# Patient Record
Sex: Female | Born: 1970 | Race: White | Hispanic: No | Marital: Married | Smoking: Former smoker
Health system: Southern US, Community
[De-identification: ages and names within clinical notes are randomized; demographics above are authoritative.]

## PROBLEM LIST (undated history)

## (undated) DIAGNOSIS — Z87442 Personal history of urinary calculi: Secondary | ICD-10-CM

## (undated) DIAGNOSIS — D649 Anemia, unspecified: Secondary | ICD-10-CM

## (undated) DIAGNOSIS — M1990 Unspecified osteoarthritis, unspecified site: Secondary | ICD-10-CM

## (undated) DIAGNOSIS — L719 Rosacea, unspecified: Secondary | ICD-10-CM

## (undated) DIAGNOSIS — U071 COVID-19: Secondary | ICD-10-CM

## (undated) DIAGNOSIS — R42 Dizziness and giddiness: Secondary | ICD-10-CM

## (undated) DIAGNOSIS — N183 Chronic kidney disease, stage 3 unspecified: Secondary | ICD-10-CM

## (undated) DIAGNOSIS — G43019 Migraine without aura, intractable, without status migrainosus: Secondary | ICD-10-CM

## (undated) DIAGNOSIS — I499 Cardiac arrhythmia, unspecified: Secondary | ICD-10-CM

## (undated) DIAGNOSIS — E039 Hypothyroidism, unspecified: Secondary | ICD-10-CM

## (undated) DIAGNOSIS — H269 Unspecified cataract: Secondary | ICD-10-CM

## (undated) DIAGNOSIS — Z8489 Family history of other specified conditions: Secondary | ICD-10-CM

## (undated) DIAGNOSIS — Z9884 Bariatric surgery status: Secondary | ICD-10-CM

## (undated) DIAGNOSIS — I10 Essential (primary) hypertension: Secondary | ICD-10-CM

## (undated) DIAGNOSIS — M797 Fibromyalgia: Secondary | ICD-10-CM

## (undated) DIAGNOSIS — E785 Hyperlipidemia, unspecified: Secondary | ICD-10-CM

## (undated) DIAGNOSIS — Z8742 Personal history of other diseases of the female genital tract: Secondary | ICD-10-CM

## (undated) DIAGNOSIS — T753XXA Motion sickness, initial encounter: Secondary | ICD-10-CM

## (undated) DIAGNOSIS — F419 Anxiety disorder, unspecified: Secondary | ICD-10-CM

## (undated) DIAGNOSIS — N809 Endometriosis, unspecified: Secondary | ICD-10-CM

## (undated) DIAGNOSIS — N200 Calculus of kidney: Secondary | ICD-10-CM

## (undated) DIAGNOSIS — F329 Major depressive disorder, single episode, unspecified: Secondary | ICD-10-CM

## (undated) HISTORY — DX: Endometriosis, unspecified: N80.9

## (undated) HISTORY — DX: Morbid (severe) obesity due to excess calories: E66.01

## (undated) HISTORY — DX: Calculus of kidney: N20.0

## (undated) HISTORY — PX: ROTATOR CUFF REPAIR: SHX139

## (undated) HISTORY — DX: COVID-19: U07.1

## (undated) HISTORY — DX: Rosacea, unspecified: L71.9

## (undated) HISTORY — DX: Dizziness and giddiness: R42

## (undated) HISTORY — DX: Major depressive disorder, single episode, unspecified: F32.9

## (undated) HISTORY — PX: EYE SURGERY: SHX253

## (undated) HISTORY — DX: Migraine without aura, intractable, without status migrainosus: G43.019

## (undated) HISTORY — DX: Bariatric surgery status: Z98.84

## (undated) HISTORY — PX: OTHER SURGICAL HISTORY: SHX169

## (undated) HISTORY — DX: Anemia, unspecified: D64.9

## (undated) HISTORY — DX: Anxiety disorder, unspecified: F41.9

## (undated) HISTORY — PX: OVARIAN CYST REMOVAL: SHX89

## (undated) HISTORY — DX: Hyperlipidemia, unspecified: E78.5

## (undated) HISTORY — DX: Unspecified cataract: H26.9

## (undated) HISTORY — DX: Personal history of other diseases of the female genital tract: Z87.42

---

## 2006-12-05 ENCOUNTER — Ambulatory Visit: Payer: Self-pay | Admitting: Internal Medicine

## 2006-12-11 ENCOUNTER — Ambulatory Visit: Payer: Self-pay | Admitting: Internal Medicine

## 2007-01-11 ENCOUNTER — Ambulatory Visit: Payer: Self-pay | Admitting: Internal Medicine

## 2007-02-09 ENCOUNTER — Ambulatory Visit: Payer: Self-pay | Admitting: Internal Medicine

## 2007-03-12 ENCOUNTER — Ambulatory Visit: Payer: Self-pay | Admitting: Internal Medicine

## 2007-03-26 ENCOUNTER — Ambulatory Visit: Payer: Self-pay | Admitting: Internal Medicine

## 2007-03-29 ENCOUNTER — Ambulatory Visit: Payer: Self-pay | Admitting: Urology

## 2007-03-29 ENCOUNTER — Observation Stay: Payer: Self-pay | Admitting: Urology

## 2007-04-01 ENCOUNTER — Ambulatory Visit: Payer: Self-pay | Admitting: Urology

## 2007-04-04 ENCOUNTER — Ambulatory Visit: Payer: Self-pay | Admitting: Urology

## 2007-04-11 ENCOUNTER — Ambulatory Visit: Payer: Self-pay | Admitting: Internal Medicine

## 2007-04-25 ENCOUNTER — Ambulatory Visit: Payer: Self-pay | Admitting: Internal Medicine

## 2007-05-12 ENCOUNTER — Ambulatory Visit: Payer: Self-pay | Admitting: Internal Medicine

## 2007-06-11 ENCOUNTER — Ambulatory Visit: Payer: Self-pay | Admitting: Internal Medicine

## 2007-06-27 ENCOUNTER — Ambulatory Visit: Payer: Self-pay | Admitting: Internal Medicine

## 2007-07-12 ENCOUNTER — Ambulatory Visit: Payer: Self-pay | Admitting: Internal Medicine

## 2007-08-12 ENCOUNTER — Ambulatory Visit: Payer: Self-pay | Admitting: Internal Medicine

## 2007-08-26 ENCOUNTER — Ambulatory Visit: Payer: Self-pay | Admitting: Internal Medicine

## 2007-08-28 ENCOUNTER — Ambulatory Visit: Payer: Self-pay | Admitting: Unknown Physician Specialty

## 2007-09-11 ENCOUNTER — Ambulatory Visit: Payer: Self-pay | Admitting: Internal Medicine

## 2007-10-12 ENCOUNTER — Ambulatory Visit: Payer: Self-pay | Admitting: Internal Medicine

## 2007-12-19 ENCOUNTER — Ambulatory Visit: Payer: Self-pay | Admitting: Rheumatology

## 2007-12-20 ENCOUNTER — Ambulatory Visit: Payer: Self-pay | Admitting: Internal Medicine

## 2008-01-12 ENCOUNTER — Ambulatory Visit: Payer: Self-pay | Admitting: Internal Medicine

## 2008-01-20 ENCOUNTER — Ambulatory Visit: Payer: Self-pay | Admitting: Internal Medicine

## 2008-02-09 ENCOUNTER — Ambulatory Visit: Payer: Self-pay | Admitting: Internal Medicine

## 2008-03-11 ENCOUNTER — Ambulatory Visit: Payer: Self-pay | Admitting: Internal Medicine

## 2008-03-12 ENCOUNTER — Ambulatory Visit: Payer: Self-pay | Admitting: Orthopaedic Surgery

## 2008-03-12 ENCOUNTER — Other Ambulatory Visit: Payer: Self-pay

## 2008-03-19 ENCOUNTER — Ambulatory Visit: Payer: Self-pay | Admitting: Orthopaedic Surgery

## 2008-06-10 ENCOUNTER — Ambulatory Visit: Payer: Self-pay | Admitting: Internal Medicine

## 2008-06-16 ENCOUNTER — Ambulatory Visit: Payer: Self-pay | Admitting: Internal Medicine

## 2008-07-02 ENCOUNTER — Encounter: Payer: Self-pay | Admitting: Maternal & Fetal Medicine

## 2008-07-11 ENCOUNTER — Ambulatory Visit: Payer: Self-pay | Admitting: Internal Medicine

## 2008-08-11 ENCOUNTER — Ambulatory Visit: Payer: Self-pay | Admitting: Internal Medicine

## 2008-12-11 DIAGNOSIS — F32A Depression, unspecified: Secondary | ICD-10-CM

## 2008-12-11 DIAGNOSIS — Z9884 Bariatric surgery status: Secondary | ICD-10-CM

## 2008-12-11 HISTORY — DX: Depression, unspecified: F32.A

## 2008-12-11 HISTORY — DX: Bariatric surgery status: Z98.84

## 2009-05-08 ENCOUNTER — Emergency Department: Payer: Self-pay

## 2009-05-11 ENCOUNTER — Ambulatory Visit: Payer: Self-pay | Admitting: Internal Medicine

## 2009-06-09 ENCOUNTER — Ambulatory Visit: Payer: Self-pay | Admitting: Internal Medicine

## 2009-06-10 ENCOUNTER — Ambulatory Visit: Payer: Self-pay | Admitting: Internal Medicine

## 2009-11-22 ENCOUNTER — Ambulatory Visit: Payer: Self-pay | Admitting: Internal Medicine

## 2009-12-11 HISTORY — PX: DILATION AND CURETTAGE OF UTERUS: SHX78

## 2010-01-20 ENCOUNTER — Ambulatory Visit: Payer: Self-pay | Admitting: Unknown Physician Specialty

## 2010-01-28 ENCOUNTER — Ambulatory Visit: Payer: Self-pay | Admitting: Unknown Physician Specialty

## 2010-05-25 ENCOUNTER — Emergency Department: Payer: Self-pay | Admitting: Emergency Medicine

## 2011-05-04 ENCOUNTER — Ambulatory Visit: Payer: Self-pay

## 2011-05-07 ENCOUNTER — Emergency Department: Payer: Self-pay | Admitting: Emergency Medicine

## 2011-08-28 ENCOUNTER — Other Ambulatory Visit: Payer: Self-pay | Admitting: *Deleted

## 2011-09-04 ENCOUNTER — Other Ambulatory Visit: Payer: Self-pay | Admitting: Internal Medicine

## 2011-09-06 MED ORDER — TRAMADOL HCL 50 MG PO TABS: 50.0000 mg | ORAL_TABLET | Freq: Four times a day (QID) | ORAL | Status: DC | PRN

## 2011-09-14 ENCOUNTER — Ambulatory Visit: Payer: Self-pay | Admitting: Unknown Physician Specialty

## 2011-11-06 ENCOUNTER — Other Ambulatory Visit: Payer: Self-pay | Admitting: Internal Medicine

## 2011-11-06 MED ORDER — PROMETHAZINE HCL 25 MG RE SUPP: 25.0000 mg | Freq: Three times a day (TID) | RECTAL | Status: DC | PRN

## 2011-11-06 NOTE — Telephone Encounter (Signed)
Refill authorizied on promethazine

## 2011-11-27 ENCOUNTER — Other Ambulatory Visit: Payer: Self-pay | Admitting: *Deleted

## 2011-11-28 MED ORDER — TRAMADOL HCL 50 MG PO TABS: 50.0000 mg | ORAL_TABLET | Freq: Four times a day (QID) | ORAL | Status: DC | PRN

## 2011-12-09 ENCOUNTER — Ambulatory Visit: Payer: Self-pay | Admitting: Internal Medicine

## 2011-12-25 ENCOUNTER — Other Ambulatory Visit: Payer: Self-pay | Admitting: *Deleted

## 2011-12-25 MED ORDER — METHOCARBAMOL 500 MG PO TABS: 500.0000 mg | ORAL_TABLET | Freq: Three times a day (TID) | ORAL | Status: AC

## 2011-12-25 NOTE — Telephone Encounter (Signed)
Faxed request from [facility], last filled 06/26/11.

## 2012-01-02 ENCOUNTER — Other Ambulatory Visit: Payer: Self-pay | Admitting: *Deleted

## 2012-01-02 NOTE — Telephone Encounter (Signed)
She is overdue for cmet, fasting lipids and hgba1c.  30 day supply only. If we have contact information

## 2012-01-02 NOTE — Telephone Encounter (Signed)
Faxed request from [facility].  Pt has not been seen in this office and has no upcoming appts.

## 2012-01-03 MED ORDER — METFORMIN HCL 500 MG PO TABS: 500.0000 mg | ORAL_TABLET | Freq: Three times a day (TID) | ORAL | Status: DC

## 2012-01-03 NOTE — Telephone Encounter (Signed)
Medicine called to [facility].  Advised pharmacist that pt will need office visit before further refills.  Left message on the only number we have in her chart for her to call back.

## 2012-01-22 ENCOUNTER — Telehealth: Payer: Self-pay | Admitting: Internal Medicine

## 2012-01-22 DIAGNOSIS — E119 Type 2 diabetes mellitus without complications: Secondary | ICD-10-CM | POA: Insufficient documentation

## 2012-01-22 MED ORDER — METFORMIN HCL 500 MG PO TABS: 500.0000 mg | ORAL_TABLET | Freq: Three times a day (TID) | ORAL | Status: DC

## 2012-01-22 NOTE — Telephone Encounter (Signed)
Needing another refill on her metformin 500 mg until her office visit on March 6.

## 2012-01-22 NOTE — Telephone Encounter (Signed)
Addended by: Duncan Dull on: 01/22/2012 01:15 PM   Modules accepted: Orders

## 2012-02-05 ENCOUNTER — Encounter: Payer: Self-pay | Admitting: Internal Medicine

## 2012-02-05 ENCOUNTER — Ambulatory Visit (INDEPENDENT_AMBULATORY_CARE_PROVIDER_SITE_OTHER): Payer: BC Managed Care – PPO | Admitting: Internal Medicine

## 2012-02-05 DIAGNOSIS — G4733 Obstructive sleep apnea (adult) (pediatric): Secondary | ICD-10-CM | POA: Insufficient documentation

## 2012-02-05 DIAGNOSIS — G43909 Migraine, unspecified, not intractable, without status migrainosus: Secondary | ICD-10-CM

## 2012-02-05 DIAGNOSIS — G4730 Sleep apnea, unspecified: Secondary | ICD-10-CM

## 2012-02-05 DIAGNOSIS — E119 Type 2 diabetes mellitus without complications: Secondary | ICD-10-CM

## 2012-02-05 MED ORDER — HYDROCODONE-ACETAMINOPHEN 7.5-750 MG PO TABS: 1.0000 | ORAL_TABLET | Freq: Four times a day (QID) | ORAL | Status: AC | PRN

## 2012-02-05 MED ORDER — VERAPAMIL HCL ER 120 MG PO TBCR: 120.0000 mg | EXTENDED_RELEASE_TABLET | Freq: Every day | ORAL | Status: DC

## 2012-02-05 MED ORDER — METFORMIN HCL 500 MG PO TABS: 1000.0000 mg | ORAL_TABLET | Freq: Two times a day (BID) | ORAL | Status: DC

## 2012-02-05 MED ORDER — METOPROLOL TARTRATE 50 MG PO TABS: 50.0000 mg | ORAL_TABLET | Freq: Two times a day (BID) | ORAL | Status: DC

## 2012-02-05 NOTE — Assessment & Plan Note (Signed)
Persistent  With no prior trial of verapamil.  Will first increase the metoprolol tartrate to twice daily and if no improvement,  Decrease to once daily and add bedtime verapamil.

## 2012-02-05 NOTE — Progress Notes (Signed)
Subjective:    Patient ID: Maria Bentley, female    DOB: [birth year], 41 y.o.   MRN: [id number]  HPI 41 yr old white female with tory of  DM, chronic migraines, morbid obesity s/p gastric bypass  Presents with persistent migraines. She has tried some  preventive therapy via neurologist Dohmeier,  Current migraine has been continuous for one week.  The imitrex works temporarily but wears off in 6 hours .  Previous trial of propranolol failed.  Discussed topomax.   Previous tral of topomax required dose titration so high that  it caused word finding difficulties. To make matters more complicated, she is undergoing fertility treatments with her husband to conceive at [facility].   History reviewed. No pertinent past medical history. Current Outpatient Prescriptions on File Prior to Visit  Medication Sig Dispense Refill  . traMADol (ULTRAM) 50 MG tablet Take 1 tablet (50 mg total) by mouth every 6 (six) hours as needed for pain.  90 tablet  3    Review of Systems  Constitutional: Negative for fever, chills and unexpected weight change.  HENT: Negative for hearing loss, ear pain, nosebleeds, congestion, sore throat, facial swelling, rhinorrhea, sneezing, mouth sores, trouble swallowing, neck pain, neck stiffness, voice change, postnasal drip, sinus pressure, tinnitus and ear discharge.   Eyes: Negative for pain, discharge, redness and visual disturbance.  Respiratory: Negative for cough, chest tightness, shortness of breath, wheezing and stridor.   Cardiovascular: Negative for chest pain, palpitations and leg swelling.  Musculoskeletal: Negative for myalgias and arthralgias.  Skin: Negative for color change and rash.  Neurological: Positive for headaches. Negative for dizziness, weakness and light-headedness.  Hematological: Negative for adenopathy.      Objective:   Physical Exam  Constitutional: She is oriented to person, place, and time. She appears well-developed and  well-nourished.  HENT:  Mouth/Throat: Oropharynx is clear and moist.  Eyes: EOM are normal. Pupils are equal, round, and reactive to light. No scleral icterus.  Neck: Normal range of motion. Neck supple. No JVD present. No thyromegaly present.  Cardiovascular: Normal rate, regular rhythm, normal heart sounds and intact distal pulses.   Pulmonary/Chest: Effort normal and breath sounds normal.  Abdominal: Soft. Bowel sounds are normal. She exhibits no mass. There is no tenderness.  Musculoskeletal: Normal range of motion. She exhibits no edema.  Lymphadenopathy:    She has no cervical adenopathy.  Neurological: She is alert and oriented to person, place, and time.  Skin: Skin is warm and dry.  Psychiatric: She has a normal mood and affect.      Assessment & Plan:   Diabetes mellitus Last a1c n spet was < 7.0.   checking today,  taking metformin 1500 mg daily   Migraine Persistent  With no prior trial of verapamil.  Will first increase the metoprolol tartrate to twice daily and if no improvement,  Decrease to once daily and add bedtime verapamil.    Updated Medication List Outpatient Encounter Prescriptions as of 02/05/2012  Medication Sig Dispense Refill  . acetaminophen (TYLENOL) 325 MG tablet Take 650 mg by mouth every 6 (six) hours as needed.      Marland Kitchen buPROPion (WELLBUTRIN XL) 300 MG 24 hr tablet Take 300 mg by mouth daily.      . Cholecalciferol (VITAMIN D3) 2000 UNITS TABS Take by mouth.      . folic acid (FOLVITE) 1 MG tablet Take 1 mg by mouth daily.      . furosemide (LASIX) 20 MG tablet Take  20 mg by mouth 2 (two) times daily.      Marland Kitchen gabapentin (NEURONTIN) 300 MG capsule Take 300 mg by mouth 3 (three) times daily.      Marland Kitchen lamoTRIgine (LAMICTAL) 150 MG tablet Take 150 mg by mouth daily.      . meclizine (ANTIVERT) 25 MG tablet Take 25 mg by mouth 3 (three) times daily as needed.      . metFORMIN (GLUCOPHAGE) 500 MG tablet Take 2 tablets (1,000 mg total) by mouth 2 (two) times  daily with a meal.  120 tablet  2  . methocarbamol (ROBAXIN) 500 MG tablet Take 500 mg by mouth 4 (four) times daily.      . metoprolol (LOPRESSOR) 50 MG tablet Take 1 tablet (50 mg total) by mouth 2 (two) times daily.  60 tablet  2  . Prenatal Vit-Fe Fumarate-FA (PRENATAL MULTIVITAMIN) TABS Take 1 tablet by mouth daily.      . SUMAtriptan (IMITREX) 100 MG tablet Take 100 mg by mouth every 2 (two) hours as needed.      . traMADol (ULTRAM) 50 MG tablet Take 1 tablet (50 mg total) by mouth every 6 (six) hours as needed for pain.  90 tablet  3  . DISCONTD: metFORMIN (GLUCOPHAGE) 500 MG tablet Take 1 tablet (500 mg total) by mouth 3 (three) times daily.  90 tablet  2  . DISCONTD: metoprolol (LOPRESSOR) 50 MG tablet Take 50 mg by mouth 2 (two) times daily.      Marland Kitchen HYDROcodone-acetaminophen (VICODIN ES) 7.5-750 MG per tablet Take 1 tablet by mouth every 6 (six) hours as needed for pain.  60 tablet  0  . verapamil (CALAN-SR) 120 MG CR tablet Take 1 tablet (120 mg total) by mouth at bedtime.  30 tablet  2

## 2012-02-05 NOTE — Assessment & Plan Note (Addendum)
Last a1c n spet was < 7.0.   checking today,  taking metformin 1500 mg daily

## 2012-02-05 NOTE — Patient Instructions (Addendum)
We are trying ES vicodin and increasing your memtoprolol to two times daily for your headaches and blood pressure..  If there is no  Difference in one week to your headaches you may return to once daily on the metoprolol and start the bedtime  verapamil for prevention  Sign up for E chart tonight so i can lease hgba1c to you

## 2012-02-06 LAB — COMPLETE METABOLIC PANEL WITHOUT GFR
ALT: 15 U/L (ref 0–35)
AST: 14 U/L (ref 0–37)
Albumin: 4.3 g/dL (ref 3.5–5.2)
Alkaline Phosphatase: 70 U/L (ref 39–117)
BUN: 11 mg/dL (ref 6–23)
CO2: 28 meq/L (ref 19–32)
Calcium: 10 mg/dL (ref 8.4–10.5)
Chloride: 100 meq/L (ref 96–112)
Creat: 0.76 mg/dL (ref 0.50–1.10)
GFR, Est African American: >89 mL/min
GFR, Est Non African American: >89 mL/min
Glucose, Bld: 111 mg/dL — ABNORMAL HIGH (ref 70–99)
Potassium: 4.4 meq/L (ref 3.5–5.3)
Sodium: 139 meq/L (ref 135–145)
Total Bilirubin: 0.3 mg/dL (ref 0.3–1.2)
Total Protein: 6.8 g/dL (ref 6.0–8.3)

## 2012-02-06 LAB — HEMOGLOBIN A1C: Hgb A1c MFr Bld: 8.7 % — ABNORMAL HIGH (ref 4.6–6.5)

## 2012-02-07 MED ORDER — GLIPIZIDE 5 MG PO TABS: 5.0000 mg | ORAL_TABLET | Freq: Two times a day (BID) | ORAL | Status: DC

## 2012-02-07 NOTE — Progress Notes (Signed)
Addended by: Duncan Dull on: 02/07/2012 10:56 PM   Modules accepted: Orders

## 2012-02-14 ENCOUNTER — Ambulatory Visit (INDEPENDENT_AMBULATORY_CARE_PROVIDER_SITE_OTHER): Payer: BC Managed Care – PPO | Admitting: Internal Medicine

## 2012-02-14 ENCOUNTER — Encounter: Payer: Self-pay | Admitting: Internal Medicine

## 2012-02-14 DIAGNOSIS — G43909 Migraine, unspecified, not intractable, without status migrainosus: Secondary | ICD-10-CM

## 2012-02-14 DIAGNOSIS — E119 Type 2 diabetes mellitus without complications: Secondary | ICD-10-CM

## 2012-02-14 MED ORDER — GABAPENTIN 300 MG PO CAPS: 300.0000 mg | ORAL_CAPSULE | Freq: Three times a day (TID) | ORAL | Status: DC

## 2012-02-14 MED ORDER — POTASSIUM CHLORIDE ER 10 MEQ PO TBCR: 20.0000 meq | EXTENDED_RELEASE_TABLET | Freq: Every day | ORAL | Status: DC

## 2012-02-14 MED ORDER — FLUCONAZOLE 150 MG PO TABS: 150.0000 mg | ORAL_TABLET | Freq: Every day | ORAL | Status: AC

## 2012-02-14 MED ORDER — METHOCARBAMOL 500 MG PO TABS: 500.0000 mg | ORAL_TABLET | Freq: Four times a day (QID) | ORAL | Status: DC

## 2012-02-14 MED ORDER — SUMATRIPTAN SUCCINATE 100 MG PO TABS: 100.0000 mg | ORAL_TABLET | ORAL | Status: DC | PRN

## 2012-02-14 MED ORDER — TRAMADOL HCL 50 MG PO TABS: 50.0000 mg | ORAL_TABLET | Freq: Four times a day (QID) | ORAL | Status: DC | PRN

## 2012-02-14 MED ORDER — METRONIDAZOLE 0.75 % EX CREA: TOPICAL_CREAM | Freq: Two times a day (BID) | CUTANEOUS | Status: AC

## 2012-02-14 MED ORDER — MECLIZINE HCL 25 MG PO TABS: 25.0000 mg | ORAL_TABLET | Freq: Three times a day (TID) | ORAL | Status: DC | PRN

## 2012-02-14 MED ORDER — PROMETHAZINE HCL 25 MG RE SUPP: 25.0000 mg | Freq: Four times a day (QID) | RECTAL | Status: DC | PRN

## 2012-02-14 MED ORDER — METOPROLOL SUCCINATE ER 50 MG PO TB24: 50.0000 mg | ORAL_TABLET | Freq: Every day | ORAL | Status: DC

## 2012-02-14 MED ORDER — FUROSEMIDE 20 MG PO TABS: 20.0000 mg | ORAL_TABLET | Freq: Every day | ORAL | Status: DC | PRN

## 2012-02-14 NOTE — Progress Notes (Signed)
Subjective:     Maria Bentley is a 41 y.o. female and is here for a comprehensive physical exam. The patient reports improvement in headaches since last week with addition of Cardizem.  She had a normal PAP smear and breast exam by her OBGYN in November 2012.   History   Social History  . Marital Status: Married    Spouse Name: N/A    Number of Children: N/A  . Years of Education: N/A   Occupational History  . Not on file.   Social History Main Topics  . Smoking status: Former Smoker    Quit date: 02/05/1996  . Smokeless tobacco: Never Used  . Alcohol Use: No  . Drug Use: No  . Sexually Active: Not on file   Other Topics Concern  . Not on file   Social History Narrative  . No narrative on file   Health Maintenance  Topic Date Due  . Pap Smear  07/15/1988  . Tetanus/tdap  07/15/1989  . Influenza Vaccine  09/11/2011    The following portions of the patient's history were reviewed and updated as appropriate: allergies, current medications, past family history, past medical history, past social history, past surgical history and problem list.  Review of Systems A comprehensive review of systems was negative.   Objective:    BP 120/80  Pulse 98  Temp(Src) 98 F (36.7 C) (Oral)  Resp 18  Ht 5\' 4"  (1.626 m)  Wt 198 lb 12 oz (90.152 kg)  BMI 34.12 kg/m2  SpO2 95%  LMP 02/05/2012 General appearance: alert, cooperative and appears stated age Head: Normocephalic, without obvious abnormality, atraumatic Eyes: conjunctivae/corneas clear. PERRL, EOM's intact. Fundi benign. Neck: no adenopathy, no carotid bruit, no JVD, supple, symmetrical, trachea midline and thyroid not enlarged, symmetric, no tenderness/mass/nodules Back: symmetric, no curvature. ROM normal. No CVA tenderness. Lungs: clear to auscultation bilaterally Heart: regular rate and rhythm, S1, S2 normal, no murmur, click, rub or gallop Abdomen: soft, non-tender; bowel sounds normal; no masses,  no  organomegaly Pulses: 2+ and symmetric Skin: Skin color, texture, turgor normal. No rashes or lesions Neurologic: Alert and oriented X 3, normal strength and tone. Normal symmetric reflexes. Normal coordination and gait    Assessment:    Healthy female exam.    Plan:     Migraine So far improved with addition of calcium channel blocker.  History of prior high dose Topamax caused memory loss and word finding difficulties.   Diabetes mellitus Recent loss of control as evidenced by HgbAic discussed. Discussed low glycemic index diet and use of 6 smaller meals daily , which is already in place due to gastric bypass  History.    Updated Medication List Outpatient Encounter Prescriptions as of 02/14/2012  Medication Sig Dispense Refill  . acetaminophen (TYLENOL) 325 MG tablet Take 650 mg by mouth every 6 (six) hours as needed.      Marland Kitchen buPROPion (WELLBUTRIN XL) 300 MG 24 hr tablet Take 300 mg by mouth daily.      . Cholecalciferol (VITAMIN D3) 2000 UNITS TABS Take by mouth.      . folic acid (FOLVITE) 1 MG tablet Take 1 mg by mouth daily.      . furosemide (LASIX) 20 MG tablet Take 1 tablet (20 mg total) by mouth daily as needed.  90 tablet  3  . gabapentin (NEURONTIN) 300 MG capsule Take 1 capsule (300 mg total) by mouth 3 (three) times daily.  90 capsule  3  . glipiZIDE (  GLUCOTROL) 5 MG tablet Take 1 tablet (5 mg total) by mouth 2 (two) times daily before a meal.  60 tablet  3  . HYDROcodone-acetaminophen (VICODIN ES) 7.5-750 MG per tablet Take 1 tablet by mouth every 6 (six) hours as needed for pain.  60 tablet  0  . lamoTRIgine (LAMICTAL) 150 MG tablet Take 150 mg by mouth daily.      . meclizine (ANTIVERT) 25 MG tablet Take 1 tablet (25 mg total) by mouth 3 (three) times daily as needed.  90 tablet  6  . metFORMIN (GLUCOPHAGE) 500 MG tablet Take 2 tablets (1,000 mg total) by mouth 2 (two) times daily with a meal.  120 tablet  2  . methocarbamol (ROBAXIN) 500 MG tablet Take 1 tablet (500  mg total) by mouth 4 (four) times daily.  90 tablet  5  . Prenatal Vit-Fe Fumarate-FA (PRENATAL MULTIVITAMIN) TABS Take 1 tablet by mouth daily.      . SUMAtriptan (IMITREX) 100 MG tablet Take 1 tablet (100 mg total) by mouth every 2 (two) hours as needed.  9 tablet  11  . traMADol (ULTRAM) 50 MG tablet Take 1 tablet (50 mg total) by mouth every 6 (six) hours as needed for pain.  90 tablet  3  . verapamil (CALAN-SR) 120 MG CR tablet Take 1 tablet (120 mg total) by mouth at bedtime.  30 tablet  2  . DISCONTD: furosemide (LASIX) 20 MG tablet Take 20 mg by mouth 2 (two) times daily.      Marland Kitchen DISCONTD: gabapentin (NEURONTIN) 300 MG capsule Take 300 mg by mouth 3 (three) times daily.      Marland Kitchen DISCONTD: meclizine (ANTIVERT) 25 MG tablet Take 25 mg by mouth 3 (three) times daily as needed.      Marland Kitchen DISCONTD: methocarbamol (ROBAXIN) 500 MG tablet Take 500 mg by mouth 4 (four) times daily.      Marland Kitchen DISCONTD: metoprolol (LOPRESSOR) 50 MG tablet Take 1 tablet (50 mg total) by mouth 2 (two) times daily.  60 tablet  2  . DISCONTD: SUMAtriptan (IMITREX) 100 MG tablet Take 100 mg by mouth every 2 (two) hours as needed.      Marland Kitchen DISCONTD: traMADol (ULTRAM) 50 MG tablet Take 1 tablet (50 mg total) by mouth every 6 (six) hours as needed for pain.  90 tablet  3  . fluconazole (DIFLUCAN) 150 MG tablet Take 1 tablet (150 mg total) by mouth daily. For maximum 3 days as needed  9 tablet  3  . metoprolol succinate (TOPROL-XL) 50 MG 24 hr tablet Take 1 tablet (50 mg total) by mouth daily. Take with or immediately following a meal.  90 tablet  3  . metroNIDAZOLE (METROCREAM) 0.75 % cream Apply topically 2 (two) times daily.  45 g  3  . potassium chloride (K-DUR) 10 MEQ tablet Take 2 tablets (20 mEq total) by mouth daily.  90 tablet  3  . promethazine (PHENERGAN) 25 MG suppository Place 1 suppository (25 mg total) rectally every 6 (six) hours as needed for nausea.  12 each  3

## 2012-02-14 NOTE — Patient Instructions (Signed)
Low glycemic index diet, eating 6 smaller meals daily  Protein  Shakes (EAS Carb Control  Or Atkins ,  Available everywhere,   In  cases at [employer] )  2.5 carbs  Protein bar by Atkins (snack size,  [employer])  At 10 am  A couple of solutions to the bread :    Joseph's makes a pita bread and a flat bread ,  available at [facility] and [employer];  50 cal ,   4 net carbs )   Toufayan makes a high fiber low carb wrap

## 2012-02-15 NOTE — Assessment & Plan Note (Signed)
Recent loss of control as evidenced by HgbAic discussed. Discussed low glycemic index diet and use of 6 smaller meals daily , which is already in place due to gastric bypass  History.

## 2012-02-15 NOTE — Assessment & Plan Note (Signed)
So far improved with addition of calcium channel blocker.  History of prior high dose Topamax caused memory loss and word finding difficulties.

## 2012-02-21 ENCOUNTER — Encounter: Payer: Self-pay | Admitting: Internal Medicine

## 2012-02-26 ENCOUNTER — Telehealth: Payer: Self-pay | Admitting: *Deleted

## 2012-02-26 NOTE — Telephone Encounter (Signed)
Triage Record Num: [id number] Operator: Chevis Pretty Patient Name: Maria Bentley Call Date & Time: 02/26/2012 9:23:15AM Patient Phone: [phone] PCP: Duncan Dull Patient Gender: Female PCP Fax : [phone] Patient DOB: [birth year] Practice Name: [facility] Reason for Call: Caller: Shayda/Patient; PCP: Duncan Dull; CB#: [phone]; ; ; Call regarding Earache, Tonsils Swollen; Cold Symtpoms X. 4. Days; afebrile. States gland is swollen on the L side of the neck as well. L outer ear is mildly swollen and red. Per protocol, advised appt within 4 hours; no appts available per Epic. TC to office; referred to urgent care. Patient declines UC, as her copay is too expensive; appt sched 0915 02/27/12 with Dr. Darrick Huntsman. Protocol(s) Used: Ear: Symptoms Recommended Outcome per Protocol: See Provider within 4 hours Reason for Outcome: Swelling, tenderness, OR redness of visible portion of outer ear Care Advice: A warm washcloth or heating pad set on low to the affected ear may help relieve the discomfort. May apply for 15 to 20 minutes, 3 to 4 times a day. ~ ~ Call provider if symptoms worsen or new symptoms develop. During pregnancy, call provider if temperature is 100 F (37.7 C) or greater OR any temperature elevation for 3 days even while taking acetaminophen. ~ ~ Do not use eardrops unless directed by a healthcare provider, especially if having ear drainage. Keep ear canal as dry as possible. Take a bath instead of showering. Try to keep water out of ear when washing hair by placing cotton balls in ear opening. Avoid swimming or water sports until [place] by provider. ~ ~ When ear is draining, wipe away the material as it drains. Do not try to clean ear canal. ~ SYMPTOM / CONDITION MANAGEMENT ~ CAUTIONS Analgesic/Antipyretic Advice - Acetaminophen: Consider acetaminophen as directed on label or by pharmacist/provider for pain or fever PRECAUTIONS: - Use if there  is no history of liver disease, alcoholism, or intake of three or more alcohol drinks per day - Only if approved by provider during pregnancy or when breastfeeding - During pregnancy, acetaminophen should not be taken more than 3 consecutive days without telling provider - Do not exceed recommended dose or frequency ~ Analgesic/Antipyretic Advice - NSAIDs: Consider aspirin, ibuprofen, naproxen or ketoprofen for pain or fever as directed on label or by pharmacist/provider. PRECAUTIONS: - If over 57 years of age, should not take longer than 1 week without consulting provider. EXCEPTIONS: - Should not be used if taking blood thinners or have bleeding problems. - Do not use if have history of sensitivity/allergy to any of these medications; or history of cardiovascular, ulcer, kidney, liver disease or diabetes unless approved by provider. - Do not exceed recommended dose or frequency. ~ 02/26/2012 9:40:21AM Page 1 of 1 [id number]

## 2012-02-27 ENCOUNTER — Ambulatory Visit (INDEPENDENT_AMBULATORY_CARE_PROVIDER_SITE_OTHER): Payer: BC Managed Care – PPO | Admitting: Internal Medicine

## 2012-02-27 ENCOUNTER — Encounter: Payer: Self-pay | Admitting: Internal Medicine

## 2012-02-27 VITALS — BP 130/90 | HR 94 | Temp 98.1°F | Wt 198.4 lb

## 2012-02-27 DIAGNOSIS — E669 Obesity, unspecified: Secondary | ICD-10-CM | POA: Insufficient documentation

## 2012-02-27 DIAGNOSIS — E1169 Type 2 diabetes mellitus with other specified complication: Secondary | ICD-10-CM | POA: Insufficient documentation

## 2012-02-27 DIAGNOSIS — H6692 Otitis media, unspecified, left ear: Secondary | ICD-10-CM | POA: Insufficient documentation

## 2012-02-27 DIAGNOSIS — J329 Chronic sinusitis, unspecified: Secondary | ICD-10-CM

## 2012-02-27 DIAGNOSIS — F419 Anxiety disorder, unspecified: Secondary | ICD-10-CM | POA: Insufficient documentation

## 2012-02-27 DIAGNOSIS — E785 Hyperlipidemia, unspecified: Secondary | ICD-10-CM | POA: Insufficient documentation

## 2012-02-27 DIAGNOSIS — G43019 Migraine without aura, intractable, without status migrainosus: Secondary | ICD-10-CM | POA: Insufficient documentation

## 2012-02-27 DIAGNOSIS — H6690 Otitis media, unspecified, unspecified ear: Secondary | ICD-10-CM

## 2012-02-27 DIAGNOSIS — E039 Hypothyroidism, unspecified: Secondary | ICD-10-CM | POA: Insufficient documentation

## 2012-02-27 MED ORDER — HYDROCODONE-ACETAMINOPHEN 10-325 MG PO TABS: 1.0000 | ORAL_TABLET | Freq: Three times a day (TID) | ORAL | Status: AC | PRN

## 2012-02-27 MED ORDER — LEVOFLOXACIN 500 MG PO TABS: 500.0000 mg | ORAL_TABLET | Freq: Every day | ORAL | Status: AC

## 2012-02-27 MED ORDER — CLINDAMYCIN HCL 300 MG PO CAPS: 300.0000 mg | ORAL_CAPSULE | Freq: Three times a day (TID) | ORAL | Status: AC

## 2012-02-27 MED ORDER — METHYLPREDNISOLONE ACETATE 40 MG/ML IJ SUSP
40.0000 mg | Freq: Once | INTRAMUSCULAR | Status: AC
  Administered 2012-02-27: 40 mg via INTRAMUSCULAR

## 2012-02-27 MED ORDER — PREDNISONE (PAK) 10 MG PO TABS: ORAL_TABLET | ORAL | Status: AC

## 2012-02-27 NOTE — Assessment & Plan Note (Signed)
Secondary to prolonged sinus congestion. Will treat with Levaquin and clindamycin given her history of allergy to penicillin. And also started on prednisone given her a dose of IV IM Depo-Medrol here in the office. Also instructed her on how to perform sinus lavage with simply saline sterile solution and use of Sudafed to maintain open airways.

## 2012-02-27 NOTE — Progress Notes (Signed)
Patient ID: Maria Bentley, female   DOB: [birth year], 41 y.o.   MRN: [id number]   Patient Active Problem List  Diagnoses  . Migraine  . Sleep apnea  . Otitis media of left ear  . Diabetes mellitus  . Anxiety  . Headache, common migraine, intractable  . Hyperlipidemia    Subjective:  CC:   Chief Complaint  Patient presents with  . Sinusitis  . Otalgia    left - on set Sunday    HPI:   Maria Barse Timmonsis a 41 y.o. female who presents  With Sinus congestion which started one week ago,  for days ago her symptoms progressed to include maxillary sinus pain and purulent drainage from both nostrils accompanied by left ear pain and hearing loss. She has been taking taking sudafed and mucinex.  Subjective fevers but temp has been normal. Recurring persistent cough worse with  supine position.    Past Medical History  Diagnosis Date  . Diabetes mellitus   . Morbid obesity     s/p gastric bypass  . Anxiety   . Headache, common migraine, intractable   . Hyperlipidemia   . Thyroid disease     Past Surgical History  Procedure Date  . Gastric bypass        . methylPREDNISolone acetate  40 mg Intramuscular Once     The following portions of the patient's history were reviewed and updated as appropriate: Allergies, current medications, and problem list.    Review of Systems:   12 Pt  review of systems was negative except those addressed in the HPI,     History   Social History  . Marital Status: Married    Spouse Name: N/A    Number of Children: N/A  . Years of Education: N/A   Occupational History  . Not on file.   Social History Main Topics  . Smoking status: Former Smoker    Quit date: 02/05/1996  . Smokeless tobacco: Never Used  . Alcohol Use: No  . Drug Use: No  . Sexually Active: Not on file   Other Topics Concern  . Not on file   Social History Narrative  . No narrative on file    Objective:  BP 130/90  Pulse 94  Temp(Src)  98.1 F (36.7 C) (Oral)  Wt 198 lb 6.4 oz (89.994 kg)  LMP 02/05/2012  General appearance: alert, cooperative and appears stated age Ears: normal TM's and external ear canals both ears Throat: lips, mucosa, and tongue normal; teeth and gums normal Neck: no adenopathy, no carotid bruit, supple, symmetrical, trachea midline and thyroid not enlarged, symmetric, no tenderness/mass/nodules Back: symmetric, no curvature. ROM normal. No CVA tenderness. Lungs: clear to auscultation bilaterally Heart: regular rate and rhythm, S1, S2 normal, no murmur, click, rub or gallop Abdomen: soft, non-tender; bowel sounds normal; no masses,  no organomegaly Pulses: 2+ and symmetric Skin: Skin color, texture, turgor normal. No rashes or lesions Lymph nodes: Cervical, supraclavicular, and axillary nodes normal.  Assessment and Plan:  Otitis media of left ear Secondary to prolonged sinus congestion. Will treat with Levaquin and clindamycin given her history of allergy to penicillin. And also started on prednisone given her a dose of IV IM Depo-Medrol here in the office. Also instructed her on how to perform sinus lavage with simply saline sterile solution and use of Sudafed to maintain open airways.    Updated Medication List Outpatient Encounter Prescriptions as of 02/27/2012  Medication Sig Dispense Refill  . acetaminophen (  TYLENOL) 325 MG tablet Take 650 mg by mouth every 6 (six) hours as needed.      Marland Kitchen buPROPion (WELLBUTRIN XL) 300 MG 24 hr tablet Take 300 mg by mouth daily.      . Cholecalciferol (VITAMIN D3) 2000 UNITS TABS Take by mouth.      . folic acid (FOLVITE) 1 MG tablet Take 1 mg by mouth daily.      . furosemide (LASIX) 20 MG tablet Take 1 tablet (20 mg total) by mouth daily as needed.  90 tablet  3  . gabapentin (NEURONTIN) 300 MG capsule Take 1 capsule (300 mg total) by mouth 3 (three) times daily.  90 capsule  3  . glipiZIDE (GLUCOTROL) 5 MG tablet Take 1 tablet (5 mg total) by mouth 2  (two) times daily before a meal.  60 tablet  3  . lamoTRIgine (LAMICTAL) 150 MG tablet Take 150 mg by mouth daily.      . meclizine (ANTIVERT) 25 MG tablet Take 1 tablet (25 mg total) by mouth 3 (three) times daily as needed.  90 tablet  6  . metFORMIN (GLUCOPHAGE) 500 MG tablet Take 2 tablets (1,000 mg total) by mouth 2 (two) times daily with a meal.  120 tablet  2  . methocarbamol (ROBAXIN) 500 MG tablet Take 1 tablet (500 mg total) by mouth 4 (four) times daily.  90 tablet  5  . metoprolol succinate (TOPROL-XL) 50 MG 24 hr tablet Take 1 tablet (50 mg total) by mouth daily. Take with or immediately following a meal.  90 tablet  3  . metroNIDAZOLE (METROCREAM) 0.75 % cream Apply topically 2 (two) times daily.  45 g  3  . potassium chloride (K-DUR) 10 MEQ tablet Take 2 tablets (20 mEq total) by mouth daily.  90 tablet  3  . Prenatal Vit-Fe Fumarate-FA (PRENATAL MULTIVITAMIN) TABS Take 1 tablet by mouth daily.      . SUMAtriptan (IMITREX) 100 MG tablet Take 1 tablet (100 mg total) by mouth every 2 (two) hours as needed.  9 tablet  11  . traMADol (ULTRAM) 50 MG tablet Take 1 tablet (50 mg total) by mouth every 6 (six) hours as needed for pain.  90 tablet  3  . verapamil (CALAN-SR) 120 MG CR tablet Take 1 tablet (120 mg total) by mouth at bedtime.  30 tablet  2  . clindamycin (CLEOCIN) 300 MG capsule Take 1 capsule (300 mg total) by mouth 3 (three) times daily.  21 capsule  0  . HYDROcodone-acetaminophen (NORCO) 10-325 MG per tablet Take 1 tablet by mouth every 8 (eight) hours as needed for pain.  30 tablet  0  . levofloxacin (LEVAQUIN) 500 MG tablet Take 1 tablet (500 mg total) by mouth daily.  7 tablet  0  . predniSONE (STERAPRED UNI-PAK) 10 MG tablet 6 tablets on Day 1 , then reduce by 1 tablet daily until gone  21 tablet  0   Facility-Administered Encounter Medications as of 02/27/2012  Medication Dose Route Frequency Provider Last Rate Last Dose  . methylPREDNISolone acetate (DEPO-MEDROL)  injection 40 mg  40 mg Intramuscular Once Sherlene Shams, MD   40 mg at 02/27/12 1151     No orders of the defined types were placed in this encounter.    No Follow-up on file.

## 2012-02-27 NOTE — Patient Instructions (Signed)
You have a sinus/ear infection   .  I am prescribing an antibiotic (levaquin and clindamcyin) and prednisone taper  To manage the infectin and the inflammation in your ear/sinuses.   I also advise use of the following OTC meds to help with your other symptoms.   Take generic OTC benadryl 25 mg every 8 hours for the drainage,  Sudafed PE  10 to 30 mg every 8 hours for the congestion, you may substitute Afrin nasal spray for the nighttime dose of sudafed PE  If needed to prevent insomnia.  flushes your sinuses twice daily with Simply Saline (do over the sink because if you do it right you will spit out globs of mucus)  Use benzonatate capsules or OTC  Delsym   FOR THE COUGH.  Gargle with salt water as needed for sore throat.   Use vicodin for ear pain

## 2012-04-02 ENCOUNTER — Encounter: Payer: Self-pay | Admitting: Internal Medicine

## 2012-04-11 ENCOUNTER — Other Ambulatory Visit: Payer: Self-pay | Admitting: Internal Medicine

## 2012-04-12 MED ORDER — FLUTICASONE PROPIONATE 50 MCG/ACT NA SUSP: 2.0000 | Freq: Every day | NASAL | Status: DC

## 2012-04-20 ENCOUNTER — Other Ambulatory Visit: Payer: Self-pay | Admitting: Internal Medicine

## 2012-05-21 ENCOUNTER — Telehealth: Payer: Self-pay | Admitting: Internal Medicine

## 2012-05-21 ENCOUNTER — Encounter: Payer: Self-pay | Admitting: Internal Medicine

## 2012-05-21 NOTE — Telephone Encounter (Signed)
Emergent Call: Caller: Maria Bentley/Patient; PCP: Duncan Dull; CB#: [phone]; Call regarding Injury/Trauma; Injured L elbow 05/11/12 by striking elbow against door frame.  Afebrile.  FBS not tested.  Random blood sugar 67; so just took orange juice.  Notes > pain in elbow with movement and difficulty holding onto items d/t pain.  Trying to move at home and at work.  Office is nearly closed.  In [employer].  Advised to see [facility] within 4 hr for severe pain with movement that limits normal activities per Elbow Injury Guideline.

## 2012-05-24 ENCOUNTER — Ambulatory Visit: Payer: BC Managed Care – PPO | Admitting: Internal Medicine

## 2012-05-25 ENCOUNTER — Other Ambulatory Visit: Payer: Self-pay | Admitting: Internal Medicine

## 2012-06-03 ENCOUNTER — Other Ambulatory Visit: Payer: Self-pay | Admitting: Internal Medicine

## 2012-06-03 MED ORDER — GLIPIZIDE 5 MG PO TABS: 5.0000 mg | ORAL_TABLET | Freq: Two times a day (BID) | ORAL | Status: DC

## 2012-06-03 NOTE — Addendum Note (Signed)
Addended by: Duncan Dull on: 06/03/2012 12:42 PM   Modules accepted: Orders

## 2012-06-07 ENCOUNTER — Ambulatory Visit (INDEPENDENT_AMBULATORY_CARE_PROVIDER_SITE_OTHER): Payer: BC Managed Care – PPO | Admitting: Internal Medicine

## 2012-06-07 ENCOUNTER — Encounter: Payer: Self-pay | Admitting: Internal Medicine

## 2012-06-07 VITALS — BP 118/74 | HR 88 | Temp 98.0°F | Resp 16 | Wt 209.0 lb

## 2012-06-07 DIAGNOSIS — E119 Type 2 diabetes mellitus without complications: Secondary | ICD-10-CM

## 2012-06-07 DIAGNOSIS — G43019 Migraine without aura, intractable, without status migrainosus: Secondary | ICD-10-CM

## 2012-06-07 DIAGNOSIS — E1165 Type 2 diabetes mellitus with hyperglycemia: Secondary | ICD-10-CM

## 2012-06-07 DIAGNOSIS — IMO0002 Reserved for concepts with insufficient information to code with codable children: Secondary | ICD-10-CM

## 2012-06-07 DIAGNOSIS — IMO0001 Reserved for inherently not codable concepts without codable children: Secondary | ICD-10-CM

## 2012-06-07 DIAGNOSIS — E785 Hyperlipidemia, unspecified: Secondary | ICD-10-CM

## 2012-06-07 DIAGNOSIS — Z9884 Bariatric surgery status: Secondary | ICD-10-CM

## 2012-06-07 DIAGNOSIS — E669 Obesity, unspecified: Secondary | ICD-10-CM

## 2012-06-07 DIAGNOSIS — E1169 Type 2 diabetes mellitus with other specified complication: Secondary | ICD-10-CM

## 2012-06-07 DIAGNOSIS — E039 Hypothyroidism, unspecified: Secondary | ICD-10-CM

## 2012-06-07 LAB — MAGNESIUM: Magnesium: 1.8 mg/dL (ref 1.5–2.5)

## 2012-06-07 LAB — LIPID PANEL
Cholesterol: 209 mg/dL — ABNORMAL HIGH (ref 0–200)
Total CHOL/HDL Ratio: 4
Triglycerides: 102 mg/dL (ref 0.0–149.0)
VLDL: 20.4 mg/dL (ref 0.0–40.0)

## 2012-06-07 LAB — LDL CHOLESTEROL, DIRECT: Direct LDL: 138.8 mg/dL

## 2012-06-07 LAB — HEMOGLOBIN A1C: Hgb A1c MFr Bld: 7.5 % — ABNORMAL HIGH (ref 4.6–6.5)

## 2012-06-07 MED ORDER — HYDROCODONE-ACETAMINOPHEN 5-500 MG PO TABS: 1.0000 | ORAL_TABLET | Freq: Four times a day (QID) | ORAL | Status: DC | PRN

## 2012-06-07 MED ORDER — METOPROLOL SUCCINATE ER 50 MG PO TB24: 50.0000 mg | ORAL_TABLET | Freq: Every day | ORAL | Status: DC

## 2012-06-07 MED ORDER — EPINEPHRINE 0.3 MG/0.3ML IJ DEVI: 0.3000 mg | Freq: Once | INTRAMUSCULAR | Status: DC

## 2012-06-07 MED ORDER — SYRINGE (DISPOSABLE) 3 ML MISC: Status: AC

## 2012-06-07 MED ORDER — SAXAGLIPTIN-METFORMIN ER 2.5-1000 MG PO TB24: ORAL_TABLET | ORAL | Status: DC

## 2012-06-07 MED ORDER — CYANOCOBALAMIN 1000 MCG/ML IJ SOLN: 1000.0000 ug | Freq: Once | INTRAMUSCULAR | Status: DC

## 2012-06-07 MED ORDER — GLUCOSE BLOOD VI STRP: ORAL_STRIP | Status: DC

## 2012-06-07 NOTE — Progress Notes (Signed)
Patient ID: Maria Bentley, female   DOB: 28-Oct-1971, 41 y.o.   MRN: [id number]  Patient Active Problem List  Diagnosis  . Migraine  . Sleep apnea  . Otitis media of left ear  . Diabetes mellitus type 2 in obese  . Anxiety  . Headache, common migraine, intractable  . Hyperlipidemia  . Obesity (BMI 30-39.9)    Subjective:  CC:   Chief Complaint  Patient presents with  . Follow-up    3 month    HPI:   Maria Boatman Timmonsis a 41 y.o. female who presents 3 month follow up on diabetes mellitus, hyperlipidemia and obesity.  At last visit her hgba1c was elevated beyond gaol of 7.0 and we added glipizide. She had several symptomatic hypoglycemic events and is adjusting her dose based on carb assessment of meal.   She continues to have periodic left lateral elbow pain and has been told she chipper her epicondyle on left arm per x ray from chiropractor Cherre Huger.  May .  Using a neoprene brace and avoiding physical exertion including lifting with that arm  which is helping reduce aggravatio nof apin .  Some swelling relieved with ice packs. No ROM restriction .    Past Medical History  Diagnosis Date  . Diabetes mellitus   . Morbid obesity     s/p gastric bypass  . Anxiety   . Headache, common migraine, intractable   . Hyperlipidemia   . Thyroid disease   . Depression 2010  . Anemia   . Rosacea   . Kidney stones     left  . Personal hx of gastric bypass 2010    Past Surgical History  Procedure Date  . Gastric bypass   . Dilation and curettage of uterus 2011   The following portions of the patient's history were reviewed and updated as appropriate: Allergies, current medications, and problem list.   Review of Systems:  Comprehensive review of systems was negative except those addressed in the HPI,   History   Social History  . Marital Status: Married    Spouse Name: N/A    Number of Children: N/A  . Years of Education: N/A   Occupational History  . Not  on file.   Social History Main Topics  . Smoking status: Former Smoker    Quit date: 02/05/1996  . Smokeless tobacco: Never Used  . Alcohol Use: No  . Drug Use: No  . Sexually Active: Not on file   Other Topics Concern  . Not on file   Social History Narrative  . No narrative on file    Objective:  BP 118/74  Pulse 88  Temp 98 F (36.7 C) (Oral)  Resp 16  Wt 209 lb (94.802 kg)  SpO2 98%  General appearance: alert, cooperative and appears stated age Neck: no adenopathy, no carotid bruit, supple, symmetrical, trachea midline and thyroid not enlarged, symmetric, no tenderness/mass/nodules Back: symmetric, no curvature. ROM normal. No CVA tenderness. Lungs: clear to auscultation bilaterally Heart: regular rate and rhythm, S1, S2 normal, no murmur, click, rub or gallop Abdomen: soft, non-tender; bowel sounds normal; no masses,  no organomegaly Pulses: 2+ and symmetric Skin: Skin color, texture, turgor normal. No rashes or lesions Lymph nodes: Cervical, supraclavicular, and axillary nodes normal. MSK left elbow with full ROM.  Pt tenderness on  lateral epicondyle Assessment and Plan:  Diabetes mellitus type 2 in obese With reduction in a1c from 8.7 to 7.5 ,. She has noted high postprandials.Marland Kitchen  Will stop glipizide and try saxagliptan.  Samples of kombiglyze given to patient   Headache, common migraine, intractable Managed wit metoprolol ER and verapamil  Hyperlipidemia LDL 138 ,  not at goal.  Trial of red yeast rice 600 mg twice daily recommended.   Obesity (BMI 30-39.9) Despite prior gastric bypass.  She gained 15 lbs ths spring and has lost 4 since last visit two months ago.  I have addressed  BMI and that patient start exercising with a goal of 30 minutes of aerobic exercise a minimum of 5 days per week.    Updated Medication List Outpatient Encounter Prescriptions as of 06/07/2012  Medication Sig Dispense Refill  . acetaminophen (TYLENOL) 325 MG tablet Take 650 mg  by mouth every 6 (six) hours as needed.      Marland Kitchen buPROPion (WELLBUTRIN XL) 300 MG 24 hr tablet Take 300 mg by mouth daily.      . Cholecalciferol (VITAMIN D3) 2000 UNITS TABS Take by mouth.      . fluticasone (FLONASE) 50 MCG/ACT nasal spray Place 2 sprays into the nose daily.  16 g  6  . folic acid (FOLVITE) 1 MG tablet Take 1 mg by mouth daily.      . furosemide (LASIX) 20 MG tablet Take 1 tablet (20 mg total) by mouth daily as needed.  90 tablet  3  . gabapentin (NEURONTIN) 300 MG capsule Take 1 capsule (300 mg total) by mouth 3 (three) times daily.  90 capsule  3  . glucose blood (ONE TOUCH ULTRA TEST) test strip Use as instructed  100 each  6  . lamoTRIgine (LAMICTAL) 150 MG tablet Take 150 mg by mouth daily.      . meclizine (ANTIVERT) 25 MG tablet Take 1 tablet (25 mg total) by mouth 3 (three) times daily as needed.  90 tablet  6  . methocarbamol (ROBAXIN) 500 MG tablet Take 1 tablet (500 mg total) by mouth 4 (four) times daily.  90 tablet  5  . metoprolol succinate (TOPROL-XL) 50 MG 24 hr tablet Take 1 tablet (50 mg total) by mouth daily. Take with or immediately following a meal.  30 tablet  11  . metroNIDAZOLE (METROCREAM) 0.75 % cream Apply topically 2 (two) times daily.  45 g  3  . PHENADOZ 25 MG suppository INSERT ONE SUPPOSITORY RECTALLY EVERY SIX HOURS AS NEEDED FOR NAUSEA  12 suppository  2  . potassium chloride (K-DUR) 10 MEQ tablet Take 2 tablets (20 mEq total) by mouth daily.  90 tablet  3  . Prenatal Vit-Fe Fumarate-FA (PRENATAL MULTIVITAMIN) TABS Take 1 tablet by mouth daily.      . SUMAtriptan (IMITREX) 100 MG tablet Take 1 tablet (100 mg total) by mouth every 2 (two) hours as needed.  9 tablet  11  . traMADol (ULTRAM) 50 MG tablet Take 1 tablet (50 mg total) by mouth every 6 (six) hours as needed for pain.  90 tablet  3  . verapamil (CALAN-SR) 120 MG CR tablet TAKE ONE TABLET BY MOUTH NIGHTLY AT BEDTIME  30 tablet  1  . DISCONTD: glipiZIDE (GLUCOTROL) 5 MG tablet Take 1 tablet  (5 mg total) by mouth 2 (two) times daily before a meal.  180 tablet  2  . DISCONTD: metFORMIN (GLUCOPHAGE) 500 MG tablet Take 2 tablets (1,000 mg total) by mouth 2 (two) times daily with a meal.  120 tablet  2  . DISCONTD: metoprolol succinate (TOPROL-XL) 50 MG 24 hr tablet Take 1 tablet (  50 mg total) by mouth daily. Take with or immediately following a meal.  90 tablet  3  . DISCONTD: ONE TOUCH ULTRA TEST test strip TAKE/USE PER MANUFACTURER INSTRUCTIONS OR DIRECTIONS FROM PHYSICIAN  100 each  6  . cyanocobalamin (,VITAMIN B-12,) 1000 MCG/ML injection Inject 1 mL (1,000 mcg total) into the muscle once.  25 mL  0  . EPINEPHrine (EPIPEN) 0.3 mg/0.3 mL DEVI Inject 0.3 mLs (0.3 mg total) into the muscle once.  1 Device  5  . HYDROcodone-acetaminophen (VICODIN) 5-500 MG per tablet Take 1 tablet by mouth every 6 (six) hours as needed for pain.  30 tablet  1  . Saxagliptin-Metformin (KOMBIGLYZE XR) 2.04-999 MG TB24 1 tablet daily,  May increase to 2 after one to two weeks if needed  60 tablet  1  . Syringe, Disposable, 3 ML MISC 1 syringe per use for b12 administratio  25 each  0  . DISCONTD: promethazine (PHENERGAN) 25 MG suppository Place 1 suppository (25 mg total) rectally 3 (three) times daily as needed for nausea.  12 each  2  . DISCONTD: promethazine (PHENERGAN) 25 MG suppository Place 1 suppository (25 mg total) rectally every 6 (six) hours as needed for nausea.  12 each  3     Orders Placed This Encounter  Procedures  . TSH  . Lipid panel  . COMPLETE METABOLIC PANEL WITH GFR  . Magnesium  . Hemoglobin A1c  . Vitamin D 25 hydroxy  . Vitamin B12  . LDL cholesterol, direct  . POCT Glucose (Device for Home Use)    Return in about 3 months (around 09/07/2012).

## 2012-06-07 NOTE — Patient Instructions (Addendum)
Stop the glipizide and metformin.  Samples :  start with 1 kombiglyze daily  .  You may increase to 2 after 2 weeks if sugars remain elevated.  \\

## 2012-06-08 LAB — VITAMIN D 25 HYDROXY (VIT D DEFICIENCY, FRACTURES): Vit D, 25-Hydroxy: 37 ng/mL (ref 30–89)

## 2012-06-08 LAB — COMPLETE METABOLIC PANEL WITH GFR
AST: 13 U/L (ref 0–37)
Alkaline Phosphatase: 57 U/L (ref 39–117)
BUN: 13 mg/dL (ref 6–23)
Calcium: 9.2 mg/dL (ref 8.4–10.5)
Chloride: 103 mEq/L (ref 96–112)
Creat: 0.85 mg/dL (ref 0.50–1.10)
Total Bilirubin: 0.3 mg/dL (ref 0.3–1.2)

## 2012-06-09 ENCOUNTER — Encounter: Payer: Self-pay | Admitting: Internal Medicine

## 2012-06-09 NOTE — Assessment & Plan Note (Addendum)
Managed wit metoprolol ER and verapamil

## 2012-06-09 NOTE — Assessment & Plan Note (Signed)
LDL 138 ,  not at goal.  Trial of red yeast rice 600 mg twice daily recommended.

## 2012-06-09 NOTE — Assessment & Plan Note (Signed)
Despite prior gastric bypass.  She gained 15 lbs ths spring and has lost 4 since last visit two months ago.  I have addressed  BMI and that patient start exercising with a goal of 30 minutes of aerobic exercise a minimum of 5 days per week.

## 2012-06-09 NOTE — Assessment & Plan Note (Addendum)
With reduction in a1c from 8.7 to 7.5 ,. She has noted high postprandials..  Will stop glipizide and try saxagliptan.  Samples of kombiglyze given to patient

## 2012-06-10 ENCOUNTER — Other Ambulatory Visit: Payer: Self-pay | Admitting: Internal Medicine

## 2012-06-10 DIAGNOSIS — Z9884 Bariatric surgery status: Secondary | ICD-10-CM

## 2012-06-10 MED ORDER — CYANOCOBALAMIN 1000 MCG/ML IJ SOLN: 1000.0000 ug | INTRAMUSCULAR | Status: DC

## 2012-06-11 ENCOUNTER — Other Ambulatory Visit: Payer: Self-pay | Admitting: Internal Medicine

## 2012-06-11 DIAGNOSIS — Z9884 Bariatric surgery status: Secondary | ICD-10-CM

## 2012-06-11 MED ORDER — CYANOCOBALAMIN 1000 MCG/ML IJ SOLN: 1000.0000 ug | INTRAMUSCULAR | Status: AC

## 2012-07-24 ENCOUNTER — Other Ambulatory Visit: Payer: Self-pay | Admitting: Internal Medicine

## 2012-07-27 ENCOUNTER — Other Ambulatory Visit: Payer: Self-pay | Admitting: Internal Medicine

## 2012-07-29 ENCOUNTER — Other Ambulatory Visit: Payer: Self-pay | Admitting: Internal Medicine

## 2012-07-29 MED ORDER — LANCET DEVICE MISC: Status: DC

## 2012-07-29 MED ORDER — BLOOD GLUCOSE TEST VI STRP: ORAL_STRIP | Status: DC

## 2012-08-15 ENCOUNTER — Other Ambulatory Visit: Payer: Self-pay | Admitting: *Deleted

## 2012-08-16 ENCOUNTER — Other Ambulatory Visit: Payer: Self-pay | Admitting: Internal Medicine

## 2012-08-16 ENCOUNTER — Other Ambulatory Visit: Payer: Self-pay | Admitting: *Deleted

## 2012-08-16 DIAGNOSIS — E1165 Type 2 diabetes mellitus with hyperglycemia: Secondary | ICD-10-CM

## 2012-08-16 DIAGNOSIS — IMO0002 Reserved for concepts with insufficient information to code with codable children: Secondary | ICD-10-CM

## 2012-08-16 MED ORDER — SAXAGLIPTIN-METFORMIN ER 2.5-1000 MG PO TB24: ORAL_TABLET | ORAL | Status: DC

## 2012-08-21 ENCOUNTER — Other Ambulatory Visit: Payer: Self-pay | Admitting: Internal Medicine

## 2012-09-06 ENCOUNTER — Other Ambulatory Visit: Payer: Self-pay | Admitting: Internal Medicine

## 2012-09-06 ENCOUNTER — Ambulatory Visit: Payer: BC Managed Care – PPO | Admitting: Internal Medicine

## 2012-09-06 DIAGNOSIS — Z9884 Bariatric surgery status: Secondary | ICD-10-CM

## 2012-09-09 MED ORDER — HYDROCODONE-ACETAMINOPHEN 5-500 MG PO TABS: 1.0000 | ORAL_TABLET | Freq: Four times a day (QID) | ORAL | Status: AC | PRN

## 2012-09-10 NOTE — Telephone Encounter (Signed)
RX faxed

## 2012-09-16 ENCOUNTER — Encounter: Payer: Self-pay | Admitting: Internal Medicine

## 2012-09-16 ENCOUNTER — Ambulatory Visit (INDEPENDENT_AMBULATORY_CARE_PROVIDER_SITE_OTHER): Payer: BC Managed Care – PPO | Admitting: Internal Medicine

## 2012-09-16 VITALS — BP 130/82 | HR 96 | Temp 98.0°F | Ht 65.0 in | Wt 219.0 lb

## 2012-09-16 DIAGNOSIS — E119 Type 2 diabetes mellitus without complications: Secondary | ICD-10-CM

## 2012-09-16 DIAGNOSIS — E1169 Type 2 diabetes mellitus with other specified complication: Secondary | ICD-10-CM

## 2012-09-16 DIAGNOSIS — IMO0002 Reserved for concepts with insufficient information to code with codable children: Secondary | ICD-10-CM

## 2012-09-16 DIAGNOSIS — E669 Obesity, unspecified: Secondary | ICD-10-CM

## 2012-09-16 DIAGNOSIS — E1165 Type 2 diabetes mellitus with hyperglycemia: Secondary | ICD-10-CM

## 2012-09-16 DIAGNOSIS — IMO0001 Reserved for inherently not codable concepts without codable children: Secondary | ICD-10-CM

## 2012-09-16 MED ORDER — SAXAGLIPTIN-METFORMIN ER 2.5-1000 MG PO TB24: 2.0000 | ORAL_TABLET | Freq: Every day | ORAL | Status: DC

## 2012-09-16 MED ORDER — AZITHROMYCIN 500 MG PO TABS: 500.0000 mg | ORAL_TABLET | Freq: Every day | ORAL | Status: DC

## 2012-09-16 NOTE — Patient Instructions (Addendum)
You have a viral sinus infection .  Lavage your sinuses twice daly with Simply saline nasal spray.  Use benadryl 25 mg every 8 hours and Sudafed PE 10 to 30 every 8 hours or Afrin nasal spray 1 12 hrs  to manage the drainage and congestion.  Flonase twice daily   Gargle with salt water often for the sore throat.  If the throat is no better  In 3 to 4 days OR  if you develop T > 100.4,  Green nasal discharge,  Or facial pain,  Start the  antibiotic.

## 2012-09-16 NOTE — Progress Notes (Signed)
Patient ID: Maria Bentley, female   DOB: 1971-07-30, 41 y.o.   MRN: [id number] Patient Active Problem List  Diagnosis  . Migraine  . Sleep apnea  . Diabetes mellitus type 2 in obese  . Anxiety  . Headache, common migraine, intractable  . Hyperlipidemia  . Obesity (BMI 30-39.9)    Subjective:  CC:   Chief Complaint  Patient presents with  . Follow-up    flu shot    HPI:   Maria Moland Timmonsis a 41 y.o. female who presents Diabetes followup.  Her blood sugars have been running high on once daily kombiglyze.    She has been more depressed lately due to husband's worsening depression and her inability to conceive and not following a low GI diet,  She is disappointed in her inability to conceive but is planning to initiate adoption attempts once her mother's health has stabilized.  2) Having recurrent joint pain in the joints of the right hand fore the last several week.  Symptoms are aggravated by leftr shoulder issues and favoring of left side.  Told to avoid nsaids bc of gastric bypasss.  Wants to defer labs at this time and return in 3 months    Past Medical History  Diagnosis Date  . Diabetes mellitus   . Morbid obesity     s/p gastric bypass  . Anxiety   . Headache, common migraine, intractable   . Hyperlipidemia   . Thyroid disease   . Depression 2010  . Anemia   . Rosacea   . Kidney stones     left  . Personal hx of gastric bypass 2010    Past Surgical History  Procedure Date  . Gastric bypass   . Dilation and curettage of uterus 2011         The following portions of the patient's history were reviewed and updated as appropriate: Allergies, current medications, and problem list.    Review of Systems:  A comprehensive ROS was done and positive for weight gain , joint pain, and decreased energy.   The rest was negative.     History   Social History  . Marital Status: Married    Spouse Name: N/A    Number of Children: N/A  . Years of  Education: N/A   Occupational History  . Not on file.   Social History Main Topics  . Smoking status: Former Smoker    Quit date: 02/05/1996  . Smokeless tobacco: Never Used  . Alcohol Use: No  . Drug Use: No  . Sexually Active: Not on file   Other Topics Concern  . Not on file   Social History Narrative  . No narrative on file    Objective:  BP 130/82  Pulse 96  Temp 98 F (36.7 C) (Oral)  Ht 5\' 5"  (1.651 m)  Wt 219 lb (99.338 kg)  BMI 36.44 kg/m2  SpO2 98%  General appearance: alert, cooperative and appears stated age Ears: normal TM's and external ear canals both ears Throat: lips, mucosa, and tongue normal; teeth and gums normal Neck: no adenopathy, no carotid bruit, supple, symmetrical, trachea midline and thyroid not enlarged, symmetric, no tenderness/mass/nodules Back: symmetric, no curvature. ROM normal. No CVA tenderness. Lungs: clear to auscultation bilaterally Heart: regular rate and rhythm, S1, S2 normal, no murmur, click, rub or gallop Abdomen: soft, non-tender; bowel sounds normal; no masses,  no organomegaly Pulses: 2+ and symmetric Skin: Skin color, texture, turgor normal. No rashes or lesions Lymph nodes: Cervical,  supraclavicular, and axillary nodes normal.  Assessment and Plan:  Diabetes mellitus type 2 in obese We're increasing her Kombihglyze to 2 tablets daily for blood sugars that have been uncontrolled lately. She has deferred routine blood testing today since she knows it will be high and only serve to aggravate her mood.  We will have her a come back in 3 months.  Obesity (BMI 30-39.9) Status post gastric bypass surgery some years ago with recent relapse is due to mood disorder. Encouragement given. We spent 15-20 minutes today discussing her her diet and exercise plans.   Updated Medication List Outpatient Encounter Prescriptions as of 09/16/2012  Medication Sig Dispense Refill  . acetaminophen (TYLENOL) 325 MG tablet Take 650 mg by  mouth every 6 (six) hours as needed.      Marland Kitchen buPROPion (WELLBUTRIN XL) 300 MG 24 hr tablet Take 300 mg by mouth daily.      . Cholecalciferol (VITAMIN D3) 2000 UNITS TABS Take by mouth.      . cyanocobalamin (,VITAMIN B-12,) 1000 MCG/ML injection Inject 1 mL (1,000 mcg total) into the muscle once a week.  25 mL  0  . EPINEPHrine (EPIPEN) 0.3 mg/0.3 mL DEVI Inject 0.3 mLs (0.3 mg total) into the muscle once.  1 Device  5  . fluticasone (FLONASE) 50 MCG/ACT nasal spray Place 2 sprays into the nose daily.  16 g  6  . folic acid (FOLVITE) 1 MG tablet Take 1 mg by mouth daily.      . furosemide (LASIX) 20 MG tablet Take 1 tablet (20 mg total) by mouth daily as needed.  90 tablet  3  . gabapentin (NEURONTIN) 300 MG capsule Take 1 capsule (300 mg total) by mouth 3 (three) times daily.  90 capsule  3  . Glucose Blood (BLOOD GLUCOSE TEST STRIPS) STRP Test blood sugar two times daily  100 each  6  . glucose blood (ONE TOUCH ULTRA TEST) test strip Use as instructed  100 each  6  . HYDROcodone-acetaminophen (VICODIN) 5-500 MG per tablet Take 1 tablet by mouth every 6 (six) hours as needed for pain.  30 tablet  1  . lamoTRIgine (LAMICTAL) 150 MG tablet Take 150 mg by mouth daily.      Demetra Shiner Device MISC Test blood sugar two times daily  100 each  6  . meclizine (ANTIVERT) 25 MG tablet Take 1 tablet (25 mg total) by mouth 3 (three) times daily as needed.  90 tablet  6  . methocarbamol (ROBAXIN) 500 MG tablet Take 1 tablet (500 mg total) by mouth 4 (four) times daily.  90 tablet  5  . metoprolol succinate (TOPROL-XL) 50 MG 24 hr tablet Take 1 tablet (50 mg total) by mouth daily. Take with or immediately following a meal.  30 tablet  11  . metroNIDAZOLE (METROCREAM) 0.75 % cream Apply topically 2 (two) times daily.  45 g  3  . PHENADOZ 25 MG suppository INSERT ONE SUPPOSITORY RECTALLY EVERY SIX HOURS AS NEEDED FOR NAUSEA  12 suppository  1  . potassium chloride (K-DUR) 10 MEQ tablet Take 2 tablets (20 mEq total)  by mouth daily.  90 tablet  3  . Prenatal Vit-Fe Fumarate-FA (PRENATAL MULTIVITAMIN) TABS Take 1 tablet by mouth daily.      . Saxagliptin-Metformin (KOMBIGLYZE XR) 2.04-999 MG TB24 Take 2 tablets by mouth daily. 1 tablet daily,  May increase to 2 after one to two weeks if needed  60 tablet  5  .  SUMAtriptan (IMITREX) 100 MG tablet Take 1 tablet (100 mg total) by mouth every 2 (two) hours as needed.  9 tablet  11  . Syringe, Disposable, 3 ML MISC 1 syringe per use for b12 administratio  25 each  0  . traMADol (ULTRAM) 50 MG tablet TAKE ONE TABLET BY MOUTH EVERY 6 HOURS AS NEEDED FOR RELIEF FROM PAIN  90 tablet  2  . verapamil (CALAN-SR) 120 MG CR tablet TAKE ONE TABLET BY MOUTH NIGHTLY AT BEDTIME  30 tablet  6  . DISCONTD: Saxagliptin-Metformin (KOMBIGLYZE XR) 2.04-999 MG TB24 1 tablet daily,  May increase to 2 after one to two weeks if needed  60 tablet  1  . azithromycin (ZITHROMAX) 500 MG tablet Take 1 tablet (500 mg total) by mouth daily.  7 tablet  0     No orders of the defined types were placed in this encounter.    Return in about 3 months (around 12/17/2012).

## 2012-09-17 NOTE — Assessment & Plan Note (Signed)
Status post gastric bypass surgery some years ago with recent relapse is due to mood disorder. Encouragement given. We spent 15-20 minutes today discussing her her diet and exercise plans.

## 2012-09-17 NOTE — Assessment & Plan Note (Signed)
We're increasing her Kombihglyze to 2 tablets daily for blood sugars that have been uncontrolled lately. She has deferred routine blood testing today since she knows it will be high and only serve to aggravate her mood.  We will have her a come back in 3 months.

## 2012-09-28 ENCOUNTER — Other Ambulatory Visit: Payer: Self-pay | Admitting: Internal Medicine

## 2012-09-28 NOTE — Telephone Encounter (Signed)
[facility] to [facility]

## 2012-10-19 ENCOUNTER — Other Ambulatory Visit: Payer: Self-pay | Admitting: Internal Medicine

## 2012-10-25 ENCOUNTER — Other Ambulatory Visit: Payer: Self-pay | Admitting: Internal Medicine

## 2012-10-28 LAB — HM MAMMOGRAPHY: HM Mammogram: NORMAL

## 2012-10-31 ENCOUNTER — Ambulatory Visit: Payer: Self-pay | Admitting: Internal Medicine

## 2012-11-08 ENCOUNTER — Telehealth: Payer: Self-pay

## 2012-11-08 MED ORDER — HYDROCODONE-ACETAMINOPHEN 5-500 MG PO TABS: 1.0000 | ORAL_TABLET | Freq: Four times a day (QID) | ORAL | Status: DC | PRN

## 2012-11-08 NOTE — Telephone Encounter (Signed)
Refill request from [facility] pharmacy for Hydroco/APAP 5/500 mg. Ok to fill?

## 2012-11-08 NOTE — Telephone Encounter (Signed)
Ok to refill,  Authorized in epic 

## 2012-11-11 ENCOUNTER — Other Ambulatory Visit: Payer: Self-pay | Admitting: Internal Medicine

## 2012-11-11 ENCOUNTER — Other Ambulatory Visit: Payer: Self-pay

## 2012-11-11 ENCOUNTER — Encounter: Payer: Self-pay | Admitting: Internal Medicine

## 2012-11-11 MED ORDER — HYDROCODONE-ACETAMINOPHEN 5-325 MG PO TABS: 1.0000 | ORAL_TABLET | Freq: Four times a day (QID) | ORAL | Status: DC | PRN

## 2012-11-11 NOTE — Telephone Encounter (Signed)
Hydrocodone acetaminophen 5-325 faxed to [facility] [phone]

## 2012-12-23 ENCOUNTER — Ambulatory Visit: Payer: BC Managed Care – PPO | Admitting: Internal Medicine

## 2013-01-12 ENCOUNTER — Other Ambulatory Visit: Payer: Self-pay | Admitting: Internal Medicine

## 2013-01-13 NOTE — Telephone Encounter (Signed)
Ok to fill suppository?

## 2013-02-09 ENCOUNTER — Other Ambulatory Visit: Payer: Self-pay | Admitting: Internal Medicine

## 2013-02-24 ENCOUNTER — Other Ambulatory Visit: Payer: Self-pay | Admitting: Internal Medicine

## 2013-02-28 ENCOUNTER — Other Ambulatory Visit: Payer: Self-pay | Admitting: Internal Medicine

## 2013-03-02 ENCOUNTER — Other Ambulatory Visit: Payer: Self-pay | Admitting: Internal Medicine

## 2013-03-10 ENCOUNTER — Other Ambulatory Visit: Payer: Self-pay | Admitting: Internal Medicine

## 2013-03-10 ENCOUNTER — Other Ambulatory Visit: Payer: Self-pay | Admitting: *Deleted

## 2013-03-10 MED ORDER — HYDROCODONE-ACETAMINOPHEN 5-325 MG PO TABS: 1.0000 | ORAL_TABLET | Freq: Four times a day (QID) | ORAL | Status: DC | PRN

## 2013-03-10 NOTE — Telephone Encounter (Signed)
Ok to refill,  Authorized in epic. Please call in  

## 2013-03-11 NOTE — Telephone Encounter (Signed)
Tried to call in [facility] closed until 9am. Will try again this pm.

## 2013-03-17 ENCOUNTER — Other Ambulatory Visit: Payer: Self-pay | Admitting: Internal Medicine

## 2013-03-20 ENCOUNTER — Telehealth: Payer: Self-pay | Admitting: Internal Medicine

## 2013-03-20 ENCOUNTER — Ambulatory Visit (INDEPENDENT_AMBULATORY_CARE_PROVIDER_SITE_OTHER): Payer: BC Managed Care – PPO | Admitting: Adult Health

## 2013-03-20 ENCOUNTER — Encounter: Payer: Self-pay | Admitting: Adult Health

## 2013-03-20 VITALS — BP 120/74 | HR 85 | Temp 98.1°F | Resp 16 | Wt 207.5 lb

## 2013-03-20 DIAGNOSIS — J019 Acute sinusitis, unspecified: Secondary | ICD-10-CM | POA: Insufficient documentation

## 2013-03-20 DIAGNOSIS — J329 Chronic sinusitis, unspecified: Secondary | ICD-10-CM

## 2013-03-20 MED ORDER — AZITHROMYCIN 250 MG PO TABS: ORAL_TABLET | ORAL | Status: DC

## 2013-03-20 NOTE — Telephone Encounter (Signed)
Patient Information:  Caller Name: Fairy  Phone: [phone]  Patient: Maria Bentley, Maria Bentley  Gender: Female  DOB: [birth year]  Age: 42 Years  PCP: Duncan Dull (Adults only)  Pregnant: No  Office Follow Up:  Does the office need to follow up with this patient?: No  Instructions For The Office: N/A  RN Note:  Resolved cold about 10 days ago with a flare 03/18/13 ---sinus pain, ear congestion, swollen tonsils, blowing out dark yellow mucus; cough that is slightly congestive cough and slight wheezing; Intake/output normal for patient; afebrile.  Sore throat pain is a 4/10 with sinus pain 6/10 on pain scale.  It was worse yesterday with onset of symptoms.  Triaged with care advice given.  Appointment scheduled. with R.Rey at 11:30 today 03/20/13.  Symptoms  Reason For Call & Symptoms: Feels she has a sinus infection.  Cold about 10 days ago.  It had cleared but a flare started with swollen tonsils, and sinus hurt.  Reviewed Health History In EMR: Yes  Reviewed Medications In EMR: Yes  Reviewed Allergies In EMR: Yes  Reviewed Surgeries / Procedures: Yes  Date of Onset of Symptoms: 03/18/2013  Treatments Tried: OTC  Treatments Tried Worked: No OB / GYN:  LMP: 02/03/2013  Guideline(s) Used:  Colds  Disposition Per Guideline:   See Today or Tomorrow in Office  Reason For Disposition Reached:   Nasal discharge present > 10 days  Advice Given:  N/A  Patient Will Follow Care Advice:  YES  Appointment Scheduled:  03/20/2013 11:30:00 Appointment Scheduled Provider:  Orville Govern

## 2013-03-20 NOTE — Telephone Encounter (Signed)
FYI. Pt has an appt with Raquel today.

## 2013-03-20 NOTE — Patient Instructions (Addendum)
Start your Azithromycin today.  Continue Flonase.  What is sinusitis? - Sinusitis is a condition that can cause a stuffy nose, pain in the face, and yellow or green discharge (mucus) from the nose. The sinuses are hollow areas in the bones of the face. They have a thin lining that normally makes a small amount of mucus. When this lining gets infected, it swells and makes extra mucus. This causes symptoms.   Sinusitis can occur when a person gets sick with a cold. The germs causing the cold can also infect the sinuses. Many times, a person feels like his or her cold is getting better. But then he or she gets sinusitis and begins to feel sick again.  What are the symptoms of sinusitis? - Common symptoms of sinusitis include: Stuffy or blocked nose  Thick yellow or green discharge from the nose  Pain in the teeth  Pain or pressure in the face - This often feels worse when a person bends forward.   People with sinusitis can also have other symptoms that include: Fever  Cough  Trouble smelling  Ear pressure or fullness  Headache  Bad breath  Feeling tired   Most of the time, symptoms start to improve in 7 to 10 days.  Should I see a doctor or nurse? - See your doctor or nurse if your symptoms last more than 7 days, or if your symptoms get better at first but then get worse. Sometimes, sinusitis can lead to serious problems. See your doctor or nurse right away (do not wait 7 days) if you have: Fever higher than 102.84F (39.2C)  Sudden and severe pain in the face and head  Trouble seeing or seeing double  Trouble thinking clearly  Swelling or redness around 1 or both eyes  Trouble breathing or a stiff neck   Is there anything I can do on my own to feel better? - Yes. To reduce your symptoms, you can: Take an over-the-counter pain reliever to reduce the pain  Rinse your nose and sinuses with salt water a few times a day - Ask your doctor or nurse about the best way to do this.  Use  a decongestant nose spray - These sprays are sold in a pharmacy. But do not use decongestant nose sprays for more than 2 to 3 days in a row. Using them more than 3 days in a row can make symptoms worse.   You should NOT take an antihistamine for sinusitis. Common antihistamines include diphenhydramine (sample brand name: Benadryl), chlorpheniramine (sample brand name: Chlor-Trimeton), loratadine (sample brand name: Claritin), and cetirizine (sample brand name: Zyrtec). They can treat allergies, but not sinus infections, and could increase your discomfort by drying the lining of your nose and sinuses, or making you tired.   Your doctor might also prescribe a steroid nose spray to reduce the swelling in your nose. (Steroid nose sprays do not contain the same steroids that athletes take to build muscle.)  How is sinusitis treated? - Most of the time, sinusitis does not need to be treated with antibiotic medicines. This is because most sinusitis is caused by viruses - not bacteria - and antibiotics do not kill viruses. Many people get over sinus infections without antibiotics.  Some people with sinusitis do need treatment with antibiotics. If your symptoms have not improved after 7 to 10 days, ask your doctor if you should take antibiotics. Your doctor might recommend that you wait 1 more week to see if your symptoms  improve. But if you have symptoms such as a fever or a lot of pain, he or she might prescribe antibiotics. It is important to follow your doctor's instructions about taking your antibiotics.  What if my symptoms do not get better? - If your symptoms do not get better, talk with your doctor or nurse. He or she might order tests to figure out why you still have symptoms. These can include:  CT scan or other imaging tests - Imaging tests create pictures of the inside of the body.  A test to look inside the sinuses - For this test, a doctor puts a thin tube with a camera on the end into the nose  and up into the sinuses.   Some people get a lot of sinus infections or have symptoms that last at least 3 months. These people can have a different type of sinusitis called "chronic sinusitis." Chronic sinusitis can be caused by different things. For example, some people have growths in their nose or sinuses that are called "polyps." Other people have allergies that cause their symptoms.  Chronic sinusitis can be treated in different ways. If you have chronic sinusitis, talk with your doctor about which treatments are right for you.

## 2013-03-20 NOTE — Assessment & Plan Note (Signed)
Start azithromycin. Continue Flonase. Return to clinic if symptoms do not improve within 3-4 days.

## 2013-03-20 NOTE — Progress Notes (Signed)
Subjective:    Patient ID: Maria Bentley, female    DOB: 06-Mar-1971, 42 y.o.   MRN: [id number]  HPI  Patient presents with cold symptoms x 10 days. Recently, on Tuesday, she began to feels sinus pressure, yellow drainage, sinus HA, rhinorrhea, mild wheezing, pressure in ears. Denies fever of chills. 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 She has been trying Sudafed and Mucinex which helps temporarily. She denies shortness of breath, fever, chills or chest pain.   Current Outpatient Prescriptions on File Prior to Visit  Medication Sig Dispense Refill  . acetaminophen (TYLENOL) 325 MG tablet Take 650 mg by mouth every 6 (six) hours as needed.      Marland Kitchen buPROPion (WELLBUTRIN XL) 300 MG 24 hr tablet Take 300 mg by mouth daily.      . Cholecalciferol (VITAMIN D3) 2000 UNITS TABS Take by mouth.      . cyanocobalamin (,VITAMIN B-12,) 1000 MCG/ML injection Inject 1 mL (1,000 mcg total) into the muscle once a week.  25 mL  0  . EPINEPHrine (EPIPEN) 0.3 mg/0.3 mL DEVI Inject 0.3 mLs (0.3 mg total) into the muscle once.  1 Device  5  . fluticasone (FLONASE) 50 MCG/ACT nasal spray Place 2 sprays into the nose daily.  16 g  6  . furosemide (LASIX) 20 MG tablet Take 1 tablet (20 mg total) by mouth daily as needed.  90 tablet  3  . Glucose Blood (BLOOD GLUCOSE TEST STRIPS) STRP Test blood sugar two times daily  100 each  6  . HYDROcodone-acetaminophen (NORCO/VICODIN) 5-325 MG per tablet Take 1 tablet by mouth every 6 (six) hours as needed for pain.  30 tablet  3  . lamoTRIgine (LAMICTAL) 150 MG tablet Take 150 mg by mouth daily.      Demetra Shiner Device MISC Test blood sugar two times daily  100 each  6  . meclizine (ANTIVERT) 25 MG tablet Take 1 tablet (25 mg total) by mouth 3 (three) times daily as needed.  90 tablet  6  . methocarbamol (ROBAXIN) 500 MG tablet TAKE ONE TABLET BY MOUTH FOUR TIMES DAILY  90 tablet  4  . metoprolol succinate (TOPROL-XL) 50 MG 24  hr tablet Take 1 tablet (50 mg total) by mouth daily. Take with or immediately following a meal.  30 tablet  11  . PHENADOZ 25 MG suppository INSERT ONE SUPPOSITORY RECTALLY THREE TIMES DAILY AS NEEDED  12 suppository  2  . Prenatal Vit-Fe Fumarate-FA (PRENATAL MULTIVITAMIN) TABS Take 1 tablet by mouth daily.      . Saxagliptin-Metformin (KOMBIGLYZE XR) 2.04-999 MG TB24 Take 2 tablets by mouth daily. 1 tablet daily,  May increase to 2 after one to two weeks if needed  60 tablet  5  . SUMAtriptan (IMITREX) 100 MG tablet TAKE ONE TABLET BY MOUTH EVERY TWO HOURS AS NEEDED  9 tablet  10  . Syringe, Disposable, 3 ML MISC 1 syringe per use for b12 administratio  25 each  0  . traMADol (ULTRAM) 50 MG tablet TAKE ONE TABLET BY MOUTH EVERY 6 HOURS AS NEEDED FOR RELIEF FROM PAIN  90 tablet  1  . VENTOLIN HFA 108 (90 BASE) MCG/ACT inhaler INHALE ONE PUFF BY MOUTH EVERY SIX HOURS AS NEEDED FOR WHEEZING  18 each  4  . folic acid (FOLVITE) 1 MG tablet Take 1 mg by mouth daily.      . potassium chloride (MICRO-K)  10 MEQ CR capsule TAKE TWO CAPSULES BY MOUTH DAILY  90 capsule  2  . verapamil (CALAN-SR) 120 MG CR tablet TAKE ONE TABLET BY MOUTH   NIGHTLY AT BEDTIME  30 tablet  0   No current facility-administered medications on file prior to visit.     Review of Systems  Constitutional: Negative for fever and chills.  HENT: Positive for congestion, rhinorrhea, postnasal drip and sinus pressure.   Respiratory: Positive for cough, shortness of breath and wheezing.   Neurological: Positive for headaches.    BP 120/74  Pulse 85  Temp(Src) 98.1 F (36.7 C) (Oral)  Resp 16  Wt 207 lb 8 oz (94.121 kg)  BMI 34.53 kg/m2  SpO2 96%    Objective:   Physical Exam  Constitutional: She is oriented to person, place, and time. No distress.  Over weight  HENT:  Head: Normocephalic and atraumatic.  Right Ear: External ear normal.  Left Ear: External ear normal.  Pharyngeal erythema without exudate   Cardiovascular: Normal rate, regular rhythm and normal heart sounds.   No murmur heard. Pulmonary/Chest: Effort normal and breath sounds normal. No respiratory distress. She has no wheezes. She has no rales.  Neurological: She is alert and oriented to person, place, and time.  Skin: Skin is warm and dry.  Psychiatric: She has a normal mood and affect. Her behavior is normal. Judgment and thought content normal.       Assessment & Plan:

## 2013-04-15 ENCOUNTER — Other Ambulatory Visit: Payer: Self-pay | Admitting: Internal Medicine

## 2013-04-28 ENCOUNTER — Other Ambulatory Visit: Payer: Self-pay | Admitting: Internal Medicine

## 2013-04-29 ENCOUNTER — Telehealth: Payer: Self-pay | Admitting: *Deleted

## 2013-04-29 NOTE — Telephone Encounter (Signed)
[facility] has excellent cardiologists here in [place]. Dr. Kirke Corin,  Shirlee Latch, or gollan all are excellent.

## 2013-04-29 NOTE — Telephone Encounter (Signed)
Patient states she is seeing a headache specialist Dr. Ashley Royalty whom would like to refer patient to cardiology to rule out rapid pulse. Patient would like your recommendation.

## 2013-04-30 NOTE — Telephone Encounter (Signed)
Left message for patient to return call.

## 2013-05-08 ENCOUNTER — Telehealth: Payer: Self-pay | Admitting: Internal Medicine

## 2013-05-08 NOTE — Telephone Encounter (Signed)
Left message for patient to return my call.

## 2013-05-08 NOTE — Telephone Encounter (Signed)
Received OV notes from Headache clinic who was rec'd cardiology evaluation for persistent tachycardia.  I reviewed her pulses on visits here and they have all been below 100 with one exception.  And her blood pressure has been normal  Most of the time as well.  Please let Dr. Ashley Royalty know that at her next meeting with him

## 2013-05-08 NOTE — Telephone Encounter (Signed)
Patient notified

## 2013-05-12 NOTE — Telephone Encounter (Signed)
Patient notified

## 2013-05-15 ENCOUNTER — Telehealth: Payer: Self-pay

## 2013-05-15 NOTE — Telephone Encounter (Signed)
Received correspondence from Headache clinic stating pt need consult for tachycardia, left MOM for pt to return my call to schedule

## 2013-06-12 ENCOUNTER — Other Ambulatory Visit: Payer: Self-pay | Admitting: Internal Medicine

## 2013-06-12 NOTE — Telephone Encounter (Signed)
Refill denied, Glipizide discontinued at 6/13 office visit, changed to kombiglyze.

## 2013-06-25 ENCOUNTER — Ambulatory Visit (INDEPENDENT_AMBULATORY_CARE_PROVIDER_SITE_OTHER): Payer: BC Managed Care – PPO | Admitting: Internal Medicine

## 2013-06-25 ENCOUNTER — Encounter: Payer: Self-pay | Admitting: Internal Medicine

## 2013-06-25 VITALS — BP 122/86 | HR 70 | Temp 97.7°F | Resp 12 | Wt 210.2 lb

## 2013-06-25 DIAGNOSIS — E1169 Type 2 diabetes mellitus with other specified complication: Secondary | ICD-10-CM

## 2013-06-25 DIAGNOSIS — E119 Type 2 diabetes mellitus without complications: Secondary | ICD-10-CM

## 2013-06-25 DIAGNOSIS — E785 Hyperlipidemia, unspecified: Secondary | ICD-10-CM

## 2013-06-25 DIAGNOSIS — E669 Obesity, unspecified: Secondary | ICD-10-CM

## 2013-06-25 DIAGNOSIS — G43019 Migraine without aura, intractable, without status migrainosus: Secondary | ICD-10-CM

## 2013-06-25 DIAGNOSIS — IMO0001 Reserved for inherently not codable concepts without codable children: Secondary | ICD-10-CM

## 2013-06-25 DIAGNOSIS — G4730 Sleep apnea, unspecified: Secondary | ICD-10-CM

## 2013-06-25 DIAGNOSIS — B372 Candidiasis of skin and nail: Secondary | ICD-10-CM

## 2013-06-25 LAB — COMPREHENSIVE METABOLIC PANEL
AST: 8 U/L (ref 0–37)
Albumin: 3.3 g/dL — ABNORMAL LOW (ref 3.5–5.2)
Alkaline Phosphatase: 62 U/L (ref 39–117)
BUN: 20 mg/dL (ref 6–23)
Creatinine, Ser: 1 mg/dL (ref 0.4–1.2)
Glucose, Bld: 217 mg/dL — ABNORMAL HIGH (ref 70–99)
Potassium: 4 mEq/L (ref 3.5–5.1)
Total Bilirubin: 0.4 mg/dL (ref 0.3–1.2)

## 2013-06-25 LAB — MICROALBUMIN / CREATININE URINE RATIO
Creatinine,U: 126.2 mg/dL
Microalb, Ur: 1.6 mg/dL (ref 0.0–1.9)

## 2013-06-25 LAB — LIPID PANEL
HDL: 38.1 mg/dL — ABNORMAL LOW (ref >39.00)
Total CHOL/HDL Ratio: 5
VLDL: 24.2 mg/dL (ref 0.0–40.0)

## 2013-06-25 LAB — LDL CHOLESTEROL, DIRECT: Direct LDL: 156.5 mg/dL

## 2013-06-25 LAB — HEMOGLOBIN A1C: Hgb A1c MFr Bld: 10.3 % — ABNORMAL HIGH (ref 4.6–6.5)

## 2013-06-25 LAB — HM DIABETES FOOT EXAM: HM Diabetic Foot Exam: NORMAL

## 2013-06-25 MED ORDER — INSULIN NPH (HUMAN) (ISOPHANE) 100 UNIT/ML ~~LOC~~ SUSP: 20.0000 [IU] | Freq: Every day | SUBCUTANEOUS | Status: DC

## 2013-06-25 MED ORDER — "INSULIN SYRINGE-NEEDLE U-100 31G X 5/16"" 1 ML MISC": 1.0000 | Freq: Every day | Status: DC

## 2013-06-25 MED ORDER — GLUCOSE BLOOD VI STRP: ORAL_STRIP | Status: DC

## 2013-06-25 MED ORDER — NYSTATIN 100000 UNIT/GM EX POWD: Freq: Two times a day (BID) | CUTANEOUS | Status: DC

## 2013-06-25 MED ORDER — DIFLUCAN 100 MG PO TABS: 100.0000 mg | ORAL_TABLET | Freq: Every day | ORAL | Status: DC

## 2013-06-25 NOTE — Patient Instructions (Addendum)
We will start NPH insulin at bedtime or by 9:00 Pm  At 20 units  To begin with  Increase the dose every 3 days until your fasting sugars is consistently between 80 and 125.   continue 5 mg twice daily of glipizide and one tablet of kombiglyze daily for now, but decrease the glipizide once your fastings become controlled  Walking 15 minutes after your biggest meal will help lower your post prandials  Check your sugars twice daily :  1) fasting 2) 2 hr post prandial  (pick a different meal daily)

## 2013-06-25 NOTE — Progress Notes (Signed)
Patient ID: Maria Bentley, female   DOB: July 30, 1971, 42 y.o.   MRN: [id number]   Patient Active Problem List   Diagnosis Date Noted  . Candidiasis, intertriginous 06/27/2013  . Obesity (BMI 30-39.9) 06/09/2012  . Diabetes mellitus type 2 in obese   . Anxiety   . Headache, common migraine, intractable   . Hyperlipidemia   . Migraine 02/05/2012  . Sleep apnea 02/05/2012    Subjective:  CC:   Chief Complaint  Patient presents with  . Follow-up    DM follow up    HPI:   Maria Dwyer Timmonsis a 42 y.o. female who presents Followup on multiple chronic conditions including diabetes mellitus type 2, morbid obesity, status post gastric bypass, chronic daily headaches, and new onset numbness and tingling right lateral knee and right foot. Patient was last seen October 2014. She reports that she has been having Elevated blood sugars for several months,  Attributes it to dietary noncompliance aggravated by work stressors and home stressors.,  Husband  was recently fired,   her parents have recently separated and mother is clinically depressed overweight and has been living with her due to health problems. She states that her fasting blood sugars have been ranging from 250-350 for several weeks and are currently ranging from 180-250 for the last week after she made significant changes in her diet by restricting sweets. She has lost 10 pounds in the last 4-5 weeks. She is taking  glipizide 5 bid and one one kombiglyce daily . She is taking her medications as directed but feels she may need to start insulin to get her sugars under control. She has done this before.  2) abnormal sensation. She reports  tingling between great toe and 2nd toe on the right foot. 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   3) Chronicdaily headaches .   she has been seeing Dr. Leandro Reasoner   in [place] at the  headache clinic. Headache specialist added toradol for a week  and  referred her to Dr. Forrestine Him (neurologist/pain specialist )  who treated her with a lidocaine catheterized peocedure to numb ganglion in her sinuses.   this procedure has stopped the trigeminal pain and chronic daily headaches,    4) candidiasis. She has developed a pruritic rash under both breasts and under her pannus is requesting medication.       Past Medical History  Diagnosis Date  . Diabetes mellitus   . Morbid obesity     s/p gastric bypass  . Anxiety   . Headache, common migraine, intractable   . Hyperlipidemia   . Thyroid disease   . Depression 2010  . Anemia   . Rosacea   . Kidney stones     left  . Personal hx of gastric bypass 2010    Past Surgical History  Procedure Laterality Date  . Gastric bypass    . Dilation and curettage of uterus  2011       The following portions of the patient's history were reviewed and updated as appropriate: Allergies, current medications, and problem list.    Review of Systems:   Patient denies headache, fevers, malaise, unintentional weight loss, skin rash, eye pain, sinus congestion and sinus pain, sore throat, dysphagia,  hemoptysis , cough, dyspnea, wheezing, chest pain, palpitations, orthopnea, edema, abdominal pain, nausea, melena, diarrhea, constipation, flank pain, dysuria, hematuria, urinary  Frequency, nocturia, numbness, tingling, seizures,  Focal weakness, Loss of consciousness,  Tremor, insomnia, depression, anxiety, and suicidal ideation.        History   Social History  . Marital Status: Married    Spouse Name: N/A    Number of Children: N/A  . Years of Education: N/A   Occupational History  . Not on file.   Social History Main Topics  . Smoking status: Former Smoker    Quit date: 02/05/1996  . Smokeless tobacco: Never Used  . Alcohol Use: No  . Drug Use: No  . Sexually Active: Not on file   Other Topics Concern  . Not on file    Social History Narrative  . No narrative on file    Objective:  BP 122/86  Pulse 70  Temp(Src) 97.7 F (36.5 C) (Oral)  Resp 12  Wt 210 lb 4 oz (95.369 kg)  BMI 34.99 kg/m2  SpO2 97%  LMP 06/21/2013  General appearance: alert, cooperative and appears stated age Ears: normal TM's and external ear canals both ears Throat: lips, mucosa, and tongue normal; teeth and gums normal Neck: no adenopathy, no carotid bruit, supple, symmetrical, trachea midline and thyroid not enlarged, symmetric, no tenderness/mass/nodules Back: symmetric, no curvature. ROM normal. No CVA tenderness. Lungs: clear to auscultation bilaterally Heart: regular rate and rhythm, S1, S2 normal, no murmur, click, rub or gallop Abdomen: soft, non-tender; bowel sounds normal; no masses,  no organomegaly Pulses: 2+ and symmetric Skin: Skin color, texture, turgor normal. No rashes or lesions Lymph nodes: Cervical, supraclavicular, and axillary nodes normal. Foot exam:  Nails are well trimmed,  No callouses,  Sensation intact to microfilament   Assessment and Plan:  Diabetes mellitus type 2 in obese Uncontrolled by current A1c of 10.3. We are starting NPH insulin 20 units at bedtime. She will increase every 3 days until her fasting blood sugars are below 120. Continue oral medications. Exam was normal today except for evidence of lateral peroneal nerve compression.  Hyperlipidemia Elevated as well due to uncontrolled DM and diet. . We will repeat her lipids in 3 months and start statin therapy at that time if her LDL remains above 100.  Candidiasis, intertriginous Secondary to uncontrolled diabetes. Secondary to uncontrolled diabetes. Fluconazole x2 days. Nystatin powder to use 2-3 times daily as needed during summer.  Obesity (BMI 30-39.9) I have addressed  BMI and recommended a low glycemic index diet utilizing smaller more frequent meals to increase metabolism.  I have also recommended that patient start  exercising with a goal of 30 minutes of aerobic exercise a minimum of 5 days per week.   Headache, common migraine, intractable Improved with recent lidocaine catheterization of sinuses. Her headaches are being managed by a specialist in [place] [facility].  Sleep apnea Managed with nightly use of CPAP.  A total of 40 minutes was spent with patient more than half of which was spent in counseling, reviewing records from other providers and coordination of care.  Updated Medication List Outpatient Encounter Prescriptions as of 06/25/2013  Medication Sig Dispense Refill  . acetaminophen (TYLENOL) 325 MG tablet Take 650 mg by mouth every 6 (six) hours as needed.      Marland Kitchen buPROPion (WELLBUTRIN XL) 300 MG 24 hr tablet Take 300 mg by mouth daily.      . Cholecalciferol (VITAMIN D3) 2000 UNITS TABS Take by mouth.      . EPINEPHrine (EPIPEN) 0.3 mg/0.3 mL DEVI Inject 0.3 mLs (0.3 mg total) into the muscle  once.  1 Device  5  . fluticasone (FLONASE) 50 MCG/ACT nasal spray USE TWO SPRAYS IN EACH NOSTRIL DAILY  16 g  5  . furosemide (LASIX) 20 MG tablet Take 1 tablet (20 mg total) by mouth daily as needed.  90 tablet  3  . gabapentin (NEURONTIN) 300 MG capsule Take 1,800 mg by mouth Nightly.      Marland Kitchen glipiZIDE (GLUCOTROL) 5 MG tablet Take 5 mg by mouth 2 (two) times daily before a meal.      . HYDROcodone-acetaminophen (NORCO/VICODIN) 5-325 MG per tablet Take 1 tablet by mouth every 6 (six) hours as needed for pain.  30 tablet  3  . ketorolac (TORADOL) 10 MG tablet Take 10 mg by mouth every 6 (six) hours as needed for pain.      Marland Kitchen lamoTRIgine (LAMICTAL) 150 MG tablet Take 150 mg by mouth daily.      Demetra Shiner Device MISC Test blood sugar two times daily  100 each  6  . meclizine (ANTIVERT) 25 MG tablet Take 1 tablet (25 mg total) by mouth 3 (three) times daily as needed.  90 tablet  6  . methocarbamol (ROBAXIN) 500 MG tablet TAKE ONE TABLET BY MOUTH FOUR TIMES DAILY  90 tablet  4  . PHENADOZ 25 MG suppository  INSERT ONE SUPPOSITORY RECTALLY THREE TIMES DAILY AS NEEDED  12 suppository  2  . potassium chloride (MICRO-K) 10 MEQ CR capsule TAKE TWO CAPSULES BY MOUTH DAILY  90 capsule  2  . Prenatal Vit-Fe Fumarate-FA (PRENATAL MULTIVITAMIN) TABS Take 1 tablet by mouth daily.      . SUMAtriptan (IMITREX) 100 MG tablet TAKE ONE TABLET BY MOUTH EVERY TWO HOURS AS NEEDED  9 tablet  10  . Topiramate (TROKENDI XR PO) Pt will be on 25mg , then 50mg , then 75mg , finally 100mg  in the duration of a  Month.      . traMADol (ULTRAM) 50 MG tablet TAKE ONE TABLET BY MOUTH EVERY 6 HOURS AS NEEDED FOR RELIEF FROM PAIN  90 tablet  1  . VENTOLIN HFA 108 (90 BASE) MCG/ACT inhaler INHALE ONE PUFF BY MOUTH EVERY SIX HOURS AS NEEDED FOR WHEEZING  18 each  4  . [DISCONTINUED] Glucose Blood (BLOOD GLUCOSE TEST STRIPS) STRP Test blood sugar two times daily  100 each  6  . [DISCONTINUED] KOMBIGLYZE XR 2.04-999 MG TB24 TAKE ONE TABLET BY MOUTH ONCE DAILY MAY INCREASE TO TWO TABLETS DAILY AFTER 1 TO 2 WEEKS IF NEEDED  60 tablet  0  . azithromycin (ZITHROMAX) 250 MG tablet Take 2 tablets today then take 1 tablet daily for the next 4 days.  6 tablet  0  . DIFLUCAN 100 MG tablet Take 1 tablet (100 mg total) by mouth daily.  2 tablet  1  . folic acid (FOLVITE) 1 MG tablet Take 1 mg by mouth daily.      Marland Kitchen glucose blood test strip Use to check blood sugars 2 to 3 times daily  Reason uncontrolled DM,  Now on insulin  100 each  12  . insulin NPH (NOVOLIN N) 100 UNIT/ML injection Inject 20 Units into the skin at bedtime. Increase every 3 days as needed  1 vial  12  . Insulin Syringe-Needle U-100 (B-D INS SYR ULTRAFINE 1CC/31G) 31G X 5/16" 1 ML MISC 1 Syringe by Does not apply route daily.  100 each  0  . metoprolol succinate (TOPROL-XL) 50 MG 24 hr tablet Take 1 tablet (50 mg total) by  mouth daily. Take with or immediately following a meal.  30 tablet  11  . nystatin (MYCOSTATIN) powder Apply topically 2 (two) times daily.  15 g  3  . verapamil  (CALAN-SR) 120 MG CR tablet TAKE ONE TABLET BY MOUTH   NIGHTLY AT BEDTIME  30 tablet  0   No facility-administered encounter medications on file as of 06/25/2013.     Orders Placed This Encounter  Procedures  . [facility] MAMMOGRAPHY  . Lipid panel  . Hemoglobin A1c  . Microalbumin / creatinine urine ratio  . Comprehensive metabolic panel  . LDL cholesterol, direct  . HM DIABETES EYE EXAM  . HM DIABETES FOOT EXAM    Return in about 3 months (around 09/25/2013).

## 2013-06-26 ENCOUNTER — Other Ambulatory Visit: Payer: Self-pay | Admitting: Internal Medicine

## 2013-06-26 MED ORDER — SAXAGLIPTIN-METFORMIN ER 2.5-1000 MG PO TB24: ORAL_TABLET | ORAL | Status: DC

## 2013-06-26 NOTE — Telephone Encounter (Signed)
Medication filled.  

## 2013-06-27 ENCOUNTER — Encounter: Payer: Self-pay | Admitting: Internal Medicine

## 2013-06-27 DIAGNOSIS — B372 Candidiasis of skin and nail: Secondary | ICD-10-CM | POA: Insufficient documentation

## 2013-06-27 NOTE — Assessment & Plan Note (Signed)
Secondary to uncontrolled diabetes. Secondary to uncontrolled diabetes. Fluconazole x2 days. Nystatin powder to use 2-3 times daily as needed during summer.

## 2013-06-27 NOTE — Assessment & Plan Note (Signed)
I have addressed  BMI and recommended a low glycemic index diet utilizing smaller more frequent meals to increase metabolism.  I have also recommended that patient start exercising with a goal of 30 minutes of aerobic exercise a minimum of 5 days per week.  

## 2013-06-27 NOTE — Assessment & Plan Note (Signed)
Uncontrolled by current A1c of 10.3. We are starting NPH insulin 20 units at bedtime. She will increase every 3 days until her fasting blood sugars are below 120. Continue oral medications. Exam was normal today except for evidence of lateral peroneal nerve compression.

## 2013-06-27 NOTE — Assessment & Plan Note (Signed)
Managed with nightly use of CPAP.

## 2013-06-27 NOTE — Assessment & Plan Note (Signed)
Improved with recent lidocaine catheterization of sinuses. Her headaches are being managed by a specialist in [place] [facility].

## 2013-06-27 NOTE — Assessment & Plan Note (Signed)
Elevated as well due to uncontrolled DM and diet. . We will repeat her lipids in 3 months and start statin therapy at that time if her LDL remains above 100.

## 2013-07-02 ENCOUNTER — Ambulatory Visit: Payer: Self-pay

## 2013-07-09 ENCOUNTER — Other Ambulatory Visit: Payer: Self-pay | Admitting: Internal Medicine

## 2013-07-15 ENCOUNTER — Encounter: Payer: Self-pay | Admitting: Cardiovascular Disease

## 2013-07-15 ENCOUNTER — Ambulatory Visit (INDEPENDENT_AMBULATORY_CARE_PROVIDER_SITE_OTHER): Payer: BC Managed Care – PPO | Admitting: Cardiovascular Disease

## 2013-07-15 VITALS — BP 148/80 | HR 85 | Ht 64.0 in | Wt 213.5 lb

## 2013-07-15 DIAGNOSIS — R0989 Other specified symptoms and signs involving the circulatory and respiratory systems: Secondary | ICD-10-CM

## 2013-07-15 DIAGNOSIS — R000 Tachycardia, unspecified: Secondary | ICD-10-CM

## 2013-07-15 DIAGNOSIS — R42 Dizziness and giddiness: Secondary | ICD-10-CM

## 2013-07-15 DIAGNOSIS — R0600 Dyspnea, unspecified: Secondary | ICD-10-CM | POA: Insufficient documentation

## 2013-07-15 DIAGNOSIS — R0609 Other forms of dyspnea: Secondary | ICD-10-CM

## 2013-07-15 DIAGNOSIS — R002 Palpitations: Secondary | ICD-10-CM

## 2013-07-15 DIAGNOSIS — I4711 Inappropriate sinus tachycardia, so stated: Secondary | ICD-10-CM | POA: Insufficient documentation

## 2013-07-15 NOTE — Assessment & Plan Note (Signed)
This seems to be due to sinus tachycardia which is happening in spite of taking metoprolol extended release 100 mg once daily. I suspect that recent anxiety and excessive caffeine use is contributing to this. Due to her symptoms of palpitations, I will obtain a 48-hour Holter monitor to ensure no other associated arrhythmia. I strongly advised her to cut down on caffeine intake.

## 2013-07-15 NOTE — Patient Instructions (Addendum)
Your physician has recommended that you wear a holter monitor. Holter monitors are medical devices that record the heart's electrical activity. Doctors most often use these monitors to diagnose arrhythmias. Arrhythmias are problems with the speed or rhythm of the heartbeat. The monitor is a small, portable device. You can wear one while you do your normal daily activities. This is usually used to diagnose what is causing palpitations/syncope (passing out).  Your physician has requested that you have an echocardiogram. Echocardiography is a painless test that uses sound waves to create images of your heart. It provides your doctor with information about the size and shape of your heart and how well your heart's chambers and valves are working. This procedure takes approximately one hour. There are no restrictions for this procedure.  Follow up as needed.  

## 2013-07-15 NOTE — Assessment & Plan Note (Addendum)
I suspect that her dyspnea is likely multifactorial due to physical deconditioning and reported history of asthma. I will request an echocardiogram to ensure no structural heart abnormalities.

## 2013-07-15 NOTE — Progress Notes (Signed)
HPI  This is a 42 year old female who was referred by neurologist Dr. Leandro Reasoner for evaluation of palpitations and tachycardia. She has chronic medical conditions that include diabetes mellitus type 2, morbid obesity, status post gastric bypass, and chronic daily headaches . She has been under increased stress lately with poor control of diabetes. Her headaches are daily. She has been noted to have relatively high resting heart rate and was started on metoprolol with the idea of increasing frequency of her migraines. In spite of taking 100 mg once daily, and heart rate continues to be a relatively high. She has mild palpitations. She denies chest pain. She does complain of dyspnea. There has been no syncope or presyncope. TSH was checked recently and was normal. She does consume 3-4 cups of coffee a day as well as multiple soda drinks. She is under significant stress at home.  Allergies  Allergen Reactions  . Sulfa Drugs Cross Reactors Anaphylaxis  . Adhesive (Tape)   . Amoxicillin Hives  . Clarithromycin   . Honey   . Watermelon Concentrate      Current Outpatient Prescriptions on File Prior to Visit  Medication Sig Dispense Refill  . acetaminophen (TYLENOL) 325 MG tablet Take 650 mg by mouth every 6 (six) hours as needed.      Marland Kitchen buPROPion (WELLBUTRIN XL) 300 MG 24 hr tablet Take 300 mg by mouth daily.      . Cholecalciferol (VITAMIN D3) 2000 UNITS TABS Take by mouth.      Marland Kitchen DIFLUCAN 100 MG tablet Take 1 tablet (100 mg total) by mouth daily.  2 tablet  1  . EPINEPHrine (EPIPEN) 0.3 mg/0.3 mL DEVI Inject 0.3 mLs (0.3 mg total) into the muscle once.  1 Device  5  . fluticasone (FLONASE) 50 MCG/ACT nasal spray USE TWO SPRAYS IN EACH NOSTRIL DAILY  16 g  5  . furosemide (LASIX) 20 MG tablet Take 1 tablet (20 mg total) by mouth daily as needed.  90 tablet  3  . gabapentin (NEURONTIN) 300 MG capsule Take 1,800 mg by mouth Nightly.      Marland Kitchen glipiZIDE (GLUCOTROL) 5 MG tablet Take 5 mg by  mouth 2 (two) times daily before a meal.      . glucose blood test strip Use to check blood sugars 2 to 3 times daily  Reason uncontrolled DM,  Now on insulin  100 each  12  . HYDROcodone-acetaminophen (NORCO/VICODIN) 5-325 MG per tablet Take 1 tablet by mouth every 6 (six) hours as needed for pain.  30 tablet  3  . insulin NPH (NOVOLIN N) 100 UNIT/ML injection Inject 20 Units into the skin at bedtime. Increase every 3 days as needed  1 vial  12  . Insulin Syringe-Needle U-100 (B-D INS SYR ULTRAFINE 1CC/31G) 31G X 5/16" 1 ML MISC 1 Syringe by Does not apply route daily.  100 each  0  . ketorolac (TORADOL) 10 MG tablet Take 10 mg by mouth every 6 (six) hours as needed for pain.      Marland Kitchen KOMBIGLYZE XR 2.04-999 MG TB24 TAKE ONE TABLET BY MOUTH ONCE DAILY MAY INCREAST TO TWO TABLETS BY MOUTH ONCE DAILY AFTER 1 TO 2 WEEKS IF NEEDED  60 tablet  5  . lamoTRIgine (LAMICTAL) 150 MG tablet Take 150 mg by mouth daily.      Demetra Shiner Device MISC Test blood sugar two times daily  100 each  6  . meclizine (ANTIVERT) 25 MG tablet Take 1  tablet (25 mg total) by mouth 3 (three) times daily as needed.  90 tablet  6  . azithromycin (ZITHROMAX) 250 MG tablet Take 2 tablets today then take 1 tablet daily for the next 4 days.  6 tablet  0  . folic acid (FOLVITE) 1 MG tablet Take 1 mg by mouth daily.      . methocarbamol (ROBAXIN) 500 MG tablet TAKE ONE TABLET BY MOUTH FOUR TIMES DAILY  90 tablet  4  . metoprolol succinate (TOPROL-XL) 50 MG 24 hr tablet Take 1 tablet (50 mg total) by mouth daily. Take with or immediately following a meal.  30 tablet  11  . nystatin (MYCOSTATIN) powder Apply topically 2 (two) times daily.  15 g  3  . PHENADOZ 25 MG suppository Insert one suppository rectally three times daily as needed  12 suppository  1  . potassium chloride (MICRO-K) 10 MEQ CR capsule TAKE TWO CAPSULES BY MOUTH DAILY  90 capsule  2  . Prenatal Vit-Fe Fumarate-FA (PRENATAL MULTIVITAMIN) TABS Take 1 tablet by mouth daily.       . Saxagliptin-Metformin (KOMBIGLYZE XR) 2.04-999 MG TB24 TAKE ONE TABLET BY MOUTH ONCE DAILY MAY INCREASE TO TWO TABLETS DAILY AFTER 1 TO 2 WEEKS IF NEEDED  60 tablet  0  . SUMAtriptan (IMITREX) 100 MG tablet TAKE ONE TABLET BY MOUTH EVERY TWO HOURS AS NEEDED  9 tablet  10  . Topiramate (TROKENDI XR PO) Pt will be on 25mg , then 50mg , then 75mg , finally 100mg  in the duration of a  Month.      . traMADol (ULTRAM) 50 MG tablet TAKE ONE TABLET BY MOUTH EVERY 6 HOURS AS NEEDED FOR RELIEF FROM PAIN  90 tablet  1  . VENTOLIN HFA 108 (90 BASE) MCG/ACT inhaler INHALE ONE PUFF BY MOUTH EVERY SIX HOURS AS NEEDED FOR WHEEZING  18 each  4  . verapamil (CALAN-SR) 120 MG CR tablet TAKE ONE TABLET BY MOUTH   NIGHTLY AT BEDTIME  30 tablet  0   No current facility-administered medications on file prior to visit.     Past Medical History  Diagnosis Date  . Diabetes mellitus   . Morbid obesity     s/p gastric bypass  . Anxiety   . Headache, common migraine, intractable   . Hyperlipidemia   . Thyroid disease   . Depression 2010  . Anemia   . Rosacea   . Kidney stones     left  . Personal hx of gastric bypass 2010  . Vertigo      Past Surgical History  Procedure Laterality Date  . Gastric bypass    . Dilation and curettage of uterus  2011  . Ovarian cyst removal    . Rotator cuff repair      right     Family History  Problem Relation Age of Onset  . Diabetes Mother   . Hypertension Mother   . Cancer Neg Hx   . Depression Other     strong hx of  . Hypertension Father   . Diabetes Father      History   Social History  . Marital Status: Married    Spouse Name: N/A    Number of Children: N/A  . Years of Education: N/A   Occupational History  . Not on file.   Social History Main Topics  . Smoking status: Former Smoker -- 1.00 packs/day for 4 years    Types: Cigarettes    Quit date: 02/05/1996  .  Smokeless tobacco: Never Used  . Alcohol Use: Yes     Comment: Once a year.    . Drug Use: No  . Sexually Active: Not on file   Other Topics Concern  . Not on file   Social History Narrative  . No narrative on file     ROS Constitutional: Negative for fever, chills, diaphoresis, activity change, appetite change and fatigue.  HENT: Negative for hearing loss, nosebleeds, congestion, sore throat, facial swelling, drooling, trouble swallowing, neck pain, voice change, sinus pressure and tinnitus.  Eyes: Negative for photophobia, pain, discharge and visual disturbance.  Respiratory: Negative for apnea, cough, chest tightness  and wheezing.  Cardiovascular: Negative for leg swelling.  Gastrointestinal: Negative for nausea, vomiting, abdominal pain, diarrhea, constipation, blood in stool and abdominal distention.  Genitourinary: Negative for dysuria, urgency, frequency, hematuria and decreased urine volume.  Musculoskeletal: Negative for myalgias, back pain, joint swelling, arthralgias and gait problem.  Skin: Negative for color change, pallor, rash and wound.  Neurological: Negative for dizziness, tremors, seizures, syncope, speech difficulty, weakness, light-headedness, numbness and headaches.  Psychiatric/Behavioral: Negative for suicidal ideas, hallucinations, behavioral problems and agitation. The patient is  nervous/anxious.     PHYSICAL EXAM   BP 148/80  Ht 5\' 4"  (1.626 m)  Wt 213 lb 8 oz (96.843 kg)  BMI 36.63 kg/m2  SpO2 96%  LMP 06/21/2013 Constitutional: She is oriented to person, place, and time. She appears well-developed and well-nourished. No distress.  HENT: No nasal discharge.  Head: Normocephalic and atraumatic.  Eyes: Pupils are equal and round. Right eye exhibits no discharge. Left eye exhibits no discharge.  Neck: Normal range of motion. Neck supple. No JVD present. No thyromegaly present.  Cardiovascular: Normal rate, regular rhythm, normal heart sounds. Exam reveals no gallop and no friction rub. No murmur heard.  Pulmonary/Chest:  Effort normal and breath sounds normal. No stridor. No respiratory distress. She has no wheezes. She has no rales. She exhibits no tenderness.  Abdominal: Soft. Bowel sounds are normal. She exhibits no distension. There is no tenderness. There is no rebound and no guarding.  Musculoskeletal: Normal range of motion. She exhibits no edema and no tenderness.  Neurological: She is alert and oriented to person, place, and time. Coordination normal.  Skin: Skin is warm and dry. No rash noted. She is not diaphoretic. No erythema. No pallor.  Psychiatric: She has a normal mood and affect. Her behavior is normal. Judgment and thought content normal.     EKG: Sinus  Rhythm  -Left axis.  Borderline left ventricular hypertrophy ABNORMAL    ASSESSMENT AND PLAN

## 2013-07-18 ENCOUNTER — Other Ambulatory Visit: Payer: Self-pay

## 2013-07-18 ENCOUNTER — Other Ambulatory Visit (INDEPENDENT_AMBULATORY_CARE_PROVIDER_SITE_OTHER): Payer: BC Managed Care – PPO

## 2013-07-18 DIAGNOSIS — R0609 Other forms of dyspnea: Secondary | ICD-10-CM

## 2013-07-18 DIAGNOSIS — R0600 Dyspnea, unspecified: Secondary | ICD-10-CM

## 2013-07-21 ENCOUNTER — Telehealth: Payer: Self-pay | Admitting: Cardiovascular Disease

## 2013-07-21 ENCOUNTER — Encounter: Payer: Self-pay | Admitting: Internal Medicine

## 2013-07-21 MED ORDER — ATORVASTATIN CALCIUM 20 MG PO TABS: 20.0000 mg | ORAL_TABLET | Freq: Every day | ORAL | Status: DC

## 2013-07-21 NOTE — Telephone Encounter (Signed)
Follow Up     Pt calling following up on ECHO results. Please call.

## 2013-07-21 NOTE — Telephone Encounter (Signed)
Returned call to patient 07/18/13 echo normal.

## 2013-07-27 LAB — HM DIABETES EYE EXAM

## 2013-07-28 ENCOUNTER — Telehealth: Payer: Self-pay

## 2013-07-28 NOTE — Telephone Encounter (Signed)
Spoke with Consuella Lose at [facility]; Consuella Lose states she will call the patient again to schedule the monitor placement. The patient was told to call back per Consuella Lose with [facility] to decide when is the best time for her to have monitor placed.

## 2013-07-28 NOTE — Telephone Encounter (Signed)
LMOM that Maria Bentley from [facility] will be calling her to schedule this monitor.

## 2013-07-28 NOTE — Telephone Encounter (Signed)
Pt states she has not heard anything from [facility] regarding her holter. Please call. Pt states it is ok to leave msg on her vm, she will be in meetings all day.

## 2013-08-08 ENCOUNTER — Telehealth: Payer: Self-pay | Admitting: *Deleted

## 2013-08-08 NOTE — Telephone Encounter (Signed)
pt notified about monitor results with verbal understanding to results

## 2013-08-12 DIAGNOSIS — R002 Palpitations: Secondary | ICD-10-CM

## 2013-08-14 ENCOUNTER — Telehealth: Payer: Self-pay | Admitting: Internal Medicine

## 2013-08-14 NOTE — Telephone Encounter (Signed)
Pt wanting to speak to a nurse about a problem she is having.  Did not specify problem to front staff.  Please call pt on home phone.

## 2013-08-14 NOTE — Telephone Encounter (Signed)
Patient called back and schedule an appointment with Orville Govern NP. For 08/15/13 patient stated she does not know what is wrong but that she does not feell right .

## 2013-08-15 ENCOUNTER — Telehealth: Payer: Self-pay | Admitting: Adult Health

## 2013-08-15 ENCOUNTER — Ambulatory Visit (INDEPENDENT_AMBULATORY_CARE_PROVIDER_SITE_OTHER): Payer: BC Managed Care – PPO | Admitting: Adult Health

## 2013-08-15 ENCOUNTER — Encounter: Payer: Self-pay | Admitting: Adult Health

## 2013-08-15 ENCOUNTER — Other Ambulatory Visit: Payer: Self-pay | Admitting: Internal Medicine

## 2013-08-15 VITALS — BP 108/80 | HR 108 | Temp 98.1°F | Resp 14 | Ht 65.0 in | Wt 204.5 lb

## 2013-08-15 DIAGNOSIS — IMO0002 Reserved for concepts with insufficient information to code with codable children: Secondary | ICD-10-CM | POA: Insufficient documentation

## 2013-08-15 DIAGNOSIS — B370 Candidal stomatitis: Secondary | ICD-10-CM | POA: Insufficient documentation

## 2013-08-15 DIAGNOSIS — G479 Sleep disorder, unspecified: Secondary | ICD-10-CM

## 2013-08-15 DIAGNOSIS — R000 Tachycardia, unspecified: Secondary | ICD-10-CM | POA: Insufficient documentation

## 2013-08-15 DIAGNOSIS — IMO0001 Reserved for inherently not codable concepts without codable children: Secondary | ICD-10-CM

## 2013-08-15 DIAGNOSIS — E1165 Type 2 diabetes mellitus with hyperglycemia: Secondary | ICD-10-CM | POA: Insufficient documentation

## 2013-08-15 DIAGNOSIS — Z79899 Other long term (current) drug therapy: Secondary | ICD-10-CM

## 2013-08-15 DIAGNOSIS — R3915 Urgency of urination: Secondary | ICD-10-CM

## 2013-08-15 LAB — URINALYSIS, ROUTINE W REFLEX MICROSCOPIC
Total Protein, Urine: 100
pH: 6 (ref 5.0–8.0)

## 2013-08-15 LAB — CBC WITH DIFFERENTIAL/PLATELET
Basophils Absolute: 0 10*3/uL (ref 0.0–0.1)
Eosinophils Absolute: 0.1 10*3/uL (ref 0.0–0.7)
HCT: 45.1 % (ref 36.0–46.0)
Lymphs Abs: 3.1 10*3/uL (ref 0.7–4.0)
MCHC: 32.8 g/dL (ref 30.0–36.0)
MCV: 94.2 fl (ref 78.0–100.0)
Monocytes Absolute: 0.8 10*3/uL (ref 0.1–1.0)
Monocytes Relative: 5.3 % (ref 3.0–12.0)
Platelets: 413 10*3/uL — ABNORMAL HIGH (ref 150.0–400.0)
RDW: 15.4 % — ABNORMAL HIGH (ref 11.5–14.6)

## 2013-08-15 LAB — COMPREHENSIVE METABOLIC PANEL
ALT: 12 U/L (ref 0–35)
AST: 13 U/L (ref 0–37)
Alkaline Phosphatase: 72 U/L (ref 39–117)
Sodium: 137 mEq/L (ref 135–145)
Total Bilirubin: 0.6 mg/dL (ref 0.3–1.2)
Total Protein: 7.4 g/dL (ref 6.0–8.3)

## 2013-08-15 MED ORDER — NYSTATIN 100000 UNIT/ML MT SUSP: OROMUCOSAL | Status: DC

## 2013-08-15 MED ORDER — METOPROLOL SUCCINATE ER 50 MG PO TB24: 100.0000 mg | ORAL_TABLET | Freq: Every day | ORAL | Status: DC

## 2013-08-15 MED ORDER — CIPROFLOXACIN HCL 250 MG PO TABS: 250.0000 mg | ORAL_TABLET | Freq: Two times a day (BID) | ORAL | Status: DC

## 2013-08-15 NOTE — Assessment & Plan Note (Addendum)
UA dipstick showing possible urinary tract infection. Send urine for culture. Check CBC and metabolic panel. Start Cipro 250 mg twice a day x5 days.

## 2013-08-15 NOTE — Progress Notes (Signed)
Subjective:    Patient ID: Maria Bentley, female    DOB: 1971/09/19, 42 y.o.   MRN: [id number]  HPI  Patient is a 42 year old female with multiple medical problems including diabetes mellitus type 2 uncontrolled, hyperlipidemia, sleep apnea, obesity, status post gastric bypass who presents to clinic with complaints of trouble sleeping since last Thursday or Friday. Patient also complains of having elevated heart rate and that her blood sugars have been elevated despite using NPH as directed at bedtime. She is reporting her blood sugars are ranging from the mid 200s to mid 300s. During visit she checked her blood glucose level with her own glucometer which showed a blood glucose of 325. Patient stated that she was fasting; however, shortly after she admitted to eating a smores earlier that morning. She had stated that she was taking her medication exactly as ordered; however, patient then reported she had not taken her NPH last evening and she had not taken any of her morning medications prior to coming to clinic secondary to "not having time". Patient is feeling shaky and she reports just not feeling good. She is also having some symptoms of her urgency without any dysuria.  Patient was seen by Dr. Kirke Corin on 07/15/2013 for rapid heart rate, palpitations and dyspnea. She is status post a 48 hour monitor showing no significant arrhythmia and an echo which was normal. Her dyspnea was thought to be multifactorial including increased anxiety and deconditioning. Patient is supposed to be taking metoprolol extended release 100 mg daily; however, this medication was showing refills had expired on 07/15/2013. She was also advised to decrease her caffeine intake as this was thought to be a contributor to her palpitations and tachycardia. Patient came to clinic with a diet [employer] which is one of the highest caffeinated sodas available in the market. She stressed that this was her only soda for the  day.   Current Outpatient Prescriptions on File Prior to Visit  Medication Sig Dispense Refill  . acetaminophen (TYLENOL) 325 MG tablet Take 650 mg by mouth every 6 (six) hours as needed.      Marland Kitchen atorvastatin (LIPITOR) 20 MG tablet Take 1 tablet (20 mg total) by mouth daily.  90 tablet  3  . buPROPion (WELLBUTRIN XL) 300 MG 24 hr tablet Take 300 mg by mouth daily.      . Cholecalciferol (VITAMIN D3) 2000 UNITS TABS Take by mouth.      . EPINEPHrine (EPIPEN) 0.3 mg/0.3 mL DEVI Inject 0.3 mLs (0.3 mg total) into the muscle once.  1 Device  5  . fluticasone (FLONASE) 50 MCG/ACT nasal spray USE TWO SPRAYS IN EACH NOSTRIL DAILY  16 g  5  . furosemide (LASIX) 20 MG tablet Take 1 tablet (20 mg total) by mouth daily as needed.  90 tablet  3  . gabapentin (NEURONTIN) 300 MG capsule Take 1,800 mg by mouth Nightly.      Marland Kitchen glipiZIDE (GLUCOTROL) 5 MG tablet Take 5 mg by mouth 2 (two) times daily before a meal.      . HYDROcodone-acetaminophen (NORCO/VICODIN) 5-325 MG per tablet Take 1 tablet by mouth every 6 (six) hours as needed for pain.  30 tablet  3  . insulin NPH (NOVOLIN N) 100 UNIT/ML injection Inject 20 Units into the skin at bedtime. Increase every 3 days as needed  1 vial  12  . Insulin Syringe-Needle U-100 (B-D INS SYR ULTRAFINE 1CC/31G) 31G X 5/16" 1 ML MISC 1 Syringe by Does not  apply route daily.  100 each  0  . ketorolac (TORADOL) 10 MG tablet Take 10 mg by mouth every 6 (six) hours as needed for pain.      Marland Kitchen KOMBIGLYZE XR 2.04-999 MG TB24 TAKE ONE TABLET BY MOUTH ONCE DAILY MAY INCREAST TO TWO TABLETS BY MOUTH ONCE DAILY AFTER 1 TO 2 WEEKS IF NEEDED  60 tablet  5  . lamoTRIgine (LAMICTAL) 150 MG tablet Take 150 mg by mouth daily.      Demetra Shiner Device MISC Test blood sugar two times daily  100 each  6  . meclizine (ANTIVERT) 25 MG tablet Take 1 tablet (25 mg total) by mouth 3 (three) times daily as needed.  90 tablet  6  . methocarbamol (ROBAXIN) 500 MG tablet TAKE ONE TABLET BY MOUTH FOUR  TIMES DAILY  90 tablet  4  . nystatin (MYCOSTATIN) powder Apply topically 2 (two) times daily.  15 g  3  . PHENADOZ 25 MG suppository Insert one suppository rectally three times daily as needed  12 suppository  1  . potassium chloride (MICRO-K) 10 MEQ CR capsule TAKE TWO CAPSULES BY MOUTH DAILY  90 capsule  2  . Prenatal Vit-Fe Fumarate-FA (PRENATAL MULTIVITAMIN) TABS Take 1 tablet by mouth daily.      . Saxagliptin-Metformin (KOMBIGLYZE XR) 2.04-999 MG TB24 TAKE ONE TABLET BY MOUTH ONCE DAILY MAY INCREASE TO TWO TABLETS DAILY AFTER 1 TO 2 WEEKS IF NEEDED  60 tablet  0  . SUMAtriptan (IMITREX) 100 MG tablet TAKE ONE TABLET BY MOUTH EVERY TWO HOURS AS NEEDED  9 tablet  10  . Topiramate (TROKENDI XR PO) Pt will be on 25mg , then 50mg , then 75mg , finally 100mg  in the duration of a  Month.      . traMADol (ULTRAM) 50 MG tablet TAKE ONE TABLET BY MOUTH EVERY 6 HOURS AS NEEDED FOR RELIEF FROM PAIN  90 tablet  1  . VENTOLIN HFA 108 (90 BASE) MCG/ACT inhaler INHALE ONE PUFF BY MOUTH EVERY SIX HOURS AS NEEDED FOR WHEEZING  18 each  4   No current facility-administered medications on file prior to visit.     Review of Systems  Constitutional:       She reports "I just don't feel well".  HENT: Negative.   Respiratory: Negative.   Cardiovascular:       Fast HR  Gastrointestinal: Negative.   Endocrine: Negative.   Genitourinary: Positive for urgency. Negative for dysuria, flank pain and difficulty urinating.  Musculoskeletal: Negative.   Skin: Negative.   Neurological: Negative for dizziness, syncope, light-headedness and headaches.       Shaky   Psychiatric/Behavioral: Positive for sleep disturbance. Negative for behavioral problems and confusion. The patient is nervous/anxious.   All other systems reviewed and are negative.    BP 108/80  Pulse 108  Temp(Src) 98.1 F (36.7 C) (Oral)  Resp 14  Ht 5\' 5"  (1.651 m)  Wt 204 lb 8 oz (92.761 kg)  BMI 34.03 kg/m2  SpO2 96%    Objective:    Physical Exam  Constitutional: She is oriented to person, place, and time.  42 year old female in no apparent distress.   HENT:  Head: Normocephalic and atraumatic.  Right Ear: External ear normal.  Left Ear: External ear normal.  Tongue with Candida  Eyes: Conjunctivae and EOM are normal. Pupils are equal, round, and reactive to light.  Neck: Normal range of motion. Neck supple.  Cardiovascular: Regular rhythm, normal heart sounds and intact distal  pulses.  Exam reveals no gallop and no friction rub.   No murmur heard. Tachycardia, heart rate 110  Pulmonary/Chest: Effort normal and breath sounds normal. No respiratory distress. She has no wheezes. She has no rales. She exhibits no tenderness.  Abdominal: Soft. Bowel sounds are normal.  Lymphadenopathy:    She has no cervical adenopathy.  Neurological: She is alert and oriented to person, place, and time. No cranial nerve deficit. Coordination normal.  Skin: Skin is warm and dry.  Psychiatric:  She is exhibiting very rapid speech with inability to focus. She is jumping from one symptom to another. Contradictory speech as well. She has made multiple statements and then back tracked stating the opposite.      Assessment & Plan:

## 2013-08-15 NOTE — Assessment & Plan Note (Signed)
Patient has history of sleep apnea. Would prefer that she try something natural for sleep disturbance such as melatonin or valerian root. I suspect that her symptoms are worsened by her caffeine intake which is also triggering tachycardia. Patient is agreeing to try either melatonin or valerian root. She also states that she is working on decreasing her caffeine intake.

## 2013-08-15 NOTE — Assessment & Plan Note (Signed)
Start nystatin swish and spit 5 mLs 4 times a day.

## 2013-08-15 NOTE — Telephone Encounter (Signed)
Eprescribed.

## 2013-08-15 NOTE — Assessment & Plan Note (Addendum)
Suspect that patient has not been taking her medication as prescribed. Sent prescription to pharmacy for metoprolol extended release 100 mg. Instructed patient to take this medication on a daily basis to help her tachycardia. Needs to eliminate all caffeine as this is contributing to her rapid heart rate and inability to sleep.

## 2013-08-15 NOTE — Assessment & Plan Note (Signed)
Blood glucose readings remain elevated. Per patient record, they are running anywhere between mid 250s to mid 350s. She has been noncompliant with both medication and diet. Offered diabetic nutrition education through [employer]. Patient stated that it was not lack of knowledge. She is aware what she is supposed to eat. She is declining this service at this time. Needs to followup with PCP in approximately 2-4 weeks.

## 2013-08-15 NOTE — Telephone Encounter (Signed)
Message left for pt regarding lab results showing infection and medication sent to pharmacy

## 2013-08-22 ENCOUNTER — Other Ambulatory Visit: Payer: Self-pay | Admitting: Internal Medicine

## 2013-08-25 ENCOUNTER — Telehealth: Payer: Self-pay | Admitting: *Deleted

## 2013-08-25 ENCOUNTER — Other Ambulatory Visit: Payer: Self-pay | Admitting: *Deleted

## 2013-08-25 DIAGNOSIS — Z9884 Bariatric surgery status: Secondary | ICD-10-CM

## 2013-08-25 DIAGNOSIS — T782XXS Anaphylactic shock, unspecified, sequela: Secondary | ICD-10-CM

## 2013-08-25 MED ORDER — EPINEPHRINE 0.3 MG/0.3ML IJ SOAJ: 0.3000 mg | Freq: Once | INTRAMUSCULAR | Status: DC

## 2013-08-25 NOTE — Telephone Encounter (Signed)
Refill Request  Epipen 2-pak 0.3 mg inj  #2  Use for allergic reaction   

## 2013-08-25 NOTE — Telephone Encounter (Signed)
Refill Request  Epipen 2-pak 0.3 mg inj  #2  Use for allergic reaction

## 2013-08-25 NOTE — Telephone Encounter (Signed)
rx sent for epipen with refills

## 2013-09-05 ENCOUNTER — Ambulatory Visit: Payer: Self-pay

## 2013-09-05 LAB — CREATININE, SERUM
EGFR (African American): >60
EGFR (Non-African Amer.): >60

## 2013-09-09 ENCOUNTER — Encounter: Payer: Self-pay | Admitting: Internal Medicine

## 2013-10-13 ENCOUNTER — Other Ambulatory Visit: Payer: Self-pay | Admitting: *Deleted

## 2013-10-13 DIAGNOSIS — IMO0001 Reserved for inherently not codable concepts without codable children: Secondary | ICD-10-CM

## 2013-10-13 MED ORDER — "INSULIN SYRINGE-NEEDLE U-100 31G X 5/16"" 1 ML MISC": 1.0000 | Freq: Every day | Status: DC

## 2013-10-13 NOTE — Telephone Encounter (Signed)
Eprescribed.

## 2013-10-16 ENCOUNTER — Other Ambulatory Visit: Payer: Self-pay

## 2013-10-27 ENCOUNTER — Other Ambulatory Visit: Payer: Self-pay | Admitting: Internal Medicine

## 2013-11-01 ENCOUNTER — Other Ambulatory Visit: Payer: Self-pay | Admitting: Internal Medicine

## 2013-11-03 ENCOUNTER — Other Ambulatory Visit: Payer: Self-pay | Admitting: *Deleted

## 2013-11-03 NOTE — Telephone Encounter (Signed)
No refills on phenergan until she is seen for diabetes followup

## 2013-11-03 NOTE — Telephone Encounter (Signed)
Ok refill? 

## 2013-11-13 ENCOUNTER — Other Ambulatory Visit: Payer: Self-pay | Admitting: Internal Medicine

## 2013-11-16 ENCOUNTER — Other Ambulatory Visit: Payer: Self-pay | Admitting: Internal Medicine

## 2013-12-01 ENCOUNTER — Other Ambulatory Visit: Payer: Self-pay | Admitting: Internal Medicine

## 2013-12-16 ENCOUNTER — Ambulatory Visit (INDEPENDENT_AMBULATORY_CARE_PROVIDER_SITE_OTHER): Payer: BC Managed Care – PPO | Admitting: Adult Health

## 2013-12-16 ENCOUNTER — Encounter: Payer: Self-pay | Admitting: Adult Health

## 2013-12-16 VITALS — BP 120/72 | HR 93 | Temp 98.0°F | Resp 12 | Wt 223.0 lb

## 2013-12-16 DIAGNOSIS — R109 Unspecified abdominal pain: Secondary | ICD-10-CM

## 2013-12-16 DIAGNOSIS — R300 Dysuria: Secondary | ICD-10-CM | POA: Insufficient documentation

## 2013-12-16 LAB — POCT URINALYSIS DIPSTICK
Bilirubin, UA: NEGATIVE
Glucose, UA: NEGATIVE
Ketones, UA: NEGATIVE
Nitrite, UA: NEGATIVE
Protein, UA: NEGATIVE
SPEC GRAV UA: 1.015
UROBILINOGEN UA: 0.2
pH, UA: 5.5

## 2013-12-16 MED ORDER — CIPROFLOXACIN HCL 250 MG PO TABS: 250.0000 mg | ORAL_TABLET | Freq: Two times a day (BID) | ORAL | Status: DC

## 2013-12-16 MED ORDER — PROMETHAZINE HCL 25 MG RE SUPP: 25.0000 mg | Freq: Three times a day (TID) | RECTAL | Status: DC | PRN

## 2013-12-16 MED ORDER — PHENAZOPYRIDINE HCL 200 MG PO TABS: 200.0000 mg | ORAL_TABLET | Freq: Three times a day (TID) | ORAL | Status: DC | PRN

## 2013-12-16 NOTE — Progress Notes (Signed)
Subjective:    Patient ID: Maria Bentley, female    DOB: February 16, 1971, 43 y.o.   MRN: [id number]  HPI Patient is a pleasant 43 year old female who presents to clinic with dysuria. Symptoms began on Thursday. Patient was out of town when symptoms began. She has not taken any over-the-counter medications. Patient reports history of kidney stones in the past. She reports this does not feel the same; however, she is having some back discomfort. No fever or chills or hematuria.  Current Outpatient Prescriptions on File Prior to Visit  Medication Sig Dispense Refill  . acetaminophen (TYLENOL) 325 MG tablet Take 650 mg by mouth every 6 (six) hours as needed.      Marland Kitchen atorvastatin (LIPITOR) 20 MG tablet Take 1 tablet (20 mg total) by mouth daily.  90 tablet  3  . buPROPion (WELLBUTRIN XL) 300 MG 24 hr tablet Take 300 mg by mouth daily.      . Cholecalciferol (VITAMIN D3) 2000 UNITS TABS Take by mouth.      . EPINEPHrine (EPIPEN 2-PAK) 0.3 mg/0.3 mL SOAJ injection Inject 0.3 mLs (0.3 mg total) into the muscle once. For severe allergic reaction  1 Device  5  . fluticasone (FLONASE) 50 MCG/ACT nasal spray USE TWO SPRAYS IN EACH NOSTRIL DAILY   16 g  4  . furosemide (LASIX) 20 MG tablet Take 1 tablet (20 mg total) by mouth daily as needed.  90 tablet  3  . glipiZIDE (GLUCOTROL) 5 MG tablet Take one tablet by mouth twice daily before meals  180 tablet  1  . HYDROcodone-acetaminophen (NORCO/VICODIN) 5-325 MG per tablet Take 1 tablet by mouth every 6 (six) hours as needed for pain.  30 tablet  3  . insulin NPH (NOVOLIN N) 100 UNIT/ML injection Inject 20 Units into the skin at bedtime. Increase every 3 days as needed  1 vial  12  . Insulin Syringe-Needle U-100 (B-D INS SYR ULTRAFINE 1CC/31G) 31G X 5/16" 1 ML MISC 1 Syringe by Does not apply route daily.  100 each  0  . KOMBIGLYZE XR 2.04-999 MG TB24 TAKE ONE TABLET BY MOUTH ONCE DAILY MAY INCREAST TO TWO TABLETS BY MOUTH ONCE DAILY AFTER 1 TO 2 WEEKS IF  NEEDED  60 tablet  5  . lamoTRIgine (LAMICTAL) 150 MG tablet Take 150 mg by mouth daily.      [name] Device MISC Test blood sugar two times daily  100 each  6  . meclizine (ANTIVERT) 25 MG tablet Take 1 tablet (25 mg total) by mouth 3 (three) times daily as needed.  90 tablet  6  . methocarbamol (ROBAXIN) 500 MG tablet TAKE ONE TABLET BY MOUTH FOUR TIMES DAILY   90 tablet  3  . metoprolol succinate (TOPROL-XL) 50 MG 24 hr tablet Take 2 tablets (100 mg total) by mouth daily. Take with or immediately following a meal.  60 tablet  3  . nystatin (MYCOSTATIN) 100000 UNIT/ML suspension Swish and spit 5 mls 4 times a day  60 mL  0  . nystatin (MYCOSTATIN) powder Apply topically 2 (two) times daily.  15 g  3  . ONETOUCH VERIO test strip Test twice a day  50 each  4  . potassium chloride (MICRO-K) 10 MEQ CR capsule TAKE TWO CAPSULES BY MOUTH DAILY  90 capsule  2  . Prenatal Vit-Fe Fumarate-FA (PRENATAL MULTIVITAMIN) TABS Take 1 tablet by mouth daily.      . SUMAtriptan (IMITREX) 100 MG tablet TAKE ONE  TABLET BY MOUTH EVERY TWO HOURS AS NEEDED  9 tablet  10  . traMADol (ULTRAM) 50 MG tablet TAKE ONE TABLET BY MOUTH EVERY 6 HOURS AS NEEDED FOR RELIEF FROM PAIN  90 tablet  1  . VENTOLIN HFA 108 (90 BASE) MCG/ACT inhaler INHALE ONE PUFF BY MOUTH EVERY SIX HOURS AS NEEDED FOR WHEEZING  18 each  4   No current facility-administered medications on file prior to visit.     Review of Systems  Constitutional: Negative for fever and chills.  Genitourinary: Positive for dysuria, urgency, frequency and flank pain. Negative for hematuria.       Objective:   Physical Exam  Constitutional: She is oriented to person, place, and time.  Pleasant 43 year old female in no distress  Cardiovascular: Normal rate and regular rhythm.   Pulmonary/Chest: Effort normal. No respiratory distress.  Genitourinary:  No suprapubic discomfort.  Musculoskeletal:  Lower back discomfort  Neurological: She is alert and oriented  to person, place, and time.  Skin: Skin is warm and dry.  Psychiatric: She has a normal mood and affect. Her behavior is normal. Judgment and thought content normal.   BP 120/72  Pulse 93  Temp(Src) 98 F (36.7 C) (Oral)  Resp 12  Wt 223 lb (101.152 kg)  SpO2 97%        Assessment & Plan:

## 2013-12-16 NOTE — Patient Instructions (Addendum)
  Start Cipro 250 mg twice a day for 5 days.  Pyridium 1 tablet every 8 hours as needed for urinary discomfort. Use no more than 3 days.  Please have your labs drawn prior to leaving the office.  I am sending the urine for culture. Once the results are available we will notify you.  I sent in a prescription for your phenergan suppositories.  Please let us know if your symptoms do not improve.

## 2013-12-16 NOTE — Progress Notes (Signed)
Pre visit review using our clinic review tool, if applicable. No additional management support is needed unless otherwise documented below in the visit note. 

## 2013-12-16 NOTE — Assessment & Plan Note (Signed)
UA dipstick shows possible urinary tract infection. Send urine for culture. Start Cipro 250 mg twice a day x5 days. Pyridium 200 mg one tablet every 8 hours as needed for urinary discomfort.

## 2013-12-17 ENCOUNTER — Encounter: Payer: Self-pay | Admitting: Adult Health

## 2013-12-17 LAB — BASIC METABOLIC PANEL
BUN: 15 mg/dL (ref 6–23)
CO2: 30 mEq/L (ref 19–32)
Calcium: 9.3 mg/dL (ref 8.4–10.5)
Chloride: 103 mEq/L (ref 96–112)
Creatinine, Ser: 1 mg/dL (ref 0.4–1.2)
GFR: 65.7 mL/min (ref >60.00)
Glucose, Bld: 151 mg/dL — ABNORMAL HIGH (ref 70–99)
POTASSIUM: 4.4 meq/L (ref 3.5–5.1)
Sodium: 139 mEq/L (ref 135–145)

## 2013-12-19 ENCOUNTER — Encounter: Payer: Self-pay | Admitting: Adult Health

## 2013-12-19 LAB — URINE CULTURE

## 2013-12-19 NOTE — Telephone Encounter (Signed)
Mailed unread message to pt  

## 2013-12-22 NOTE — Telephone Encounter (Signed)
Mailed unread message to pt  

## 2014-01-06 ENCOUNTER — Other Ambulatory Visit: Payer: Self-pay | Admitting: Adult Health

## 2014-01-06 ENCOUNTER — Other Ambulatory Visit: Payer: Self-pay | Admitting: Internal Medicine

## 2014-01-06 DIAGNOSIS — R000 Tachycardia, unspecified: Secondary | ICD-10-CM

## 2014-01-16 ENCOUNTER — Ambulatory Visit (INDEPENDENT_AMBULATORY_CARE_PROVIDER_SITE_OTHER): Payer: BC Managed Care – PPO | Admitting: Adult Health

## 2014-01-16 ENCOUNTER — Encounter: Payer: Self-pay | Admitting: Adult Health

## 2014-01-16 VITALS — BP 120/70 | HR 83 | Temp 98.2°F | Resp 12 | Wt 215.0 lb

## 2014-01-16 DIAGNOSIS — N390 Urinary tract infection, site not specified: Secondary | ICD-10-CM | POA: Insufficient documentation

## 2014-01-16 LAB — POCT URINALYSIS DIPSTICK
Bilirubin, UA: NEGATIVE
Blood, UA: NEGATIVE
Glucose, UA: 100
KETONES UA: NEGATIVE
NITRITE UA: POSITIVE
PROTEIN UA: 30
Spec Grav, UA: 1.01
UROBILINOGEN UA: 1
pH, UA: 5

## 2014-01-16 MED ORDER — PHENAZOPYRIDINE HCL 200 MG PO TABS: 200.0000 mg | ORAL_TABLET | Freq: Three times a day (TID) | ORAL | Status: DC | PRN

## 2014-01-16 MED ORDER — CIPROFLOXACIN HCL 250 MG PO TABS: 250.0000 mg | ORAL_TABLET | Freq: Two times a day (BID) | ORAL | Status: DC

## 2014-01-16 NOTE — Addendum Note (Signed)
Addended by: Sherryl Barters on: 01/16/2014 02:01 PM   Modules accepted: Orders

## 2014-01-16 NOTE — Progress Notes (Signed)
Pre visit review using our clinic review tool, if applicable. No additional management support is needed unless otherwise documented below in the visit note. 

## 2014-01-16 NOTE — Progress Notes (Signed)
Patient ID: Maria Bentley, female   DOB: 1971-11-16, 43 y.o.   MRN: [id number]    Subjective:    Patient ID: Maria Bentley, female    DOB: 1971-10-23, 43 y.o.   MRN: [id number]  HPI  Pt called today to be seen for symptoms of dysuria x 2 days. Very uncomfortable. Not taken any OTC products. No fever, chills or hematuria.  Past Medical History  Diagnosis Date  . Diabetes mellitus   . Morbid obesity     s/p gastric bypass  . Anxiety   . Headache, common migraine, intractable   . Hyperlipidemia   . Thyroid disease   . Depression 2010  . Anemia   . Rosacea   . Kidney stones     left  . Personal hx of gastric bypass 2010  . Vertigo     Current Outpatient Prescriptions on File Prior to Visit  Medication Sig Dispense Refill  . acetaminophen (TYLENOL) 325 MG tablet Take 650 mg by mouth every 6 (six) hours as needed.      Marland Kitchen atorvastatin (LIPITOR) 20 MG tablet Take 1 tablet (20 mg total) by mouth daily.  90 tablet  3  . buPROPion (WELLBUTRIN XL) 300 MG 24 hr tablet Take 300 mg by mouth daily.      . Cholecalciferol (VITAMIN D3) 2000 UNITS TABS Take by mouth.      . EPINEPHrine (EPIPEN 2-PAK) 0.3 mg/0.3 mL SOAJ injection Inject 0.3 mLs (0.3 mg total) into the muscle once. For severe allergic reaction  1 Device  5  . fluticasone (FLONASE) 50 MCG/ACT nasal spray USE TWO SPRAYS IN EACH NOSTRIL DAILY   16 g  4  . furosemide (LASIX) 20 MG tablet Take 1 tablet (20 mg total) by mouth daily as needed.  90 tablet  3  . glipiZIDE (GLUCOTROL) 5 MG tablet Take one tablet by mouth twice daily before meals  180 tablet  1  . HYDROcodone-acetaminophen (NORCO/VICODIN) 5-325 MG per tablet Take 1 tablet by mouth every 6 (six) hours as needed for pain.  30 tablet  3  . insulin NPH (NOVOLIN N) 100 UNIT/ML injection Inject 20 Units into the skin at bedtime. Increase every 3 days as needed  1 vial  12  . Insulin Syringe-Needle U-100 (B-D INS SYR ULTRAFINE 1CC/31G) 31G X 5/16" 1 ML MISC 1 Syringe by  Does not apply route daily.  100 each  0  . KOMBIGLYZE XR 2.04-999 MG TB24 TAKE ONE TABLET BY MOUTH ONCE DAILY MAY INCREAST TO TWO TABLETS BY MOUTH ONCE DAILY AFTER 1 TO 2 WEEKS IF NEEDED  60 tablet  5  . lamoTRIgine (LAMICTAL) 150 MG tablet Take 150 mg by mouth daily.      [name] Device MISC Test blood sugar two times daily  100 each  6  . meclizine (ANTIVERT) 25 MG tablet Take 1 tablet (25 mg total) by mouth 3 (three) times daily as needed.  90 tablet  6  . methocarbamol (ROBAXIN) 500 MG tablet TAKE ONE TABLET BY MOUTH FOUR TIMES DAILY   90 tablet  3  . metoprolol succinate (TOPROL-XL) 50 MG 24 hr tablet TAKE TWO TABLETS BY MOUTH DAILY   60 tablet  0  . nystatin (MYCOSTATIN) powder Apply topically 2 (two) times daily.  15 g  3  . OnabotulinumtoxinA (BOTOX IJ) Inject as directed every 3 (three) months. For migraine      . ONETOUCH VERIO test strip Test twice a day  50 each  4  . phenazopyridine (PYRIDIUM) 200 MG tablet Take 1 tablet (200 mg total) by mouth 3 (three) times daily as needed for pain.  10 tablet  0  . potassium chloride (MICRO-K) 10 MEQ CR capsule TAKE TWO CAPSULES BY MOUTH DAILY  90 capsule  2  . pregabalin (LYRICA) 50 MG capsule Take 50 mg by mouth as needed.      . Prenatal Vit-Fe Fumarate-FA (PRENATAL MULTIVITAMIN) TABS Take 1 tablet by mouth daily.      . promethazine (PHENADOZ) 25 MG suppository Place 1 suppository (25 mg total) rectally every 8 (eight) hours as needed for nausea or vomiting.  12 suppository  1  . SUMAtriptan (IMITREX) 100 MG tablet TAKE ONE TABLET BY MOUTH EVERY TWO HOURS AS NEEDED  9 tablet  10  . traMADol (ULTRAM) 50 MG tablet TAKE ONE TABLET BY MOUTH EVERY 6 HOURS AS NEEDED FOR RELIEF FROM PAIN  90 tablet  1  . traZODone (DESYREL) 50 MG tablet Take 50 mg by mouth at bedtime as needed.       . VENTOLIN HFA 108 (90 BASE) MCG/ACT inhaler INHALE ONE PUFF BY MOUTH EVERY SIX HOURS AS NEEDED FOR WHEEZING  18 each  4   No current facility-administered  medications on file prior to visit.     Review of Systems  Constitutional: Negative for fever and chills.  Genitourinary: Positive for dysuria, urgency, frequency and flank pain. Negative for hematuria.       Objective:  BP 120/70  Pulse 83  Temp(Src) 98.2 F (36.8 C) (Oral)  Resp 12  Wt 215 lb (97.523 kg)  SpO2 96%   Physical Exam  Constitutional: She is oriented to person, place, and time. No distress.  Cardiovascular: Normal rate and regular rhythm.   Pulmonary/Chest: Effort normal. No respiratory distress.  Genitourinary:  Suprapubic tenderness  Musculoskeletal: Normal range of motion.  Neurological: She is alert and oriented to person, place, and time.  Skin: Skin is warm and dry.  Psychiatric: She has a normal mood and affect. Her behavior is normal. Judgment and thought content normal.          Assessment & Plan:   1. UTI (urinary tract infection) UA showing nitrites. Send for culture. Start Cipro. RTC if no improvement within 4-5 days or sooner if necessary.  - Urine culture - POCT urinalysis dipstick

## 2014-01-16 NOTE — Patient Instructions (Signed)
  Start Cipro twice a day for 5 days. We will contact you with the results of your urine culture once it is available.  Urinary Tract Infection A urinary tract infection (UTI) can occur any place along the urinary tract. The tract includes the kidneys, ureters, bladder, and urethra. A type of germ called bacteria often causes a UTI. UTIs are often helped with antibiotic medicine.  HOME CARE   If given, take antibiotics as told by your doctor. Finish them even if you start to feel better.  Drink enough fluids to keep your pee (urine) clear or pale yellow.  Avoid tea, drinks with caffeine, and bubbly (carbonated) drinks.  Pee often. Avoid holding your pee in for a long time.  Pee before and after having sex (intercourse).  Wipe from front to back after you poop (bowel movement) if you are a woman. Use each tissue only once. GET HELP RIGHT AWAY IF:   You have back pain.  You have lower belly (abdominal) pain.  You have chills.  You feel sick to your stomach (nauseous).  You throw up (vomit).  Your burning or discomfort with peeing does not go away.  You have a fever.  Your symptoms are not better in 3 days. MAKE SURE YOU:   Understand these instructions.  Will watch your condition.  Will get help right away if you are not doing well or get worse. Document Released: 05/15/2008 Document Revised: 08/21/2012 Document Reviewed: 06/27/2012 [facility] Patient Information 2014 [place], [place].

## 2014-01-19 ENCOUNTER — Other Ambulatory Visit: Payer: Self-pay | Admitting: Adult Health

## 2014-01-19 ENCOUNTER — Encounter: Payer: Self-pay | Admitting: Adult Health

## 2014-01-19 LAB — URINE CULTURE: Colony Count: 30000

## 2014-01-19 MED ORDER — NITROFURANTOIN MONOHYD MACRO 100 MG PO CAPS: 100.0000 mg | ORAL_CAPSULE | Freq: Two times a day (BID) | ORAL | Status: DC

## 2014-01-23 ENCOUNTER — Telehealth: Payer: Self-pay | Admitting: Internal Medicine

## 2014-01-23 MED ORDER — FLUCONAZOLE 150 MG PO TABS: 150.0000 mg | ORAL_TABLET | Freq: Every day | ORAL | Status: DC

## 2014-01-23 NOTE — Telephone Encounter (Signed)
Call [facility] pharmacy possible drug interaction with the patient's Fluconazole .

## 2014-01-23 NOTE — Telephone Encounter (Signed)
Between fluconazole and atorvastatin there is an inter reaction pharmacy stated please advise should they fill.

## 2014-01-23 NOTE — Telephone Encounter (Signed)
Replied to pt myChart message

## 2014-01-23 NOTE — Telephone Encounter (Signed)
Pt requesting refill of Fluconazole, ok refill?

## 2014-01-23 NOTE — Telephone Encounter (Signed)
Ok to refill,  Refill sent  

## 2014-01-23 NOTE — Telephone Encounter (Signed)
Refill on fluconazole sent,  But Only once bc of ithe interaction with lipitor .  she should use the nystatin ointment or powder for rash  twice daily until rash is resolved.  Does she have any ?

## 2014-01-23 NOTE — Telephone Encounter (Signed)
notified [facility] to fill

## 2014-01-23 NOTE — Telephone Encounter (Signed)
Yes,. I am aware . please fill

## 2014-01-23 NOTE — Addendum Note (Signed)
Addended by: [name] on: 01/23/2014 12:17 PM   Modules accepted: Orders

## 2014-01-26 NOTE — Telephone Encounter (Signed)
Mailed unread message to pt  

## 2014-03-01 ENCOUNTER — Other Ambulatory Visit: Payer: Self-pay | Admitting: Internal Medicine

## 2014-03-02 NOTE — Telephone Encounter (Signed)
Please advise ok to fill? 2/15 last OV

## 2014-03-08 ENCOUNTER — Other Ambulatory Visit: Payer: Self-pay | Admitting: Adult Health

## 2014-03-08 ENCOUNTER — Other Ambulatory Visit: Payer: Self-pay | Admitting: Internal Medicine

## 2014-03-09 NOTE — Telephone Encounter (Signed)
Refill

## 2014-03-10 NOTE — Telephone Encounter (Signed)
Ok to refill,  Authorized in epic 

## 2014-04-02 ENCOUNTER — Ambulatory Visit (INDEPENDENT_AMBULATORY_CARE_PROVIDER_SITE_OTHER): Payer: BC Managed Care – PPO | Admitting: Adult Health

## 2014-04-02 ENCOUNTER — Encounter: Payer: Self-pay | Admitting: Adult Health

## 2014-04-02 VITALS — BP 142/86 | HR 105 | Temp 97.8°F | Resp 12 | Wt 222.0 lb

## 2014-04-02 DIAGNOSIS — R300 Dysuria: Secondary | ICD-10-CM

## 2014-04-02 LAB — POCT URINALYSIS DIPSTICK
BILIRUBIN UA: NEGATIVE
Glucose, UA: NEGATIVE
KETONES UA: NEGATIVE
Nitrite, UA: NEGATIVE
Protein, UA: NEGATIVE
Spec Grav, UA: 1.025
Urobilinogen, UA: 0.2
pH, UA: 5.5

## 2014-04-02 MED ORDER — CIPROFLOXACIN HCL 500 MG PO TABS: 500.0000 mg | ORAL_TABLET | Freq: Two times a day (BID) | ORAL | Status: DC

## 2014-04-02 MED ORDER — PHENAZOPYRIDINE HCL 200 MG PO TABS: 200.0000 mg | ORAL_TABLET | Freq: Three times a day (TID) | ORAL | Status: DC | PRN

## 2014-04-02 MED ORDER — FLUCONAZOLE 150 MG PO TABS: 150.0000 mg | ORAL_TABLET | Freq: Every day | ORAL | Status: DC

## 2014-04-02 NOTE — Patient Instructions (Signed)
  Start Cipro 500 mg twice a day for 7 days. Cipro may make the effects of glipizide stronger and therefore potentially lower your blood sugar. Make sure to monitor your blood sugar and eat accordingly.  Pyridium 3 times a day as needed for urinary discomfort.  If symptoms persist we can refer you to urology.  I am sending the urine for culture and will contact you once the results are available.

## 2014-04-02 NOTE — Progress Notes (Signed)
Subjective:    Patient ID: Maria Bentley, female    DOB: 1971-06-30, 43 y.o.   MRN: [id number]  HPI Pt is a pleasant 43 year old female who presents to clinic with symptoms of urinary tract infection including dysuria, frequency. She denies fever or chills. This is her third urinary tract infection this year. She reports being under considerable amount of stress with work and also helping care for her mother. She is trying to work on her stress which she believes is affecting her immune system. She's not sleeping well. She does have trazodone available for sleep and reports that this helps except it causes sedation the next day so she does not take it everyday.  Current Outpatient Prescriptions on File Prior to Visit  Medication Sig Dispense Refill  . acetaminophen (TYLENOL) 325 MG tablet Take 650 mg by mouth every 6 (six) hours as needed.      Marland Kitchen atorvastatin (LIPITOR) 20 MG tablet Take 1 tablet (20 mg total) by mouth daily.  90 tablet  3  . buPROPion (WELLBUTRIN XL) 300 MG 24 hr tablet Take 300 mg by mouth daily.      . Cholecalciferol (VITAMIN D3) 2000 UNITS TABS Take by mouth.      . EPINEPHrine (EPIPEN 2-PAK) 0.3 mg/0.3 mL SOAJ injection Inject 0.3 mLs (0.3 mg total) into the muscle once. For severe allergic reaction  1 Device  5  . fluticasone (FLONASE) 50 MCG/ACT nasal spray USE TWO SPRAYS IN EACH NOSTRIL DAILY   16 g  4  . furosemide (LASIX) 20 MG tablet Take 1 tablet (20 mg total) by mouth daily as needed.  90 tablet  3  . glipiZIDE (GLUCOTROL) 5 MG tablet Take one tablet by mouth twice daily before meals  180 tablet  1  . HYDROcodone-acetaminophen (NORCO/VICODIN) 5-325 MG per tablet Take 1 tablet by mouth every 6 (six) hours as needed for pain.  30 tablet  3  . insulin NPH (NOVOLIN N) 100 UNIT/ML injection Inject 20 Units into the skin at bedtime. Increase every 3 days as needed  1 vial  12  . Insulin Syringe-Needle U-100 (B-D INS SYR ULTRAFINE 1CC/31G) 31G X 5/16" 1 ML MISC 1  Syringe by Does not apply route daily.  100 each  0  . KOMBIGLYZE XR 2.04-999 MG TB24 TAKE ONE TABLET BY MOUTH ONCE DAILY MAY INCREAST TO TWO TABLETS BY MOUTH ONCE DAILY AFTER 1 TO 2 WEEKS IF NEEDED  60 tablet  5  . lamoTRIgine (LAMICTAL) 150 MG tablet Take 150 mg by mouth daily.      [name] Device MISC Test blood sugar two times daily  100 each  6  . meclizine (ANTIVERT) 25 MG tablet Take 1 tablet (25 mg total) by mouth 3 (three) times daily as needed.  90 tablet  6  . methocarbamol (ROBAXIN) 500 MG tablet TAKE ONE TABLET BY MOUTH FOUR TIMES DAILY   90 tablet  3  . metoprolol succinate (TOPROL-XL) 50 MG 24 hr tablet TAKE TWO TABLETS BY MOUTH DAILY   60 tablet  0  . nitrofurantoin, macrocrystal-monohydrate, (MACROBID) 100 MG capsule Take 1 capsule (100 mg total) by mouth 2 (two) times daily.  10 capsule  0  . nystatin (MYCOSTATIN) powder Apply topically 2 (two) times daily.  15 g  3  . OnabotulinumtoxinA (BOTOX IJ) Inject as directed every 3 (three) months. For migraine      . ONETOUCH VERIO test strip Test twice a day  50 each  4  . PHENADOZ 25 MG suppository Place 1 suppository rectally every 8 hours as needed for nausea or vomiting  12 suppository  0  . potassium chloride (MICRO-K) 10 MEQ CR capsule TAKE TWO CAPSULES BY MOUTH DAILY  90 capsule  2  . pregabalin (LYRICA) 50 MG capsule Take 50 mg by mouth as needed.      . Prenatal Vit-Fe Fumarate-FA (PRENATAL MULTIVITAMIN) TABS Take 1 tablet by mouth daily.      . SUMAtriptan (IMITREX) 100 MG tablet Take one tablet by mouth daily as needed  9 tablet  9  . traMADol (ULTRAM) 50 MG tablet TAKE ONE TABLET BY MOUTH EVERY 6 HOURS AS NEEDED FOR RELIEF FROM PAIN  90 tablet  1  . traZODone (DESYREL) 50 MG tablet Take 50 mg by mouth at bedtime as needed.       . VENTOLIN HFA 108 (90 BASE) MCG/ACT inhaler Inhale one puff by mouth every six hours as needed  for wheezing  18 g  0   No current facility-administered medications on file prior to visit.       Review of Systems  Constitutional: Negative for fever and chills.  Genitourinary: Positive for dysuria, urgency and frequency. Negative for hematuria and flank pain.  All other systems reviewed and are negative.      Objective:   Physical Exam  Constitutional: She is oriented to person, place, and time. No distress.  Cardiovascular: Normal rate and regular rhythm.   Pulmonary/Chest: Effort normal. No respiratory distress.  Musculoskeletal: Normal range of motion.  Neurological: She is alert and oriented to person, place, and time.  Skin: Skin is warm and dry.  Psychiatric: She has a normal mood and affect. Her behavior is normal. Judgment and thought content normal.      Assessment & Plan:   1. Dysuria UA consistent with UTI. Send urine for culture. Start Cipro 500 mg bid x 7 days. Diflucan for yeast caused by antibiotic (prn). If persistent symptoms consider referral to [facility]. She is agreeable to this. - POCT Urinalysis Dipstick - Urine culture

## 2014-04-02 NOTE — Progress Notes (Signed)
Pre visit review using our clinic review tool, if applicable. No additional management support is needed unless otherwise documented below in the visit note. 

## 2014-04-05 LAB — URINE CULTURE: Colony Count: 80000

## 2014-04-06 ENCOUNTER — Telehealth: Payer: Self-pay | Admitting: Adult Health

## 2014-04-06 MED ORDER — NITROFURANTOIN MONOHYD MACRO 100 MG PO CAPS: 100.0000 mg | ORAL_CAPSULE | Freq: Two times a day (BID) | ORAL | Status: DC

## 2014-04-06 NOTE — Telephone Encounter (Signed)
Urine culture - resistance to cipro Send in prescription for macrobid

## 2014-04-21 ENCOUNTER — Other Ambulatory Visit: Payer: Self-pay | Admitting: Adult Health

## 2014-04-21 ENCOUNTER — Other Ambulatory Visit: Payer: Self-pay | Admitting: Internal Medicine

## 2014-04-21 ENCOUNTER — Telehealth: Payer: Self-pay | Admitting: Internal Medicine

## 2014-04-21 MED ORDER — NITROFURANTOIN MONOHYD MACRO 100 MG PO CAPS: 100.0000 mg | ORAL_CAPSULE | Freq: Two times a day (BID) | ORAL | Status: DC

## 2014-04-21 NOTE — Telephone Encounter (Signed)
Spoke to patient and notified her of Raquel's comments. Patient verbalized understanding. Stated she will call to make an appointment if things don't get better.

## 2014-04-21 NOTE — Telephone Encounter (Signed)
Pt states she was seen recently by R. Rey for UTI.  States her symptoms never fully cleared and she is still having problems.  Asking if antibiotic can be continued.

## 2014-04-21 NOTE — Telephone Encounter (Signed)
I refilled the antibiotic and sent to her pharmacy. Macrobid bid x 5 days. If no improvement will need to be re-evaluated.

## 2014-04-21 NOTE — Telephone Encounter (Signed)
Patient seen by you on 04/02/14 for UT. Symptoms never fully cleared up. Can antibiotic be refilled or is office visit needed?

## 2014-04-21 NOTE — Telephone Encounter (Signed)
Ok refill? 

## 2014-04-23 ENCOUNTER — Telehealth: Payer: Self-pay | Admitting: *Deleted

## 2014-04-23 NOTE — Telephone Encounter (Signed)
Refill request  Phenadoz Rectal Suppository 25mg   Take one tablet by mouth every 8 hours as needed for nausea and vomiting

## 2014-04-23 NOTE — Telephone Encounter (Signed)
This was refilled yesterday, duplicate refill

## 2014-04-24 ENCOUNTER — Other Ambulatory Visit: Payer: Self-pay | Admitting: Internal Medicine

## 2014-05-06 ENCOUNTER — Other Ambulatory Visit: Payer: Self-pay | Admitting: Internal Medicine

## 2014-05-06 NOTE — Telephone Encounter (Signed)
Sent mychart message on need for follow up appointment with Dr. [name]

## 2014-05-08 ENCOUNTER — Telehealth: Payer: Self-pay | Admitting: Internal Medicine

## 2014-05-08 ENCOUNTER — Telehealth: Payer: Self-pay | Admitting: *Deleted

## 2014-05-08 MED ORDER — INSULIN NPH (HUMAN) (ISOPHANE) 100 UNIT/ML ~~LOC~~ SUSP: 50.0000 [IU] | Freq: Every day | SUBCUTANEOUS | Status: DC

## 2014-05-08 NOTE — Telephone Encounter (Signed)
Patient has an appointment 6/17 but pharmacy will not fill until we give a max dose stated insurance will not pay unless max dose on sig.

## 2014-05-08 NOTE — Telephone Encounter (Signed)
Needs to know what that maximum dispensing dose need to be on her Novolog inj? Direction are 20units QHS, may increased every 3 days as needed. Please advise.

## 2014-05-08 NOTE — Telephone Encounter (Signed)
Patient stated she has been taking 50 units for the last 2 months just need max for pharmacy.

## 2014-05-08 NOTE — Telephone Encounter (Signed)
Novolin refill sent

## 2014-05-08 NOTE — Telephone Encounter (Signed)
Find out how much sde is currently using so I can put the maximum  On

## 2014-05-08 NOTE — Telephone Encounter (Signed)
See below my chart message for med refills     ===View-only below this line===   ----- Message -----    From: Lavallee,Keshonna RUFTY    Sent: 05/08/2014  7:27 AM EDT      To: Patient Appointment Schedule Request Mailing List Subject: Appointment Request  Appointment Request From: Dorinda Hill  With Provider: Deborra Medina, MD [-Primary Care Physician-]  Preferred Date Range: From 05/13/2014 To 05/25/2014  Preferred Times: Monday Afternoon, Tuesday Afternoon, Wednesday Afternoon, Thursday Afternoon, Friday Afternoon  Reason for visit: Office Visit  Comments: Can you please send in a refill on the insulin for enough to last until the appointment?  It really does help my blood glucose levels.

## 2014-05-17 ENCOUNTER — Other Ambulatory Visit: Payer: Self-pay | Admitting: Internal Medicine

## 2014-05-18 NOTE — Telephone Encounter (Signed)
Refill

## 2014-05-27 ENCOUNTER — Encounter: Payer: Self-pay | Admitting: Internal Medicine

## 2014-05-27 ENCOUNTER — Ambulatory Visit (INDEPENDENT_AMBULATORY_CARE_PROVIDER_SITE_OTHER): Payer: BC Managed Care – PPO | Admitting: Internal Medicine

## 2014-05-27 VITALS — BP 134/92 | HR 92 | Temp 98.2°F | Resp 18 | Ht 64.0 in | Wt 222.0 lb

## 2014-05-27 DIAGNOSIS — R5381 Other malaise: Secondary | ICD-10-CM

## 2014-05-27 DIAGNOSIS — R000 Tachycardia, unspecified: Secondary | ICD-10-CM

## 2014-05-27 DIAGNOSIS — H6690 Otitis media, unspecified, unspecified ear: Secondary | ICD-10-CM

## 2014-05-27 DIAGNOSIS — H60399 Other infective otitis externa, unspecified ear: Secondary | ICD-10-CM

## 2014-05-27 DIAGNOSIS — H65199 Other acute nonsuppurative otitis media, unspecified ear: Secondary | ICD-10-CM

## 2014-05-27 DIAGNOSIS — E119 Type 2 diabetes mellitus without complications: Secondary | ICD-10-CM

## 2014-05-27 DIAGNOSIS — G4730 Sleep apnea, unspecified: Secondary | ICD-10-CM

## 2014-05-27 DIAGNOSIS — E669 Obesity, unspecified: Secondary | ICD-10-CM

## 2014-05-27 DIAGNOSIS — E039 Hypothyroidism, unspecified: Secondary | ICD-10-CM

## 2014-05-27 DIAGNOSIS — M25559 Pain in unspecified hip: Secondary | ICD-10-CM

## 2014-05-27 DIAGNOSIS — R5383 Other fatigue: Secondary | ICD-10-CM

## 2014-05-27 DIAGNOSIS — H6091 Unspecified otitis externa, right ear: Secondary | ICD-10-CM

## 2014-05-27 DIAGNOSIS — IMO0002 Reserved for concepts with insufficient information to code with codable children: Secondary | ICD-10-CM

## 2014-05-27 DIAGNOSIS — M25551 Pain in right hip: Secondary | ICD-10-CM

## 2014-05-27 DIAGNOSIS — IMO0001 Reserved for inherently not codable concepts without codable children: Secondary | ICD-10-CM

## 2014-05-27 DIAGNOSIS — E559 Vitamin D deficiency, unspecified: Secondary | ICD-10-CM

## 2014-05-27 DIAGNOSIS — E785 Hyperlipidemia, unspecified: Secondary | ICD-10-CM

## 2014-05-27 DIAGNOSIS — Z9884 Bariatric surgery status: Secondary | ICD-10-CM

## 2014-05-27 DIAGNOSIS — G43019 Migraine without aura, intractable, without status migrainosus: Secondary | ICD-10-CM

## 2014-05-27 DIAGNOSIS — E1165 Type 2 diabetes mellitus with hyperglycemia: Secondary | ICD-10-CM

## 2014-05-27 DIAGNOSIS — E1169 Type 2 diabetes mellitus with other specified complication: Secondary | ICD-10-CM

## 2014-05-27 DIAGNOSIS — H65192 Other acute nonsuppurative otitis media, left ear: Secondary | ICD-10-CM

## 2014-05-27 LAB — LIPID PANEL
Cholesterol: 165 mg/dL (ref 0–200)
HDL: 46.2 mg/dL (ref >39.00)
LDL CALC: 87 mg/dL (ref 0–99)
NONHDL: 118.8
Total CHOL/HDL Ratio: 4
Triglycerides: 161 mg/dL — ABNORMAL HIGH (ref 0.0–149.0)
VLDL: 32.2 mg/dL (ref 0.0–40.0)

## 2014-05-27 LAB — COMPREHENSIVE METABOLIC PANEL
ALBUMIN: 4 g/dL (ref 3.5–5.2)
ALT: 33 U/L (ref 0–35)
AST: 28 U/L (ref 0–37)
Alkaline Phosphatase: 89 U/L (ref 39–117)
BILIRUBIN TOTAL: 0.2 mg/dL (ref 0.2–1.2)
BUN: 17 mg/dL (ref 6–23)
CO2: 30 mEq/L (ref 19–32)
Calcium: 10.1 mg/dL (ref 8.4–10.5)
Chloride: 100 mEq/L (ref 96–112)
Creatinine, Ser: 1.1 mg/dL (ref 0.4–1.2)
GFR: 55.63 mL/min — ABNORMAL LOW (ref >60.00)
Glucose, Bld: 93 mg/dL (ref 70–99)
POTASSIUM: 4.5 meq/L (ref 3.5–5.1)
Sodium: 139 mEq/L (ref 135–145)
TOTAL PROTEIN: 7.3 g/dL (ref 6.0–8.3)

## 2014-05-27 LAB — CBC WITH DIFFERENTIAL/PLATELET
BASOS ABS: 0.1 10*3/uL (ref 0.0–0.1)
Basophils Relative: 0.4 % (ref 0.0–3.0)
EOS ABS: 0.1 10*3/uL (ref 0.0–0.7)
Eosinophils Relative: 0.6 % (ref 0.0–5.0)
HCT: 46.3 % — ABNORMAL HIGH (ref 36.0–46.0)
HEMOGLOBIN: 15.3 g/dL — AB (ref 12.0–15.0)
LYMPHS ABS: 3.3 10*3/uL (ref 0.7–4.0)
Lymphocytes Relative: 24.3 % (ref 12.0–46.0)
MCHC: 33.1 g/dL (ref 30.0–36.0)
MCV: 93.3 fl (ref 78.0–100.0)
MONO ABS: 0.7 10*3/uL (ref 0.1–1.0)
Monocytes Relative: 5.2 % (ref 3.0–12.0)
NEUTROS ABS: 9.4 10*3/uL — AB (ref 1.4–7.7)
Neutrophils Relative %: 69.5 % (ref 43.0–77.0)
Platelets: 440 10*3/uL — ABNORMAL HIGH (ref 150.0–400.0)
RBC: 4.97 Mil/uL (ref 3.87–5.11)
RDW: 13.5 % (ref 11.5–15.5)
WBC: 13.5 10*3/uL — ABNORMAL HIGH (ref 4.0–10.5)

## 2014-05-27 LAB — HM DIABETES FOOT EXAM: HM Diabetic Foot Exam: NORMAL

## 2014-05-27 LAB — MICROALBUMIN / CREATININE URINE RATIO
Creatinine,U: 181.2 mg/dL
Microalb Creat Ratio: 2 mg/g (ref 0.0–30.0)
Microalb, Ur: 3.6 mg/dL — ABNORMAL HIGH (ref 0.0–1.9)

## 2014-05-27 LAB — TSH: TSH: 1.91 u[IU]/mL (ref 0.35–4.50)

## 2014-05-27 LAB — HEMOGLOBIN A1C: HEMOGLOBIN A1C: 8.5 % — AB (ref 4.6–6.5)

## 2014-05-27 LAB — VITAMIN D 25 HYDROXY (VIT D DEFICIENCY, FRACTURES): VITD: 39.4 ng/mL

## 2014-05-27 MED ORDER — PROMETHAZINE HCL 25 MG PO TABS: 25.0000 mg | ORAL_TABLET | Freq: Three times a day (TID) | ORAL | Status: DC | PRN

## 2014-05-27 MED ORDER — NABUMETONE 750 MG PO TABS: 750.0000 mg | ORAL_TABLET | Freq: Every day | ORAL | Status: DC

## 2014-05-27 MED ORDER — CIPROFLOXACIN-DEXAMETHASONE 0.3-0.1 % OT SUSP: 4.0000 [drp] | Freq: Two times a day (BID) | OTIC | Status: DC

## 2014-05-27 NOTE — Assessment & Plan Note (Signed)
Excessive caffeine, deconditioning and emotional stressors cited as causes after cardiology eval rule out structural disease

## 2014-05-27 NOTE — Progress Notes (Signed)
Patient ID: [name], female   DOB: 1971-09-06, 43 y.o.   MRN: [id number]   Patient Active Problem List   Diagnosis Date Noted  . Otitis media 05/30/2014  . Otitis externa of right ear 05/30/2014  . Right hip pain 05/30/2014  . UTI (urinary tract infection) 01/16/2014  . Dysuria 12/16/2013  . Tachycardia 08/15/2013  . Urinary urgency 08/15/2013  . Sleep disturbance 08/15/2013  . Diabetes mellitus type 2, uncontrolled 08/15/2013  . Palpitations 07/15/2013  . Dyspnea 07/15/2013  . Candidiasis, intertriginous 06/27/2013  . Obesity (BMI 30-39.9) 06/09/2012  . Anxiety   . Headache, common migraine, intractable   . Hyperlipidemia   . Obstructive sleep apnea on CPAP 02/05/2012    Subjective:  CC:   Chief Complaint  Patient presents with  . Follow-up  . Diabetes  . Otalgia    right has pain , left feels full.    HPI:   Maria Bentley is a 43 y.o. female who presents for Follow up on multiple acute and chronic issues including poorly controlled DM, chronic headache disorder, recurrent dysuria and intertrigonal and oral  candiasis, generalized anxiety, OSA,  And recurrent obesity s/p gastric bypass surgery   DM:  Last seen nearly a year ago for DM follow up. Blood sugars  Have been labile.  As high as 230's  Postprandially,  Using NPH insulin 50 units at bedtime   Headaches reduce to weekly with botox  X 3,   Some are incapacitating and last longer but respond to imitrex and vicodin better. Has not had a sleep study in many years.  Was borderline prior to gastric bypass .  Neurology is considering adding topomax  Obesity: has gained 27 lbs since 2013.  She is not exercising or following a bariatric/low glycemic index diet.  She is planning on joining the [facility] to start an exercise program.  Acknowledges that her entire  family is overweight needs to start a weight loss program.  Her mother is living with her and the added responsibiity has been distracting and  stressful,.    Has been having continual hip pain  On and off , aggravated by sofa sleeping .  Needs  Hip x ray  bilateral ear issues.,  Right one started itching,  Now the ear canal is hurting.   Left eardrum has been hurting for months.,  Had an episode of severe otitis 2 yrs ago .  Exam suggests ruptured ear drum on the left .  ENt referral . s   Past Medical History  Diagnosis Date  . Diabetes mellitus   . Morbid obesity     s/p gastric bypass  . Anxiety   . Headache, common migraine, intractable   . Hyperlipidemia   . Thyroid disease   . Depression 2010  . Anemia   . Rosacea   . Kidney stones     left  . Personal hx of gastric bypass 2010  . Vertigo     Past Surgical History  Procedure Laterality Date  . Gastric bypass    . Dilation and curettage of uterus  2011  . Ovarian cyst removal    . Rotator cuff repair      right       The following portions of the patient's history were reviewed and updated as appropriate: Allergies, current medications, and problem list.    Review of Systems:   Patient denies headache, fevers, malaise, unintentional weight loss, skin rash, eye pain, sinus congestion and sinus  pain, sore throat, dysphagia,  hemoptysis , cough, dyspnea, wheezing, chest pain, palpitations, orthopnea, edema, abdominal pain, nausea, melena, diarrhea, constipation, flank pain, dysuria, hematuria, urinary  Frequency, nocturia, numbness, tingling, seizures,  Focal weakness, Loss of consciousness,  Tremor, insomnia, depression, anxiety, and suicidal ideation.     History   Social History  . Marital Status: Married    Spouse Name: N/A    Number of Children: N/A  . Years of Education: N/A   Occupational History  . Not on file.   Social History Main Topics  . Smoking status: Former Smoker -- 1.00 packs/day for 4 years    Types: Cigarettes    Quit date: 02/05/1996  . Smokeless tobacco: Never Used  . Alcohol Use: Yes     Comment: Once a year.  . Drug  Use: No  . Sexual Activity: Not on file   Other Topics Concern  . Not on file   Social History Narrative  . No narrative on file    Objective:  Filed Vitals:   05/27/14 1347  BP: 134/92  Pulse: 92  Temp: 98.2 F (36.8 C)  Resp: 18     General appearance: alert, cooperative and appears stated age Ears: retracted left TM, questional perforation at inferior border,  Right TM normal  Right ear canal erythematous without discharge  Throat: lips, mucosa, and tongue normal; teeth and gums normal Neck: no adenopathy, no carotid bruit, supple, symmetrical, trachea midline and thyroid not enlarged, symmetric, no tenderness/mass/nodules Back: symmetric, no curvature. ROM normal. No CVA tenderness. Lungs: clear to auscultation bilaterally Heart: regular rate and rhythm, S1, S2 normal, no murmur, click, rub or gallop Abdomen: soft, non-tender; bowel sounds normal; no masses,  no organomegaly Pulses: 2+ and symmetric Skin: Skin color, texture, turgor normal. No rashes or lesions Lymph nodes: Cervical, supraclavicular, and axillary nodes normal. Foot exam:  Nails are well trimmed,  No callouses,  Sensation intact to microfilament  Assessment and Plan:  Diabetes mellitus type 2, uncontrolled Secondary to history of  noncompliance with both medication and diet. She has had improvement since October 2014, at which time her a1c was over 10.  Offered diabetic nutrition education through [place] but she declined referral at this time.  Current meds are NPH 50 untis QHs,  Glipizide 5 mg bid and kombiglyze 2.04/999 .  Asked her to submit log of BS in two weeks so medications can be adjusted    Tachycardia Excessive caffeine, deconditioning and emotional stressors cited as causes after cardiology eval rule out structural disease  Headache, common migraine, intractable She has been receiving Botox injections from [facility] with improvement in frequency of migraines.    Hyperlipidemia LDL and triglycerides are at goal on current medications. she has no side effects and liver enzymes are normal. No changes today  Lab Results  Component Value Date   CHOL 165 05/27/2014   HDL 46.20 05/27/2014   LDLCALC 87 05/27/2014   LDLDIRECT 156.5 06/25/2013   TRIG 161.0* 05/27/2014   CHOLHDL 4 05/27/2014   Lab Results  Component Value Date   ALT 33 05/27/2014   AST 28 05/27/2014   ALKPHOS 89 05/27/2014   BILITOT 0.2 05/27/2014      Obesity (BMI 30-39.9) She has gained a significant amount of weight post gastric bypass due to noncompliance with lifestyle changes and diet. .  I have addressed  BMI and recommended wt loss of 10% of body weigh over the next 6 monthsresuming a low glycemic index diet and  regular exercise a minimum of 5 days per week.    Unspecified hypothyroidism Thyroid function is WNL on current dose.  No current changes needed.   Obstructive sleep apnea on CPAP Diagnosed by sleep study. She is wearing her CPAP every night a minimum of 6 hours per night.   Otitis media She has an abnormal TM on the left, appears to be retracted with perforation on the inferior margin.  Given her persistent symptoms, will refer to ENT for evaluation   Otitis externa of right ear Topical antibiotic/steroid cream prescribed   Right hip pain She is at increased risk for hip fracture due to history of gastric bypass and vitamin d deficiency.  Plain films ordered.   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  Current Vit D level is adequate  A total of 30 minutes was spent with patient more than half of which was spent in counseling, reviewing records from other prviders and coordination of care.  Updated Medication List Outpatient Encounter Prescriptions as of 05/27/2014  Medication Sig  . acetaminophen (TYLENOL) 325 MG tablet Take 650 mg by mouth every 6 (six) hours as needed.  Marland Kitchen  atorvastatin (LIPITOR) 20 MG tablet Take 1 tablet (20 mg total) by mouth daily.  Marland Kitchen buPROPion (WELLBUTRIN XL) 300 MG 24 hr tablet Take 300 mg by mouth daily.  . Cholecalciferol (VITAMIN D3) 2000 UNITS TABS Take by mouth.  . EPINEPHrine (EPIPEN 2-PAK) 0.3 mg/0.3 mL SOAJ injection Inject 0.3 mLs (0.3 mg total) into the muscle once. For severe allergic reaction  . fluconazole (DIFLUCAN) 150 MG tablet Take 1 tablet (150 mg total) by mouth daily.  . fluticasone (FLONASE) 50 MCG/ACT nasal spray USE TWO SPRAYS IN EACH NOSTRIL DAILY   . furosemide (LASIX) 20 MG tablet Take 1 tablet (20 mg total) by mouth daily as needed.  Marland Kitchen glipiZIDE (GLUCOTROL) 5 MG tablet Take one tablet by mouth twice daily before meals  . HYDROcodone-acetaminophen (NORCO) 10-325 MG per tablet Take 1 tablet by mouth every 6 (six) hours as needed.  . insulin NPH Human (NOVOLIN N) 100 UNIT/ML injection Inject 0.5 mLs (50 Units total) into the skin at bedtime.  . Insulin Syringe-Needle U-100 (B-D INS SYR ULTRAFINE 1CC/31G) 31G X 5/16" 1 ML MISC 1 Syringe by Does not apply route daily.  Marland Kitchen KOMBIGLYZE XR 2.04-999 MG TB24 take one tablet by mouth once daily may increase to two tablets by mouth once daily after 1 to 2 weeks if needed   . lamoTRIgine (LAMICTAL) 150 MG tablet Take 150 mg by mouth daily.  [name] Device MISC Test blood sugar two times daily  . meclizine (ANTIVERT) 25 MG tablet Take 1 tablet (25 mg total) by mouth 3 (three) times daily as needed.  . methocarbamol (ROBAXIN) 500 MG tablet TAKE ONE TABLET BY MOUTH FOUR TIMES DAILY   . metoprolol succinate (TOPROL-XL) 50 MG 24 hr tablet TAKE TWO TABLETS BY MOUTH DAILY   . nystatin (MYCOSTATIN) powder Apply topically 2 (two) times daily.  . OnabotulinumtoxinA (BOTOX IJ) Inject as directed every 3 (three) months. For migraine  . ONETOUCH VERIO test strip Test twice a day  . potassium chloride (MICRO-K) 10 MEQ CR capsule TAKE TWO CAPSULES BY MOUTH DAILY  . pregabalin (LYRICA) 50 MG  capsule Take 50 mg by mouth as needed.  . Prenatal Vit-Fe Fumarate-FA (PRENATAL MULTIVITAMIN) TABS Take 1 tablet by mouth daily.  . SUMAtriptan (IMITREX) 100 MG tablet Take  one tablet by mouth daily as needed  . traMADol (ULTRAM) 50 MG tablet TAKE ONE TABLET BY MOUTH EVERY 6 HOURS AS NEEDED FOR RELIEF FROM PAIN  . traZODone (DESYREL) 50 MG tablet Take 50 mg by mouth at bedtime as needed.   . VENTOLIN HFA 108 (90 BASE) MCG/ACT inhaler Inhale one puff by mouth every six hours as needed  for wheezing  . [DISCONTINUED] PHENADOZ 25 MG suppository  UNWRAP AND INSERT ONE SUPPOSITORY RECTALLY EVERY EIGHT HOURS AS NEEDED FOR NAUSEA AND VOMITING   . ciprofloxacin-dexamethasone (CIPRODEX) otic suspension Place 4 drops into the right ear 2 (two) times daily.  . nabumetone (RELAFEN) 750 MG tablet Take 1 tablet (750 mg total) by mouth daily.  . promethazine (PHENERGAN) 25 MG tablet Take 1 tablet (25 mg total) by mouth every 8 (eight) hours as needed for nausea or vomiting.  . [DISCONTINUED] HYDROcodone-acetaminophen (NORCO/VICODIN) 5-325 MG per tablet Take 1 tablet by mouth every 6 (six) hours as needed for pain.  . [DISCONTINUED] nitrofurantoin, macrocrystal-monohydrate, (MACROBID) 100 MG capsule Take 1 capsule (100 mg total) by mouth 2 (two) times daily.  . [DISCONTINUED] phenazopyridine (PYRIDIUM) 200 MG tablet Take 1 tablet (200 mg total) by mouth 3 (three) times daily as needed for pain.

## 2014-05-27 NOTE — Assessment & Plan Note (Addendum)
Secondary to history of  noncompliance with both medication and diet. She has had improvement since October 2014, at which time her a1c was over 10.  Offered diabetic nutrition education through [place] but she declined referral at this time.  Current meds are NPH 50 untis QHs,  Glipizide 5 mg bid and kombiglyze 2.04/999 .  Asked her to submit log of BS in two weeks so medications can be adjusted

## 2014-05-27 NOTE — Patient Instructions (Signed)
Topical ear drops sent to pharmacy for right ear  ENT referral for left ear  Please submit log of BS in two weeks   Follow up in three month

## 2014-05-27 NOTE — Progress Notes (Signed)
Pre-visit discussion using our clinic review tool. No additional management support is needed unless otherwise documented below in the visit note.  

## 2014-05-30 DIAGNOSIS — H6690 Otitis media, unspecified, unspecified ear: Secondary | ICD-10-CM | POA: Insufficient documentation

## 2014-05-30 DIAGNOSIS — M25551 Pain in right hip: Secondary | ICD-10-CM | POA: Insufficient documentation

## 2014-05-30 DIAGNOSIS — E559 Vitamin D deficiency, unspecified: Secondary | ICD-10-CM | POA: Insufficient documentation

## 2014-05-30 DIAGNOSIS — H6091 Unspecified otitis externa, right ear: Secondary | ICD-10-CM | POA: Insufficient documentation

## 2014-05-30 NOTE — Assessment & Plan Note (Signed)
She has been receiving Botox injections from [facility] with improvement in frequency of migraines.

## 2014-05-30 NOTE — Assessment & Plan Note (Addendum)
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  Current Vit D level is adequate

## 2014-05-30 NOTE — Assessment & Plan Note (Signed)
Diagnosed by sleep study. She is wearing her CPAP every night a minimum of 6 hours per night.  

## 2014-05-30 NOTE — Assessment & Plan Note (Signed)
Thyroid function is WNL on current dose.  No current changes needed.  

## 2014-05-30 NOTE — Assessment & Plan Note (Signed)
LDL and triglycerides are at goal on current medications. she has no side effects and liver enzymes are normal. No changes today  Lab Results  Component Value Date   CHOL 165 05/27/2014   HDL 46.20 05/27/2014   LDLCALC 87 05/27/2014   LDLDIRECT 156.5 06/25/2013   TRIG 161.0* 05/27/2014   CHOLHDL 4 05/27/2014   Lab Results  Component Value Date   ALT 33 05/27/2014   AST 28 05/27/2014   ALKPHOS 89 05/27/2014   BILITOT 0.2 05/27/2014

## 2014-05-30 NOTE — Assessment & Plan Note (Signed)
She has gained a significant amount of weight post gastric bypass due to noncompliance with lifestyle changes and diet. .  I have addressed  BMI and recommended wt loss of 10% of body weigh over the next 6 monthsresuming a low glycemic index diet and regular exercise a minimum of 5 days per week.

## 2014-05-30 NOTE — Assessment & Plan Note (Signed)
Topical antibiotic/steroid cream prescribed

## 2014-05-30 NOTE — Assessment & Plan Note (Signed)
She has an abnormal TM on the left, appears to be retracted with perforation on the inferior margin.  Given her persistent symptoms, will refer to ENT for evaluation

## 2014-05-30 NOTE — Assessment & Plan Note (Signed)
She is at increased risk for hip fracture due to history of gastric bypass and vitamin d deficiency.  Plain films ordered.

## 2014-05-31 ENCOUNTER — Other Ambulatory Visit: Payer: Self-pay | Admitting: Internal Medicine

## 2014-06-03 ENCOUNTER — Other Ambulatory Visit: Payer: Self-pay | Admitting: Internal Medicine

## 2014-06-30 ENCOUNTER — Telehealth: Payer: Self-pay | Admitting: *Deleted

## 2014-06-30 NOTE — Telephone Encounter (Signed)
PA started on Kobiglyze XR, given to Dr. [name] for signature.

## 2014-07-01 ENCOUNTER — Encounter: Payer: Self-pay | Admitting: Internal Medicine

## 2014-07-02 ENCOUNTER — Other Ambulatory Visit: Payer: Self-pay | Admitting: Internal Medicine

## 2014-07-05 MED ORDER — LISINOPRIL 10 MG PO TABS: 10.0000 mg | ORAL_TABLET | Freq: Every day | ORAL | Status: DC

## 2014-07-06 NOTE — Telephone Encounter (Signed)
Received fax from [place], PA not required.

## 2014-07-24 ENCOUNTER — Telehealth: Payer: Self-pay | Admitting: Internal Medicine

## 2014-07-24 NOTE — Telephone Encounter (Signed)
Patient started Lisinopril 10 mg on 07/06/14 and has had episodes of light headed checked BP 105/60 patient stated she has not taken the lisinopril today but has continued to have light headedness. Patient could not give me a heart rate but stated she felt like her heart was beating fast.

## 2014-07-24 NOTE — Telephone Encounter (Signed)
Patient notified and voiced understanding.

## 2014-07-24 NOTE — Telephone Encounter (Signed)
Tell her to stop the medication for a few days and recheck her bp.. If it is > 140/80 , resume it at 5 mg daily.  If she can't cut it in half, let me know

## 2014-07-29 ENCOUNTER — Other Ambulatory Visit: Payer: Self-pay | Admitting: Internal Medicine

## 2014-07-29 NOTE — Telephone Encounter (Signed)
Script faxed.

## 2014-07-29 NOTE — Telephone Encounter (Signed)
Last visit 05/27/14, refill?

## 2014-07-29 NOTE — Telephone Encounter (Signed)
Ok to refill,  printed rx  

## 2014-08-07 ENCOUNTER — Telehealth: Payer: Self-pay | Admitting: Internal Medicine

## 2014-08-07 MED ORDER — LISINOPRIL 5 MG PO TABS: 5.0000 mg | ORAL_TABLET | Freq: Every day | ORAL | Status: DC

## 2014-08-07 NOTE — Telephone Encounter (Signed)
Patient called and stated that Lisinopril was reduced 07/24/14 see phone note patient needs script for 5 mg, I sent a script for the 5 mg to [facility] as patient requested. FYI

## 2014-08-19 ENCOUNTER — Other Ambulatory Visit: Payer: Self-pay | Admitting: Internal Medicine

## 2014-08-28 ENCOUNTER — Ambulatory Visit (INDEPENDENT_AMBULATORY_CARE_PROVIDER_SITE_OTHER): Payer: BC Managed Care – PPO | Admitting: Internal Medicine

## 2014-08-28 VITALS — BP 106/86 | HR 120 | Temp 98.3°F | Resp 16 | Ht 64.0 in | Wt 229.5 lb

## 2014-08-28 DIAGNOSIS — Z9989 Dependence on other enabling machines and devices: Principal | ICD-10-CM

## 2014-08-28 DIAGNOSIS — L03319 Cellulitis of trunk, unspecified: Secondary | ICD-10-CM

## 2014-08-28 DIAGNOSIS — IMO0001 Reserved for inherently not codable concepts without codable children: Secondary | ICD-10-CM

## 2014-08-28 DIAGNOSIS — E669 Obesity, unspecified: Secondary | ICD-10-CM

## 2014-08-28 DIAGNOSIS — L02219 Cutaneous abscess of trunk, unspecified: Secondary | ICD-10-CM

## 2014-08-28 DIAGNOSIS — L03311 Cellulitis of abdominal wall: Secondary | ICD-10-CM

## 2014-08-28 DIAGNOSIS — Z23 Encounter for immunization: Secondary | ICD-10-CM

## 2014-08-28 DIAGNOSIS — IMO0002 Reserved for concepts with insufficient information to code with codable children: Secondary | ICD-10-CM

## 2014-08-28 DIAGNOSIS — G4733 Obstructive sleep apnea (adult) (pediatric): Secondary | ICD-10-CM

## 2014-08-28 DIAGNOSIS — R5381 Other malaise: Secondary | ICD-10-CM

## 2014-08-28 DIAGNOSIS — R5383 Other fatigue: Secondary | ICD-10-CM

## 2014-08-28 DIAGNOSIS — E1165 Type 2 diabetes mellitus with hyperglycemia: Secondary | ICD-10-CM

## 2014-08-28 DIAGNOSIS — E119 Type 2 diabetes mellitus without complications: Secondary | ICD-10-CM

## 2014-08-28 LAB — CBC WITH DIFFERENTIAL/PLATELET
Basophils Absolute: 0 10*3/uL (ref 0.0–0.1)
Basophils Relative: 0 % (ref 0–1)
Eosinophils Absolute: 0.2 10*3/uL (ref 0.0–0.7)
Eosinophils Relative: 1 % (ref 0–5)
HCT: 43.8 % (ref 36.0–46.0)
HEMOGLOBIN: 14.3 g/dL (ref 12.0–15.0)
LYMPHS ABS: 4 10*3/uL (ref 0.7–4.0)
Lymphocytes Relative: 26 % (ref 12–46)
MCH: 30.7 pg (ref 26.0–34.0)
MCHC: 32.6 g/dL (ref 30.0–36.0)
MCV: 94 fL (ref 78.0–100.0)
Monocytes Absolute: 0.8 10*3/uL (ref 0.1–1.0)
Monocytes Relative: 5 % (ref 3–12)
NEUTROS PCT: 68 % (ref 43–77)
Neutro Abs: 10.3 10*3/uL — ABNORMAL HIGH (ref 1.7–7.7)
Platelets: 539 10*3/uL — ABNORMAL HIGH (ref 150–400)
RBC: 4.66 MIL/uL (ref 3.87–5.11)
RDW: 14 % (ref 11.5–15.5)
WBC: 15.2 10*3/uL — AB (ref 4.0–10.5)

## 2014-08-28 LAB — COMPREHENSIVE METABOLIC PANEL
ALT: 17 U/L (ref 0–35)
AST: 17 U/L (ref 0–37)
Albumin: 4 g/dL (ref 3.5–5.2)
Alkaline Phosphatase: 87 U/L (ref 39–117)
BILIRUBIN TOTAL: 0.3 mg/dL (ref 0.2–1.2)
BUN: 16 mg/dL (ref 6–23)
CHLORIDE: 100 meq/L (ref 96–112)
CO2: 26 meq/L (ref 19–32)
CREATININE: 1 mg/dL (ref 0.50–1.10)
Calcium: 9.8 mg/dL (ref 8.4–10.5)
Glucose, Bld: 143 mg/dL — ABNORMAL HIGH (ref 70–99)
Potassium: 4.4 mEq/L (ref 3.5–5.3)
Sodium: 139 mEq/L (ref 135–145)
Total Protein: 7 g/dL (ref 6.0–8.3)

## 2014-08-28 MED ORDER — CEPHALEXIN 500 MG PO CAPS: 500.0000 mg | ORAL_CAPSULE | Freq: Four times a day (QID) | ORAL | Status: DC

## 2014-08-28 MED ORDER — INSULIN NPH (HUMAN) (ISOPHANE) 100 UNIT/ML ~~LOC~~ SUSP: 55.0000 [IU] | Freq: Every day | SUBCUTANEOUS | Status: DC

## 2014-08-28 NOTE — Progress Notes (Signed)
Pre-visit discussion using our clinic review tool. No additional management support is needed unless otherwise documented below in the visit note.  

## 2014-08-28 NOTE — Progress Notes (Signed)
Patient ID: Maria Bentley, female   DOB: [birth year], 43 y.o.   MRN: [id number]   Patient Active Problem List   Diagnosis Date Noted  . Cellulitis 08/30/2014  . Otitis media 05/30/2014  . Otitis externa of right ear 05/30/2014  . Right hip pain 05/30/2014  . Unspecified vitamin D deficiency 05/30/2014  . Tachycardia 08/15/2013  . Diabetes mellitus type 2, uncontrolled 08/15/2013  . Palpitations 07/15/2013  . Dyspnea 07/15/2013  . Obesity (BMI 30-39.9) 06/09/2012  . Anxiety   . Headache, common migraine, intractable   . Hyperlipidemia   . Obstructive sleep apnea on CPAP 02/05/2012    Subjective:  CC:   Chief Complaint  Patient presents with  . Follow-up    HPI:   Maria Bentley is a 42 y.o. female who presents for Follow up on  Multiple Chronic issues,  Including DM Type 2, morbid obesity s/p gastric bypass with recent weight gain,  OSA, and anxiety.  She does not feel well today due to current migraine .  Has had a difficult couple of months.  Her dog died,  And her mother has had serious health problems and had to move in with her to avoid being placed in nursing home.    DM:  Her BS have been low into the 50's in the afternoon frequently due to altered eating schedule and recent increase in medications for uncontrolled DM  .  She is taking 50 units of NPH in the evening , and 5 mg glipizide twice daily.   Has been eating erratically, bc kitchen  has not been functional   Comes home from work exhausted,  not exercising.  Lies on the couch every night and watches tv at night. Think her depression is recurring. Not sleeping well,  Stressed out,  Fibromyalgia acting up.    Has developed a mild cellulitis from a Boil on right pannus  firsgt noted 2 days ago.    Past Medical History  Diagnosis Date  . Diabetes mellitus   . Morbid obesity     s/p gastric bypass  . Anxiety   . Headache, common migraine, intractable   . Hyperlipidemia   . Thyroid disease   .  Depression 2010  . Anemia   . Rosacea   . Kidney stones     left  . Personal hx of gastric bypass 2010  . Vertigo     Past Surgical History  Procedure Laterality Date  . Gastric bypass    . Dilation and curettage of uterus  2011  . Ovarian cyst removal    . Rotator cuff repair      right       The following portions of the patient's history were reviewed and updated as appropriate: Allergies, current medications, and problem list.    Review of Systems:   Patient denies headache, fevers, malaise, unintentional weight loss, skin rash, eye pain, sinus congestion and sinus pain, sore throat, dysphagia,  hemoptysis , cough, dyspnea, wheezing, chest pain, palpitations, orthopnea, edema, abdominal pain, nausea, melena, diarrhea, constipation, flank pain, dysuria, hematuria, urinary  Frequency, nocturia, numbness, tingling, seizures,  Focal weakness, Loss of consciousness,  Tremor, insomnia, depression, anxiety, and suicidal ideation.     History   Social History  . Marital Status: Married    Spouse Name: N/A    Number of Children: N/A  . Years of Education: N/A   Occupational History  . Not on file.   Social History Main Topics  . Smoking  status: Former Smoker -- 1.00 packs/day for 4 years    Types: Cigarettes    Quit date: 02/05/1996  . Smokeless tobacco: Never Used  . Alcohol Use: Yes     Comment: Once a year.  . Drug Use: No  . Sexual Activity: Not on file   Other Topics Concern  . Not on file   Social History Narrative  . No narrative on file    Objective:  Filed Vitals:   08/28/14 1521  BP: 106/86  Pulse: 120  Temp: 98.3 F (36.8 C)  Resp: 16     General appearance: alert, cooperative and appears stated age Ears: normal TM's and external ear canals both ears Throat: lips, mucosa, and tongue normal; teeth and gums normal Neck: no adenopathy, no carotid bruit, supple, symmetrical, trachea midline and thyroid not enlarged, symmetric, no  tenderness/mass/nodules Back: symmetric, no curvature. ROM normal. No CVA tenderness. Lungs: clear to auscultation bilaterally Heart: regular rate and rhythm, S1, S2 normal, no murmur, click, rub or gallop Abdomen: soft, non-tender; bowel sounds normal; no masses,  no organomegaly Pulses: 2+ and symmetric Skin: Skin color, texture, turgor normal. No rashes or lesions Lymph nodes: Cervical, supraclavicular, and axillary nodes normal.  Assessment and Plan:  Obstructive sleep apnea on CPAP Has not worn it in years,  Says her prior study was borderline.  husband does not report apneic spells.   Cellulitis Of abdominal wall,  Right sided pannus,  Started iwht a boil now resolved , with erythema surrounding original boil site about 1.5 cm radius.  Keflex prescribed.    Obesity (BMI 30-39.9) I have addressed  BMI and encouraged her to lose 10% of body weight over the next 6 months using a low glycemic index diet and regular exercise a minimum of 5 days per week.    Diabetes mellitus type 2, uncontrolled Improved but not at goal..  Increases NPH to 55 units and omit the morning dose of glipizide until eating habits are more regulated.  She has nephropathy , and is taking lisinopril.  Reminder for eye exam given.   Lab Results  Component Value Date   HGBA1C 8.3* 08/28/2014   Lab Results  Component Value Date   MICROALBUR 3.6* 05/27/2014   Lab Results  Component Value Date   CHOL 165 05/27/2014   HDL 46.20 05/27/2014   LDLCALC 87 05/27/2014   LDLDIRECT 156.5 06/25/2013   TRIG 161.0* 05/27/2014   CHOLHDL 4 05/27/2014       Updated Medication List Outpatient Encounter Prescriptions as of 08/28/2014  Medication Sig  . acetaminophen (TYLENOL) 325 MG tablet Take 650 mg by mouth every 6 (six) hours as needed.  Marland Kitchen atorvastatin (LIPITOR) 20 MG tablet Take 1 tablet (20 mg total) by mouth daily.  Marland Kitchen buPROPion (WELLBUTRIN XL) 300 MG 24 hr tablet Take 300 mg by mouth daily.  . Cholecalciferol  (VITAMIN D3) 2000 UNITS TABS Take by mouth.  . EPINEPHrine (EPIPEN 2-PAK) 0.3 mg/0.3 mL SOAJ injection Inject 0.3 mLs (0.3 mg total) into the muscle once. For severe allergic reaction  . fluticasone (FLONASE) 50 MCG/ACT nasal spray USE TWO SPRAYS IN EACH NOSTRIL DAILY   . furosemide (LASIX) 20 MG tablet Take 1 tablet (20 mg total) by mouth daily as needed.  Marland Kitchen glipiZIDE (GLUCOTROL) 5 MG tablet Take one tablet by mouth twice daily before meals  . HYDROcodone-acetaminophen (NORCO) 10-325 MG per tablet Take 1 tablet by mouth every 6 (six) hours as needed.  . insulin NPH Human (NOVOLIN  N) 100 UNIT/ML injection Inject 0.55 mLs (55 Units total) into the skin at bedtime.  . Insulin Syringe-Needle U-100 (B-D INS SYR ULTRAFINE 1CC/31G) 31G X 5/16" 1 ML MISC 1 Syringe by Does not apply route daily.  Marland Kitchen KOMBIGLYZE XR 2.04-999 MG TB24 TAKE ONE TABLET BY MOUTH ONE TIME DAILY MAY INCREASE TO 2 TABLET BY MOUTH ONCE A DAY AFTER 1 TO 2 WEEKS IF NEEDED   . lamoTRIgine (LAMICTAL) 150 MG tablet Take 150 mg by mouth daily.  [name] Device MISC Test blood sugar two times daily  . lisinopril (PRINIVIL,ZESTRIL) 5 MG tablet Take 1 tablet (5 mg total) by mouth daily.  . meclizine (ANTIVERT) 25 MG tablet Take 1 tablet (25 mg total) by mouth 3 (three) times daily as needed.  . methocarbamol (ROBAXIN) 500 MG tablet TAKE ONE TABLET BY MOUTH FOUR TIMES DAILY   . nabumetone (RELAFEN) 750 MG tablet Take 1 tablet (750 mg total) by mouth daily.  Marland Kitchen nystatin (MYCOSTATIN/NYSTOP) 100000 UNIT/GM POWD apply to affected area(s) twice daily   . OnabotulinumtoxinA (BOTOX IJ) Inject as directed every 3 (three) months. For migraine  . ONETOUCH VERIO test strip TEST BLOOD SUGAR TWICE DAILY   . potassium chloride (MICRO-K) 10 MEQ CR capsule TAKE TWO CAPSULES BY MOUTH DAILY  . pregabalin (LYRICA) 50 MG capsule Take 50 mg by mouth as needed.  . Prenatal Vit-Fe Fumarate-FA (PRENATAL MULTIVITAMIN) TABS Take 1 tablet by mouth daily.  . promethazine  (PHENERGAN) 25 MG tablet Take 1 tablet (25 mg total) by mouth every 8 (eight) hours as needed for nausea or vomiting.  . SUMAtriptan (IMITREX) 100 MG tablet Take one tablet by mouth daily as needed  . traMADol (ULTRAM) 50 MG tablet Take one tablet by mouth every 6 hours  as needed for relief from pain  . traZODone (DESYREL) 50 MG tablet Take 50 mg by mouth at bedtime as needed.   . VENTOLIN HFA 108 (90 BASE) MCG/ACT inhaler INHALE ONE PUFF BY MOUTH EVERY SIX HOURS AS NEEDED FOR WHEEZING   . [DISCONTINUED] NOVOLIN N 100 UNIT/ML injection inject 0.5 mLs (50 units) into the skin at bedtime   . cephALEXin (KEFLEX) 500 MG capsule Take 1 capsule (500 mg total) by mouth 4 (four) times daily.  . ciprofloxacin-dexamethasone (CIPRODEX) otic suspension Place 4 drops into the right ear 2 (two) times daily.  . fluconazole (DIFLUCAN) 150 MG tablet Take 1 tablet (150 mg total) by mouth daily.     Orders Placed This Encounter  Procedures  . Tdap vaccine greater than or equal to 7yo IM  . Comprehensive metabolic panel  . Hemoglobin A1c  . CBC with Differential    No Follow-up on file.

## 2014-08-28 NOTE — Assessment & Plan Note (Signed)
Has not worn it in years,  Says her prior study was borderline.  husband does not report apneic spells.

## 2014-08-28 NOTE — Patient Instructions (Signed)
We made several changes to your diabetes regiemn today:  Increase your bedtime insulin to 55 units  Omit the morning dose of glipizide .  Continue the evening dose   Continue the Kombiglyze   Please get your annual diabetic eye exam ASAP  Return in 3 months

## 2014-08-29 LAB — HEMOGLOBIN A1C
HEMOGLOBIN A1C: 8.3 % — AB (ref <5.7)
Mean Plasma Glucose: 192 mg/dL — ABNORMAL HIGH (ref <117)

## 2014-08-30 ENCOUNTER — Other Ambulatory Visit: Payer: Self-pay | Admitting: Internal Medicine

## 2014-08-30 ENCOUNTER — Encounter: Payer: Self-pay | Admitting: Internal Medicine

## 2014-08-30 DIAGNOSIS — L0390 Cellulitis, unspecified: Secondary | ICD-10-CM | POA: Insufficient documentation

## 2014-08-30 NOTE — Assessment & Plan Note (Addendum)
Improved but not at goal..  Increases NPH to 55 units and omit the morning dose of glipizide until eating habits are more regulated.  She has nephropathy , and is taking lisinopril.  Reminder for eye exam given.   Lab Results  Component Value Date   HGBA1C 8.3* 08/28/2014   Lab Results  Component Value Date   MICROALBUR 3.6* 05/27/2014   Lab Results  Component Value Date   CHOL 165 05/27/2014   HDL 46.20 05/27/2014   LDLCALC 87 05/27/2014   LDLDIRECT 156.5 06/25/2013   TRIG 161.0* 05/27/2014   CHOLHDL 4 05/27/2014

## 2014-08-30 NOTE — Assessment & Plan Note (Signed)
I have addressed  BMI and encouraged her to lose 10% of body weight over the next 6 months using a low glycemic index diet and regular exercise a minimum of 5 days per week.

## 2014-08-30 NOTE — Assessment & Plan Note (Signed)
Of abdominal wall,  Right sided pannus,  Started iwht a boil now resolved , with erythema surrounding original boil site about 1.5 cm radius.  Keflex prescribed.

## 2014-08-31 MED ORDER — SAXAGLIPTIN-METFORMIN ER 2.5-1000 MG PO TB24: ORAL_TABLET | ORAL | Status: DC

## 2014-08-31 MED ORDER — GLIPIZIDE 5 MG PO TABS: ORAL_TABLET | ORAL | Status: DC

## 2014-09-02 ENCOUNTER — Other Ambulatory Visit: Payer: Self-pay | Admitting: Internal Medicine

## 2014-10-01 ENCOUNTER — Encounter: Payer: Self-pay | Admitting: Internal Medicine

## 2014-10-16 NOTE — Telephone Encounter (Signed)
FYI

## 2014-10-19 ENCOUNTER — Other Ambulatory Visit: Payer: Self-pay | Admitting: Internal Medicine

## 2014-10-19 MED ORDER — HYDROCHLOROTHIAZIDE 12.5 MG PO TABS: 12.5000 mg | ORAL_TABLET | Freq: Every day | ORAL | Status: DC

## 2014-11-14 ENCOUNTER — Other Ambulatory Visit: Payer: Self-pay | Admitting: Internal Medicine

## 2014-11-23 ENCOUNTER — Other Ambulatory Visit: Payer: Self-pay | Admitting: Internal Medicine

## 2014-12-01 ENCOUNTER — Ambulatory Visit: Payer: BC Managed Care – PPO | Admitting: Internal Medicine

## 2014-12-12 ENCOUNTER — Other Ambulatory Visit: Payer: Self-pay | Admitting: Internal Medicine

## 2014-12-27 ENCOUNTER — Other Ambulatory Visit: Payer: Self-pay | Admitting: Internal Medicine

## 2014-12-31 ENCOUNTER — Other Ambulatory Visit: Payer: Self-pay | Admitting: Internal Medicine

## 2014-12-31 NOTE — Telephone Encounter (Signed)
Last refill 12.5.15.  Please advise refill

## 2015-01-09 ENCOUNTER — Other Ambulatory Visit: Payer: Self-pay | Admitting: Internal Medicine

## 2015-01-24 ENCOUNTER — Other Ambulatory Visit: Payer: Self-pay | Admitting: Internal Medicine

## 2015-02-07 ENCOUNTER — Other Ambulatory Visit: Payer: Self-pay | Admitting: Internal Medicine

## 2015-02-07 DIAGNOSIS — E1165 Type 2 diabetes mellitus with hyperglycemia: Secondary | ICD-10-CM

## 2015-02-07 DIAGNOSIS — IMO0002 Reserved for concepts with insufficient information to code with codable children: Secondary | ICD-10-CM

## 2015-02-08 NOTE — Telephone Encounter (Signed)
Refill sent  PLEASE tell patient that she must make an appt ASAP to follow up on uncontrolled DM,. Last a1c was 8.3 in September and she is 3 months overdue as of tomorrow for follow up. Needs fasting labs priro to appt

## 2015-02-08 NOTE — Telephone Encounter (Signed)
Ok refill? Last visit 08/28/14

## 2015-02-08 NOTE — Telephone Encounter (Signed)
Sent mychart on need for appt 

## 2015-02-15 NOTE — Telephone Encounter (Signed)
Mailed unread message to pt  

## 2015-03-22 ENCOUNTER — Other Ambulatory Visit: Payer: Self-pay | Admitting: Internal Medicine

## 2015-04-17 ENCOUNTER — Other Ambulatory Visit: Payer: Self-pay | Admitting: Internal Medicine

## 2015-04-19 NOTE — Telephone Encounter (Signed)
Left message to return call for appoint

## 2015-04-20 NOTE — Telephone Encounter (Signed)
Last OV 9.18.15, last refill 10.24.15.  I left message yesterday on pts VM to return call for appoint.  Please advise refill.

## 2015-04-20 NOTE — Telephone Encounter (Signed)
No refills until seen,  She is 6 months overdue for follow up on uncontrolled DM

## 2015-04-21 NOTE — Telephone Encounter (Signed)
Left message on VM to return call for appoint 

## 2015-04-25 ENCOUNTER — Other Ambulatory Visit: Payer: Self-pay | Admitting: Internal Medicine

## 2015-04-25 DIAGNOSIS — E785 Hyperlipidemia, unspecified: Secondary | ICD-10-CM

## 2015-04-25 DIAGNOSIS — E559 Vitamin D deficiency, unspecified: Secondary | ICD-10-CM

## 2015-04-25 DIAGNOSIS — E1165 Type 2 diabetes mellitus with hyperglycemia: Secondary | ICD-10-CM

## 2015-04-25 DIAGNOSIS — R0600 Dyspnea, unspecified: Secondary | ICD-10-CM

## 2015-04-25 DIAGNOSIS — IMO0002 Reserved for concepts with insufficient information to code with codable children: Secondary | ICD-10-CM

## 2015-04-26 NOTE — Telephone Encounter (Signed)
60 day supply authorized and sent  please be sure patient has fasting labs prior to her office visit

## 2015-04-26 NOTE — Telephone Encounter (Signed)
Pt scheduled for labs

## 2015-04-26 NOTE — Telephone Encounter (Signed)
Last OV 9.18.15, pt scheduled appoint 6.20.16.  Please advise refill

## 2015-05-10 ENCOUNTER — Other Ambulatory Visit: Payer: Self-pay | Admitting: Internal Medicine

## 2015-05-11 NOTE — Telephone Encounter (Signed)
Pt last seen 9.18.15, next appt on 6.17.16. Ok to fill?

## 2015-05-12 NOTE — Telephone Encounter (Signed)
Refill for 30 days only.  Will need six month follow up prior to any more refills, fasting labs prior to appt

## 2015-05-26 ENCOUNTER — Encounter: Payer: Self-pay | Admitting: Internal Medicine

## 2015-05-26 ENCOUNTER — Ambulatory Visit (INDEPENDENT_AMBULATORY_CARE_PROVIDER_SITE_OTHER): Payer: BC Managed Care – PPO | Admitting: Internal Medicine

## 2015-05-26 VITALS — BP 96/68 | HR 99 | Temp 98.0°F | Ht 64.0 in | Wt 247.0 lb

## 2015-05-26 DIAGNOSIS — L03115 Cellulitis of right lower limb: Secondary | ICD-10-CM | POA: Diagnosis not present

## 2015-05-26 DIAGNOSIS — L0103 Bullous impetigo: Secondary | ICD-10-CM | POA: Insufficient documentation

## 2015-05-26 MED ORDER — GENTAMICIN SULFATE 0.1 % EX OINT: 1.0000 "application " | TOPICAL_OINTMENT | Freq: Three times a day (TID) | CUTANEOUS | Status: DC

## 2015-05-26 MED ORDER — DOXYCYCLINE HYCLATE 100 MG PO TABS: 100.0000 mg | ORAL_TABLET | Freq: Two times a day (BID) | ORAL | Status: DC

## 2015-05-26 NOTE — Patient Instructions (Signed)
Start Doxycycline 100mg  twice daily and topical Gentamicin twice daily to area.  Call immediately if fever, chills or increased redness.  Follow up on Monday.

## 2015-05-26 NOTE — Progress Notes (Signed)
   Subjective:    Patient ID: Maria Bentley, female    DOB: 1970-12-16, 44 y.o.   MRN: [id number]  HPI  44YO female presents for acute visit.  Area behind right leg started last week. Itchy. At first thought is was a mosquito bite. Some chills. No measured fever. Had a boil last summer. Her mother has MRSA and lives with her.  Past medical, surgical, family and social history per today's encounter.  Review of Systems  Constitutional: Positive for chills and fatigue. Negative for fever, appetite change and unexpected weight change.  Eyes: Negative for visual disturbance.  Respiratory: Negative for shortness of breath.   Cardiovascular: Negative for chest pain and leg swelling.  Gastrointestinal: Negative for abdominal pain.  Skin: Positive for color change and wound. Negative for rash.  Hematological: Negative for adenopathy. Does not bruise/bleed easily.  Psychiatric/Behavioral: Negative for dysphoric mood. The patient is not nervous/anxious.        Objective:    BP 96/68 mmHg  Pulse 99  Temp(Src) 98 F (36.7 C) (Oral)  Ht 5\' 4"  (1.626 m)  Wt 247 lb (112.038 kg)  BMI 42.38 kg/m2  SpO2 98% Physical Exam  Constitutional: She is oriented to person, place, and time. She appears well-developed and well-nourished. No distress.  HENT:  Head: Normocephalic and atraumatic.  Right Ear: External ear normal.  Left Ear: External ear normal.  Nose: Nose normal.  Mouth/Throat: Oropharynx is clear and moist. No oropharyngeal exudate.  Eyes: Conjunctivae are normal. Pupils are equal, round, and reactive to light. Right eye exhibits no discharge. Left eye exhibits no discharge. No scleral icterus.  Neck: Normal range of motion. Neck supple. No tracheal deviation present. No thyromegaly present.  Pulmonary/Chest: Effort normal.  Musculoskeletal: Normal range of motion. She exhibits no edema or tenderness.  Lymphadenopathy:    She has no cervical adenopathy.  Neurological: She is  alert and oriented to person, place, and time. No cranial nerve deficit. She exhibits normal muscle tone. Coordination normal.  Skin: Skin is warm and dry. No rash noted. She is not diaphoretic. There is erythema. No pallor.     Psychiatric: She has a normal mood and affect. Her behavior is normal. Judgment and thought content normal.          Assessment & Plan:   Problem List Items Addressed This Visit      Unprioritized   Cellulitis of leg, right - Primary    Exam is consistent with cellulitis. Will start topical gentamicin and oral Doxycycline. Discussed potential risks and side effects of this medication. Plan for follow up in 1 week for recheck or sooner if symptoms are worsening.      Relevant Medications   doxycycline (VIBRA-TABS) 100 MG tablet   gentamicin ointment (GARAMYCIN) 0.1 %       Return if symptoms worsen or fail to improve.

## 2015-05-26 NOTE — Assessment & Plan Note (Signed)
Exam is consistent with cellulitis. Will start topical gentamicin and oral Doxycycline. Discussed potential risks and side effects of this medication. Plan for follow up in 1 week for recheck or sooner if symptoms are worsening.

## 2015-05-26 NOTE — Progress Notes (Signed)
Pre visit review using our clinic review tool, if applicable. No additional management support is needed unless otherwise documented below in the visit note. 

## 2015-05-28 ENCOUNTER — Other Ambulatory Visit (INDEPENDENT_AMBULATORY_CARE_PROVIDER_SITE_OTHER): Payer: BC Managed Care – PPO

## 2015-05-28 ENCOUNTER — Encounter: Payer: Self-pay | Admitting: Internal Medicine

## 2015-05-28 ENCOUNTER — Telehealth: Payer: Self-pay

## 2015-05-28 ENCOUNTER — Ambulatory Visit (INDEPENDENT_AMBULATORY_CARE_PROVIDER_SITE_OTHER): Payer: BC Managed Care – PPO | Admitting: Internal Medicine

## 2015-05-28 VITALS — BP 126/78 | HR 104 | Temp 97.9°F | Resp 14 | Ht 64.0 in | Wt 242.8 lb

## 2015-05-28 DIAGNOSIS — E785 Hyperlipidemia, unspecified: Secondary | ICD-10-CM

## 2015-05-28 DIAGNOSIS — IMO0002 Reserved for concepts with insufficient information to code with codable children: Secondary | ICD-10-CM

## 2015-05-28 DIAGNOSIS — E1165 Type 2 diabetes mellitus with hyperglycemia: Secondary | ICD-10-CM

## 2015-05-28 DIAGNOSIS — L0103 Bullous impetigo: Secondary | ICD-10-CM

## 2015-05-28 DIAGNOSIS — E559 Vitamin D deficiency, unspecified: Secondary | ICD-10-CM

## 2015-05-28 DIAGNOSIS — L237 Allergic contact dermatitis due to plants, except food: Secondary | ICD-10-CM | POA: Diagnosis not present

## 2015-05-28 LAB — COMPREHENSIVE METABOLIC PANEL
ALT: 13 U/L (ref 0–35)
AST: 12 U/L (ref 0–37)
Albumin: 3.7 g/dL (ref 3.5–5.2)
Alkaline Phosphatase: 92 U/L (ref 39–117)
BUN: 17 mg/dL (ref 6–23)
CALCIUM: 9.3 mg/dL (ref 8.4–10.5)
CHLORIDE: 101 meq/L (ref 96–112)
CO2: 27 meq/L (ref 19–32)
Creatinine, Ser: 1.07 mg/dL (ref 0.40–1.20)
GFR: 58.97 mL/min — ABNORMAL LOW (ref >60.00)
Glucose, Bld: 139 mg/dL — ABNORMAL HIGH (ref 70–99)
Potassium: 4.1 mEq/L (ref 3.5–5.1)
Sodium: 134 mEq/L — ABNORMAL LOW (ref 135–145)
Total Bilirubin: 0.3 mg/dL (ref 0.2–1.2)
Total Protein: 6.7 g/dL (ref 6.0–8.3)

## 2015-05-28 LAB — CBC WITH DIFFERENTIAL/PLATELET
BASOS ABS: 0.1 10*3/uL (ref 0.0–0.1)
Basophils Relative: 0.7 % (ref 0.0–3.0)
EOS PCT: 0.8 % (ref 0.0–5.0)
Eosinophils Absolute: 0.1 10*3/uL (ref 0.0–0.7)
HCT: 38.7 % (ref 36.0–46.0)
Hemoglobin: 12.1 g/dL (ref 12.0–15.0)
Lymphocytes Relative: 34.5 % (ref 12.0–46.0)
Lymphs Abs: 3 10*3/uL (ref 0.7–4.0)
MCHC: 31.3 g/dL (ref 30.0–36.0)
MCV: 83.3 fl (ref 78.0–100.0)
MONOS PCT: 5.2 % (ref 3.0–12.0)
Monocytes Absolute: 0.4 10*3/uL (ref 0.1–1.0)
NEUTROS PCT: 58.8 % (ref 43.0–77.0)
Neutro Abs: 5.1 10*3/uL (ref 1.4–7.7)
PLATELETS: 484 10*3/uL — AB (ref 150.0–400.0)
RBC: 4.64 Mil/uL (ref 3.87–5.11)
RDW: 16.5 % — AB (ref 11.5–15.5)
WBC: 8.6 10*3/uL (ref 4.0–10.5)

## 2015-05-28 LAB — LIPID PANEL
CHOL/HDL RATIO: 6
Cholesterol: 196 mg/dL (ref 0–200)
HDL: 34.1 mg/dL — ABNORMAL LOW (ref >39.00)
LDL Cholesterol: 125 mg/dL — ABNORMAL HIGH (ref 0–99)
NonHDL: 161.9
Triglycerides: 183 mg/dL — ABNORMAL HIGH (ref 0.0–149.0)
VLDL: 36.6 mg/dL (ref 0.0–40.0)

## 2015-05-28 LAB — VITAMIN D 25 HYDROXY (VIT D DEFICIENCY, FRACTURES): VITD: 18.41 ng/mL — AB (ref 30.00–100.00)

## 2015-05-28 LAB — HEMOGLOBIN A1C: Hgb A1c MFr Bld: 12 % — ABNORMAL HIGH (ref 4.6–6.5)

## 2015-05-28 MED ORDER — SULFAMETHOXAZOLE-TRIMETHOPRIM 800-160 MG PO TABS: 1.0000 | ORAL_TABLET | Freq: Two times a day (BID) | ORAL | Status: DC

## 2015-05-28 MED ORDER — PREDNISONE 20 MG PO TABS: ORAL_TABLET | ORAL | Status: DC

## 2015-05-28 MED ORDER — PROMETHAZINE HCL 25 MG PO TABS: 25.0000 mg | ORAL_TABLET | Freq: Three times a day (TID) | ORAL | Status: DC | PRN

## 2015-05-28 NOTE — Telephone Encounter (Signed)
Pharmacy notified. Bactrim cancelled. Phenergan and prednisone prescriptions received. Pt notified as well. Verbalized understanding.  Encouraged to call back with questions or concerns.

## 2015-05-28 NOTE — Telephone Encounter (Signed)
Pharmacy called and is uncomfortable filling new prescription. Pt has an allergy to Sulfa. Prescribed Bactrim.  Please advise?

## 2015-05-28 NOTE — Patient Instructions (Signed)
I am treating you for bullous impetigo,  Which may be due to staph or streptococcal infection of the skin that still may have been due to poison sumac   Start the new antibiotic ,  Septra,  immediately.  Do not start the prednisone unless the itching is really aggravating  AND  Redness starts to improve.  You can then start the prednisone a day later to manage the itching.    If the redness spreads beyond the pen marks,  You will need IV antibiotics so you will need to go to the nearest ER

## 2015-05-28 NOTE — Progress Notes (Signed)
Pre-visit discussion using our clinic review tool. No additional management support is needed unless otherwise documented below in the visit note.  

## 2015-05-28 NOTE — Telephone Encounter (Signed)
Agree with NOT filling.  She will need to continue the doxycycline since we have no other options.  Take with food and pre treat with phenergan.  Go ahead and start the prednisone since we are not changing antibiotics

## 2015-05-28 NOTE — Progress Notes (Signed)
Subjective:  Patient ID: Maria Bentley, female    DOB: December 06, 1971  Age: 44 y.o. MRN: [id number]  CC: The primary encounter diagnosis was Allergic contact dermatitis due to plants, except food. A diagnosis of Bullous impetigo was also pertinent to this visit.  HPI Maria Bentley presents for PERSISTENT REDNESS, crusting  AND SWELLING Of LEG. Patient was treated with oral doxycycline and topical gentamycin  by Dr Gilford Rile 2 days ago presumed Staph infection . patietn states that the redness has   No facility-administered medications prior to visit.   Outpatient Prescriptions Prior to Visit  Medication Sig Dispense Refill  . acetaminophen (TYLENOL) 325 MG tablet Take 650 mg by mouth every 6 (six) hours as needed.    Marland Kitchen buPROPion (WELLBUTRIN XL) 300 MG 24 hr tablet Take 300 mg by mouth daily.    Marland Kitchen doxycycline (VIBRA-TABS) 100 MG tablet Take 1 tablet (100 mg total) by mouth 2 (two) times daily. 20 tablet 0  . EPINEPHrine (EPIPEN 2-PAK) 0.3 mg/0.3 mL SOAJ injection Inject 0.3 mLs (0.3 mg total) into the muscle once. For severe allergic reaction 1 Device 5  . fluticasone (FLONASE) 50 MCG/ACT nasal spray USE TWO SPRAYS IN EACH NOSTRIL DAILY  16 g 3  . gentamicin ointment (GARAMYCIN) 0.1 % Apply 1 application topically 3 (three) times daily. 15 g 0  . glipiZIDE (GLUCOTROL) 5 MG tablet TAKE ONE TABLET BY MOUTH TWICE DAILY BEFORE MEALS  180 tablet 0  . hydrochlorothiazide (MICROZIDE) 12.5 MG capsule TAKE ONE CAPSULE BY MOUTH ONE TIME DAILY 30 capsule 1  . HYDROcodone-acetaminophen (NORCO) 10-325 MG per tablet Take 1 tablet by mouth every 6 (six) hours as needed.    . Insulin Syringe-Needle U-100 (B-D INS SYR ULTRAFINE 1CC/31G) 31G X 5/16" 1 ML MISC 1 Syringe by Does not apply route daily. 100 each 0  . Lancet Device MISC Test blood sugar two times daily 100 each 6  . lisinopril (PRINIVIL,ZESTRIL) 5 MG tablet Take 1 tablet (5 mg total) by mouth daily. 90 tablet 3  . meclizine (ANTIVERT) 25  MG tablet Take 1 tablet (25 mg total) by mouth 3 (three) times daily as needed. (Patient taking differently: Take 25 mg by mouth 3 (three) times daily as needed for dizziness. ) 90 tablet 6  . methocarbamol (ROBAXIN) 500 MG tablet TAKE ONE TABLET BY MOUTH FOUR TIMES DAILY  (Patient taking differently: Take one or two tabs four times daily, as needed) 90 tablet 3  . NOVOLIN N 100 UNIT/ML injection inject 55 units into the skin at bedtime 20 mL 5  . nystatin (MYCOSTATIN/NYSTOP) 100000 UNIT/GM POWD Apply to affected area  twice a day 60 g 0  . OnabotulinumtoxinA (BOTOX IJ) Inject as directed every 3 (three) months. For migraine    . ONETOUCH VERIO test strip TEST BLOOD SUGAR TWICE DAILY  50 each 5  . potassium chloride (MICRO-K) 10 MEQ CR capsule TAKE TWO CAPSULES BY MOUTH DAILY (Patient not taking: Reported on 05/29/2015) 90 capsule 2  . pregabalin (LYRICA) 50 MG capsule Take 300 mg by mouth at bedtime.     . Prenatal Vit-Fe Fumarate-FA (PRENATAL MULTIVITAMIN) TABS Take 1 tablet by mouth daily.    . Saxagliptin-Metformin (KOMBIGLYZE XR) 2.04-999 MG TB24 take 1 tablet by mouth one time a day may increase to 2 tablets by mouth once a day after 1 to 2 weeks if needed 60 tablet 5  . SUMAtriptan (IMITREX) 100 MG tablet TAKE ONE TABLET BY MOUTH DAILY AS NEEDED  9 tablet 0  . traMADol (ULTRAM) 50 MG tablet Take one tablet by mouth every 6 hours  as needed for relief from pain (Patient not taking: Reported on 05/29/2015) 90 tablet 1  . VENTOLIN HFA 108 (90 BASE) MCG/ACT inhaler INHALE ONE PUFF BY MOUTH EVERY SIX HOURS AS NEEDED FOR WHEEZING  18 g 3  . atorvastatin (LIPITOR) 20 MG tablet TAKE ONE TABLET BY MOUTH ONE TIME DAILY  90 tablet 2  . furosemide (LASIX) 20 MG tablet Take 1 tablet (20 mg total) by mouth daily as needed. 90 tablet 3  . lamoTRIgine (LAMICTAL) 150 MG tablet Take 150 mg by mouth daily.    . promethazine (PHENERGAN) 25 MG tablet TAKE ONE TABLET BY MOUTH EVERY EIGHT HOURS AS NEEDED  20 tablet 3      Review of Systems;  Patient denies headache, fevers, malaise, unintentional weight loss, skin rash, eye pain, sinus congestion and sinus pain, sore throat, dysphagia,  hemoptysis , cough, dyspnea, wheezing, chest pain, palpitations, orthopnea, edema, abdominal pain, nausea, melena, diarrhea, constipation, flank pain, dysuria, hematuria, urinary  Frequency, nocturia, numbness, tingling, seizures,  Focal weakness, Loss of consciousness,  Tremor, insomnia, depression, anxiety, and suicidal ideation.      Objective:  BP 126/78 mmHg  Pulse 104  Temp(Src) 97.9 F (36.6 C) (Oral)  Resp 14  Ht 5\' 4"  (1.626 m)  Wt 242 lb 12 oz (110.111 kg)  BMI 41.65 kg/m2  SpO2 98%  BP Readings from Last 3 Encounters:  05/30/15 132/75  05/28/15 126/78  05/26/15 96/68    Wt Readings from Last 3 Encounters:  05/29/15 245 lb 11.2 oz (111.449 kg)  05/28/15 242 lb 12 oz (110.111 kg)  05/26/15 247 lb (112.038 kg)    General appearance: alert, cooperative and appears stated age Ears: normal TM's and external ear canals both ears Throat: lips, mucosa, and tongue normal; teeth and gums normal Neck: no adenopathy, no carotid bruit, supple, symmetrical, trachea midline and thyroid not enlarged, symmetric, no tenderness/mass/nodules Back: symmetric, no curvature. ROM normal. No CVA tenderness. Lungs: clear to auscultation bilaterally Heart: regular rate and rhythm, S1, S2 normal, no murmur, click, rub or gallop Abdomen: soft, non-tender; bowel sounds normal; no masses,  no organomegaly Pulses: 2+ and symmetric Skin/nails, posteriori leg /popliteal area with intense erythema, 2 small bullae, satellite lessions in a linera patten   Lymph nodes: Cervical, supraclavicular, and axillary nodes normal.  Lab Results  Component Value Date   HGBA1C 12.0* 05/28/2015   HGBA1C 8.3* 08/28/2014   HGBA1C 8.5* 05/27/2014    Lab Results  Component Value Date   CREATININE 1.29* 05/29/2015   CREATININE 1.07  05/28/2015   CREATININE 1.00 08/28/2014    Lab Results  Component Value Date   WBC 2.9* 05/29/2015   HGB 12.9 05/29/2015   HCT 41.6 05/29/2015   PLT 427 05/29/2015   GLUCOSE 704* 05/29/2015   CHOL 196 05/28/2015   TRIG 183.0* 05/28/2015   HDL 34.10* 05/28/2015   LDLDIRECT 156.5 06/25/2013   LDLCALC 125* 05/28/2015   ALT 16 05/29/2015   AST 14* 05/29/2015   NA 128* 05/29/2015   K 5.1 05/29/2015   CL 97* 05/29/2015   CREATININE 1.29* 05/29/2015   BUN 31* 05/29/2015   CO2 22 05/29/2015   TSH 1.91 05/27/2014   HGBA1C 12.0* 05/28/2015   MICROALBUR 3.6* 05/27/2014    No results found.  Assessment & Plan:   Problem List Items Addressed This Visit    Bullous impetigo  Given her multiple drug allergies,  cephalossporin and beta lactams are not an option. continue doxycycline,  Adding prednisone taper  Given the pruritic compoenent, suggesting contact dermatitis as the initial cause.        Relevant Medications   sulfamethoxazole-trimethoprim (BACTRIM DS,SEPTRA DS) 800-160 MG per tablet   Other Relevant Orders   Wound culture (Completed)    Other Visit Diagnoses    Allergic contact dermatitis due to plants, except food    -  Primary    Relevant Orders    CBC with Differential/Platelet (Completed)    Wound culture (Completed)       I have changed Maria Bentley's promethazine. I am also having her start on predniSONE and sulfamethoxazole-trimethoprim. Additionally, I am having her maintain her buPROPion, prenatal multivitamin, acetaminophen, meclizine, Lancet Device, potassium chloride, methocarbamol, EPINEPHrine, Insulin Syringe-Needle U-100, pregabalin, OnabotulinumtoxinA (BOTOX IJ), HYDROcodone-acetaminophen, VENTOLIN HFA, ONETOUCH VERIO, traMADol, lisinopril, Saxagliptin-Metformin, fluticasone, NOVOLIN N, glipiZIDE, nystatin, hydrochlorothiazide, SUMAtriptan, doxycycline, and gentamicin ointment.  Meds ordered this encounter  Medications  . promethazine (PHENERGAN) 25  MG tablet    Sig: Take 1 tablet (25 mg total) by mouth every 8 (eight) hours as needed.    Dispense:  20 tablet    Refill:  3  . predniSONE (DELTASONE) 20 MG tablet    Sig: 3 tablets daily x 3 days,  Then decrease by 1 tablet daily until gone  (5 days total)    Dispense:  12 tablet    Refill:  0  . sulfamethoxazole-trimethoprim (BACTRIM DS,SEPTRA DS) 800-160 MG per tablet    Sig: Take 1 tablet by mouth 2 (two) times daily.    Dispense:  14 tablet    Refill:  0    Medications Discontinued During This Encounter  Medication Reason  . promethazine (PHENERGAN) 25 MG tablet Reorder    Follow-up: No Follow-up on file.   [name], MD

## 2015-05-29 ENCOUNTER — Inpatient Hospital Stay
Admission: EM | Admit: 2015-05-29 | Discharge: 2015-06-04 | DRG: 872 | Disposition: A | Discharge status: Home / Self Care | Payer: BC Managed Care – PPO | Attending: Internal Medicine | Admitting: Internal Medicine

## 2015-05-29 ENCOUNTER — Encounter: Payer: Self-pay | Admitting: Emergency Medicine

## 2015-05-29 DIAGNOSIS — Z9109 Other allergy status, other than to drugs and biological substances: Secondary | ICD-10-CM | POA: Diagnosis not present

## 2015-05-29 DIAGNOSIS — R739 Hyperglycemia, unspecified: Secondary | ICD-10-CM | POA: Diagnosis not present

## 2015-05-29 DIAGNOSIS — Z6841 Body Mass Index (BMI) 40.0 and over, adult: Secondary | ICD-10-CM

## 2015-05-29 DIAGNOSIS — Z794 Long term (current) use of insulin: Secondary | ICD-10-CM

## 2015-05-29 DIAGNOSIS — L03115 Cellulitis of right lower limb: Secondary | ICD-10-CM | POA: Diagnosis present

## 2015-05-29 DIAGNOSIS — E785 Hyperlipidemia, unspecified: Secondary | ICD-10-CM | POA: Diagnosis present

## 2015-05-29 DIAGNOSIS — Z7951 Long term (current) use of inhaled steroids: Secondary | ICD-10-CM

## 2015-05-29 DIAGNOSIS — Z882 Allergy status to sulfonamides status: Secondary | ICD-10-CM

## 2015-05-29 DIAGNOSIS — Z9884 Bariatric surgery status: Secondary | ICD-10-CM

## 2015-05-29 DIAGNOSIS — Z79891 Long term (current) use of opiate analgesic: Secondary | ICD-10-CM

## 2015-05-29 DIAGNOSIS — G43909 Migraine, unspecified, not intractable, without status migrainosus: Secondary | ICD-10-CM | POA: Diagnosis present

## 2015-05-29 DIAGNOSIS — E860 Dehydration: Secondary | ICD-10-CM | POA: Diagnosis present

## 2015-05-29 DIAGNOSIS — N751 Abscess of Bartholin's gland: Secondary | ICD-10-CM | POA: Diagnosis present

## 2015-05-29 DIAGNOSIS — I10 Essential (primary) hypertension: Secondary | ICD-10-CM | POA: Diagnosis present

## 2015-05-29 DIAGNOSIS — D72819 Decreased white blood cell count, unspecified: Secondary | ICD-10-CM | POA: Diagnosis present

## 2015-05-29 DIAGNOSIS — A419 Sepsis, unspecified organism: Principal | ICD-10-CM | POA: Diagnosis present

## 2015-05-29 DIAGNOSIS — F418 Other specified anxiety disorders: Secondary | ICD-10-CM | POA: Diagnosis present

## 2015-05-29 DIAGNOSIS — E1165 Type 2 diabetes mellitus with hyperglycemia: Secondary | ICD-10-CM | POA: Diagnosis present

## 2015-05-29 DIAGNOSIS — F419 Anxiety disorder, unspecified: Secondary | ICD-10-CM | POA: Diagnosis present

## 2015-05-29 DIAGNOSIS — Z79899 Other long term (current) drug therapy: Secondary | ICD-10-CM | POA: Diagnosis not present

## 2015-05-29 HISTORY — DX: Essential (primary) hypertension: I10

## 2015-05-29 LAB — COMPREHENSIVE METABOLIC PANEL
ALBUMIN: 3.5 g/dL (ref 3.5–5.0)
ALK PHOS: 97 U/L (ref 38–126)
ALT: 16 U/L (ref 14–54)
AST: 14 U/L — AB (ref 15–41)
Anion gap: 9 (ref 5–15)
BUN: 31 mg/dL — ABNORMAL HIGH (ref 6–20)
CHLORIDE: 97 mmol/L — AB (ref 101–111)
CO2: 22 mmol/L (ref 22–32)
Calcium: 8.9 mg/dL (ref 8.9–10.3)
Creatinine, Ser: 1.29 mg/dL — ABNORMAL HIGH (ref 0.44–1.00)
GFR calc Af Amer: 57 mL/min — ABNORMAL LOW (ref >60)
GFR calc non Af Amer: 50 mL/min — ABNORMAL LOW (ref >60)
Glucose, Bld: 704 mg/dL (ref 65–99)
Potassium: 5.1 mmol/L (ref 3.5–5.1)
Sodium: 128 mmol/L — ABNORMAL LOW (ref 135–145)
Total Bilirubin: 0.7 mg/dL (ref 0.3–1.2)
Total Protein: 7.3 g/dL (ref 6.5–8.1)

## 2015-05-29 LAB — CBC WITH DIFFERENTIAL/PLATELET
Basophils Absolute: 0 10*3/uL (ref 0–0.1)
Basophils Relative: 1 %
Eosinophils Absolute: 0 10*3/uL (ref 0–0.7)
Eosinophils Relative: 0 %
HCT: 41.6 % (ref 35.0–47.0)
Hemoglobin: 12.9 g/dL (ref 12.0–16.0)
LYMPHS ABS: 1 10*3/uL (ref 1.0–3.6)
LYMPHS PCT: 36 %
MCH: 26.6 pg (ref 26.0–34.0)
MCHC: 31.1 g/dL — ABNORMAL LOW (ref 32.0–36.0)
MCV: 85.6 fL (ref 80.0–100.0)
MONOS PCT: 6 %
Monocytes Absolute: 0.2 10*3/uL (ref 0.2–0.9)
NEUTROS ABS: 1.6 10*3/uL (ref 1.4–6.5)
NEUTROS PCT: 57 %
Platelets: 427 10*3/uL (ref 150–440)
RBC: 4.86 MIL/uL (ref 3.80–5.20)
RDW: 16 % — ABNORMAL HIGH (ref 11.5–14.5)
WBC: 2.9 10*3/uL — ABNORMAL LOW (ref 3.6–11.0)

## 2015-05-29 LAB — URINALYSIS COMPLETE WITH MICROSCOPIC (ARMC ONLY)
BACTERIA UA: NONE SEEN
BILIRUBIN URINE: NEGATIVE
Hgb urine dipstick: NEGATIVE
Leukocytes, UA: NEGATIVE
Nitrite: NEGATIVE
Protein, ur: NEGATIVE mg/dL
Specific Gravity, Urine: 1.024 (ref 1.005–1.030)
WBC, UA: NONE SEEN WBC/hpf (ref 0–5)
pH: 5 (ref 5.0–8.0)

## 2015-05-29 LAB — GLUCOSE, CAPILLARY
GLUCOSE-CAPILLARY: 501 mg/dL — AB (ref 65–99)
Glucose-Capillary: 397 mg/dL — ABNORMAL HIGH (ref 65–99)

## 2015-05-29 MED ORDER — VANCOMYCIN HCL IN DEXTROSE 1-5 GM/200ML-% IV SOLN
1000.0000 mg | Freq: Once | INTRAVENOUS | Status: AC
  Administered 2015-05-30: 1000 mg via INTRAVENOUS
  Filled 2015-05-29: qty 200

## 2015-05-29 MED ORDER — HYDROXYZINE HCL 50 MG PO TABS
25.0000 mg | ORAL_TABLET | ORAL | Status: DC | PRN
  Administered 2015-05-29 – 2015-06-04 (×19): 25 mg via ORAL
  Filled 2015-05-29 (×19): qty 1

## 2015-05-29 MED ORDER — ACETAMINOPHEN 650 MG RE SUPP: 650.0000 mg | Freq: Four times a day (QID) | RECTAL | Status: DC | PRN

## 2015-05-29 MED ORDER — ACETAMINOPHEN 325 MG PO TABS
650.0000 mg | ORAL_TABLET | Freq: Four times a day (QID) | ORAL | Status: DC | PRN
  Administered 2015-05-30 – 2015-06-01 (×3): 650 mg via ORAL
  Filled 2015-05-29 (×3): qty 2

## 2015-05-29 MED ORDER — INSULIN ASPART 100 UNIT/ML ~~LOC~~ SOLN
SUBCUTANEOUS | Status: AC
  Filled 2015-05-29: qty 1

## 2015-05-29 MED ORDER — HYDROCODONE-ACETAMINOPHEN 10-325 MG PO TABS
1.0000 | ORAL_TABLET | Freq: Four times a day (QID) | ORAL | Status: DC | PRN
  Administered 2015-05-30 – 2015-06-04 (×15): 1 via ORAL
  Filled 2015-05-29 (×15): qty 1

## 2015-05-29 MED ORDER — HEPARIN SODIUM (PORCINE) 5000 UNIT/ML IJ SOLN
5000.0000 [IU] | Freq: Three times a day (TID) | INTRAMUSCULAR | Status: DC
Start: 2015-05-29 — End: 2015-06-04
  Administered 2015-05-29 – 2015-06-04 (×17): 5000 [IU] via SUBCUTANEOUS
  Filled 2015-05-29 (×17): qty 1

## 2015-05-29 MED ORDER — BUPROPION HCL ER (XL) 150 MG PO TB24
300.0000 mg | ORAL_TABLET | Freq: Every day | ORAL | Status: DC
  Administered 2015-05-30 – 2015-06-04 (×6): 300 mg via ORAL
  Filled 2015-05-29 (×6): qty 2

## 2015-05-29 MED ORDER — PREGABALIN 50 MG PO CAPS
50.0000 mg | ORAL_CAPSULE | Freq: Every day | ORAL | Status: DC
  Administered 2015-05-30 – 2015-06-04 (×6): 50 mg via ORAL
  Filled 2015-05-29 (×6): qty 1

## 2015-05-29 MED ORDER — LISINOPRIL 5 MG PO TABS
5.0000 mg | ORAL_TABLET | Freq: Every day | ORAL | Status: DC
  Administered 2015-05-30 – 2015-06-04 (×6): 5 mg via ORAL
  Filled 2015-05-29 (×7): qty 1

## 2015-05-29 MED ORDER — ONDANSETRON HCL 4 MG/2ML IJ SOLN
4.0000 mg | Freq: Once | INTRAMUSCULAR | Status: AC
  Administered 2015-05-29: 4 mg via INTRAVENOUS

## 2015-05-29 MED ORDER — SODIUM CHLORIDE 0.9 % IV SOLN
INTRAVENOUS | Status: DC
  Administered 2015-05-29 – 2015-06-04 (×12): via INTRAVENOUS

## 2015-05-29 MED ORDER — INSULIN ASPART 100 UNIT/ML ~~LOC~~ SOLN
0.0000 [IU] | Freq: Three times a day (TID) | SUBCUTANEOUS | Status: DC
  Administered 2015-05-30: 8 [IU] via SUBCUTANEOUS
  Administered 2015-05-30: 15 [IU] via SUBCUTANEOUS
  Administered 2015-05-31: 5 [IU] via SUBCUTANEOUS
  Administered 2015-05-31: 11 [IU] via SUBCUTANEOUS
  Administered 2015-05-31: 8 [IU] via SUBCUTANEOUS
  Administered 2015-06-01: 5 [IU] via SUBCUTANEOUS
  Administered 2015-06-01: 3 [IU] via SUBCUTANEOUS
  Administered 2015-06-01: 8 [IU] via SUBCUTANEOUS
  Administered 2015-06-02: 5 [IU] via SUBCUTANEOUS
  Administered 2015-06-02 – 2015-06-03 (×3): 3 [IU] via SUBCUTANEOUS
  Administered 2015-06-03: 8 [IU] via SUBCUTANEOUS
  Administered 2015-06-03 – 2015-06-04 (×2): 5 [IU] via SUBCUTANEOUS
  Filled 2015-05-29: qty 5
  Filled 2015-05-29: qty 8
  Filled 2015-05-29: qty 15
  Filled 2015-05-29: qty 3
  Filled 2015-05-29: qty 8
  Filled 2015-05-29: qty 3
  Filled 2015-05-29: qty 8
  Filled 2015-05-29 (×2): qty 5
  Filled 2015-05-29: qty 3
  Filled 2015-05-29: qty 11
  Filled 2015-05-29: qty 5
  Filled 2015-05-29: qty 8
  Filled 2015-05-29: qty 3

## 2015-05-29 MED ORDER — FLUTICASONE PROPIONATE 50 MCG/ACT NA SUSP
2.0000 | Freq: Every day | NASAL | Status: DC
  Administered 2015-05-30 – 2015-06-04 (×6): 2 via NASAL
  Filled 2015-05-29: qty 16

## 2015-05-29 MED ORDER — CEFTRIAXONE SODIUM IN DEXTROSE 20 MG/ML IV SOLN
INTRAVENOUS | Status: AC
  Administered 2015-05-29: 1000 mg
  Filled 2015-05-29: qty 50

## 2015-05-29 MED ORDER — ONDANSETRON HCL 4 MG PO TABS
4.0000 mg | ORAL_TABLET | Freq: Four times a day (QID) | ORAL | Status: DC | PRN
  Administered 2015-05-30: 4 mg via ORAL
  Filled 2015-05-29: qty 1

## 2015-05-29 MED ORDER — LAMOTRIGINE 100 MG PO TABS
150.0000 mg | ORAL_TABLET | Freq: Every day | ORAL | Status: DC
  Administered 2015-05-30 – 2015-05-31 (×2): 150 mg via ORAL
  Filled 2015-05-29 (×2): qty 2

## 2015-05-29 MED ORDER — ATORVASTATIN CALCIUM 20 MG PO TABS
20.0000 mg | ORAL_TABLET | Freq: Every day | ORAL | Status: DC
  Administered 2015-05-30 – 2015-06-04 (×6): 20 mg via ORAL
  Filled 2015-05-29 (×6): qty 1

## 2015-05-29 MED ORDER — CEFTRIAXONE SODIUM 1 G IJ SOLR
1.0000 g | Freq: Once | INTRAMUSCULAR | Status: AC
Start: 2015-05-29 — End: 2015-05-29

## 2015-05-29 MED ORDER — METHOCARBAMOL 500 MG PO TABS
500.0000 mg | ORAL_TABLET | Freq: Four times a day (QID) | ORAL | Status: DC
  Administered 2015-05-30 – 2015-05-31 (×7): 500 mg via ORAL
  Filled 2015-05-29 (×9): qty 1

## 2015-05-29 MED ORDER — MORPHINE SULFATE 2 MG/ML IJ SOLN: 2.0000 mg | INTRAMUSCULAR | Status: DC | PRN

## 2015-05-29 MED ORDER — ONDANSETRON HCL 4 MG/2ML IJ SOLN
INTRAMUSCULAR | Status: AC
  Filled 2015-05-29: qty 2

## 2015-05-29 MED ORDER — ONDANSETRON HCL 4 MG/2ML IJ SOLN: 4.0000 mg | Freq: Four times a day (QID) | INTRAMUSCULAR | Status: DC | PRN

## 2015-05-29 MED ORDER — SODIUM CHLORIDE 0.9 % IV BOLUS (SEPSIS)
1000.0000 mL | Freq: Once | INTRAVENOUS | Status: AC
  Administered 2015-05-29: 1000 mL via INTRAVENOUS

## 2015-05-29 MED ORDER — INSULIN ASPART 100 UNIT/ML ~~LOC~~ SOLN
0.0000 [IU] | Freq: Every day | SUBCUTANEOUS | Status: DC
  Administered 2015-05-30 – 2015-05-31 (×3): 5 [IU] via SUBCUTANEOUS
  Administered 2015-06-03: 3 [IU] via SUBCUTANEOUS
  Filled 2015-05-29 (×2): qty 5
  Filled 2015-05-29: qty 3
  Filled 2015-05-29 (×2): qty 5

## 2015-05-29 MED ORDER — INSULIN ASPART 100 UNIT/ML ~~LOC~~ SOLN
10.0000 [IU] | Freq: Once | SUBCUTANEOUS | Status: AC
  Administered 2015-05-29: 10 [IU] via INTRAVENOUS

## 2015-05-29 NOTE — ED Notes (Signed)
Pt states that she doesn't really check her CBG at home lately and that she took insulin last night before last (missed last nights dose).

## 2015-05-29 NOTE — H&P (Signed)
[facility] [place] NAME: Maria Bentley    MR#:  [id number]  DATE OF BIRTH:  [birth year]   DATE OF ADMISSION:  05/29/2015  PRIMARY CARE PHYSICIAN: [name], MD   REQUESTING/REFERRING PHYSICIAN: Liz Beach Triplett/[place]  CHIEF COMPLAINT:   Chief Complaint  Patient presents with  . Cellulitis    HISTORY OF PRESENT ILLNESS:  Maria Bentley  is a 44 y.o. female with a known history of poorly controlled type 2 diabetes presenting with cellulitis right leg. She describes a day duration reddened area on the back of her right leg. States that it started with a bug bite has since spread, developed erythema, warmth, itching. Saw her PCP for the above symptoms started on doxycycline as well as topical gentamicin. Her symptoms progressed and she was then subsequently started on oral steroids. Despite these above measures still no improvement thus presented to the Hospital further workup and evaluation. She has been complaining of subjective chills or denies fevers  PAST MEDICAL HISTORY:   Past Medical History  Diagnosis Date  . Diabetes mellitus   . Morbid obesity     s/p gastric bypass  . Anxiety   . Headache, common migraine, intractable   . Hyperlipidemia   . Depression 2010  . Anemia   . Rosacea   . Personal hx of gastric bypass 2010  . Vertigo   . Kidney stones     left  . Hypertension     PAST SURGICAL HISTORY:   Past Surgical History  Procedure Laterality Date  . Gastric bypass    . Dilation and curettage of uterus  2011  . Ovarian cyst removal    . Rotator cuff repair      right    SOCIAL HISTORY:   History  Substance Use Topics  . Smoking status: Former Smoker -- 1.00 packs/day for 4 years    Types: Cigarettes    Quit date: 02/05/1996  . Smokeless tobacco: Never Used  . Alcohol Use: Yes     Comment: Once a year.    FAMILY HISTORY:   Family History  Problem Relation Age of Onset  .  Diabetes Mother   . Hypertension Mother   . Cancer Neg Hx   . Depression Other     strong hx of  . Hypertension Father   . Diabetes Father     DRUG ALLERGIES:   Allergies  Allergen Reactions  . Avocado Swelling  . Sulfa Drugs Cross Reactors Anaphylaxis  . Adhesive [Tape]   . Amoxicillin Hives  . Clarithromycin   . Honey   . Watermelon Concentrate     REVIEW OF SYSTEMS:  REVIEW OF SYSTEMS:  CONSTITUTIONAL: Denies fevers, positive chills, fatigue, weakness.  EYES: Denies blurred vision, double vision, or eye pain.  EARS, NOSE, THROAT: Denies tinnitus, ear pain, hearing loss.  RESPIRATORY: denies cough, shortness of breath, wheezing  CARDIOVASCULAR: Denies chest pain, palpitations, edema.  GASTROINTESTINAL: Denies nausea, vomiting, diarrhea, abdominal pain.  GENITOURINARY: Denies dysuria, hematuria.  ENDOCRINE: Denies nocturia or thyroid problems. HEMATOLOGIC AND LYMPHATIC: Denies easy bruising or bleeding.  SKIN: Erythematous rash posterior right leg no further lesions.  MUSCULOSKELETAL: Denies pain in neck, back, shoulder, knees, hips, or further arthritic symptoms.  NEUROLOGIC: Denies paralysis, paresthesias.  PSYCHIATRIC: Denies anxiety or depressive symptoms. Otherwise full review of systems performed by me is negative.   MEDICATIONS AT HOME:   Prior to Admission medications   Medication Sig Start Date  End Date Taking? Authorizing Provider  acetaminophen (TYLENOL) 325 MG tablet Take 650 mg by mouth every 6 (six) hours as needed.    Historical Provider, MD  atorvastatin (LIPITOR) 20 MG tablet TAKE ONE TABLET BY MOUTH ONE TIME DAILY  09/02/14   [name], MD  buPROPion (WELLBUTRIN XL) 300 MG 24 hr tablet Take 300 mg by mouth daily.    Historical Provider, MD  doxycycline (VIBRA-TABS) 100 MG tablet Take 1 tablet (100 mg total) by mouth 2 (two) times daily. 05/26/15   Jackolyn Confer, MD  EPINEPHrine (EPIPEN 2-PAK) 0.3 mg/0.3 mL SOAJ injection Inject 0.3 mLs (0.3 mg  total) into the muscle once. For severe allergic reaction 08/25/13   [name], MD  fluticasone [facility]) 50 MCG/ACT nasal spray USE TWO SPRAYS IN [facility] NOSTRIL DAILY  11/23/14   [name], MD  furosemide (LASIX) 20 MG tablet Take 1 tablet (20 mg total) by mouth daily as needed. 02/14/12   [name], MD  gentamicin ointment (GARAMYCIN) 0.1 % Apply 1 application topically 3 (three) times daily. 05/26/15   Jackolyn Confer, MD  glipiZIDE (GLUCOTROL) 5 MG tablet TAKE ONE TABLET BY MOUTH TWICE DAILY BEFORE MEALS  03/22/15   [name], MD  hydrochlorothiazide (MICROZIDE) 12.5 MG capsule TAKE ONE CAPSULE BY MOUTH ONE TIME DAILY 04/26/15   [name], MD  HYDROcodone-acetaminophen (NORCO) 10-325 MG per tablet Take 1 tablet by mouth every 6 (six) hours as needed.    Historical Provider, MD  Insulin Syringe-Needle U-100 (B-D INS SYR ULTRAFINE 1CC/31G) 31G X 5/16" 1 ML MISC 1 Syringe by Does not apply route daily. 10/13/13   [name], MD  lamoTRIgine (LAMICTAL) 150 MG tablet Take 150 mg by mouth daily.    Historical Provider, MD  Lancet Device MISC Test blood sugar two times daily 07/29/12   [name], MD  lisinopril (PRINIVIL,ZESTRIL) 5 MG tablet Take 1 tablet (5 mg total) by mouth daily. 08/07/14   [name], MD  meclizine (ANTIVERT) 25 MG tablet Take 1 tablet (25 mg total) by mouth 3 (three) times daily as needed. 02/14/12   [name], MD  methocarbamol (ROBAXIN) 500 MG tablet TAKE ONE TABLET BY MOUTH FOUR TIMES DAILY  08/22/13   [name], MD  NOVOLIN N 100 UNIT/ML injection inject 55 units into the skin at bedtime 01/11/15   [name], MD  nystatin (MYCOSTATIN/NYSTOP) 100000 UNIT/GM POWD Apply to affected area  twice a day 03/22/15   [name], MD  OnabotulinumtoxinA (BOTOX IJ) Inject as directed every 3 (three) months. For migraine    Historical Provider, MD  [facility] VERIO test strip TEST BLOOD SUGAR TWICE DAILY  07/02/14   [name], MD  potassium  chloride (MICRO-K) 10 MEQ CR capsule TAKE TWO CAPSULES BY MOUTH DAILY 02/24/13   [name], MD  predniSONE (DELTASONE) 20 MG tablet 3 tablets daily x 3 days,  Then decrease by 1 tablet daily until gone  (5 days total) 05/28/15   [name], MD  pregabalin (LYRICA) 50 MG capsule Take 50 mg by mouth as needed.    Historical Provider, MD  Prenatal Vit-Fe Fumarate-FA (PRENATAL MULTIVITAMIN) TABS Take 1 tablet by mouth daily.    Historical Provider, MD  promethazine (PHENERGAN) 25 MG tablet Take 1 tablet (25 mg total) by mouth every 8 (eight) hours as needed. 05/28/15   [name], MD  Saxagliptin-Metformin (KOMBIGLYZE XR)  2.04-999 MG TB24 take 1 tablet by mouth one time a day may increase to 2 tablets by mouth once a day after 1 to 2 weeks if needed 08/31/14   [name], MD  sulfamethoxazole-trimethoprim (BACTRIM DS,SEPTRA DS) 800-160 MG per tablet Take 1 tablet by mouth 2 (two) times daily. 05/28/15   [name], MD  SUMAtriptan (IMITREX) 100 MG tablet TAKE ONE TABLET BY MOUTH DAILY AS NEEDED 05/12/15   [name], MD  traMADol Veatrice Bourbon) 50 MG tablet Take one tablet by mouth every 6 hours  as needed for relief from pain 07/29/14   [name], MD  VENTOLIN HFA 108 (90 BASE) MCG/ACT inhaler INHALE ONE PUFF BY MOUTH EVERY SIX HOURS AS NEEDED FOR WHEEZING     [name], MD      VITAL SIGNS:  Blood pressure 127/94, pulse 90, temperature 99 F (37.2 C), temperature source Oral, resp. rate 24, height 5\' 4"  (1.626 m), weight 242 lb (109.77 kg), last menstrual period 05/11/2015, SpO2 97 %.  PHYSICAL EXAMINATION:  VITAL SIGNS: Filed Vitals:   05/29/15 1942  BP: 127/94  Pulse: 90  Temp: 99 F (37.2 C)  Resp: 24   GENERAL:43 y.o.female currently in no acute distress.  HEAD: Normocephalic, atraumatic.  EYES: Pupils equal, round, reactive to light. Extraocular muscles intact. No scleral icterus.  MOUTH: Moist mucosal membrane. Dentition intact. No abscess noted.  EAR, NOSE,  THROAT: Clear without exudates. No external lesions.  NECK: Supple. No thyromegaly. No nodules. No JVD.  PULMONARY: Clear to ascultation, without wheeze rails or rhonci. No use of accessory muscles, Good respiratory effort. good air entry bilaterally CHEST: Nontender to palpation.  CARDIOVASCULAR: S1 and S2. Regular rate and rhythm. No murmurs, rubs, or gallops. No edema. Pedal pulses 2+ bilaterally.  GASTROINTESTINAL: Soft, nontender, nondistended. No masses. Positive bowel sounds. No hepatosplenomegaly.  MUSCULOSKELETAL: No swelling, clubbing, or edema. Range of motion full in all extremities.  NEUROLOGIC: Cranial nerves II through XII are intact. No gross focal neurological deficits. Sensation intact. Reflexes intact.  SKIN: Erythematous nonblanching rash posterior right leg warm to touch, No further ulceration, lesions, rashes, or cyanosis. Skin warm and dry. Turgor intact.  PSYCHIATRIC: Mood, affect within normal limits. The patient is awake, alert and oriented x 3. Insight, judgment intact.    LABORATORY PANEL:   CBC  Recent Labs Lab 05/29/15 1708  WBC 2.9*  HGB 12.9  HCT 41.6  PLT 427   ------------------------------------------------------------------------------------------------------------------  Chemistries   Recent Labs Lab 05/29/15 1708  NA 128*  K 5.1  CL 97*  CO2 22  GLUCOSE 704*  BUN 31*  CREATININE 1.29*  CALCIUM 8.9  AST 14*  ALT 16  ALKPHOS 97  BILITOT 0.7   ------------------------------------------------------------------------------------------------------------------  Cardiac Enzymes No results for input(s): TROPONINI in the last 168 hours. ------------------------------------------------------------------------------------------------------------------  RADIOLOGY:  No results found.  EKG:   Orders placed or performed in visit on 08/12/13  . Holter monitor - 48 hour    IMPRESSION AND PLAN:   44 year old Caucasian female history of  requiring diabetes presenting with cellulitis of the right lower extremity. Failed outpatient treatment with doxycycline/topical gentamicin/steroid-dependent.  1.Sepsis, meeting septic criteria by leukopenia, heart rate, respiratory rate present on arrival. Source right lower extremity cellulitis  Panculture. Broad-spectrum antibiotics including vancomycin and taper antibiotics when culture data returns.  Continue IV fluid hydration to keep mean arterial pressure greater than 65. He may require pressor therapy if blood pressure worsens. We will repeat lactic  acid given the initial is greater than 2.2.  2. Uncontrolled type 2 diabetes: Continue her basal insulin, at insulin sliding scale every 6 hours Accu-Cheks, hold oral agents 3. Essential hypertension: Continue lisinopril 4. Venous thromboembolism prophylactic: Heparin subcutaneous     All the records are reviewed and case discussed with ED provider. Management plans discussed with the patient, family and they are in agreement.  CODE STATUS: Full  TOTAL TIME TAKING CARE OF THIS PATIENT: 35 minutes.    Hower,  Karenann Cai.D on 05/29/2015 at 9:56 PM  Between 7am to 6pm - Pager - [phone]  After 6pm: House Pager: - [phone]  [facility]  Office  [phone]  CC: Primary care physician; [name], MD

## 2015-05-29 NOTE — ED Notes (Signed)
Patient reports diagnosed cellulitis to back of right leg. States she has been being treated by PCP with Doxycyline with no relief of infection. States she is diabetic and has been feeling chills and +nausea as well. First noticed what she thought was insect bite to leg 9-10 days ago.

## 2015-05-29 NOTE — ED Provider Notes (Signed)
[facility] Emergency Department Provider Note  ____________________________________________  Time seen: Approximately 7:56 PM  I have reviewed the triage vital signs and the nursing notes.   HISTORY  Chief Complaint Cellulitis   HPI Maria Bentley is a 44 y.o. female presents to the emergency department for the lightest of the right posterior leg that is not improving with outpatient antibiotics. She has been taking doxycycline for the past 3 or 4 days, but the infection continues to spread.   Past Medical History  Diagnosis Date  . Diabetes mellitus   . Morbid obesity     s/p gastric bypass  . Anxiety   . Headache, common migraine, intractable   . Hyperlipidemia   . Depression 2010  . Anemia   . Rosacea   . Personal hx of gastric bypass 2010  . Vertigo   . Kidney stones     left  . Hypertension     Patient Active Problem List   Diagnosis Date Noted  . Sepsis affecting skin 05/29/2015  . Cellulitis of leg, right 05/26/2015  . Cellulitis 08/30/2014  . Otitis media 05/30/2014  . Otitis externa of right ear 05/30/2014  . Right hip pain 05/30/2014  . Vitamin D deficiency 05/30/2014  . Tachycardia 08/15/2013  . Diabetes mellitus type 2, uncontrolled 08/15/2013  . Palpitations 07/15/2013  . Dyspnea 07/15/2013  . Obesity (BMI 30-39.9) 06/09/2012  . Anxiety   . Headache, common migraine, intractable   . Hyperlipidemia   . Obstructive sleep apnea on CPAP 02/05/2012    Past Surgical History  Procedure Laterality Date  . Gastric bypass    . Dilation and curettage of uterus  2011  . Ovarian cyst removal    . Rotator cuff repair      right    Current Outpatient Rx  Name  Route  Sig  Dispense  Refill  . acetaminophen (TYLENOL) 325 MG tablet   Oral   Take 650 mg by mouth every 6 (six) hours as needed.         Marland Kitchen atorvastatin (LIPITOR) 20 MG tablet      TAKE ONE TABLET BY MOUTH ONE TIME DAILY    90 tablet   2   . buPROPion  (WELLBUTRIN XL) 300 MG 24 hr tablet   Oral   Take 300 mg by mouth daily.         Marland Kitchen doxycycline (VIBRA-TABS) 100 MG tablet   Oral   Take 1 tablet (100 mg total) by mouth 2 (two) times daily.   20 tablet   0   . EPINEPHrine (EPIPEN 2-PAK) 0.3 mg/0.3 mL SOAJ injection   Intramuscular   Inject 0.3 mLs (0.3 mg total) into the muscle once. For severe allergic reaction   1 Device   5   . fluticasone (FLONASE) 50 MCG/ACT nasal spray      USE TWO SPRAYS IN EACH NOSTRIL DAILY    16 g   3   . furosemide (LASIX) 20 MG tablet   Oral   Take 1 tablet (20 mg total) by mouth daily as needed.   90 tablet   3   . gentamicin ointment (GARAMYCIN) 0.1 %   Topical   Apply 1 application topically 3 (three) times daily.   15 g   0   . glipiZIDE (GLUCOTROL) 5 MG tablet      TAKE ONE TABLET BY MOUTH TWICE DAILY BEFORE MEALS    180 tablet   0   . hydrochlorothiazide (  MICROZIDE) 12.5 MG capsule      TAKE ONE CAPSULE BY MOUTH ONE TIME DAILY   30 capsule   1   . HYDROcodone-acetaminophen (NORCO) 10-325 MG per tablet   Oral   Take 1 tablet by mouth every 6 (six) hours as needed.         . Insulin Syringe-Needle U-100 (B-D INS SYR ULTRAFINE 1CC/31G) 31G X 5/16" 1 ML MISC   Does not apply   1 Syringe by Does not apply route daily.   100 each   0   . lamoTRIgine (LAMICTAL) 150 MG tablet   Oral   Take 150 mg by mouth daily.         [name] Device MISC      Test blood sugar two times daily   100 each   6   . lisinopril (PRINIVIL,ZESTRIL) 5 MG tablet   Oral   Take 1 tablet (5 mg total) by mouth daily.   90 tablet   3   . meclizine (ANTIVERT) 25 MG tablet   Oral   Take 1 tablet (25 mg total) by mouth 3 (three) times daily as needed.   90 tablet   6   . methocarbamol (ROBAXIN) 500 MG tablet      TAKE ONE TABLET BY MOUTH FOUR TIMES DAILY    90 tablet   3   . NOVOLIN N 100 UNIT/ML injection      inject 55 units into the skin at bedtime   20 mL   5   . nystatin  (MYCOSTATIN/NYSTOP) 100000 UNIT/GM POWD      Apply to affected area  twice a day   60 g   0   . OnabotulinumtoxinA (BOTOX IJ)   Injection   Inject as directed every 3 (three) months. For migraine         . ONETOUCH VERIO test strip      TEST BLOOD SUGAR TWICE DAILY    50 each   5   . potassium chloride (MICRO-K) 10 MEQ CR capsule      TAKE TWO CAPSULES BY MOUTH DAILY   90 capsule   2   . predniSONE (DELTASONE) 20 MG tablet      3 tablets daily x 3 days,  Then decrease by 1 tablet daily until gone  (5 days total)   12 tablet   0   . pregabalin (LYRICA) 50 MG capsule   Oral   Take 50 mg by mouth as needed.         . Prenatal Vit-Fe Fumarate-FA (PRENATAL MULTIVITAMIN) TABS   Oral   Take 1 tablet by mouth daily.         . promethazine (PHENERGAN) 25 MG tablet   Oral   Take 1 tablet (25 mg total) by mouth every 8 (eight) hours as needed.   20 tablet   3   . Saxagliptin-Metformin (KOMBIGLYZE XR) 2.04-999 MG TB24      take 1 tablet by mouth one time a day may increase to 2 tablets by mouth once a day after 1 to 2 weeks if needed   60 tablet   5   . sulfamethoxazole-trimethoprim (BACTRIM DS,SEPTRA DS) 800-160 MG per tablet   Oral   Take 1 tablet by mouth 2 (two) times daily.   14 tablet   0   . SUMAtriptan (IMITREX) 100 MG tablet      TAKE ONE TABLET BY MOUTH DAILY AS NEEDED   9 tablet  0   . traMADol (ULTRAM) 50 MG tablet      Take one tablet by mouth every 6 hours  as needed for relief from pain   90 tablet   1   . VENTOLIN HFA 108 (90 BASE) MCG/ACT inhaler      INHALE ONE PUFF BY MOUTH EVERY SIX HOURS AS NEEDED FOR WHEEZING    18 g   3     Allergies Avocado; Sulfa drugs cross reactors; Adhesive; Amoxicillin; Clarithromycin; Honey; and Watermelon concentrate  Family History  Problem Relation Age of Onset  . Diabetes Mother   . Hypertension Mother   . Cancer Neg Hx   . Depression Other     strong hx of  . Hypertension Father   .  Diabetes Father     Social History History  Substance Use Topics  . Smoking status: Former Smoker -- 1.00 packs/day for 4 years    Types: Cigarettes    Quit date: 02/05/1996  . Smokeless tobacco: Never Used  . Alcohol Use: Yes     Comment: Once a year.    Review of Systems Constitutional: No fever, positive for chills Eyes: No visual changes. ENT: No sore throat. Cardiovascular: Denies chest pain. Respiratory: Denies shortness of breath. Gastrointestinal: No abdominal pain.  No nausea, no vomiting.  No diarrhea.  No constipation. Genitourinary: Negative for dysuria. Musculoskeletal: Negative for back pain. Skin: Skin infection behind the right knee that is worsening despite antibiotics and prednisone.  Neurological: Negative for headaches, focal weakness or numbness.  10-point ROS otherwise negative.  ____________________________________________   PHYSICAL EXAM:  VITAL SIGNS: ED Triage Vitals  Enc Vitals Group     BP 05/29/15 1700 132/89 mmHg     Pulse Rate 05/29/15 1700 98     Resp 05/29/15 1700 20     Temp 05/29/15 1700 98.1 F (36.7 C)     Temp Source 05/29/15 1700 Oral     SpO2 05/29/15 1700 98 %     Weight 05/29/15 1700 242 lb (109.77 kg)     Height 05/29/15 1700 5\' 4"  (1.626 m)     Head Cir --      Peak Flow --      Pain Score 05/29/15 1701 2     Pain Loc --      Pain Edu? --      Excl. in [place]? --     Constitutional: Alert and oriented. Well appearing and in no acute distress. Eyes: Conjunctivae are normal. PERRL. EOMI. Head: Atraumatic. Nose: No congestion/rhinnorhea. Mouth/Throat: Mucous membranes are moist.  Oropharynx non-erythematous. Neck: No stridor.   Cardiovascular: Normal rate, regular rhythm. Grossly normal heart sounds.  Good peripheral circulation. Respiratory: Normal respiratory effort.  No retractions. Lungs CTAB. Gastrointestinal: Soft and nontender. No distention. No abdominal bruits. No CVA tenderness. Musculoskeletal: No lower  extremity tenderness nor edema.  No joint effusions. Neurologic:  Normal speech and language. No gross focal neurologic deficits are appreciated. Speech is normal. No gait instability. Skin:  Erythematous, weeping area of cellulitis behind right knee that has extended past the previous markings. Psychiatric: Mood and affect are normal. Speech and behavior are normal.  ____________________________________________   LABS (all labs ordered are listed, but only abnormal results are displayed)  Labs Reviewed  CBC WITH DIFFERENTIAL/PLATELET - Abnormal; Notable for the following:    WBC 2.9 (*)    MCHC 31.1 (*)    RDW 16.0 (*)    All other components within normal limits  COMPREHENSIVE  METABOLIC PANEL - Abnormal; Notable for the following:    Sodium 128 (*)    Chloride 97 (*)    Glucose, Bld 704 (*)    BUN 31 (*)    Creatinine, Ser 1.29 (*)    AST 14 (*)    GFR calc non Af Amer 50 (*)    GFR calc Af Amer 57 (*)    All other components within normal limits  GLUCOSE, CAPILLARY - Abnormal; Notable for the following:    Glucose-Capillary 501 (*)    All other components within normal limits  CULTURE, BLOOD (ROUTINE X 2)  CULTURE, BLOOD (ROUTINE X 2)  URINALYSIS COMPLETEWITH MICROSCOPIC ([facility] ONLY)  CBC  CREATININE, SERUM  CBG MONITORING, ED  CBG MONITORING, ED  CBG MONITORING, ED  CBG MONITORING, ED   ____________________________________________  EKG   ____________________________________________  RADIOLOGY   ____________________________________________   PROCEDURES  Procedure(s) performed: None  Critical Care performed: No  ____________________________________________   INITIAL IMPRESSION / ASSESSMENT AND PLAN / ED COURSE  Pertinent labs & imaging results that were available during my care of the patient were reviewed by me and considered in my medical decision making (see chart for details).  Patient to be admitted for IV antibiotics and management of  hyperglycemia. ____________________________________________   FINAL CLINICAL IMPRESSION(S) / ED DIAGNOSES  Final diagnoses:  Hyperglycemia  Cellulitis of right lower extremity      Victorino Dike, FNP 05/29/15 2146  Carrie Mew, MD 05/30/15 0005

## 2015-05-30 LAB — GLUCOSE, CAPILLARY
GLUCOSE-CAPILLARY: 429 mg/dL — AB (ref 65–99)
GLUCOSE-CAPILLARY: 257 mg/dL — AB (ref 65–99)
GLUCOSE-CAPILLARY: 357 mg/dL — AB (ref 65–99)
Glucose-Capillary: 364 mg/dL — ABNORMAL HIGH (ref 65–99)

## 2015-05-30 MED ORDER — INSULIN ASPART 100 UNIT/ML ~~LOC~~ SOLN
12.0000 [IU] | Freq: Once | SUBCUTANEOUS | Status: AC
  Administered 2015-05-30: 12 [IU] via SUBCUTANEOUS
  Filled 2015-05-30: qty 12

## 2015-05-30 MED ORDER — VANCOMYCIN HCL IN DEXTROSE 1-5 GM/200ML-% IV SOLN
1000.0000 mg | Freq: Two times a day (BID) | INTRAVENOUS | Status: DC
  Administered 2015-05-30 – 2015-05-31 (×4): 1000 mg via INTRAVENOUS
  Filled 2015-05-30 (×8): qty 200

## 2015-05-30 MED ORDER — INSULIN ASPART PROT & ASPART (70-30 MIX) 100 UNIT/ML ~~LOC~~ SUSP
55.0000 [IU] | Freq: Every day | SUBCUTANEOUS | Status: DC
  Administered 2015-05-30: 55 [IU] via SUBCUTANEOUS
  Filled 2015-05-30: qty 550

## 2015-05-30 MED ORDER — INSULIN NPH (HUMAN) (ISOPHANE) 100 UNIT/ML ~~LOC~~ SUSP
55.0000 [IU] | Freq: Every day | SUBCUTANEOUS | Status: DC
  Filled 2015-05-30: qty 10

## 2015-05-30 NOTE — Progress Notes (Signed)
Notified Dr Bridgett Larsson of  Pt [place]; Dr acknowledged, stated would put in orders

## 2015-05-30 NOTE — Progress Notes (Signed)
  ANTIBIOTIC CONSULT NOTE - INITIAL  Pharmacy Consult for vancomycin dosing Indication: cellulitis/sepsis  Allergies  Allergen Reactions  . Avocado Swelling  . Sulfa Drugs Cross Reactors Anaphylaxis  . Adhesive [Tape]   . Amoxicillin Hives  . Clarithromycin   . Honey   . Other Hives and Swelling    All melon  . Watermelon Concentrate     Patient Measurements: Height: 5\' 4"  (162.6 cm) Weight: 245 lb 11.2 oz (111.449 kg) IBW/kg (Calculated) : 54.7 Adjusted Body Weight: 77 kg  Vital Signs: Temp: 98 F (36.7 C) (06/18 2318) Temp Source: Oral (06/18 2318) BP: 140/68 mmHg (06/18 2318) Pulse Rate: 78 (06/18 2318) Intake/Output from previous day: 06/18 0701 - 06/19 0700 In: -  Out: 400 [Urine:400] Intake/Output from this shift: Total I/O In: -  Out: 400 [Urine:400]  Labs:  Recent Labs  05/28/15 0838 05/28/15 1452 05/29/15 1708  WBC  --  8.6 2.9*  HGB  --  12.1 12.9  PLT  --  484.0* 427  CREATININE 1.07  --  1.29*   Estimated Creatinine Clearance: 68 mL/min (by C-G formula based on Cr of 1.29). No results for input(s): VANCOTROUGH, VANCOPEAK, VANCORANDOM, GENTTROUGH, GENTPEAK, GENTRANDOM, TOBRATROUGH, TOBRAPEAK, TOBRARND, AMIKACINPEAK, AMIKACINTROU, AMIKACIN in the last 72 hours.   Microbiology: Recent Results (from the past 720 hour(s))  Wound culture     Status: None (Preliminary result)   Collection Time: 05/28/15  3:01 PM  Result Value Ref Range Status   Gram Stain No WBC Seen  Preliminary   Gram Stain No Squamous Epithelial Cells Seen  Preliminary   Gram Stain No Organisms Seen  Preliminary   Preliminary Report NO GROWTH 1 DAY  Preliminary    Medical History: Past Medical History  Diagnosis Date  . Diabetes mellitus   . Morbid obesity     s/p gastric bypass  . Anxiety   . Headache, common migraine, intractable   . Hyperlipidemia   . Depression 2010  . Anemia   . Rosacea   . Personal hx of gastric bypass 2010  . Vertigo   . Kidney stones    left  . Hypertension     Medications:   Assessment: Blood and wound cx pending  Goal of Therapy:  Vancomycin trough level 15-20 mcg/ml  Plan:  TBW 111.4 kg  IBW 54.7kg  DW 77kg  Vd 54L kei 0.061 hr-1  T1/2 11 hours 1 gram q 12 hours ordered with stacked dosing. Level before 5th dose.  Editha Bridgeforth S 05/30/2015,4:52 AM

## 2015-05-30 NOTE — Assessment & Plan Note (Signed)
Given her multiple drug allergies,  cephalossporin and beta lactams are not an option. continue doxycycline,  Adding prednisone taper  Given the pruritic compoenent, suggesting contact dermatitis as the initial cause.

## 2015-05-30 NOTE — Progress Notes (Addendum)
[facility] [place] NAME: Maria Bentley    MR#:  [id number]  DATE OF BIRTH:  25-Feb-1971  SUBJECTIVE:  CHIEF COMPLAINT:   Chief Complaint  Patient presents with  . Cellulitis    REVIEW OF SYSTEMS:  CONSTITUTIONAL: No fever, fatigue or weakness.  EYES: No blurred or double vision.  EARS, NOSE, AND THROAT: No tinnitus or ear pain.  RESPIRATORY: No cough, shortness of breath, wheezing or hemoptysis.  CARDIOVASCULAR: No chest pain, orthopnea, edema.  GASTROINTESTINAL: No nausea, vomiting, diarrhea or abdominal pain.  GENITOURINARY: No dysuria, hematuria.  ENDOCRINE: No polyuria, nocturia,  HEMATOLOGY: No anemia, easy bruising or bleeding SKIN: No rash or lesion. Right leg erythema and tenderness is better. MUSCULOSKELETAL: No joint pain or arthritis.   NEUROLOGIC: No tingling, numbness, weakness.  PSYCHIATRY: No anxiety or depression.   DRUG ALLERGIES:   Allergies  Allergen Reactions  . Avocado Swelling  . Sulfa Drugs Cross Reactors Anaphylaxis  . Adhesive [Tape]   . Amoxicillin Hives  . Clarithromycin   . Honey   . Other Hives and Swelling    All melon  . Watermelon Concentrate     VITALS:  Blood pressure 106/74, pulse 70, temperature 97.5 F (36.4 C), temperature source Oral, resp. rate 16, height 5\' 4"  (1.626 m), weight 111.449 kg (245 lb 11.2 oz), last menstrual period 05/11/2015, SpO2 97 %.  PHYSICAL EXAMINATION:  GENERAL:  43 y.o.-year-old patient lying in the bed with no acute distress. Obesity. EYES: Pupils equal, round, reactive to light and accommodation. No scleral icterus. Extraocular muscles intact.  HEENT: Head atraumatic, normocephalic. Oropharynx and nasopharynx clear.  NECK:  Supple, no jugular venous distention. No thyroid enlargement, no tenderness.  LUNGS: Normal breath sounds bilaterally, no wheezing, rales,rhonchi or crepitation. No use of accessory muscles of respiration.  CARDIOVASCULAR: S1,  S2 normal. No murmurs, rubs, or gallops.  ABDOMEN: Soft, nontender, nondistended. Bowel sounds present. No organomegaly or mass.  EXTREMITIES: No pedal edema, cyanosis, or clubbing. Erythematous nonblanching rash posterior right leg with tenderness. NEUROLOGIC: Cranial nerves II through XII are intact. Muscle strength 5/5 in all extremities. Sensation intact. Gait not checked.  PSYCHIATRIC: The patient is alert and oriented x 3.  SKIN: No obvious rash, lesion, or ulcer. Erythematous nonblanching rash posterior right leg.   LABORATORY PANEL:   CBC  Recent Labs Lab 05/29/15 1708  WBC 2.9*  HGB 12.9  HCT 41.6  PLT 427   ------------------------------------------------------------------------------------------------------------------  Chemistries   Recent Labs Lab 05/29/15 1708  NA 128*  K 5.1  CL 97*  CO2 22  GLUCOSE 704*  BUN 31*  CREATININE 1.29*  CALCIUM 8.9  AST 14*  ALT 16  ALKPHOS 97  BILITOT 0.7   ------------------------------------------------------------------------------------------------------------------  Cardiac Enzymes No results for input(s): TROPONINI in the last 168 hours. ------------------------------------------------------------------------------------------------------------------  RADIOLOGY:  No results found.  EKG:   Orders placed or performed in visit on 08/12/13  . Holter monitor - 48 hour    ASSESSMENT AND PLAN:   1.Sepsis,with right lower extremity cellulitis, leukopenia. Cont vancomycin and follow-up CBC, blood culture and wound culture.  2. Hyperglycemia with Uncontrolled type 2 diabetes: Hemoglobin A1c 12. Blood sugar was more than 400 this morning.  Resume her NovoLog and 55 units subcutaneous at bedtime and continue insulin sliding scale, hold oral agents and get endocrinology consult.  3. Essential hypertension: Continue lisinopril  4. Dehydration. Continue normal saline IV and follow-up BMP.  5. Hyperlipidemia. On  Lipitor.  All the records are reviewed and case discussed with Care Management/Social Workerr. Management plans discussed with the patient, family and they are in agreement.  CODE STATUS: Full code   TOTAL TIME TAKING CARE OF THIS PATIENT: 38 minutes.   POSSIBLE D/C IN 2 DAYS, DEPENDING ON CLINICAL CONDITION.   Demetrios Loll M.D on 05/30/2015 at 11:10 AM  Between 7am to 6pm - Pager - [phone]  After 6pm go to [email or web address] - password [email or web address] [facility]  Office  [phone]  CC: Primary care physician; [name], MD

## 2015-05-31 ENCOUNTER — Ambulatory Visit: Payer: BC Managed Care – PPO | Admitting: Internal Medicine

## 2015-05-31 LAB — BASIC METABOLIC PANEL
Anion gap: 3 — ABNORMAL LOW (ref 5–15)
BUN: 17 mg/dL (ref 6–20)
CO2: 27 mmol/L (ref 22–32)
CREATININE: 1 mg/dL (ref 0.44–1.00)
Calcium: 8.3 mg/dL — ABNORMAL LOW (ref 8.9–10.3)
Chloride: 106 mmol/L (ref 101–111)
GFR calc Af Amer: >60 mL/min (ref >60)
GFR calc non Af Amer: >60 mL/min (ref >60)
GLUCOSE: 213 mg/dL — AB (ref 65–99)
Potassium: 4 mmol/L (ref 3.5–5.1)
Sodium: 136 mmol/L (ref 135–145)

## 2015-05-31 LAB — GLUCOSE, CAPILLARY
GLUCOSE-CAPILLARY: 218 mg/dL — AB (ref 65–99)
Glucose-Capillary: 292 mg/dL — ABNORMAL HIGH (ref 65–99)
Glucose-Capillary: 302 mg/dL — ABNORMAL HIGH (ref 65–99)
Glucose-Capillary: 216 mg/dL — ABNORMAL HIGH (ref 65–99)

## 2015-05-31 LAB — CBC
HCT: 35.1 % (ref 35.0–47.0)
Hemoglobin: 10.9 g/dL — ABNORMAL LOW (ref 12.0–16.0)
MCH: 26.1 pg (ref 26.0–34.0)
MCHC: 31.1 g/dL — ABNORMAL LOW (ref 32.0–36.0)
MCV: 83.9 fL (ref 80.0–100.0)
PLATELETS: 375 10*3/uL (ref 150–440)
RBC: 4.18 MIL/uL (ref 3.80–5.20)
RDW: 16.4 % — ABNORMAL HIGH (ref 11.5–14.5)
WBC: 6.7 10*3/uL (ref 3.6–11.0)

## 2015-05-31 LAB — VANCOMYCIN, TROUGH: Vancomycin Tr: 16 ug/mL (ref 10–20)

## 2015-05-31 LAB — WOUND CULTURE
GRAM STAIN: NONE SEEN
GRAM STAIN: NONE SEEN
Gram Stain: NONE SEEN
Organism ID, Bacteria: NO GROWTH

## 2015-05-31 MED ORDER — INSULIN ASPART PROT & ASPART (70-30 MIX) 100 UNIT/ML ~~LOC~~ SUSP: 60.0000 [IU] | Freq: Every day | SUBCUTANEOUS | Status: DC

## 2015-05-31 MED ORDER — METHOCARBAMOL 500 MG PO TABS
500.0000 mg | ORAL_TABLET | Freq: Four times a day (QID) | ORAL | Status: DC | PRN
  Administered 2015-06-01 – 2015-06-03 (×4): 500 mg via ORAL
  Filled 2015-05-31 (×4): qty 1

## 2015-05-31 MED ORDER — INSULIN ASPART 100 UNIT/ML ~~LOC~~ SOLN
12.0000 [IU] | Freq: Three times a day (TID) | SUBCUTANEOUS | Status: DC
  Administered 2015-05-31 – 2015-06-01 (×3): 12 [IU] via SUBCUTANEOUS
  Filled 2015-05-31 (×3): qty 12

## 2015-05-31 MED ORDER — INSULIN GLARGINE 100 UNIT/ML ~~LOC~~ SOLN
55.0000 [IU] | Freq: Every day | SUBCUTANEOUS | Status: DC
  Administered 2015-05-31: 55 [IU] via SUBCUTANEOUS
  Filled 2015-05-31 (×2): qty 0.55

## 2015-05-31 MED ORDER — LAMOTRIGINE 100 MG PO TABS
100.0000 mg | ORAL_TABLET | Freq: Every day | ORAL | Status: DC
  Administered 2015-06-01 – 2015-06-04 (×4): 100 mg via ORAL
  Filled 2015-05-31 (×4): qty 1

## 2015-05-31 MED FILL — Insulin Aspart Prot & Aspart (Human) Inj 100 Unit/ML (70-30): SUBCUTANEOUS | Qty: 0.55 | Status: AC

## 2015-05-31 NOTE — Consult Note (Signed)
ENDOCRINOLOGY CONSULTATION  REFERRING PHYSICIAN: Demetrios Loll, MD CONSULTING PHYSICIAN:  A. Lavone Orn, MD.  CHIEF COMPLAINT:  Diabetic mellitus  HISTORY OF PRESENT ILLNESS:  44 y.o. female seen in consultation for diabetes mellitus. She was hospitalized yesterday for rt lower ext cellulitis after failing out-patient antibiotics. Was started on Prednisone by primary care physician on 6/17. She has hyperglycemia with blood sugars in the 200-300 range.  She has a 18 yr h/o type 1 diabetes. Outpatient regimen includes Novolin N 55 units qhs, Kombiglyze 2.04/999- 1 tablet bid and glipizide 5 mg bid. Last Hgb A1c on 05/28/15 was 12%. Diabetes severely uncontrolled. She does not check her blood sugar. She has no known complications from diabetes. She denies blurred vision, polyuria and polydipsia.  She is morbidly obese. She underwent gastric bypass in 2004. Reports a 50 lb weight gain in last 18 months. Cites stress associated with illness of family members as a cause of her weight gain.   PAST MEDICAL HISTORY:  Past Medical History  Diagnosis Date  . Diabetes mellitus   . Morbid obesity     s/p gastric bypass  . Anxiety   . Headache, common migraine, intractable   . Hyperlipidemia   . Depression 2010  . Anemia   . Rosacea   . Personal hx of gastric bypass 2010  . Vertigo   . Kidney stones     left  . Hypertension      CURRENT MEDICATIONS:  . atorvastatin  20 mg Oral Daily  . buPROPion  300 mg Oral Daily  . fluticasone  2 spray Each Nare Daily  . heparin  5,000 Units Subcutaneous 3 times per day  . NovoLog (insulin aspart) sliding scale  0-15 Units Subcutaneous TID WC   vancomycin 1000 mg IV q12 hrs  . pregabalin 50 mg  Oral daily  . insulin glargine  55 Units Subcutaneous QHS  . lamoTRIgine  150 mg Oral Daily  . lisinopril  5 mg Oral Daily  . methocarbamol  500 mg Oral QID    SOCIAL HISTORY:  History  Substance Use Topics  . Smoking status: Former Smoker -- 1.00  packs/day for 4 years    Types: Cigarettes    Quit date: 02/05/1996  . Smokeless tobacco: Never Used  . Alcohol Use: Yes     Comment: Once a year.     FAMILY HISTORY:   Family History  Problem Relation Age of Onset  . Diabetes Mother   . Hypertension Mother   . Cancer Neg Hx   . Depression Other     strong hx of  . Hypertension Father   . Diabetes Father      ALLERGIES:  Allergies  Allergen Reactions  . Avocado Swelling  . Sulfa Drugs Cross Reactors Anaphylaxis  . Adhesive [Tape]   . Amoxicillin Hives  . Clarithromycin   . Honey   . Other Hives and Swelling    All melon  . Watermelon Concentrate     REVIEW OF SYSTEMS:  GENERAL:  No weight loss.  No fever.  HEENT:  No blurred vision. No sore throat.  NECK:  No neck pain or dysphagia.  CARDIAC:  No chest pain or palpitation.  PULMONARY:  No cough or shortness of breath.  ABDOMEN:  No abdominal pain.  No constipation. EXTREMITIES:  No lower extremity swelling.  ENDOCRINE:  No heat or cold intolerance.  GENITOURINARY:  No dysuria or hematuria. SKIN:  No recent rash or skin changes.    PHYSICAL  EXAMINATION:  BP 104/71 mmHg  Pulse 80  Temp(Src) 98.3 F (36.8 C) (Oral)  Resp 16  Ht 5\' 4"  (1.626 m)  Wt 111.449 kg (245 lb 11.2 oz)  BMI 42.15 kg/m2  SpO2 94%  LMP 05/11/2015  GENERAL: Morbidly obese white  female in NAD. HEENT:  EOMI.  Oropharynx is clear.  NECK:  Supple.  No thyromegaly.  No neck tenderness.  CARDIAC:  Regular rate and rhythm without murmur.  PULMONARY:  Clear to auscultation bilaterally.  ABDOMEN:  Diffusely soft, non-tender, non-distended.  EXTREMITIES:  No peripheral edema is present.    SKIN:  Erythremic rash to the rt posterior knee region extending superior - inferior approx 10 cm. NEUROLOGIC:  No dysarthria.  No tremor. PSYCHIATRIC:  Alert and oriented, calm, cooperative.   LABORATORY DATA:  Results for orders placed or performed during the hospital encounter of 05/29/15 (from the  past 24 hour(s))  Glucose, capillary     Status: Abnormal   Collection Time: 05/30/15  4:55 PM  Result Value Ref Range   Glucose-Capillary 257 (H) 65 - 99 mg/dL  Glucose, capillary     Status: Abnormal   Collection Time: 05/30/15 10:08 PM  Result Value Ref Range   Glucose-Capillary 357 (H) 65 - 99 mg/dL   Comment 1 Notify RN   Basic metabolic panel     Status: Abnormal   Collection Time: 05/31/15  4:44 AM  Result Value Ref Range   Sodium 136 135 - 145 mmol/L   Potassium 4.0 3.5 - 5.1 mmol/L   Chloride 106 101 - 111 mmol/L   CO2 27 22 - 32 mmol/L   Glucose, Bld 213 (H) 65 - 99 mg/dL   BUN 17 6 - 20 mg/dL   Creatinine, Ser 1.00 0.44 - 1.00 mg/dL   Calcium 8.3 (L) 8.9 - 10.3 mg/dL   GFR calc non Af Amer >60 >60 mL/min   GFR calc Af Amer >60 >60 mL/min   Anion gap 3 (L) 5 - 15  CBC     Status: Abnormal   Collection Time: 05/31/15  4:44 AM  Result Value Ref Range   WBC 6.7 3.6 - 11.0 K/uL   RBC 4.18 3.80 - 5.20 MIL/uL   Hemoglobin 10.9 (L) 12.0 - 16.0 g/dL   HCT 35.1 35.0 - 47.0 %   MCV 83.9 80.0 - 100.0 fL   MCH 26.1 26.0 - 34.0 pg   MCHC 31.1 (L) 32.0 - 36.0 g/dL   RDW 16.4 (H) 11.5 - 14.5 %   Platelets 375 150 - 440 K/uL  Glucose, capillary     Status: Abnormal   Collection Time: 05/31/15  7:46 AM  Result Value Ref Range   Glucose-Capillary 218 (H) 65 - 99 mg/dL   Comment 1 Notify RN   Glucose, capillary     Status: Abnormal   Collection Time: 05/31/15 11:35 AM  Result Value Ref Range   Glucose-Capillary 292 (H) 65 - 99 mg/dL   Comment 1 Notify RN     ASSESSMENT:  1. Diabetes mellitus, type 2 uncontrolled 2.   Cellulitis 3.   Receiving systemic high dose steroids 4.   Morbid obesity  PLAN: 1. During in-patient stay, will aim for sugars in the 80-180 range.  2. Follow diabetic diet. 3. Appreciate assistance of diabetes coordinator team. 4. Continue Lantus 55 units qhs. 5. Continue NovoLog SSI qACHS as ordered. 6. Add mealtime scheduled NovoLog 12 units tid  AC.  If patient is discharged before I re-evaluate,  then please discharge on the following medications for diabetes. Note that she will need a prescription for the medication with the ** as noted. Novolin N 100 units/ml VIAL 55 units qhs Kombiglyze 2.04/999 bid  glipizide 5 mg bid NovoLog Flex pen 12 units three times daily before meals (15 ml rf0) ** RXN NEEDED Insulin pen needles for use three times daily before meals (#100 rf0) ** RXN NEEDED  Will follow along with you.  Thank you for the kind request for consultation.

## 2015-05-31 NOTE — Progress Notes (Signed)
Inpatient Diabetes Program Recommendations  AACE/ADA: New Consensus Statement on Inpatient Glycemic Control (2013)  Target Ranges:  Prepandial:   less than 140 mg/dL      Peak postprandial:   less than 180 mg/dL (1-2 hours)      Critically ill patients:  140 - 180 mg/dL     Results for Maria Bentley, Maria Bentley (MRN [id number]) as of 05/31/2015 08:36  Ref. Range 05/30/2015 07:48 05/30/2015 11:37 05/30/2015 16:55 05/30/2015 22:08  Glucose-Capillary Latest Ref Range: 65-99 mg/dL 429 (H) 364 (H) 257 (H) 357 (H)    Results for Maria Bentley, Maria Bentley (MRN [id number]) as of 05/31/2015 08:36  Ref. Range 08/28/2014 15:55 05/28/2015 08:38  Hemoglobin A1C Latest Ref Range: 4.6-6.5 % 8.3 (H) 12.0 (H)     Admit with: R LE Cellulitis  History: DM, HTN  Home DM Meds: NPH insulin- 55 units QHS       Glipizide 5 mg bid       Kombiglyze 2.04/999- 1 tablet bid  Current DM Orders: Novolog 70/30 Mix insulin- 55 units QHS            Novolog Moderate SSI (0-15 units) tid ac + HS (per Glycemic Control Order set)    **Patient having significant hyperglycemia.  Note that Hospitalist has consulted Endocrinology.  **Patient eating 100% of meals.  **PCP: Dr. Deborra Medina with [facility].  **Will speak with patient today about her elevated A1c of 12%.    MD- Please consider the following in-hospital insulin adjustments until patient is seen by Endocrinology:  1. Stop Novolog 70/30 Mix- 55 units QHS (patient takes NPH insulin at home- Not Novolog 70/30 Mix.  Not safe to give 70/30 insulin at bedtime because this dose contains ~16 units of Novolog)  2. Start patient's home dose of NPH insulin- 55 units QHS  3. Patient may need a dose of NPH insulin in the AM as well for better basal coverage.  4. Consider adding Novolog Meal Coverage as well- Novolog 4 units tid with meals to start [Use Glycemic Control Order set to order Novolog Meal Coverage]    Will follow Wyn Quaker RN, MSN,  CDE Diabetes Coordinator Inpatient Glycemic Control Team Team Pager: [phone] (8a-5p)

## 2015-05-31 NOTE — Progress Notes (Signed)
[facility] [place] NAME: Maria Bentley    MR#:  [id number]  DATE OF BIRTH:  04-03-1971  SUBJECTIVE:  CHIEF COMPLAINT:   Chief Complaint  Patient presents with  . Cellulitis   right leg cellulitis is slightly better.  REVIEW OF SYSTEMS:  CONSTITUTIONAL: No fever, fatigue or weakness.  EYES: No blurred or double vision.  EARS, NOSE, AND THROAT: No tinnitus or ear pain.  RESPIRATORY: No cough, shortness of breath, wheezing or hemoptysis.  CARDIOVASCULAR: No chest pain, orthopnea, edema.  GASTROINTESTINAL: No nausea, vomiting, diarrhea or abdominal pain.  GENITOURINARY: No dysuria, hematuria.  ENDOCRINE: No polyuria, nocturia,  HEMATOLOGY: No anemia, easy bruising or bleeding SKIN: No rash or lesion. Right leg erythema and tenderness is better. MUSCULOSKELETAL: No joint pain or arthritis.   NEUROLOGIC: No tingling, numbness, weakness.  PSYCHIATRY: No anxiety or depression.   DRUG ALLERGIES:   Allergies  Allergen Reactions  . Avocado Swelling  . Sulfa Drugs Cross Reactors Anaphylaxis  . Adhesive [Tape]   . Amoxicillin Hives  . Clarithromycin   . Honey   . Other Hives and Swelling    All melon  . Watermelon Concentrate     VITALS:  Blood pressure 104/71, pulse 80, temperature 98.3 F (36.8 C), temperature source Oral, resp. rate 16, height 5\' 4"  (1.626 m), weight 111.449 kg (245 lb 11.2 oz), last menstrual period 05/11/2015, SpO2 94 %.  PHYSICAL EXAMINATION:  GENERAL:  44 y.o.-year-old patient lying in the bed with no acute distress. Obesity. EYES: Pupils equal, round, reactive to light and accommodation. No scleral icterus. Extraocular muscles intact.  HEENT: Head atraumatic, normocephalic. Oropharynx and nasopharynx clear.  NECK:  Supple, no jugular venous distention. No thyroid enlargement, no tenderness.  LUNGS: Normal breath sounds bilaterally, no wheezing, rales,rhonchi or crepitation. No use of accessory  muscles of respiration.  CARDIOVASCULAR: S1, S2 normal. No murmurs, rubs, or gallops.  ABDOMEN: Soft, nontender, nondistended. Bowel sounds present. No organomegaly or mass.  EXTREMITIES: No pedal edema, cyanosis, or clubbing. Erythematous nonblanching rash posterior right leg with tenderness. NEUROLOGIC: Cranial nerves II through XII are intact. Muscle strength 5/5 in all extremities. Sensation intact. Gait not checked.  PSYCHIATRIC: The patient is alert and oriented x 3.  SKIN: No obvious rash, lesion, or ulcer. Erythematous nonblanching rash posterior right leg.   LABORATORY PANEL:   CBC  Recent Labs Lab 05/31/15 0444  WBC 6.7  HGB 10.9*  HCT 35.1  PLT 375   ------------------------------------------------------------------------------------------------------------------  Chemistries   Recent Labs Lab 05/29/15 1708 05/31/15 0444  NA 128* 136  K 5.1 4.0  CL 97* 106  CO2 22 27  GLUCOSE 704* 213*  BUN 31* 17  CREATININE 1.29* 1.00  CALCIUM 8.9 8.3*  AST 14*  --   ALT 16  --   ALKPHOS 97  --   BILITOT 0.7  --    ------------------------------------------------------------------------------------------------------------------  Cardiac Enzymes No results for input(s): TROPONINI in the last 168 hours. ------------------------------------------------------------------------------------------------------------------  RADIOLOGY:  No results found.  EKG:   Orders placed or performed in visit on 08/12/13  . Holter monitor - 48 hour    ASSESSMENT AND PLAN:   1.Sepsis,with right lower extremity cellulitis, Cont vancomycin, blood culture and wound culture are negative so far.   leukopenia. Improved  2. Hyperglycemia with Uncontrolled type 2 diabetes: Hemoglobin A1c 12. Blood sugar is more than 200 today.  Per Dr. Gabriel Carina, start Lantus 55 units subcutaneous at bedtime, 8 and NovoLog  twice a units 3 times a day before meals and continue insulin sliding scale, hold  oral agents. Appreciated endocrinology consult.  3. Essential hypertension: Continue lisinopril  4. Dehydration. Improved. discontinue normal saline IV.  5. Hyperlipidemia. On Lipitor.     All the records are reviewed and case discussed with Care Management/Social Workerr. Management plans discussed with the patient, family and they are in agreement.  CODE STATUS: Full code   TOTAL TIME TAKING CARE OF THIS PATIENT: 33 minutes.   POSSIBLE D/C IN 2 DAYS, DEPENDING ON CLINICAL CONDITION.   Demetrios Loll M.D on 05/31/2015 at 2:04 PM  Between 7am to 6pm - Pager - [phone]  After 6pm go to [email or web address] - password [email or web address] [facility]  Office  [phone]  CC: Primary care physician; [name], MD

## 2015-05-31 NOTE — Progress Notes (Addendum)
Inpatient Diabetes Program Recommendations  AACE/ADA: New Consensus Statement on Inpatient Glycemic Control (2013)  Target Ranges:  Prepandial:   less than 140 mg/dL      Peak postprandial:   less than 180 mg/dL (1-2 hours)      Critically ill patients:  140 - 180 mg/dL     Addendum 12PM:    Spoke with patient about her current A1c of 12%.  Patient stated she knows that her A1c is extremely high and that she needs to work on taking better care of herself.  Reminded patient that her goal A1c is 7% or less per ADA standards to prevent both acute and long-term complications.  Explained to patient the extreme importance of good glucose control at home.  Encouraged patient to check her CBGs at least bid at home (fasting and another check within the day) and to record all CBGs in a logbook for her PCP to review.  Patient opened up to me and told me that she has been under a lot of emotional stress lately and has not been taking care of herself.  Takes her DM medicines but does not check her CBGs like she needs to and has not been doing well with her nutrition plan (patient told me she has gained quite a bit of weight).  Patient stated she suffers from depression and is working on getting her emotional issues under control.  Patient also stated she had an appointment with her PCP (Dr. [name]) for further DM management today.  Patient told me she called to cancel that appointment and will reschedule asap after d/c.  I asked patient if she had any questions or concerns about her DM.  Patient stated she did not.  Advised patient that she may need more insulin at home and that she should follow up with Dr. [name] soon after d/c.    Will follow Wyn Quaker RN, MSN, CDE Diabetes Coordinator Inpatient Glycemic Control Team Team Pager: [phone] (8a-5p)

## 2015-05-31 NOTE — Care Management Note (Signed)
Case Management Note  Patient Details  Name: Maria Bentley MRN: [id number] Date of Birth: 09-09-1971  Subjective/Objective:     [name] states that Dr Pennie Rushing [phone])  originally prescribed  Lyrica for her. [facility] on [address] (ph [phone]) reports that her insurance provider, [employer] denied approval for this medication. [facility] also reports that they have submitted two requests for "prior authorization" to Dr Zigmund Daniel. [name] was updated and stated that she will call Dr Zigmund Daniel office herself to request that he go ahead and submit a prior authorization to [facility] for Lyrica..          Action/Plan:   Expected Discharge Date:                  Expected Discharge Plan:     In-House Referral:     Discharge planning Services     Post Acute Care Choice:    Choice offered to:     DME Arranged:    DME Agency:     HH Arranged:    [place] Agency:     Status of Service:     Medicare Important Message Given:    Date Medicare IM Given:    Medicare IM give by:    Date Additional Medicare IM Given:    Additional Medicare Important Message give by:     If discussed at [place] of Stay Meetings, dates discussed:    Additional Comments:  Jo Cerone A, RN 05/31/2015, 12:11 PM

## 2015-05-31 NOTE — Clinical Social Work Note (Signed)
CSW consulted in error for medication assistance. CSW has notified RN CM of consult and she will see. Shela Leff MSW,LCSWA [phone]

## 2015-05-31 NOTE — Progress Notes (Signed)
ANTIBIOTIC CONSULT NOTE - follow up  Pharmacy Consult for vancomycin dosing Indication: cellulitis/sepsis  Allergies  Allergen Reactions  . Avocado Swelling  . Sulfa Drugs Cross Reactors Anaphylaxis  . Adhesive [Tape]   . Amoxicillin Hives  . Clarithromycin   . Honey   . Other Hives and Swelling    All melon  . Watermelon Concentrate     Patient Measurements: Height: 5\' 4"  (162.6 cm) Weight: 245 lb 11.2 oz (111.449 kg) IBW/kg (Calculated) : 54.7 Adjusted Body Weight: 77 kg  Vital Signs:   Intake/Output from previous day: 06/19 0701 - 06/20 0700 In: 3736.7 [P.O.:1200; I.V.:2336.7; IV Piggyback:200] Out: 2700 [Urine:2700] Intake/Output from this shift: Total I/O In: 1398 [P.O.:1080; I.V.:318] Out: 700 [Urine:700]  Labs:  Recent Labs  05/29/15 1708 05/31/15 0444  WBC 2.9* 6.7  HGB 12.9 10.9*  PLT 427 375  CREATININE 1.29* 1.00   Estimated Creatinine Clearance: 87.7 mL/min (by C-G formula based on Cr of 1).   Microbiology: Recent Results (from the past 720 hour(s))  Wound culture     Status: None   Collection Time: 05/28/15  3:01 PM  Result Value Ref Range Status   Gram Stain No WBC Seen  Final   Gram Stain No Squamous Epithelial Cells Seen  Final   Gram Stain No Organisms Seen  Final   Organism ID, Bacteria NO GROWTH 2 DAYS  Final  Culture, blood (routine x 2)     Status: None (Preliminary result)   Collection Time: 05/29/15  4:30 PM  Result Value Ref Range Status   Specimen Description BLOOD  Final   Special Requests NONE  Final   Culture NO GROWTH 2 DAYS  Final   Report Status PENDING  Incomplete  Culture, blood (routine x 2)     Status: None (Preliminary result)   Collection Time: 05/29/15  8:24 PM  Result Value Ref Range Status   Specimen Description BLOOD  Final   Special Requests NONE  Final   Culture NO GROWTH 2 DAYS  Final   Report Status PENDING  Incomplete    Medical History: Past Medical History  Diagnosis Date  . Diabetes  mellitus   . Morbid obesity     s/p gastric bypass  . Anxiety   . Headache, common migraine, intractable   . Hyperlipidemia   . Depression 2010  . Anemia   . Rosacea   . Personal hx of gastric bypass 2010  . Vertigo   . Kidney stones     left  . Hypertension    . atorvastatin  20 mg Oral Daily  . buPROPion  300 mg Oral Daily  . fluticasone  2 spray Each Nare Daily  . heparin  5,000 Units Subcutaneous 3 times per day  . insulin aspart  0-15 Units Subcutaneous TID WC  . insulin aspart  0-5 Units Subcutaneous QHS  . insulin aspart  12 Units Subcutaneous TID WC  . insulin glargine  55 Units Subcutaneous QHS  . lamoTRIgine  150 mg Oral Daily  . lisinopril  5 mg Oral Daily  . methocarbamol  500 mg Oral QID  . pregabalin  50 mg Oral Daily  . vancomycin  1,000 mg Intravenous Q12H    Assessment: 44 yo female with Cellulits/sepsis. Blood and wound cx pending  Goal of Therapy:  Vancomycin trough level 15-20 mcg/ml  Plan:  TBW 111.4 kg  IBW 54.7kg  DW 77kg  Vd 54L kei 0.061 hr-1  T1/2 11 hours Vancomycin 1 gram q  12 hours ordered with stacked dosing. Level before 5th dose on 6/20 at 1930.  Chinita Greenland PharmD Clinical Pharmacist 05/31/2015

## 2015-05-31 NOTE — Progress Notes (Signed)
ANTIBIOTIC CONSULT NOTE - follow up  Pharmacy Consult for vancomycin dosing Indication: cellulitis/sepsis  Allergies  Allergen Reactions  . Avocado Swelling  . Sulfa Drugs Cross Reactors Anaphylaxis  . Adhesive [Tape]   . Amoxicillin Hives  . Clarithromycin   . Honey   . Other Hives and Swelling    All melon  . Watermelon Concentrate     Patient Measurements: Height: 5\' 4"  (162.6 cm) Weight: 245 lb 11.2 oz (111.449 kg) IBW/kg (Calculated) : 54.7 Adjusted Body Weight: 77 kg  Vital Signs: Temp: 98.6 F (37 C) (06/20 1540) Temp Source: Oral (06/20 1540) BP: 122/66 mmHg (06/20 1540) Pulse Rate: 88 (06/20 1540) Intake/Output from previous day: 06/19 0701 - 06/20 0700 In: 3736.7 [P.O.:1200; I.V.:2336.7; IV Piggyback:200] Out: 2700 [Urine:2700] Intake/Output from this shift:    Labs:  Recent Labs  05/29/15 1708 05/31/15 0444  WBC 2.9* 6.7  HGB 12.9 10.9*  PLT 427 375  CREATININE 1.29* 1.00   Estimated Creatinine Clearance: 87.7 mL/min (by C-G formula based on Cr of 1).   Microbiology: Recent Results (from the past 720 hour(s))  Wound culture     Status: None   Collection Time: 05/28/15  3:01 PM  Result Value Ref Range Status   Gram Stain No WBC Seen  Final   Gram Stain No Squamous Epithelial Cells Seen  Final   Gram Stain No Organisms Seen  Final   Organism ID, Bacteria NO GROWTH 2 DAYS  Final  Culture, blood (routine x 2)     Status: None (Preliminary result)   Collection Time: 05/29/15  4:30 PM  Result Value Ref Range Status   Specimen Description BLOOD  Final   Special Requests NONE  Final   Culture NO GROWTH 2 DAYS  Final   Report Status PENDING  Incomplete  Culture, blood (routine x 2)     Status: None (Preliminary result)   Collection Time: 05/29/15  8:24 PM  Result Value Ref Range Status   Specimen Description BLOOD  Final   Special Requests NONE  Final   Culture NO GROWTH 2 DAYS  Final   Report Status PENDING  Incomplete    Medical  History: Past Medical History  Diagnosis Date  . Diabetes mellitus   . Morbid obesity     s/p gastric bypass  . Anxiety   . Headache, common migraine, intractable   . Hyperlipidemia   . Depression 2010  . Anemia   . Rosacea   . Personal hx of gastric bypass 2010  . Vertigo   . Kidney stones     left  . Hypertension    . atorvastatin  20 mg Oral Daily  . buPROPion  300 mg Oral Daily  . fluticasone  2 spray Each Nare Daily  . heparin  5,000 Units Subcutaneous 3 times per day  . insulin aspart  0-15 Units Subcutaneous TID WC  . insulin aspart  0-5 Units Subcutaneous QHS  . insulin aspart  12 Units Subcutaneous TID WC  . insulin glargine  55 Units Subcutaneous QHS  . [START ON 06/01/2015] lamoTRIgine  100 mg Oral Daily  . lisinopril  5 mg Oral Daily  . pregabalin  50 mg Oral Daily  . vancomycin  1,000 mg Intravenous Q12H    Assessment: 44 yo female with Cellulits/sepsis. Blood and wound cx pending  Goal of Therapy:  Vancomycin trough level 15-20 mcg/ml  Plan:  TBW 111.4 kg  IBW 54.7kg  DW 77kg  Vd 54L kei 0.061 hr-1  T1/2 11 hours Vancomycin 1 gram q 12 hours ordered with stacked dosing. Level before 5th dose on 6/20 at 1930.  Vanc trough on 6/20 @ 19:30  = 16 mcg/mL Will continue this pt on current dose of Vanc 1 gm IV Q12H.   Jayjay Littles D  05/31/2015

## 2015-06-01 LAB — GLUCOSE, CAPILLARY
GLUCOSE-CAPILLARY: 221 mg/dL — AB (ref 65–99)
Glucose-Capillary: 279 mg/dL — ABNORMAL HIGH (ref 65–99)
Glucose-Capillary: 259 mg/dL — ABNORMAL HIGH (ref 65–99)
Glucose-Capillary: 175 mg/dL — ABNORMAL HIGH (ref 65–99)
Glucose-Capillary: 188 mg/dL — ABNORMAL HIGH (ref 65–99)

## 2015-06-01 MED ORDER — CLINDAMYCIN PHOSPHATE 600 MG/50ML IV SOLN
600.0000 mg | Freq: Three times a day (TID) | INTRAVENOUS | Status: DC
  Administered 2015-06-01 – 2015-06-02 (×3): 600 mg via INTRAVENOUS
  Filled 2015-06-01 (×7): qty 50

## 2015-06-01 MED ORDER — INSULIN GLARGINE 100 UNIT/ML ~~LOC~~ SOLN
60.0000 [IU] | Freq: Every day | SUBCUTANEOUS | Status: DC
  Administered 2015-06-01: 60 [IU] via SUBCUTANEOUS
  Filled 2015-06-01 (×2): qty 0.6

## 2015-06-01 MED ORDER — INSULIN ASPART 100 UNIT/ML ~~LOC~~ SOLN
16.0000 [IU] | Freq: Three times a day (TID) | SUBCUTANEOUS | Status: DC
  Administered 2015-06-01 – 2015-06-03 (×6): 16 [IU] via SUBCUTANEOUS
  Filled 2015-06-01 (×5): qty 16

## 2015-06-01 NOTE — Progress Notes (Signed)
Inpatient Diabetes Program Recommendations  AACE/ADA: New Consensus Statement on Inpatient Glycemic Control (2013)  Target Ranges:  Prepandial:   less than 140 mg/dL      Peak postprandial:   less than 180 mg/dL (1-2 hours)      Critically ill patients:  140 - 180 mg/dL    Results for Maria Bentley, Maria Bentley (MRN [id number]) as of 06/01/2015 14:09  Ref. Range 06/01/2015 07:58 06/01/2015 11:37  Glucose-Capillary Latest Ref Range: 65-99 mg/dL 259 (H) 175 (H)     Home DM Meds: NPH insulin- 55 units QHS  Glipizide 5 mg bid  Kombiglyze 2.04/999- 1 tablet bid  Current DM Orders: Lantus 60 units QHS  Novolog Moderate SSI (0-15 units) tid ac + HS             Novolog 16 units tidwc    **Note Dr. Gabriel Carina saw patient again today.  Further insulin adjustments made.  **Lantus increased to 60 units QHS and Novolog Meal Coverage increased to 16 units tidwc.  **Spoke with patient today about taking Novolog at home.  Patient told me she has taken Novolog at home before.  Discussed with patient that we presently are managing her CBGs with a Correction Scale (SSI) and a set dose of Novolog Meal Coverage as well.  Explained to patient that the doctors will not likely send her home on an SSI regimen but will likely send her home on the set dose of Novolog Meal Coverage.  Patient agreeable.  Patient asked me if I thought she needed an SSI regimen at home as well.  I explained to patient that she should check her CBGs three to four times per day and to keep a record.  If her CBGs are staying elevated, I advised patient to call Dr. [name] and inquire about adding a SSI regimen to home use.  Patient agreeable and appreciative of the information.    **Also explained to patient that the set dose of Novolog coverage we are sending her home on is intended to cover her carbohydrate intake.  Explained to patient that if she has a day where  she is not eating well and her CBGs are on the lower end, she may only want to take 50% of her set dose of Novolog.  If she does not eat and CBG is normal, advised patient to hold Novolog Meal Coverage as well.    Will follow Wyn Quaker RN, MSN, CDE Diabetes Coordinator Inpatient Glycemic Control Team Team Pager: [phone] (8a-5p)

## 2015-06-01 NOTE — Consult Note (Addendum)
Consult History and Physical   SERVICE: Gynecology, [facility]  Patient Name: Maria Bentley Patient MRN:   [id number]  CC: left labial swelling  HPI: Maria Bentley is a 44 y.o. G0P0 who is currently admitted to [facility] with right leg cellulitis.  She was admitted on 6/18 with working diagnosis of sepsis and started on vancomycin for broad coverage.  She was also found to have a blood glucose of 704, and has been treated with insulin sliding scale plus basal coverage.  Incidentally, prior to admission she noted a lump in her left groin which has subsequently gotten bigger while she has been inpatient. This was brought to the attention of her attending physician and I was consulted for recommendations.   Review of Systems: positives in bold GEN:   fevers, chills, weight changes, appetite changes, fatigue, night sweats HEENT:  HA, vision changes, hearing loss, congestion, rhinorrhea, sinus pressure, dysphagia CV:   CP, palpitations PULM:  SOB, cough GI:  abd pain, N/V/D/C GU:  dysuria, urgency, frequency MSK:  arthralgias, myalgias, back pain, swelling SKIN:  rashes, color changes, pallor NEURO:  numbness, weakness, tingling, seizures, dizziness, tremors PSYCH:  depression, anxiety, behavioral problems, confusion  HEME/LYMPH:  easy bruising or bleeding ENDO:  heat/cold intolerance  Past Obstetrical History: OB History    No data available      Past Gynecologic History: Patient's last menstrual period was 05/11/2015.  OB: G0, several failed fertility treatments GYN: no abn paps, no hx bartholin's gland cyst, last GYN exam 2015  Past Medical History: Past Medical History  Diagnosis Date  . Diabetes mellitus   . Morbid obesity     s/p gastric bypass  . Anxiety   . Headache, common migraine, intractable   . Hyperlipidemia   . Depression 2010  . Anemia   . Rosacea   . Personal hx of gastric bypass 2010  . Vertigo   . Kidney stones     left  .  Hypertension     Past Surgical History:   Past Surgical History  Procedure Laterality Date  . Gastric bypass    . Dilation and curettage of uterus  2011  . Ovarian cyst removal    . Rotator cuff repair      right    Family History:  family history includes Depression in her other; Diabetes in her father and mother; Hypertension in her father and mother. There is no history of Cancer.  Social History:  History   Social History  . Marital Status: Married    Spouse Name: N/A  . Number of Children: N/A  . Years of Education: N/A   Occupational History  . Not on file.   Social History Main Topics  . Smoking status: Former Smoker -- 1.00 packs/day for 4 years    Types: Cigarettes    Quit date: 02/05/1996  . Smokeless tobacco: Never Used  . Alcohol Use: Yes     Comment: Once a year.  . Drug Use: No  . Sexual Activity: Not on file   Other Topics Concern  . Not on file   Social History Narrative    Home Medications:  Medications reconciled in EPIC  No current facility-administered medications on file prior to encounter.   Current Outpatient Prescriptions on File Prior to Encounter  Medication Sig Dispense Refill  . acetaminophen (TYLENOL) 325 MG tablet Take 650 mg by mouth every 6 (six) hours as needed.    Marland Kitchen buPROPion (WELLBUTRIN XL) 300 MG 24 hr  tablet Take 300 mg by mouth daily.    Marland Kitchen doxycycline (VIBRA-TABS) 100 MG tablet Take 1 tablet (100 mg total) by mouth 2 (two) times daily. 20 tablet 0  . EPINEPHrine (EPIPEN 2-PAK) 0.3 mg/0.3 mL SOAJ injection Inject 0.3 mLs (0.3 mg total) into the muscle once. For severe allergic reaction 1 Device 5  . fluticasone (FLONASE) 50 MCG/ACT nasal spray USE TWO SPRAYS IN EACH NOSTRIL DAILY  16 g 3  . gentamicin ointment (GARAMYCIN) 0.1 % Apply 1 application topically 3 (three) times daily. 15 g 0  . glipiZIDE (GLUCOTROL) 5 MG tablet TAKE ONE TABLET BY MOUTH TWICE DAILY BEFORE MEALS  180 tablet 0  . hydrochlorothiazide (MICROZIDE)  12.5 MG capsule TAKE ONE CAPSULE BY MOUTH ONE TIME DAILY 30 capsule 1  . HYDROcodone-acetaminophen (NORCO) 10-325 MG per tablet Take 1 tablet by mouth every 6 (six) hours as needed.    . Insulin Syringe-Needle U-100 (B-D INS SYR ULTRAFINE 1CC/31G) 31G X 5/16" 1 ML MISC 1 Syringe by Does not apply route daily. 100 each 0  . Lancet Device MISC Test blood sugar two times daily 100 each 6  . lisinopril (PRINIVIL,ZESTRIL) 5 MG tablet Take 1 tablet (5 mg total) by mouth daily. 90 tablet 3  . meclizine (ANTIVERT) 25 MG tablet Take 1 tablet (25 mg total) by mouth 3 (three) times daily as needed. (Patient taking differently: Take 25 mg by mouth 3 (three) times daily as needed for dizziness. ) 90 tablet 6  . methocarbamol (ROBAXIN) 500 MG tablet TAKE ONE TABLET BY MOUTH FOUR TIMES DAILY  (Patient taking differently: Take one or two tabs four times daily, as needed) 90 tablet 3  . NOVOLIN N 100 UNIT/ML injection inject 55 units into the skin at bedtime 20 mL 5  . nystatin (MYCOSTATIN/NYSTOP) 100000 UNIT/GM POWD Apply to affected area  twice a day 60 g 0  . OnabotulinumtoxinA (BOTOX IJ) Inject as directed every 3 (three) months. For migraine    . ONETOUCH VERIO test strip TEST BLOOD SUGAR TWICE DAILY  50 each 5  . predniSONE (DELTASONE) 20 MG tablet 3 tablets daily x 3 days,  Then decrease by 1 tablet daily until gone  (5 days total) 12 tablet 0  . Prenatal Vit-Fe Fumarate-FA (PRENATAL MULTIVITAMIN) TABS Take 1 tablet by mouth daily.    . Saxagliptin-Metformin (KOMBIGLYZE XR) 2.04-999 MG TB24 take 1 tablet by mouth one time a day may increase to 2 tablets by mouth once a day after 1 to 2 weeks if needed 60 tablet 5  . SUMAtriptan (IMITREX) 100 MG tablet TAKE ONE TABLET BY MOUTH DAILY AS NEEDED 9 tablet 0  . VENTOLIN HFA 108 (90 BASE) MCG/ACT inhaler INHALE ONE PUFF BY MOUTH EVERY SIX HOURS AS NEEDED FOR WHEEZING  18 g 3  . potassium chloride (MICRO-K) 10 MEQ CR capsule TAKE TWO CAPSULES BY MOUTH DAILY (Patient  not taking: Reported on 05/29/2015) 90 capsule 2  . pregabalin (LYRICA) 50 MG capsule Take 300 mg by mouth at bedtime.     . promethazine (PHENERGAN) 25 MG tablet Take 1 tablet (25 mg total) by mouth every 8 (eight) hours as needed. (Patient not taking: Reported on 05/29/2015) 20 tablet 3  . sulfamethoxazole-trimethoprim (BACTRIM DS,SEPTRA DS) 800-160 MG per tablet Take 1 tablet by mouth 2 (two) times daily. (Patient not taking: Reported on 05/29/2015) 14 tablet 0  . traMADol (ULTRAM) 50 MG tablet Take one tablet by mouth every 6 hours  as needed for relief  from pain (Patient not taking: Reported on 05/29/2015) 90 tablet 1    Allergies:  Allergies  Allergen Reactions  . Avocado Swelling  . Sulfa Drugs Cross Reactors Anaphylaxis  . Adhesive [Tape]   . Amoxicillin Hives  . Clarithromycin   . Honey   . Other Hives and Swelling    All melon  . Watermelon Concentrate     Physical Exam:  Temp:  [97.9 F (36.6 C)-98.3 F (36.8 C)] 98.3 F (36.8 C) (06/21 1548) Pulse Rate:  [85-88] 88 (06/21 1548) Resp:  [14-17] 16 (06/21 1548) BP: (127-133)/(64-77) 133/75 mmHg (06/21 1548) SpO2:  [96 %-100 %] 98 % (06/21 1548)   General Appearance:  Well developed, obese, no acute distress, alert and oriented x3 Cardiovascular:  Normal S1/S2, regular rate and rhythm, no murmurs Pulmonary:  clear to auscultation, normal respiratory effort Abdomen:  Bowel sounds present, soft, nontender, nondistended, no abnormal masses, no epigastric pain Extremities:  Full range of motion, no pedal edema, 2+ distal pulses, no tenderness Skin:  normal coloration and turgor, right leg with erythematous area extending beyond markings placed upon admission. Psychiatric:  Normal mood and affect, appropriate Pelvic:  External genitalia: grossly normal with a left cystic subcutaneous lesion at 5 o'clock of the labia majora to the level of the introitus, with no spread deep into the vaginal walls. No erythema, no warmth, no  excoriations. Tender to the touch. Normal right labia   Labs/Studies:   CBC and Coags:  Lab Results  Component Value Date   WBC 6.7 05/31/2015   NEUTOPHILPCT 57 05/29/2015   EOSPCT 0 05/29/2015   BASOPCT 1 05/29/2015   LYMPHOPCT 36 05/29/2015   HGB 10.9* 05/31/2015   HCT 35.1 05/31/2015   MCV 83.9 05/31/2015   PLT 375 05/31/2015   CMP:  Lab Results  Component Value Date   NA 136 05/31/2015   K 4.0 05/31/2015   CL 106 05/31/2015   CO2 27 05/31/2015   BUN 17 05/31/2015   CREATININE 1.00 05/31/2015   CREATININE 1.29* 05/29/2015   CREATININE 1.07 05/28/2015   PROT 7.3 05/29/2015   BILITOT 0.7 05/29/2015   ALT 16 05/29/2015   AST 14* 05/29/2015   ALKPHOS 97 05/29/2015    Other Imaging: No results found.   Assessment / Plan:   Maria Bentley is a 44 y.o. G0P0 with bartholin's gland cyst.  1. This is isolated from her cellulitis, please continue your inpatient management. 2. Warm compresses and sitz bath x 24 hours, hopefully she will see some improvement.  If there is further growth of the cyst, I&D may need to be performed at the bedside or in the OR.  This is typically performed as an outpatient in the office, but her inpatient condition may delay discharge.  Due to her diabetic status, I am apprehensive about creating an incision that could potentially not heal well or very quickly, and lead to further complication.  If need be, we can insert a Word Catheter for symptomatic relief and treatment of the cyst.   3. Will re-evaluate tomorrow afternoon.  If patient happens to be discharged she should follow up with me in the office in the next day or so.   Thank you for the opportunity to be involved with this patient's care.   Larey Days, MD Attending Obstetrician and Gynecologist [facility]

## 2015-06-01 NOTE — Progress Notes (Signed)
[facility] NAME: Maria Bentley    MR#:  [id number]  DATE OF BIRTH:  [birth year]  SUBJECTIVE:  CHIEF COMPLAINT:   Chief Complaint  Patient presents with  . Cellulitis   chills, worsening redness and tenderness on right leg, left labial swelling and tenderness  REVIEW OF SYSTEMS:  CONSTITUTIONAL: No fever but has chills, fatigue or weakness.  EYES: No blurred or double vision.  EARS, NOSE, AND THROAT: No tinnitus or ear pain.  RESPIRATORY: No cough, shortness of breath, wheezing or hemoptysis.  CARDIOVASCULAR: No chest pain, orthopnea, edema.  GASTROINTESTINAL: No nausea, vomiting, diarrhea or abdominal pain.  GENITOURINARY: No dysuria, hematuria.  ENDOCRINE: No polyuria, nocturia,  HEMATOLOGY: No anemia, easy bruising or bleeding SKIN: No rash or lesion. Right leg erythema and tenderness is worse. MUSCULOSKELETAL: No joint pain or arthritis.   NEUROLOGIC: No tingling, numbness, weakness.  PSYCHIATRY: No anxiety or depression.   DRUG ALLERGIES:   Allergies  Allergen Reactions  . Avocado Swelling  . Sulfa Drugs Cross Reactors Anaphylaxis  . Adhesive [Tape]   . Amoxicillin Hives  . Clarithromycin   . Honey   . Other Hives and Swelling    All melon  . Watermelon Concentrate     VITALS:  Blood pressure 130/77, pulse 86, temperature 97.9 F (36.6 C), temperature source Oral, resp. rate 17, height 5\' 4"  (1.626 m), weight 111.449 kg (245 lb 11.2 oz), last menstrual period 05/11/2015, SpO2 96 %.  PHYSICAL EXAMINATION:  GENERAL:  44 y.o.-year-old patient lying in the bed with no acute distress. Obesity. EYES: Pupils equal, round, reactive to light and accommodation. No scleral icterus. Extraocular muscles intact.  HEENT: Head atraumatic, normocephalic. Oropharynx and nasopharynx clear.  NECK:  Supple, no jugular venous distention. No thyroid enlargement, no tenderness.  LUNGS: Normal breath sounds bilaterally, no  wheezing, rales,rhonchi or crepitation. No use of accessory muscles of respiration.  CARDIOVASCULAR: S1, S2 normal. No murmurs, rubs, or gallops.  ABDOMEN: Soft, nontender, nondistended. Bowel sounds present. No organomegaly or mass.  EXTREMITIES: No pedal edema, cyanosis, or clubbing. Worsened Erythematous nonblanching rash posterior right leg with tenderness. NEUROLOGIC: Cranial nerves II through XII are intact. Muscle strength 5/5 in all extremities. Sensation intact. Gait not checked.  PSYCHIATRIC: The patient is alert and oriented x 3.  SKIN: No obvious rash, lesion, or ulcer. Erythematous nonblanching rash posterior right leg. Left labial abscess 3 cm.   LABORATORY PANEL:   CBC  Recent Labs Lab 05/31/15 0444  WBC 6.7  HGB 10.9*  HCT 35.1  PLT 375   ------------------------------------------------------------------------------------------------------------------  Chemistries   Recent Labs Lab 05/29/15 1708 05/31/15 0444  NA 128* 136  K 5.1 4.0  CL 97* 106  CO2 22 27  GLUCOSE 704* 213*  BUN 31* 17  CREATININE 1.29* 1.00  CALCIUM 8.9 8.3*  AST 14*  --   ALT 16  --   ALKPHOS 97  --   BILITOT 0.7  --    ------------------------------------------------------------------------------------------------------------------  Cardiac Enzymes No results for input(s): TROPONINI in the last 168 hours. ------------------------------------------------------------------------------------------------------------------  RADIOLOGY:  No results found.  EKG:   Orders placed or performed in visit on 08/12/13  . Holter monitor - 48 hour    ASSESSMENT AND PLAN:   1.Sepsis,with right lower extremity cellulitis and Left labial abscess discontinue vancomycin and start clindamycin, blood culture and wound culture: negative. OB/GYN consult for left labial abscess.   2. Hyperglycemia with Uncontrolled type 2 diabetes: Hemoglobin  A1c 12.  Increase NovoLog to 60 units  subcutaneous at bedtime, add novolog 16 units tid AC per Dr. Gabriel Carina.   continue insulin sliding scale, hold oral agents.  3. Essential hypertension: Continue lisinopril  4. Dehydration. Improved. 5. Hyperlipidemia. On Lipitor.     All the records are reviewed and case discussed with Care Management/Social Workerr. Management plans discussed with the patient,  She is in agreement.  CODE STATUS: Full code   TOTAL TIME TAKING CARE OF THIS PATIENT: 37 minutes.   POSSIBLE D/C IN 2 DAYS, DEPENDING ON CLINICAL CONDITION.   Demetrios Loll M.D on 06/01/2015 at 1:02 PM  Between 7am to 6pm - Pager - [phone]  After 6pm go to [email or web address] - password [email or web address] [facility]  Office  [phone]  CC: Primary care physician; [name], MD

## 2015-06-01 NOTE — Progress Notes (Signed)
Maria Bentley is a 44 y.o. female seen in follow up for uncontrolled type 2 diabetes in setting of rt leg cellulitis. She felt unwell this morning with low appetite. Ate little breakfast. Attributes thi to her antibiotics. Sugars have been: 6/20 at 4PM -> 302 (NovoLog 12 +11) 6/20 at 9 PM -> 215 (NovoLog 8) 6/21 at 7 AM -> 259 (NovoLog 12 + 8) 6/21 at 12 PM -> 175 (NovoLog 12 +3)   Medical history Past Medical History  Diagnosis Date  . Diabetes mellitus   . Morbid obesity     s/p gastric bypass  . Anxiety   . Headache, common migraine, intractable   . Hyperlipidemia   . Depression 2010  . Anemia   . Rosacea   . Personal hx of gastric bypass 2010  . Vertigo   . Kidney stones     left  . Hypertension      Surgical history Past Surgical History  Procedure Laterality Date  . Gastric bypass    . Dilation and curettage of uterus  2011  . Ovarian cyst removal    . Rotator cuff repair      right     Medications . atorvastatin  20 mg Oral Daily  . buPROPion  300 mg Oral Daily  . clindamycin (CLEOCIN) IV  600 mg Intravenous 3 times per day  . fluticasone  2 spray Each Nare Daily  . heparin  5,000 Units Subcutaneous 3 times per day  . insulin aspart  0-15 Units Subcutaneous TID WC  . insulin aspart  0-5 Units Subcutaneous QHS  . insulin aspart  12 Units Subcutaneous TID WC  . insulin glargine  55 Units Subcutaneous QHS  . lamoTRIgine  100 mg Oral Daily  . lisinopril  5 mg Oral Daily  . pregabalin  50 mg Oral Daily    Review of systems CV: no chest pain or palpitations PULM: no cough or shortness of breath ABD: no abdominal pain or nausea or vomiting   Physical Exam BP 130/77 mmHg  Pulse 86  Temp(Src) 97.9 F (36.6 C) (Oral)  Resp 17  Ht 5\' 4"  (1.626 m)  Wt 111.449 kg (245 lb 11.2 oz)  BMI 42.15 kg/m2  SpO2 96%  LMP 05/11/2015  GEN: Obese white female in NAD. HEENT: No proptosis, EOMI, lid lag or stare. Oropharynx is clear.  NECK: supple, trachea  midline.   RESPIRATORY: clear bilaterally, no wheeze, good inspiratory effort. CV: No carotid bruits, RRR. ABD: soft, NT/ND, no organomegaly EXT: no peripheral edema PSYC: alert and oriented, good insight  Assessment 1.Diabetes mellitus, type 2 uncontrolled 2. Cellulitis 3. Morbid obesity  Plan Increase Lantus to 60 units (as ordered)  Increase NovoLog to 16 units tid AC  Continue NovoLog SSI as ordered.  If patient is discharged before I re-evaluate, then please discharge on the following medications for diabetes. Her NovoLog SSI will not be continued, however her oral diabetes medications will be restarted. Lantus will not be continued, as she uses Novolin NPH at home and that can be restarted instead. Note that she will need a prescription for the medication with the ** as noted. Novolin N 100 units/ml VIAL 60 units qhs Kombiglyze 2.04/999 bid  glipizide 5 mg bid NovoLog Flex pen 16 units three times daily before meals (15 ml rf0) ** RXN NEEDED Insulin pen needles for use three times daily before meals (#100 rf0) ** RXN NEEDED  Will follow along with you.

## 2015-06-02 ENCOUNTER — Other Ambulatory Visit: Payer: Self-pay | Admitting: Internal Medicine

## 2015-06-02 LAB — RAPID HIV SCREEN (HIV 1/2 AB+AG)
HIV 1/2 Antibodies: NONREACTIVE
HIV-1 P24 Antigen - HIV24: NONREACTIVE

## 2015-06-02 LAB — GLUCOSE, CAPILLARY
GLUCOSE-CAPILLARY: 191 mg/dL — AB (ref 65–99)
GLUCOSE-CAPILLARY: 179 mg/dL — AB (ref 65–99)
Glucose-Capillary: 250 mg/dL — ABNORMAL HIGH (ref 65–99)
Glucose-Capillary: 76 mg/dL (ref 65–99)

## 2015-06-02 MED ORDER — INSULIN GLARGINE 100 UNIT/ML ~~LOC~~ SOLN
65.0000 [IU] | Freq: Every day | SUBCUTANEOUS | Status: DC
  Administered 2015-06-02: 65 [IU] via SUBCUTANEOUS
  Filled 2015-06-02 (×2): qty 0.65

## 2015-06-02 MED ORDER — VANCOMYCIN HCL IN DEXTROSE 1-5 GM/200ML-% IV SOLN
1000.0000 mg | Freq: Two times a day (BID) | INTRAVENOUS | Status: DC
  Administered 2015-06-02 – 2015-06-04 (×4): 1000 mg via INTRAVENOUS
  Filled 2015-06-02 (×7): qty 200

## 2015-06-02 MED ORDER — CLOBETASOL PROPIONATE 0.05 % EX CREA
TOPICAL_CREAM | Freq: Two times a day (BID) | CUTANEOUS | Status: DC
  Administered 2015-06-02 – 2015-06-04 (×4): via TOPICAL
  Filled 2015-06-02: qty 15

## 2015-06-02 NOTE — Progress Notes (Signed)
[facility] [place] NAME: Maria Bentley    MR#:  [id number]  DATE OF BIRTH:  02/13/1971  SUBJECTIVE:  CHIEF COMPLAINT:   Chief Complaint  Patient presents with  . Cellulitis  worsening redness and tenderness on right leg, feels itchy,  left labial swelling and tenderness is better.   REVIEW OF SYSTEMS:  CONSTITUTIONAL: No fever or  chills, fatigue or weakness.  EYES: No blurred or double vision.  EARS, NOSE, AND THROAT: No tinnitus or ear pain.  RESPIRATORY: No cough, shortness of breath, wheezing or hemoptysis.  CARDIOVASCULAR: No chest pain, orthopnea, edema.  GASTROINTESTINAL: No nausea, vomiting, diarrhea or abdominal pain.  GENITOURINARY: No dysuria, hematuria.  ENDOCRINE: No polyuria, nocturia,  HEMATOLOGY: No anemia, easy bruising or bleeding SKIN: No rash or lesion. Right leg erythema is larger and tenderness is worse. MUSCULOSKELETAL: No joint pain or arthritis.   NEUROLOGIC: No tingling, numbness, weakness.  PSYCHIATRY: No anxiety or depression.   DRUG ALLERGIES:   Allergies  Allergen Reactions  . Avocado Swelling  . Sulfa Drugs Cross Reactors Anaphylaxis  . Adhesive [Tape]   . Amoxicillin Hives  . Clarithromycin   . Honey   . Other Hives and Swelling    All melon  . Watermelon Concentrate     VITALS:  Blood pressure 128/86, pulse 84, temperature 98 F (36.7 C), temperature source Oral, resp. rate 12, height 5\' 4"  (1.626 m), weight 111.449 kg (245 lb 11.2 oz), last menstrual period 05/11/2015, SpO2 95 %.  PHYSICAL EXAMINATION:  GENERAL:  44 y.o.-year-old patient lying in the bed with no acute distress. Obesity. EYES: Pupils equal, round, reactive to light and accommodation. No scleral icterus. Extraocular muscles intact.  HEENT: Head atraumatic, normocephalic. Oropharynx and nasopharynx clear.  NECK:  Supple, no jugular venous distention. No thyroid enlargement, no tenderness.  LUNGS: Normal breath  sounds bilaterally, no wheezing, rales,rhonchi or crepitation. No use of accessory muscles of respiration.  CARDIOVASCULAR: S1, S2 normal. No murmurs, rubs, or gallops.  ABDOMEN: Soft, nontender, nondistended. Bowel sounds present. No organomegaly or mass.  EXTREMITIES: No pedal edema, cyanosis, or clubbing. Worsened Erythematous nonblanching rash posterior right leg with tenderness. NEUROLOGIC: Cranial nerves II through XII are intact. Muscle strength 5/5 in all extremities. Sensation intact. Gait not checked.  PSYCHIATRIC: The patient is alert and oriented x 3.  SKIN: No obvious rash, lesion, or ulcer. Erythematous nonblanching rash posterior right leg. Left labial abscess 3 cm.   LABORATORY PANEL:   CBC  Recent Labs Lab 05/31/15 0444  WBC 6.7  HGB 10.9*  HCT 35.1  PLT 375   ------------------------------------------------------------------------------------------------------------------  Chemistries   Recent Labs Lab 05/29/15 1708 05/31/15 0444  NA 128* 136  K 5.1 4.0  CL 97* 106  CO2 22 27  GLUCOSE 704* 213*  BUN 31* 17  CREATININE 1.29* 1.00  CALCIUM 8.9 8.3*  AST 14*  --   ALT 16  --   ALKPHOS 97  --   BILITOT 0.7  --    ------------------------------------------------------------------------------------------------------------------  Cardiac Enzymes No results for input(s): TROPONINI in the last 168 hours. ------------------------------------------------------------------------------------------------------------------  RADIOLOGY:  No results found.  EKG:   Orders placed or performed in visit on 08/12/13  . Holter monitor - 48 hour    ASSESSMENT AND PLAN:   1.Sepsis,with right lower extremity cellulitis. Continue clindamycin, blood culture and wound culture: negative.ID consult.  2. Hyperglycemia with Uncontrolled type 2 diabetes: Hemoglobin A1c 12.  Increase NovoLog to 65  units subcutaneous at bedtime, continue novolog 16 units tid AC,  follow-up Dr. Gabriel Carina.   continue insulin sliding scale, hold oral agents.  3. Essential hypertension: Continue lisinopril  4. Dehydration. Improved. 5. Hyperlipidemia. On Lipitor.   Left labial cyst.  Warm compresses and sitz bath x 24 hours, follow-up OB/GYN physician.    All the records are reviewed and case discussed with Care Management/Social Workerr. Management plans discussed with the patient,  She is in agreement.  CODE STATUS: Full code   TOTAL TIME TAKING CARE OF THIS PATIENT: 37 minutes.   POSSIBLE D/C IN 2 DAYS, DEPENDING ON CLINICAL CONDITION.   Demetrios Loll M.D on 06/02/2015 at 11:50 AM  Between 7am to 6pm - Pager - [phone]  After 6pm go to [email or web address] - password [email or web address] [facility]  Office  [phone]  CC: Primary care physician; [name], MD

## 2015-06-02 NOTE — Consult Note (Signed)
[facility] Infectious Disease     Reason for Consult Cellulitis    Referring Physician: Bridgett Larsson Date of Admission:  05/29/2015   Principal Problem:   Sepsis affecting skin   HPI: Maria Bentley is a 44 y.o. female with IDDM admitted 6/18 with worsening redness R leg and markedly elevated sugars.  Had been started on doxy by PCP when dxed with RLE cellulitis and then pred taper for possible bullous impetigo prior  to admission.  Since admission she has also developed a bartholin cyst and has been seen by Gyn.  Has been on IV vanco and clindamycin since admission but min improvement in the skin eruption No fevers, chills but was feeling bad prior to admission due to high sugars and feels better since then. She has pet dogs at home but does not recall any tick bites, insect bites or contact with poison ivy/oak.  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 Has not seen derm in several years.  Doe shave multiple allergies and gets allergy shots.  Past Medical History  Diagnosis Date  . Diabetes mellitus   . Morbid obesity     s/p gastric bypass  . Anxiety   . Headache, common migraine, intractable   . Hyperlipidemia   . Depression 2010  . Anemia   . Rosacea   . Personal hx of gastric bypass 2010  . Vertigo   . Kidney stones     left  . Hypertension    Past Surgical History  Procedure Laterality Date  . Gastric bypass    . Dilation and curettage of uterus  2011  . Ovarian cyst removal    . Rotator cuff repair      right   History  Substance Use Topics  . Smoking status: Former Smoker -- 1.00 packs/day for 4 years    Types: Cigarettes    Quit date: 02/05/1996  . Smokeless tobacco: Never Used  . Alcohol Use: Yes     Comment: Once a year.   Family History  Problem Relation Age of Onset  . Diabetes Mother   . Hypertension Mother   . Cancer Neg Hx   . Depression Other     strong hx of  . Hypertension Father   . Diabetes  Father     Allergies:  Allergies  Allergen Reactions  . Avocado Swelling  . Sulfa Drugs Cross Reactors Anaphylaxis  . Adhesive [Tape]   . Amoxicillin Hives  . Clarithromycin   . Honey   . Other Hives and Swelling    All melon  . Watermelon Concentrate     Current antibiotics: Antibiotics Given (last 72 hours)    Date/Time Action Medication Dose Rate   05/30/15 2014 Given   vancomycin (VANCOCIN) IVPB 1000 mg/200 mL premix 1,000 mg 200 mL/hr   05/31/15 0828 Given   vancomycin (VANCOCIN) IVPB 1000 mg/200 mL premix 1,000 mg 200 mL/hr   05/31/15 2035 Given   vancomycin (VANCOCIN) IVPB 1000 mg/200 mL premix 1,000 mg 200 mL/hr   06/01/15 1500 Given   clindamycin (CLEOCIN) IVPB 600 mg 600 mg 100 mL/hr   06/01/15 2211 Given   clindamycin (CLEOCIN) IVPB 600 mg 600 mg 100 mL/hr   06/02/15 0607 Given   clindamycin (CLEOCIN) IVPB 600 mg 600 mg 100 mL/hr      MEDICATIONS: . atorvastatin  20 mg Oral Daily  . buPROPion  300 mg Oral Daily  . clindamycin (CLEOCIN) IV  600 mg Intravenous 3 times per day  . fluticasone  2 spray Each Nare Daily  . heparin  5,000 Units Subcutaneous 3 times per day  . insulin aspart  0-15 Units Subcutaneous TID WC  . insulin aspart  0-5 Units Subcutaneous QHS  . insulin aspart  16 Units Subcutaneous TID WC  . insulin glargine  65 Units Subcutaneous QHS  . lamoTRIgine  100 mg Oral Daily  . lisinopril  5 mg Oral Daily  . pregabalin  50 mg Oral Daily    Review of Systems - 11 systems reviewed and negative per HPI   OBJECTIVE: Temp:  [98 F (36.7 C)-98.3 F (36.8 C)] 98 F (36.7 C) (06/22 0727) Pulse Rate:  [79-88] 84 (06/22 0727) Resp:  [12-18] 12 (06/22 0727) BP: (107-133)/(51-86) 128/86 mmHg (06/22 0727) SpO2:  [95 %-98 %] 95 % (06/22 0727) Physical Exam  Constitutional:  oriented to person, place, and time. Obese  No distress.  HENT: [place]/AT, PERRLA, no scleral icterus Mouth/Throat: Oropharynx is clear and moist. No oropharyngeal exudate.   Cardiovascular: Normal rate, regular rhythm and normal heart sounds. Exam reveals no gallop and no friction rub.  No murmur heard.  Pulmonary/Chest: Effort normal and breath sounds normal. No respiratory distress.  has no wheezes.  Neck  supple, no nuchal rigidity Abdominal: Soft. Bowel sounds are normal.  exhibits no distension. There is no tenderness.  Lymphadenopathy: no cervical adenopathy. No axillary adenopathy Neurological: alert and oriented to person, place, and time.  Skin: R pop fossa with large plaque like area of thickened raised, erythematous skin than extends posteriorly up thigh There are several other areas of smaller lesions above. Area is mildly warm. 2 areas of denuded skin. No drainage.  R foot with healed scab Psychiatric: a normal mood and affect.  behavior is normal.   LABS: Results for orders placed or performed during the hospital encounter of 05/29/15 (from the past 48 hour(s))  Glucose, capillary     Status: Abnormal   Collection Time: 05/31/15  4:42 PM  Result Value Ref Range   Glucose-Capillary 302 (H) 65 - 99 mg/dL  Vancomycin, trough     Status: None   Collection Time: 05/31/15  7:13 PM  Result Value Ref Range   Vancomycin Tr 16 10 - 20 ug/mL  Glucose, capillary     Status: Abnormal   Collection Time: 05/31/15  9:36 PM  Result Value Ref Range   Glucose-Capillary 216 (H) 65 - 99 mg/dL   Comment 1 Notify RN   Glucose, capillary     Status: Abnormal   Collection Time: 06/01/15  1:32 AM  Result Value Ref Range   Glucose-Capillary 279 (H) 65 - 99 mg/dL  Glucose, capillary     Status: Abnormal   Collection Time: 06/01/15  7:58 AM  Result Value Ref Range   Glucose-Capillary 259 (H) 65 - 99 mg/dL   Comment 1 Notify RN   Glucose, capillary     Status: Abnormal   Collection Time: 06/01/15 11:37 AM  Result Value Ref Range   Glucose-Capillary 175 (H) 65 - 99 mg/dL   Comment 1 Notify RN   Glucose, capillary     Status: Abnormal   Collection Time:  06/01/15  4:20 PM  Result Value Ref Range   Glucose-Capillary 221 (H) 65 - 99 mg/dL   Comment 1 Notify RN   Glucose, capillary     Status: Abnormal   Collection Time: 06/01/15  9:18 PM  Result Value Ref Range  Glucose-Capillary 188 (H) 65 - 99 mg/dL  Glucose, capillary     Status: Abnormal   Collection Time: 06/02/15  7:25 AM  Result Value Ref Range   Glucose-Capillary 250 (H) 65 - 99 mg/dL  Glucose, capillary     Status: Abnormal   Collection Time: 06/02/15 11:38 AM  Result Value Ref Range   Glucose-Capillary 191 (H) 65 - 99 mg/dL   No components found for: ESR, C REACTIVE PROTEIN MICRO: Recent Results (from the past 720 hour(s))  Wound culture     Status: None   Collection Time: 05/28/15  3:01 PM  Result Value Ref Range Status   Gram Stain No WBC Seen  Final   Gram Stain No Squamous Epithelial Cells Seen  Final   Gram Stain No Organisms Seen  Final   Organism ID, Bacteria NO GROWTH 2 DAYS  Final  Culture, blood (routine x 2)     Status: None (Preliminary result)   Collection Time: 05/29/15  4:30 PM  Result Value Ref Range Status   Specimen Description BLOOD  Final   Special Requests NONE  Final   Culture NO GROWTH 4 DAYS  Final   Report Status PENDING  Incomplete  Culture, blood (routine x 2)     Status: None (Preliminary result)   Collection Time: 05/29/15  8:24 PM  Result Value Ref Range Status   Specimen Description BLOOD  Final   Special Requests NONE  Final   Culture NO GROWTH 4 DAYS  Final   Report Status PENDING  Incomplete    IMAGING: No results found.  Assessment:   Maria Bentley is a 44 y.o. female with IDDM, obesity, s/p gastric bypass, multiple allergies, prior granuloma annulare admitted with progressive eruption over R post popliteal area. No fevers, chills, or leukoocytosis.  Area has not responded to vanco and clindamycin. No drainage noted so no cultures done.  I do not think this is a simple cellulitis and may be more of a dermatological  condition.   Recommendations Will consult derm for possible bxp.  Continue current vanco - can dc clindamycin Check Hep C and HIV Thank you very much for allowing me to participate in the care of this patient. Please call with questions.   Cheral Marker. Ola Spurr, MD

## 2015-06-02 NOTE — Progress Notes (Signed)
  ANTIBIOTIC CONSULT NOTE - follow up  Pharmacy Consult for vancomycin dosing Indication: cellulitis/sepsis  Allergies  Allergen Reactions  . Avocado Swelling  . Sulfa Drugs Cross Reactors Anaphylaxis  . Adhesive [Tape]   . Amoxicillin Hives  . Clarithromycin   . Honey   . Other Hives and Swelling    All melon  . Watermelon Concentrate     Patient Measurements: Height: 5\' 4"  (162.6 cm) Weight: 245 lb 11.2 oz (111.449 kg) IBW/kg (Calculated) : 54.7 Adjusted Body Weight: 77 kg  Vital Signs: Temp: 98 F (36.7 C) (06/22 0727) Temp Source: Oral (06/22 0727) BP: 128/86 mmHg (06/22 0727) Pulse Rate: 84 (06/22 0727)  Labs:  Recent Labs  05/31/15 0444  WBC 6.7  HGB 10.9*  PLT 375  CREATININE 1.00   Estimated Creatinine Clearance: 87.7 mL/min (by C-G formula based on Cr of 1).   Microbiology: Recent Results (from the past 720 hour(s))  Wound culture     Status: None   Collection Time: 05/28/15  3:01 PM  Result Value Ref Range Status   Gram Stain No WBC Seen  Final   Gram Stain No Squamous Epithelial Cells Seen  Final   Gram Stain No Organisms Seen  Final   Organism ID, Bacteria NO GROWTH 2 DAYS  Final  Culture, blood (routine x 2)     Status: None (Preliminary result)   Collection Time: 05/29/15  4:30 PM  Result Value Ref Range Status   Specimen Description BLOOD  Final   Special Requests NONE  Final   Culture NO GROWTH 4 DAYS  Final   Report Status PENDING  Incomplete  Culture, blood (routine x 2)     Status: None (Preliminary result)   Collection Time: 05/29/15  8:24 PM  Result Value Ref Range Status   Specimen Description BLOOD  Final   Special Requests NONE  Final   Culture NO GROWTH 4 DAYS  Final   Report Status PENDING  Incomplete   Antibiotics Given (last 72 hours)    Date/Time Action Medication Dose Rate   05/30/15 2014 Given   vancomycin (VANCOCIN) IVPB 1000 mg/200 mL premix 1,000 mg 200 mL/hr   05/31/15 0828 Given   vancomycin (VANCOCIN) IVPB  1000 mg/200 mL premix 1,000 mg 200 mL/hr   05/31/15 2035 Given   vancomycin (VANCOCIN) IVPB 1000 mg/200 mL premix 1,000 mg 200 mL/hr   06/01/15 1500 Given   clindamycin (CLEOCIN) IVPB 600 mg 600 mg 100 mL/hr   06/01/15 2211 Given   clindamycin (CLEOCIN) IVPB 600 mg 600 mg 100 mL/hr   06/02/15 0607 Given   clindamycin (CLEOCIN) IVPB 600 mg 600 mg 100 mL/hr     Assessment: 44 yo female with cellulitis, on vancomycin 6/19-6/20, changed to clindamycin 6/21. Patient not improving, switching back to vancomycin at this time.   Goal of Therapy:  Vancomycin trough level 15-20 mcg/ml  Plan:  Patient previously on vancomycin 1gm IV Q12H with a trough of 16. Will resume previous dosing regimen. May recheck trough at steady state.  Rexene Edison, PharmD Clinical Pharmacist   06/02/2015 1:32 PM

## 2015-06-02 NOTE — Progress Notes (Addendum)
Inpatient Diabetes Program Recommendations  AACE/ADA: New Consensus Statement on Inpatient Glycemic Control (2013)  Target Ranges:  Prepandial:   less than 140 mg/dL      Peak postprandial:   less than 180 mg/dL (1-2 hours)      Critically ill patients:  140 - 180 mg/dL   Results for Maria Bentley, Maria Bentley (MRN [id number]) as of 06/02/2015 09:17  Ref. Range 06/01/2015 07:58 06/01/2015 11:37 06/01/2015 16:20 06/01/2015 21:18 06/02/2015 07:25  Glucose-Capillary Latest Ref Range: 65-99 mg/dL 259 (H) 175 (H) 221 (H) 188 (H) 250 (H)   Diabetes history: DM2 Outpatient Diabetes medications: NPH 55 units QHS, Glipizide 5 mg BID Current orders for Inpatient glycemic control: Lantus 60 units QHS, Novolog 0-15 units TID with meals, Novolog 0-5 units HS, Novolog 16 units TID with meals  Note:Per Dr. Joycie Peek note on 06/01/15 "If patient is discharged before I re-evaluate, then please discharge on the following medications for diabetes. Her NovoLog SSI will not be continued, however her oral diabetes medications will be restarted. Lantus will not be continued, as she uses Novolin NPH at home and that can be restarted instead. Note that she will need a prescription for the medication with the ** as noted.  Novolin N 100 units/ml VIAL 60 units qhs Kombiglyze 2.04/999 bid  glipizide 5 mg bid NovoLog Flex pen 16 units three times daily before meals (15 ml rf0) ** RXN NEEDED Insulin pen needles for use three times daily before meals (#100 rf0) ** RXN NEEDED"  Will plan to talk with patient regarding Dr. Joycie Peek outpatient plan and educate on insulin pen if needed.  06/02/15@10 :30- Spoke with patient regarding insulin pens. Patient states that she has used insulin pens in the past and she feels comfortable with using an insulin pen if prescribed at time of discharge.  Thanks, Barnie Alderman, RN, MSN, CCRN, CDE Diabetes Coordinator Inpatient Diabetes Program [phone] (Team Pager from [place] to [place]) [phone] ([facility]  office) [phone] [facility] office) [phone] [facility] office)

## 2015-06-02 NOTE — Progress Notes (Signed)
Obstetric and Gynecology  POD/HD # 5  Subjective  Patient states that the swelling and pain have become a little more painful.  She did hot compresses throughout the day and has been using a sitz bath.  As part of her pain control for her cellulitis, she was given Norco, and states this helps with her pain.    As of the cellulitis, she is back on vancomycin as the redness spread beyond marked borders on clindamycin. There is concern that the area is not characteristic of a cellulitis, and perhaps more of a reaction.  Dermatology has been consulted.    Denies CP, SOB, F/C, N/V/D, or leg pain.   Objective  Objective:   Filed Vitals:   06/01/15 1548 06/01/15 2358 06/02/15 0727 06/02/15 1532  BP: 133/75 107/51 128/86 101/85  Pulse: 88 79 84 84  Temp: 98.3 F (36.8 C) 98.2 F (36.8 C) 98 F (36.7 C) 97.3 F (36.3 C)  TempSrc: Oral Oral Oral Oral  Resp: 16 18 12    Height:      Weight:      SpO2: 98% 95% 95% 97%      Intake/Output Summary (Last 24 hours) at 06/02/15 1950 Last data filed at 06/02/15 1700  Gross per 24 hour  Intake   1990 ml  Output    800 ml  Net   1190 ml    General: NAD Cardiovascular: RRR, no murmurs Pulmonary: CTAB, normal respiratory effort Abdomen: Benign. Non-tender, +BS, no guarding Extremities: LLE normal, no calf ttp.  RLE with dorsal ulceration surrounded by a papular rash that extends beyond borders marked on admission.  Rash is spreading anteriorly up dorsal thigh. GU: swelling of the left labia with a new, thicker area, possibly forming a head.   Labs: Results for orders placed or performed during the hospital encounter of 05/29/15 (from the past 24 hour(s))  Glucose, capillary     Status: Abnormal   Collection Time: 06/01/15  9:18 PM  Result Value Ref Range   Glucose-Capillary 188 (H) 65 - 99 mg/dL  Glucose, capillary     Status: Abnormal   Collection Time: 06/02/15  7:25 AM  Result Value Ref Range   Glucose-Capillary 250 (H) 65 - 99  mg/dL  Glucose, capillary     Status: Abnormal   Collection Time: 06/02/15 11:38 AM  Result Value Ref Range   Glucose-Capillary 191 (H) 65 - 99 mg/dL  Rapid HIV screen (HIV 1/2 Ab+Ag)     Status: None   Collection Time: 06/02/15  1:03 PM  Result Value Ref Range   HIV-1 P24 Antigen - HIV24 NON REACTIVE NON REACTIVE   HIV 1/2 Antibodies NON REACTIVE NON REACTIVE   Interpretation (HIV Ag Ab)      A non reactive test result means that HIV 1 or HIV 2 antibodies and HIV 1 p24 antigen were not detected in the specimen.  Glucose, capillary     Status: Abnormal   Collection Time: 06/02/15  4:33 PM  Result Value Ref Range   Glucose-Capillary 179 (H) 65 - 99 mg/dL    Cultures: Results for orders placed or performed during the hospital encounter of 05/29/15  Culture, blood (routine x 2)     Status: None (Preliminary result)   Collection Time: 05/29/15  4:30 PM  Result Value Ref Range Status   Specimen Description BLOOD  Final   Special Requests NONE  Final   Culture NO GROWTH 4 DAYS  Final   Report Status PENDING  Incomplete  Culture, blood (routine x 2)     Status: None (Preliminary result)   Collection Time: 05/29/15  8:24 PM  Result Value Ref Range Status   Specimen Description BLOOD  Final   Special Requests NONE  Final   Culture NO GROWTH 4 DAYS  Final   Report Status PENDING  Incomplete    Imaging: No results found.   Assessment   43 y.o. with left bartholins gland cyst, and LE cellulitis and uncontrolled diabetes. Hospital Day: 5   Plan   1. Diabetes/cellulits per primary team 2. Bartholin's gland cyst:  I am still apprehensive about performing any procedure while there is both an active infection and poor glucose control. Patient to continue sitz baths and warm compresses.  Will reevaluate in 2 days and if not better will go ahead with bedside I&D on Friday.   Larey Days, MD Attending Obstetrician and Gynecologist [place]

## 2015-06-03 LAB — GLUCOSE, CAPILLARY
GLUCOSE-CAPILLARY: 230 mg/dL — AB (ref 65–99)
Glucose-Capillary: 257 mg/dL — ABNORMAL HIGH (ref 65–99)
Glucose-Capillary: 169 mg/dL — ABNORMAL HIGH (ref 65–99)
Glucose-Capillary: 274 mg/dL — ABNORMAL HIGH (ref 65–99)

## 2015-06-03 LAB — CREATININE, SERUM: CREATININE: 0.91 mg/dL (ref 0.44–1.00)

## 2015-06-03 LAB — HEPATITIS C ANTIBODY: HCV Ab: <0.1 s/co ratio (ref 0.0–0.9)

## 2015-06-03 LAB — VANCOMYCIN, TROUGH: Vancomycin Tr: 12 ug/mL (ref 10–20)

## 2015-06-03 MED ORDER — INSULIN GLARGINE 100 UNIT/ML ~~LOC~~ SOLN
68.0000 [IU] | Freq: Every day | SUBCUTANEOUS | Status: DC
  Filled 2015-06-03: qty 0.68

## 2015-06-03 MED ORDER — INSULIN ASPART 100 UNIT/ML ~~LOC~~ SOLN: 20.0000 [IU] | Freq: Three times a day (TID) | SUBCUTANEOUS | Status: DC

## 2015-06-03 MED ORDER — INSULIN GLARGINE 100 UNIT/ML ~~LOC~~ SOLN
65.0000 [IU] | Freq: Every day | SUBCUTANEOUS | Status: DC
  Administered 2015-06-03: 65 [IU] via SUBCUTANEOUS
  Filled 2015-06-03 (×2): qty 0.65

## 2015-06-03 MED ORDER — INSULIN ASPART 100 UNIT/ML ~~LOC~~ SOLN
16.0000 [IU] | Freq: Three times a day (TID) | SUBCUTANEOUS | Status: DC
  Administered 2015-06-03 – 2015-06-04 (×2): 16 [IU] via SUBCUTANEOUS
  Filled 2015-06-03 (×2): qty 16

## 2015-06-03 MED ORDER — INSULIN GLARGINE 100 UNIT/ML ~~LOC~~ SOLN
60.0000 [IU] | Freq: Every day | SUBCUTANEOUS | Status: DC
  Filled 2015-06-03: qty 0.6

## 2015-06-03 NOTE — Progress Notes (Signed)
Date of Admission:  05/29/2015     ID: Unity Luepke is a 44 y.o. female with  [place] Principal Problem:   Sepsis affecting skin   Subjective: Seen by derm. Rash slowly improving , no fevers  Medications:  . atorvastatin  20 mg Oral Daily  . buPROPion  300 mg Oral Daily  . clobetasol cream   Topical BID  . fluticasone  2 spray Each Nare Daily  . heparin  5,000 Units Subcutaneous 3 times per day  . insulin aspart  0-15 Units Subcutaneous TID WC  . insulin aspart  0-5 Units Subcutaneous QHS  . insulin aspart  16 Units Subcutaneous TID WC  . insulin glargine  65 Units Subcutaneous QHS  . lamoTRIgine  100 mg Oral Daily  . lisinopril  5 mg Oral Daily  . pregabalin  50 mg Oral Daily  . vancomycin  1,000 mg Intravenous Q12H    Objective: Vital signs in last 24 hours: Temp:  [97.3 F (36.3 C)-98.4 F (36.9 C)] 98.3 F (36.8 C) (06/23 0759) Pulse Rate:  [76-85] 76 (06/23 0759) Resp:  [16-17] 17 (06/23 0759) BP: (101-124)/(71-85) 111/71 mmHg (06/23 0759) SpO2:  [95 %-98 %] 98 % (06/23 0759) Constitutional: oriented to person, place, and time. Obese No distress.  HENT: [place]/AT, PERRLA, no scleral icterus Mouth/Throat: Oropharynx is clear and moist. No oropharyngeal exudate.  Cardiovascular: Normal rate, regular rhythm and normal heart sounds. Exam reveals no gallop and no friction rub.  No murmur heard.  Pulmonary/Chest: Effort normal and breath sounds normal. No respiratory distress. has no wheezes.  Neck supple, no nuchal rigidity Abdominal: Soft. Bowel sounds are normal. exhibits no distension. There is no tenderness.  Lymphadenopathy: no cervical adenopathy. No axillary adenopathy Neurological: alert and oriented to person, place, and time.  Skin: R pop fossa with large plaque like area of thickened raised, erythematous skin than extends posteriorly up thigh There are several other areas of smaller lesions above. Area is mildly warm. 2 areas of denuded  skin. No drainage.  R foot with healed scab Psychiatric: a normal mood and affect. behavior is normal.   Lab Results  Recent Labs  06/03/15 0415  CREATININE 0.91   Microbiology: Results for orders placed or performed during the hospital encounter of 05/29/15  Culture, blood (routine x 2)     Status: None (Preliminary result)   Collection Time: 05/29/15  4:30 PM  Result Value Ref Range Status   Specimen Description BLOOD  Final   Special Requests NONE  Final   Culture NO GROWTH 4 DAYS  Final   Report Status PENDING  Incomplete  Culture, blood (routine x 2)     Status: None (Preliminary result)   Collection Time: 05/29/15  8:24 PM  Result Value Ref Range Status   Specimen Description BLOOD  Final   Special Requests NONE  Final   Culture NO GROWTH 4 DAYS  Final   Report Status PENDING  Incomplete   Assessment/Plan: Tonishia Steffy is a 44 y.o. female with IDDM, obesity, s/p gastric bypass, multiple allergies, prior granuloma annulare admitted with progressive eruption over R post popliteal area. No fevers, chills, or leukoocytosis. Area has not responded to vanco and clindamycin. No drainage noted so no cultures done.  I do not think this is a simple cellulitis and may be more of a dermatological condition. HIV and Hep C Derm agrees unlikely to infection except perhaps as a superinfection Recommendations Would dc on oral clinda 300 tid for  4 more days Fu derm and PCP  - no need for ID fu unless PCP thinks necessary in future Will sign off. [place], [place]   06/03/2015, 12:32 PM

## 2015-06-03 NOTE — Progress Notes (Signed)
Obstetric and Gynecology  POD/HD # 6  Subjective  Patient states that abscess spontaneously ruptured this morning, mid-morning.  The swelling and pain have become a little less painful.    As for the cellulitis, it appears that dermatitis might be leading the differential at this point.  She states that she has been taken off of antibiotics and will stay off pending blood cultures.  Dermatology has been consulted.     Objective  Objective:   Filed Vitals:   06/02/15 0727 06/02/15 1532 06/03/15 0126 06/03/15 0759  BP: 128/86 101/85 124/72 111/71  Pulse: 84 84 85 76  Temp: 98 F (36.7 C) 97.3 F (36.3 C) 98.4 F (36.9 C) 98.3 F (36.8 C)  TempSrc: Oral Oral Oral Oral  Resp: 12  16 17   Height:      Weight:      SpO2: 95% 97% 95% 98%   Total I/O In: 996 [P.O.:480; I.V.:516] Out: 1075 [Urine:1075]  Intake/Output Summary (Last 24 hours) at 06/03/15 1437 Last data filed at 06/03/15 1124  Gross per 24 hour  Intake   3468 ml  Output   2475 ml  Net    993 ml    General: NAD GU: minimal swelling of the left labia with purulent drainage expressed during exam.  Mildly tender.  Small erythema around area, mildly indurated.    Labs: Results for orders placed or performed during the hospital encounter of 05/29/15 (from the past 24 hour(s))  Glucose, capillary     Status: Abnormal   Collection Time: 06/02/15  4:33 PM  Result Value Ref Range   Glucose-Capillary 179 (H) 65 - 99 mg/dL  Glucose, capillary     Status: None   Collection Time: 06/02/15  9:35 PM  Result Value Ref Range   Glucose-Capillary 76 65 - 99 mg/dL   Comment 1 Notify RN   Creatinine, serum     Status: None   Collection Time: 06/03/15  4:15 AM  Result Value Ref Range   Creatinine, Ser 0.91 0.Maria - 1.00 mg/dL   GFR calc non Af Amer >60 >60 mL/min   GFR calc Af Amer >60 >60 mL/min  Glucose, capillary     Status: Abnormal   Collection Time: 06/03/15  8:00 AM  Result Value Ref Range   Glucose-Capillary 257 (H)  65 - 99 mg/dL  Glucose, capillary     Status: Abnormal   Collection Time: 06/03/15 11:28 AM  Result Value Ref Range   Glucose-Capillary 230 (H) 65 - 99 mg/dL    Cultures: Results for orders placed or performed during the hospital encounter of 05/29/15  Culture, blood (routine x 2)     Status: None (Preliminary result)   Collection Time: 05/29/15  4:30 PM  Result Value Ref Range Status   Specimen Description BLOOD  Final   Special Requests NONE  Final   Culture NO GROWTH 4 DAYS  Final   Report Status PENDING  Incomplete  Culture, blood (routine x 2)     Status: None (Preliminary result)   Collection Time: 05/29/15  8:24 PM  Result Value Ref Range Status   Specimen Description BLOOD  Final   Special Requests NONE  Final   Culture NO GROWTH 4 DAYS  Final   Report Status PENDING  Incomplete    Imaging: No results found.   Assessment   44 y.o. with left bartholins gland abscess, and LE cellulitis and uncontrolled diabetes. Hospital Day: 6   Plan   1. Diabetes/cellulits per  primary team 2. Bartholin's gland abscess:  Is now draining on its own.  Will not perform I&D unless draining stops and increase in pain/swelling returns.  If drainage continues and patient is discharged prior to our team seeing her again, please have her follow up within a week at [facility] with Dr. Larey Days.  If she is still in-house tomorrow, we will see her again.  Will Bonnet, MD Obstetrician and Gynecologist [facility]

## 2015-06-03 NOTE — Progress Notes (Signed)
[facility] NAME: Maria Bentley    MR#:  [id number]  DATE OF BIRTH:  Apr 19, 1971  SUBJECTIVE:  CHIEF COMPLAINT:   Chief Complaint  Patient presents with  . Cellulitis  same redness and tenderness on right leg, feels itchy  REVIEW OF SYSTEMS:  CONSTITUTIONAL: No fever or  chills, fatigue or weakness.  EYES: No blurred or double vision.  EARS, NOSE, AND THROAT: No tinnitus or ear pain.  RESPIRATORY: No cough, shortness of breath, wheezing or hemoptysis.  CARDIOVASCULAR: No chest pain, orthopnea, edema.  GASTROINTESTINAL: No nausea, vomiting, diarrhea or abdominal pain.  GENITOURINARY: No dysuria, hematuria.  ENDOCRINE: No polyuria, nocturia,  HEMATOLOGY: No anemia, easy bruising or bleeding SKIN: No rash or lesion. Right leg erythema is larger and tenderness is worse. MUSCULOSKELETAL: No joint pain or arthritis.   NEUROLOGIC: No tingling, numbness, weakness.  PSYCHIATRY: No anxiety or depression.   DRUG ALLERGIES:   Allergies  Allergen Reactions  . Avocado Swelling  . Sulfa Drugs Cross Reactors Anaphylaxis  . Adhesive [Tape]   . Amoxicillin Hives  . Clarithromycin   . Honey   . Other Hives and Swelling    All melon  . Watermelon Concentrate     VITALS:  Blood pressure 111/71, pulse 76, temperature 98.3 F (36.8 C), temperature source Oral, resp. rate 17, height 5\' 4"  (1.626 m), weight 111.449 kg (245 lb 11.2 oz), last menstrual period 05/11/2015, SpO2 98 %.  PHYSICAL EXAMINATION:  GENERAL:  44 y.o.-year-old patient lying in the bed with no acute distress. Obesity. EYES: Pupils equal, round, reactive to light and accommodation. No scleral icterus. Extraocular muscles intact.  HEENT: Head atraumatic, normocephalic. Oropharynx and nasopharynx clear.  NECK:  Supple, no jugular venous distention. No thyroid enlargement, no tenderness.  LUNGS: Normal breath sounds bilaterally, no wheezing, rales,rhonchi or  crepitation. No use of accessory muscles of respiration.  CARDIOVASCULAR: S1, S2 normal. No murmurs, rubs, or gallops.  ABDOMEN: Soft, nontender, nondistended. Bowel sounds present. No organomegaly or mass.  EXTREMITIES: No pedal edema, cyanosis, or clubbing. Worsened Erythematous nonblanching rash posterior right leg with tenderness. NEUROLOGIC: Cranial nerves II through XII are intact. Muscle strength 5/5 in all extremities. Sensation intact. Gait not checked.  PSYCHIATRIC: The patient is alert and oriented x 3.  SKIN: No obvious rash, lesion, or ulcer. Erythematous nonblanching rash posterior right leg. Left labial abscess 3 cm.   LABORATORY PANEL:   CBC  Recent Labs Lab 05/31/15 0444  WBC 6.7  HGB 10.9*  HCT 35.1  PLT 375   ------------------------------------------------------------------------------------------------------------------  Chemistries   Recent Labs Lab 05/29/15 1708 05/31/15 0444 06/03/15 0415  NA 128* 136  --   K 5.1 4.0  --   CL 97* 106  --   CO2 22 27  --   GLUCOSE 704* 213*  --   BUN 31* 17  --   CREATININE 1.29* 1.00 0.91  CALCIUM 8.9 8.3*  --   AST 14*  --   --   ALT 16  --   --   ALKPHOS 97  --   --   BILITOT 0.7  --   --    ------------------------------------------------------------------------------------------------------------------  Cardiac Enzymes No results for input(s): TROPONINI in the last 168 hours. ------------------------------------------------------------------------------------------------------------------  RADIOLOGY:  No results found.  EKG:   Orders placed or performed in visit on 08/12/13  . Holter monitor - 48 hour    ASSESSMENT AND PLAN:   1.Sepsis,with right lower  extremity cellulitis.  not responded to vanco and clindamycin. Possible dermatitis. discontinue clindamycin, restarted vancomycin per Dr. Ola Spurr. Follow-up dermatologist recommendation. blood culture and wound culture: negative.  2.  Hyperglycemia with Uncontrolled type 2 diabetes: Hemoglobin A1c 12.  Increase NovoLog to 68 units subcutaneous at bedtime, continue novolog 16 units tid AC, follow-up Dr. Gabriel Carina.   continue insulin sliding scale, hold oral agents.  3. Essential hypertension: Continue lisinopril  4. Dehydration. Improved. 5. Hyperlipidemia. On Lipitor.   Left labial cyst.  Warm compresses and sitz bath x 24 hours, follow-up OB/GYN physician.    All the records are reviewed and case discussed with Care Management/Social Workerr. Management plans discussed with the patient,  She is in agreement.  CODE STATUS: Full code   TOTAL TIME TAKING CARE OF THIS PATIENT: 33 minutes.   POSSIBLE D/C IN 1-2 DAYS, DEPENDING ON CLINICAL CONDITION.   Demetrios Loll M.D on 06/03/2015 at 12:46 PM  Between 7am to 6pm - Pager - [phone]  After 6pm go to [email or web address] - password [email or web address] [facility]  Office  [phone]  CC: Primary care physician; [name], MD

## 2015-06-03 NOTE — Progress Notes (Signed)
Maria Bentley is a 44 y.o. female seen in follow up for uncontrolled type 2 diabetes in setting of rt leg cellulitis. She is feeling some better. Is pleased that her rash seems to be responding to the current medical treatment. No N/V/abd pain. Tolerating diet. Eating 100% of meals.  Medical history Past Medical History  Diagnosis Date  . Diabetes mellitus   . Morbid obesity     s/p gastric bypass  . Anxiety   . Headache, common migraine, intractable   . Hyperlipidemia   . Depression 2010  . Anemia   . Rosacea   . Personal hx of gastric bypass 2010  . Vertigo   . Kidney stones     left  . Hypertension      Surgical history Past Surgical History  Procedure Laterality Date  . Gastric bypass    . Dilation and curettage of uterus  2011  . Ovarian cyst removal    . Rotator cuff repair      right     Medications .  0.9 %  sodium chloride infusion, , Intravenous, Continuous, Lytle Butte, MD, Last Rate: 100 mL/hr at 06/03/15 8469 .  acetaminophen (TYLENOL) tablet 650 mg, 650 mg, Oral, Q6H PRN, 650 mg at 06/01/15 1405 **OR** acetaminophen (TYLENOL) suppository 650 mg, 650 mg, Rectal, Q6H PRN, Lytle Butte, MD .  atorvastatin (LIPITOR) tablet 20 mg, 20 mg, Oral, Daily, Lytle Butte, MD, 20 mg at 06/03/15 6295 .  buPROPion (WELLBUTRIN XL) 24 hr tablet 300 mg, 300 mg, Oral, Daily, Lytle Butte, MD, 300 mg at 06/03/15 2841 .  clobetasol cream (TEMOVATE) 0.05 %, , Topical, BID, Ralene Bathe, MD .  fluticasone [facility]) 50 MCG/ACT nasal spray 2 spray, 2 spray, Each Nare, Daily, Lytle Butte, MD, 2 spray at 06/03/15 3244 .  heparin injection 5,000 Units, 5,000 Units, Subcutaneous, 3 times per day, Lytle Butte, MD, 5,000 Units at 06/03/15 0615 .  HYDROcodone-acetaminophen (NORCO) 10-325 MG per tablet 1 tablet, 1 tablet, Oral, Q6H PRN, Lytle Butte, MD, 1 tablet at 06/03/15 [phone] .  hydrOXYzine (ATARAX/VISTARIL) tablet 25 mg, 25 mg, Oral, Q4H PRN, Lytle Butte, MD, 25 mg  at 06/03/15 1136 .  insulin aspart (novoLOG) injection 0-15 Units, 0-15 Units, Subcutaneous, TID WC, Lytle Butte, MD, 5 Units at 06/03/15 1203 .  insulin aspart (novoLOG) injection 0-5 Units, 0-5 Units, Subcutaneous, QHS, Lytle Butte, MD, 5 Units at 05/31/15 2251 .  insulin aspart (novoLOG) injection 16 Units, Subcutaneous, TID WC  .  insulin glargine (LANTUS) injection 65 Units, Subcutaneous, QHS  .  lamoTRIgine (LAMICTAL) tablet 100 mg, 100 mg, Oral, Daily, Demetrios Loll, MD, 100 mg at 06/03/15 0903 .  lisinopril (PRINIVIL,ZESTRIL) tablet 5 mg, 5 mg, Oral, Daily, Lytle Butte, MD, 5 mg at 06/03/15 7253 .  methocarbamol (ROBAXIN) tablet 500 mg, 500 mg, Oral, Q6H PRN, Demetrios Loll, MD, 500 mg at 06/03/15 1136 .  morphine 2 MG/ML injection 2 mg, 2 mg, Intravenous, Q4H PRN, Lytle Butte, MD .  ondansetron [facility]) tablet 4 mg, 4 mg, Oral, Q6H PRN, 4 mg at 05/30/15 0906 **OR** ondansetron (ZOFRAN) injection 4 mg, 4 mg, Intravenous, Q6H PRN, Lytle Butte, MD .  pregabalin (LYRICA) capsule 50 mg, 50 mg, Oral, Daily, Lytle Butte, MD, 50 mg at 06/03/15 6644 .  vancomycin (VANCOCIN) IVPB 1000 mg/200 mL premix, 1,000 mg, Intravenous, Q12H, Jody C Barefoot, RPH, 1,000 mg at 06/03/15 0153  Review of systems CV: no chest pain or palpitations PULM: no cough or shortness of breath ABD: no abdominal pain or nausea or vomiting   Physical Exam BP 111/71 mmHg  Pulse 76  Temp(Src) 98.3 F (36.8 C) (Oral)  Resp 17  Ht 5\' 4"  (1.626 m)  Wt 111.449 kg (245 lb 11.2 oz)  BMI 42.15 kg/m2  SpO2 98%  LMP 05/11/2015  GEN: Obese white female in NAD. HEENT: No proptosis, EOMI, lid lag or stare. Oropharynx is clear.  NECK: supple, trachea midline.   RESPIRATORY: clear bilaterally, no wheeze, good inspiratory effort. SKIN: Erythremic rash to rt post knee appears inflamed with borders outside of marked area CV: No carotid bruits, RRR. ABD: soft, NT/ND, no organomegaly EXT: no peripheral edema PSYC: alert and  oriented, good insight  Assessment 1.Diabetes mellitus, type 2 uncontrolled 2. Cellulitis 3. Morbid obesity  Plan Increase Lantus to 65 units qhs (as ordered)  Continue NovoLog 16 units tid AC  Continue NovoLog SSI as ordered.  If patient is discharged before I re-evaluate, then please discharge on the following medications for diabetes. Her NovoLog SSI will not be continued, however her oral diabetes medications will be restarted. Lantus will not be continued, as she uses Novolin NPH at home and that can be restarted instead. Note that she will need a prescription for the medication with the ** as noted. Novolin N 100 units/ml VIAL 60 units qhs Kombiglyze 2.04/999 bid  glipizide 5 mg bid NovoLog Flex pen 16 units three times daily before meals (15 ml rf0) ** RXN NEEDED Insulin pen needles for use three times daily before meals (#100 rf0) ** RXN NEEDED  Will follow along with you.

## 2015-06-04 ENCOUNTER — Telehealth: Payer: Self-pay | Admitting: Internal Medicine

## 2015-06-04 LAB — CULTURE, BLOOD (ROUTINE X 2)
Culture: NO GROWTH
Culture: NO GROWTH

## 2015-06-04 LAB — GLUCOSE, CAPILLARY: Glucose-Capillary: 229 mg/dL — ABNORMAL HIGH (ref 65–99)

## 2015-06-04 LAB — PLATELET COUNT: Platelets: 402 10*3/uL (ref 150–440)

## 2015-06-04 MED ORDER — ATORVASTATIN CALCIUM 20 MG PO TABS: 20.0000 mg | ORAL_TABLET | Freq: Every day | ORAL | Status: DC

## 2015-06-04 MED ORDER — HYDROXYZINE HCL 25 MG PO TABS: 25.0000 mg | ORAL_TABLET | Freq: Four times a day (QID) | ORAL | Status: DC | PRN

## 2015-06-04 MED ORDER — CLINDAMYCIN HCL 300 MG PO CAPS: 300.0000 mg | ORAL_CAPSULE | Freq: Three times a day (TID) | ORAL | Status: DC

## 2015-06-04 MED ORDER — INSULIN NPH (HUMAN) (ISOPHANE) 100 UNIT/ML ~~LOC~~ SUSP: 60.0000 [IU] | Freq: Every day | SUBCUTANEOUS | Status: DC

## 2015-06-04 MED ORDER — CLOBETASOL PROPIONATE 0.05 % EX CREA: TOPICAL_CREAM | Freq: Two times a day (BID) | CUTANEOUS | Status: DC

## 2015-06-04 MED ORDER — INSULIN ASPART 100 UNIT/ML ~~LOC~~ SOLN: 16.0000 [IU] | Freq: Three times a day (TID) | SUBCUTANEOUS | Status: DC

## 2015-06-04 MED ORDER — SAXAGLIPTIN-METFORMIN ER 2.5-1000 MG PO TB24: ORAL_TABLET | ORAL | Status: DC

## 2015-06-04 NOTE — Discharge Summary (Signed)
Maria Bentley at [place] NAME: Maria Bentley    MR#:  [id number]  DATE OF BIRTH:  July 03, 1971  DATE OF ADMISSION:  05/29/2015 ADMITTING PHYSICIAN: Lytle Butte, MD  DATE OF DISCHARGE: 06/04/2015 11:26 AM  PRIMARY CARE PHYSICIAN: [name], MD    ADMISSION DIAGNOSIS:  Hyperglycemia [R73.9] Cellulitis of right lower extremity [H96.222]   DISCHARGE DIAGNOSIS:  Sepsis,with right lower extremity cellulitis Hyperglycemia with Uncontrolled type 2 diabetes Left Bartholin's gland abcess  SECONDARY DIAGNOSIS:   Past Medical History  Diagnosis Date  . Diabetes mellitus   . Morbid obesity     s/p gastric bypass  . Anxiety   . Headache, common migraine, intractable   . Hyperlipidemia   . Depression 2010  . Anemia   . Rosacea   . Personal hx of gastric bypass 2010  . Vertigo   . Kidney stones     left  . Hypertension     HOSPITAL COURSE:  Maria Bentley is a 44 y.o. female with a known history of poorly controlled type 2 diabetes presenting with cellulitis right leg. She described a day duration reddened area on the back of her right leg. Stateed that it started with a bug bite has since spread, developed erythema, warmth, itching. Saw her PCP for the above symptoms started on doxycycline as well as topical gentamicin. Her symptoms progressed and she was then subsequently started on oral steroids. Despite these above measures still no improvement thus presented to the Hospital further workup and evaluation  Sepsis,with right lower extremity cellulitis on top of dermatitis..  The patient has been treated with vanco and clindamycin, tenderness and swelling are better but is still has erythema, which is larger. The patient was started with the clobetasol cream for possible dermatitis according to dermatologist recommendation. In addition, patient need 4 more days clindamycin per Dr. Ola Spurr.  2. Hyperglycemia with Uncontrolled  type 2 diabetes: Hemoglobin A1c 12.  The patient has been treated with the Lantus which was increased the to 65 units subcutaneous at bedtime with novolog 16 units tid AC meals per Dr. Gabriel Carina. She recommended continuing Novolin N 100 units/ml VIAL 60 units HS, Kombiglyze 2.04/999 bid, glipizide 5 mg bid and NovoLog Flex pen 16 units three times daily before meals after discharge.  3. Left Bartholin's gland abcess. Warm compresses and sitz bath, follow-up OB/GYN physician as outpatient.  DISCHARGE CONDITIONS:  Stable.  CONSULTS OBTAINED:  Treatment Team:  Lytle Butte, MD Judi Cong, MD Aletha Halim, MD Adrian Prows, MD Ralene Bathe, MD  DRUG ALLERGIES:   Allergies  Allergen Reactions  . Avocado Swelling  . Sulfa Drugs Cross Reactors Anaphylaxis  . Adhesive [Tape]   . Amoxicillin Hives  . Clarithromycin   . Honey   . Other Hives and Swelling    All melon  . Watermelon Concentrate     DISCHARGE MEDICATIONS:   Discharge Medication List as of 06/04/2015 10:43 AM    START taking these medications   Details  clindamycin (CLEOCIN) 300 MG capsule Take 1 capsule (300 mg total) by mouth 3 (three) times daily., Starting 06/04/2015, Until Discontinued, Print    clobetasol cream (TEMOVATE) 0.05 % Apply topically 2 (two) times daily., Starting 06/04/2015, Until Discontinued, Print    insulin aspart (NOVOLOG) 100 UNIT/ML injection Inject 16 Units into the skin 3 (three) times daily with meals., Starting 06/04/2015, Until Discontinued, Print      CONTINUE these medications which have  CHANGED   Details  atorvastatin (LIPITOR) 20 MG tablet Take 1 tablet (20 mg total) by mouth daily., Starting 06/04/2015, Until Discontinued, Normal    hydrOXYzine (ATARAX/VISTARIL) 25 MG tablet Take 1 tablet (25 mg total) by mouth every 6 (six) hours as needed for itching., Starting 06/04/2015, Until Discontinued, Print    insulin NPH Human (HUMULIN N) 100 UNIT/ML injection Inject 0.6 mLs (60  Units total) into the skin at bedtime., Starting 06/04/2015, Until Discontinued, Print    Saxagliptin-Metformin (KOMBIGLYZE XR) 2.04-999 MG TB24 take 1 tablet by mouth one time a day may increase to 2 tablets by mouth once a day after 1 to 2 weeks if needed, Print      CONTINUE these medications which have NOT CHANGED   Details  acetaminophen (TYLENOL) 325 MG tablet Take 650 mg by mouth every 6 (six) hours as needed., Until Discontinued, Historical Med    azelastine (ASTELIN) 0.1 % nasal spray Place 2 sprays into both nostrils 2 (two) times daily. Use in each nostril as directed, Until Discontinued, Historical Med    buPROPion (WELLBUTRIN XL) 300 MG 24 hr tablet Take 300 mg by mouth daily., Until Discontinued, Historical Med    EPINEPHrine (EPIPEN 2-PAK) 0.3 mg/0.3 mL SOAJ injection Inject 0.3 mLs (0.3 mg total) into the muscle once. For severe allergic reaction, Starting 08/25/2013, Normal    fluticasone (FLONASE) 50 MCG/ACT nasal spray USE TWO SPRAYS IN EACH NOSTRIL DAILY , Normal    glipiZIDE (GLUCOTROL) 5 MG tablet TAKE ONE TABLET BY MOUTH TWICE DAILY BEFORE MEALS , Normal    hydrochlorothiazide (MICROZIDE) 12.5 MG capsule TAKE ONE CAPSULE BY MOUTH ONE TIME DAILY, Normal    HYDROcodone-acetaminophen (NORCO) 10-325 MG per tablet Take 1 tablet by mouth every 6 (six) hours as needed., Until Discontinued, Historical Med    Insulin Syringe-Needle U-100 (B-D INS SYR ULTRAFINE 1CC/31G) 31G X 5/16" 1 ML MISC 1 Syringe by Does not apply route daily., Starting 10/13/2013, Until Discontinued, Normal    lamoTRIgine (LAMICTAL) 100 MG tablet Take 100 mg by mouth daily., Until Discontinued, Historical Med    Lancet Device MISC Test blood sugar two times daily, Normal    lisinopril (PRINIVIL,ZESTRIL) 5 MG tablet Take 1 tablet (5 mg total) by mouth daily., Starting 08/07/2014, Until Discontinued, Normal    meclizine (ANTIVERT) 25 MG tablet Take 1 tablet (25 mg total) by mouth 3 (three) times daily as  needed., Starting 02/14/2012, Until Discontinued, Print    methocarbamol (ROBAXIN) 500 MG tablet TAKE ONE TABLET BY MOUTH FOUR TIMES DAILY , Normal    norethindrone (MICRONOR,CAMILA,ERRIN) 0.35 MG tablet Take 1 tablet by mouth daily., Until Discontinued, Historical Med    OnabotulinumtoxinA (BOTOX IJ) Inject as directed every 3 (three) months. For migraine, Until Discontinued, Historical Med    ONETOUCH VERIO test strip TEST BLOOD SUGAR TWICE DAILY , Normal    Prenatal Vit-Fe Fumarate-FA (PRENATAL MULTIVITAMIN) TABS Take 1 tablet by mouth daily., Until Discontinued, Historical Med    SUMAtriptan (IMITREX) 100 MG tablet TAKE ONE TABLET BY MOUTH DAILY AS NEEDED, Normal    VENTOLIN HFA 108 (90 BASE) MCG/ACT inhaler INHALE ONE PUFF BY MOUTH EVERY SIX HOURS AS NEEDED FOR WHEEZING , Normal    nystatin (MYCOSTATIN/NYSTOP) 100000 UNIT/GM POWD APPLY TO AFFECTED AREA  TWICE A DAY, Normal    potassium chloride (MICRO-K) 10 MEQ CR capsule TAKE TWO CAPSULES BY MOUTH DAILY, Normal    pregabalin (LYRICA) 50 MG capsule Take 300 mg by mouth at bedtime. , Until  Discontinued, Historical Med    promethazine (PHENERGAN) 25 MG tablet Take 1 tablet (25 mg total) by mouth every 8 (eight) hours as needed., Starting 05/28/2015, Until Discontinued, Normal    traMADol (ULTRAM) 50 MG tablet Take one tablet by mouth every 6 hours  as needed for relief from pain, Print      STOP taking these medications     doxycycline (VIBRA-TABS) 100 MG tablet      gentamicin ointment (GARAMYCIN) 0.1 %      predniSONE (DELTASONE) 20 MG tablet      sulfamethoxazole-trimethoprim (BACTRIM DS,SEPTRA DS) 800-160 MG per tablet          DISCHARGE INSTRUCTIONS:    If you experience worsening of your admission symptoms, develop shortness of breath, life threatening emergency, suicidal or homicidal thoughts you must seek medical attention immediately by calling 911 or calling your MD immediately  if symptoms less severe.  You  Must read complete instructions/literature along with all the possible adverse reactions/side effects for all the Medicines you take and that have been prescribed to you. Take any new Medicines after you have completely understood and accept all the possible adverse reactions/side effects.   Please note  You were cared for by a hospitalist during your hospital stay. If you have any questions about your discharge medications or the care you received while you were in the hospital after you are discharged, you can call the unit and asked to speak with the hospitalist on call if the hospitalist that took care of you is not available. Once you are discharged, your primary care physician will handle any further medical issues. Please note that NO REFILLS for any discharge medications will be authorized once you are discharged, as it is imperative that you return to your primary care physician (or establish a relationship with a primary care physician if you do not have one) for your aftercare needs so that they can reassess your need for medications and monitor your lab values.    Today   SUBJECTIVE   No complaint except the right leg itch and erythema.   VITAL SIGNS:  Blood pressure 141/98, pulse 90, temperature 98 F (36.7 C), temperature source Oral, resp. rate 16, height 5\' 4"  (1.626 m), weight 111.449 kg (245 lb 11.2 oz), last menstrual period 05/11/2015, SpO2 97 %.  I/O:   Intake/Output Summary (Last 24 hours) at 06/04/15 1249 Last data filed at 06/04/15 0900  Gross per 24 hour  Intake   2665 ml  Output   3300 ml  Net   -635 ml    PHYSICAL EXAMINATION:  GENERAL:  44 y.o.-year-old patient lying in the bed with no acute distress. Obese EYES: Pupils equal, round, reactive to light and accommodation. No scleral icterus. Extraocular muscles intact.  HEENT: Head atraumatic, normocephalic. Oropharynx and nasopharynx clear.  NECK:  Supple, no jugular venous distention. No thyroid  enlargement, no tenderness.  LUNGS: Normal breath sounds bilaterally, no wheezing, rales,rhonchi or crepitation. No use of accessory muscles of respiration.  CARDIOVASCULAR: S1, S2 normal. No murmurs, rubs, or gallops.  ABDOMEN: Soft, non-tender, non-distended. Bowel sounds present. No organomegaly or mass.  EXTREMITIES: No pedal edema, cyanosis, or clubbing.  Erythematous nonblanching rash posterior right leg with mild  tenderness. NEUROLOGIC: Cranial nerves II through XII are intact. Muscle strength 5/5 in all extremities. Sensation intact. Gait not checked.  NEUROLOGIC: Cranial nerves II through XII are intact. Muscle strength 5/5 in all extremities. Sensation intact. Gait not checked.  PSYCHIATRIC: The  patient is alert and oriented x 3.  SKIN: No lesion, or ulcer.  Erythematous nonblanching rash posterior right leg  DATA REVIEW:   CBC  Recent Labs Lab 05/31/15 0444 06/04/15 0439  WBC 6.7  --   HGB 10.9*  --   HCT 35.1  --   PLT 375 402    Chemistries   Recent Labs Lab 05/29/15 1708 05/31/15 0444 06/03/15 0415  NA 128* 136  --   K 5.1 4.0  --   CL 97* 106  --   CO2 22 27  --   GLUCOSE 704* 213*  --   BUN 31* 17  --   CREATININE 1.29* 1.00 0.91  CALCIUM 8.9 8.3*  --   AST 14*  --   --   ALT 16  --   --   ALKPHOS 97  --   --   BILITOT 0.7  --   --     Cardiac Enzymes No results for input(s): TROPONINI in the last 168 hours.  Microbiology Results  Results for orders placed or performed during the hospital encounter of 05/29/15  Culture, blood (routine x 2)     Status: None (Preliminary result)   Collection Time: 05/29/15  4:30 PM  Result Value Ref Range Status   Specimen Description BLOOD  Final   Special Requests NONE  Final   Culture NO GROWTH 4 DAYS  Final   Report Status PENDING  Incomplete  Culture, blood (routine x 2)     Status: None (Preliminary result)   Collection Time: 05/29/15  8:24 PM  Result Value Ref Range Status   Specimen Description BLOOD   Final   Special Requests NONE  Final   Culture NO GROWTH 4 DAYS  Final   Report Status PENDING  Incomplete    RADIOLOGY:  No results found.      Management plans discussed with the patient, family and they are in agreement.  CODE STATUS:     Code Status Orders        Start     Ordered   05/29/15 2143  Full code   Continuous     05/29/15 2143      TOTAL TIME TAKING CARE OF THIS PATIENT: 45  minutes.    Demetrios Loll M.D on 06/04/2015 at 12:49 PM  Between 7am to 6pm - Pager - [phone]  After 6pm go to [email or web address] - password [email or web address] [facility]  Office  [phone]  CC: Primary care physician; [name], MD

## 2015-06-04 NOTE — Telephone Encounter (Addendum)
Has appt scheduled 06/08/15. Appt within 48 hour business days, no TCM call needed as long as keeps appt

## 2015-06-04 NOTE — Discharge Instructions (Signed)
Low sodium, low fat and ADA diet. °Activity as tolerated. °

## 2015-06-04 NOTE — Telephone Encounter (Signed)
appoint scheduled

## 2015-06-04 NOTE — Progress Notes (Signed)
Pt A and O x 4. VSS. Pt tolerating diet well. Minimal complaints of pain with no meds needed. Complaints of itching with meds given to control. Pt ambulating around room independently. Pt had IV removed and prescriptions given. Pt given discharge instructions and voiced that she understood with no other questions. Pt left via wheelchair with auxillary.

## 2015-06-04 NOTE — Telephone Encounter (Signed)
Patient discharged from [facility] today.

## 2015-06-06 ENCOUNTER — Encounter: Payer: Self-pay | Admitting: Internal Medicine

## 2015-06-07 ENCOUNTER — Encounter: Payer: Self-pay | Admitting: Internal Medicine

## 2015-06-08 ENCOUNTER — Ambulatory Visit (INDEPENDENT_AMBULATORY_CARE_PROVIDER_SITE_OTHER): Payer: BC Managed Care – PPO | Admitting: Internal Medicine

## 2015-06-08 ENCOUNTER — Telehealth: Payer: Self-pay | Admitting: *Deleted

## 2015-06-08 ENCOUNTER — Encounter: Payer: Self-pay | Admitting: *Deleted

## 2015-06-08 ENCOUNTER — Encounter: Payer: Self-pay | Admitting: Internal Medicine

## 2015-06-08 VITALS — BP 118/80 | HR 116 | Temp 98.3°F | Resp 14 | Ht 64.0 in | Wt 244.1 lb

## 2015-06-08 DIAGNOSIS — E1165 Type 2 diabetes mellitus with hyperglycemia: Secondary | ICD-10-CM | POA: Diagnosis not present

## 2015-06-08 DIAGNOSIS — Z9119 Patient's noncompliance with other medical treatment and regimen: Secondary | ICD-10-CM | POA: Diagnosis not present

## 2015-06-08 DIAGNOSIS — Z91199 Patient's noncompliance with other medical treatment and regimen due to unspecified reason: Secondary | ICD-10-CM | POA: Insufficient documentation

## 2015-06-08 DIAGNOSIS — N751 Abscess of Bartholin's gland: Secondary | ICD-10-CM | POA: Diagnosis not present

## 2015-06-08 DIAGNOSIS — IMO0002 Reserved for concepts with insufficient information to code with codable children: Secondary | ICD-10-CM

## 2015-06-08 DIAGNOSIS — L247 Irritant contact dermatitis due to plants, except food: Secondary | ICD-10-CM

## 2015-06-08 MED ORDER — CLOBETASOL PROPIONATE 0.05 % EX CREA: TOPICAL_CREAM | Freq: Two times a day (BID) | CUTANEOUS | Status: DC

## 2015-06-08 MED ORDER — SUMATRIPTAN SUCCINATE 100 MG PO TABS: 100.0000 mg | ORAL_TABLET | Freq: Every day | ORAL | Status: DC | PRN

## 2015-06-08 MED ORDER — TRAMADOL HCL 50 MG PO TABS: ORAL_TABLET | ORAL | Status: DC

## 2015-06-08 MED ORDER — LISINOPRIL 5 MG PO TABS: 5.0000 mg | ORAL_TABLET | Freq: Every day | ORAL | Status: DC

## 2015-06-08 NOTE — Assessment & Plan Note (Signed)
Continue clobetasol ointment

## 2015-06-08 NOTE — Assessment & Plan Note (Signed)
Uncontrolled due to dietary and medication noncompliance. Diet reviewed.   Adjustments to tid insulin doses and NPH made today . Return in one month.  Lab Results  Component Value Date   HGBA1C 12.0* 05/28/2015

## 2015-06-08 NOTE — Progress Notes (Signed)
Subjective:  Patient ID: Maria Bentley, female    DOB: December 07, 1971  Age: 44 y.o. MRN: [id number]  CC: The primary encounter diagnosis was Diabetes mellitus type 2, uncontrolled. Diagnoses of Noncompliance with diabetes treatment, Contact dermatitis and eczema due to plant, and Bartholin's gland abscess were also pertinent to this visit.  HPI Maria Bentley presents for hospital follow up for cellullitis. She was admitted to [facility] on June 18th with presumed sepsis secondary to cellulitis that failed to respond to outpatient treatment with antibiotics.  She was also treated for uncontrolled DM,  CBG was 704 on admission, and bartholin's gland abscess. She was seen by ID, dermatology, endocrinology and gynecology.  The cause of her cellulitis was determined to be a contact dermatitis to poison ivy. She was discharged next day on clindamycin and topical clobetasol  after treatment with vancomycin did not improve the rash.     2) Fu on DM type poorly controlled.  Regimen was changed in house to 16 units tid using  novolog and 60 units of Humulin N at bedtime  Glipizide and kombiglyze  Were continued .  (Seen by [facility]).  Readings since Saturday  Indicate very high post prandials after lunch,  And elevated fastings .  She has been skpping the morning dose since she has been skipping breakfast.   3)  Bartholins gland abscess has started to drain a little.  Has gyn followup on  Friday      Outpatient Prescriptions Prior to Visit  Medication Sig Dispense Refill  . acetaminophen (TYLENOL) 325 MG tablet Take 650 mg by mouth every 6 (six) hours as needed.    Marland Kitchen atorvastatin (LIPITOR) 20 MG tablet Take 1 tablet (20 mg total) by mouth daily. 30 tablet 0  . azelastine (ASTELIN) 0.1 % nasal spray Place 2 sprays into both nostrils 2 (two) times daily. Use in each nostril as directed    . buPROPion (WELLBUTRIN XL) 300 MG 24 hr tablet Take 300 mg by mouth daily.    . clindamycin (CLEOCIN) 300 MG  capsule Take 1 capsule (300 mg total) by mouth 3 (three) times daily. 12 capsule 0  . EPINEPHrine (EPIPEN 2-PAK) 0.3 mg/0.3 mL SOAJ injection Inject 0.3 mLs (0.3 mg total) into the muscle once. For severe allergic reaction 1 Device 5  . fluticasone (FLONASE) 50 MCG/ACT nasal spray USE TWO SPRAYS IN EACH NOSTRIL DAILY  16 g 3  . glipiZIDE (GLUCOTROL) 5 MG tablet TAKE ONE TABLET BY MOUTH TWICE DAILY BEFORE MEALS  180 tablet 0  . hydrochlorothiazide (MICROZIDE) 12.5 MG capsule TAKE ONE CAPSULE BY MOUTH ONE TIME DAILY 30 capsule 1  . HYDROcodone-acetaminophen (NORCO) 10-325 MG per tablet Take 1 tablet by mouth every 6 (six) hours as needed.    . hydrOXYzine (ATARAX/VISTARIL) 25 MG tablet Take 1 tablet (25 mg total) by mouth every 6 (six) hours as needed for itching. 30 tablet 0  . insulin aspart (NOVOLOG) 100 UNIT/ML injection Inject 16 Units into the skin 3 (three) times daily with meals. 100 mL 0  . insulin NPH Human (HUMULIN N) 100 UNIT/ML injection Inject 0.6 mLs (60 Units total) into the skin at bedtime. 30 mL 0  . Insulin Syringe-Needle U-100 (B-D INS SYR ULTRAFINE 1CC/31G) 31G X 5/16" 1 ML MISC 1 Syringe by Does not apply route daily. 100 each 0  . lamoTRIgine (LAMICTAL) 100 MG tablet Take 100 mg by mouth daily.    [name] Device MISC Test blood sugar two times daily  100 each 6  . meclizine (ANTIVERT) 25 MG tablet Take 1 tablet (25 mg total) by mouth 3 (three) times daily as needed. (Patient taking differently: Take 25 mg by mouth 3 (three) times daily as needed for dizziness. ) 90 tablet 6  . methocarbamol (ROBAXIN) 500 MG tablet TAKE ONE TABLET BY MOUTH FOUR TIMES DAILY  (Patient taking differently: Take one or two tabs four times daily, as needed) 90 tablet 3  . norethindrone (MICRONOR,CAMILA,ERRIN) 0.35 MG tablet Take 1 tablet by mouth daily.    Marland Kitchen nystatin (MYCOSTATIN/NYSTOP) 100000 UNIT/GM POWD APPLY TO AFFECTED AREA  TWICE A DAY 60 g 0  . OnabotulinumtoxinA (BOTOX IJ) Inject as directed  every 3 (three) months. For migraine    . ONETOUCH VERIO test strip TEST BLOOD SUGAR TWICE DAILY  50 each 5  . potassium chloride (MICRO-K) 10 MEQ CR capsule TAKE TWO CAPSULES BY MOUTH DAILY 90 capsule 2  . Prenatal Vit-Fe Fumarate-FA (PRENATAL MULTIVITAMIN) TABS Take 1 tablet by mouth daily.    . promethazine (PHENERGAN) 25 MG tablet Take 1 tablet (25 mg total) by mouth every 8 (eight) hours as needed. 20 tablet 3  . Saxagliptin-Metformin (KOMBIGLYZE XR) 2.04-999 MG TB24 take 1 tablet by mouth one time a day may increase to 2 tablets by mouth once a day after 1 to 2 weeks if needed 60 tablet 0  . VENTOLIN HFA 108 (90 BASE) MCG/ACT inhaler INHALE ONE PUFF BY MOUTH EVERY SIX HOURS AS NEEDED FOR WHEEZING  18 g 3  . clobetasol cream (TEMOVATE) 0.05 % Apply topically 2 (two) times daily. 30 g 0  . lisinopril (PRINIVIL,ZESTRIL) 5 MG tablet Take 1 tablet (5 mg total) by mouth daily. 90 tablet 3  . SUMAtriptan (IMITREX) 100 MG tablet TAKE ONE TABLET BY MOUTH DAILY AS NEEDED 9 tablet 0  . traMADol (ULTRAM) 50 MG tablet Take one tablet by mouth every 6 hours  as needed for relief from pain 90 tablet 1  . pregabalin (LYRICA) 50 MG capsule Take 300 mg by mouth at bedtime.      No facility-administered medications prior to visit.    Review of Systems;  Patient denies headache, fevers, malaise, unintentional weight loss, skin rash, eye pain, sinus congestion and sinus pain, sore throat, dysphagia,  hemoptysis , cough, dyspnea, wheezing, chest pain, palpitations, orthopnea, edema, abdominal pain, nausea, melena, diarrhea, constipation, flank pain, dysuria, hematuria, urinary  Frequency, nocturia, numbness, tingling, seizures,  Focal weakness, Loss of consciousness,  Tremor, insomnia, depression, anxiety, and suicidal ideation.      Objective:  BP 118/80 mmHg  Pulse 116  Temp(Src) 98.3 F (36.8 C) (Oral)  Resp 14  Ht 5\' 4"  (1.626 m)  Wt 244 lb 2 oz (110.734 kg)  BMI 41.88 kg/m2  SpO2 97%  LMP  05/11/2015  BP Readings from Last 3 Encounters:  06/08/15 118/80  06/04/15 141/98  05/28/15 126/78    Wt Readings from Last 3 Encounters:  06/08/15 244 lb 2 oz (110.734 kg)  05/29/15 245 lb 11.2 oz (111.449 kg)  05/28/15 242 lb 12 oz (110.111 kg)    General appearance: alert, cooperative and appears stated age Ears: normal TM's and external ear canals both ears Throat: lips, mucosa, and tongue normal; teeth and gums normal Neck: no adenopathy, no carotid bruit, supple, symmetrical, trachea midline and thyroid not enlarged, symmetric, no tenderness/mass/nodules Back: symmetric, no curvature. ROM normal. No CVA tenderness. Lungs: clear to auscultation bilaterally Heart: regular rate and rhythm, S1, S2 normal, no  murmur, click, rub or gallop Abdomen: soft, non-tender; bowel sounds normal; no masses,  no organomegaly Pulses: 2+ and symmetric Skin: blanching erythem to right posterior thigh, with recent desquamation noted,  No vesicles or pustules  Lymph nodes: Cervical, supraclavicular, and axillary nodes normal.  Lab Results  Component Value Date   HGBA1C 12.0* 05/28/2015   HGBA1C 8.3* 08/28/2014   HGBA1C 8.5* 05/27/2014    Lab Results  Component Value Date   CREATININE 0.91 06/03/2015   CREATININE 1.00 05/31/2015   CREATININE 1.29* 05/29/2015    Lab Results  Component Value Date   WBC 6.7 05/31/2015   HGB 10.9* 05/31/2015   HCT 35.1 05/31/2015   PLT 402 06/04/2015   GLUCOSE 213* 05/31/2015   CHOL 196 05/28/2015   TRIG 183.0* 05/28/2015   HDL 34.10* 05/28/2015   LDLDIRECT 156.5 06/25/2013   LDLCALC 125* 05/28/2015   ALT 16 05/29/2015   AST 14* 05/29/2015   NA 136 05/31/2015   K 4.0 05/31/2015   CL 106 05/31/2015   CREATININE 0.91 06/03/2015   BUN 17 05/31/2015   CO2 27 05/31/2015   TSH 1.91 05/27/2014   HGBA1C 12.0* 05/28/2015   MICROALBUR 3.6* 05/27/2014    No results found.  Assessment & Plan:   Problem List Items Addressed This Visit       Unprioritized   Diabetes mellitus type 2, uncontrolled - Primary    Uncontrolled due to dietary and medication noncompliance. Diet reviewed.   Adjustments to tid insulin doses and NPH made today . Return in one month.  Lab Results  Component Value Date   HGBA1C 12.0* 05/28/2015         Relevant Medications   lisinopril (PRINIVIL,ZESTRIL) 5 MG tablet   Noncompliance with diabetes treatment   Contact dermatitis and eczema due to plant    Continue clobetasol ointment       Bartholin's gland abscess    Continue clindamycin per gyn/ID.  Probiotics advised         I have changed Ms. Watrous's SUMAtriptan. I am also having her maintain her buPROPion, prenatal multivitamin, acetaminophen, meclizine, Lancet Device, potassium chloride, methocarbamol, EPINEPHrine, Insulin Syringe-Needle U-100, pregabalin, OnabotulinumtoxinA (BOTOX IJ), HYDROcodone-acetaminophen, VENTOLIN HFA, ONETOUCH VERIO, fluticasone, glipiZIDE, hydrochlorothiazide, promethazine, azelastine, norethindrone, lamoTRIgine, nystatin, insulin NPH Human, clindamycin, insulin aspart, Saxagliptin-Metformin, hydrOXYzine, atorvastatin, gabapentin, LORazepam, lisinopril, traMADol, and clobetasol cream.  Meds ordered this encounter  Medications  . gabapentin (NEURONTIN) 300 MG capsule    Sig: Take 900 mg by mouth daily.  Marland Kitchen LORazepam (ATIVAN) 0.5 MG tablet    Sig: Take 0.5 mg by mouth daily. ! To 2 tablets per day as needed for anxiety.  Marland Kitchen lisinopril (PRINIVIL,ZESTRIL) 5 MG tablet    Sig: Take 1 tablet (5 mg total) by mouth daily.    Dispense:  90 tablet    Refill:  3  . traMADol (ULTRAM) 50 MG tablet    Sig: Take one tablet by mouth every 6 hours  as needed for relief from pain    Dispense:  90 tablet    Refill:  1  . SUMAtriptan (IMITREX) 100 MG tablet    Sig: Take 1 tablet (100 mg total) by mouth daily as needed. May repeat in 2 hours if headache persists or recurs.    Dispense:  9 tablet    Refill:  2  . clobetasol cream  (TEMOVATE) 0.05 %    Sig: Apply topically 2 (two) times daily.    Dispense:  30 g  Refill:  3    Medications Discontinued During This Encounter  Medication Reason  . lisinopril (PRINIVIL,ZESTRIL) 5 MG tablet Reorder  . traMADol (ULTRAM) 50 MG tablet Reorder  . SUMAtriptan (IMITREX) 100 MG tablet Reorder  . clobetasol cream (TEMOVATE) 0.05 % Reorder    Follow-up: Return in about 3 months (around 09/08/2015) for follow up diabetes.   [name], MD

## 2015-06-08 NOTE — Telephone Encounter (Signed)
Pt here for appt.

## 2015-06-08 NOTE — Telephone Encounter (Signed)
Fax from pharmacy, needing PA for Sumatriptan. Started online, setd pt mychart for more information.

## 2015-06-08 NOTE — Assessment & Plan Note (Signed)
Continue clindamycin per gyn/ID.  Probiotics advised

## 2015-06-08 NOTE — Patient Instructions (Addendum)
DO NOT EAT BANANAS FOR NOW .  THEY ARE TOO HIGH IN [place]!  DO NOT SKIP YOUR MORNING MEAL OR YOUR MORNING INSULIN:   CONTINUE 16 UNITS IF YOU ARE EATING FRUIT OR  CEREAL OR OATMEAL FOR BREAKFAST   GIVE YOURSELF 8 UNITS IF ONLY EATING A HIGH  PROTEIN LOW CARB SHAKE, OR EGGS WITHOUT TOAST :    ATKINS,  PREMIER PROTEIN OR EAS ADVANTAGE  CARB CONTROL   FOR LUNCH INCREASE THE DOSE TO 20 UNITS IS THERE IS ANY STARCH   INCREASE THE EVENING  NPH TO 57 UNITS   WEEKLY UPDATES WILL BE RESPONDED TO

## 2015-06-09 ENCOUNTER — Other Ambulatory Visit: Payer: Self-pay | Admitting: Internal Medicine

## 2015-06-11 ENCOUNTER — Encounter: Payer: Self-pay | Admitting: Internal Medicine

## 2015-06-11 DIAGNOSIS — IMO0002 Reserved for concepts with insufficient information to code with codable children: Secondary | ICD-10-CM

## 2015-06-11 DIAGNOSIS — E1165 Type 2 diabetes mellitus with hyperglycemia: Secondary | ICD-10-CM

## 2015-06-11 NOTE — Telephone Encounter (Signed)
PA completed online, pending response. 

## 2015-06-12 ENCOUNTER — Other Ambulatory Visit: Payer: Self-pay | Admitting: Internal Medicine

## 2015-06-12 MED ORDER — INSULIN ASPART 100 UNIT/ML FLEXPEN: 20.0000 [IU] | PEN_INJECTOR | Freq: Three times a day (TID) | SUBCUTANEOUS | Status: DC

## 2015-06-12 MED ORDER — GLUCOSE BLOOD VI STRP: 1.0000 | ORAL_STRIP | Freq: Three times a day (TID) | Status: DC

## 2015-06-15 ENCOUNTER — Other Ambulatory Visit: Payer: Self-pay | Admitting: *Deleted

## 2015-06-15 MED ORDER — PEN NEEDLES 31G X 6 MM MISC: Status: DC

## 2015-06-15 NOTE — Telephone Encounter (Signed)
Fax from [employer], PA approved through 12/08/15

## 2015-06-15 NOTE — Telephone Encounter (Signed)
See mychart message regarding vitamin d. Pen needle Rx already sent

## 2015-06-16 ENCOUNTER — Other Ambulatory Visit: Payer: Self-pay | Admitting: *Deleted

## 2015-06-16 MED ORDER — MECLIZINE HCL 25 MG PO TABS: 25.0000 mg | ORAL_TABLET | Freq: Three times a day (TID) | ORAL | Status: AC | PRN

## 2015-06-16 MED ORDER — ERGOCALCIFEROL 1.25 MG (50000 UT) PO CAPS: 50000.0000 [IU] | ORAL_CAPSULE | ORAL | Status: DC

## 2015-06-16 NOTE — Telephone Encounter (Signed)
Ok to refill,  Refill sent  

## 2015-06-16 NOTE — Telephone Encounter (Signed)
Ok refill? 

## 2015-06-16 NOTE — Addendum Note (Signed)
Addended by: [name] on: 06/16/2015 06:39 AM   Modules accepted: Orders

## 2015-06-20 ENCOUNTER — Other Ambulatory Visit: Payer: Self-pay | Admitting: Internal Medicine

## 2015-06-21 MED ORDER — "INSULIN SYRINGE/NEEDLE 28G X 1/2"" 1 ML MISC": 1.0000 | Freq: Every day | Status: DC

## 2015-06-21 NOTE — Addendum Note (Signed)
Addended by: [name] on: 06/21/2015 01:30 PM   Modules accepted: Orders

## 2015-07-01 ENCOUNTER — Other Ambulatory Visit: Payer: Self-pay | Admitting: Internal Medicine

## 2015-07-07 LAB — HM DIABETES EYE EXAM

## 2015-07-08 ENCOUNTER — Encounter: Payer: Self-pay | Admitting: Internal Medicine

## 2015-08-13 ENCOUNTER — Other Ambulatory Visit: Payer: Self-pay | Admitting: Internal Medicine

## 2015-08-18 ENCOUNTER — Other Ambulatory Visit: Payer: Self-pay | Admitting: Internal Medicine

## 2015-08-25 ENCOUNTER — Other Ambulatory Visit (HOSPITAL_COMMUNITY)
Admission: RE | Admit: 2015-08-25 | Discharge: 2015-08-25 | Disposition: A | Discharge status: Home / Self Care | Payer: BC Managed Care – PPO | Source: Ambulatory Visit | Attending: Family Medicine | Admitting: Family Medicine

## 2015-08-25 ENCOUNTER — Encounter: Payer: Self-pay | Admitting: Family Medicine

## 2015-08-25 ENCOUNTER — Telehealth: Payer: Self-pay | Admitting: Family Medicine

## 2015-08-25 ENCOUNTER — Ambulatory Visit (INDEPENDENT_AMBULATORY_CARE_PROVIDER_SITE_OTHER): Payer: BC Managed Care – PPO | Admitting: Family Medicine

## 2015-08-25 VITALS — BP 112/78 | HR 105 | Temp 98.6°F | Ht 64.0 in | Wt 242.4 lb

## 2015-08-25 DIAGNOSIS — N760 Acute vaginitis: Secondary | ICD-10-CM | POA: Diagnosis present

## 2015-08-25 DIAGNOSIS — R6883 Chills (without fever): Secondary | ICD-10-CM | POA: Diagnosis not present

## 2015-08-25 DIAGNOSIS — Z113 Encounter for screening for infections with a predominantly sexual mode of transmission: Secondary | ICD-10-CM | POA: Diagnosis present

## 2015-08-25 DIAGNOSIS — R000 Tachycardia, unspecified: Secondary | ICD-10-CM | POA: Diagnosis not present

## 2015-08-25 DIAGNOSIS — Z01419 Encounter for gynecological examination (general) (routine) without abnormal findings: Secondary | ICD-10-CM | POA: Diagnosis not present

## 2015-08-25 DIAGNOSIS — R300 Dysuria: Secondary | ICD-10-CM | POA: Diagnosis not present

## 2015-08-25 LAB — POCT URINALYSIS DIPSTICK
Glucose, UA: >=1000
Leukocytes, UA: NEGATIVE
NITRITE UA: NEGATIVE
PH UA: 5.5
Protein, UA: 30
RBC UA: NEGATIVE
Spec Grav, UA: >=1.03
Urobilinogen, UA: 0.2

## 2015-08-25 LAB — CBC
HCT: 37.7 % (ref 36.0–46.0)
Hemoglobin: 11.8 g/dL — ABNORMAL LOW (ref 12.0–15.0)
MCHC: 31.3 g/dL (ref 30.0–36.0)
MCV: 84.7 fl (ref 78.0–100.0)
PLATELETS: 527 10*3/uL — AB (ref 150.0–400.0)
RBC: 4.45 Mil/uL (ref 3.87–5.11)
RDW: 16.9 % — ABNORMAL HIGH (ref 11.5–15.5)
WBC: 8.3 10*3/uL (ref 4.0–10.5)

## 2015-08-25 LAB — COMPREHENSIVE METABOLIC PANEL
ALT: 32 U/L (ref 0–35)
AST: 22 U/L (ref 0–37)
Albumin: 3.5 g/dL (ref 3.5–5.2)
Alkaline Phosphatase: 94 U/L (ref 39–117)
BILIRUBIN TOTAL: 0.2 mg/dL (ref 0.2–1.2)
BUN: 18 mg/dL (ref 6–23)
CO2: 29 meq/L (ref 19–32)
Calcium: 9.1 mg/dL (ref 8.4–10.5)
Chloride: 100 mEq/L (ref 96–112)
Creatinine, Ser: 0.86 mg/dL (ref 0.40–1.20)
GFR: 75.8 mL/min (ref >60.00)
Glucose, Bld: 271 mg/dL — ABNORMAL HIGH (ref 70–99)
Potassium: 4.6 mEq/L (ref 3.5–5.1)
Sodium: 137 mEq/L (ref 135–145)
Total Protein: 6.6 g/dL (ref 6.0–8.3)

## 2015-08-25 LAB — URINALYSIS, ROUTINE W REFLEX MICROSCOPIC
HGB URINE DIPSTICK: NEGATIVE
Leukocytes, UA: NEGATIVE
Nitrite: NEGATIVE
PH: 5.5 (ref 5.0–8.0)
Specific Gravity, Urine: >=1.03 — AB (ref 1.000–1.030)
Urine Glucose: >=1000 — AB
Urobilinogen, UA: 0.2 (ref 0.0–1.0)

## 2015-08-25 LAB — POCT URINE PREGNANCY: Preg Test, Ur: NEGATIVE

## 2015-08-25 MED ORDER — CIPROFLOXACIN HCL 500 MG PO TABS: 500.0000 mg | ORAL_TABLET | Freq: Two times a day (BID) | ORAL | Status: DC

## 2015-08-25 NOTE — Patient Instructions (Signed)
Nice to meet you. You likely have a urine infection or kidney infection. We will send your urine for culture.  We will check blood work as well today.  I will place you on cipro for this.  If you have worsening symptoms, fevers, chills, abdominal pain, nausea, vomiting, diarrhea, back pain, neck stiffness, headache, numbness, weakness, or feel poorly please seek medical attention.   Pyelonephritis, Adult Pyelonephritis is a kidney infection. In general, there are 2 main types of pyelonephritis:  Infections that come on quickly without any warning (acute pyelonephritis).  Infections that persist for a long period of time (chronic pyelonephritis). CAUSES  Two main causes of pyelonephritis are:  Bacteria traveling from the bladder to the kidney. This is a problem especially in pregnant women. The urine in the bladder can become filled with bacteria from multiple causes, including:  Inflammation of the prostate gland (prostatitis).  Sexual intercourse in females.  Bladder infection (cystitis).  Bacteria traveling from the bloodstream to the tissue part of the kidney. Problems that may increase your risk of getting a kidney infection include:  Diabetes.  Kidney stones or bladder stones.  Cancer.  Catheters placed in the bladder.  Other abnormalities of the kidney or ureter. SYMPTOMS   Abdominal pain.  Pain in the side or flank area.  Fever.  Chills.  Upset stomach.  Blood in the urine (dark urine).  Frequent urination.  Strong or persistent urge to urinate.  Burning or stinging when urinating. DIAGNOSIS  Your caregiver may diagnose your kidney infection based on your symptoms. A urine sample may also be taken. TREATMENT  In general, treatment depends on how severe the infection is.   If the infection is mild and caught early, your caregiver may treat you with oral antibiotics and send you home.  If the infection is more severe, the bacteria may have gotten  into the bloodstream. This will require intravenous (IV) antibiotics and a hospital stay. Symptoms may include:  High fever.  Severe flank pain.  Shaking chills.  Even after a hospital stay, your caregiver may require you to be on oral antibiotics for a period of time.  Other treatments may be required depending upon the cause of the infection. HOME CARE INSTRUCTIONS   Take your antibiotics as directed. Finish them even if you start to feel better.  Make an appointment to have your urine checked to make sure the infection is gone.  Drink enough fluids to keep your urine clear or pale yellow.  Take medicines for the bladder if you have urgency and frequency of urination as directed by your caregiver. SEEK IMMEDIATE MEDICAL CARE IF:   You have a fever or persistent symptoms for more than 2-3 days.  You have a fever and your symptoms suddenly get worse.  You are unable to take your antibiotics or fluids.  You develop shaking chills.  You experience extreme weakness or fainting.  There is no improvement after 2 days of treatment. MAKE SURE YOU:  Understand these instructions.  Will watch your condition.  Will get help right away if you are not doing well or get worse. Document Released: 11/27/2005 Document Revised: 05/28/2012 Document Reviewed: 05/03/2011 [facility] Patient Information 2015 [place], [place]. This information is not intended to replace advice given to you by your health care provider. Make sure you discuss any questions you have with your health care provider.

## 2015-08-25 NOTE — Progress Notes (Signed)
Pre visit review using our clinic review tool, if applicable. No additional management support is needed unless otherwise documented below in the visit note. 

## 2015-08-25 NOTE — Telephone Encounter (Signed)
Called and spoke to the patient regarding her lab work. Advised of glucose of 271. No signs of DKA. Advised to keep a close monitoring on her blood glucose and to inform the office if this is consistent >300 at home. Advised on normal WBC. Advised of elevated platelets. Suspect this is reactive and due to infection. Will need to monitor this on repeat testing to determine if this has come down. Advised to start the antibiotic prescribed earlier today.

## 2015-08-26 LAB — URINE CULTURE: Colony Count: 25000

## 2015-08-27 ENCOUNTER — Telehealth: Payer: Self-pay | Admitting: Family Medicine

## 2015-08-27 DIAGNOSIS — R300 Dysuria: Secondary | ICD-10-CM | POA: Insufficient documentation

## 2015-08-27 LAB — CYTOLOGY - PAP

## 2015-08-27 NOTE — Progress Notes (Signed)
Patient ID: Maria Bentley, female   DOB: Nov 06, 1971, 44 y.o.   MRN: [id number]  Maria Rumps, MD Phone: [phone]  Maria Bentley is a 44 y.o. female who presents today for same day appointment.  Dysuria: patient presents with 4-5 days of dysuria, urgency, frequency, suprapubic pressure, fatigue, nausea, and vomiting. She notes this is how she has felt with prior urine infections. She notes mild low back pain that is at its baseline. She endorses temps to 99.6 F and 100.3 F on 2 occassions. Denies numbness, weakness, saddle anesthesia, incontinence, and history of cancer. She notes mild clear vaginal discharge. No vaginal bleeding. She is sexually active, last was 1-2 weeks ago. No condoms. Denies history of STDs. Denies rash, chest pain, and dyspnea. Notes some fast heart beat, though has had this previously and has been evaluated by cardiology with no apparent explanation per the patient. She notes headache that is at baseline. 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 Is mild in nature. No neck stiffness. Has been seen by neurology for botox for headaches and got off schedule with this. Denies diarrhea and abdominal pain. No joint aches or muscle aches. Notes cbgs have been in the 140-160 range at home. No history of VTE.  PMH: nonsmoker. History of UTI.   ROS see HPI  Objective  Physical Exam Filed Vitals:   08/25/15 1446  BP:   Pulse: 105  Temp:     Physical Exam  Constitutional: She is well-developed, well-nourished, and in no distress.  HENT:  Head: Normocephalic and atraumatic.  Right Ear: External ear normal.  Left Ear: External ear normal.  Mouth/Throat: Oropharynx is clear and moist. No oropharyngeal exudate.  Normal TMs bilaterally  Eyes: Conjunctivae are normal. Pupils are equal, round, and reactive to light.  Neck: Normal range of motion. Neck supple. No thyromegaly present.  Cardiovascular: Normal heart sounds.  Exam reveals  no gallop and no friction rub.   No murmur heard. Tachycardic   Pulmonary/Chest: Effort normal and breath sounds normal. No respiratory distress. She has no wheezes. She has no rales.  Abdominal: Soft. Bowel sounds are normal. She exhibits no distension. There is no tenderness. There is no rebound and no guarding.  Right CVA tenderness  Genitourinary: Uterus normal, cervix normal, right adnexa normal and left adnexa normal. Vaginal discharge (minimal white discharge) found.  No discomfort on bimanual exam, no cervical motion tenderness  Musculoskeletal: She exhibits no edema.  No midline spine tenderness, no muscular back tenderness, no back swelling, no step off  Lymphadenopathy:    She has no cervical adenopathy.  Neurological: She is alert.  CN 2-12 intact, 5/5 strength in bilateral biceps, triceps, grip, quads, hamstrings, plantar and dorsiflexion, sensation to light touch intact in bilateral UE and LE, normal gait, 2+ patellar and brachioradialis reflexes, negative rhomberg and no pronator drift  Skin: Skin is warm and dry. She is not diaphoretic.   EKG: sinus tachycardia, rate 105, no ST or T wave changes  Assessment/Plan: Please see individual problem list.  Dysuria Patient with several days of urinary complaints and fatigue and possible fever. Has CVA tenderness today and notes some suprapubic pressure sensation. Likely this is related to pyelo given the clinical picture, though UA was not convincing for infection. Headache is at baseline and is not new. She has no neck stiffness. Is neurologically intact at this time. Unlikely meningitis or neurological process. Back pain is at baseline and there is no midline tenderness thus  doubt this is the source of her symptoms. Lungs are clear and O2 sat normal with no dyspnea making pulmonary process unlikely. Lack of chest pain, shortness of breath, and normal O2 sat with no history of VTE make VTE an unlikely cause. Could be viral illness such  as the flu leading to these symptoms, though this would seem less likely with urinary complaints. Unlikely PID with no cervical motion TTP and minimal discharge on exam. Doubt intra-abdominal cause given benign abdominal exam. Lack of rash makes cellulitis or abscess an unlikely cause. Tachycardia is sinus and likely related to current illness. Discussed work up with patient. Will send urine for culture and micro. Obtain CMET and CBC. Obtain rapid flu. U preg negative. Discussed back imaging to evaluate further though patient declined at this time. Will obtain GC/chlamydia, trich, yeast, and BV testing. Patient is over all well appearing and in no distress. She is afebrile and mildly tachycardic on EKG with normal BP. Discussed staying well hydrated. Will treat empirically for pyelo with cipro given clinical picture. Advised to monitor blood glucose closely throughout this process and call the office if consistently >300. Given return precautions. Advised on reasons to go to the ED.     Orders Placed This Encounter  Procedures  . Urine Culture  . Urinalysis, Routine w reflex microscopic  . Comp Met (CMET)  . CBC  . POCT Urinalysis Dipstick  . POCT urine pregnancy  . POCT Influenza A/B  . EKG 12-Lead    Meds ordered this encounter  Medications  . ciprofloxacin (CIPRO) 500 MG tablet    Sig: Take 1 tablet (500 mg total) by mouth 2 (two) times daily.    Dispense:  14 tablet    Refill:  0    Maria Bentley

## 2015-08-27 NOTE — Assessment & Plan Note (Addendum)
Patient with several days of urinary complaints and fatigue and possible fever. Has CVA tenderness today and notes some suprapubic pressure sensation. Likely this is related to pyelo given the clinical picture, though UA was not convincing for infection. Headache is at baseline and is not new. She has no neck stiffness. Is neurologically intact at this time. Unlikely meningitis or neurological process. Back pain is at baseline and there is no midline tenderness thus doubt this is the source of her symptoms. Lungs are clear and O2 sat normal with no dyspnea making pulmonary process unlikely. Lack of chest pain, shortness of breath, and normal O2 sat with no history of VTE make VTE an unlikely cause. Could be viral illness such as the flu leading to these symptoms, though this would seem less likely with urinary complaints. Unlikely PID with no cervical motion TTP and minimal discharge on exam. Doubt intra-abdominal cause given benign abdominal exam. Lack of rash makes cellulitis or abscess an unlikely cause. Tachycardia is sinus and likely related to current illness. Discussed work up with patient. Will send urine for culture and micro. Obtain CMET and CBC. Obtain rapid flu. U preg negative. Discussed back imaging to evaluate further though patient declined at this time. Will obtain GC/chlamydia, trich, yeast, and BV testing. Patient is over all well appearing and in no distress. She is afebrile and mildly tachycardic on EKG with normal BP. Discussed staying well hydrated. Will treat empirically for pyelo with cipro given clinical picture. Advised to monitor blood glucose closely throughout this process and call the office if consistently >300. Given return precautions. Advised on reasons to go to the ED.

## 2015-08-27 NOTE — Telephone Encounter (Addendum)
Called patient to discussed urine culture results. Advised that they were negative for urine infection. Patient notes she feels improved at this time. Notes still has mild fatigue intermittently. Notes she has had some intermittent high heart rate that she has had in the past with infection. She notes her back pain is improved and her urinary discomfort is improved. She denies chest pain. She notes some mild heavy breathing with her high heart rate, though denies trouble breathing and shortness of breath. Notes that her breathing is stable and at baseline. She denies palpitations, dyspnea, and chest pain at this time. Discussed that given that she is improved and her urinary complaints have resolved she should finish the course of antibiotics given that her clinical picture was consistent with pyelonephritis. Reports she feels well at this time. Discussed that given that she is at her baseline respiratory status she should continue to monitor this and if she develops further palpitations or develops shortness of breath or chest pain she should seek medical attention immediately. Given return precautions.

## 2015-08-29 LAB — CERVICOVAGINAL ANCILLARY ONLY
Bacterial vaginitis: NEGATIVE
Candida vaginitis: NEGATIVE

## 2015-09-03 ENCOUNTER — Other Ambulatory Visit: Payer: BC Managed Care – PPO

## 2015-09-03 NOTE — Patient Instructions (Signed)
Pt wasn't fasting she said she would come back sometime next week

## 2015-09-03 NOTE — Progress Notes (Signed)
Pt wasn't fasting she said she would come back sometime next week

## 2015-09-06 ENCOUNTER — Other Ambulatory Visit: Payer: Self-pay | Admitting: Internal Medicine

## 2015-09-08 ENCOUNTER — Encounter: Payer: Self-pay | Admitting: Internal Medicine

## 2015-09-08 ENCOUNTER — Ambulatory Visit (INDEPENDENT_AMBULATORY_CARE_PROVIDER_SITE_OTHER): Payer: BC Managed Care – PPO | Admitting: Internal Medicine

## 2015-09-08 VITALS — BP 118/78 | HR 116 | Temp 98.2°F | Resp 14 | Ht 64.0 in | Wt 259.1 lb

## 2015-09-08 DIAGNOSIS — IMO0002 Reserved for concepts with insufficient information to code with codable children: Secondary | ICD-10-CM

## 2015-09-08 DIAGNOSIS — Z23 Encounter for immunization: Secondary | ICD-10-CM

## 2015-09-08 DIAGNOSIS — E785 Hyperlipidemia, unspecified: Secondary | ICD-10-CM | POA: Diagnosis not present

## 2015-09-08 DIAGNOSIS — E1165 Type 2 diabetes mellitus with hyperglycemia: Secondary | ICD-10-CM

## 2015-09-08 DIAGNOSIS — G4733 Obstructive sleep apnea (adult) (pediatric): Secondary | ICD-10-CM | POA: Diagnosis not present

## 2015-09-08 DIAGNOSIS — F5102 Adjustment insomnia: Secondary | ICD-10-CM

## 2015-09-08 MED ORDER — FUROSEMIDE 20 MG PO TABS: 20.0000 mg | ORAL_TABLET | Freq: Every day | ORAL | Status: DC

## 2015-09-08 NOTE — Progress Notes (Signed)
Pre-visit discussion using our clinic review tool. No additional management support is needed unless otherwise documented below in the visit note.  

## 2015-09-08 NOTE — Progress Notes (Addendum)
Subjective:  Patient ID: Maria Bentley, female    DOB: 1971-11-11  Age: 44 y.o. MRN: [id number]  CC: The primary encounter diagnosis was Type II diabetes mellitus, uncontrolled. Diagnoses of Encounter for immunization, Need for prophylactic vaccination against Streptococcus pneumoniae (pneumococcus), Diabetes mellitus type 2, uncontrolled, OSA (obstructive sleep apnea), Insomnia due to psychological stress, and Hyperlipidemia were also pertinent to this visit.  HPI Maria Bentley presents for follow up on uncontrolled DM secondary to noncompliance and obesity, recurrent /p gastric bypass.   She has had a significant weight gain of 17 lbs in the past 2 weeks,  A total of 78 lbs since she reached her nadir after Surgery.  Her Body mass index is 44.46 kg/(m^2).  She states that she has been overeating due to a stress reaction to her mother's critical illness, and increasing stressors atwork due to the time she has missed being with her mother.  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   She endorses significant fatigue compounded by chronic insomnia,.  She sates she is averaging 2 hours per night,  Has not slept  more than 4 hours per night, and is  taking lorazepam 0.5 up to two daily  For anxiety BUT NOT FOR SLEEP.  All psychoactive meds are prescribed by her psychiatrist , for histoyr fo MDD , recurrent,  And GAD. including wellbutrin and lamictal .  Trazodone  And lyrica at night also .  HAS a history of  UNTREATED SLEEP APNEA due to mask intolerance/   Her migraine headaches are occurring more frequently and with greater severity because she missed her monthly  botox injection  For the past 2 months but has resumed them now.    She has constant back pain. ng   DM:  Her blood sugars have been labile, and for the last 2 weeks she has had  lows to 45  Occuring around 4 pm or after dinner.  She is  Taking Humulin N 63 units at bedtime and 20 units humalog before each  meal unless she skips a meal which is happening 4 or 5 times per week. Her last A1c was 12.0 in June.   Lab Results  Component Value Date   HGBA1C 9.2* 09/08/2015      Has not been retested for sleep apnea since the weight gain .Never tolerated tany  nasal pillows or mask despite multiple trials.  Outpatient Prescriptions Prior to Visit  Medication Sig Dispense Refill  . acetaminophen (TYLENOL) 325 MG tablet Take 650 mg by mouth every 6 (six) hours as needed.    Marland Kitchen azelastine (ASTELIN) 0.1 % nasal spray Place 2 sprays into both nostrils 2 (two) times daily. Use in each nostril as directed    . clobetasol cream (TEMOVATE) 0.05 % Apply topically 2 (two) times daily. 30 g 3  . EPINEPHrine (EPIPEN 2-PAK) 0.3 mg/0.3 mL SOAJ injection Inject 0.3 mLs (0.3 mg total) into the muscle once. For severe allergic reaction 1 Device 5  . fluticasone (FLONASE) 50 MCG/ACT nasal spray USE TWO SPRAYS IN EACH NOSTRIL DAILY 16 g 3  . gabapentin (NEURONTIN) 300 MG capsule Take 900 mg by mouth daily.    Marland Kitchen glipiZIDE (GLUCOTROL) 5 MG tablet TAKE 1 TABLE BY MOUTH TWICE DAILY BEFORE MEALS. 180 tablet 0  . hydrochlorothiazide (MICROZIDE) 12.5 MG capsule TAKE ONE CAPSULE BY MOUTH ONE TIME DAILY 30 capsule 1  . hydrOXYzine (ATARAX/VISTARIL) 25 MG tablet Take 1 tablet (25  mg total) by mouth every 6 (six) hours as needed for itching. 30 tablet 0  . INS SYRINGE/NEEDLE 1CC/28G (B-D INSULIN SYRINGE 1CC/28G) 28G X 1/2" 1 ML MISC 1 Syringe by Does not apply route daily after supper. 100 each 0  . insulin aspart (NOVOLOG FLEXPEN) 100 UNIT/ML FlexPen Inject 20 Units into the skin 3 (three) times daily with meals. 30 mL 11  . insulin NPH Human (HUMULIN N) 100 UNIT/ML injection Inject 0.6 mLs (60 Units total) into the skin at bedtime. 30 mL 0  . Insulin Pen Needle (PEN NEEDLES) 31G X 6 MM MISC Use with Novolog Flexpen as directed 100 each 5  . Insulin Syringe-Needle U-100 (B-D INS SYR ULTRAFINE 1CC/31G) 31G X 5/16" 1 ML MISC 1  Syringe by Does not apply route daily. 100 each 0  . KOMBIGLYZE XR 2.04-999 MG TB24 TAKE ONE TABLET BY MOUTH ONCE DAILY, MAY INCREASE TO TWO TABLETS BY MOUTH ONCE DAILY AFTER ONE TO TW 60 tablet 5  . lamoTRIgine (LAMICTAL) 100 MG tablet Take 100 mg by mouth daily.    [name] Device MISC Test blood sugar two times daily 100 each 6  . lisinopril (PRINIVIL,ZESTRIL) 5 MG tablet Take 1 tablet (5 mg total) by mouth daily. 90 tablet 3  . LORazepam (ATIVAN) 0.5 MG tablet Take 0.5 mg by mouth daily. ! To 2 tablets per day as needed for anxiety.    . meclizine (ANTIVERT) 25 MG tablet Take 1 tablet (25 mg total) by mouth 3 (three) times daily as needed for dizziness. 90 tablet 6  . methocarbamol (ROBAXIN) 500 MG tablet TAKE ONE TABLET BY MOUTH FOUR TIMES DAILY  (Patient taking differently: Take one or two tabs four times daily, as needed) 90 tablet 3  . norethindrone (MICRONOR,CAMILA,ERRIN) 0.35 MG tablet Take 1 tablet by mouth daily.    Marland Kitchen nystatin (MYCOSTATIN/NYSTOP) 100000 UNIT/GM POWD APPLY TO AFFECTED AREA TWICE A DAY 60 g 0  . OnabotulinumtoxinA (BOTOX IJ) Inject as directed every 3 (three) months. For migraine    . ONETOUCH VERIO test strip TEST BLOOD SUGAR TWICE DAILY 100 each 5  . potassium chloride (MICRO-K) 10 MEQ CR capsule TAKE TWO CAPSULES BY MOUTH DAILY 90 capsule 2  . pregabalin (LYRICA) 50 MG capsule Take 300 mg by mouth at bedtime.     . Prenatal Vit-Fe Fumarate-FA (PRENATAL MULTIVITAMIN) TABS Take 1 tablet by mouth daily.    . promethazine (PHENERGAN) 25 MG tablet TAKE ONE TABLET BY MOUTH EVERY EIGHT HOURS AS NEEDED 20 tablet 4  . SUMAtriptan (IMITREX) 100 MG tablet Take 1 tablet (100 mg total) by mouth daily as needed. May repeat in 2 hours if headache persists or recurs. 9 tablet 2  . traMADol (ULTRAM) 50 MG tablet Take one tablet by mouth every 6 hours  as needed for relief from pain 90 tablet 1  . VENTOLIN HFA 108 (90 BASE) MCG/ACT inhaler INHALE ONE PUFF BY MOUTH EVERY SIX HOURS AS  NEEDED FOR WHEEZING  18 g 3  . Vitamin D, Ergocalciferol, (DRISDOL) 50000 UNITS CAPS capsule TAKE 1 CAPSULE BY MOUTH ONCE A WEEK. 12 capsule 0  . atorvastatin (LIPITOR) 20 MG tablet Take 1 tablet (20 mg total) by mouth daily. (Patient not taking: Reported on 09/08/2015) 30 tablet 0  . buPROPion (WELLBUTRIN XL) 300 MG 24 hr tablet Take 300 mg by mouth daily.    . ciprofloxacin (CIPRO) 500 MG tablet Take 1 tablet (500 mg total) by mouth 2 (two) times daily. [place]  tablet 0  . clindamycin (CLEOCIN) 300 MG capsule Take 1 capsule (300 mg total) by mouth 3 (three) times daily. 12 capsule 0  . HYDROcodone-acetaminophen (NORCO) 10-325 MG per tablet Take 1 tablet by mouth every 6 (six) hours as needed.     No facility-administered medications prior to visit.    Review of Systems;  Patient denies headache, fevers, malaise, unintentional weight loss, skin rash, eye pain, sinus congestion and sinus pain, sore throat, dysphagia,  hemoptysis , cough, dyspnea, wheezing, chest pain, palpitations, orthopnea, edema, abdominal pain, nausea, melena, diarrhea, constipation, flank pain, dysuria, hematuria, urinary  Frequency, nocturia, numbness, tingling, seizures,  Focal weakness, Loss of consciousness,  Tremor, insomnia, depression, anxiety, and suicidal ideation.      Objective:  BP 118/78 mmHg  Pulse 116  Temp(Src) 98.2 F (36.8 C) (Oral)  Resp 14  Ht 5\' 4"  (1.626 m)  Wt 259 lb 2 oz (117.538 kg)  BMI 44.46 kg/m2  SpO2 97%  LMP 07/19/2015  BP Readings from Last 3 Encounters:  09/08/15 118/78  08/25/15 112/78  06/08/15 118/80    Wt Readings from Last 3 Encounters:  09/08/15 259 lb 2 oz (117.538 kg)  08/25/15 242 lb 6.4 oz (109.952 kg)  06/08/15 244 lb 2 oz (110.734 kg)    General appearance: alert, morbidly obese , cooperative and appears stated age Ears: normal TM's and external ear canals both ears Throat: lips, mucosa, and tongue normal; teeth and gums normal Neck: no adenopathy, no carotid  bruit, supple, symmetrical, trachea midline and thyroid not enlarged, symmetric, no tenderness/mass/nodules Back: symmetric, no curvature. ROM normal. No CVA tenderness. Lungs: clear to auscultation bilaterally Heart: regular rate and rhythm, S1, S2 normal, no murmur, click, rub or gallop Abdomen: soft, non-tender; bowel sounds normal; no masses,  no organomegaly Pulses: 2+ and symmetric Skin: Skin color, texture, turgor normal. No rashes or lesions Lymph nodes: Cervical, supraclavicular, and axillary nodes normal.  Lab Results  Component Value Date   HGBA1C 9.2* 09/08/2015   HGBA1C 12.0* 05/28/2015   HGBA1C 8.3* 08/28/2014    Lab Results  Component Value Date   CREATININE 0.86 08/25/2015   CREATININE 0.91 06/03/2015   CREATININE 1.00 05/31/2015    Lab Results  Component Value Date   WBC 8.3 08/25/2015   HGB 11.8* 08/25/2015   HCT 37.7 08/25/2015   PLT 527.0* 08/25/2015   GLUCOSE 271* 08/25/2015   CHOL 196 05/28/2015   TRIG 183.0* 05/28/2015   HDL 34.10* 05/28/2015   LDLDIRECT 177.0 09/08/2015   LDLCALC 125* 05/28/2015   ALT 32 08/25/2015   AST 22 08/25/2015   NA 137 08/25/2015   K 4.6 08/25/2015   CL 100 08/25/2015   CREATININE 0.86 08/25/2015   BUN 18 08/25/2015   CO2 29 08/25/2015   TSH 1.91 05/27/2014   HGBA1C 9.2* 09/08/2015   MICROALBUR <0.7 09/08/2015    No results found.  Assessment & Plan:   Problem List Items Addressed This Visit    Hyperlipidemia    LDL and triglycerides are not  goal ; she has stopped taking atorvastatin. Based on current lipid profile, the risk of clinically significant CAD is 9% over the next 10 years, using the Framingham risk calculator. The [employer] recommends starting patients aged 9 or higher on moderate intensity statin therapy for LDL between 70-189 and 10 yr risk of CAD > 7.5% ;  Will  resume atorvastatin   Lab Results  Component Value Date   CHOL 196 05/28/2015  HDL 34.10* 05/28/2015   LDLCALC  125* 05/28/2015   LDLDIRECT 177.0 09/08/2015   TRIG 183.0* 05/28/2015   CHOLHDL 6 05/28/2015   Lab Results  Component Value Date   ALT 32 08/25/2015   AST 22 08/25/2015   ALKPHOS 94 08/25/2015   BILITOT 0.2 08/25/2015            Relevant Medications   furosemide (LASIX) 20 MG tablet   atorvastatin (LIPITOR) 20 MG tablet   OSA (obstructive sleep apnea)    diagnosed with prior sleep study but treatment has been deferred  by patient due to patient intolerance of all masks. .  Discussed the long term history of OSA , the risks of long term damage to heart and the signs and symptoms attributable to OSA.  Advised patient to consider  significant weight loss and/or use of CPAP.       Diabetes mellitus type 2, uncontrolled ([place])     diabetes  Control is improving but not yet controlled. Asked to increase evening NPH  insulin dose to 66 units and reduce pre meal insulin to 15 units unless pre meal level is > 150,  And  increase by 3 units for every 50 pts above 150 if her meal is going to contain any starch.   Lab Results  Component Value Date   HGBA1C 9.2* 09/08/2015   Lab Results  Component Value Date   MICROALBUR <0.7 09/08/2015   Lab Results  Component Value Date   CREATININE 0.86 08/25/2015         Relevant Medications   atorvastatin (LIPITOR) 20 MG tablet   Insomnia due to psychological stress    Despite taking trazodone and lamictal.  Because she has untreated OSA, I advised her NOT to ad lorazepam.  Advised to turn off all blue screened devices 2 or more hours before bedtime and follow up with her psychiatrist ASAP/        Other Visit Diagnoses    Type II diabetes mellitus, uncontrolled    -  Primary    Relevant Medications    atorvastatin (LIPITOR) 20 MG tablet    Other Relevant Orders    Hemoglobin A1c (Completed)    LDL cholesterol, direct (Completed)    Microalbumin / creatinine urine ratio (Completed)    Encounter for immunization        Need for  prophylactic vaccination against Streptococcus pneumoniae (pneumococcus)        Relevant Orders    Pneumococcal polysaccharide vaccine 23-valent greater than or equal to 2yo subcutaneous/IM (Completed)      A total of 40 minutes was spent with patient more than half of which was spent in counseling patient on the above mentioned issues , reviewing and explaining recent labs and imaging studies done, and coordination of care. I have discontinued Ms. Corron's HYDROcodone-acetaminophen, clindamycin, atorvastatin, and ciprofloxacin. I am also having her start on furosemide and atorvastatin. Additionally, I am having her maintain her prenatal multivitamin, acetaminophen, Lancet Device, potassium chloride, methocarbamol, EPINEPHrine, Insulin Syringe-Needle U-100, pregabalin, OnabotulinumtoxinA (BOTOX IJ), VENTOLIN HFA, azelastine, norethindrone, lamoTRIgine, insulin NPH Human, hydrOXYzine, gabapentin, LORazepam, lisinopril, traMADol, SUMAtriptan, clobetasol cream, KOMBIGLYZE XR, fluticasone, promethazine, insulin aspart, Pen Needles, meclizine, glipiZIDE, INS SYRINGE/NEEDLE 1CC/28G, ONETOUCH VERIO, hydrochlorothiazide, nystatin, Vitamin D (Ergocalciferol), buPROPion, and oxyCODONE-acetaminophen.  Meds ordered this encounter  Medications  . buPROPion (WELLBUTRIN XL) 300 MG 24 hr tablet    Sig: Take 300 mg by mouth daily.  Marland Kitchen oxyCODONE-acetaminophen (PERCOCET) 10-325 MG tablet    Sig:  Take 1 tablet by mouth every 6 (six) hours as needed for pain.  . furosemide (LASIX) 20 MG tablet    Sig: Take 1 tablet (20 mg total) by mouth daily.    Dispense:  30 tablet    Refill:  3  . atorvastatin (LIPITOR) 20 MG tablet    Sig: Take 1 tablet (20 mg total) by mouth daily.    Dispense:  90 tablet    Refill:  3    Medications Discontinued During This Encounter  Medication Reason  . buPROPion (WELLBUTRIN XL) 300 MG 24 hr tablet Patient Preference  . ciprofloxacin (CIPRO) 500 MG tablet Completed Course  .  clindamycin (CLEOCIN) 300 MG capsule Completed Course  . HYDROcodone-acetaminophen (NORCO) 10-325 MG per tablet Change in therapy  . atorvastatin (LIPITOR) 20 MG tablet   . clindamycin (CLEOCIN) 300 MG capsule Completed Course  . buPROPion (WELLBUTRIN XL) 300 MG 24 hr tablet Patient Preference  . HYDROcodone-acetaminophen (NORCO) 10-325 MG per tablet Change in therapy  . ciprofloxacin (CIPRO) 500 MG tablet Completed Course    Follow-up: No Follow-up on file.   [name], MD

## 2015-09-08 NOTE — Patient Instructions (Addendum)
Continue the NPH insulin at bedtime no more than 66 if snacking  Reduce the novalog at lunch/dinner to 15 units  USE A KINDLE OR A BOOK,  NOT AN I PAD OR IPHONE BEFORE BED.  IT IS TOO ACTIVATING

## 2015-09-09 ENCOUNTER — Encounter: Payer: Self-pay | Admitting: Internal Medicine

## 2015-09-09 DIAGNOSIS — F5102 Adjustment insomnia: Secondary | ICD-10-CM | POA: Insufficient documentation

## 2015-09-09 LAB — MICROALBUMIN / CREATININE URINE RATIO
CREATININE, U: 83.8 mg/dL
MICROALB/CREAT RATIO: 0.8 mg/g (ref 0.0–30.0)

## 2015-09-09 LAB — LDL CHOLESTEROL, DIRECT: LDL DIRECT: 177 mg/dL

## 2015-09-09 LAB — HEMOGLOBIN A1C: Hgb A1c MFr Bld: 9.2 % — ABNORMAL HIGH (ref 4.6–6.5)

## 2015-09-09 NOTE — Assessment & Plan Note (Addendum)
LDL and triglycerides are not  goal ; she has stopped taking atorvastatin. Based on current lipid profile, the risk of clinically significant CAD is 9% over the next 10 years, using the Framingham risk calculator. The [employer] recommends starting patients aged 45 or higher on moderate intensity statin therapy for LDL between 70-189 and 10 yr risk of CAD > 7.5% ;  Will  resume atorvastatin   Lab Results  Component Value Date   CHOL 196 05/28/2015   HDL 34.10* 05/28/2015   LDLCALC 125* 05/28/2015   LDLDIRECT 177.0 09/08/2015   TRIG 183.0* 05/28/2015   CHOLHDL 6 05/28/2015   Lab Results  Component Value Date   ALT 32 08/25/2015   AST 22 08/25/2015   ALKPHOS 94 08/25/2015   BILITOT 0.2 08/25/2015

## 2015-09-09 NOTE — Assessment & Plan Note (Signed)
Despite taking trazodone and lamictal.  Because she has untreated OSA, I advised her NOT to ad lorazepam.  Advised to turn off all blue screened devices 2 or more hours before bedtime and follow up with her psychiatrist ASAP/

## 2015-09-09 NOTE — Assessment & Plan Note (Signed)
diagnosed with prior sleep study but treatment has been deferred  by patient due to patient intolerance of all masks. .  Discussed the long term history of OSA , the risks of long term damage to heart and the signs and symptoms attributable to OSA.  Advised patient to consider  significant weight loss and/or use of CPAP.

## 2015-09-09 NOTE — Assessment & Plan Note (Addendum)
diabetes  Control is improving but not yet controlled. Asked to increase evening NPH  insulin dose to 66 units and reduce pre meal insulin to 15 units unless pre meal level is > 150,  And  increase by 3 units for every 50 pts above 150 if her meal is going to contain any starch.   Lab Results  Component Value Date   HGBA1C 9.2* 09/08/2015   Lab Results  Component Value Date   MICROALBUR <0.7 09/08/2015   Lab Results  Component Value Date   CREATININE 0.86 08/25/2015

## 2015-09-12 MED ORDER — ATORVASTATIN CALCIUM 20 MG PO TABS: 20.0000 mg | ORAL_TABLET | Freq: Every day | ORAL | Status: DC

## 2015-09-12 NOTE — Addendum Note (Signed)
Addended by: [name] on: 09/12/2015 08:09 PM   Modules accepted: Orders

## 2015-09-13 ENCOUNTER — Other Ambulatory Visit: Payer: Self-pay | Admitting: Internal Medicine

## 2015-09-13 MED ORDER — SYRINGE (DISPOSABLE) 3 ML MISC: Status: DC

## 2015-09-13 MED ORDER — CYANOCOBALAMIN 1000 MCG/ML IJ SOLN: 1000.0000 ug | Freq: Once | INTRAMUSCULAR | Status: DC

## 2015-09-18 ENCOUNTER — Other Ambulatory Visit: Payer: Self-pay | Admitting: Internal Medicine

## 2015-09-27 NOTE — Telephone Encounter (Signed)
Mailed unread message to patient.  

## 2015-10-10 ENCOUNTER — Other Ambulatory Visit: Payer: Self-pay | Admitting: Internal Medicine

## 2015-10-21 ENCOUNTER — Other Ambulatory Visit: Payer: Self-pay | Admitting: Internal Medicine

## 2015-10-22 NOTE — Telephone Encounter (Signed)
Ok to refill,  printed rx  

## 2015-10-23 ENCOUNTER — Other Ambulatory Visit: Payer: Self-pay | Admitting: Internal Medicine

## 2015-10-26 ENCOUNTER — Encounter: Payer: Self-pay | Admitting: Internal Medicine

## 2015-10-28 ENCOUNTER — Other Ambulatory Visit: Payer: Self-pay | Admitting: Internal Medicine

## 2015-10-28 MED ORDER — METFORMIN HCL ER (MOD) 1000 MG PO TB24: 2000.0000 mg | ORAL_TABLET | Freq: Every day | ORAL | Status: DC

## 2015-10-28 MED ORDER — SAXAGLIPTIN HCL 5 MG PO TABS: 5.0000 mg | ORAL_TABLET | Freq: Every day | ORAL | Status: DC

## 2015-11-05 ENCOUNTER — Other Ambulatory Visit: Payer: Self-pay | Admitting: Internal Medicine

## 2015-11-21 ENCOUNTER — Other Ambulatory Visit: Payer: Self-pay | Admitting: Internal Medicine

## 2015-12-13 ENCOUNTER — Other Ambulatory Visit: Payer: Self-pay | Admitting: Nurse Practitioner

## 2015-12-15 ENCOUNTER — Other Ambulatory Visit: Payer: Self-pay | Admitting: Internal Medicine

## 2015-12-27 ENCOUNTER — Ambulatory Visit (INDEPENDENT_AMBULATORY_CARE_PROVIDER_SITE_OTHER): Payer: BC Managed Care – PPO | Admitting: Internal Medicine

## 2015-12-27 VITALS — BP 130/90 | HR 116 | Temp 98.1°F | Resp 12 | Ht 64.0 in | Wt 268.2 lb

## 2015-12-27 DIAGNOSIS — E559 Vitamin D deficiency, unspecified: Secondary | ICD-10-CM

## 2015-12-27 DIAGNOSIS — Z9884 Bariatric surgery status: Secondary | ICD-10-CM

## 2015-12-27 DIAGNOSIS — Z794 Long term (current) use of insulin: Secondary | ICD-10-CM

## 2015-12-27 DIAGNOSIS — M25552 Pain in left hip: Secondary | ICD-10-CM

## 2015-12-27 DIAGNOSIS — M25571 Pain in right ankle and joints of right foot: Secondary | ICD-10-CM

## 2015-12-27 DIAGNOSIS — Z9889 Other specified postprocedural states: Secondary | ICD-10-CM

## 2015-12-27 DIAGNOSIS — K5900 Constipation, unspecified: Secondary | ICD-10-CM | POA: Insufficient documentation

## 2015-12-27 DIAGNOSIS — K5909 Other constipation: Secondary | ICD-10-CM

## 2015-12-27 DIAGNOSIS — Z91199 Patient's noncompliance with other medical treatment and regimen due to unspecified reason: Secondary | ICD-10-CM

## 2015-12-27 DIAGNOSIS — E1100 Type 2 diabetes mellitus with hyperosmolarity without nonketotic hyperglycemic-hyperosmolar coma (NKHHC): Secondary | ICD-10-CM | POA: Diagnosis not present

## 2015-12-27 DIAGNOSIS — G8929 Other chronic pain: Secondary | ICD-10-CM

## 2015-12-27 DIAGNOSIS — R300 Dysuria: Secondary | ICD-10-CM

## 2015-12-27 DIAGNOSIS — R7309 Other abnormal glucose: Secondary | ICD-10-CM

## 2015-12-27 DIAGNOSIS — R81 Glycosuria: Secondary | ICD-10-CM

## 2015-12-27 DIAGNOSIS — Z9119 Patient's noncompliance with other medical treatment and regimen: Secondary | ICD-10-CM

## 2015-12-27 LAB — POCT URINALYSIS DIPSTICK
BILIRUBIN UA: NEGATIVE
Glucose, UA: >=1000
KETONES UA: NEGATIVE
LEUKOCYTES UA: NEGATIVE
Nitrite, UA: NEGATIVE
Protein, UA: NEGATIVE
Spec Grav, UA: 1.01
Urobilinogen, UA: 0.2
pH, UA: 5

## 2015-12-27 MED ORDER — LACTULOSE 20 GM/30ML PO SOLN: ORAL | Status: DC

## 2015-12-27 NOTE — Progress Notes (Signed)
Subjective:  Patient ID: Maria Bentley, female    DOB: [birth year]  Age: 45 y.o. MRN: [id number]  CC: The primary encounter diagnosis was Dysuria. Diagnoses of Morbid obesity due to excess calories ([place]), History of gastric bypass, Vitamin D deficiency, Uncontrolled type 2 diabetes mellitus with hyperosmolarity without coma, with long-term current use of insulin ([place]), Hip pain, chronic, left, Pain in joint, ankle and foot, right, Other constipation, Elevated serum glucose with glucosuria, and Noncompliance with diabetes treatment were also pertinent to this visit.  HPI Maria Bentley presents for  follow up on multiple issues:  1) Urinary frequency and urgency:   UA  Today shows glucose and blood, no Nitrites or leukocytes.  menses started on Friday after 6 months of amenorrhea.  She recently stopped progestin OCP prescribed by [place] [facility] ( Dr Leonides Schanz)   2) chronic left  hip pain:  Present for 5 years. started after a [place] walk [place] the beach,  Attributed to strain of hip flexors or IT band, would resolve periodically. ,  But has now been present ofr several months without change.  currenltly  limiting her walking . Can't walk to grocery store without sever pain.  No prior evaluation by radiology or orthopedics.   3) right foot pain, lateral side.  Pain is focused over the plantar aspect of the 5th MT head and started 4 months ago after wearing a pair of old worn tennis shoes but did not improve with change in shoes. The pais is present with weight bearing but not at rest.   She has been using  robaxin lyrica and tramadol , and adding oxycontin (headache medication) for severe pain (prescribed by her neurologist for migrain headaches).  Has been having constipation .    4) weight gain: she is now  268 lbs bmi now 28, which surpasses her pre bariatric surgery weight  With recent gain of   9 lb wt since  sept.  She blames the holidays, and stress eating.  No following the bariatric meal  plan.  Says she has tried in the past and still gained weight. 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  Using fiber bars    5) Type 2 Diabetes, uncontrolled.   last a1c was 9.2 sept 28th .  Blood sugars were "sky high" over the holiday due to lots of  indulgent eating .  Since jan 1 reports that she  returned to low GI diet and regular meal times.  Eating 3-4 meals dilay to prevent low BS.  Having some lows in afternoon and evening .so has reduced mealtime insulin from 20 to 15 units in the afternoon and evening.  Taking 66 units of Novolon N at bedtime,  20 units novolog at breakfast.  Needs refill on syringes,  diabetes meds  Outpatient Prescriptions Prior to Visit  Medication Sig Dispense Refill  . acetaminophen (TYLENOL) 325 MG tablet Take 650 mg by mouth every 6 (six) hours as needed.    Marland Kitchen atorvastatin (LIPITOR) 20 MG tablet Take 1 tablet (20 mg total) by mouth daily. 90 tablet 3  . azelastine (ASTELIN) 0.1 % nasal spray Place 2 sprays into both nostrils 2 (two) times daily. Use in each nostril as directed    .  B-D INS SYR MICROFINE 1CC/28G 28G X 1/2" 1 ML MISC USE 1 SYRINGE DAILY AFTER SUPPER 100 each 0  . buPROPion (WELLBUTRIN XL) 300 MG 24 hr tablet Take 450 mg by mouth daily.     . cyanocobalamin (,VITAMIN B-12,) 1000 MCG/ML injection Inject 1 mL (1,000 mcg total) into the muscle once. Weekly for 3 weeks,  Then monthly thereafter 10 mL 2  . EPINEPHrine (EPIPEN 2-PAK) 0.3 mg/0.3 mL SOAJ injection Inject 0.3 mLs (0.3 mg total) into the muscle once. For severe allergic reaction 1 Device 5  . fluticasone (FLONASE) 50 MCG/ACT nasal spray USE TWO SPRAYS IN EACH NOSTRIL DAILY 16 g 3  . furosemide (LASIX) 20 MG tablet Take 1 tablet (20 mg total) by mouth daily. 30 tablet 3  . glipiZIDE  (GLUCOTROL) 5 MG tablet TAKE 1 TABLET BY MOUTH TWICE DAILY BEFORE MEALS. 180 tablet 0  . hydrochlorothiazide (MICROZIDE) 12.5 MG capsule TAKE ONE CAPSULE BY MOUTH ONE TIME DAILY 30 capsule 1  . insulin aspart (NOVOLOG FLEXPEN) 100 UNIT/ML FlexPen Inject 20 Units into the skin 3 (three) times daily with meals. 30 mL 11  . Insulin Pen Needle (PEN NEEDLES) 31G X 6 MM MISC Use with Novolog Flexpen as directed 100 each 5  . Insulin Syringe-Needle U-100 (B-D INS SYR ULTRAFINE 1CC/31G) 31G X 5/16" 1 ML MISC 1 Syringe by Does not apply route daily. 100 each 0  . lamoTRIgine (LAMICTAL) 100 MG tablet Take 150 mg by mouth daily.     [name] Device MISC Test blood sugar two times daily 100 each 6  . lisinopril (PRINIVIL,ZESTRIL) 5 MG tablet Take 1 tablet (5 mg total) by mouth daily. 90 tablet 3  . LORazepam (ATIVAN) 0.5 MG tablet Take 0.5 mg by mouth daily. ! To 2 tablets per day as needed for anxiety.    . meclizine (ANTIVERT) 25 MG tablet Take 1 tablet (25 mg total) by mouth 3 (three) times daily as needed for dizziness. 90 tablet 6  . metFORMIN (GLUMETZA) 1000 MG (MOD) 24 hr tablet Take 2 tablets (2,000 mg total) by mouth daily with breakfast. 60 tablet 5  . methocarbamol (ROBAXIN) 500 MG tablet TAKE ONE TABLET BY MOUTH FOUR TIMES DAILY  (Patient taking differently: Take one or two tabs four times daily, as needed) 90 tablet 3  . norethindrone (MICRONOR,CAMILA,ERRIN) 0.35 MG tablet Take 1 tablet by mouth daily.    Marland Kitchen NOVOLIN N 100 UNIT/ML injection INJECT 55 UNITS INTO THE SKIN AT BEDTIME AS DIRECTED 20 mL 5  . nystatin (MYCOSTATIN/NYSTOP) 100000 UNIT/GM POWD APPLY TO AFFECTED AREA TWICE A DAY 60 g 0  . OnabotulinumtoxinA (BOTOX IJ) Inject as directed every 3 (three) months. For migraine    . ONETOUCH VERIO test strip TEST BLOOD SUGAR TWICE DAILY 100 each 5  . oxyCODONE-acetaminophen (PERCOCET) 10-325 MG tablet Take 1 tablet by mouth every 6 (six) hours as needed for pain.    . potassium chloride (MICRO-K)  10 MEQ CR capsule TAKE TWO CAPSULES BY MOUTH DAILY 90 capsule 2  . pregabalin (LYRICA) 50 MG capsule Take 300 mg by mouth at bedtime.     . Prenatal Vit-Fe Fumarate-FA (PRENATAL MULTIVITAMIN) TABS Take 1 tablet by mouth daily.    . promethazine (PHENERGAN) 25 MG tablet TAKE ONE TABLET BY MOUTH EVERY EIGHT HOURS AS NEEDED 20 tablet 4  . saxagliptin HCl (ONGLYZA) 5 MG TABS tablet Take 1 tablet (5 mg total) by mouth daily. 30 tablet 5  . SUMAtriptan (IMITREX) 100  MG tablet TAKE 1 TABLET BY MOUTH EVERY DAY AS NEEDED MAY REPEAT IN 2HRS IF HEADACHE PERSISTS 9 tablet 2  . Syringe, Disposable, 3 ML MISC For use with B12 injections weekly/monthly 25 each 1  . traMADol (ULTRAM) 50 MG tablet TAKE 1 TABLET BY MOUTH EVERY 6 HOURS AS NEEDED 90 tablet 1  . VENTOLIN HFA 108 (90 BASE) MCG/ACT inhaler INHALE ONE PUFF BY MOUTH EVERY SIX HOURS AS NEEDED FOR WHEEZING  18 g 3  . Vitamin D, Ergocalciferol, (DRISDOL) 50000 units CAPS capsule TAKE 1 CAPSULE BY MOUTH ONCE A WEEK. 12 capsule 0  . clobetasol cream (TEMOVATE) 0.05 % Apply topically 2 (two) times daily. 30 g 3  . gabapentin (NEURONTIN) 300 MG capsule Take 900 mg by mouth daily. Reported on 12/27/2015    . hydrOXYzine (ATARAX/VISTARIL) 25 MG tablet Take 1 tablet (25 mg total) by mouth every 6 (six) hours as needed for itching. 30 tablet 0   No facility-administered medications prior to visit.    Review of Systems;  Patient denies headache, fevers, malaise, unintentional weight loss, skin rash, eye pain, sinus congestion and sinus pain, sore throat, dysphagia,  hemoptysis , cough, dyspnea, wheezing, chest pain, palpitations, orthopnea, edema, abdominal pain, nausea, melena, diarrhea, constipation, flank pain, dysuria, hematuria, urinary  Frequency, nocturia, numbness, tingling, seizures,  Focal weakness, Loss of consciousness,  Tremor, insomnia, depression, anxiety, and suicidal ideation.      Objective:  BP 130/90 mmHg  Pulse 116  Temp(Src) 98.1 F (36.7  C) (Oral)  Resp 12  Ht 5\' 4"  (1.626 m)  Wt 268 lb 4 oz (121.677 kg)  BMI 46.02 kg/m2  SpO2 98%  LMP 12/24/2015  BP Readings from Last 3 Encounters:  12/27/15 130/90  09/08/15 118/78  08/25/15 112/78    Wt Readings from Last 3 Encounters:  12/27/15 268 lb 4 oz (121.677 kg)  09/08/15 259 lb 2 oz (117.538 kg)  08/25/15 242 lb 6.4 oz (109.952 kg)    General appearance: alert, cooperative and appears stated age Ears: normal TM's and external ear canals both ears Throat: lips, mucosa, and tongue normal; teeth and gums normal Neck: no adenopathy, no carotid bruit, supple, symmetrical, trachea midline and thyroid not enlarged, symmetric, no tenderness/mass/nodules Back: symmetric, no curvature. ROM normal. No CVA tenderness. Lungs: clear to auscultation bilaterally Heart: regular rate and rhythm, S1, S2 normal, no murmur, click, rub or gallop Abdomen: soft, non-tender; bowel sounds normal; no masses,  no organomegaly Pulses: 2+ and symmetric Skin: Skin color, texture, turgor normal. No rashes or lesions Lymph nodes: Cervical, supraclavicular, and axillary nodes normal.  Lab Results  Component Value Date   HGBA1C 9.2* 09/08/2015   HGBA1C 12.0* 05/28/2015   HGBA1C 8.3* 08/28/2014    Lab Results  Component Value Date   CREATININE 0.86 08/25/2015   CREATININE 0.91 06/03/2015   CREATININE 1.00 05/31/2015    Lab Results  Component Value Date   WBC 8.3 08/25/2015   HGB 11.8* 08/25/2015   HCT 37.7 08/25/2015   PLT 527.0* 08/25/2015   GLUCOSE 271* 08/25/2015   CHOL 196 05/28/2015   TRIG 183.0* 05/28/2015   HDL 34.10* 05/28/2015   LDLDIRECT 177.0 09/08/2015   LDLCALC 125* 05/28/2015   ALT 32 08/25/2015   AST 22 08/25/2015   NA 137 08/25/2015   K 4.6 08/25/2015   CL 100 08/25/2015   CREATININE 0.86 08/25/2015   BUN 18 08/25/2015   CO2 29 08/25/2015   TSH 1.91 05/27/2014   HGBA1C 9.2* 09/08/2015  MICROALBUR <0.7 09/08/2015    No results found.  Assessment &  Plan:   Problem List Items Addressed This Visit    Diabetes mellitus type 2, uncontrolled ([place])    Secondary to dietary noncompliance and lack of exercise.  By history her control  is improving using evening NPH  insulin doseofo 66 units and pre meal insulin of 15 to 20 units . Awaiting repeat A1c.   Lab Results  Component Value Date   HGBA1C 9.2* 09/08/2015   Lab Results  Component Value Date   MICROALBUR <0.7 09/08/2015   Lab Results  Component Value Date   CREATININE 0.86 08/25/2015           Relevant Orders   Hemoglobin A1c   LDL cholesterol, direct   Lipid panel   Comprehensive metabolic panel   Microalbumin / creatinine urine ratio   Noncompliance with diabetes treatment    Reports compliance with use of insulin 4 times daily and resuming low GI diet      RESOLVED: Dysuria - Primary   Relevant Orders   POCT Urinalysis Dipstick (Completed)   Urinalysis, Routine w reflex microscopic   Urine Culture   Constipation    Opioid induced.  Advised to use dulcolax or sennakot s whenever she takes oxycodone.  Lactulose given for cathartic given lack of movement in one week.       Elevated serum glucose with glucosuria    Secondary to uncontrolled DM. Marland Kitchen No signs of UTI today      Hip pain, chronic    Left, with no prior workup,  Suspect DJD given morbid obesity.  Given her prior bariatric surgery and risk of low D, checking D, and   Should consider getting a DEXA       Relevant Orders   DG HIP UNILAT WITH PELVIS 2-3 VIEWS LEFT   Pain in joint, ankle and foot    5th MT head is the origin.  [place] films and podiatry referral ordered       Relevant Orders   DG Foot Complete Right   Vitamin D deficiency   Relevant Orders   VITAMIN D 25 Hydroxy (Vit-D Deficiency, Fractures)    Other Visit Diagnoses    Morbid obesity due to excess calories [facility])        Relevant Orders    Ambulatory referral to General Surgery    History of gastric bypass        Relevant Orders      Ambulatory referral to General Surgery      A total of 40 minutes was spent with patient more than half of which was spent in counseling patient on the above mentioned issues , reviewing and explaining recent labs and imaging studies done, and coordination of care.  I have discontinued Ms. Dornbush's hydrOXYzine, gabapentin, and clobetasol cream. I am also having her start on Lactulose. Additionally, I am having her maintain her prenatal multivitamin, acetaminophen, Lancet Device, potassium chloride, methocarbamol, EPINEPHrine, Insulin Syringe-Needle U-100, pregabalin, OnabotulinumtoxinA (BOTOX IJ), VENTOLIN HFA, azelastine, norethindrone, lamoTRIgine, LORazepam, lisinopril, insulin aspart, Pen Needles, meclizine, ONETOUCH VERIO, buPROPion, oxyCODONE-acetaminophen, furosemide, atorvastatin, cyanocobalamin, Syringe (Disposable), glipiZIDE, B-D INS SYR MICROFINE 1CC/28G, SUMAtriptan, NOVOLIN N, traMADol, fluticasone, metFORMIN, saxagliptin HCl, hydrochlorothiazide, promethazine, Vitamin D (Ergocalciferol), and nystatin.  Meds ordered this encounter  Medications  . Lactulose 20 GM/30ML SOLN    Sig: 30 ml every 4 hours until constipation is relieved    Dispense:  236 mL    Refill:  3  Medications Discontinued During This Encounter  Medication Reason  . gabapentin (NEURONTIN) 300 MG capsule Error  . hydrOXYzine (ATARAX/VISTARIL) 25 MG tablet Error  . clobetasol cream (TEMOVATE) 0.05 % Completed Course    Follow-up: Return in about 3 months (around 03/26/2016) for follow up diabetes.   [name], MD

## 2015-12-27 NOTE — Patient Instructions (Signed)
I have ordered x rays of your left hip and right foot  You do not need an appointment,  Just go to the medical mall at [facility] during business hours  Podiatry and Bariatric surgery referrals are in process  Use the lactulose to relieve your constipation.  Then remember to use Dulcolax or Sennakot on the days you take oxycontin because this medication is very constipating   Your urine does not suggest infection, But we will wait for the culture resutls

## 2015-12-27 NOTE — Progress Notes (Signed)
Pre-visit discussion using our clinic review tool. No additional management support is needed unless otherwise documented below in the visit note.  

## 2015-12-28 ENCOUNTER — Ambulatory Visit
Admission: RE | Admit: 2015-12-28 | Discharge: 2015-12-28 | Disposition: A | Discharge status: Home / Self Care | Payer: BC Managed Care – PPO | Source: Ambulatory Visit | Attending: Internal Medicine | Admitting: Internal Medicine

## 2015-12-28 DIAGNOSIS — M25559 Pain in unspecified hip: Secondary | ICD-10-CM

## 2015-12-28 DIAGNOSIS — M25571 Pain in right ankle and joints of right foot: Secondary | ICD-10-CM | POA: Insufficient documentation

## 2015-12-28 DIAGNOSIS — M7731 Calcaneal spur, right foot: Secondary | ICD-10-CM | POA: Diagnosis not present

## 2015-12-28 DIAGNOSIS — G8929 Other chronic pain: Secondary | ICD-10-CM

## 2015-12-28 DIAGNOSIS — R81 Glycosuria: Secondary | ICD-10-CM

## 2015-12-28 DIAGNOSIS — M25579 Pain in unspecified ankle and joints of unspecified foot: Secondary | ICD-10-CM | POA: Insufficient documentation

## 2015-12-28 DIAGNOSIS — M25552 Pain in left hip: Principal | ICD-10-CM

## 2015-12-28 DIAGNOSIS — R7309 Other abnormal glucose: Secondary | ICD-10-CM | POA: Insufficient documentation

## 2015-12-28 LAB — URINALYSIS, ROUTINE W REFLEX MICROSCOPIC
BILIRUBIN URINE: NEGATIVE
KETONES UR: NEGATIVE
Leukocytes, UA: NEGATIVE
Nitrite: NEGATIVE
PH: 5.5 (ref 5.0–8.0)
Total Protein, Urine: NEGATIVE
UROBILINOGEN UA: 0.2 (ref 0.0–1.0)
Urine Glucose: >=1000 — AB

## 2015-12-28 LAB — COMPREHENSIVE METABOLIC PANEL
ALBUMIN: 3.7 g/dL (ref 3.5–5.2)
ALK PHOS: 103 U/L (ref 39–117)
ALT: 15 U/L (ref 0–35)
AST: 14 U/L (ref 0–37)
BILIRUBIN TOTAL: 0.2 mg/dL (ref 0.2–1.2)
BUN: 17 mg/dL (ref 6–23)
CALCIUM: 9.1 mg/dL (ref 8.4–10.5)
CO2: 30 meq/L (ref 19–32)
CREATININE: 1.08 mg/dL (ref 0.40–1.20)
Chloride: 100 mEq/L (ref 96–112)
GFR: 58.19 mL/min — AB (ref >60.00)
Glucose, Bld: 322 mg/dL — ABNORMAL HIGH (ref 70–99)
Potassium: 4.8 mEq/L (ref 3.5–5.1)
Sodium: 137 mEq/L (ref 135–145)
TOTAL PROTEIN: 7.2 g/dL (ref 6.0–8.3)

## 2015-12-28 LAB — LDL CHOLESTEROL, DIRECT: Direct LDL: 102 mg/dL

## 2015-12-28 LAB — LIPID PANEL
CHOL/HDL RATIO: 4
CHOLESTEROL: 155 mg/dL (ref 0–200)
HDL: 44 mg/dL (ref >39.00)
LDL Cholesterol: 86 mg/dL (ref 0–99)
NonHDL: 111.07
TRIGLYCERIDES: 126 mg/dL (ref 0.0–149.0)
VLDL: 25.2 mg/dL (ref 0.0–40.0)

## 2015-12-28 LAB — MICROALBUMIN / CREATININE URINE RATIO
CREATININE, U: 14.6 mg/dL
MICROALB/CREAT RATIO: 4.8 mg/g (ref 0.0–30.0)

## 2015-12-28 LAB — VITAMIN D 25 HYDROXY (VIT D DEFICIENCY, FRACTURES): VITD: 19.5 ng/mL — ABNORMAL LOW (ref 30.00–100.00)

## 2015-12-28 LAB — HEMOGLOBIN A1C: HEMOGLOBIN A1C: 8.1 % — AB (ref 4.6–6.5)

## 2015-12-28 NOTE — Assessment & Plan Note (Signed)
5th MT head is the origin.  [place] [facility] and podiatry referral ordered

## 2015-12-28 NOTE — Assessment & Plan Note (Addendum)
Despite prior lap Roux -en--Y in 2003  ,  Due to eating disorder.  Referring to bariatric surgery . Prior surgery was done at [facility] by [facility] but she prefers to be seen locally. Referral in process

## 2015-12-28 NOTE — Assessment & Plan Note (Signed)
Reports compliance with use of insulin 4 times daily and resuming low GI diet

## 2015-12-28 NOTE — Assessment & Plan Note (Signed)
Opioid induced.  Advised to use dulcolax or sennakot s whenever she takes oxycodone.  Lactulose given for cathartic given lack of movement in one week.

## 2015-12-28 NOTE — Assessment & Plan Note (Signed)
Secondary to dietary noncompliance and lack of exercise.  By history her control  is improving using evening NPH  insulin doseofo 66 units and pre meal insulin of 15 to 20 units . Awaiting repeat A1c.   Lab Results  Component Value Date   HGBA1C 9.2* 09/08/2015   Lab Results  Component Value Date   MICROALBUR <0.7 09/08/2015   Lab Results  Component Value Date   CREATININE 0.86 08/25/2015

## 2015-12-28 NOTE — Assessment & Plan Note (Addendum)
Left, with no prior workup,  Suspect DJD given morbid obesity.  Given her prior bariatric surgery and risk of low D, checking D, and   Should consider getting a DEXA

## 2015-12-28 NOTE — Assessment & Plan Note (Signed)
Secondary to uncontrolled DM. Marland Kitchen No signs of UTI today

## 2015-12-29 ENCOUNTER — Encounter: Payer: Self-pay | Admitting: Internal Medicine

## 2015-12-29 DIAGNOSIS — G8929 Other chronic pain: Secondary | ICD-10-CM

## 2015-12-29 DIAGNOSIS — M25551 Pain in right hip: Principal | ICD-10-CM

## 2015-12-29 LAB — URINE CULTURE: Colony Count: 30000

## 2016-01-04 ENCOUNTER — Telehealth: Payer: Self-pay | Admitting: Internal Medicine

## 2016-01-04 DIAGNOSIS — M79671 Pain in right foot: Principal | ICD-10-CM

## 2016-01-04 DIAGNOSIS — G8929 Other chronic pain: Secondary | ICD-10-CM

## 2016-01-04 NOTE — Telephone Encounter (Signed)
Hip and right foot pain, heel spur, saw you last week and thought that a referral was going to be put for podiatry also.  She wasn't sure if orthopedics will take care of it all? Please advise?

## 2016-01-04 NOTE — Telephone Encounter (Signed)
Pt asking about her referral to podiatry. I do not see a referral for this. Please advise.

## 2016-01-04 NOTE — Telephone Encounter (Signed)
Podiatry and ortho referrals have been ordered

## 2016-01-06 ENCOUNTER — Other Ambulatory Visit: Payer: Self-pay | Admitting: Internal Medicine

## 2016-01-08 ENCOUNTER — Other Ambulatory Visit: Payer: Self-pay | Admitting: Internal Medicine

## 2016-01-09 ENCOUNTER — Other Ambulatory Visit: Payer: Self-pay | Admitting: Internal Medicine

## 2016-01-10 NOTE — Telephone Encounter (Signed)
Last OV 12/27/15 ok to refill Tramadol?

## 2016-01-11 NOTE — Telephone Encounter (Signed)
Ok to refill,  Authorized in epic 

## 2016-01-13 ENCOUNTER — Encounter: Payer: Self-pay | Admitting: Podiatry

## 2016-01-13 ENCOUNTER — Other Ambulatory Visit: Payer: Self-pay | Admitting: Podiatry

## 2016-01-13 ENCOUNTER — Encounter: Payer: Self-pay | Admitting: Family Medicine

## 2016-01-13 ENCOUNTER — Ambulatory Visit (INDEPENDENT_AMBULATORY_CARE_PROVIDER_SITE_OTHER): Payer: BC Managed Care – PPO | Admitting: Family Medicine

## 2016-01-13 ENCOUNTER — Ambulatory Visit (INDEPENDENT_AMBULATORY_CARE_PROVIDER_SITE_OTHER): Payer: BC Managed Care – PPO

## 2016-01-13 ENCOUNTER — Ambulatory Visit (INDEPENDENT_AMBULATORY_CARE_PROVIDER_SITE_OTHER): Payer: BC Managed Care – PPO | Admitting: Podiatry

## 2016-01-13 VITALS — BP 110/80 | HR 108 | Resp 16 | Ht 64.0 in | Wt 259.0 lb

## 2016-01-13 VITALS — BP 122/74 | HR 99 | Temp 98.0°F | Ht 64.0 in | Wt 268.2 lb

## 2016-01-13 DIAGNOSIS — R52 Pain, unspecified: Secondary | ICD-10-CM

## 2016-01-13 DIAGNOSIS — H6091 Unspecified otitis externa, right ear: Secondary | ICD-10-CM | POA: Diagnosis not present

## 2016-01-13 DIAGNOSIS — M7671 Peroneal tendinitis, right leg: Secondary | ICD-10-CM | POA: Diagnosis not present

## 2016-01-13 DIAGNOSIS — H6090 Unspecified otitis externa, unspecified ear: Secondary | ICD-10-CM | POA: Insufficient documentation

## 2016-01-13 DIAGNOSIS — M7670 Peroneal tendinitis, unspecified leg: Secondary | ICD-10-CM | POA: Insufficient documentation

## 2016-01-13 MED ORDER — CIPROFLOXACIN-DEXAMETHASONE 0.3-0.1 % OT SUSP: 4.0000 [drp] | Freq: Two times a day (BID) | OTIC | Status: DC

## 2016-01-13 MED ORDER — DICLOFENAC SODIUM 1 % TD GEL: 2.0000 g | Freq: Four times a day (QID) | TRANSDERMAL | Status: DC

## 2016-01-13 NOTE — Progress Notes (Signed)
   Subjective:    Patient ID: Maria Bentley, female    DOB: May 14, 1971, 45 y.o.   MRN: [id number]  HPI Patient presents with foot pain in their right foot-lateral side. Pt stated, "feels like a marble is in foot"; x8 months. She thinks it may have start from a shoe she got with a "stability bar" which was rubbing. Shoe changes has not helped. Pain is intermiitent. Walking and standing hurt. Pain is resolved with rest. She has different than the plantar fasciitis she had previously. Occasional tingling or numbness to both feet. She is going to see a doctor today for low back and hip pain. No swelling or redness. She has had several falls over the last year (slipped on water). Family history of vertigo, and has discussed this with her PCP.   Patient is diabetic type 2; sugar=did not take today; A1C=8.2.   Review of Systems  Constitutional: Positive for fatigue and unexpected weight change.  HENT: Positive for ear pain, sinus pressure and tinnitus.   Gastrointestinal: Positive for nausea and constipation.  Endocrine: Positive for heat intolerance.  Musculoskeletal: Positive for myalgias, back pain, arthralgias and gait problem.  Skin: Positive for color change.  Allergic/Immunologic: Positive for food allergies.  All other systems reviewed and are negative.      Objective:   Physical Exam General: AAO x3, NAD  Dermatological: Skin is warm, dry and supple bilateral. Nails x 10 are well manicured; remaining integument appears unremarkable at this time. There are no open sores, no preulcerative lesions, no rash or signs of infection present.  Vascular: Dorsalis Pedis artery and Posterior Tibial artery pedal pulses are 2/4 bilateral with immedate capillary fill time. Pedal hair growth present. No varicosities and no lower extremity edema present bilateral. There is no pain with calf compression, swelling, warmth, erythema.   Neruologic: Grossly intact via light touch bilateral. Vibratory  intact via tuning fork bilateral. Protective threshold with Semmes Wienstein monofilament intact to all pedal sites bilateral. Vibratory sensation altered bilaterally.  Musculoskeletal: There is tenderness to palpation over the distal portion of the peroneal tendons just proximal to that the fifth metatarsal base along the insertion. There is no pain the vibratory sensation over the fifth metatarsal this time. No pain vibratory sensation. There is also some mild discomfort along the flexor tendons on the medial ankle how there is no pain on the course of posterior tibial tendon or along the insertion into the navicular tuberosity. There is no significant discomfort along the course of the plantar fascia. No other areas of tenderness bilateral lower extremity's.  MMT 5/5, ROM WNL.   Gait: Unassisted, Nonantalgic.       Assessment & Plan:  45 year old female with peroneal tendinitis right foot  -Treatment options discussed including all alternatives, risks, and complications -X-rays were obtained and reviewed with the patient. Calcaneal spurring is present. There is a small spur the base of the fifth metatarsal. There is no evidence of acute fracture or stress fracture.  -Etiology of symptoms were discussed -Discussed steroid injection however we'll we will hold off on that. If symptoms continue next appointment will consider.  -Prescribed Voltaren gel  -Stretching/rehabilitation exercises for peroneal tendinitis  -Ankle brace dispensed today.  -Follow-up 3 weeks or sooner if any problems arise. In the meantime, encouraged to call the office with any questions, concerns, change in symptoms.   Celesta Gentile, DPM

## 2016-01-13 NOTE — Patient Instructions (Signed)
Peroneal Tendinitis With Rehab Tendonitis is inflammation of a tendon. Inflammation of the tendons on the back of the outer ankle (peroneal tendons) is known as peroneal tendonitis. The peroneal tendons are responsible for connecting the muscles that allow you to stand on your tiptoes to the bones of the ankle. For this reason, peroneal tendonitis often causes pain when trying to complete such motions. Peroneal tendonitis often involves a tear (strain) of the peroneal tendons. Strains are classified into three categories. Grade 1 strains cause pain, but the tendon is not lengthened. Grade 2 strains include a lengthened ligament, due to the ligament being stretched or partially ruptured. With grade 2 strains there is still function, although function may be decreased. Grade 3 strains involve a complete tear of the tendon or muscle, and function is usually impaired. SYMPTOMS   Pain, tenderness, swelling, warmth, or redness over the back of the outer side of the ankle, the outer part of the mid-foot, or the bottom of the arch.  Pain that gets worse with ankle motion (especially when pushing off or pushing down with the front of the foot), or when standing on the ball of the foot or pushing the foot outward.  Crackling sound (crepitation) when the tendon is moved or touched. CAUSES  Peroneal tendinitis occurs when injury to the peroneal tendons causes the body to respond with inflammation. Common causes of injury include:  An overuse injury, in which the groove behind the outer ankle (where the tendon is located) causes wear on the tendon.  A sudden stress placed on the tendon, such as from an increase in the intensity, frequency, or duration of training.  Direct hit (trauma) to the tendon.  Return to activity too soon after a previous ankle injury. RISK INCREASES WITH:  Sports that require sudden, repetitive pushing off of the foot, such as jumping or quick starts.  Kicking and running sports,  especially running down hills or long distances.  Poor strength and flexibility.  Previous injury to the foot, ankle, or leg. PREVENTION  Warm up and stretch properly before activity.  Allow for adequate recovery between workouts.  Maintain physical fitness:  Strength, flexibility, and endurance.  Cardiovascular fitness.  Complete rehabilitation after previous injury. PROGNOSIS  If treated properly, peroneal tendonitis usually heals within 6 weeks.  RELATED COMPLICATIONS  Longer healing time, if not properly treated or if not given enough time to heal.  Recurring symptoms if activity is resumed too soon, with overuse, or when using poor technique.  If untreated, tendinitis may result in tendon rupture, requiring surgery. TREATMENT  Treatment first involves the use of ice and medicine to reduce pain and inflammation. The use of strengthening and stretching exercises may help reduce pain with activity. These exercises may be performed at unsuccessful, surgery to remove the inflamed tendon lining (sheath) may be advised.  MEDICATION   If pain medicine is needed, nonsteroidal anti-inflammatory medicines (aspirin and ibuprofen), or other minor pain relievers (acetaminophen), are often advised.  Do not take pain medicine for 7 days before surgery.  Prescription pain relievers may be given, if your caregiver thinks they are needed. Use only as directed and only as much as you need. HEAT AND COLD  Cold treatment (icing) should be applied for 10 to 15 minutes every 2 to 3 hours for inflammation and pain, and immediately after activity that aggravates your symptoms. Use ice packs or an ice massage.  Heat treatment may be used before performing stretching and strengthening activities prescribed by your   caregiver, physical therapist, or athletic trainer. Use a heat pack or a warm water soak. SEEK MEDICAL CARE IF:  Symptoms get worse or do not improve in 2 to 4 weeks, despite  treatment.  New, unexplained symptoms develop. (Drugs used in treatment may produce side effects.) EXERCISES RANGE OF MOTION (ROM) AND STRETCHING EXERCISES - Peroneal Tendinitis These exercises may help you when beginning to rehabilitate your injury. Your symptoms may resolve with or without further involvement from your physician, physical therapist or athletic trainer. While completing these exercises, remember:   Restoring tissue flexibility helps normal motion to return to the joints. This allows healthier, less painful movement and activity.  An effective stretch should be held for at least 30 seconds.  A stretch should never be painful. You should only feel a gentle lengthening or release in the stretched tissue. RANGE OF MOTION - Ankle Eversion  Sit with your right / left ankle crossed over your opposite knee.  Grip your foot with your opposite hand, placing your thumb on the top of your foot and your fingers across the bottom of your foot.  Gently push your foot downward with a slight rotation, so your littlest toes rise slightly toward the ceiling.  You should feel a gentle stretch on the inside of your ankle. Hold the stretch for __________ seconds. Repeat __________ times. Complete this exercise __________ times per day.  RANGE OF MOTION - Ankle Inversion  Sit with your right / left ankle crossed over your opposite knee.  Grip your foot with your opposite hand, placing your thumb on the bottom of your foot and your fingers across the top of your foot.  Gently pull your foot so the smallest toe comes toward you and your thumb pushes the inside of the ball of your foot away from you.  You should feel a gentle stretch on the outside of your ankle. Hold the stretch for __________ seconds. Repeat __________ times. Complete this exercise __________ times per day.  RANGE OF MOTION - Ankle Plantar Flexion  Sit with your right / left leg crossed over your opposite knee.  Use  your opposite hand to pull the top of your foot and toes toward you.  You should feel a gentle stretch on the top of your foot and ankle. Hold this position for __________ seconds. Repeat __________ times. Complete __________ times per day.  STRETCH - Gastroc, Standing  Place your hands on a wall.  Extend your right / left leg behind you, keeping the front knee somewhat bent.  Slightly point your toes inward on your back foot.  Keeping your right / left heel on the floor and your knee straight, shift your weight toward the wall, not allowing your back to arch.  You should feel a gentle stretch in the calf. Hold this position for __________ seconds. Repeat __________ times. Complete this stretch __________ times per day. STRETCH - Soleus, Standing  Place your hands on a wall.  Extend your right / left leg behind you, keeping the other knee somewhat bent.  Slightly point your toes inward on your back foot.  Keep your heel on the floor, bend your back knee, and slightly shift your weight over the back leg so that you feel a gentle stretch deep in your back calf.  Hold this position for __________ seconds. Repeat __________ times. Complete this stretch __________ times per day. STRETCH - Gastrocsoleus, Standing Note: This exercise can place a lot of stress on your foot and ankle. Please   complete this exercise only if specifically instructed by your caregiver.   Place the ball of your right / left foot on a step, keeping your other foot firmly on the same step.  Hold on to the wall or a rail for balance.  Slowly lift your other foot, allowing your body weight to press your heel down over the edge of the step.  You should feel a stretch in your right / left calf.  Hold this position for __________ seconds.  Repeat this exercise with a slight bend in your knee. Repeat __________ times. Complete this stretch __________ times per day.  STRENGTHENING EXERCISES - Peroneal Tendinitis   These exercises may help you when beginning to rehabilitate your injury. They may resolve your symptoms with or without further involvement from your physician, physical therapist or athletic trainer. While completing these exercises, remember:   Muscles can gain both the endurance and the strength needed for everyday activities through controlled exercises.  Complete these exercises as instructed by your physician, physical therapist or athletic trainer. Increase the resistance and repetitions only as guided by your caregiver. STRENGTH - Dorsiflexors  Secure a rubber exercise band or tubing to a fixed object (table, pole) and loop the other end around your right / left foot.  Sit on the floor facing the fixed object. The band should be slightly tense when your foot is relaxed.  Slowly draw your foot back toward you, using your ankle and toes.  Hold this position for __________ seconds. Slowly release the tension in the band and return your foot to the starting position. Repeat __________ times. Complete this exercise __________ times per day.  STRENGTH - Towel Curls  Sit in a chair, on a non-carpeted surface.  Place your foot on a towel, keeping your heel on the floor.  Pull the towel toward your heel only by curling your toes. Keep your heel on the floor.  If instructed by your physician, physical therapist or athletic trainer, add weight to the end of the towel. Repeat __________ times. Complete this exercise __________ times per day. STRENGTH - Ankle Eversion   Secure one end of a rubber exercise band or tubing to a fixed object (table, pole). Loop the other end around your foot, just before your toes.  Place your fists between your knees. This will focus your strengthening at your ankle.  Drawing the band across your opposite foot, away from the pole, slowly, pull your little toe out and up. Make sure the band is positioned to resist the entire motion.  Hold this position for  __________ seconds.  Have your muscles resist the band, as it slowly pulls your foot back to the starting position. Repeat __________ times. Complete this exercise __________ times per day.    This information is not intended to replace advice given to you by your health care provider. Make sure you discuss any questions you have with your health care provider.   Document Released: 11/27/2005 Document Revised: 04/13/2015 Document Reviewed: 03/11/2009 Elsevier Interactive Patient Education 2016 [employer]  

## 2016-01-13 NOTE — Assessment & Plan Note (Signed)
History and exam consistent with otitis externa. Mild in nature. Will treat with Ciprodex as she has responded well to this in the past. She'll continue to monitor. She's given return precautions.

## 2016-01-13 NOTE — Patient Instructions (Signed)
Nice to see you. We will treat you for otitis externa with ciprodex.  Please monitor for fevers, increasing pain, tenderness behind your ear, or new or changing symptoms and seek medical attention if these occur.

## 2016-01-13 NOTE — Progress Notes (Signed)
Patient ID: Maria Bentley, female   DOB: 12-20-1970, 45 y.o.   MRN: [id number]  Tommi Rumps, MD Phone: [phone]  Maria Bentley is a 45 y.o. female who presents today for same day visit.  Notes right ear pain and itching for the past several days. No discharge or fever. Mild muffled hearing. Notes mild discomfort if she pulls on her ear lobe. Notes no new sinus issues. Does note chronic allergies for which she gets allergy shots. Notes she takes Benadryl if her allergies are acting up. She has a history of otitis externa treated with Ciprodex in the past. She states this feels similar to her otitis externa in the past.  [id number] smoker.   ROS see history of present illness   Objective  Physical Exam Filed Vitals:   01/13/16 1310  BP: 122/74  Pulse: 99  Temp: 98 F (36.7 C)    Physical Exam  Constitutional: She is well-developed, well-nourished, and in no distress.  HENT:  Head: Normocephalic and atraumatic.  Mouth/Throat: Oropharynx is clear and moist. No oropharyngeal exudate.  Left external ear and ear canal normal, normal left TM, right external ear normal, right canal with mild skin flaking, erythema, and minimal swelling, no vesicular lesions or pustules, normal right TM, no mastoid process tenderness bilaterally  Eyes: Conjunctivae are normal. Pupils are equal, round, and reactive to light.  Neck: Neck supple.  Cardiovascular: Normal rate, regular rhythm and normal heart sounds.  Exam reveals no gallop and no friction rub.   No murmur heard. Pulmonary/Chest: Effort normal and breath sounds normal. No respiratory distress. She has no wheezes. She has no rales.  Lymphadenopathy:    She has no cervical adenopathy.  Neurological: She is alert. Gait normal.  Skin: Skin is warm and dry. She is not diaphoretic.     Assessment/Plan: Please see individual problem list.  Otitis externa History and exam consistent with otitis externa. Mild in nature.  Will treat with Ciprodex as she has responded well to this in the past. She'll continue to monitor. She's given return precautions.    Meds ordered this encounter  Medications  . ciprofloxacin-dexamethasone (CIPRODEX) otic suspension    Sig: Place 4 drops into the right ear 2 (two) times daily.    Dispense:  7.5 mL    Refill:  0     Tommi Rumps

## 2016-01-13 NOTE — Progress Notes (Signed)
Pre visit review using our clinic review tool, if applicable. No additional management support is needed unless otherwise documented below in the visit note. 

## 2016-01-15 ENCOUNTER — Other Ambulatory Visit: Payer: Self-pay | Admitting: Internal Medicine

## 2016-01-18 ENCOUNTER — Encounter: Payer: Self-pay | Admitting: Internal Medicine

## 2016-01-20 ENCOUNTER — Other Ambulatory Visit: Payer: Self-pay | Admitting: Internal Medicine

## 2016-01-24 ENCOUNTER — Telehealth: Payer: Self-pay | Admitting: Internal Medicine

## 2016-01-24 NOTE — Telephone Encounter (Signed)
Mendel Ryder [phone] (ask for Lindsey)called from [facility] regarding getting visit note from 01/04/2016 about lumber spine. Fax number is [phone]. Thank you!

## 2016-01-25 NOTE — Telephone Encounter (Signed)
Called to clarify and they have already received the note that they were requesting.

## 2016-02-01 ENCOUNTER — Other Ambulatory Visit: Payer: Self-pay | Admitting: Internal Medicine

## 2016-02-02 ENCOUNTER — Encounter: Payer: Self-pay | Admitting: Internal Medicine

## 2016-02-03 ENCOUNTER — Ambulatory Visit (INDEPENDENT_AMBULATORY_CARE_PROVIDER_SITE_OTHER): Payer: BC Managed Care – PPO | Admitting: Podiatry

## 2016-02-03 ENCOUNTER — Encounter: Payer: Self-pay | Admitting: Podiatry

## 2016-02-03 VITALS — BP 127/86 | HR 113 | Resp 18

## 2016-02-03 DIAGNOSIS — E119 Type 2 diabetes mellitus without complications: Secondary | ICD-10-CM

## 2016-02-03 DIAGNOSIS — M7671 Peroneal tendinitis, right leg: Secondary | ICD-10-CM

## 2016-02-03 DIAGNOSIS — M2140 Flat foot [pes planus] (acquired), unspecified foot: Secondary | ICD-10-CM | POA: Diagnosis not present

## 2016-02-03 DIAGNOSIS — S93402S Sprain of unspecified ligament of left ankle, sequela: Secondary | ICD-10-CM | POA: Diagnosis not present

## 2016-02-03 NOTE — Patient Instructions (Signed)

## 2016-02-03 NOTE — Progress Notes (Signed)
Patient ID: Maria Bentley, female   DOB: 23-Feb-1971, 45 y.o.   MRN: [id number]  Subjective: 45 year old female presents the office in follow-up evaluation of right ankle pain. She states it is better although she has K get some pain. She's been continuing with the ankle brace which helps. Since last appointment she did sprain her left ankle and she was seen at the medical clinic. She does not want have other x-rays performed today she had x-rays. She does continue to wear Ace bandage but it does swell sometimes. Denies any systemic complaints such as fevers, chills, nausea, vomiting. No acute changes since last appointment, and no other complaints at this time.   She also states that she has a history of multiple ankle sprains previously.  Objective: AAO x3, NAD DP/PT pulses palpable bilaterally, CRT less than 3 seconds Protective sensation intact with Simms Weinstein monofilament On the right ankle there is continued tenderness on the course of the peroneal tendons and on the insertion into the fifth metatarsal base. The peroneal tendons appear to be intact. There is no overlying edema, erythema, increase in warmth. There is tenderness palpation along the ATFL on the left ankle but there is no pain on the CFL or PTF L or along the ankle joint or deltoid ligaments. There is no area pinpoint bony tenderness or pain the vibratory sensation. Ankle, subtalar joint range of motion is intact. There is mild edema along the course the ATFL any erythema or increase in warmth. There is a decrease in medial arch height upon weightbearing. No areas of pinpoint bony tenderness or pain with vibratory sensation. MMT 5/5, ROM WNL. No edema, erythema, increase in warmth to bilateral lower extremities.  No open lesions or pre-ulcerative lesions.  No pain with calf compression, swelling, warmth, erythema  Assessment: Right peroneal tendinitis with some improvement; left ankle sprain  Plan: -All treatment  options discussed with the patient including all alternatives, risks, complications.  -I discussed orthotics given her multiple ankle sprains as well as tendinitis. Should proceed with him today. She was scanned for orthotics were sent to [facility] labs. -On the right-sided discussed steroid injection should proceed with this. Under sterile conditions a total 1 mL mixture Dexon is an phosphate and 0.5% Marcaine plain was infiltrated around the peroneal tendon on the insertion however care was taken not to inject directly into the tendon. Recommend continue with ankle brace all times as steroid injections can cause tendon tears and she understands this. -On the left side continued Ace bandage. Also discussed range of motion, rehabilitation exercises. Given her recent weight loss surgery she did not have anti-inflammatories. Ankle brace if needed. -Follow-up in 3 weeks or sooner if any issues are to arise. -Patient encouraged to call the office with any questions, concerns, change in symptoms.   Celesta Gentile, DPM

## 2016-02-11 ENCOUNTER — Other Ambulatory Visit: Payer: Self-pay | Admitting: Internal Medicine

## 2016-02-14 ENCOUNTER — Other Ambulatory Visit: Payer: Self-pay

## 2016-02-14 ENCOUNTER — Encounter: Payer: Self-pay | Admitting: Internal Medicine

## 2016-02-14 MED ORDER — HYDROCHLOROTHIAZIDE 12.5 MG PO CAPS: 12.5000 mg | ORAL_CAPSULE | Freq: Every day | ORAL | Status: DC

## 2016-02-14 NOTE — Telephone Encounter (Signed)
A Letter of Medical Necessity for Bariatric surgery requested by patient please advise?

## 2016-03-02 ENCOUNTER — Encounter: Payer: Self-pay | Admitting: Podiatry

## 2016-03-02 ENCOUNTER — Ambulatory Visit (INDEPENDENT_AMBULATORY_CARE_PROVIDER_SITE_OTHER): Payer: BC Managed Care – PPO | Admitting: Podiatry

## 2016-03-02 VITALS — BP 120/78 | Resp 18

## 2016-03-02 DIAGNOSIS — M2140 Flat foot [pes planus] (acquired), unspecified foot: Secondary | ICD-10-CM | POA: Diagnosis not present

## 2016-03-02 DIAGNOSIS — S93402S Sprain of unspecified ligament of left ankle, sequela: Secondary | ICD-10-CM | POA: Diagnosis not present

## 2016-03-02 DIAGNOSIS — M7671 Peroneal tendinitis, right leg: Secondary | ICD-10-CM | POA: Diagnosis not present

## 2016-03-02 NOTE — Progress Notes (Signed)
Patient ID: Maria Bentley, female   DOB: [birth year], 45 y.o.   MRN: [id number]  Subjective: 45 year old female presents the office in follow-up evaluation of right ankle pain. She feels that the cortisone injection did help her pain is resolving to the right ankle although she does still gets some intermittent pain. She states the ankle sprain left side is doing better she's continue with the stretching exercises. She states that about 2 days ago she did have a fall and she said that she injured her ribs are her chest muscles. She has not seen a physician for this. Denies any systemic complaints such as fevers, chills, nausea, vomiting. No acute changes since last appointment, and no other complaints at this time.   Objective: AAO x3, NAD DP/PT pulses palpable bilaterally, CRT less than 3 seconds Protective sensation intact with Simms Weinstein monofilament On the right ankle there is decreased but continued tenderness on the course of the peroneal tendons just posterior to the lateral malleolus and on the insertion into the fifth metatarsal base. The peroneal tendons appear to be intact. There is no overlying edema, erythema, increase in warmth. There is no pain to the ankle ligaments on the right side.  On the left side there is decreased tenderness along course the ankle ligaments. Anterior drawer test appears to be stable.  There is no area pinpoint bony tenderness or pain the vibratory sensation to bilateral lower extremities.  No edema, erythema, increase in warmth to bilateral lower extremities.  No open lesions or pre-ulcerative lesions.  No pain with calf compression, swelling, warmth, erythema  Assessment: Right peroneal tendinitis with some improvement; left ankle sprain  Plan: -All treatment options discussed with the patient including all alternatives, risks, complications.  -At this time recommend continue with stretching, icing and rehabilitation exercises. 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 She had an injection last appointment and she's had some improvement in symptoms. She had orthotics dispensed today. Oral and written break in instructions were discussed with the patient. She does have voltaren gel to continue with. -Follow-up in 4 weeks or sooner if any issues are to arise. -Patient encouraged to call the office with any questions, concerns, change in symptoms.  *Strong encouraged her to talk to her PCP about her fall and rib pain  Celesta Gentile, DPM

## 2016-03-06 ENCOUNTER — Other Ambulatory Visit: Payer: Self-pay | Admitting: Internal Medicine

## 2016-03-06 DIAGNOSIS — E1100 Type 2 diabetes mellitus with hyperosmolarity without nonketotic hyperglycemic-hyperosmolar coma (NKHHC): Secondary | ICD-10-CM

## 2016-03-06 NOTE — Telephone Encounter (Addendum)
Ok to refill tramadol last ov 1/17?

## 2016-03-07 NOTE — Telephone Encounter (Signed)
LAST REFILL WITHOUT OFFICE VISIT.  Needs diabetes labs and follow up after April 16. Labs ordered Lab Results  Component Value Date   HGBA1C 8.1* 12/27/2015

## 2016-03-18 ENCOUNTER — Other Ambulatory Visit: Payer: Self-pay | Admitting: Internal Medicine

## 2016-03-27 ENCOUNTER — Other Ambulatory Visit: Payer: Self-pay | Admitting: Nurse Practitioner

## 2016-03-27 NOTE — Telephone Encounter (Signed)
Erroneously sent to me.  Thank you, Morey Hummingbird

## 2016-03-27 NOTE — Telephone Encounter (Signed)
Refilled on 12/2015. Please advise?

## 2016-03-27 NOTE — Telephone Encounter (Signed)
refilled 

## 2016-03-30 ENCOUNTER — Ambulatory Visit (INDEPENDENT_AMBULATORY_CARE_PROVIDER_SITE_OTHER): Payer: BC Managed Care – PPO | Admitting: Podiatry

## 2016-03-30 ENCOUNTER — Encounter: Payer: Self-pay | Admitting: Podiatry

## 2016-03-30 VITALS — BP 90/65 | HR 114 | Resp 18

## 2016-03-30 DIAGNOSIS — M779 Enthesopathy, unspecified: Secondary | ICD-10-CM

## 2016-03-30 DIAGNOSIS — S93402S Sprain of unspecified ligament of left ankle, sequela: Secondary | ICD-10-CM | POA: Diagnosis not present

## 2016-03-30 DIAGNOSIS — S93402A Sprain of unspecified ligament of left ankle, initial encounter: Secondary | ICD-10-CM | POA: Insufficient documentation

## 2016-03-30 NOTE — Progress Notes (Signed)
Patient ID: Maria Bentley, female   DOB: 1971-02-21, 45 y.o.   MRN: [id number]  Subjective: 45 year old female presents the office in follow-up evaluation of right ankle pain and left foot pain. She states the left ankle is doing well, and she is having no pain on the left. On the right she is still getting some pain to the outside of her foot. She states it has moved "forward" some. No recent injury or trauma. No overlying swelling or redness. No tingling or numbness. She has a feels of the orthotic on the right side she is still pronating some. Denies any systemic complaints such as fevers, chills, nausea, vomiting. No acute changes since last appointment, and no other complaints at this time.   Objective: AAO x3, NAD DP/PT pulses palpable bilaterally, CRT less than 3 seconds There is no tenderness palpation of the right ankle. Range of motion intact.  Left side there is tenderness on the plantar aspect of the fifth metatarsal just distal to the base. There is no overlying edema, erythema, increase in warmth. There is no area pinpoint any tenderness there is no pain vibratory sensation. There is no overlying edema, erythema, increase in warmth. No edema, erythema, increase in warmth to bilateral lower extremities.  No open lesions or pre-ulcerative lesions.  No pain with calf compression, swelling, warmth, erythema  Assessment: Right peroneal tendinitis with some residual paint; left ankle sprain which as resolved.   Plan: -All treatment options discussed with the patient including all alternatives, risks, complications.  -Discussed steroid injection to the right foot upon area of maximal tenderness is is away from the area at the previous injection. She wishes to proceed understanding risks. Under sterile conditions a mixture Dexon is an phosphate and local anesthetic was infiltrated into the area of maximal tenderness without complications. Post injection care discussed. Plantar fascial  brace dispensed to wear medial to lateral. -Sent back her right orthotic to increase the arch -Continue range of motion exercises and left side. -Follow-up in 3 weeks -Patient encouraged to call the office with any questions, concerns, change in symptoms.   Celesta Gentile, DPM

## 2016-04-09 ENCOUNTER — Other Ambulatory Visit: Payer: Self-pay | Admitting: Internal Medicine

## 2016-04-11 ENCOUNTER — Encounter: Payer: Self-pay | Admitting: Internal Medicine

## 2016-04-17 ENCOUNTER — Other Ambulatory Visit: Payer: Self-pay | Admitting: Internal Medicine

## 2016-04-25 ENCOUNTER — Ambulatory Visit: Payer: BC Managed Care – PPO | Admitting: Podiatry

## 2016-04-30 ENCOUNTER — Other Ambulatory Visit: Payer: Self-pay | Admitting: Internal Medicine

## 2016-05-04 ENCOUNTER — Encounter: Payer: Self-pay | Admitting: Podiatry

## 2016-05-04 ENCOUNTER — Ambulatory Visit (INDEPENDENT_AMBULATORY_CARE_PROVIDER_SITE_OTHER): Payer: BC Managed Care – PPO | Admitting: Podiatry

## 2016-05-04 VITALS — BP 129/92 | HR 120 | Resp 18

## 2016-05-04 DIAGNOSIS — S93402S Sprain of unspecified ligament of left ankle, sequela: Secondary | ICD-10-CM | POA: Diagnosis not present

## 2016-05-04 DIAGNOSIS — M7671 Peroneal tendinitis, right leg: Secondary | ICD-10-CM | POA: Diagnosis not present

## 2016-05-09 ENCOUNTER — Other Ambulatory Visit: Payer: Self-pay

## 2016-05-09 MED ORDER — FLUTICASONE PROPIONATE 50 MCG/ACT NA SUSP: 2.0000 | Freq: Every day | NASAL | Status: DC

## 2016-05-10 ENCOUNTER — Other Ambulatory Visit: Payer: BC Managed Care – PPO

## 2016-05-11 ENCOUNTER — Ambulatory Visit: Payer: BC Managed Care – PPO | Admitting: Internal Medicine

## 2016-05-11 NOTE — Progress Notes (Signed)
Patient ID: Maria Bentley, female   DOB: Apr 24, 1971, 45 y.o.   MRN: [id number]  Subjective: 45 year old female presents the office in follow-up evaluation of right ankle pain and left foot pain. She states that she is doing better although she still has some pain. She presented pick up her orthotics as well. She is going to physical therapy for other issues but not for her feet. No recent injury. No swelling. Denies any systemic complaints such as fevers, chills, nausea, vomiting. No acute changes since last appointment, and no other complaints at this time.   Objective: AAO x3, NAD DP/PT pulses palpable bilaterally, CRT less than 3 seconds There is no tenderness palpation of the right ankle. Range of motion intact.  Right side there is tenderness on the plantar aspect of the fifth metatarsal just proximal to the base. There is no overlying edema, erythema, increase in warmth. There is no area pinpoint any tenderness there is no pain vibratory sensation. There is no overlying edema, erythema.  The left signs or symptoms mild discomfort on the course the ATFL and the previous ankle sprain. No area pinpoint any tenderness. No edema, erythema, increase in warmth to bilateral lower extremities.  No open lesions or pre-ulcerative lesions.  No pain with calf compression, swelling, warmth, erythema  Assessment: Right peroneal tendinitis with some residual paint; left ankle sprain   Plan: -All treatment options discussed with the patient including all alternatives, risks, complications.  -For orthotic was dispensed today to her as was modified. Continue to break this and also discussed shoe gear modifications. -Continue ice and elevation -She is currently going to physical therapy for other issues and I believe this will help her in her feet as well. A prescription was provided to her today and hopes that they can start physical therapy for her feet as well. -Follow-up in 4 weeks or sooner if any  issues are to arise.  Celesta Gentile, DPM

## 2016-05-15 ENCOUNTER — Ambulatory Visit: Payer: BC Managed Care – PPO | Admitting: Internal Medicine

## 2016-05-16 ENCOUNTER — Other Ambulatory Visit: Payer: Self-pay | Admitting: Internal Medicine

## 2016-05-17 NOTE — Telephone Encounter (Signed)
Last OV 12/27/15 ok to fill tramadol?

## 2016-05-17 NOTE — Telephone Encounter (Signed)
Refill for 30 days only.  OFFICE VISIT NEEDED prior to any more refills 

## 2016-05-24 ENCOUNTER — Other Ambulatory Visit: Payer: Self-pay | Admitting: Internal Medicine

## 2016-06-01 ENCOUNTER — Ambulatory Visit: Payer: BC Managed Care – PPO | Admitting: Podiatry

## 2016-06-01 ENCOUNTER — Telehealth: Payer: Self-pay | Admitting: Internal Medicine

## 2016-06-01 DIAGNOSIS — E1121 Type 2 diabetes mellitus with diabetic nephropathy: Secondary | ICD-10-CM

## 2016-06-01 DIAGNOSIS — E1165 Type 2 diabetes mellitus with hyperglycemia: Principal | ICD-10-CM

## 2016-06-01 DIAGNOSIS — IMO0002 Reserved for concepts with insufficient information to code with codable children: Secondary | ICD-10-CM

## 2016-06-01 NOTE — Telephone Encounter (Signed)
Refill for 30 days only.  OFFICE VISIT NEEDED for diabetes follow up  prior to any more refills fasting labs ordered.

## 2016-06-01 NOTE — Telephone Encounter (Signed)
Refill request for Imitrex and Phenegran, last seen [id number], last filled Imitrex [id number] Phenegran [id number].  Please advise.

## 2016-06-22 ENCOUNTER — Other Ambulatory Visit: Payer: Self-pay | Admitting: Internal Medicine

## 2016-06-23 ENCOUNTER — Other Ambulatory Visit: Payer: Self-pay | Admitting: Internal Medicine

## 2016-06-25 ENCOUNTER — Other Ambulatory Visit: Payer: Self-pay | Admitting: Internal Medicine

## 2016-06-26 ENCOUNTER — Other Ambulatory Visit: Payer: Self-pay | Admitting: Internal Medicine

## 2016-06-26 NOTE — Telephone Encounter (Signed)
No refills on the tramadol ,  Last refill was a warning that she needed to be seen

## 2016-06-26 NOTE — Telephone Encounter (Signed)
Patient has an appointment on Friday and labs tomorrow.

## 2016-06-26 NOTE — Telephone Encounter (Signed)
Last OV with PCP 1/17 ok to fill?

## 2016-06-27 ENCOUNTER — Other Ambulatory Visit (INDEPENDENT_AMBULATORY_CARE_PROVIDER_SITE_OTHER): Payer: BC Managed Care – PPO

## 2016-06-27 DIAGNOSIS — E1100 Type 2 diabetes mellitus with hyperosmolarity without nonketotic hyperglycemic-hyperosmolar coma (NKHHC): Secondary | ICD-10-CM | POA: Diagnosis not present

## 2016-06-27 DIAGNOSIS — IMO0002 Reserved for concepts with insufficient information to code with codable children: Secondary | ICD-10-CM

## 2016-06-27 DIAGNOSIS — E1165 Type 2 diabetes mellitus with hyperglycemia: Secondary | ICD-10-CM

## 2016-06-27 DIAGNOSIS — E1121 Type 2 diabetes mellitus with diabetic nephropathy: Secondary | ICD-10-CM | POA: Diagnosis not present

## 2016-06-27 LAB — COMPREHENSIVE METABOLIC PANEL
ALBUMIN: 3.7 g/dL (ref 3.5–5.2)
ALK PHOS: 112 U/L (ref 39–117)
ALT: 16 U/L (ref 0–35)
AST: 16 U/L (ref 0–37)
BILIRUBIN TOTAL: 0.3 mg/dL (ref 0.2–1.2)
BUN: 26 mg/dL — AB (ref 6–23)
CO2: 29 mEq/L (ref 19–32)
CREATININE: 1.17 mg/dL (ref 0.40–1.20)
Calcium: 9.6 mg/dL (ref 8.4–10.5)
Chloride: 98 mEq/L (ref 96–112)
GFR: 52.94 mL/min — ABNORMAL LOW (ref >60.00)
Glucose, Bld: 269 mg/dL — ABNORMAL HIGH (ref 70–99)
Potassium: 4.4 mEq/L (ref 3.5–5.1)
SODIUM: 135 meq/L (ref 135–145)
TOTAL PROTEIN: 7.1 g/dL (ref 6.0–8.3)

## 2016-06-27 LAB — LIPID PANEL
CHOLESTEROL: 142 mg/dL (ref 0–200)
HDL: 31.2 mg/dL — ABNORMAL LOW (ref >39.00)
LDL Cholesterol: 81 mg/dL (ref 0–99)
NonHDL: 111.23
Total CHOL/HDL Ratio: 5
Triglycerides: 150 mg/dL — ABNORMAL HIGH (ref 0.0–149.0)
VLDL: 30 mg/dL (ref 0.0–40.0)

## 2016-06-27 LAB — MICROALBUMIN / CREATININE URINE RATIO
CREATININE, U: 74.8 mg/dL
MICROALB/CREAT RATIO: 0.9 mg/g (ref 0.0–30.0)

## 2016-06-27 LAB — HEMOGLOBIN A1C: HEMOGLOBIN A1C: 9.1 % — AB (ref 4.6–6.5)

## 2016-06-29 ENCOUNTER — Other Ambulatory Visit: Payer: Self-pay | Admitting: Internal Medicine

## 2016-06-29 ENCOUNTER — Encounter: Payer: Self-pay | Admitting: Internal Medicine

## 2016-06-29 DIAGNOSIS — Z794 Long term (current) use of insulin: Principal | ICD-10-CM

## 2016-06-29 DIAGNOSIS — E1169 Type 2 diabetes mellitus with other specified complication: Principal | ICD-10-CM

## 2016-06-29 DIAGNOSIS — IMO0002 Reserved for concepts with insufficient information to code with codable children: Secondary | ICD-10-CM

## 2016-06-29 DIAGNOSIS — E1165 Type 2 diabetes mellitus with hyperglycemia: Secondary | ICD-10-CM

## 2016-06-30 ENCOUNTER — Ambulatory Visit (INDEPENDENT_AMBULATORY_CARE_PROVIDER_SITE_OTHER): Payer: BC Managed Care – PPO | Admitting: Internal Medicine

## 2016-06-30 ENCOUNTER — Encounter: Payer: Self-pay | Admitting: Internal Medicine

## 2016-06-30 DIAGNOSIS — E785 Hyperlipidemia, unspecified: Secondary | ICD-10-CM

## 2016-06-30 DIAGNOSIS — E1121 Type 2 diabetes mellitus with diabetic nephropathy: Secondary | ICD-10-CM

## 2016-06-30 DIAGNOSIS — E1165 Type 2 diabetes mellitus with hyperglycemia: Secondary | ICD-10-CM

## 2016-06-30 MED ORDER — PROMETHAZINE HCL 25 MG PO TABS: 25.0000 mg | ORAL_TABLET | Freq: Three times a day (TID) | ORAL | Status: DC | PRN

## 2016-06-30 MED ORDER — NYSTATIN 100000 UNIT/GM EX POWD: Freq: Two times a day (BID) | CUTANEOUS | Status: DC

## 2016-06-30 MED ORDER — TRAMADOL HCL 50 MG PO TABS: 50.0000 mg | ORAL_TABLET | Freq: Four times a day (QID) | ORAL | Status: DC | PRN

## 2016-06-30 NOTE — Progress Notes (Signed)
Subjective:  Patient ID: Maria Bentley, female    DOB: 1971-03-19  Age: 45 y.o. MRN: [id number]  CC: Diagnoses of Uncontrolled type 2 diabetes mellitus with diabetic nephropathy, unspecified long term insulin use status ([place]), Hyperlipidemia, and Morbid obesity, unspecified obesity type ([place]) were pertinent to this visit.  HPI Maria Bentley presents for follow up on uncontrolled type DM on mealtime and bedtime insulin  A1c now 9.1 .  States that she was "doing real well' until 6-8 weeks ago until the migraine treatment included steroid injections in scalp, neck and upper shoulders.  Sphenocap  Injection ,  Trigeminal pain improved.  .  Sugars were actually  dangerously low prior to 6 weeks ago (as low as 30-40) . Has been increasing her dose since then with no treatment   Has requested discontinuation of steroid from neurologist.  Last steroid dose was 2 weeks ago   Had an abscessed tooth pulled.  Lots of financial stressors due ot health issues.   Now Using 30 units of Novolog with each meal  Via sliding scale for the last 3 weeks . Using 65 units NPH .at 10,   pm  Most recent fasting  cbg was 292  Morbid obesity:  Started Weight [employer] in April and Has lost 14 lbs since last visit .  Eating 3-4 ounces of protein and a small serving of salad or fruit.  Broth based soups. Eating 2-3 meals daily often misses breakfast bc she is not sleeping well due to FM pain .  Back pain with left sided sciatica,  Had PT.  [employer] denied MRI until PT was completed which costher  $52/session every 2 weeks x 6 weeks      Outpatient Medications Prior to Visit  Medication Sig Dispense Refill  . acetaminophen (TYLENOL) 325 MG tablet Take 650 mg by mouth every 6 (six) hours as needed.    Marland Kitchen atorvastatin (LIPITOR) 20 MG tablet Take 1 tablet (20 mg total) by mouth daily. 90 tablet 3  . azelastine (ASTELIN) 0.1 % nasal spray Place 2 sprays into both nostrils 2 (two) times daily. Use in each nostril  as directed    . B-D INS SYR MICROFINE 1CC/28G 28G X 1/2" 1 ML MISC USE 1 SYRINGE DAILY AFTER SUPPER 100 each 1  . B-D INS SYR MICROFINE 1CC/28G 28G X 1/2" 1 ML MISC USE 1 SYRINGE DAILY AFTER SUPPER 100 each 11  . buPROPion (WELLBUTRIN XL) 300 MG 24 hr tablet Take 450 mg by mouth daily.     . cyanocobalamin (,VITAMIN B-12,) 1000 MCG/ML injection Inject 1 mL (1,000 mcg total) into the muscle once. Weekly for 3 weeks,  Then monthly thereafter 10 mL 2  . fluticasone (FLONASE) 50 MCG/ACT nasal spray Place 2 sprays into both nostrils daily. 16 g 3  . furosemide (LASIX) 20 MG tablet TAKE 1 TABLET (20 MG TOTAL) BY MOUTH DAILY. 30 tablet 3  . hydrochlorothiazide (MICROZIDE) 12.5 MG capsule Take 1 capsule (12.5 mg total) by mouth daily. 90 capsule 1  . Insulin Pen Needle (PEN NEEDLES) 31G X 6 MM MISC Use with Novolog Flexpen as directed 100 each 5  . Insulin Syringe-Needle U-100 (B-D INS SYR ULTRAFINE 1CC/31G) 31G X 5/16" 1 ML MISC 1 Syringe by Does not apply route daily. 100 each 0  . Lactulose 20 GM/30ML SOLN 30 ml every 4 hours until constipation is relieved 236 mL 3  . lamoTRIgine (LAMICTAL) 100 MG tablet Take 150 mg by mouth daily.     Marland Kitchen  Lancet Device MISC Test blood sugar two times daily 100 each 6  . lisinopril (PRINIVIL,ZESTRIL) 5 MG tablet Take 1 tablet (5 mg total) by mouth daily. 90 tablet 3  . LORazepam (ATIVAN) 0.5 MG tablet Take 0.5 mg by mouth daily. ! To 2 tablets per day as needed for anxiety.    . meclizine (ANTIVERT) 25 MG tablet Take 1 tablet (25 mg total) by mouth 3 (three) times daily as needed for dizziness. 90 tablet 6  . metFORMIN (GLUMETZA) 1000 MG (MOD) 24 hr tablet Take 2 tablets (2,000 mg total) by mouth daily with breakfast. 60 tablet 5  . methocarbamol (ROBAXIN) 500 MG tablet TAKE ONE TABLET BY MOUTH FOUR TIMES DAILY  (Patient taking differently: Take one or two tabs four times daily, as needed) 90 tablet 3  . norethindrone (MICRONOR,CAMILA,ERRIN) 0.35 MG tablet Take 1 tablet  by mouth daily.    Marland Kitchen NOVOLIN N 100 UNIT/ML injection INJECT 55 UNITS INTO THE SKIN AT BEDTIME AS DIRECTED 20 mL 5  . NOVOLOG FLEXPEN 100 UNIT/ML FlexPen INJECT 20 UNITS INTO THE SKIN 3 (THREE) TIMES DAILY WITH MEALS. 90 mL 9  . OnabotulinumtoxinA (BOTOX IJ) Inject as directed every 3 (three) months. For migraine    . ONETOUCH VERIO test strip TEST BLOOD SUGAR TWICE DAILY 100 each 5  . oxyCODONE-acetaminophen (PERCOCET) 10-325 MG tablet Take 1 tablet by mouth every 6 (six) hours as needed for pain.    . pregabalin (LYRICA) 50 MG capsule Take 300 mg by mouth at bedtime.     . Prenatal Vit-Fe Fumarate-FA (PRENATAL MULTIVITAMIN) TABS Take 1 tablet by mouth daily.    . SUMAtriptan (IMITREX) 100 MG tablet TAKE 1 TABLET BY MOUTH EVERY DAY AS NEEDED MAY REPEAT IN 2HRS IF HEADACHE PERSISTS. MAX 2 TABS/24 HR 9 tablet 0  . Syringe, Disposable, 3 ML MISC For use with B12 injections weekly/monthly 25 each 1  . VENTOLIN HFA 108 (90 BASE) MCG/ACT inhaler INHALE ONE PUFF BY MOUTH EVERY SIX HOURS AS NEEDED FOR WHEEZING  18 g 3  . Vitamin D, Ergocalciferol, (DRISDOL) 50000 units CAPS capsule TAKE 1 CAPSULE BY MOUTH ONCE A WEEK. 12 capsule 0  . nystatin (MYCOSTATIN/NYSTOP) 100000 UNIT/GM POWD APPLY TO AFFECTED AREA TWICE A DAY 60 g 0  . promethazine (PHENERGAN) 25 MG tablet TAKE ONE TABLET BY MOUTH EVERY EIGHT HOURS AS NEEDED 20 tablet 4  . traMADol (ULTRAM) 50 MG tablet TAKE ONE TABLET BY MOUTH EVERY 6 HOURS AS NEEDED 90 tablet 0  . glipiZIDE (GLUCOTROL) 5 MG tablet TAKE 1 TABLET BY MOUTH TWICE A DAY FOR BEFORE MEALS (Patient not taking: Reported on 06/30/2016) 180 tablet 1  . potassium chloride (MICRO-K) 10 MEQ CR capsule TAKE TWO CAPSULES BY MOUTH DAILY (Patient not taking: Reported on 06/30/2016) 90 capsule 2  . saxagliptin HCl (ONGLYZA) 5 MG TABS tablet Take 1 tablet (5 mg total) by mouth daily. (Patient not taking: Reported on 06/30/2016) 30 tablet 5  . ciprofloxacin-dexamethasone (CIPRODEX) otic suspension Place  4 drops into the right ear 2 (two) times daily. (Patient not taking: Reported on 06/30/2016) 7.5 mL 0  . diclofenac sodium (VOLTAREN) 1 % GEL Apply 2 g topically 4 (four) times daily. Rub into affected area of foot 2 to 4 times daily (Patient not taking: Reported on 06/30/2016) 100 g 2  . EPINEPHrine (EPIPEN 2-PAK) 0.3 mg/0.3 mL SOAJ injection Inject 0.3 mLs (0.3 mg total) into the muscle once. For severe allergic reaction (Patient not taking: Reported on  06/30/2016) 1 Device 5  . NYSTATIN powder APPLY TO AFFECTED AREA TWICE A DAY 60 g 2  . promethazine (PHENERGAN) 25 MG tablet TAKE 1 TABLET (25 MG TOTAL) BY MOUTH EVERY 8 (EIGHT) HOURS AS NEEDED. 20 tablet 0   No facility-administered medications prior to visit.     Review of Systems;  Patient denies headache, fevers, malaise, unintentional weight loss, skin rash, eye pain, sinus congestion and sinus pain, sore throat, dysphagia,  hemoptysis , cough, dyspnea, wheezing, chest pain, palpitations, orthopnea, edema, abdominal pain, nausea, melena, diarrhea, constipation, flank pain, dysuria, hematuria, urinary  Frequency, nocturia, numbness, tingling, seizures,  Focal weakness, Loss of consciousness,  Tremor, insomnia, depression, anxiety, and suicidal ideation.      Objective:  BP (!) 122/92   Pulse (!) 104   Temp 97.8 F (36.6 C) (Oral)   Resp 16   Wt 254 lb (115.2 kg)   LMP 05/09/2016 Comment: are irregular  BMI 43.60 kg/m   BP Readings from Last 3 Encounters:  06/30/16 (!) 122/92  05/04/16 (!) 129/92  03/30/16 90/65    Wt Readings from Last 3 Encounters:  06/30/16 254 lb (115.2 kg)  01/13/16 268 lb 3.2 oz (121.7 kg)  01/13/16 259 lb (117.5 kg)    General appearance: alert, cooperative and appears stated age Ears: normal TM's and external ear canals both ears Throat: lips, mucosa, and tongue normal; teeth and gums normal Neck: no adenopathy, no carotid bruit, supple, symmetrical, trachea midline and thyroid not enlarged,  symmetric, no tenderness/mass/nodules Back: symmetric, no curvature. ROM normal. No CVA tenderness. Lungs: clear to auscultation bilaterally Heart: regular rate and rhythm, S1, S2 normal, no murmur, click, rub or gallop Abdomen: soft, non-tender; bowel sounds normal; no masses,  no organomegaly Pulses: 2+ and symmetric Skin: Skin color, texture, turgor normal. No rashes or lesions Lymph nodes: Cervical, supraclavicular, and axillary nodes normal.  Lab Results  Component Value Date   HGBA1C 9.1 (H) 06/27/2016   HGBA1C 8.1 (H) 12/27/2015   HGBA1C 9.2 (H) 09/08/2015    Lab Results  Component Value Date   CREATININE 1.17 06/27/2016   CREATININE 1.08 12/27/2015   CREATININE 0.86 08/25/2015    Lab Results  Component Value Date   WBC 8.3 08/25/2015   HGB 11.8 (L) 08/25/2015   HCT 37.7 08/25/2015   PLT 527.0 (H) 08/25/2015   GLUCOSE 269 (H) 06/27/2016   CHOL 142 06/27/2016   TRIG 150.0 (H) 06/27/2016   HDL 31.20 (L) 06/27/2016   LDLDIRECT 102.0 12/27/2015   LDLCALC 81 06/27/2016   ALT 16 06/27/2016   AST 16 06/27/2016   NA 135 06/27/2016   K 4.4 06/27/2016   CL 98 06/27/2016   CREATININE 1.17 06/27/2016   BUN 26 (H) 06/27/2016   CO2 29 06/27/2016   TSH 1.91 05/27/2014   HGBA1C 9.1 (H) 06/27/2016   MICROALBUR <0.7 06/27/2016    Dg Foot Complete Right  12/28/2015  CLINICAL DATA:  Right foot pain, no injury. EXAM: RIGHT FOOT COMPLETE - 3+ VIEW COMPARISON:  None. FINDINGS: There is no evidence of fracture or dislocation. There is a small plantar calcaneal spur. Soft tissues are unremarkable. IMPRESSION: No acute fracture or dislocation. Electronically Signed   By: Abelardo Diesel M.D.   On: 12/28/2015 16:25   Dg Hip Unilat With Pelvis 2-3 Views Left  12/28/2015  CLINICAL DATA:  Patient with hip pain. No fall. No injury. Initial encounter. EXAM: DG HIP (WITH OR WITHOUT PELVIS) 2-3V LEFT COMPARISON:  None. FINDINGS: Normal  anatomic alignment. No evidence for acute fracture or  dislocation. Mild degenerative changes bilateral hip joints. SI joints are unremarkable. Pelvic ring intact. IMPRESSION: No acute osseous abnormality. Electronically Signed   By: Lovey Newcomer M.D.   On: 12/28/2015 16:25    Assessment & Plan:   Problem List Items Addressed This Visit    Hyperlipidemia    LDL and triglycerides are at  Goal with resuming of atorvastatin   Lab Results  Component Value Date   CHOL 142 06/27/2016   HDL 31.20 (L) 06/27/2016   LDLCALC 81 06/27/2016   LDLDIRECT 102.0 12/27/2015   TRIG 150.0 (H) 06/27/2016   CHOLHDL 5 06/27/2016   Lab Results  Component Value Date   ALT 16 06/27/2016   AST 16 06/27/2016   ALKPHOS 112 06/27/2016   BILITOT 0.3 06/27/2016            Morbid obesity ([place])    I have addressed  BMI and recommended wt loss of 10% of body weigh over the next 6 months using a low glycemic index diet and regular exercise a minimum of 5 days per week.        Diabetes mellitus type 2, uncontrolled ([place])    Uncontrolled due to recent use of steroids, increasing weight, and noncompliance with insulin regimen.  She skips breakfast and the Novolog sliding scale often  Int he am because she sleeps late   Increase NPH to 70 units and add 15 units of NPH in the morning  .  refer to Pecola Lawless.        Other Visit Diagnoses   None.     I have discontinued Ms. Nishida's EPINEPHrine, diclofenac sodium, ciprofloxacin-dexamethasone, promethazine, and nystatin. I have also changed her promethazine, traMADol, and nystatin. Additionally, I am having her maintain her prenatal multivitamin, acetaminophen, Lancet Device, potassium chloride, methocarbamol, Insulin Syringe-Needle U-100, pregabalin, OnabotulinumtoxinA (BOTOX IJ), VENTOLIN HFA, azelastine, norethindrone, lamoTRIgine, LORazepam, lisinopril, Pen Needles, meclizine, buPROPion, oxyCODONE-acetaminophen, atorvastatin, cyanocobalamin, Syringe (Disposable), metFORMIN, saxagliptin HCl, Lactulose, B-D  INS SYR MICROFINE 1CC/28G, hydrochlorothiazide, ONETOUCH VERIO, Vitamin D (Ergocalciferol), B-D INS SYR MICROFINE 1CC/28G, glipiZIDE, NOVOLIN N, fluticasone, furosemide, NOVOLOG FLEXPEN, and SUMAtriptan.  Meds ordered this encounter  Medications  . promethazine (PHENERGAN) 25 MG tablet    Sig: Take 1 tablet (25 mg total) by mouth every 8 (eight) hours as needed.    Dispense:  20 tablet    Refill:  4  . traMADol (ULTRAM) 50 MG tablet    Sig: Take 1 tablet (50 mg total) by mouth every 6 (six) hours as needed.    Dispense:  90 tablet    Refill:  5  . nystatin (NYSTATIN) powder    Sig: Apply topically 2 (two) times daily.    Dispense:  60 g    Refill:  5    KEEP ON FILE FOR FUTURE REFILLS   A total of 25 minutes of face to face time was spent with patient more than half of which was spent in counselling and coordination of care   Medications Discontinued During This Encounter  Medication Reason  . ciprofloxacin-dexamethasone (CIPRODEX) otic suspension Completed Course  . NYSTATIN powder Duplicate - PRN Policy  . promethazine (PHENERGAN) 25 MG tablet Duplicate  . promethazine (PHENERGAN) 25 MG tablet Reorder  . traMADol (ULTRAM) 50 MG tablet Reorder  . nystatin (MYCOSTATIN/NYSTOP) 100000 UNIT/GM POWD Reorder  . EPINEPHrine (EPIPEN 2-PAK) 0.3 mg/0.3 mL SOAJ injection   . diclofenac sodium (VOLTAREN) 1 % GEL  Follow-up: Return in about 3 months (around 09/30/2016) for follow up diabetes.   [name], MD

## 2016-06-30 NOTE — Patient Instructions (Addendum)
Please change your evening dose of  NPH  70  units,  And increase by 5 units every week until your fastings are < 200.    Add 15 units in the morning of NPH if you are skipping breakfast. Use your sliding scale short acting insulin at lunchtime     You might want to try a premixed protein drink called Premier Protein shake in the morning.  It is less $$$ and very low sugar.  160 cal  30 g protein  1 g sugar 50% calcium needs  Available at [facility].  [employer] etc   USE GOLD BOND POWER WITH ZINC ON AREAS AFTER TREATING FOR [place]

## 2016-07-02 NOTE — Assessment & Plan Note (Addendum)
Uncontrolled due to recent use of steroids, increasing weight, and noncompliance with insulin regimen.  She skips breakfast and the Novolog sliding scale often  Int he am because she sleeps late   Increase NPH to 70 units and add 15 units of NPH in the morning  .  refer to Maria Bentley.

## 2016-07-02 NOTE — Assessment & Plan Note (Signed)
I have addressed  BMI and recommended wt loss of 10% of body weigh over the next 6 months using a low glycemic index diet and regular exercise a minimum of 5 days per week.   

## 2016-07-02 NOTE — Assessment & Plan Note (Signed)
LDL and triglycerides are at  Goal with resuming of atorvastatin   Lab Results  Component Value Date   CHOL 142 06/27/2016   HDL 31.20 (L) 06/27/2016   LDLCALC 81 06/27/2016   LDLDIRECT 102.0 12/27/2015   TRIG 150.0 (H) 06/27/2016   CHOLHDL 5 06/27/2016   Lab Results  Component Value Date   ALT 16 06/27/2016   AST 16 06/27/2016   ALKPHOS 112 06/27/2016   BILITOT 0.3 06/27/2016

## 2016-07-14 ENCOUNTER — Other Ambulatory Visit: Payer: Self-pay | Admitting: Internal Medicine

## 2016-07-22 ENCOUNTER — Other Ambulatory Visit: Payer: Self-pay | Admitting: Internal Medicine

## 2016-07-27 ENCOUNTER — Other Ambulatory Visit: Payer: Self-pay

## 2016-07-27 MED ORDER — FUROSEMIDE 20 MG PO TABS: ORAL_TABLET | ORAL | 3 refills | Status: DC

## 2016-08-09 ENCOUNTER — Telehealth: Payer: Self-pay | Admitting: *Deleted

## 2016-08-09 NOTE — Telephone Encounter (Signed)
Attempted to reach patient, left a detailed message with PCP response. thanks

## 2016-08-09 NOTE — Telephone Encounter (Signed)
Due to insurance issues, pt requested to have her metformin changed to the generic glucotrol.

## 2016-08-09 NOTE — Telephone Encounter (Signed)
Will not for the following reasons:   1) glucotrol and Glumetza  are 2 different medications,  (glumetza is metformin)  And glucotrol is not advised with concurrent use of insulin due to risk of low blood sugars  2) her diabetes management has been turned over to Dr Renne Crigler,  [place] endocrinologist and her first  appt is Sept 5th. See referral note

## 2016-08-09 NOTE — Telephone Encounter (Signed)
Patient was taking Metformin Glumetza 24hour 2 tablets 2000mg  daily, would like it changed to Glucotrol. Please advise, thanks

## 2016-08-10 ENCOUNTER — Telehealth: Payer: Self-pay | Admitting: Internal Medicine

## 2016-08-10 NOTE — Telephone Encounter (Signed)
Maria Bentley called from [facility] regarding pt PA metFORMIN (GLUMETZA) 1000 MG (MOD) 24 hr tablet to give information to complete the process. Ref # [id number] Thank you!

## 2016-08-14 ENCOUNTER — Other Ambulatory Visit: Payer: Self-pay | Admitting: Internal Medicine

## 2016-08-15 ENCOUNTER — Ambulatory Visit: Payer: BC Managed Care – PPO | Admitting: Internal Medicine

## 2016-08-15 NOTE — Telephone Encounter (Signed)
Filled and last OV 7/21 #90 +5. Ok to refill?

## 2016-08-16 NOTE — Telephone Encounter (Signed)
If she was given 5 refills,  She should not be out of refills please clarify with pharmacy

## 2016-08-17 NOTE — Telephone Encounter (Signed)
Approved for 12 months, for metformin. thanks

## 2016-08-22 ENCOUNTER — Other Ambulatory Visit: Payer: Self-pay

## 2016-08-22 ENCOUNTER — Encounter: Payer: Self-pay | Admitting: Internal Medicine

## 2016-08-22 MED ORDER — BLOOD GLUCOSE MONITOR KIT: PACK | 0 refills | Status: DC

## 2016-08-28 ENCOUNTER — Encounter: Payer: Self-pay | Admitting: Obstetrics and Gynecology

## 2016-08-31 ENCOUNTER — Other Ambulatory Visit: Payer: Self-pay | Admitting: Internal Medicine

## 2016-09-04 ENCOUNTER — Other Ambulatory Visit: Payer: Self-pay | Admitting: Internal Medicine

## 2016-09-05 NOTE — Telephone Encounter (Signed)
Refilled 07/24/16. Pt last seen 06/30/16. Please advise?

## 2016-09-12 ENCOUNTER — Encounter: Payer: Self-pay | Admitting: Obstetrics and Gynecology

## 2016-09-24 ENCOUNTER — Other Ambulatory Visit: Payer: Self-pay | Admitting: Internal Medicine

## 2016-09-27 ENCOUNTER — Ambulatory Visit: Payer: Self-pay | Admitting: Internal Medicine

## 2016-09-28 ENCOUNTER — Ambulatory Visit: Payer: BC Managed Care – PPO | Admitting: Internal Medicine

## 2016-09-29 ENCOUNTER — Other Ambulatory Visit: Payer: Self-pay | Admitting: Internal Medicine

## 2016-10-01 ENCOUNTER — Other Ambulatory Visit: Payer: Self-pay | Admitting: Internal Medicine

## 2016-10-09 ENCOUNTER — Telehealth: Payer: Self-pay | Admitting: Internal Medicine

## 2016-10-09 ENCOUNTER — Other Ambulatory Visit: Payer: Self-pay

## 2016-10-09 NOTE — Telephone Encounter (Signed)
Last refill and OV  06/30/16 #20 +4, ok to refill?

## 2016-10-09 NOTE — Telephone Encounter (Signed)
Pharmacy called with a refill request for promethazine (PHENERGAN) 25 MG tablet. Thank you!  Ph[facility]acy - [facility] [place], [place]

## 2016-10-10 ENCOUNTER — Other Ambulatory Visit: Payer: Self-pay | Admitting: Internal Medicine

## 2016-10-10 MED ORDER — PROMETHAZINE HCL 25 MG PO TABS: 25.0000 mg | ORAL_TABLET | Freq: Three times a day (TID) | ORAL | 0 refills | Status: DC | PRN

## 2016-10-13 ENCOUNTER — Ambulatory Visit: Payer: BC Managed Care – PPO | Admitting: Internal Medicine

## 2016-10-16 ENCOUNTER — Other Ambulatory Visit: Payer: Self-pay | Admitting: Internal Medicine

## 2016-11-07 ENCOUNTER — Telehealth: Payer: Self-pay | Admitting: Internal Medicine

## 2016-11-07 MED ORDER — METFORMIN HCL ER (MOD) 1000 MG PO TB24: 2000.0000 mg | ORAL_TABLET | Freq: Every day | ORAL | 1 refills | Status: DC

## 2016-11-07 NOTE — Telephone Encounter (Signed)
Pharmacy called looking for a refill request for pt metFORMIN (GLUMETZA) 1000 MG (MOD) 24 hr tablet for a 90 day supply. Please advise, thank you!  Pharmacy - [facility] [place], [place]

## 2016-11-07 NOTE — Telephone Encounter (Signed)
Refill sent; 90 day supply.

## 2016-11-13 ENCOUNTER — Ambulatory Visit: Payer: BC Managed Care – PPO | Admitting: Internal Medicine

## 2016-11-16 ENCOUNTER — Other Ambulatory Visit: Payer: Self-pay | Admitting: Internal Medicine

## 2016-11-17 NOTE — Telephone Encounter (Signed)
OK to refill promethazine? Last OV 7/17

## 2016-12-09 ENCOUNTER — Other Ambulatory Visit: Payer: Self-pay | Admitting: Internal Medicine

## 2016-12-24 ENCOUNTER — Other Ambulatory Visit: Payer: Self-pay | Admitting: Internal Medicine

## 2016-12-25 NOTE — Telephone Encounter (Signed)
Refilled 11/17/16. Pt last seen 06/30/16. Please advise?

## 2016-12-26 NOTE — Telephone Encounter (Signed)
Denied . Patient overdue for diabetes follow up,  No refills until seen

## 2016-12-29 NOTE — Telephone Encounter (Signed)
A voicemail was left for patient to schedule a diabetic follow up with Dr.Tullo.

## 2017-01-03 ENCOUNTER — Other Ambulatory Visit: Payer: Self-pay | Admitting: Internal Medicine

## 2017-01-09 ENCOUNTER — Other Ambulatory Visit: Payer: Self-pay | Admitting: Internal Medicine

## 2017-01-10 ENCOUNTER — Encounter: Payer: Self-pay | Admitting: Radiology

## 2017-01-10 NOTE — Telephone Encounter (Signed)
PT was sent my chart message to schedule OV for any more further refills.

## 2017-01-10 NOTE — Telephone Encounter (Signed)
Pt last refill of Phenergan was on 11/17/16. Pt last OV was on 06/30/16 and last labs were on 06/27/16. Pt has no future scheduled appts at this time.

## 2017-01-10 NOTE — Telephone Encounter (Signed)
REFUSED,  FOR THE SECOND TIME, PATIENT NEEDS TO BE CALLED AND TOLD THAT IT WILL NOT BE REFILLED UNTIL SHE IS SEEN

## 2017-01-12 ENCOUNTER — Other Ambulatory Visit: Payer: Self-pay | Admitting: Internal Medicine

## 2017-02-02 DIAGNOSIS — M545 Low back pain, unspecified: Secondary | ICD-10-CM | POA: Insufficient documentation

## 2017-02-19 ENCOUNTER — Ambulatory Visit: Payer: Self-pay | Admitting: Internal Medicine

## 2017-02-19 ENCOUNTER — Ambulatory Visit: Payer: BC Managed Care – PPO | Admitting: Internal Medicine

## 2017-02-19 ENCOUNTER — Other Ambulatory Visit: Payer: Self-pay | Admitting: Internal Medicine

## 2017-02-21 ENCOUNTER — Ambulatory Visit (INDEPENDENT_AMBULATORY_CARE_PROVIDER_SITE_OTHER): Payer: BC Managed Care – PPO | Admitting: Internal Medicine

## 2017-02-21 ENCOUNTER — Encounter: Payer: Self-pay | Admitting: Internal Medicine

## 2017-02-21 VITALS — BP 112/82 | HR 99 | Resp 17 | Ht 64.0 in | Wt 264.0 lb

## 2017-02-21 DIAGNOSIS — E1165 Type 2 diabetes mellitus with hyperglycemia: Secondary | ICD-10-CM

## 2017-02-21 DIAGNOSIS — E1169 Type 2 diabetes mellitus with other specified complication: Secondary | ICD-10-CM | POA: Diagnosis not present

## 2017-02-21 DIAGNOSIS — Z91199 Patient's noncompliance with other medical treatment and regimen due to unspecified reason: Secondary | ICD-10-CM

## 2017-02-21 DIAGNOSIS — E1121 Type 2 diabetes mellitus with diabetic nephropathy: Secondary | ICD-10-CM | POA: Diagnosis not present

## 2017-02-21 DIAGNOSIS — E785 Hyperlipidemia, unspecified: Secondary | ICD-10-CM | POA: Diagnosis not present

## 2017-02-21 DIAGNOSIS — Z9119 Patient's noncompliance with other medical treatment and regimen: Secondary | ICD-10-CM

## 2017-02-21 DIAGNOSIS — E119 Type 2 diabetes mellitus without complications: Secondary | ICD-10-CM | POA: Diagnosis not present

## 2017-02-21 LAB — COMPREHENSIVE METABOLIC PANEL
ALT: 14 U/L (ref 0–35)
AST: 11 U/L (ref 0–37)
Albumin: 4.2 g/dL (ref 3.5–5.2)
Alkaline Phosphatase: 111 U/L (ref 39–117)
BILIRUBIN TOTAL: 0.3 mg/dL (ref 0.2–1.2)
BUN: 25 mg/dL — ABNORMAL HIGH (ref 6–23)
CALCIUM: 10.3 mg/dL (ref 8.4–10.5)
CO2: 32 meq/L (ref 19–32)
CREATININE: 1.18 mg/dL (ref 0.40–1.20)
Chloride: 100 mEq/L (ref 96–112)
GFR: 52.27 mL/min — ABNORMAL LOW (ref >60.00)
GLUCOSE: 33 mg/dL — AB (ref 70–99)
Potassium: 4.3 mEq/L (ref 3.5–5.1)
Sodium: 141 mEq/L (ref 135–145)
Total Protein: 7.3 g/dL (ref 6.0–8.3)

## 2017-02-21 LAB — MICROALBUMIN / CREATININE URINE RATIO
Creatinine,U: 69.4 mg/dL
Microalb Creat Ratio: 1.9 mg/g (ref 0.0–30.0)
Microalb, Ur: 1.3 mg/dL (ref 0.0–1.9)

## 2017-02-21 LAB — POCT GLYCOSYLATED HEMOGLOBIN (HGB A1C): Hemoglobin A1C: 8.2

## 2017-02-21 LAB — LIPID PANEL
CHOL/HDL RATIO: 3
Cholesterol: 167 mg/dL (ref 0–200)
HDL: 55.6 mg/dL (ref >39.00)
LDL Cholesterol: 87 mg/dL (ref 0–99)
NONHDL: 111.37
Triglycerides: 122 mg/dL (ref 0.0–149.0)
VLDL: 24.4 mg/dL (ref 0.0–40.0)

## 2017-02-21 LAB — LDL CHOLESTEROL, DIRECT: LDL DIRECT: 92 mg/dL

## 2017-02-21 MED ORDER — GLUCOSE BLOOD VI STRP: ORAL_STRIP | 5 refills | Status: DC

## 2017-02-21 MED ORDER — LIRAGLUTIDE 18 MG/3ML ~~LOC~~ SOPN: 0.6000 mg | PEN_INJECTOR | Freq: Every day | SUBCUTANEOUS | 5 refills | Status: DC

## 2017-02-21 MED ORDER — INSULIN NPH (HUMAN) (ISOPHANE) 100 UNIT/ML ~~LOC~~ SUSP: SUBCUTANEOUS | 5 refills | Status: DC

## 2017-02-21 MED ORDER — PEN NEEDLES 31G X 6 MM MISC: 0 refills | Status: DC

## 2017-02-21 NOTE — Progress Notes (Signed)
Subjective:  Patient ID: Maria Bentley, female    DOB: Apr 20, 1971  Age: 46 y.o. MRN: [id number]  CC: The primary encounter diagnosis was Diabetes mellitus without complication ([place]). Diagnoses of Morbid obesity ([place]), Hyperlipidemia associated with type 2 diabetes mellitus ([place]), Noncompliance with diabetes treatment, and Uncontrolled type 2 diabetes mellitus with diabetic nephropathy, unspecified long term insulin use status ([place]) were also pertinent to this visit.  HPI Maria Bentley presents for follow up on uncontrolled diabetes and morbid obesity despite prior gastric bypass in 2003  Has been lost to follow up since July.   Has been referred to endocrine but has not seen . Rescheduled and cancelled several times. Th specialist copay was too cost prohibitive .     Phenergan refill denied in January until she was seen.  States that she Uses the phenergan for daily migraine management but admits that she overeats and does not exercise. Marland Kitchen Has gained 10 lbs since last visit,  BMI now 45 . Discussed her Prior success in losing 25 lbs using t weight watchers last  April 2017, but "fell off the wagon" when her other died in Nov 27, 2023.   Did not allow hersel ample time to grieve,  Only took 2 days off from work . Was more concerned about making the holidays nice for her sister who lives in group home and came home for the holidays.  Started eating what she was baking and she overdid it.  Then she became severely depressed in January,  lexapro was added by her psychiatrist  And authorized her to use lorazepam prn.   Also Using oxycontin for headaches. .  Grinds teeth , makes migraine worse.  Migraines.  Getting botox from neurology. Received botox yesterday and trigger point injections with steroids which are helping to relieve the headaches.  Discussed whether she has had a trial of topomax . States that she has used it remotely,  And that neurology is considering a repeat trial in the  future  .   Uncontrolled diabetes:  Notes that her cbg was over 300 earlier today so she increased her insulin use from 35 units with breakfast by adding 10 additional units at lunchtime but did not eat.  cbg here in the office 84.  Patient given two snack sized KIND bars (6 g sugar, 15 g protein) to supplement her glucose tablet.   Her a1c has improved from 9.1 to 8.2 today .  She is using novolog 20 units tid q ac With sliding scale adjustments ,  And novolin  NPH  66 units at [facility].      Referral to bariatric surgery was recommended earlier   To Dr Darnell Level But she did not go because she thought she would have to prove 6 months of lifestyle compliance as she did the first tie,  Has since found out that there is an accelerated evaluation for prior gastric bypass patients, and  gastric sleeve is used   She has been Seeing orthopedics for bilateral sciatica right worse than left, sates that she is having episodes of foot drop  .  Lumbar  MRI ordered for tomorrow  . Having trouble walking at times due to severe pain.  Has oxycodone/apap prescribed   by Dr Zigmund Daniel for headaches,  But using it prn back pain .  Using lyrica 300 mg qhs  ,   Outpatient Medications Prior to Visit  Medication Sig Dispense Refill  . acetaminophen (TYLENOL) 325 MG tablet Take 650 mg by  mouth every 6 (six) hours as needed.    Marland Kitchen atorvastatin (LIPITOR) 20 MG tablet TAKE 1 TABLET (20 MG TOTAL) BY MOUTH DAILY. 90 tablet 3  . azelastine (ASTELIN) 0.1 % nasal spray Place 2 sprays into both nostrils 2 (two) times daily. Use in each nostril as directed    . B-D INS SYR MICROFINE 1CC/28G 28G X 1/2" 1 ML MISC USE 1 SYRINGE DAILY AFTER SUPPER 100 each 1  . B-D INS SYR MICROFINE 1CC/28G 28G X 1/2" 1 ML MISC USE 1 SYRINGE DAILY AFTER SUPPER 100 each 11  . BD INTEGRA SYRINGE 25G X 1" 3 ML MISC FOR USE WITH B12 INJECTIONS WEEKLY/MONTHLY 25 each 0  . blood glucose meter kit and supplies KIT Dispense based on patient and insurance preference.  Use up to four times daily as directed. (FOR ICD-E11.21) 1 each 0  . buPROPion (WELLBUTRIN XL) 300 MG 24 hr tablet Take 450 mg by mouth daily.     . fluticasone (FLONASE) 50 MCG/ACT nasal spray Place 2 sprays into both nostrils daily. 16 g 3  . glipiZIDE (GLUCOTROL) 5 MG tablet TAKE 1 TABLET BY MOUTH TWICE A DAY FOR BEFORE MEALS 180 tablet 1  . hydrochlorothiazide (MICROZIDE) 12.5 MG capsule TAKE ONE CAPSULE BY MOUTH ONE TIME DAILY 90 capsule 0  . Insulin Syringe-Needle U-100 (B-D INS SYR ULTRAFINE 1CC/31G) 31G X 5/16" 1 ML MISC 1 Syringe by Does not apply route daily. 100 each 0  . Lactulose 20 GM/30ML SOLN 30 ml every 4 hours until constipation is relieved 236 mL 3  . lamoTRIgine (LAMICTAL) 100 MG tablet Take 150 mg by mouth daily.     [name] Device MISC Test blood sugar two times daily 100 each 6  . LORazepam (ATIVAN) 0.5 MG tablet Take 0.5 mg by mouth daily. ! To 2 tablets per day as needed for anxiety.    . meclizine (ANTIVERT) 25 MG tablet Take 1 tablet (25 mg total) by mouth 3 (three) times daily as needed for dizziness. 90 tablet 6  . metFORMIN (GLUMETZA) 1000 MG (MOD) 24 hr tablet Take 2 tablets (2,000 mg total) by mouth daily with breakfast. 180 tablet 1  . methocarbamol (ROBAXIN) 500 MG tablet TAKE ONE TABLET BY MOUTH FOUR TIMES DAILY  (Patient taking differently: Take one or two tabs four times daily, as needed) 90 tablet 3  . NOVOFINE 32G X 6 MM MISC USE WITH NOVOLOG FLEXPEN AS DIRECTED 100 each 1  . NOVOLOG FLEXPEN 100 UNIT/ML FlexPen INJECT 20 UNITS INTO THE SKIN 3 (THREE) TIMES DAILY WITH MEALS. 90 mL 9  . nystatin (NYSTATIN) powder Apply topically 2 (two) times daily. 60 g 5  . OnabotulinumtoxinA (BOTOX IJ) Inject as directed every 3 (three) months. For migraine    . oxyCODONE-acetaminophen (PERCOCET) 10-325 MG tablet Take 1 tablet by mouth every 6 (six) hours as needed for pain.    . pregabalin (LYRICA) 50 MG capsule Take 300 mg by mouth at bedtime.     . promethazine  (PHENERGAN) 25 MG tablet TAKE 1 TABLET EVERY 8 HOURS AS NEEDED 20 tablet 0  . SUMAtriptan (IMITREX) 100 MG tablet TAKE 1 TABLET BY MOUTH EVERY DAY AS NEEDED MAY REPEAT IN 2HRS IF HEADACHE PERSISTS. MAX 2 TABS/24 HR 9 tablet 0  . traMADol (ULTRAM) 50 MG tablet Take 1 tablet (50 mg total) by mouth every 6 (six) hours as needed. 90 tablet 5  . VENTOLIN HFA 108 (90 BASE) MCG/ACT inhaler INHALE ONE PUFF BY  MOUTH EVERY SIX HOURS AS NEEDED FOR WHEEZING  18 g 3  . NOVOLIN N 100 UNIT/ML injection INJECT 55 UNITS INTO THE SKIN AT BEDTIME AS DIRECTED 20 mL 5  . ONETOUCH VERIO test strip TEST BLOOD SUGAR TWICE DAILY 100 each 5  . furosemide (LASIX) 20 MG tablet TAKE 1 TABLET (20 MG TOTAL) BY MOUTH DAILY. (Patient not taking: Reported on 02/21/2017) 30 tablet 3  . lisinopril (PRINIVIL,ZESTRIL) 5 MG tablet TAKE 1 TABLET (5 MG TOTAL) BY MOUTH DAILY. (Patient not taking: Reported on 02/21/2017) 90 tablet 2  . norethindrone (MICRONOR,CAMILA,ERRIN) 0.35 MG tablet Take 1 tablet by mouth daily.    . potassium chloride (MICRO-K) 10 MEQ CR capsule TAKE TWO CAPSULES BY MOUTH DAILY (Patient not taking: Reported on 06/30/2016) 90 capsule 2  . Prenatal Vit-Fe Fumarate-FA (PRENATAL MULTIVITAMIN) TABS Take 1 tablet by mouth daily.    . saxagliptin HCl (ONGLYZA) 5 MG TABS tablet Take 1 tablet (5 mg total) by mouth daily. (Patient not taking: Reported on 06/30/2016) 30 tablet 5  . Vitamin D, Ergocalciferol, (DRISDOL) 50000 units CAPS capsule TAKE 1 CAPSULE BY MOUTH ONCE A WEEK. (Patient not taking: Reported on 02/21/2017) 12 capsule 0   No facility-administered medications prior to visit.     Review of Systems;  Patient deniese, fevers, malaise, unintentional weight loss, skin rash, eye pain, sinus congestion and sinus pain, sore throat, dysphagia,  hemoptysis , cough, dyspnea, wheezing, chest pain, palpitations, orthopnea, edema, abdominal pain, nausea, melena, diarrhea, constipation, flank pain, dysuria, hematuria, urinary   Frequency, nocturia, numbness, tingling, seizures, , Loss of consciousness,  Tremor, and suicidal ideation.      Objective:  BP 112/82 (BP Location: Left Arm, Patient Position: Sitting, Cuff Size: Large)   Pulse 99   Resp 17   Ht 5' 4"  (1.626 m)   Wt 264 lb (119.7 kg)   SpO2 98%   BMI 45.32 kg/m   BP Readings from Last 3 Encounters:  02/21/17 112/82  06/30/16 (!) 122/92  05/04/16 (!) 129/92    Wt Readings from Last 3 Encounters:  02/21/17 264 lb (119.7 kg)  06/30/16 254 lb (115.2 kg)  01/13/16 268 lb 3.2 oz (121.7 kg)    General appearance: alert, cooperative and appears stated age  Neck: no adenopathy, no carotid bruit, supple, symmetrical, trachea midline and thyroid not enlarged, symmetric, no tenderness/mass/nodules Back: symmetric, no curvature. ROM normal. No CVA tenderness. Lungs: clear to auscultation bilaterally Heart: regular rate and rhythm, S1, S2 normal, no murmur, click, rub or gallop Abdomen: soft, non-tender; bowel sounds normal; no masses,  no organomegaly Pulses: 2+ and symmetric Skin: Skin color, texture, turgor normal. No rashes or lesions Lymph nodes: Cervical, supraclavicular, and axillary nodes normal. Neuro: CNs 2-12 intact. DTRs 2+/4 in biceps, brachioradialis, patellars and achilles. Muscle strength 5/5 in upper and lower exremities. Fine resting tremor bilaterally both hands cerebellar function normal. Romberg negative.  No pronator drift.   Gait normal.   Lab Results  Component Value Date   HGBA1C 8.2 02/21/2017   HGBA1C 9.1 (H) 06/27/2016   HGBA1C 8.1 (H) 12/27/2015    Lab Results  Component Value Date   CREATININE 1.18 02/21/2017   CREATININE 1.17 06/27/2016   CREATININE 1.08 12/27/2015    Lab Results  Component Value Date   WBC 8.3 08/25/2015   HGB 11.8 (L) 08/25/2015   HCT 37.7 08/25/2015   PLT 527.0 (H) 08/25/2015   GLUCOSE 33 (LL) 02/21/2017   CHOL 167 02/21/2017   TRIG 122.0  02/21/2017   HDL 55.60 02/21/2017   LDLDIRECT  92.0 02/21/2017   LDLCALC 87 02/21/2017   ALT 14 02/21/2017   AST 11 02/21/2017   NA 141 02/21/2017   K 4.3 02/21/2017   CL 100 02/21/2017   CREATININE 1.18 02/21/2017   BUN 25 (H) 02/21/2017   CO2 32 02/21/2017   TSH 1.91 05/27/2014   HGBA1C 8.2 02/21/2017   MICROALBUR 1.3 02/21/2017    Dg Foot Complete Right  Result Date: 12/28/2015 CLINICAL DATA:  Right foot pain, no injury. EXAM: RIGHT FOOT COMPLETE - 3+ VIEW COMPARISON:  None. FINDINGS: There is no evidence of fracture or dislocation. There is a small plantar calcaneal spur. Soft tissues are unremarkable. IMPRESSION: No acute fracture or dislocation. Electronically Signed   By: Abelardo Diesel M.D.   On: 12/28/2015 16:25   Dg Hip Unilat With Pelvis 2-3 Views Left  Result Date: 12/28/2015 CLINICAL DATA:  Patient with hip pain. No fall. No injury. Initial encounter. EXAM: DG HIP (WITH OR WITHOUT PELVIS) 2-3V LEFT COMPARISON:  None. FINDINGS: Normal anatomic alignment. No evidence for acute fracture or dislocation. Mild degenerative changes bilateral hip joints. SI joints are unremarkable. Pelvic ring intact. IMPRESSION: No acute osseous abnormality. Electronically Signed   By: Lovey Newcomer M.D.   On: 12/28/2015 16:25    Assessment & Plan:   Problem List Items Addressed This Visit    Diabetes mellitus type 2, uncontrolled ([place])    Blood sugars are currently labile due to steroid injections received yesterday.  Hypoglycemia occurring at least once a week due to patient's erratic eating habits.  Reminded NEVER to skip meals and options given. Adding  victoza today ,  Will gradually increase the dose if tolerated to the weight management dose of 3.0 g daily.  Lab Results  Component Value Date   HGBA1C 8.2 02/21/2017         Relevant Medications   insulin NPH Human (NOVOLIN N) 100 UNIT/ML injection   liraglutide 18 MG/3ML SOPN   Morbid obesity ([place])    s/p Rou en y in 2003 .  Has been an Psychiatrist since childhood.  Requesting  referral to dr Darnell Level to consider gastric sleeve. Adding saxenda today       Relevant Medications   insulin NPH Human (NOVOLIN N) 100 UNIT/ML injection   liraglutide 18 MG/3ML SOPN   Other Relevant Orders   Amb Referral to Bariatric Surgery   LDL cholesterol, direct (Completed)   Noncompliance with diabetes treatment    She reports that she is using novolog tid with sliding scale,  And has increased her NPH to 66 units.  Adding saxenda today fr weight management and additional control..  She has deferred endocrinology appointment due to the copay       Other Visit Diagnoses    Diabetes mellitus without complication ([place])    -  Primary   Relevant Medications   insulin NPH Human (NOVOLIN N) 100 UNIT/ML injection   liraglutide 18 MG/3ML SOPN   Insulin Pen Needle (PEN NEEDLES) 31G X 6 MM MISC   Other Relevant Orders   POCT HgB A1C (Completed)   Comprehensive metabolic panel (Completed)   Microalbumin / creatinine urine ratio (Completed)   Lipid panel (Completed)   Hyperlipidemia associated with type 2 diabetes mellitus ([facility])       Relevant Medications   EPINEPHrine 0.3 mg/0.3 mL IJ SOAJ injection   insulin NPH Human (NOVOLIN N) 100 UNIT/ML injection   liraglutide 18 MG/3ML SOPN  I have discontinued Maria Bentley's prenatal multivitamin, potassium chloride, norethindrone, saxagliptin HCl, Vitamin D (Ergocalciferol), lisinopril, furosemide, and ONETOUCH VERIO. I have also changed her NOVOLIN N to insulin NPH Human. Additionally, I am having her start on Pen Needles. Lastly, I am having her maintain her acetaminophen, Lancet Device, methocarbamol, Insulin Syringe-Needle U-100, pregabalin, OnabotulinumtoxinA (BOTOX IJ), VENTOLIN HFA, azelastine, lamoTRIgine, LORazepam, meclizine, buPROPion, oxyCODONE-acetaminophen, Lactulose, B-D INS SYR MICROFINE 1CC/28G, B-D INS SYR MICROFINE 1CC/28G, fluticasone, NOVOLOG FLEXPEN, traMADol, nystatin, blood glucose meter kit and supplies, atorvastatin,  SUMAtriptan, NOVOFINE, BD INTEGRA SYRINGE, glipiZIDE, hydrochlorothiazide, metFORMIN, promethazine, EPINEPHrine, liraglutide, and glucose blood.  Meds ordered this encounter  Medications  . EPINEPHrine 0.3 mg/0.3 mL IJ SOAJ injection  . insulin NPH Human (NOVOLIN N) 100 UNIT/ML injection    Sig: Inject 60 to 70 units subq at bedtime    Dispense:  30 mL    Refill:  5  . DISCONTD: liraglutide 18 MG/3ML SOPN    Sig: Inject 0.1 mLs (0.6 mg total) into the skin daily. Increase to 1.2 mg after one week    Dispense:  6 mL    Refill:  5  . liraglutide 18 MG/3ML SOPN    Sig: Inject 0.1 mLs (0.6 mg total) into the skin daily. Increase to 1.2 mg after one week    Dispense:  6 mL    Refill:  5  . Insulin Pen Needle (PEN NEEDLES) 31G X 6 MM MISC    Sig: For use with victoza /saxenda    Dispense:  100 each    Refill:  0  . DISCONTD: glucose blood test strip    Sig: Test blood sugar 5 times daily    Dispense:  150 each    Refill:  5  . glucose blood test strip    Sig: Test blood sugar 5 times daily    Dispense:  150 each    Refill:  5    Medications Discontinued During This Encounter  Medication Reason  . furosemide (LASIX) 20 MG tablet Patient has not taken in last 30 days  . lisinopril (PRINIVIL,ZESTRIL) 5 MG tablet Patient has not taken in last 30 days  . norethindrone (MICRONOR,CAMILA,ERRIN) 0.35 MG tablet Patient has not taken in last 30 days  . potassium chloride (MICRO-K) 10 MEQ CR capsule Patient has not taken in last 30 days  . Prenatal Vit-Fe Fumarate-FA (PRENATAL MULTIVITAMIN) TABS Patient has not taken in last 30 days  . saxagliptin HCl (ONGLYZA) 5 MG TABS tablet Patient has not taken in last 30 days  . Vitamin D, Ergocalciferol, (DRISDOL) 50000 units CAPS capsule Patient has not taken in last 30 days  . NOVOLIN N 100 UNIT/ML injection Reorder  . liraglutide 18 MG/3ML SOPN   . ONETOUCH VERIO test strip   . glucose blood test strip Reorder    Follow-up: Return in about 4  weeks (around 03/21/2017) for follow up diabetes.   [name], MD

## 2017-02-21 NOTE — Progress Notes (Signed)
Pre visit review using our clinic review tool, if applicable. No additional management support is needed unless otherwise documented below in the visit note. 

## 2017-02-21 NOTE — Patient Instructions (Addendum)
I have refilled your Novolon N insulin at the higher dose.   I am adding Victoza as a once daily injection to help your appetite andd lower your blood sugars  If you tolerate the 1.2 mg dose we can increase the dose gradually to 3.0  Mg daily over the 2nd month

## 2017-02-22 ENCOUNTER — Telehealth: Payer: Self-pay

## 2017-02-22 NOTE — Assessment & Plan Note (Signed)
She reports that she is using novolog tid with sliding scale,  And has increased her NPH to 66 units.  Adding saxenda today fr weight management and additional control..  She has deferred endocrinology appointment due to the copay

## 2017-02-22 NOTE — Assessment & Plan Note (Addendum)
Blood sugars are currently labile due to steroid injections received yesterday.  Hypoglycemia occurring at least once a week due to patient's erratic eating habits.  Reminded NEVER to skip meals and options given. Adding  victoza today ,  Will gradually increase the dose if tolerated to the weight management dose of 3.0 g daily.  Lab Results  Component Value Date   HGBA1C 8.2 02/21/2017

## 2017-02-22 NOTE — Assessment & Plan Note (Signed)
s/p Rou en y in 2003 .  Has been an Psychiatrist since childhood.  Requesting referral to dr Darnell Level to consider gastric sleeve. Adding saxenda today

## 2017-02-22 NOTE — Telephone Encounter (Signed)
Received a team health report that patient had labs drawn yesterday and her glucose reported to On call physician as a critical value per the note the RN attempted to reach the patient 3 times with no response.  Called patient to check on her.  She stated that she knew her Blood glucose was low yesterday at the visit and talked with the PCP about it.  Was given a KIND bar to eat and then when she got to her car she recognized that it was still low to her and she took 2 glucose tablets.  She proceeded to go to eat at [employer] afterwards.  She has felt fine since then.  I asked if she received any calls last night regarding this and she declined and said she had no messages either.  Notified PCP that communication was completed today and that patient was feeling much better.

## 2017-02-25 ENCOUNTER — Encounter: Payer: Self-pay | Admitting: Internal Medicine

## 2017-03-12 ENCOUNTER — Telehealth: Payer: Self-pay

## 2017-03-12 DIAGNOSIS — N83202 Unspecified ovarian cyst, left side: Secondary | ICD-10-CM

## 2017-03-12 NOTE — Telephone Encounter (Signed)
Dr [name] received MRI L spine from [facility] , showed 2X 3 cm left pelvic cyst which showed on left side .  Ok to schedule plevic ultrasound.  Left message to call .

## 2017-03-19 ENCOUNTER — Other Ambulatory Visit: Payer: Self-pay | Admitting: Internal Medicine

## 2017-03-21 NOTE — Telephone Encounter (Signed)
Left voice mail to call back 

## 2017-03-29 NOTE — Telephone Encounter (Signed)
Spoke with patient states work and it has been crazy and she hasn't been that's why she hasn't called back.  She is agreeable to pelvic ultrasound , she states she has history of cyst on fallopian tubes .

## 2017-03-29 NOTE — Telephone Encounter (Signed)
ORDERED

## 2017-03-29 NOTE — Addendum Note (Signed)
Addended by: [name] on: 03/29/2017 12:09 PM   Modules accepted: Orders

## 2017-04-03 ENCOUNTER — Encounter: Payer: Self-pay | Admitting: Internal Medicine

## 2017-04-03 ENCOUNTER — Telehealth: Payer: Self-pay

## 2017-04-03 ENCOUNTER — Ambulatory Visit (INDEPENDENT_AMBULATORY_CARE_PROVIDER_SITE_OTHER): Payer: BC Managed Care – PPO | Admitting: Internal Medicine

## 2017-04-03 ENCOUNTER — Other Ambulatory Visit: Payer: Self-pay | Admitting: Internal Medicine

## 2017-04-03 VITALS — BP 110/72 | HR 116 | Temp 98.1°F | Resp 16 | Ht 64.0 in | Wt 259.2 lb

## 2017-04-03 DIAGNOSIS — R1900 Intra-abdominal and pelvic swelling, mass and lump, unspecified site: Secondary | ICD-10-CM

## 2017-04-03 DIAGNOSIS — R000 Tachycardia, unspecified: Secondary | ICD-10-CM | POA: Diagnosis not present

## 2017-04-03 DIAGNOSIS — E559 Vitamin D deficiency, unspecified: Secondary | ICD-10-CM

## 2017-04-03 DIAGNOSIS — E538 Deficiency of other specified B group vitamins: Secondary | ICD-10-CM

## 2017-04-03 DIAGNOSIS — E1121 Type 2 diabetes mellitus with diabetic nephropathy: Secondary | ICD-10-CM | POA: Diagnosis not present

## 2017-04-03 DIAGNOSIS — E1165 Type 2 diabetes mellitus with hyperglycemia: Secondary | ICD-10-CM | POA: Diagnosis not present

## 2017-04-03 DIAGNOSIS — D508 Other iron deficiency anemias: Secondary | ICD-10-CM

## 2017-04-03 MED ORDER — TRAMADOL HCL 50 MG PO TABS: 50.0000 mg | ORAL_TABLET | Freq: Four times a day (QID) | ORAL | 5 refills | Status: DC | PRN

## 2017-04-03 MED ORDER — PROMETHAZINE HCL 25 MG PO TABS: 25.0000 mg | ORAL_TABLET | Freq: Three times a day (TID) | ORAL | 5 refills | Status: DC | PRN

## 2017-04-03 NOTE — Patient Instructions (Addendum)
Your novolog insulin is dropping your blood sugars too low.    Reduce baseline novolog to 15 units,  Lower the additional dose to 3 units  every 50 over 150    You might want to try a premixed protein drink called Premier Protein shake for breakfast or late night snack . It is great tasting,   very low sugar and available of < $2 serving at [facility] and  In bulk for $1.50/serving at [employer] and [employer]  .    Nutritional analysis :  160 cal  30 g protein  1 g sugar 50% calcium needs   Vladimir Faster and [employer]

## 2017-04-03 NOTE — Progress Notes (Signed)
Pre visit review using our clinic review tool, if applicable. No additional management support is needed unless otherwise documented below in the visit note. 

## 2017-04-03 NOTE — Telephone Encounter (Signed)
-----   Message from Eustace Pen sent at 04/02/2017  2:16 PM EDT ----- Regarding: ultrasound Graylon Good, Can you enter another order for this pt please? She is scheduled on 4/27 but will need the non ob ultrasound entered. It is [id number].  Thank you! Melissa

## 2017-04-03 NOTE — Progress Notes (Signed)
Subjective:  Patient ID: Maria Bentley, female    DOB: 10/05/1971  Age: 46 y.o. MRN: [id number]  CC: Diagnoses of Morbid obesity ([place]), Tachycardia, Uncontrolled type 2 diabetes mellitus with diabetic nephropathy, unspecified whether long term insulin use ([place]), and Pelvic mass in female were pertinent to this visit.  HPI Maria Bentley presents for follow up on uncontrolled type 2 DM.  Last seen in March and Victoza added to novolog sliding scale.  NPH dose was  increased to 66 units in the evening.   Tolerating victoza but could advance dose beyond 0.6 mg due to persistent nausea. bs more stable , but she has had several lows,  Including today .  She is doing less snacking at night ,  Has lost 5 lbs in the past month trial at the 0.6 mg dose.  Low BS   This afternoon,  Repeat still low . occurrs frequently,  Adjusted novolog  5 per 50 over 150   Baseline 20 units    She attributes the weight loss in part to the death of her mother,  Both due to grief and due to the absence of a fellow overeater who was enabling her. She has  [facility] and has attended the seminar.  She will need to undergo multiple evaluations in preparation for a redo of her surgery,  Including  psychologiogical,  egd, to be a candidate for redo.   Discussed qsymia,  Since she has a history of migraines.  She has taken topiramate before but had cognitive deficits at theerapeutic doses.  Her aunt had cardiomyopathy with phentermine  Dietary deficiencies:  She is due for iron and b12 with the next a1c.     Pelvic mass : noted on lumbar MRI: Has an appt for the ultrasound on Thursday of fallopian tube .  Dr Marcelline Mates is her established  choice of gyn .   Having a migraine , WANTS TRAMADOL  Outpatient Medications Prior to Visit  Medication Sig Dispense Refill  . acetaminophen (TYLENOL) 325 MG tablet Take 650 mg by mouth every 6 (six) hours as needed.    Marland Kitchen atorvastatin (LIPITOR) 20 MG  tablet TAKE 1 TABLET (20 MG TOTAL) BY MOUTH DAILY. 90 tablet 3  . azelastine (ASTELIN) 0.1 % nasal spray Place 2 sprays into both nostrils 2 (two) times daily. Use in each nostril as directed    . B-D INS SYR MICROFINE 1CC/28G 28G X 1/2" 1 ML MISC USE 1 SYRINGE DAILY AFTER SUPPER 100 each 1  . B-D INS SYR MICROFINE 1CC/28G 28G X 1/2" 1 ML MISC USE 1 SYRINGE DAILY AFTER SUPPER 100 each 11  . BD INTEGRA SYRINGE 25G X 1" 3 ML MISC FOR USE WITH B12 INJECTIONS WEEKLY/MONTHLY 25 each 0  . blood glucose meter kit and supplies KIT Dispense based on patient and insurance preference. Use up to four times daily as directed. (FOR ICD-E11.21) 1 each 0  . buPROPion (WELLBUTRIN XL) 300 MG 24 hr tablet Take 450 mg by mouth daily.     Marland Kitchen EPINEPHrine 0.3 mg/0.3 mL IJ SOAJ injection     . fluticasone (FLONASE) 50 MCG/ACT nasal spray Place 2 sprays into both nostrils daily. 16 g 3  . glipiZIDE (GLUCOTROL) 5 MG tablet TAKE 1 TABLET BY MOUTH TWICE A DAY FOR BEFORE MEALS 180 tablet 1  . glucose blood test strip Test blood sugar 5 times daily 150 each 5  . hydrochlorothiazide (MICROZIDE) 12.5 MG capsule TAKE  ONE CAPSULE BY MOUTH ONE TIME DAILY 90 capsule 0  . insulin NPH Human (NOVOLIN N) 100 UNIT/ML injection Inject 60 to 70 units subq at bedtime 30 mL 5  . Insulin Pen Needle (PEN NEEDLES) 31G X 6 MM MISC For use with victoza /saxenda 100 each 0  . Insulin Syringe-Needle U-100 (B-D INS SYR ULTRAFINE 1CC/31G) 31G X 5/16" 1 ML MISC 1 Syringe by Does not apply route daily. 100 each 0  . Lactulose 20 GM/30ML SOLN 30 ml every 4 hours until constipation is relieved 236 mL 3  . lamoTRIgine (LAMICTAL) 100 MG tablet Take 150 mg by mouth daily.     Demetra Shiner Device MISC Test blood sugar two times daily 100 each 6  . liraglutide 18 MG/3ML SOPN Inject 0.1 mLs (0.6 mg total) into the skin daily. Increase to 1.2 mg after one week 6 mL 5  . LORazepam (ATIVAN) 0.5 MG tablet Take 0.5 mg by mouth daily. ! To 2 tablets per day as needed for  anxiety.    . meclizine (ANTIVERT) 25 MG tablet Take 1 tablet (25 mg total) by mouth 3 (three) times daily as needed for dizziness. 90 tablet 6  . metFORMIN (GLUMETZA) 1000 MG (MOD) 24 hr tablet Take 2 tablets (2,000 mg total) by mouth daily with breakfast. 180 tablet 1  . methocarbamol (ROBAXIN) 500 MG tablet TAKE ONE TABLET BY MOUTH FOUR TIMES DAILY  (Patient taking differently: Take one or two tabs four times daily, as needed) 90 tablet 3  . NOVOFINE 32G X 6 MM MISC USE WITH NOVOLOG FLEXPEN AS DIRECTED 100 each 1  . NOVOLOG FLEXPEN 100 UNIT/ML FlexPen INJECT 20 UNITS INTO THE SKIN 3 (THREE) TIMES DAILY WITH MEALS. 90 mL 9  . nystatin (NYSTATIN) powder Apply topically 2 (two) times daily. 60 g 5  . OnabotulinumtoxinA (BOTOX IJ) Inject as directed every 3 (three) months. For migraine    . oxyCODONE-acetaminophen (PERCOCET) 10-325 MG tablet Take 1 tablet by mouth every 6 (six) hours as needed for pain.    . pregabalin (LYRICA) 50 MG capsule Take 300 mg by mouth at bedtime.     . SUMAtriptan (IMITREX) 100 MG tablet TAKE 1 TABLET BY MOUTH EVERY DAY AS NEEDED MAY REPEAT IN 2HRS IF HEADACHE PERSISTS. MAX 2 TABS/24 HR 9 tablet 0  . VENTOLIN HFA 108 (90 BASE) MCG/ACT inhaler INHALE ONE PUFF BY MOUTH EVERY SIX HOURS AS NEEDED FOR WHEEZING  18 g 3  . promethazine (PHENERGAN) 25 MG tablet TAKE 1 TABLET EVERY 8 HOURS AS NEEDED 20 tablet 0  . traMADol (ULTRAM) 50 MG tablet Take 1 tablet (50 mg total) by mouth every 6 (six) hours as needed. 90 tablet 5   No facility-administered medications prior to visit.     Review of Systems;  Patient denies headache, fevers, malaise, unintentional weight loss, skin rash, eye pain, sinus congestion and sinus pain, sore throat, dysphagia,  hemoptysis , cough, dyspnea, wheezing, chest pain, palpitations, orthopnea, edema, abdominal pain, nausea, melena, diarrhea, constipation, flank pain, dysuria, hematuria, urinary  Frequency, nocturia, numbness, tingling, seizures,  Focal  weakness, Loss of consciousness,  Tremor, insomnia, depression, anxiety, and suicidal ideation.      Objective:  BP 110/72   Pulse (!) 116   Temp 98.1 F (36.7 C) (Oral)   Resp 16   Ht 5\' 4"  (1.626 m)   Wt 259 lb 3.2 oz (117.6 kg)   SpO2 98%   BMI 44.49 kg/m   BP Readings from  Last 3 Encounters:  04/03/17 110/72  02/21/17 112/82  06/30/16 (!) 122/92    Wt Readings from Last 3 Encounters:  04/03/17 259 lb 3.2 oz (117.6 kg)  02/21/17 264 lb (119.7 kg)  06/30/16 254 lb (115.2 kg)    General appearance: alert, cooperative and appears stated age Ears: normal TM's and external ear canals both ears Throat: lips, mucosa, and tongue normal; teeth and gums normal Neck: no adenopathy, no carotid bruit, supple, symmetrical, trachea midline and thyroid not enlarged, symmetric, no tenderness/mass/nodules Back: symmetric, no curvature. ROM normal. No CVA tenderness. Lungs: clear to auscultation bilaterally Heart: regular rate and rhythm, S1, S2 normal, no murmur, click, rub or gallop Abdomen: soft, non-tender; bowel sounds normal; no masses,  no organomegaly Pulses: 2+ and symmetric Skin: Skin color, texture, turgor normal. No rashes or lesions Lymph nodes: Cervical, supraclavicular, and axillary nodes normal.  Lab Results  Component Value Date   HGBA1C 8.2 02/21/2017   HGBA1C 9.1 (H) 06/27/2016   HGBA1C 8.1 (H) 12/27/2015    Lab Results  Component Value Date   CREATININE 1.18 02/21/2017   CREATININE 1.17 06/27/2016   CREATININE 1.08 12/27/2015    Lab Results  Component Value Date   WBC 8.3 08/25/2015   HGB 11.8 (L) 08/25/2015   HCT 37.7 08/25/2015   PLT 527.0 (H) 08/25/2015   GLUCOSE 33 (LL) 02/21/2017   CHOL 167 02/21/2017   TRIG 122.0 02/21/2017   HDL 55.60 02/21/2017   LDLDIRECT 92.0 02/21/2017   LDLCALC 87 02/21/2017   ALT 14 02/21/2017   AST 11 02/21/2017   NA 141 02/21/2017   K 4.3 02/21/2017   CL 100 02/21/2017   CREATININE 1.18 02/21/2017   BUN 25 (H)  02/21/2017   CO2 32 02/21/2017   TSH 1.91 05/27/2014   HGBA1C 8.2 02/21/2017   MICROALBUR 1.3 02/21/2017    Dg Foot Complete Right  Result Date: 12/28/2015 CLINICAL DATA:  Right foot pain, no injury. EXAM: RIGHT FOOT COMPLETE - 3+ VIEW COMPARISON:  None. FINDINGS: There is no evidence of fracture or dislocation. There is a small plantar calcaneal spur. Soft tissues are unremarkable. IMPRESSION: No acute fracture or dislocation. Electronically Signed   By: Abelardo Diesel M.D.   On: 12/28/2015 16:25   Dg Hip Unilat With Pelvis 2-3 Views Left  Result Date: 12/28/2015 CLINICAL DATA:  Patient with hip pain. No fall. No injury. Initial encounter. EXAM: DG HIP (WITH OR WITHOUT PELVIS) 2-3V LEFT COMPARISON:  None. FINDINGS: Normal anatomic alignment. No evidence for acute fracture or dislocation. Mild degenerative changes bilateral hip joints. SI joints are unremarkable. Pelvic ring intact. IMPRESSION: No acute osseous abnormality. Electronically Signed   By: Lovey Newcomer M.D.   On: 12/28/2015 16:25    Assessment & Plan:   Problem List Items Addressed This Visit    Diabetes mellitus type 2, uncontrolled ([place])    Continue victoza at 0.6 mg for now.  Reduce novolog sliding scale as follows to prevent recurrent hypoglycemia:  Reduce baseline novolog dose  to 15 units,  Lower the additional dose to 3 units for  every 50 points over 150       Morbid obesity ([place])    Secondary to overeating, despite prior gastric bypass surgery . She has lost 5 lbs since starting victoza but has nausea even at the lowest dose.  Bariatric follow up in progress      Pelvic mass in female    Ultrasound ordered to follow up on mass seen on  MRI lumbar spine       Tachycardia    Chronic , currently due to hypoglycemia and migraine.          I have changed Ms. Backlund's promethazine. I am also having her maintain her acetaminophen, Lancet Device, methocarbamol, Insulin Syringe-Needle U-100, pregabalin,  OnabotulinumtoxinA (BOTOX IJ), VENTOLIN HFA, azelastine, lamoTRIgine, LORazepam, meclizine, buPROPion, oxyCODONE-acetaminophen, Lactulose, B-D INS SYR MICROFINE 1CC/28G, B-D INS SYR MICROFINE 1CC/28G, fluticasone, NOVOLOG FLEXPEN, nystatin, blood glucose meter kit and supplies, atorvastatin, NOVOFINE, BD INTEGRA SYRINGE, glipiZIDE, hydrochlorothiazide, metFORMIN, EPINEPHrine, insulin NPH Human, liraglutide, Pen Needles, glucose blood, SUMAtriptan, and traMADol.  Meds ordered this encounter  Medications  . traMADol (ULTRAM) 50 MG tablet    Sig: Take 1 tablet (50 mg total) by mouth every 6 (six) hours as needed.    Dispense:  90 tablet    Refill:  5  . promethazine (PHENERGAN) 25 MG tablet    Sig: Take 1 tablet (25 mg total) by mouth every 8 (eight) hours as needed.    Dispense:  15 tablet    Refill:  5    Medications Discontinued During This Encounter  Medication Reason  . traMADol (ULTRAM) 50 MG tablet Reorder  . promethazine (PHENERGAN) 25 MG tablet Reorder   A total of 40 minutes was spent with patient more than half of which was spent in counseling patient on the above mentioned issues , reviewing and explaining recent labs and imaging studies done, and coordination of care.   Follow-up: Return for fasting labs after Jun 14 ,  OV to follow .   [name], MD

## 2017-04-05 DIAGNOSIS — R1900 Intra-abdominal and pelvic swelling, mass and lump, unspecified site: Secondary | ICD-10-CM | POA: Insufficient documentation

## 2017-04-05 NOTE — Assessment & Plan Note (Signed)
Secondary to overeating, despite prior gastric bypass surgery . She has lost 5 lbs since starting victoza but has nausea even at the lowest dose.  Bariatric follow up in progress

## 2017-04-05 NOTE — Assessment & Plan Note (Signed)
Ultrasound ordered to follow up on mass seen on MRI lumbar spine

## 2017-04-05 NOTE — Assessment & Plan Note (Signed)
Chronic , currently due to hypoglycemia and migraine.

## 2017-04-05 NOTE — Assessment & Plan Note (Signed)
Continue victoza at 0.6 mg for now.  Reduce novolog sliding scale as follows to prevent recurrent hypoglycemia:  Reduce baseline novolog dose  to 15 units,  Lower the additional dose to 3 units for  every 50 points over 150

## 2017-04-06 ENCOUNTER — Ambulatory Visit
Admission: RE | Admit: 2017-04-06 | Discharge: 2017-04-06 | Disposition: A | Discharge status: Home / Self Care | Payer: BC Managed Care – PPO | Source: Ambulatory Visit | Attending: Internal Medicine | Admitting: Internal Medicine

## 2017-04-06 DIAGNOSIS — R1900 Intra-abdominal and pelvic swelling, mass and lump, unspecified site: Secondary | ICD-10-CM | POA: Diagnosis present

## 2017-04-06 DIAGNOSIS — N83202 Unspecified ovarian cyst, left side: Secondary | ICD-10-CM

## 2017-04-08 ENCOUNTER — Encounter: Payer: Self-pay | Admitting: Internal Medicine

## 2017-04-10 ENCOUNTER — Other Ambulatory Visit: Payer: Self-pay | Admitting: Internal Medicine

## 2017-05-02 DIAGNOSIS — M5481 Occipital neuralgia: Secondary | ICD-10-CM | POA: Insufficient documentation

## 2017-05-10 ENCOUNTER — Other Ambulatory Visit: Payer: Self-pay | Admitting: Internal Medicine

## 2017-05-15 DIAGNOSIS — E1122 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 disease: Secondary | ICD-10-CM | POA: Insufficient documentation

## 2017-05-27 ENCOUNTER — Other Ambulatory Visit: Payer: Self-pay | Admitting: Internal Medicine

## 2017-05-30 ENCOUNTER — Encounter: Payer: Self-pay | Admitting: Obstetrics and Gynecology

## 2017-06-07 ENCOUNTER — Other Ambulatory Visit: Payer: Self-pay | Admitting: Internal Medicine

## 2017-06-19 ENCOUNTER — Encounter: Payer: Self-pay | Admitting: Obstetrics and Gynecology

## 2017-06-19 ENCOUNTER — Ambulatory Visit (INDEPENDENT_AMBULATORY_CARE_PROVIDER_SITE_OTHER): Payer: BC Managed Care – PPO | Admitting: Obstetrics and Gynecology

## 2017-06-19 VITALS — BP 155/64 | HR 110 | Ht 64.0 in | Wt 258.5 lb

## 2017-06-19 DIAGNOSIS — N83202 Unspecified ovarian cyst, left side: Secondary | ICD-10-CM | POA: Diagnosis not present

## 2017-06-19 DIAGNOSIS — Z1239 Encounter for other screening for malignant neoplasm of breast: Secondary | ICD-10-CM

## 2017-06-19 DIAGNOSIS — Z124 Encounter for screening for malignant neoplasm of cervix: Secondary | ICD-10-CM | POA: Diagnosis not present

## 2017-06-19 DIAGNOSIS — Z6841 Body Mass Index (BMI) 40.0 and over, adult: Secondary | ICD-10-CM | POA: Diagnosis not present

## 2017-06-19 DIAGNOSIS — N9089 Other specified noninflammatory disorders of vulva and perineum: Secondary | ICD-10-CM

## 2017-06-19 DIAGNOSIS — Z01419 Encounter for gynecological examination (general) (routine) without abnormal findings: Secondary | ICD-10-CM | POA: Diagnosis not present

## 2017-06-19 DIAGNOSIS — Z1231 Encounter for screening mammogram for malignant neoplasm of breast: Secondary | ICD-10-CM

## 2017-06-19 NOTE — Progress Notes (Signed)
GYNECOLOGY ANNUAL PHYSICAL EXAM PROGRESS NOTE  Subjective:    Maria Bentley is a 46 y.o. G0P0 female who presents for an annual exam.  She does think that she may be menopausal as it may have been a year or longer since her last menstrual cycle, although she has a history of irregular cycles. She is sexually active (although notes that it is not often). The patient wears seatbelts: yes. The patient participates in regular exercise: no. Has the patient ever been transfused or tattooed?: no. She reports that there is not domestic violence in her life.    The patient has the following complaints/concerns today:  1. Patient states that an incidental left ovarian cyst was noted on an MRI a few months ago.  States that her PCP ordered an ultrasound to follow up on the cyst several weeks ago, and recommended that she follow up with a GYN. Patient denies any pain or discomfort in pelvic area.  She does note a h/o a large left ovarian cyst in the past (caused by her endometriosis), that had to be removed.    Gynecologic History  Menarche age: 41 Patient's last menstrual period was 12/12/2015. Contraception: post menopausal status History of STI's: Denies Last Pap: 03/2013. Results were: normal.  Denies h/o abnormal pap smears. Last mammogram: 04/2014. Results were: normal   Obstetric History   G0   P0   T0   P0   A0   L0    SAB0   TAB0   Ectopic0   Multiple0   Live Births0       Past Medical History:  Diagnosis Date  . Anemia   . Anxiety   . Depression 2010  . Diabetes mellitus   . Endometriosis   . Headache, common migraine, intractable   . History of ovarian cyst    left sided, caused by her endometriosis (possibly an endometrioma), in her 64s.  Marland Kitchen Hyperlipidemia   . Hypertension   . Kidney stones    left  . Morbid obesity ([place])    s/p gastric bypass  . Personal hx of gastric bypass 2010  . Rosacea   . Vertigo     Past Surgical History:  Procedure Laterality Date    . DILATION AND CURETTAGE OF UTERUS  2011  . gastric bypass    . OVARIAN CYST REMOVAL    . ROTATOR CUFF REPAIR     right    Family History  Problem Relation Age of Onset  . Diabetes Mother   . Hypertension Mother   . Hypertension Father   . Diabetes Father   . Depression Other        strong hx of  . Cancer Neg Hx     Social History   Social History  . Marital status: Married    Spouse name: N/A  . Number of children: N/A  . Years of education: N/A   Occupational History  . Not on file.   Social History Main Topics  . Smoking status: Former Smoker    Packs/day: 1.00    Years: 4.00    Types: Cigarettes    Quit date: 02/05/1996  . Smokeless tobacco: Never Used  . Alcohol use No  . Drug use: No  . Sexual activity: Yes    Birth control/ protection: None   Other Topics Concern  . Not on file   Social History Narrative  . No narrative on file    Current Outpatient Prescriptions on File Prior  to Visit  Medication Sig Dispense Refill  . atorvastatin (LIPITOR) 20 MG tablet TAKE 1 TABLET (20 MG TOTAL) BY MOUTH DAILY. 90 tablet 3  . azelastine (ASTELIN) 0.1 % nasal spray Place 2 sprays into both nostrils 2 (two) times daily. Use in each nostril as directed    . B-D INS SYR MICROFINE 1CC/28G 28G X 1/2" 1 ML MISC USE 1 SYRINGE DAILY AFTER SUPPER 100 each 1  . B-D INS SYR MICROFINE 1CC/28G 28G X 1/2" 1 ML MISC USE 1 SYRINGE DAILY AFTER SUPPER 100 each 3  . BD INTEGRA SYRINGE 25G X 1" 3 ML MISC FOR USE WITH B12 INJECTIONS WEEKLY/MONTHLY 25 each 0  . blood glucose meter kit and supplies KIT Dispense based on patient and insurance preference. Use up to four times daily as directed. (FOR ICD-E11.21) 1 each 0  . buPROPion (WELLBUTRIN XL) 300 MG 24 hr tablet Take 450 mg by mouth daily.     Marland Kitchen EPINEPHrine 0.3 mg/0.3 mL IJ SOAJ injection     . fluticasone (FLONASE) 50 MCG/ACT nasal spray PLACE 2 SPRAYS INTO BOTH NOSTRILS DAILY. 16 g 3  . glipiZIDE (GLUCOTROL) 5 MG tablet TAKE 1  TABLET BY MOUTH TWICE A DAY FOR BEFORE MEALS (Patient taking differently: AS NEEDED) 180 tablet 1  . glucose blood test strip Test blood sugar 5 times daily 150 each 5  . hydrochlorothiazide (MICROZIDE) 12.5 MG capsule TAKE 1 CAPSULE EVERY DAY 90 capsule 0  . insulin NPH Human (NOVOLIN N) 100 UNIT/ML injection Inject 60 to 70 units subq at bedtime 30 mL 5  . Insulin Pen Needle (PEN NEEDLES) 31G X 6 MM MISC For use with victoza /saxenda 100 each 0  . Insulin Syringe-Needle U-100 (B-D INS SYR ULTRAFINE 1CC/31G) 31G X 5/16" 1 ML MISC 1 Syringe by Does not apply route daily. 100 each 0  . Lactulose 20 GM/30ML SOLN 30 ml every 4 hours until constipation is relieved 236 mL 3  . lamoTRIgine (LAMICTAL) 100 MG tablet Take 150 mg by mouth daily.     [name] Device MISC Test blood sugar two times daily 100 each 6  . liraglutide 18 MG/3ML SOPN Inject 0.1 mLs (0.6 mg total) into the skin daily. Increase to 1.2 mg after one week 6 mL 5  . LORazepam (ATIVAN) 0.5 MG tablet Take 0.5 mg by mouth daily. ! To 2 tablets per day as needed for anxiety.    . meclizine (ANTIVERT) 25 MG tablet Take 1 tablet (25 mg total) by mouth 3 (three) times daily as needed for dizziness. 90 tablet 6  . metformin (FORTAMET) 1000 MG (OSM) 24 hr tablet TAKE 2 TABLETS EVERY DAY WITH BREAKFAST 180 tablet 1  . methocarbamol (ROBAXIN) 500 MG tablet TAKE ONE TABLET BY MOUTH FOUR TIMES DAILY  (Patient taking differently: Take one or two tabs four times daily, as needed) 90 tablet 3  . NOVOFINE 32G X 6 MM MISC USE WITH NOVOLOG FLEXPEN AS DIRECTED 100 each 1  . NOVOLOG FLEXPEN 100 UNIT/ML FlexPen INJECT 20 UNITS INTO THE SKIN 3 (THREE) TIMES DAILY WITH MEALS. 90 mL 9  . OnabotulinumtoxinA (BOTOX IJ) Inject as directed every 3 (three) months. For migraine    . oxyCODONE-acetaminophen (PERCOCET) 10-325 MG tablet Take 1 tablet by mouth every 6 (six) hours as needed for pain.    . pregabalin (LYRICA) 50 MG capsule Take 300 mg by mouth at bedtime.      . promethazine (PHENERGAN) 25 MG tablet Take 1  tablet (25 mg total) by mouth every 8 (eight) hours as needed. 15 tablet 5  . SUMAtriptan (IMITREX) 100 MG tablet TAKE 1 TABLET BY MOUTH EVERY DAY AS NEEDED MAY REPEAT IN 2HRS IF HEADACHE PERSISTS. MAX 2 TABS/24 HR 9 tablet 0  . traMADol (ULTRAM) 50 MG tablet Take 1 tablet (50 mg total) by mouth every 6 (six) hours as needed. 90 tablet 5  . VENTOLIN HFA 108 (90 BASE) MCG/ACT inhaler INHALE ONE PUFF BY MOUTH EVERY SIX HOURS AS NEEDED FOR WHEEZING  18 g 3   No current facility-administered medications on file prior to visit.     Allergies  Allergen Reactions  . Avocado Swelling  . Sulfa Drugs Cross Reactors Anaphylaxis  . Adhesive [Tape]   . Amoxicillin Hives  . Clarithromycin   . Honey   . Other Hives and Swelling    All melon  . Watermelon Concentrate      Review of Systems Constitutional: negative for chills, fatigue, fevers and sweats Eyes: negative for irritation, redness and visual disturbance Ears, nose, mouth, throat, and face: negative for hearing loss, nasal congestion, snoring and tinnitus Respiratory: negative for asthma, cough, sputum Cardiovascular: negative for chest pain, dyspnea, exertional chest pressure/discomfort, irregular heart beat, palpitations and syncope Gastrointestinal: negative for abdominal pain, change in bowel habits, nausea and vomiting Genitourinary: negative for abnormal menstrual periods, genital lesions, sexual problems and vaginal discharge, dysuria and urinary incontinence.  Mild vulvar/vaginal irritation, occasionally.  Has a h/o skin yeast infections. Uses nystatin powder for prevention.  Integument/breast: negative for breast lump, breast tenderness and nipple discharge Hematologic/lymphatic: negative for bleeding and easy bruising Musculoskeletal:negative for back pain and muscle weakness Neurological: negative for dizziness, headaches, vertigo and weakness Endocrine: negative for diabetic  symptoms including polydipsia, polyuria and skin dryness Allergic/Immunologic: negative for hay fever and urticaria       Objective:  Blood pressure (!) 155/64, pulse (!) 110, height _0  (1.626 m), weight 258 lb 8 oz (117.3 kg), last menstrual period 12/12/2015. Body mass index is 44.37 kg/m.  General Appearance:    Alert, cooperative, no distress, appears stated age, morbid obesity  Head:    Normocephalic, without obvious abnormality, atraumatic  Eyes:    PERRL, conjunctiva/corneas clear, EOM's intact, both eyes  Ears:    Normal external ear canals, both ears  Nose:   Nares normal, septum midline, mucosa normal, no drainage or sinus tenderness  Throat:   Lips, mucosa, and tongue normal; teeth and gums normal  Neck:   Supple, symmetrical, trachea midline, no adenopathy; thyroid: no enlargement/tenderness/nodules; no carotid bruit or JVD  Back:     Symmetric, no curvature, ROM normal, no CVA tenderness  Lungs:     Clear to auscultation bilaterally, respirations unlabored  Chest Wall:    No tenderness or deformity   Heart:    Regular rate and rhythm, S1 and S2 normal, no murmur, rub or gallop  Breast Exam:    No tenderness, masses, or nipple abnormality  Abdomen:     Soft, non-tender, bowel sounds active all four quadrants, no masses, no organomegaly.    Genitalia:    Pelvic:external genitalia normal, vagina without lesions, discharge, or tenderness, rectovaginal septum  normal. Cervix normal in appearance, no cervical motion tenderness, no adnexal masses or tenderness.  Uterus normal size, shape, mobile, regular contours, nontender.  Rectal:    Normal external sphincter.  No hemorrhoids appreciated. Internal exam not done.   Extremities:   Extremities normal, atraumatic, no cyanosis or edema  Pulses:   2+ and symmetric all extremities  Skin:   Skin color, texture, turgor normal, no rashes or lesions  Lymph nodes:   Cervical, supraclavicular, and axillary nodes normal  Neurologic:    CNII-XII intact, normal strength, sensation and reflexes throughout   .  Labs:  Lab Results  Component Value Date   WBC 8.3 08/25/2015   HGB 11.8 (L) 08/25/2015   HCT 37.7 08/25/2015   MCV 84.7 08/25/2015   PLT 527.0 (H) 08/25/2015    Lab Results  Component Value Date   CREATININE 1.18 02/21/2017   BUN 25 (H) 02/21/2017   NA 141 02/21/2017   K 4.3 02/21/2017   CL 100 02/21/2017   CO2 32 02/21/2017    Lab Results  Component Value Date   ALT 14 02/21/2017   AST 11 02/21/2017   ALKPHOS 111 02/21/2017   BILITOT 0.3 02/21/2017    Lab Results  Component Value Date   TSH 1.91 05/27/2014     Imaging (04/06/2017):  CLINICAL DATA:  Follow-up examination for left ovarian cyst.  EXAM: TRANSABDOMINAL AND TRANSVAGINAL ULTRASOUND OF PELVIS  TECHNIQUE: Both transabdominal and transvaginal ultrasound examinations of the pelvis were performed. Transabdominal technique was performed for global imaging of the pelvis including uterus, ovaries, adnexal regions, and pelvic cul-de-sac. It was necessary to proceed with endovaginal exam following the transabdominal exam to visualize the uterus and ovaries.  COMPARISON:  Prior MRI from 05/04/2011.  FINDINGS: Uterus  Measurements: 7.2 x 2.9 x 3.9 cm. No fibroids or other mass visualized.  Endometrium  Thickness: 4.8 mm. No focal abnormality visualized. Trace fluid noted within the cervix.  Right ovary  Not visualized.  No adnexal mass.  Left ovary  Measurements: 2.6 x 3.8 x 4.3 cm. 3.4 x 3.4 x 2.6 cm cyst with single thin internal septation, similar to previous MRI. No associated vascularity. No other adnexal mass.  Other findings  No abnormal free fluid.  IMPRESSION: 1. 3.4 x 3.4 x 2.6 cm left ovarian cyst with single thin internal septation. This is grossly similar relative to previous MRI from 05/04/2011. Correlation with menopausal status is recommended. If the patient is premenopausal, then no  further follow-up would be recommended for this lesion. If the patient is postmenopausal, then a 1 year follow-up ultrasound is recommended to document stability. This recommendation follows the consensus statement: Management of Asymptomatic Ovarian and Other Adnexal Cysts Imaged at [place]: Society of Radiologists in [place]. Radiology 2010; [phone]. 2. Normal sonographic appearance of the uterus. 3. Nonvisualization of the right ovary.  No right adnexal mass.   Assessment:   Healthy female exam.  Morbid obesity Vulvar irritation Cervical cancer screening Post-menopausal state Multiple comorbidites Left ovarian cyst  Plan:     Blood tests: Patient states she has had labs drawn by her bariatric surgery doctor recently, as well as her PCP.  Breast self exam technique reviewed and patient encouraged to perform self-exam monthly. Contraception: post menopausal status. Discussed healthy lifestyle modifications.  Has a h/o bariatric surgery in the past. Continued to encourage healthy diet.  Mammogram ordered. Pap smear performed today. Await results. Continue routine screening.  Vulvar irritation, no vaginal discharge noted today, usually mild, intermittent. Discussed use of Vagisil, Monistat prn.  Postmenopausal state, currently denies menopausal symptoms. Encouraged intake of Vitamin D and calcium.  Multiple comorbidities, managed by PCP Left ovarian cyst stable from MRI to ultrasound.  Recommend f/u in 1 year to document stability.  Follow up in 1 year for  annual exam, or as needed.       Rubie Maid, MD [facility]

## 2017-06-21 ENCOUNTER — Telehealth: Payer: Self-pay

## 2017-06-21 LAB — PAP IG AND HPV HIGH-RISK
HPV, high-risk: NEGATIVE
PAP Smear Comment: 0

## 2017-06-21 NOTE — Telephone Encounter (Signed)
-----   Message from Rubie Maid, MD sent at 06/21/2017 11:25 AM EDT ----- Please inform of normal pap smear. Continue routine screening.

## 2017-06-21 NOTE — Telephone Encounter (Signed)
Called pt no answer. LM for pt informing her of neg pap. Also sent text for mychart sign up

## 2017-07-07 ENCOUNTER — Other Ambulatory Visit: Payer: Self-pay | Admitting: Internal Medicine

## 2017-07-31 ENCOUNTER — Ambulatory Visit
Admission: RE | Admit: 2017-07-31 | Discharge: 2017-07-31 | Disposition: A | Discharge status: Home / Self Care | Payer: BC Managed Care – PPO | Source: Ambulatory Visit | Attending: Obstetrics and Gynecology | Admitting: Obstetrics and Gynecology

## 2017-07-31 DIAGNOSIS — Z1239 Encounter for other screening for malignant neoplasm of breast: Secondary | ICD-10-CM

## 2017-07-31 DIAGNOSIS — Z1231 Encounter for screening mammogram for malignant neoplasm of breast: Secondary | ICD-10-CM | POA: Diagnosis not present

## 2017-08-01 ENCOUNTER — Other Ambulatory Visit: Payer: Self-pay | Admitting: Internal Medicine

## 2017-08-06 ENCOUNTER — Inpatient Hospital Stay
Admission: RE | Admit: 2017-08-06 | Discharge: 2017-08-06 | Disposition: A | Discharge status: Home / Self Care | Payer: Self-pay | Source: Ambulatory Visit | Attending: *Deleted | Admitting: *Deleted

## 2017-08-06 ENCOUNTER — Other Ambulatory Visit: Payer: Self-pay | Admitting: *Deleted

## 2017-08-06 DIAGNOSIS — Z9289 Personal history of other medical treatment: Secondary | ICD-10-CM

## 2017-08-20 ENCOUNTER — Other Ambulatory Visit: Payer: Self-pay | Admitting: Internal Medicine

## 2017-08-24 ENCOUNTER — Other Ambulatory Visit: Payer: Self-pay | Admitting: Internal Medicine

## 2017-08-30 ENCOUNTER — Other Ambulatory Visit: Payer: Self-pay | Admitting: Internal Medicine

## 2017-09-21 ENCOUNTER — Ambulatory Visit: Payer: BC Managed Care – PPO | Admitting: Podiatry

## 2017-10-05 ENCOUNTER — Other Ambulatory Visit: Payer: Self-pay | Admitting: Internal Medicine

## 2017-10-05 ENCOUNTER — Ambulatory Visit (INDEPENDENT_AMBULATORY_CARE_PROVIDER_SITE_OTHER): Payer: BC Managed Care – PPO | Admitting: Podiatry

## 2017-10-05 ENCOUNTER — Other Ambulatory Visit: Payer: Self-pay | Admitting: Podiatry

## 2017-10-05 ENCOUNTER — Ambulatory Visit (INDEPENDENT_AMBULATORY_CARE_PROVIDER_SITE_OTHER): Payer: BC Managed Care – PPO

## 2017-10-05 DIAGNOSIS — M779 Enthesopathy, unspecified: Secondary | ICD-10-CM

## 2017-10-05 DIAGNOSIS — M659 Synovitis and tenosynovitis, unspecified: Secondary | ICD-10-CM | POA: Diagnosis not present

## 2017-10-05 DIAGNOSIS — M7751 Other enthesopathy of right foot: Secondary | ICD-10-CM | POA: Diagnosis not present

## 2017-10-05 DIAGNOSIS — M79671 Pain in right foot: Secondary | ICD-10-CM

## 2017-10-05 DIAGNOSIS — M778 Other enthesopathies, not elsewhere classified: Secondary | ICD-10-CM

## 2017-10-08 NOTE — Progress Notes (Signed)
   Subjective:  Patient presents today for pain and tenderness to the dorsum of the right foot that began one month ago. She also reports associated pain to the arch and lateral side of the right foot just inferior to the ankle. Patient relates significant pain and tenderness when walking. She states she was walking at onset of the pain and heard and felt a "crunching" sensation". Patient presents for further treatment and evaluation.    Objective / Physical Exam:  General:  The patient is alert and oriented x3 in no acute distress. Dermatology:  Skin is warm, dry and supple bilateral lower extremities. Negative for open lesions or macerations. Vascular:  Palpable pedal pulses bilaterally. No edema or erythema noted. Capillary refill within normal limits. Neurological:  Epicritic and protective threshold grossly intact bilaterally.  Musculoskeletal Exam:  Pain on palpation to the anterior lateral medial aspects of the patient's right ankle. Mild edema noted. Pain with palpation to the right midfoot.  Range of motion within normal limits to all pedal and ankle joints bilateral. Muscle strength 5/5 in all groups bilateral.   Radiographic Exam:  Normal osseous mineralization. Joint spaces preserved. No fracture/dislocation/boney destruction.    Assessment: #1 synovitis of right ankle # right midfoot capsulitis   Plan of Care:  #1 Patient was evaluated. X-Rays reviewed. #2 injection of 0.5 mL Celestone Soluspan injected in the patient's right ankle. #3 Injection of 0.5 mLs Celestone Soluspan injected into the right midfoot. #4 ankle brace dispensed #5 Recommended good shoe gear. #6 patient is to return to clinic when necessary.  Edrick Kins, DPM [facility]  Dr. Edrick Kins, [address]                                        [place], [place] [address]                Office [phone]  Fax [phone]

## 2017-10-08 NOTE — Telephone Encounter (Signed)
Last OV was 04/07/17, last refill was the same day. Please advise, thanks

## 2017-10-11 ENCOUNTER — Other Ambulatory Visit: Payer: Self-pay | Admitting: Internal Medicine

## 2017-10-11 NOTE — Telephone Encounter (Signed)
Refill denied.  Needs to be seen every 6 months for tramadol refills  And every 3 months for diabetes follow up,   Lab Results  Component Value Date   HGBA1C 8.2 02/21/2017

## 2017-10-11 NOTE — Telephone Encounter (Signed)
VM was left regarding pt needing a refill for traMADol (ULTRAM) 50 MG tablet. Thank you!

## 2017-10-12 NOTE — Telephone Encounter (Signed)
Patient  Requesting refill on tramadol  Last visit 04/03/17 ,same as last reill for 90 with 5 refills. OK to fill have pended med.

## 2017-10-12 NOTE — Telephone Encounter (Signed)
DENIED,  FOR THE SECOND TIME SEE LAST REFILL DENIAL MESSAGE OVERDUE FOR DIABETES FOLLOW UP,  WHY ARE MY MESSAGES NOT GETTING READ?

## 2017-10-16 MED ORDER — BETAMETHASONE SOD PHOS & ACET 6 (3-3) MG/ML IJ SUSP: 3.0000 mg | Freq: Once | INTRAMUSCULAR | Status: DC

## 2017-10-31 ENCOUNTER — Telehealth: Payer: Self-pay

## 2017-10-31 NOTE — Telephone Encounter (Signed)
Copied from [place]. Topic: General - Other >> Oct 31, 2017 10:10 AM Conception Chancy, NT wrote: Reason for CRM: [facility] to figure out what kind of insulin the pt is on, and is she on all the ones that show on her med list. They need a call back

## 2017-11-05 NOTE — Telephone Encounter (Signed)
LMTCB. Need to clarify with pt what insulins she is actually using so that we can let the pharmacy know.

## 2017-11-07 NOTE — Telephone Encounter (Signed)
[facility] [place] and informed them of the insulins that the pt is currently using.

## 2017-11-07 NOTE — Telephone Encounter (Signed)
Pt returned call. She is on novalin-N, Novalog, and victoza  [facility] [place] [phone]

## 2017-11-16 ENCOUNTER — Other Ambulatory Visit: Payer: Self-pay | Admitting: Internal Medicine

## 2017-11-16 ENCOUNTER — Ambulatory Visit: Payer: Self-pay | Admitting: *Deleted

## 2017-11-16 NOTE — Telephone Encounter (Signed)
Notified patient PCP out of office and would forward request on her return but PCP refused on  10/05/17 due to patient has not been in office for FU.  Patient said she will try tylenol until PCP is back in office .

## 2017-11-16 NOTE — Telephone Encounter (Signed)
Patient is requesting refill on Tramadol she takes for fibromyalgia. I saw the 10/26 request and the physician's response. However, she asked if I would request again for her since she has an appointment scheduled for 11/20/17. Last office visit 4/24

## 2017-11-16 NOTE — Telephone Encounter (Signed)
  Reason for Disposition . Diabetes - foot problems and questions  Answer Assessment - Initial Assessment Questions 1. SYMPTOM: "What's the main symptom you're concerned about?" (e.g., rash, sore, callus, drainage, numbness)     Red spot on corner of big toe toenail 2. LOCATION: "Where is the  _______ located?" (e.g., foot/toe, top/bottom, left/right)   Left big toenail on inner aspect 3. ONSET: "When did the  ________  start?"     Redness noticed 4 days ago 4. PAIN: "Is there any pain?" If so, ask: "How bad is it?" (Scale: 1-10; mild, moderate, severe)    mild 5. CAUSE: "What do you think is causing the symptoms?"     Ingrown toenail possibly 6. OTHER SYMPTOMS: "Do you have any other symptoms?" (e.g., fever, weakness)     Day 1 and 2 it felt warm to touch 7. PREGNANCY: "Is there any chance you are pregnant?" "When was your last menstrual period?"     no  Protocols used: DIABETES - FOOT PROBLEMS AND QUESTIONS-A-AH

## 2017-11-20 ENCOUNTER — Ambulatory Visit: Payer: Self-pay | Admitting: Internal Medicine

## 2017-11-21 MED ORDER — TRAMADOL HCL 50 MG PO TABS: 50.0000 mg | ORAL_TABLET | Freq: Four times a day (QID) | ORAL | 0 refills | Status: DC | PRN

## 2017-11-21 NOTE — Telephone Encounter (Signed)
Refilled: 04/03/2017 Refused: 10/05/2017 Last OV: 04/03/2017 Next OV: 11/22/2017

## 2017-11-21 NOTE — Telephone Encounter (Signed)
90 DAY REFILL GIVEN

## 2017-11-22 ENCOUNTER — Ambulatory Visit: Payer: BC Managed Care – PPO | Admitting: Internal Medicine

## 2017-11-22 ENCOUNTER — Telehealth: Payer: Self-pay | Admitting: Internal Medicine

## 2017-11-22 ENCOUNTER — Encounter: Payer: Self-pay | Admitting: Internal Medicine

## 2017-11-22 VITALS — BP 146/88 | HR 99 | Temp 97.5°F | Resp 16 | Ht 64.0 in | Wt 258.2 lb

## 2017-11-22 DIAGNOSIS — E11649 Type 2 diabetes mellitus with hypoglycemia without coma: Secondary | ICD-10-CM | POA: Diagnosis not present

## 2017-11-22 DIAGNOSIS — E559 Vitamin D deficiency, unspecified: Secondary | ICD-10-CM

## 2017-11-22 DIAGNOSIS — D649 Anemia, unspecified: Secondary | ICD-10-CM | POA: Diagnosis not present

## 2017-11-22 DIAGNOSIS — I10 Essential (primary) hypertension: Secondary | ICD-10-CM | POA: Diagnosis not present

## 2017-11-22 DIAGNOSIS — L03032 Cellulitis of left toe: Secondary | ICD-10-CM | POA: Diagnosis not present

## 2017-11-22 LAB — POCT GLYCOSYLATED HEMOGLOBIN (HGB A1C): Hemoglobin A1C: 8.2

## 2017-11-22 MED ORDER — CEPHALEXIN 750 MG PO CAPS: 750.0000 mg | ORAL_CAPSULE | Freq: Three times a day (TID) | ORAL | 0 refills | Status: DC

## 2017-11-22 NOTE — Telephone Encounter (Signed)
rx printed, signed and faxed
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
## 2017-11-22 NOTE — Telephone Encounter (Signed)
Can we change mg and duration to 250 mg TID?

## 2017-11-22 NOTE — Progress Notes (Signed)
Subjective:  Patient ID: Maria Bentley, female    DOB: 1971/10/10  Age: 46 y.o. MRN: [id number]  CC: The primary encounter diagnosis was Uncontrolled type 2 diabetes mellitus with hypoglycemia without coma ([facility]). Diagnoses of Vitamin D deficiency, Morbid obesity ([place]), Anemia, unspecified type, Paronychia of great toe of left foot, and Essential hypertension were also pertinent to this visit.  HPI Maria Bentley presents for follow up on uncontrolled diabetes,  Morbid obesity despite gastric bypass .  Last seen in march  Weight is down 6 lbs.   Lab Results  Component Value Date   HGBA1C 8.2 11/22/2017   A1c Unchanged . Has stopped victoza and glipizde due to recurrent low blood sugars.  Having recurrent lows in the early and late evening  .  Has been taking regular insulin at breakfast lunch and dinner using a SS.    And NPH at 9 pm.  50 units .  Her fastings range from 120 to 273 . Lows occurring mid to late afternoon and early evening .  Averaging a 15 unit dose at lunch   Base reduced to 10 units with a 3 unit incremental increase,  Resumed the 20 and 5 in the mornign for sugars > 200  Paronychia  left great toe  Elevated blood pressure        Lab Results  Component Value Date   HGBA1C 8.2 11/22/2017   Lab Results  Component Value Date   TSH 1.69 11/22/2017     Outpatient Medications Prior to Visit  Medication Sig Dispense Refill  . acetaminophen (TYLENOL) 325 MG tablet Take by mouth.    Marland Kitchen atorvastatin (LIPITOR) 20 MG tablet TAKE 1 TABLET (20 MG TOTAL) BY MOUTH DAILY. 90 tablet 0  . azelastine (ASTELIN) 0.1 % nasal spray Place 2 sprays into both nostrils 2 (two) times daily. Use in each nostril as directed    . B-D INS SYR MICROFINE 1CC/28G 28G X 1/2" 1 ML MISC USE 1 SYRINGE DAILY AFTER SUPPER 100 each 1  . B-D INS SYR MICROFINE 1CC/28G 28G X 1/2" 1 ML MISC USE 1 SYRINGE DAILY AFTER SUPPER 100 each 3  . BD INTEGRA SYRINGE 25G X 1" 3 ML MISC FOR USE WITH  B12 INJECTIONS WEEKLY/MONTHLY 25 each 0  . blood glucose meter kit and supplies KIT Dispense based on patient and insurance preference. Use up to four times daily as directed. (FOR ICD-E11.21) 1 each 0  . buPROPion (WELLBUTRIN XL) 300 MG 24 hr tablet Take 450 mg by mouth daily.     Marland Kitchen EPINEPHrine 0.3 mg/0.3 mL IJ SOAJ injection     . escitalopram (LEXAPRO) 20 MG tablet Take 20 mg by mouth.     . fluticasone (FLONASE) 50 MCG/ACT nasal spray PLACE 2 SPRAYS INTO BOTH NOSTRILS DAILY. 16 g 3  . glucose blood test strip Test blood sugar 5 times daily 150 each 5  . insulin aspart (NOVOLOG) 100 UNIT/ML FlexPen Inject into the skin.    Marland Kitchen insulin NPH Human (NOVOLIN N) 100 UNIT/ML injection Inject 60 to 70 units subq at bedtime 30 mL 5  . Insulin Pen Needle (PEN NEEDLES) 31G X 6 MM MISC For use with victoza /saxenda 100 each 0  . Insulin Syringe-Needle U-100 (B-D INS SYR ULTRAFINE 1CC/31G) 31G X 5/16" 1 ML MISC 1 Syringe by Does not apply route daily. 100 each 0  . Lactulose 20 GM/30ML SOLN 30 ml every 4 hours until constipation is relieved 236 mL 3  .  lamoTRIgine (LAMICTAL) 100 MG tablet Take 150 mg by mouth daily.     [name] Device MISC Test blood sugar two times daily 100 each 6  . LORazepam (ATIVAN) 0.5 MG tablet Take 0.5 mg by mouth daily. ! To 2 tablets per day as needed for anxiety.    . meclizine (ANTIVERT) 25 MG tablet Take 1 tablet (25 mg total) by mouth 3 (three) times daily as needed for dizziness. 90 tablet 6  . metformin (FORTAMET) 1000 MG (OSM) 24 hr tablet TAKE 2 TABLETS EVERY DAY WITH BREAKFAST 180 tablet 1  . NOVOFINE 32G X 6 MM MISC USE WITH NOVOLOG FLEXPEN AS DIRECTED 100 each 1  . NOVOLOG FLEXPEN 100 UNIT/ML FlexPen INJECT 20 UNITS INTO THE SKIN 3 (THREE) TIMES DAILY WITH MEALS. 6 pen 8  . nystatin (MYCOSTATIN/NYSTOP) powder APPLY TOPICALLY 2 (TWO) TIMES DAILY. 60 g 1  . OnabotulinumtoxinA (BOTOX IJ) Inject as directed every 3 (three) months. For migraine    . oxyCODONE-acetaminophen  (PERCOCET) 10-325 MG tablet Take 1 tablet by mouth every 6 (six) hours as needed for pain.    . pregabalin (LYRICA) 50 MG capsule Take 300 mg by mouth at bedtime.     . promethazine (PHENERGAN) 25 MG tablet Take 1 tablet (25 mg total) by mouth every 8 (eight) hours as needed. 15 tablet 5  . SUMAtriptan (IMITREX) 100 MG tablet TAKE 1 TABLET BY MOUTH EVERY DAY AS NEEDED MAY REPEAT IN 2HRS IF HEADACHE PERSISTS. MAX 2 TABS/24 HR 9 tablet 0  . topiramate (TOPAMAX) 25 MG tablet Take by mouth.    . traMADol (ULTRAM) 50 MG tablet Take 1 tablet (50 mg total) by mouth every 6 (six) hours as needed. 90 tablet 0  . VENTOLIN HFA 108 (90 BASE) MCG/ACT inhaler INHALE ONE PUFF BY MOUTH EVERY SIX HOURS AS NEEDED FOR WHEEZING  18 g 3  . glipiZIDE (GLUCOTROL) 5 MG tablet TAKE 1 TABLET BY MOUTH TWICE A DAY FOR BEFORE MEALS (Patient taking differently: AS NEEDED) 180 tablet 1  . hydrochlorothiazide (MICROZIDE) 12.5 MG capsule TAKE 1 CAPSULE BY MOUTH EVERY DAY 90 capsule 0  . VICTOZA 18 MG/3ML SOPN INJECT 0.1 MLS (0.6 MG TOTAL) INTO THE SKIN DAILY. INCREASE TO 1.2 MG AFTER ONE WEEK 6 pen 5  . liraglutide (VICTOZA) 18 MG/3ML SOPN Inject into the skin.    Marland Kitchen liraglutide 18 MG/3ML SOPN Inject 0.1 mLs (0.6 mg total) into the skin daily. Increase to 1.2 mg after one week (Patient not taking: Reported on 11/22/2017) 6 mL 5  . memantine (NAMENDA) 5 MG tablet Take by mouth.    . methocarbamol (ROBAXIN) 500 MG tablet TAKE ONE TABLET BY MOUTH FOUR TIMES DAILY  (Patient not taking: Reported on 11/22/2017) 90 tablet 3  . pregabalin (LYRICA) 100 MG capsule Take by mouth.     Facility-Administered Medications Prior to Visit  Medication Dose Route Frequency Provider Last Rate Last Dose  . betamethasone acetate-betamethasone sodium phosphate (CELESTONE) injection 3 mg  3 mg Intramuscular Once Edrick Kins, DPM        Review of Systems;  Patient denies headache, fevers, malaise, unintentional weight loss, skin rash, eye pain,  sinus congestion and sinus pain, sore throat, dysphagia,  hemoptysis , cough, dyspnea, wheezing, chest pain, palpitations, orthopnea, edema, abdominal pain, nausea, melena, diarrhea, constipation, flank pain, dysuria, hematuria, urinary  Frequency, nocturia, numbness, tingling, seizures,  Focal weakness, Loss of consciousness,  Tremor, insomnia, depression, anxiety, and suicidal ideation.  Objective:  BP (!) 146/88 (BP Location: Left Arm, Patient Position: Sitting, Cuff Size: Normal)   Pulse 99   Temp (!) 97.5 F (36.4 C) (Oral)   Resp 16   Ht 5' 4"  (1.626 m)   Wt 258 lb 3.2 oz (117.1 kg)   LMP 05/09/2016 Comment: are irregular  SpO2 96%   BMI 44.32 kg/m   BP Readings from Last 3 Encounters:  11/22/17 (!) 146/88  06/19/17 (!) 155/64  04/03/17 110/72    Wt Readings from Last 3 Encounters:  11/22/17 258 lb 3.2 oz (117.1 kg)  06/19/17 258 lb 8 oz (117.3 kg)  04/03/17 259 lb 3.2 oz (117.6 kg)    General appearance: alert, cooperative and appears stated age Ears: normal TM's and external ear canals both ears Throat: lips, mucosa, and tongue normal; teeth and gums normal Neck: no adenopathy, no carotid bruit, supple, symmetrical, trachea midline and thyroid not enlarged, symmetric, no tenderness/mass/nodules Back: symmetric, no curvature. ROM normal. No CVA tenderness. Lungs: clear to auscultation bilaterally Heart: regular rate and rhythm, S1, S2 normal, no murmur, click, rub or gallop Abdomen: soft, non-tender; bowel sounds normal; no masses,  no organomegaly Pulses: 2+ and symmetric Skin: Skin color, texture, turgor normal. No rashes or lesions Lymph nodes: Cervical, supraclavicular, and axillary nodes normal.  Lab Results  Component Value Date   HGBA1C 8.2 11/22/2017   HGBA1C 8.2 02/21/2017   HGBA1C 9.1 (H) 06/27/2016    Lab Results  Component Value Date   CREATININE 1.50 (H) 11/22/2017   CREATININE 1.18 02/21/2017   CREATININE 1.17 06/27/2016    Lab Results    Component Value Date   WBC 10.2 11/22/2017   HGB 11.6 (L) 11/22/2017   HCT 40.2 11/22/2017   PLT 659.0 (H) 11/22/2017   GLUCOSE 83 11/22/2017   CHOL 119 11/22/2017   TRIG 134.0 11/22/2017   HDL 49.90 11/22/2017   LDLDIRECT 92.0 02/21/2017   LDLCALC 42 11/22/2017   ALT 17 11/22/2017   AST 12 11/22/2017   NA 140 11/22/2017   K 4.6 11/22/2017   CL 101 11/22/2017   CREATININE 1.50 (H) 11/22/2017   BUN 22 11/22/2017   CO2 32 11/22/2017   TSH 1.69 11/22/2017   HGBA1C 8.2 11/22/2017   MICROALBUR <0.7 11/22/2017    Mm Outside Films Mammo  Result Date: 08/06/2017 This examination belongs to an outside facility and is stored here for comparison purposes only.  Contact the originating outside institution for any associated report or interpretation.   Assessment & Plan:   Problem List Items Addressed This Visit    Vitamin D deficiency   Relevant Orders   VITAMIN D 25 Hydroxy (Vit-D Deficiency, Fractures) (Completed)   Morbid obesity ([place])   Relevant Orders   TSH (Completed)   Diabetes mellitus type 2, uncontrolled ([place]) - Primary    Sliding scale and NPH doses adjusted. Mealtime  Insulin base doses of  20,  10 AND 10 . With incremental increases of 5 units .  NPH does changed to dinnertime,  Still 50 units. Freestyle Libre glucose meter encouraged.  victoza and glipizide stopped.       Relevant Orders   POCT HgB A1C (Completed)   Comprehensive metabolic panel (Completed)   Lipid panel (Completed)   Microalbumin / creatinine urine ratio (Completed)   Hypertension    CR has dropped .  Will stop hctz and use metoprolol.   Lab Results  Component Value Date   CREATININE 1.50 (H) 11/22/2017   Lab  Results  Component Value Date   NA 140 11/22/2017   K 4.6 11/22/2017   CL 101 11/22/2017   CO2 32 11/22/2017   Lab Results  Component Value Date   MICROALBUR <0.7 11/22/2017         Relevant Medications   metoprolol tartrate (LOPRESSOR) 25 MG tablet   Paronychia of great  toe of left foot    Cephalexin prescribed       Relevant Medications   cephALEXin (KEFLEX) 750 MG capsule    Other Visit Diagnoses    Anemia, unspecified type       Relevant Orders   Iron, TIBC and Ferritin Panel (Completed)   CBC with Differential/Platelet (Completed)   B12 and Folate Panel (Completed)     A total of 40 minutes was spent with patient more than half of which was spent in counseling patient on the above mentioned issues , reviewing and explaining recent labs and imaging studies done, and coordination of care.   I have discontinued Anamika Kueker. Ament's methocarbamol, liraglutide, glipiZIDE, memantine, liraglutide, VICTOZA, and hydrochlorothiazide. I am also having her start on cephALEXin, FREESTYLE LIBRE 14 DAY SENSOR, FREESTYLE LIBRE 14 DAY READER, and metoprolol tartrate. Additionally, I am having her maintain her Lancet Device, Insulin Syringe-Needle U-100, pregabalin, OnabotulinumtoxinA (BOTOX IJ), VENTOLIN HFA, azelastine, lamoTRIgine, LORazepam, meclizine, buPROPion, oxyCODONE-acetaminophen, Lactulose, B-D INS SYR MICROFINE 1CC/28G, blood glucose meter kit and supplies, NOVOFINE, BD INTEGRA SYRINGE, EPINEPHrine, insulin NPH Human, Pen Needles, glucose blood, SUMAtriptan, promethazine, metformin, fluticasone, B-D INS SYR MICROFINE 1CC/28G, escitalopram, topiramate, acetaminophen, insulin aspart, NOVOLOG FLEXPEN, nystatin, atorvastatin, and traMADol. We will continue to administer betamethasone acetate-betamethasone sodium phosphate.  Meds ordered this encounter  Medications  . cephALEXin (KEFLEX) 750 MG capsule    Sig: Take 1 capsule (750 mg total) by mouth 3 (three) times daily.    Dispense:  21 capsule    Refill:  0  . Continuous Blood Gluc Sensor (FREESTYLE LIBRE 14 DAY SENSOR) MISC    Sig: 1 applicator by Does not apply route 4 (four) times daily.    Dispense:  1 each    Refill:  1  . Continuous Blood Gluc Receiver (FREESTYLE LIBRE 14 DAY READER) DEVI    Sig: 1  applicator by Does not apply route 4 (four) times daily.    Dispense:  1 Device    Refill:  1  . metoprolol tartrate (LOPRESSOR) 25 MG tablet    Sig: Take 1 tablet (25 mg total) by mouth 2 (two) times daily.    Dispense:  180 tablet    Refill:  3    Medications Discontinued During This Encounter  Medication Reason  . liraglutide (VICTOZA) 18 LF/8BO SOPN Duplicate  . memantine (NAMENDA) 5 MG tablet Patient has not taken in last 30 days  . methocarbamol (ROBAXIN) 500 MG tablet Patient has not taken in last 30 days  . pregabalin (LYRICA) 100 MG capsule Patient has not taken in last 30 days  . liraglutide 18 FB/5ZW SOPN Duplicate  . glipiZIDE (GLUCOTROL) 5 MG tablet   . VICTOZA 18 MG/3ML SOPN   . hydrochlorothiazide (MICROZIDE) 12.5 MG capsule     Follow-up: Return in about 3 months (around 02/20/2018).   [name], MD

## 2017-11-22 NOTE — Patient Instructions (Addendum)
Reduce the noontime insulin  Dose to 10 units flat (only if BS is > 160)   Resume to 20 units for your baseline morning insulin dose and add 5 units for every increment of 50 over 200 .  I am going to try to get you the Freestyle Libre glucose monitoring system   Reduce the NPH  Dose to 50 units and take it with dinner (take with your mealtime insulin )  Reduce your mealtime insulin at dinner to a base of 10 units  And add 5 for every 50 pts Over 200   Stay off the glipizide  And victoza   Continue the metformin      Breakfast choices that are low carb:  Danton Clap now makes a frozen breakfast frittata and an egg sandwhich (no bread)   that can be microwaved in 2 minutes and is very low carb. Frittats are similar to quiches without the crust  Truett Perna also makes a microwaveable egg product called  ('just crack an egg")

## 2017-11-23 ENCOUNTER — Encounter: Payer: Self-pay | Admitting: Internal Medicine

## 2017-11-23 ENCOUNTER — Other Ambulatory Visit: Payer: Self-pay | Admitting: Internal Medicine

## 2017-11-23 LAB — LIPID PANEL
Cholesterol: 119 mg/dL (ref 0–200)
HDL: 49.9 mg/dL (ref >39.00)
LDL CALC: 42 mg/dL (ref 0–99)
NONHDL: 69.08
Total CHOL/HDL Ratio: 2
Triglycerides: 134 mg/dL (ref 0.0–149.0)
VLDL: 26.8 mg/dL (ref 0.0–40.0)

## 2017-11-23 LAB — TSH: TSH: 1.69 u[IU]/mL (ref 0.35–4.50)

## 2017-11-23 LAB — B12 AND FOLATE PANEL
Folate: 9.7 ng/mL (ref >5.9)
Vitamin B-12: 195 pg/mL — ABNORMAL LOW (ref 211–911)

## 2017-11-23 LAB — COMPREHENSIVE METABOLIC PANEL
ALT: 17 U/L (ref 0–35)
AST: 12 U/L (ref 0–37)
Albumin: 3.5 g/dL (ref 3.5–5.2)
Alkaline Phosphatase: 113 U/L (ref 39–117)
BUN: 22 mg/dL (ref 6–23)
CHLORIDE: 101 meq/L (ref 96–112)
CO2: 32 meq/L (ref 19–32)
CREATININE: 1.5 mg/dL — AB (ref 0.40–1.20)
Calcium: 9.2 mg/dL (ref 8.4–10.5)
GFR: 39.5 mL/min — ABNORMAL LOW (ref >60.00)
Glucose, Bld: 83 mg/dL (ref 70–99)
POTASSIUM: 4.6 meq/L (ref 3.5–5.1)
SODIUM: 140 meq/L (ref 135–145)
Total Bilirubin: 0.2 mg/dL (ref 0.2–1.2)
Total Protein: 6.4 g/dL (ref 6.0–8.3)

## 2017-11-23 LAB — CBC WITH DIFFERENTIAL/PLATELET
BASOS ABS: 0.1 10*3/uL (ref 0.0–0.1)
Basophils Relative: 1 % (ref 0.0–3.0)
EOS PCT: 1.7 % (ref 0.0–5.0)
Eosinophils Absolute: 0.2 10*3/uL (ref 0.0–0.7)
HCT: 40.2 % (ref 36.0–46.0)
Hemoglobin: 11.6 g/dL — ABNORMAL LOW (ref 12.0–15.0)
Lymphocytes Relative: 42.7 % (ref 12.0–46.0)
Lymphs Abs: 4.4 10*3/uL — ABNORMAL HIGH (ref 0.7–4.0)
MCV: 84.8 fl (ref 78.0–100.0)
MONO ABS: 0.6 10*3/uL (ref 0.1–1.0)
Monocytes Relative: 6 % (ref 3.0–12.0)
NEUTROS ABS: 5 10*3/uL (ref 1.4–7.7)
Neutrophils Relative %: 48.6 % (ref 43.0–77.0)
PLATELETS: 659 10*3/uL — AB (ref 150.0–400.0)
RBC: 4.74 Mil/uL (ref 3.87–5.11)
RDW: 18.8 % — ABNORMAL HIGH (ref 11.5–15.5)
WBC: 10.2 10*3/uL (ref 4.0–10.5)

## 2017-11-23 LAB — MICROALBUMIN / CREATININE URINE RATIO
Creatinine,U: 47.4 mg/dL
Microalb Creat Ratio: 1.5 mg/g (ref 0.0–30.0)

## 2017-11-23 LAB — IRON,TIBC AND FERRITIN PANEL
%SAT: 6 % — AB (ref 11–50)
Ferritin: 8 ng/mL — ABNORMAL LOW (ref 10–232)
Iron: 25 ug/dL — ABNORMAL LOW (ref 40–190)
TIBC: 392 ug/dL (ref 250–450)

## 2017-11-23 LAB — VITAMIN D 25 HYDROXY (VIT D DEFICIENCY, FRACTURES): VITD: 13.27 ng/mL — AB (ref 30.00–100.00)

## 2017-11-23 MED ORDER — ERGOCALCIFEROL 1.25 MG (50000 UT) PO CAPS: 50000.0000 [IU] | ORAL_CAPSULE | ORAL | 3 refills | Status: DC

## 2017-11-23 MED ORDER — SYRINGE 25G X 1" 3 ML MISC
0 refills | Status: DC
Start: 2017-11-23 — End: 2018-03-11

## 2017-11-23 MED ORDER — CYANOCOBALAMIN 1000 MCG/ML IJ SOLN: 1000.0000 ug | INTRAMUSCULAR | 2 refills | Status: DC

## 2017-11-23 MED ORDER — FERROUS FUM-IRON POLYSACCH 162-115.2 MG PO CAPS: 1.0000 | ORAL_CAPSULE | Freq: Every day | ORAL | 1 refills | Status: DC

## 2017-11-23 NOTE — Telephone Encounter (Signed)
Yes please hANDLE    YOU WILL NEED TO CHANGE QTY TO 3 CAPSULES TID X 7DAYS  QTY #63

## 2017-11-23 NOTE — Progress Notes (Unsigned)
drisdo 

## 2017-11-23 NOTE — Telephone Encounter (Signed)
Called in to [facility] and gave verbal to change prescription

## 2017-11-24 ENCOUNTER — Telehealth: Payer: Self-pay | Admitting: Internal Medicine

## 2017-11-24 DIAGNOSIS — L03032 Cellulitis of left toe: Secondary | ICD-10-CM | POA: Insufficient documentation

## 2017-11-24 DIAGNOSIS — I10 Essential (primary) hypertension: Secondary | ICD-10-CM | POA: Insufficient documentation

## 2017-11-24 MED ORDER — FREESTYLE LIBRE 14 DAY READER DEVI: 1.0000 | Freq: Four times a day (QID) | 1 refills | Status: DC

## 2017-11-24 MED ORDER — METOPROLOL TARTRATE 25 MG PO TABS: 25.0000 mg | ORAL_TABLET | Freq: Two times a day (BID) | ORAL | 3 refills | Status: DC

## 2017-11-24 MED ORDER — FREESTYLE LIBRE 14 DAY SENSOR MISC: 1.0000 | Freq: Four times a day (QID) | 1 refills | Status: DC

## 2017-11-24 NOTE — Assessment & Plan Note (Addendum)
Sliding scale and NPH doses adjusted. Mealtime  Insulin base doses of  20,  10 AND 10 . With incremental increases of 5 units .  NPH does changed to dinnertime,  Still 50 units. Freestyle Libre glucose meter encouraged.  victoza and glipizide stopped.

## 2017-11-24 NOTE — Assessment & Plan Note (Signed)
Cephalexin prescribed.  

## 2017-11-24 NOTE — Assessment & Plan Note (Signed)
CR has dropped .  Will stop hctz and use metoprolol.   Lab Results  Component Value Date   CREATININE 1.50 (H) 11/22/2017   Lab Results  Component Value Date   NA 140 11/22/2017   K 4.6 11/22/2017   CL 101 11/22/2017   CO2 32 11/22/2017   Lab Results  Component Value Date   MICROALBUR <0.7 11/22/2017

## 2017-11-25 ENCOUNTER — Other Ambulatory Visit: Payer: Self-pay | Admitting: Internal Medicine

## 2017-11-27 ENCOUNTER — Other Ambulatory Visit: Payer: Self-pay | Admitting: Internal Medicine

## 2017-12-05 NOTE — Telephone Encounter (Signed)
Chart updated

## 2017-12-22 ENCOUNTER — Other Ambulatory Visit: Payer: Self-pay | Admitting: Internal Medicine

## 2017-12-25 NOTE — Telephone Encounter (Signed)
Okay to refill? Last written on: 11/21/17 #90 with no refill.  LOV: 11/22/17 NOV: 02/20/18

## 2017-12-26 ENCOUNTER — Other Ambulatory Visit: Payer: Self-pay | Admitting: Internal Medicine

## 2017-12-26 ENCOUNTER — Encounter: Payer: Self-pay | Admitting: Internal Medicine

## 2017-12-26 NOTE — Telephone Encounter (Deleted)
Printed, signed and faxed.  

## 2017-12-27 ENCOUNTER — Other Ambulatory Visit: Payer: Self-pay | Admitting: Internal Medicine

## 2017-12-31 ENCOUNTER — Other Ambulatory Visit: Payer: Self-pay | Admitting: Internal Medicine

## 2018-01-03 NOTE — Telephone Encounter (Signed)
Refilled: 04/03/2017 Last OV: 11/22/2017 Next OV: 02/20/2018

## 2018-01-14 ENCOUNTER — Telehealth: Payer: Self-pay | Admitting: *Deleted

## 2018-01-14 NOTE — Telephone Encounter (Signed)
Patient called and states that she has a bump in a vulva area and she can barely sit. Patient has an appt tomorrow w/ Dr. Marcelline Mates . She is wondering what she can do in the meantime to help. Patient is requesting a call back . Her contact # is  [phone]. Please advise. Thank you

## 2018-01-14 NOTE — Telephone Encounter (Signed)
Pt thinks she has a bartholin cyst. She has had it for 3 days. Its getting bigger, more painful and red. NO fevers, or drainage. Pt states it is hard. Advised sitz bath, hot compress, and ibup. Pt has an appt wit [facility] tomorrow.

## 2018-01-15 ENCOUNTER — Ambulatory Visit: Payer: BC Managed Care – PPO | Admitting: Obstetrics and Gynecology

## 2018-01-15 ENCOUNTER — Encounter: Payer: Self-pay | Admitting: Obstetrics and Gynecology

## 2018-01-15 VITALS — BP 136/91 | HR 110 | Wt 258.8 lb

## 2018-01-15 DIAGNOSIS — L0231 Cutaneous abscess of buttock: Secondary | ICD-10-CM | POA: Diagnosis not present

## 2018-01-15 MED ORDER — CEPHALEXIN 500 MG PO CAPS: 500.0000 mg | ORAL_CAPSULE | Freq: Four times a day (QID) | ORAL | 2 refills | Status: DC

## 2018-01-15 NOTE — Progress Notes (Signed)
    GYNECOLOGY PROGRESS NOTE  Subjective:    Patient ID: Maria Bentley, female    DOB: [birth year], 47 y.o.   MRN: [id number]  HPI  Patient is a 47 y.o. [id number] female who presents for complaints of vulvar bump since last Tuesday, got bigger starting Saturday. Notes chills, subjective fever, tenderness in vaginal region. Denies drainage from the area.   The following portions of the patient's history were reviewed and updated as appropriate: allergies, current medications, past family history, past medical history, past social history, past surgical history and problem list.  Review of Systems Pertinent items noted in HPI and remainder of comprehensive ROS otherwise negative.   Objective:   Blood pressure (!) 136/91, pulse (!) 110, weight 258 lb 12.8 oz (117.4 kg), last menstrual period 05/09/2016. General appearance: alert and no distress Pelvic: external genitalia normal, with exception of approximately 2 x 3 cm firm area on right buttock extending upward towards rectum and right labia majora, with surrounding cellulitis. Area is indurated with fluctuance. Tender to touch, no warmth.  Speculum exam not performed.   Assessment:   Abscess of buttock  Plan:   I&D of abscess performed today. Will also prescribe Keflex for cellulitis. Discussed wound care.  To f/u in 1 week if symptoms persist. Culture of abscess fluid obtained.    Rubie Maid, MD [facility]

## 2018-01-15 NOTE — Patient Instructions (Signed)
Skin Abscess A skin abscess is an infected area on or under your skin that contains a collection of pus and other material. An abscess may also be called a furuncle, carbuncle, or boil. An abscess can occur in or on almost any part of your body. Some abscesses break open (rupture) on their own. Most continue to get worse unless they are treated. The infection can spread deeper into the body and eventually into your blood, which can make you feel ill. Treatment usually involves draining the abscess. What are the causes? An abscess occurs when germs, often bacteria, pass through your skin and cause an infection. This may be caused by:  A scrape or cut on your skin.  A puncture wound through your skin, including a needle injection.  Blocked oil or sweat glands.  Blocked and infected hair follicles.  A cyst that forms beneath your skin (sebaceous cyst) and becomes infected.  What increases the risk? This condition is more likely to develop in people who:  Have a weak body defense system (immune system).  Have diabetes.  Have dry and irritated skin.  Get frequent injections or use illegal IV drugs.  Have a foreign body in a wound, such as a splinter.  Have problems with their lymph system or veins.  What are the signs or symptoms? An abscess may start as a painful, firm bump under the skin. Over time, the abscess may get larger or become softer. Pus may appear at the top of the abscess, causing pressure and pain. It may eventually break through the skin and drain. Other symptoms include:  Redness.  Warmth.  Swelling.  Tenderness.  A sore on the skin.  How is this diagnosed? This condition is diagnosed based on your medical history and a physical exam. A sample of pus may be taken from the abscess to find out what is causing the infection and what antibiotics can be used to treat it. You also may have:  Blood tests to look for signs of infection or spread of an infection to  your blood.  Imaging studies such as ultrasound, CT scan, or MRI if the abscess is deep.  How is this treated? Small abscesses that drain on their own may not need treatment. Treatment for an abscess that does not rupture on its own may include:  Warm compresses applied to the area several times per day.  Incision and drainage. Your health care provider will make an incision to open the abscess and will remove pus and any foreign body or dead tissue. The incision area may be packed with gauze to keep it open for a few days while it heals.  Antibiotic medicines to treat infection. For a severe abscess, you may first get antibiotics through an IV and then change to oral antibiotics.  Follow these instructions at home: Abscess Care  If you have an abscess that has not drained, place a warm, clean, wet washcloth over the abscess several times a day. Do this as told by your health care provider.  Follow instructions from your health care provider about how to take care of your abscess. Make sure you: ? Cover the abscess with a bandage (dressing). ? Change your dressing or gauze as told by your health care provider. ? Wash your hands with soap and water before you change the dressing or gauze. If soap and water are not available, use hand sanitizer.  Check your abscess every day for signs of a worsening infection. Check for: ?   More redness, swelling, or pain. ? More fluid or blood. ? Warmth. ? More pus or a bad smell. Medicines  Take over-the-counter and prescription medicines only as told by your health care provider.  If you were prescribed an antibiotic medicine, take it as told by your health care provider. Do not stop taking the antibiotic even if you start to feel better. General instructions  To avoid spreading the infection: ? Do not share personal care items, towels, or hot tubs with others. ? Avoid making skin contact with other people.  Keep all follow-up visits as told by  your health care provider. This is important. Contact a health care provider if:  You have more redness, swelling, or pain around your abscess.  You have more fluid or blood coming from your abscess.  Your abscess feels warm to the touch.  You have more pus or a bad smell coming from your abscess.  You have a fever.  You have muscle aches.  You have chills or a general ill feeling. Get help right away if:  You have severe pain.  You see red streaks on your skin spreading away from the abscess. This information is not intended to replace advice given to you by your health care provider. Make sure you discuss any questions you have with your health care provider. Document Released: 09/06/2005 Document Revised: 07/23/2016 Document Reviewed: 10/06/2015 Elsevier Interactive Patient Education  2018 [place].  Percutaneous Abscess Drain, Care After This sheet gives you information about how to care for yourself after your procedure. Your health care provider may also give you more specific instructions. If you have problems or questions, contact your health care provider. What can I expect after the procedure? After your procedure, it is common to have:  A small amount of bruising and discomfort in the area where the drainage tube (catheter) was placed.  Sleepiness and fatigue. This should go away after the medicines you were given have worn off.  Follow these instructions at home: Incision care  Follow instructions from your health care provider about how to take care of your incision. Make sure you: ? Wash your hands with soap and water before you change your bandage (dressing). If soap and water are not available, use hand sanitizer. ? Change your dressing as told by your health care provider. ? Leave stitches (sutures), skin glue, or adhesive strips in place. These skin closures may need to stay in place for 2 weeks or longer. If adhesive strip edges start to loosen and curl  up, you may trim the loose edges. Do not remove adhesive strips completely unless your health care provider tells you to do that.  Check your incision area every day for signs of infection. Check for: ? More redness, swelling, or pain. ? More fluid or blood. ? Warmth. ? Pus or a bad smell. ? Fluid leaking from around your catheter (instead of fluid draining through your catheter). Catheter care  Follow instructions from your health care provider about emptying and cleaning your catheter and collection bag. You may need to clean the catheter every day so it does not clog.  If directed, write down the following information every time you empty your bag: ? The date and time. ? The amount of drainage. General instructions  Rest at home for 1-2 days after your procedure. Return to your normal activities as told by your health care provider.  Do not take baths, swim, or use a hot tub for 24 hours after  your procedure, or until your health care provider says that this is okay.  Take over-the-counter and prescription medicines only as told by your health care provider.  Keep all follow-up visits as told by your health care provider. This is important. Contact a health care provider if:  You have less than 10 mL of drainage a day for 2-3 days in a row, or as directed by your health care provider.  You have more redness, swelling, or pain around your incision area.  You have more fluid or blood coming from your incision area.  Your incision area feels warm to the touch.  You have pus or a bad smell coming from your incision area.  You have fluid leaking from around your catheter (instead of through your catheter).  You have a fever or chills.  You have pain that does not get better with medicine. Get help right away if:  Your catheter comes out.  You suddenly stop having drainage from your catheter.  You suddenly have blood in the fluid that is draining from your catheter.  You  become dizzy or you faint.  You develop a rash.  You have nausea or vomiting.  You have difficulty breathing or you feel short of breath.  You develop chest pain.  You have problems with your speech or vision.  You have trouble balancing or moving your arms or legs. Summary  It is common to have a small amount of bruising and discomfort in the area where the drainage tube (catheter) was placed.  You may be directed to record the amount of drainage from the bag every time you empty it.  Follow instructions from your health care provider about emptying and cleaning your catheter and collection bag. This information is not intended to replace advice given to you by your health care provider. Make sure you discuss any questions you have with your health care provider. Document Released: 04/13/2014 Document Revised: 10/19/2016 Document Reviewed: 10/19/2016 Elsevier Interactive Patient Education  2017 [employer].

## 2018-01-15 NOTE — Progress Notes (Signed)
PT noticed bump last week on Tuesday on Saturday it was getting larger and more painful.

## 2018-01-18 ENCOUNTER — Ambulatory Visit: Payer: BC Managed Care – PPO | Admitting: Obstetrics and Gynecology

## 2018-01-18 ENCOUNTER — Encounter: Payer: Self-pay | Admitting: Obstetrics and Gynecology

## 2018-01-18 VITALS — BP 131/83 | HR 94 | Wt 263.3 lb

## 2018-01-18 DIAGNOSIS — L0231 Cutaneous abscess of buttock: Secondary | ICD-10-CM

## 2018-01-18 DIAGNOSIS — Z9889 Other specified postprocedural states: Secondary | ICD-10-CM

## 2018-01-18 NOTE — Progress Notes (Signed)
Pt had abscess drained on Tuesday, Jan 15, 2018 on yesterday she noticed an Increased in pain at abscess site area and  increased in puss and drainage

## 2018-01-18 NOTE — Progress Notes (Signed)
    GYNECOLOGY PROGRESS NOTE  Subjective:    Patient ID: Maria Bentley, female    DOB: 1971/10/31, 47 y.o.   MRN: [id number]  HPI  Patient is a 47 y.o. G0P0000 female who presents for complaints of increased pain at recent abscess drainage site.  She underwent an I&D of a right buttock abscess 3 days ago.  She has been taking her antibiotics and performing Sitz baths. Notes that initially the area felt is was getting better, however last night she started noticing increased drainage again as well as more tenderness.  Notes she wanted to have it checked out.   The following portions of the patient's history were reviewed and updated as appropriate: allergies, current medications, past family history, past medical history, past social history, past surgical history and problem list.  Review of Systems Pertinent items noted in HPI and remainder of comprehensive ROS otherwise negative.   Objective:   Blood pressure 131/83, pulse 94, weight 263 lb 4.8 oz (119.4 kg), last menstrual period 05/09/2016. General appearance: alert and no distress Pelvic: external genitalia normal, with exception of approximately 1 x 2 cm indurated area on right buttock with surrounding cellulitis (decreased in sized and less cellulitis than last exam 3 days ago). New area of drainage noted ~ 1 cm from previous incision site with gray-yellow drainage. Mildly tender to touch, no warmth present.  Speculum exam not performed.     Labs:  Results for orders placed or performed in visit on 01/15/18  Anaerobic and Aerobic Culture  Result Value Ref Range   Anaerobic Culture Final report    Result 1 Comment    Aerobic Culture Final report (A)    Result 1 Raoultella planticola (A)    Result 2 Routine flora    Antimicrobial Susceptibility Comment      Assessment:   Abscess, s/p I&D  Plan:   Discussed with patient that abscess size had improved and most of the drainage has occurred, and new area of drainage  was likely due to deeper pocket of pus that could not be expressed at I&D but had likey softened with sitz baths and antibiotics. Advised to continue current treatments (organsism is senstivie to current antibiotic Keflex). Can begin placing bacitracin as early as tomorrow.  Also will prescribe lidocaine gel to aid with discomfort.  To f/u next week to reassess abscess.    Rubie Maid, MD [facility]

## 2018-01-19 LAB — ANAEROBIC AND AEROBIC CULTURE

## 2018-01-20 ENCOUNTER — Encounter: Payer: Self-pay | Admitting: Obstetrics and Gynecology

## 2018-01-20 MED ORDER — LIDOCAINE HCL 2 % EX GEL: 1.0000 "application " | CUTANEOUS | 2 refills | Status: DC | PRN

## 2018-01-22 ENCOUNTER — Other Ambulatory Visit: Payer: Self-pay

## 2018-01-22 MED ORDER — GLUCOSE BLOOD VI STRP: ORAL_STRIP | 1 refills | Status: DC

## 2018-02-05 ENCOUNTER — Other Ambulatory Visit: Payer: Self-pay | Admitting: Internal Medicine

## 2018-02-20 ENCOUNTER — Telehealth: Payer: Self-pay | Admitting: Obstetrics and Gynecology

## 2018-02-20 ENCOUNTER — Ambulatory Visit: Payer: BC Managed Care – PPO | Admitting: Internal Medicine

## 2018-02-20 NOTE — Telephone Encounter (Signed)
Patient called stating the abscess has returned and she is worried it will get infected. She would like a call back. Thanks

## 2018-02-21 NOTE — Telephone Encounter (Signed)
Pt was called back and voicemail came on but was unable to leave a message due to voicemail being full. Noticed that her MyChart was active sent her a message via MyChart asking her if she was okay and asked her if she needed to see the doctor to please call back and make an appointment.

## 2018-02-22 ENCOUNTER — Other Ambulatory Visit: Payer: Self-pay | Admitting: Internal Medicine

## 2018-03-03 ENCOUNTER — Other Ambulatory Visit: Payer: Self-pay | Admitting: Internal Medicine

## 2018-03-09 ENCOUNTER — Other Ambulatory Visit: Payer: Self-pay | Admitting: Internal Medicine

## 2018-03-10 ENCOUNTER — Other Ambulatory Visit: Payer: Self-pay | Admitting: Internal Medicine

## 2018-03-11 ENCOUNTER — Ambulatory Visit: Payer: Self-pay | Admitting: *Deleted

## 2018-03-11 ENCOUNTER — Inpatient Hospital Stay
Admission: EM | Admit: 2018-03-11 | Discharge: 2018-03-13 | DRG: 872 | Disposition: A | Discharge status: Home / Self Care | Payer: BC Managed Care – PPO | Attending: Internal Medicine | Admitting: Internal Medicine

## 2018-03-11 ENCOUNTER — Encounter: Payer: Self-pay | Admitting: Emergency Medicine

## 2018-03-11 ENCOUNTER — Other Ambulatory Visit: Payer: Self-pay | Admitting: Internal Medicine

## 2018-03-11 ENCOUNTER — Other Ambulatory Visit: Payer: Self-pay

## 2018-03-11 DIAGNOSIS — Z9114 Patient's other noncompliance with medication regimen: Secondary | ICD-10-CM

## 2018-03-11 DIAGNOSIS — Z881 Allergy status to other antibiotic agents status: Secondary | ICD-10-CM

## 2018-03-11 DIAGNOSIS — A419 Sepsis, unspecified organism: Principal | ICD-10-CM | POA: Diagnosis present

## 2018-03-11 DIAGNOSIS — Z91048 Other nonmedicinal substance allergy status: Secondary | ICD-10-CM | POA: Diagnosis not present

## 2018-03-11 DIAGNOSIS — E11628 Type 2 diabetes mellitus with other skin complications: Secondary | ICD-10-CM | POA: Diagnosis present

## 2018-03-11 DIAGNOSIS — Z87442 Personal history of urinary calculi: Secondary | ICD-10-CM

## 2018-03-11 DIAGNOSIS — L0291 Cutaneous abscess, unspecified: Secondary | ICD-10-CM

## 2018-03-11 DIAGNOSIS — Z8249 Family history of ischemic heart disease and other diseases of the circulatory system: Secondary | ICD-10-CM

## 2018-03-11 DIAGNOSIS — G43909 Migraine, unspecified, not intractable, without status migrainosus: Secondary | ICD-10-CM | POA: Diagnosis present

## 2018-03-11 DIAGNOSIS — Z79899 Other long term (current) drug therapy: Secondary | ICD-10-CM

## 2018-03-11 DIAGNOSIS — G4733 Obstructive sleep apnea (adult) (pediatric): Secondary | ICD-10-CM | POA: Diagnosis present

## 2018-03-11 DIAGNOSIS — Z79891 Long term (current) use of opiate analgesic: Secondary | ICD-10-CM

## 2018-03-11 DIAGNOSIS — Z7951 Long term (current) use of inhaled steroids: Secondary | ICD-10-CM

## 2018-03-11 DIAGNOSIS — L03317 Cellulitis of buttock: Secondary | ICD-10-CM | POA: Diagnosis present

## 2018-03-11 DIAGNOSIS — F329 Major depressive disorder, single episode, unspecified: Secondary | ICD-10-CM | POA: Diagnosis present

## 2018-03-11 DIAGNOSIS — I10 Essential (primary) hypertension: Secondary | ICD-10-CM | POA: Diagnosis present

## 2018-03-11 DIAGNOSIS — K611 Rectal abscess: Secondary | ICD-10-CM | POA: Diagnosis present

## 2018-03-11 DIAGNOSIS — E872 Acidosis, unspecified: Secondary | ICD-10-CM

## 2018-03-11 DIAGNOSIS — E785 Hyperlipidemia, unspecified: Secondary | ICD-10-CM | POA: Diagnosis present

## 2018-03-11 DIAGNOSIS — F419 Anxiety disorder, unspecified: Secondary | ICD-10-CM | POA: Diagnosis present

## 2018-03-11 DIAGNOSIS — K610 Anal abscess: Secondary | ICD-10-CM

## 2018-03-11 DIAGNOSIS — D649 Anemia, unspecified: Secondary | ICD-10-CM | POA: Diagnosis present

## 2018-03-11 DIAGNOSIS — Z87891 Personal history of nicotine dependence: Secondary | ICD-10-CM | POA: Diagnosis not present

## 2018-03-11 DIAGNOSIS — Z9884 Bariatric surgery status: Secondary | ICD-10-CM

## 2018-03-11 DIAGNOSIS — R739 Hyperglycemia, unspecified: Secondary | ICD-10-CM

## 2018-03-11 DIAGNOSIS — Z91018 Allergy to other foods: Secondary | ICD-10-CM

## 2018-03-11 DIAGNOSIS — Z818 Family history of other mental and behavioral disorders: Secondary | ICD-10-CM | POA: Diagnosis not present

## 2018-03-11 DIAGNOSIS — Z794 Long term (current) use of insulin: Secondary | ICD-10-CM

## 2018-03-11 DIAGNOSIS — Z880 Allergy status to penicillin: Secondary | ICD-10-CM

## 2018-03-11 DIAGNOSIS — E1165 Type 2 diabetes mellitus with hyperglycemia: Secondary | ICD-10-CM | POA: Diagnosis present

## 2018-03-11 DIAGNOSIS — Z1611 Resistance to penicillins: Secondary | ICD-10-CM | POA: Diagnosis present

## 2018-03-11 DIAGNOSIS — Z833 Family history of diabetes mellitus: Secondary | ICD-10-CM | POA: Diagnosis not present

## 2018-03-11 DIAGNOSIS — Z888 Allergy status to other drugs, medicaments and biological substances status: Secondary | ICD-10-CM

## 2018-03-11 DIAGNOSIS — Z6841 Body Mass Index (BMI) 40.0 and over, adult: Secondary | ICD-10-CM | POA: Diagnosis not present

## 2018-03-11 LAB — URINALYSIS, COMPLETE (UACMP) WITH MICROSCOPIC
Bilirubin Urine: NEGATIVE
Glucose, UA: >=500 mg/dL — AB
Ketones, ur: 20 mg/dL — AB
Leukocytes, UA: NEGATIVE
Nitrite: NEGATIVE
Protein, ur: NEGATIVE mg/dL
Specific Gravity, Urine: 1.025 (ref 1.005–1.030)
pH: 5 (ref 5.0–8.0)

## 2018-03-11 LAB — COMPREHENSIVE METABOLIC PANEL
ALT: 20 U/L (ref 14–54)
ANION GAP: 12 (ref 5–15)
AST: 30 U/L (ref 15–41)
Albumin: 3.8 g/dL (ref 3.5–5.0)
Alkaline Phosphatase: 129 U/L — ABNORMAL HIGH (ref 38–126)
BILIRUBIN TOTAL: 0.5 mg/dL (ref 0.3–1.2)
BUN: 16 mg/dL (ref 6–20)
CALCIUM: 10.1 mg/dL (ref 8.9–10.3)
CO2: 27 mmol/L (ref 22–32)
Chloride: 96 mmol/L — ABNORMAL LOW (ref 101–111)
Creatinine, Ser: 1.23 mg/dL — ABNORMAL HIGH (ref 0.44–1.00)
GFR, EST AFRICAN AMERICAN: 60 mL/min — AB (ref >60)
GFR, EST NON AFRICAN AMERICAN: 51 mL/min — AB (ref >60)
Glucose, Bld: 219 mg/dL — ABNORMAL HIGH (ref 65–99)
POTASSIUM: 3.8 mmol/L (ref 3.5–5.1)
Sodium: 135 mmol/L (ref 135–145)
TOTAL PROTEIN: 8.5 g/dL — AB (ref 6.5–8.1)

## 2018-03-11 LAB — CBC WITH DIFFERENTIAL/PLATELET
BASOS ABS: 0.1 10*3/uL (ref 0–0.1)
BASOS PCT: 1 %
Eosinophils Absolute: 0.2 10*3/uL (ref 0–0.7)
Eosinophils Relative: 1 %
HCT: 41.6 % (ref 35.0–47.0)
Hemoglobin: 12.4 g/dL (ref 12.0–16.0)
Lymphocytes Relative: 22 %
Lymphs Abs: 2.8 10*3/uL (ref 1.0–3.6)
MCH: 23.7 pg — ABNORMAL LOW (ref 26.0–34.0)
MCHC: 29.9 g/dL — ABNORMAL LOW (ref 32.0–36.0)
MCV: 79.2 fL — AB (ref 80.0–100.0)
Monocytes Absolute: 0.8 10*3/uL (ref 0.2–0.9)
Monocytes Relative: 6 %
NEUTROS ABS: 8.6 10*3/uL — AB (ref 1.4–6.5)
Neutrophils Relative %: 70 %
PLATELETS: 536 10*3/uL — AB (ref 150–440)
RBC: 5.25 MIL/uL — AB (ref 3.80–5.20)
RDW: 17.5 % — ABNORMAL HIGH (ref 11.5–14.5)
WBC: 12.4 10*3/uL — AB (ref 3.6–11.0)

## 2018-03-11 LAB — GLUCOSE, CAPILLARY
GLUCOSE-CAPILLARY: 241 mg/dL — AB (ref 65–99)
Glucose-Capillary: 255 mg/dL — ABNORMAL HIGH (ref 65–99)
Glucose-Capillary: 580 mg/dL (ref 65–99)

## 2018-03-11 LAB — LACTIC ACID, PLASMA
LACTIC ACID, VENOUS: 4.6 mmol/L — AB (ref 0.5–1.9)
Lactic Acid, Venous: 1.3 mmol/L (ref 0.5–1.9)

## 2018-03-11 MED ORDER — MORPHINE SULFATE (PF) 4 MG/ML IV SOLN
4.0000 mg | Freq: Once | INTRAVENOUS | Status: AC
  Administered 2018-03-11: 4 mg via INTRAVENOUS
  Filled 2018-03-11: qty 1

## 2018-03-11 MED ORDER — ENOXAPARIN SODIUM 40 MG/0.4ML ~~LOC~~ SOLN
40.0000 mg | Freq: Two times a day (BID) | SUBCUTANEOUS | Status: DC
  Administered 2018-03-11 – 2018-03-13 (×4): 40 mg via SUBCUTANEOUS
  Filled 2018-03-11 (×4): qty 0.4

## 2018-03-11 MED ORDER — INSULIN ASPART 100 UNIT/ML ~~LOC~~ SOLN
14.0000 [IU] | Freq: Once | SUBCUTANEOUS | Status: AC
  Administered 2018-03-11: 14 [IU] via SUBCUTANEOUS
  Filled 2018-03-11: qty 1

## 2018-03-11 MED ORDER — CEFEPIME HCL 2 G IJ SOLR
2.0000 g | Freq: Once | INTRAMUSCULAR | Status: AC
  Administered 2018-03-11: 2 g via INTRAVENOUS
  Filled 2018-03-11: qty 2

## 2018-03-11 MED ORDER — SODIUM CHLORIDE 0.9 % IV SOLN: Freq: Once | INTRAVENOUS | Status: DC

## 2018-03-11 MED ORDER — PREGABALIN 75 MG PO CAPS
600.0000 mg | ORAL_CAPSULE | Freq: Every day | ORAL | Status: DC
  Administered 2018-03-11 – 2018-03-12 (×2): 600 mg via ORAL
  Filled 2018-03-11 (×2): qty 8

## 2018-03-11 MED ORDER — ALBUTEROL SULFATE (2.5 MG/3ML) 0.083% IN NEBU: 2.5000 mg | INHALATION_SOLUTION | RESPIRATORY_TRACT | Status: DC | PRN

## 2018-03-11 MED ORDER — INSULIN ASPART 100 UNIT/ML ~~LOC~~ SOLN: 0.0000 [IU] | Freq: Every day | SUBCUTANEOUS | Status: DC

## 2018-03-11 MED ORDER — ACETAMINOPHEN 500 MG PO TABS: 500.0000 mg | ORAL_TABLET | Freq: Four times a day (QID) | ORAL | Status: DC | PRN

## 2018-03-11 MED ORDER — TOPIRAMATE 25 MG PO TABS
25.0000 mg | ORAL_TABLET | Freq: Two times a day (BID) | ORAL | Status: DC
Start: 2018-03-11 — End: 2018-03-13
  Administered 2018-03-12 – 2018-03-13 (×3): 25 mg via ORAL
  Filled 2018-03-11 (×5): qty 1

## 2018-03-11 MED ORDER — TRAMADOL HCL 50 MG PO TABS
50.0000 mg | ORAL_TABLET | Freq: Four times a day (QID) | ORAL | Status: DC | PRN
  Administered 2018-03-11: 50 mg via ORAL
  Filled 2018-03-11: qty 1

## 2018-03-11 MED ORDER — VANCOMYCIN HCL IN DEXTROSE 1-5 GM/200ML-% IV SOLN
1000.0000 mg | Freq: Two times a day (BID) | INTRAVENOUS | Status: DC
  Administered 2018-03-11 – 2018-03-12 (×2): 1000 mg via INTRAVENOUS
  Filled 2018-03-11 (×3): qty 200

## 2018-03-11 MED ORDER — DOCUSATE SODIUM 100 MG PO CAPS
100.0000 mg | ORAL_CAPSULE | Freq: Two times a day (BID) | ORAL | Status: DC
  Administered 2018-03-11 – 2018-03-13 (×4): 100 mg via ORAL
  Filled 2018-03-11 (×4): qty 1

## 2018-03-11 MED ORDER — INSULIN ASPART 100 UNIT/ML ~~LOC~~ SOLN
0.0000 [IU] | Freq: Three times a day (TID) | SUBCUTANEOUS | Status: DC
  Administered 2018-03-12: 9 [IU] via SUBCUTANEOUS
  Filled 2018-03-11: qty 1

## 2018-03-11 MED ORDER — METOPROLOL TARTRATE 25 MG PO TABS
25.0000 mg | ORAL_TABLET | Freq: Two times a day (BID) | ORAL | Status: DC
  Administered 2018-03-12: 25 mg via ORAL
  Filled 2018-03-11 (×2): qty 1

## 2018-03-11 MED ORDER — VANCOMYCIN HCL IN DEXTROSE 1-5 GM/200ML-% IV SOLN
1000.0000 mg | Freq: Once | INTRAVENOUS | Status: AC
  Administered 2018-03-11: 1000 mg via INTRAVENOUS
  Filled 2018-03-11: qty 200

## 2018-03-11 MED ORDER — ONDANSETRON HCL 4 MG/2ML IJ SOLN: 4.0000 mg | Freq: Four times a day (QID) | INTRAMUSCULAR | Status: DC | PRN

## 2018-03-11 MED ORDER — ONDANSETRON HCL 4 MG PO TABS: 4.0000 mg | ORAL_TABLET | Freq: Four times a day (QID) | ORAL | Status: DC | PRN

## 2018-03-11 MED ORDER — TRAMADOL HCL 50 MG PO TABS: 50.0000 mg | ORAL_TABLET | Freq: Four times a day (QID) | ORAL | 0 refills | Status: DC | PRN

## 2018-03-11 MED ORDER — LIDOCAINE-EPINEPHRINE 1 %-1:100000 IJ SOLN
10.0000 mL | Freq: Once | INTRAMUSCULAR | Status: AC
  Administered 2018-03-11: 10 mL via INTRADERMAL
  Filled 2018-03-11: qty 10

## 2018-03-11 MED ORDER — TOPIRAMATE ER 50 MG PO CAP24: 1.0000 | ORAL_CAPSULE | Freq: Every day | ORAL | Status: DC

## 2018-03-11 MED ORDER — SUMATRIPTAN SUCCINATE 50 MG PO TABS
100.0000 mg | ORAL_TABLET | ORAL | Status: DC | PRN
  Filled 2018-03-11: qty 2

## 2018-03-11 MED ORDER — POLYETHYLENE GLYCOL 3350 17 G PO PACK: 17.0000 g | PACK | Freq: Every day | ORAL | Status: DC | PRN

## 2018-03-11 MED ORDER — ATORVASTATIN CALCIUM 20 MG PO TABS
20.0000 mg | ORAL_TABLET | Freq: Every day | ORAL | Status: DC
  Administered 2018-03-11 – 2018-03-13 (×3): 20 mg via ORAL
  Filled 2018-03-11 (×3): qty 1

## 2018-03-11 MED ORDER — SODIUM CHLORIDE 0.9 % IV SOLN
2.0000 g | Freq: Three times a day (TID) | INTRAVENOUS | Status: DC
  Administered 2018-03-11 – 2018-03-12 (×3): 2 g via INTRAVENOUS
  Filled 2018-03-11 (×4): qty 2

## 2018-03-11 MED ORDER — LIDOCAINE-EPINEPHRINE (PF) 1 %-1:200000 IJ SOLN
INTRAMUSCULAR | Status: AC
  Filled 2018-03-11: qty 30

## 2018-03-11 MED ORDER — SODIUM CHLORIDE 0.9 % IV SOLN
INTRAVENOUS | Status: AC
  Administered 2018-03-11 – 2018-03-12 (×2): via INTRAVENOUS

## 2018-03-11 NOTE — ED Notes (Signed)
Dr Jimmye Norman notified of elevated lactic of 4.6 - no new orders at this time

## 2018-03-11 NOTE — Telephone Encounter (Signed)
Tramadol refills require office visits every 3 months because it is a controlled substance .  Refill for 30 days only

## 2018-03-11 NOTE — Telephone Encounter (Signed)
Last OV 11/22/17 patient in ED now for Lactic acidosis,

## 2018-03-11 NOTE — H&P (Signed)
[facility] [place] NAME: Maria Bentley    MR#:  [id number]  DATE OF BIRTH:  Apr 13, 1971  DATE OF ADMISSION:  03/11/2018  PRIMARY CARE PHYSICIAN: [name], MD   REQUESTING/REFERRING PHYSICIAN: Jimmye Norman  CHIEF COMPLAINT:   Rectal pain HISTORY OF PRESENT ILLNESS:  Maria Bentley  is a 47 y.o. female with a known history of insulin requiring diabetes metas, essential hypertension, migraine, obesity has been not feeling well for the past 1 week and stopped taking her blood pressure medications as well as  long-acting insulin for diabetes.  Patient is presenting with pain in the rectal area.  Diagnosed with rectal abscess which was lanced in the emergency department and patient is started on broad-spectrum IV antibiotics.  Patient was septic with elevated lactic acid level and tachycardia.  Denies any fever  PAST MEDICAL HISTORY:   Past Medical History:  Diagnosis Date  . Anemia   . Anxiety   . Depression 2010  . Diabetes mellitus   . Endometriosis   . Headache, common migraine, intractable   . History of ovarian cyst    left sided, caused by her endometriosis (possibly an endometrioma), in her 38s.  [place] Hyperlipidemia   . Hypertension   . Kidney stones    left  . Morbid obesity ([place])    s/p gastric bypass  . Personal hx of gastric bypass 2010  . Rosacea   . Vertigo     PAST SURGICAL HISTOIRY:   Past Surgical History:  Procedure Laterality Date  . DILATION AND CURETTAGE OF UTERUS  2011  . gastric bypass    . OVARIAN CYST REMOVAL    . ROTATOR CUFF REPAIR     right    SOCIAL HISTORY:   Social History   Tobacco Use  . Smoking status: Former Smoker    Packs/day: 1.00    Years: 4.00    Pack years: 4.00    Types: Cigarettes    Last attempt to quit: 02/05/1996    Years since quitting: 22.1  . Smokeless tobacco: Never Used  Substance Use Topics  . Alcohol use: No    FAMILY HISTORY:   Family History   Problem Relation Age of Onset  . Diabetes Mother   . Hypertension Mother   . Hypertension Father   . Diabetes Father   . Depression Other        strong hx of  . Breast cancer Paternal Grandmother 84  . Cancer Neg Hx     DRUG ALLERGIES:   Allergies  Allergen Reactions  . Avocado Swelling  . Sulfa Drugs Cross Reactors Anaphylaxis  . Adhesive [Tape]   . Amoxicillin Hives  . Clarithromycin   . Honey   . Other Hives and Swelling    All melon  . Watermelon Concentrate     REVIEW OF SYSTEMS:  CONSTITUTIONAL: No fever, fatigue or weakness.  EYES: No blurred or double vision.  EARS, NOSE, AND THROAT: No tinnitus or ear pain.  RESPIRATORY: No cough, shortness of breath, wheezing or hemoptysis.  CARDIOVASCULAR: No chest pain, orthopnea, edema.  GASTROINTESTINAL: No nausea, vomiting, diarrhea or abdominal pain.  GENITOURINARY:   Reporting pain in the rectal area is ENDOCRINE: No polyuria, nocturia,  HEMATOLOGY: No anemia, easy bruising or bleeding SKIN: No rash or lesion. MUSCULOSKELETAL: No joint pain or arthritis.   NEUROLOGIC: No tingling, numbness, weakness.  PSYCHIATRY: No anxiety or depression.   MEDICATIONS AT HOME:   Prior to  Admission medications   Medication Sig Start Date End Date Taking? Authorizing Provider  acetaminophen (TYLENOL) 325 MG tablet Take by mouth.   Yes [provider]  atorvastatin (LIPITOR) 20 MG tablet TAKE 1 TABLET BY MOUTH EVERY DAY 11/27/17  Yes [name], MD  B-D INS SYR MICROFINE 1CC/28G 28G X 1/2" 1 ML MISC USE 1 SYRINGE DAILY AFTER SUPPER 01/10/16  Yes [name], MD  B-D INS SYR MICROFINE 1CC/28G 28G X 1/2" 1 ML MISC USE 1 SYRINGE DAILY AFTER SUPPER 06/07/17  Yes [name], MD  BD INTEGRA SYRINGE 25G X 1" 3 ML MISC FOR USE WITH B12 INJECTIONS WEEKLY/MONTHLY 10/02/16  Yes [name], MD  blood glucose meter kit and supplies KIT Dispense based on patient and insurance preference. Use up to four times daily as  directed. (FOR ICD-E11.21) 08/22/16  Yes [name], MD  buPROPion (WELLBUTRIN XL) 300 MG 24 hr tablet Take 450 mg by mouth daily.    Yes [provider]  cephALEXin (KEFLEX) 500 MG capsule Take 500 mg by mouth 4 (four) times daily.   Yes [provider]  Continuous Blood Gluc Receiver (FREESTYLE LIBRE 14 DAY READER) DEVI 1 applicator by Does not apply route 4 (four) times daily. 11/24/17  Yes [name], MD  Continuous Blood Gluc Sensor (FREESTYLE LIBRE 14 DAY SENSOR) MISC 1 applicator by Does not apply route 4 (four) times daily. 11/24/17  Yes [name], MD  cyanocobalamin (,VITAMIN B-12,) 1000 MCG/ML injection Inject 1 mL (1,000 mcg total) into the muscle once a week. For 4 weeks, then monthly thereafter 11/23/17  Yes [name], MD  EPINEPHrine 0.3 mg/0.3 mL IJ SOAJ injection  06/25/10  Yes [provider]  ergocalciferol (DRISDOL) 50000 units capsule Take 1 capsule (50,000 Units total) by mouth once a week. 11/23/17  Yes [name], MD  escitalopram (LEXAPRO) 20 MG tablet Take 20 mg by mouth.  02/27/17  Yes [provider]  glucose blood test strip Test blood sugar 5 times daily 01/22/18  Yes [name], MD  insulin NPH Human (NOVOLIN N) 100 UNIT/ML injection INJECT 60 TO 70 UNITS SUBCUTANEOUSLY AT BEDTIME Patient taking differently: Inject 60 Units into the skin every evening.  11/26/17  Yes [name], MD  Insulin Pen Needle (PEN NEEDLES) 31G X 6 MM MISC For use with victoza /saxenda 02/21/17  Yes [name], MD  Insulin Syringe-Needle U-100 (B-D INS SYR ULTRAFINE 1CC/31G) 31G X 5/16" 1 ML MISC 1 Syringe by Does not apply route daily. 10/13/13  Yes [name], MD  Lactulose 20 GM/30ML SOLN 30 ml every 4 hours until constipation is relieved 12/27/15  Yes [name], MD  lamoTRIgine (LAMICTAL) 100 MG tablet Take 150 mg by mouth daily.    Yes [provider]  Lancet Device MISC Test blood sugar two times  daily 07/29/12  Yes [name], MD  lidocaine (XYLOCAINE) 2 % jelly Apply 1 application topically as needed. 01/20/18  Yes Rubie Maid, MD  LORazepam (ATIVAN) 0.5 MG tablet Take 0.5-1 mg by mouth daily as needed.    Yes [provider]  meclizine (ANTIVERT) 25 MG tablet Take 1 tablet (25 mg total) by mouth 3 (three) times daily as needed for dizziness. 06/16/15  Yes [name], MD  metFORMIN (GLUCOPHAGE-XR) 500 MG 24 hr tablet Take 1,000 mg by mouth daily with breakfast.   Yes [provider]  NOVOLOG FLEXPEN 100 UNIT/ML FlexPen INJECT 20 UNITS INTO THE SKIN 3 (THREE) TIMES DAILY WITH MEALS. 07/09/17  Yes [name], MD  OnabotulinumtoxinA (BOTOX IJ) Inject as directed every 3 (three) months. For migraine   Yes [provider]  oxyCODONE-acetaminophen (PERCOCET) 10-325 MG tablet Take 1 tablet by mouth every 6 (six) hours as needed for pain.   Yes [provider]  pregabalin (LYRICA) 300 MG capsule Take 600 mg by mouth at bedtime.    Yes [provider]  promethazine (PHENERGAN) 25 MG tablet TAKE 1 TABLET (25 MG TOTAL) BY MOUTH EVERY 8 (EIGHT) HOURS AS NEEDED. 01/04/18  Yes [name], MD  SUMAtriptan (IMITREX) 100 MG tablet TAKE 1 TABLET BY MOUTH EVERY DAY AS NEEDED MAY REPEAT IN 2HRS IF HEADACHE PERSISTS. MAX 2 TABS/24 HR 03/20/17  Yes [name], MD  Topiramate ER (TROKENDI XR) 50 MG CP24 Take 1 capsule by mouth daily.   Yes [provider]  traMADol (ULTRAM) 50 MG tablet TAKE 1 TABLET BY MOUTH EVERY 6 HOURS AS NEEDED 12/25/17  Yes [name], MD  VENTOLIN HFA 108 (90 BASE) MCG/ACT inhaler INHALE ONE PUFF BY MOUTH EVERY SIX HOURS AS NEEDED FOR WHEEZING    Yes [name], MD  ferrous fumarate-iron polysaccharide complex (TANDEM) 162-115.2 MG CAPS capsule Take 1 capsule by mouth daily with breakfast. Patient not taking: Reported on 03/11/2018 11/23/17   [name], MD  fluticasone (FLONASE) 50 MCG/ACT nasal spray  SPRAY 2 SPRAYS INTO EACH NOSTRIL EVERY DAY Patient not taking: Reported on 03/11/2018 03/04/18   [name], MD  metoprolol tartrate (LOPRESSOR) 25 MG tablet Take 1 tablet (25 mg total) by mouth 2 (two) times daily. Patient not taking: Reported on 03/11/2018 11/24/17   [name], MD  nystatin (MYCOSTATIN/NYSTOP) powder APPLY TO AFFECTED AREA TWICE A DAY Patient not taking: Reported on 03/11/2018 02/06/18   [name], MD      VITAL SIGNS:  Blood pressure (!) 137/96, pulse (!) 105, temperature 98.8 F (37.1 C), temperature source Oral, resp. rate 18, height '5\' 4"'$  (1.626 m), weight 117.5 kg (259 lb), last menstrual period 05/09/2016, SpO2 (!) 86 %.  PHYSICAL EXAMINATION:  GENERAL:  47 y.o.-year-old patient lying in the bed with no acute distress.  EYES: Pupils equal, round, reactive to light and accommodation. No scleral icterus. Extraocular muscles intact.  HEENT: Head atraumatic, normocephalic. Oropharynx and nasopharynx clear.  NECK:  Supple, no jugular venous distention. No thyroid enlargement, no tenderness.  LUNGS: Normal breath sounds bilaterally, no wheezing, rales,rhonchi or crepitation. No use of accessory muscles of respiration.  CARDIOVASCULAR: S1, S2 normal. No murmurs, rubs, or gallops.  ABDOMEN: Soft, nontender, nondistended. Bowel sounds present.  Abscess in the perianal area the left side status post I&D  eXTREMITIES: No pedal edema, cyanosis, or clubbing.  NEUROLOGIC: Cranial nerves II through XII are intact. Muscle strength 5/5 in all extremities. Sensation intact. Gait not checked.  PSYCHIATRIC: The patient is alert and oriented x 3.  SKIN: No obvious rash, lesion, or ulcer.   LABORATORY PANEL:   CBC Recent Labs  Lab 03/11/18 1348  WBC 12.4*  HGB 12.4  HCT 41.6  PLT 536*   ------------------------------------------------------------------------------------------------------------------  Chemistries  Recent Labs  Lab 03/11/18 1348  NA 135  K 3.8   CL 96*  CO2 27  GLUCOSE 219*  BUN 16  CREATININE 1.23*  CALCIUM 10.1  AST 30  ALT 20  ALKPHOS 129*  BILITOT 0.5   ------------------------------------------------------------------------------------------------------------------  Cardiac Enzymes No results for input(s): TROPONINI in the last 168 hours. ------------------------------------------------------------------------------------------------------------------  RADIOLOGY:  No results found.  EKG:   Orders placed or performed during the hospital encounter of 03/11/18  . EKG 12-Lead  . EKG 12-Lead    IMPRESSION AND PLAN:     #Sepsis from perirectal abscess Admit to surgery unit Patient met septic criteria at the time of admission with the tachycardia and elevated lactic acid  status post IND in the ED General surgery consulted  broad-spectrum IV antibiotics and wound culture and sensitivity   #AK I IV fluids and avoid nephrotoxins  #Insulin requiring diabetes mellitus patient is not taking Novolin N , will start sliding scale insulin and hold metformin and check hemoglobin A1c  #Essential hypertension; patient is noncompliant with her medications will resume metoprolol and titrate as needed  #Migraine Sumatriptan as needed and continue Topamax    All the records are reviewed and case discussed with ED provider. Management plans discussed with the patient, family and they are in agreement.  CODE STATUS: fc  TOTAL TIME TAKING CARE OF THIS PATIENT: 43 minutes.   Note: This dictation was prepared with Dragon dictation along with smaller phrase technology. Any transcriptional errors that result from this process are unintentional.  Nicholes Mango M.D on 03/11/2018 at 4:46 PM  Between 7am to 6pm - Pager - [phone]  After 6pm go to [email or web address] - password [email or web address] [facility]  Office  [phone]  CC: Primary care physician; [name], MD

## 2018-03-11 NOTE — ED Triage Notes (Signed)
Here for abscess that was drained by dr Marcelline Mates.  Has been on keflex for 21 days.  Has had a new abscess despite being on abx.  Subjective fever and chills.  Reports blood sugar was over 500 this AM, ok at this time.  ST in triage.  Ambulatory without difficulty.

## 2018-03-11 NOTE — ED Triage Notes (Signed)
First Nurse Note:  Arrives stating that blood glucose is elevated today, in the 500's.  AAOx3.  Skin warm and dry. NAD

## 2018-03-11 NOTE — Telephone Encounter (Signed)
Pt  Has  Symptoms   Of   Elevated  Glucose     As   Well   As  As  Being treated   For  abcess  -  Pt    Reports  Feeling    Weak  And  Dizzy  As   Well . Has  Been  Non  Compliant  With  Some  Of  Her  Diabetic  meds . Recent  Stressors  As  Well . Pt advised  To  Go to ER. Have  Someone else  Drive. Reason for Disposition . Blood glucose > 500 mg/dl (27.5 mmol/l)  Answer Assessment - Initial Assessment Questions 1. BLOOD GLUCOSE: "What is your blood glucose level?"        572   2. ONSET: "When did you check the blood glucose?"     1   AND  1/2  HOUR    3. USUAL RANGE: "What is your glucose level usually?" (e.g., usual fasting morning value, usual evening value)      AM  SUGARS  150- 180   PM    80 -110    4. KETONES: "Do you check for ketones (urine or blood test strips)?" If yes, ask: "What does the test show now?"         NO 5. TYPE 1 or 2:  "Do you know what type of diabetes you have?"  (e.g., Type 1, Type 2, Gestational; doesn't know)        Type  2   6. INSULIN: "Do you take insulin?" If yes, ask: "Have you missed any shots recently?"     Yes   7. DIABETES PILLS: "Do you take any pills for your diabetes?" If yes, ask: "Have you missed taking any pills recently?"        Yes   8. OTHER SYMPTOMS: "Do you have any symptoms?" (e.g., fever, frequent urination, difficulty breathing, dizziness, weakness, vomiting)      Being  Treated   For  abcess  At this  Time   Weak  And  Dizzy    9. PREGNANCY: "Is there any chance you are pregnant?" "When was your last menstrual period?"      n/a  Protocols used: DIABETES - HIGH BLOOD SUGAR-A-AH

## 2018-03-11 NOTE — Consult Note (Signed)
Pharmacy Antibiotic Note  Maria Bentley is a 47 y.o. female admitted on 03/11/2018 with sepsis secondary to rectal abscess.  Pharmacy has been consulted for Cefepime and Vancomycin dosing. Patient received vancomycin 1g IV x 1 dose and cefepime 2g IV x 1 dose in ED.   Plan: Ke: 0.064   T1/2: 10.83   Vd: 56   DW: 80kg  Start Vancomycin 1g IV every 12 hours with 6 hour stack dosing. Calculated trough at Css is 16.1. Trough level ordered prior to 4th dose.   Start cefepime 2g IV every 8 hours   Height: 5\' 4"  (162.6 cm) Weight: 259 lb (117.5 kg) IBW/kg (Calculated) : 54.7  Temp (24hrs), Avg:98.6 F (37 C), Min:98.3 F (36.8 C), Max:98.8 F (37.1 C)  Recent Labs  Lab 03/11/18 1348 03/11/18 1618  WBC 12.4*  --   CREATININE 1.23*  --   LATICACIDVEN 4.6* 1.3    Estimated Creatinine Clearance: 71.2 mL/min (A) (by C-G formula based on SCr of 1.23 mg/dL (H)).    Allergies  Allergen Reactions  . Avocado Swelling  . Sulfa Drugs Cross Reactors Anaphylaxis  . Adhesive [Tape]   . Amoxicillin Hives  . Clarithromycin   . Honey   . Other Hives and Swelling    All melon  . Watermelon Concentrate     Antimicrobials this admission: 4/1 cefepime  >>  4/1 Vancomycin >>   Dose adjustments this admission:  Microbiology results: 4/1  BCx: pending 4/1 MRSA PCR: pending   Thank you for allowing pharmacy to be a part of this patient's care.  Pernell Dupre, PharmD, BCPS Clinical Pharmacist 03/11/2018 7:09 PM

## 2018-03-11 NOTE — Progress Notes (Signed)
Anticoagulation monitoring(Lovenox):  47 yo female ordered Lovenox 40 mg Q24h  Filed Weights   03/11/18 1327  Weight: 259 lb (117.5 kg)   BMI 44.4    Lab Results  Component Value Date   CREATININE 1.23 (H) 03/11/2018   CREATININE 1.50 (H) 11/22/2017   CREATININE 1.18 02/21/2017   Estimated Creatinine Clearance: 71.2 mL/min (A) (by C-G formula based on SCr of 1.23 mg/dL (H)). Hemoglobin & Hematocrit     Component Value Date/Time   HGB 12.4 03/11/2018 1348   HCT 41.6 03/11/2018 1348     Per Protocol for Patient with estCrcl > 30 ml/min and BMI > 40, will transition to Lovenox 40 mg Q12h.

## 2018-03-11 NOTE — Care Management (Signed)
Follow up appointment made with Dr. [name] for 03/25/18 at 1045AM.  She was last seen in Dec. 13 2018 with Dr. [name].

## 2018-03-11 NOTE — ED Notes (Signed)
All labs sent and urine

## 2018-03-11 NOTE — Telephone Encounter (Addendum)
Patient stated she is going to ED as soon as her husband arrives home with in the next hour. Advised patient she should go soon as possible. CBG of 572

## 2018-03-11 NOTE — Consult Note (Signed)
SURGICAL CONSULTATION NOTE   HISTORY OF PRESENT ILLNESS (HPI):  47 y.o. female presented to [facility] ED for evaluation of peri anal abscess. Patient reports that she has had multiple perineal and peri anal abscess since February 2019. At that time, Dr. Marcelline Mates drained the abscesses. Since then, it has been improving and recurring being on multiple courses of oral antibiotics (on and off Keflex). Currently, patient has been on Keflex since a week ago due to perineal abscess. Last week also developed a Bronchitis. Two days ago developed a new abscess on the left buttock that rapidly progressed wit induration and pain. Today patient checked her blood sugar at home and it was >500 and that what made her come to the ED for evaluation of both, the sugar and the prgression of the abscess even being on antibiotic therapy. Denies fever or chills.   Patient refers that the abscess was drained at the ER and since then she has been feeling better with significant improve of pain.  Surgery is consulted by Dr. Margaretmary Eddy in this context for evaluation and management of perianal abscess.  PAST MEDICAL HISTORY (PMH):  Past Medical History:  Diagnosis Date  . Anemia   . Anxiety   . Depression 2010  . Diabetes mellitus   . Endometriosis   . Headache, common migraine, intractable   . History of ovarian cyst    left sided, caused by her endometriosis (possibly an endometrioma), in her 48s.  Marland Kitchen Hyperlipidemia   . Hypertension   . Kidney stones    left  . Morbid obesity ([place])    s/p gastric bypass  . Personal hx of gastric bypass 2010  . Rosacea   . Vertigo      PAST SURGICAL HISTORY ([place]):  Past Surgical History:  Procedure Laterality Date  . DILATION AND CURETTAGE OF UTERUS  2011  . gastric bypass    . OVARIAN CYST REMOVAL    . ROTATOR CUFF REPAIR     right     MEDICATIONS:  Prior to Admission medications   Medication Sig Start Date End Date Taking? Authorizing Provider  acetaminophen (TYLENOL) 325  MG tablet Take by mouth.   Yes [provider]  atorvastatin (LIPITOR) 20 MG tablet TAKE 1 TABLET BY MOUTH EVERY DAY 11/27/17  Yes [name], MD  B-D INS SYR MICROFINE 1CC/28G 28G X 1/2" 1 ML MISC USE 1 SYRINGE DAILY AFTER SUPPER 01/10/16  Yes [name], MD  B-D INS SYR MICROFINE 1CC/28G 28G X 1/2" 1 ML MISC USE 1 SYRINGE DAILY AFTER SUPPER 06/07/17  Yes [name], MD  BD INTEGRA SYRINGE 25G X 1" 3 ML MISC FOR USE WITH B12 INJECTIONS WEEKLY/MONTHLY 10/02/16  Yes [name], MD  blood glucose meter kit and supplies KIT Dispense based on patient and insurance preference. Use up to four times daily as directed. (FOR ICD-E11.21) 08/22/16  Yes [name], MD  buPROPion (WELLBUTRIN XL) 300 MG 24 hr tablet Take 450 mg by mouth daily.    Yes [provider]  cephALEXin (KEFLEX) 500 MG capsule Take 500 mg by mouth 4 (four) times daily.   Yes [provider]  Continuous Blood Gluc Receiver (FREESTYLE LIBRE 14 DAY READER) DEVI 1 applicator by Does not apply route 4 (four) times daily. 11/24/17  Yes [name], MD  Continuous Blood Gluc Sensor (FREESTYLE LIBRE 14 DAY SENSOR) MISC 1 applicator by Does not apply route 4 (four) times daily. 11/24/17  Yes [name], MD  cyanocobalamin (,VITAMIN B-12,) 1000 MCG/ML injection Inject 1 mL (1,000 mcg total) into the muscle once a week. For 4 weeks, then monthly thereafter 11/23/17  Yes [name], MD  EPINEPHrine 0.3 mg/0.3 mL IJ SOAJ injection  06/25/10  Yes [provider]  ergocalciferol (DRISDOL) 50000 units capsule Take 1 capsule (50,000 Units total) by mouth once a week. 11/23/17  Yes [name], MD  escitalopram (LEXAPRO) 20 MG tablet Take 20 mg by mouth.  02/27/17  Yes [provider]  glucose blood test strip Test blood sugar 5 times daily 01/22/18  Yes [name], MD  insulin NPH Human (NOVOLIN N) 100 UNIT/ML injection INJECT 60 TO 70 UNITS SUBCUTANEOUSLY AT  BEDTIME Patient taking differently: Inject 60 Units into the skin every evening.  11/26/17  Yes [name], MD  Insulin Pen Needle (PEN NEEDLES) 31G X 6 MM MISC For use with victoza /saxenda 02/21/17  Yes [name], MD  Insulin Syringe-Needle U-100 (B-D INS SYR ULTRAFINE 1CC/31G) 31G X 5/16" 1 ML MISC 1 Syringe by Does not apply route daily. 10/13/13  Yes [name], MD  Lactulose 20 GM/30ML SOLN 30 ml every 4 hours until constipation is relieved 12/27/15  Yes [name], MD  lamoTRIgine (LAMICTAL) 100 MG tablet Take 150 mg by mouth daily.    Yes [provider]  Lancet Device MISC Test blood sugar two times daily 07/29/12  Yes [name], MD  lidocaine (XYLOCAINE) 2 % jelly Apply 1 application topically as needed. 01/20/18  Yes Rubie Maid, MD  LORazepam (ATIVAN) 0.5 MG tablet Take 0.5-1 mg by mouth daily as needed.    Yes [provider]  meclizine (ANTIVERT) 25 MG tablet Take 1 tablet (25 mg total) by mouth 3 (three) times daily as needed for dizziness. 06/16/15  Yes [name], MD  metFORMIN (GLUCOPHAGE-XR) 500 MG 24 hr tablet Take 1,000 mg by mouth daily with breakfast.   Yes [provider]  NOVOLOG FLEXPEN 100 UNIT/ML FlexPen INJECT 20 UNITS INTO THE SKIN 3 (THREE) TIMES DAILY WITH MEALS. 07/09/17  Yes [name], MD  OnabotulinumtoxinA (BOTOX IJ) Inject as directed every 3 (three) months. For migraine   Yes [provider]  oxyCODONE-acetaminophen (PERCOCET) 10-325 MG tablet Take 1 tablet by mouth every 6 (six) hours as needed for pain.   Yes [provider]  pregabalin (LYRICA) 300 MG capsule Take 600 mg by mouth at bedtime.    Yes [provider]  promethazine (PHENERGAN) 25 MG tablet TAKE 1 TABLET (25 MG TOTAL) BY MOUTH EVERY 8 (EIGHT) HOURS AS NEEDED. 01/04/18  Yes [name], MD  SUMAtriptan (IMITREX) 100 MG tablet TAKE 1 TABLET BY MOUTH EVERY DAY AS NEEDED MAY REPEAT IN 2HRS IF HEADACHE PERSISTS. MAX  2 TABS/24 HR 03/20/17  Yes [name], MD  Topiramate ER (TROKENDI XR) 50 MG CP24 Take 1 capsule by mouth daily.   Yes [provider]  VENTOLIN HFA 108 (90 BASE) MCG/ACT inhaler INHALE ONE PUFF BY MOUTH EVERY SIX HOURS AS NEEDED FOR WHEEZING    Yes [name], MD  ferrous fumarate-iron polysaccharide complex (TANDEM) 162-115.2 MG CAPS capsule Take 1 capsule by mouth daily with breakfast. Patient not taking: Reported on 03/11/2018 11/23/17   [name], MD  fluticasone (FLONASE) 50 MCG/ACT nasal spray SPRAY 2 SPRAYS INTO EACH NOSTRIL EVERY DAY Patient not taking: Reported on 03/11/2018 03/04/18  [name], MD  metoprolol tartrate (LOPRESSOR) 25 MG tablet Take 1 tablet (25 mg total) by mouth 2 (two) times daily. Patient not taking: Reported on 03/11/2018 11/24/17   [name], MD  nystatin (MYCOSTATIN/NYSTOP) powder APPLY TO AFFECTED AREA TWICE A DAY Patient not taking: Reported on 03/11/2018 02/06/18   [name], MD  traMADol (ULTRAM) 50 MG tablet Take 1 tablet (50 mg total) by mouth every 6 (six) hours as needed. 03/11/18   [name], MD     ALLERGIES:  Allergies  Allergen Reactions  . Avocado Swelling  . Sulfa Drugs Cross Reactors Anaphylaxis  . Adhesive [Tape]   . Amoxicillin Hives  . Clarithromycin   . Honey   . Other Hives and Swelling    All melon  . Watermelon Concentrate      SOCIAL HISTORY:  Social History   Socioeconomic History  . Marital status: Married    Spouse name: Not on file  . Number of children: Not on file  . Years of education: Not on file  . Highest education level: Not on file  Occupational History  . Not on file  Social Needs  . Financial resource strain: Not on file  . Food insecurity:    Worry: Not on file    Inability: Not on file  . Transportation needs:    Medical: Not on file    Non-medical: Not on file  Tobacco Use  . Smoking status: Former Smoker    Packs/day: 1.00    Years: 4.00    Pack years: 4.00     Types: Cigarettes    Last attempt to quit: 02/05/1996    Years since quitting: 22.1  . Smokeless tobacco: Never Used  Substance and Sexual Activity  . Alcohol use: No  . Drug use: No  . Sexual activity: Yes    Birth control/protection: None  Lifestyle  . Physical activity:    Days per week: Not on file    Minutes per session: Not on file  . Stress: Not on file  Relationships  . Social connections:    Talks on phone: Not on file    Gets together: Not on file    Attends religious service: Not on file    Active member of club or organization: Not on file    Attends meetings of clubs or organizations: Not on file    Relationship status: Not on file  . Intimate partner violence:    Fear of current or ex partner: Not on file    Emotionally abused: Not on file    Physically abused: Not on file    Forced sexual activity: Not on file  Other Topics Concern  . Not on file  Social History Narrative  . Not on file    The patient currently resides (home / rehab facility / nursing home): Home The patient normally is (ambulatory / bedbound): Ambulatory   FAMILY HISTORY:  Family History  Problem Relation Age of Onset  . Diabetes Mother   . Hypertension Mother   . Hypertension Father   . Diabetes Father   . Depression Other        strong hx of  . Breast cancer Paternal Grandmother 70  . Cancer Neg Hx      REVIEW OF SYSTEMS:  Constitutional: denies weight loss, fever, chills, or sweats  Eyes: denies any other vision changes, history of eye injury  ENT: denies sore throat, hearing problems  Respiratory: denies shortness of  breath, wheezing. Positive for coughing last week  Cardiovascular: denies chest pain, palpitations  Gastrointestinal: denies abdominal pain, N/V, Positive for recent diarrhea Genitourinary: denies burning with urination or urinary frequency Musculoskeletal: denies any other joint pains or cramps  Skin: denies any other rashes or skin discolorations. Positive  for multiple abscess on perineal area. Neurological: denies any other headache, dizziness, weakness  Psychiatric: denies any other depression, anxiety   All other review of systems were negative   VITAL SIGNS:  Temp:  [98.3 F (36.8 C)-98.8 F (37.1 C)] 98.3 F (36.8 C) (04/01 1804) Pulse Rate:  [105-130] 116 (04/01 1804) Resp:  [14-27] 14 (04/01 1804) BP: (127-165)/(70-99) 136/70 (04/01 1804) SpO2:  [86 %-97 %] 97 % (04/01 1804) Weight:  [117.5 kg (259 lb)] 117.5 kg (259 lb) (04/01 1327)     Height: '5\' 4"'$  (162.6 cm) Weight: 117.5 kg (259 lb) BMI (Calculated): 44.44   PHYSICAL EXAM:  Constitutional:  -- Obese  -- Awake, alert, and oriented x3  Eyes:  -- Pupils equally round and reactive to light  -- No scleral icterus  Ear, nose, and throat:  -- No jugular venous distension  Pulmonary:  -- No crackles  -- Equal breath sounds bilaterally -- Breathing non-labored at rest Cardiovascular:  -- Regular rhythm, tachycardia -- No pericardial rubs Gastrointestinal:  -- Abdomen soft, nontender, non-distended, no guarding or rebound tenderness -- No abdominal masses appreciated, pulsatile or otherwise  Musculoskeletal and Integumentary:  -- Left buttock abscess with small wound, no fluctuant collection. Associated cellulitis, no blanching erythema. No other abscess on perineal area currently.  -- Extremities: no edema or cyanosis Neurologic:  -- Motor function: intact and symmetric -- Sensation: intact and symmetric   Labs:  CBC Latest Ref Rng & Units 03/11/2018 11/22/2017 08/25/2015  WBC 3.6 - 11.0 K/uL 12.4(H) 10.2 8.3  Hemoglobin 12.0 - 16.0 g/dL 12.4 11.6(L) 11.8(L)  Hematocrit 35.0 - 47.0 % 41.6 40.2 37.7  Platelets 150 - 440 K/uL 536(H) 659.0(H) 527.0(H)   CMP Latest Ref Rng & Units 03/11/2018 11/22/2017 02/21/2017  Glucose 65 - 99 mg/dL 219(H) 83 33(LL)  BUN 6 - 20 mg/dL 16 22 25(H)  Creatinine 0.44 - 1.00 mg/dL 1.23(H) 1.50(H) 1.18  Sodium 135 - 145 mmol/L 135 140 141   Potassium 3.5 - 5.1 mmol/L 3.8 4.6 4.3  Chloride 101 - 111 mmol/L 96(L) 101 100  CO2 22 - 32 mmol/L 27 32 32  Calcium 8.9 - 10.3 mg/dL 10.1 9.2 10.3  Total Protein 6.5 - 8.1 g/dL 8.5(H) 6.4 7.3  Total Bilirubin 0.3 - 1.2 mg/dL 0.5 0.2 0.3  Alkaline Phos 38 - 126 U/L 129(H) 113 111  AST 15 - 41 U/L '30 12 11  '$ ALT 14 - 54 U/L '20 17 14   '$ Imaging studies:  No recent images study   Assessment/Plan:  47 y.o. female with left buttock abscess status post incision and drainage. Also with uncontrolled diabetes. Currently patient with multiple recurrent abscess. Only culture with strange growth of Raoultella planticola on ly resistant to Ampicillin. No drainage to culture during my evaluation. May consider treating for MRSA. Agree with current antibiotic therapy. Adequately controlling glucose gradually. Will follow clinically for progression of abscess, also with labs and with control of pain and glucose. If recurs or not improving may consider imaging study to evaluate if there is deeper collection or any residual undrained abscess. Will follow with you.   Arnold Long, MD

## 2018-03-11 NOTE — ED Provider Notes (Signed)
[facility] Emergency Department Provider Note       Time seen: ----------------------------------------- 2:08 PM on 03/11/2018 -----------------------------------------   I have reviewed the triage vital signs and the nursing notes.  HISTORY   Chief Complaint Hyperglycemia    HPI Maria Bentley is a 47 y.o. female with a history of anemia, depression, diabetes, hyperlipidemia, hypertension and morbid obesity who presents to the ED for hyperglycemia today which was reportedly in the 500s.  She was also presenting for an abscess that was recently drained by Dr. Marcelline Mates.  She has been on Keflex for 21 days and she thinks she has a new abscess on her buttocks.  She has had subjective fever and chills.  She denies any other complaints at this time.  Past Medical History:  Diagnosis Date  . Anemia   . Anxiety   . Depression 2010  . Diabetes mellitus   . Endometriosis   . Headache, common migraine, intractable   . History of ovarian cyst    left sided, caused by her endometriosis (possibly an endometrioma), in her 59s.  Marland Kitchen Hyperlipidemia   . Hypertension   . Kidney stones    left  . Morbid obesity ([place])    s/p gastric bypass  . Personal hx of gastric bypass 2010  . Rosacea   . Vertigo     Patient Active Problem List   Diagnosis Date Noted  . Paronychia of great toe of left foot 11/24/2017  . Hypertension 11/24/2017  . Pelvic mass in female 04/05/2017  . Tendonitis 03/30/2016  . Left ankle sprain 03/30/2016  . Peroneal tendonitis 01/13/2016  . Hip pain, chronic 12/28/2015  . Pain in joint, ankle and foot 12/28/2015  . Constipation 12/27/2015  . 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 Noncompliance with diabetes treatment 06/08/2015  . Right hip pain 05/30/2014  . Vitamin D deficiency 05/30/2014  . Tachycardia 08/15/2013  . Diabetes mellitus type 2, uncontrolled ([place]) 08/15/2013  . Palpitations 07/15/2013  . Dyspnea  07/15/2013  . Morbid obesity ([place]) 06/09/2012  . Anxiety   . Headache, common migraine, intractable   . Hyperlipidemia   . OSA (obstructive sleep apnea) 02/05/2012    Past Surgical History:  Procedure Laterality Date  . DILATION AND CURETTAGE OF UTERUS  2011  . gastric bypass    . OVARIAN CYST REMOVAL    . ROTATOR CUFF REPAIR     right    Allergies Avocado; Sulfa drugs cross reactors; Adhesive [tape]; Amoxicillin; Clarithromycin; Honey; Other; and Watermelon concentrate  Social History Social History   Tobacco Use  . Smoking status: Former Smoker    Packs/day: 1.00    Years: 4.00    Pack years: 4.00    Types: Cigarettes    Last attempt to quit: 02/05/1996    Years since quitting: 22.1  . Smokeless tobacco: Never Used  Substance Use Topics  . Alcohol use: No  . Drug use: No   Review of Systems Constitutional: Positive for fever and chills Cardiovascular: Negative for chest pain. Respiratory: Negative for shortness of breath. Gastrointestinal: Negative for abdominal pain, vomiting and diarrhea. Musculoskeletal: Negative for back pain. Skin: Positive for skin abscess Neurological: Negative for headaches, focal weakness or numbness.  All systems negative/normal/unremarkable except as stated in the HPI  ____________________________________________   PHYSICAL EXAM:  VITAL SIGNS: ED Triage Vitals  Enc Vitals Group     BP 03/11/18 1326 (!) 165/83     Pulse Rate 03/11/18 1326 (!) 130  Resp 03/11/18 1326 20     Temp 03/11/18 1326 98.8 F (37.1 C)     Temp Source 03/11/18 1326 Oral     SpO2 03/11/18 1326 96 %     Weight 03/11/18 1327 259 lb (117.5 kg)     Height 03/11/18 1327 5\' 4"  (1.626 m)     Head Circumference --      Peak Flow --      Pain Score 03/11/18 1338 4     Pain Loc --      Pain Edu? --      Excl. in [place]? --    Constitutional: Alert and oriented. Well appearing and in no distress. Eyes: Conjunctivae are normal. Normal extraocular  movements. ENT   Head: Normocephalic and atraumatic.   Nose: No congestion/rhinnorhea.   Mouth/Throat: Mucous membranes are moist.   Neck: No stridor. Cardiovascular: Normal rate, regular rhythm. No murmurs, rubs, or gallops. Respiratory: Normal respiratory effort without tachypnea nor retractions. Breath sounds are clear and equal bilaterally. No wheezes/rales/rhonchi. Gastrointestinal: Soft and nontender. Normal bowel sounds Musculoskeletal: Nontender with normal range of motion in extremities. No lower extremity tenderness nor edema. Neurologic:  Normal speech and language. No gross focal neurologic deficits are appreciated.  Skin: Skin abscesses noted Psychiatric: Mood and affect are normal. Speech and behavior are normal.  ____________________________________________  ED COURSE:  As part of my medical decision making, I reviewed the following data within the [place] History obtained from family if available, nursing notes, old chart and ekg, as well as notes from prior ED visits. Patient presented for hyperglycemia and possible abscess, we will assess with labs as indicated at this time.   Marland Kitchen.Incision and Drainage Date/Time: 03/11/2018 2:43 PM Performed by: Earleen Newport, MD Authorized by: Earleen Newport, MD   Consent:    Consent obtained:  Verbal   Consent given by:  Patient   Risks discussed:  Bleeding, infection, incomplete drainage and pain   Alternatives discussed:  Alternative treatment, delayed treatment and observation Location:    Type:  Abscess   Location:  Anogenital   Anogenital location:  Gluteal cleft Pre-procedure details:    Skin preparation:  Betadine Anesthesia (see MAR for exact dosages):    Anesthesia method:  Local infiltration   Local anesthetic:  Lidocaine 1% WITH epi Procedure type:    Complexity:  Simple Procedure details:    Incision types:  Single straight   Incision depth:  Subcutaneous   Scalpel  blade:  11   Wound management:  Probed and deloculated   Drainage:  Purulent   Drainage amount:  Scant   Wound treatment:  Wound left open   Packing materials:  None Post-procedure details:    Patient tolerance of procedure:  Tolerated well, no immediate complications   ____________________________________________   LABS (pertinent positives/negatives)  Labs Reviewed  GLUCOSE, CAPILLARY - Abnormal; Notable for the following components:      Result Value   Glucose-Capillary 241 (*)    All other components within normal limits  LACTIC ACID, PLASMA - Abnormal; Notable for the following components:   Lactic Acid, Venous 4.6 (*)    All other components within normal limits  COMPREHENSIVE METABOLIC PANEL - Abnormal; Notable for the following components:   Chloride 96 (*)    Glucose, Bld 219 (*)    Creatinine, Ser 1.23 (*)    Total Protein 8.5 (*)    Alkaline Phosphatase 129 (*)    GFR calc non Af Amer 51 (*)  GFR calc Af Amer 60 (*)    All other components within normal limits  CBC WITH DIFFERENTIAL/PLATELET - Abnormal; Notable for the following components:   WBC 12.4 (*)    RBC 5.25 (*)    MCV 79.2 (*)    MCH 23.7 (*)    MCHC 29.9 (*)    RDW 17.5 (*)    Platelets 536 (*)    Neutro Abs 8.6 (*)    All other components within normal limits  URINALYSIS, COMPLETE (UACMP) WITH MICROSCOPIC - Abnormal; Notable for the following components:   Color, Urine YELLOW (*)    APPearance HAZY (*)    Glucose, UA >=500 (*)    Hgb urine dipstick SMALL (*)    Ketones, ur 20 (*)    Bacteria, UA MANY (*)    Squamous Epithelial / LPF 0-5 (*)    All other components within normal limits  CULTURE, BLOOD (ROUTINE X 2)  CULTURE, BLOOD (ROUTINE X 2)  LACTIC ACID, PLASMA   ____________________________________________  DIFFERENTIAL DIAGNOSIS   DKA, hyperglycemia, dehydration, anxiety, sepsis, cellulitis, abscess  FINAL ASSESSMENT AND PLAN  Hyperglycemia, abscess, possible  sepsis   Plan: The patient had presented for high blood sugars and skin abscesses. Patient's labs revealed mild leukocytosis but marketed lactic acid elevation.  Initially I believed her lactic acid level to be elevated secondary to taking metformin but patient states she has not had it in the last week.  I have ordered broad-spectrum antibiotics for her lactic acidosis but she looks remarkably well.  She is stable for admission at this time.   Laurence Aly, MD   Note: This note was generated in part or whole with voice recognition software. Voice recognition is usually quite accurate but there are transcription errors that can and very often do occur. I apologize for any typographical errors that were not detected and corrected.     Earleen Newport, MD 03/11/18 [phone]

## 2018-03-11 NOTE — ED Notes (Signed)
Pt taken to floor via stretcher. VSS. NAD. Husband with pt. Report called to [facility]. All questions answered.

## 2018-03-12 DIAGNOSIS — L0291 Cutaneous abscess, unspecified: Secondary | ICD-10-CM

## 2018-03-12 LAB — CBC WITH DIFFERENTIAL/PLATELET
BASOS ABS: 0 10*3/uL (ref 0–0.1)
BASOS PCT: 0 %
EOS ABS: 0.1 10*3/uL (ref 0–0.7)
EOS PCT: 1 %
HCT: 36.4 % (ref 35.0–47.0)
Hemoglobin: 11.2 g/dL — ABNORMAL LOW (ref 12.0–16.0)
LYMPHS PCT: 22 %
Lymphs Abs: 1.5 10*3/uL (ref 1.0–3.6)
MCH: 24.7 pg — ABNORMAL LOW (ref 26.0–34.0)
MCHC: 30.7 g/dL — ABNORMAL LOW (ref 32.0–36.0)
MCV: 80.4 fL (ref 80.0–100.0)
MONO ABS: 0.4 10*3/uL (ref 0.2–0.9)
Monocytes Relative: 7 %
Neutro Abs: 4.8 10*3/uL (ref 1.4–6.5)
Neutrophils Relative %: 70 %
PLATELETS: 372 10*3/uL (ref 150–440)
RBC: 4.53 MIL/uL (ref 3.80–5.20)
RDW: 17.9 % — AB (ref 11.5–14.5)
WBC: 6.9 10*3/uL (ref 3.6–11.0)

## 2018-03-12 LAB — COMPREHENSIVE METABOLIC PANEL
ALK PHOS: 110 U/L (ref 38–126)
ALT: 16 U/L (ref 14–54)
AST: 12 U/L — ABNORMAL LOW (ref 15–41)
Albumin: 3.1 g/dL — ABNORMAL LOW (ref 3.5–5.0)
Anion gap: 9 (ref 5–15)
BUN: 16 mg/dL (ref 6–20)
CALCIUM: 8.7 mg/dL — AB (ref 8.9–10.3)
CO2: 26 mmol/L (ref 22–32)
CREATININE: 1.38 mg/dL — AB (ref 0.44–1.00)
Chloride: 99 mmol/L — ABNORMAL LOW (ref 101–111)
GFR calc non Af Amer: 45 mL/min — ABNORMAL LOW (ref >60)
GFR, EST AFRICAN AMERICAN: 52 mL/min — AB (ref >60)
Glucose, Bld: 487 mg/dL — ABNORMAL HIGH (ref 65–99)
Potassium: 4.3 mmol/L (ref 3.5–5.1)
Sodium: 134 mmol/L — ABNORMAL LOW (ref 135–145)
Total Bilirubin: 0.8 mg/dL (ref 0.3–1.2)
Total Protein: 6.9 g/dL (ref 6.5–8.1)

## 2018-03-12 LAB — GLUCOSE, CAPILLARY
GLUCOSE-CAPILLARY: 446 mg/dL — AB (ref 65–99)
GLUCOSE-CAPILLARY: 164 mg/dL — AB (ref 65–99)
Glucose-Capillary: 410 mg/dL — ABNORMAL HIGH (ref 65–99)
Glucose-Capillary: 179 mg/dL — ABNORMAL HIGH (ref 65–99)

## 2018-03-12 MED ORDER — INSULIN DETEMIR 100 UNIT/ML ~~LOC~~ SOLN
45.0000 [IU] | Freq: Every day | SUBCUTANEOUS | Status: DC
  Administered 2018-03-12 – 2018-03-13 (×2): 45 [IU] via SUBCUTANEOUS
  Filled 2018-03-12 (×2): qty 0.45

## 2018-03-12 MED ORDER — INSULIN ASPART 100 UNIT/ML ~~LOC~~ SOLN
0.0000 [IU] | Freq: Three times a day (TID) | SUBCUTANEOUS | Status: DC
  Administered 2018-03-12: 20 [IU] via SUBCUTANEOUS
  Administered 2018-03-12: 4 [IU] via SUBCUTANEOUS
  Administered 2018-03-13 (×2): 20 [IU] via SUBCUTANEOUS
  Filled 2018-03-12 (×5): qty 1

## 2018-03-12 MED ORDER — INSULIN ASPART 100 UNIT/ML ~~LOC~~ SOLN
10.0000 [IU] | Freq: Three times a day (TID) | SUBCUTANEOUS | Status: DC
  Administered 2018-03-12 – 2018-03-13 (×4): 10 [IU] via SUBCUTANEOUS
  Filled 2018-03-12 (×4): qty 1

## 2018-03-12 MED ORDER — METRONIDAZOLE 500 MG PO TABS
500.0000 mg | ORAL_TABLET | Freq: Three times a day (TID) | ORAL | Status: DC
  Administered 2018-03-12 – 2018-03-13 (×2): 500 mg via ORAL
  Filled 2018-03-12 (×4): qty 1

## 2018-03-12 MED ORDER — ESCITALOPRAM OXALATE 20 MG PO TABS
20.0000 mg | ORAL_TABLET | Freq: Every day | ORAL | Status: DC
  Administered 2018-03-12 – 2018-03-13 (×2): 20 mg via ORAL
  Filled 2018-03-12 (×2): qty 1

## 2018-03-12 MED ORDER — INSULIN DETEMIR 100 UNIT/ML ~~LOC~~ SOLN
45.0000 [IU] | Freq: Every day | SUBCUTANEOUS | Status: DC
  Filled 2018-03-12: qty 0.45

## 2018-03-12 MED ORDER — INSULIN ASPART 100 UNIT/ML ~~LOC~~ SOLN: 0.0000 [IU] | Freq: Every day | SUBCUTANEOUS | Status: DC

## 2018-03-12 MED ORDER — CIPROFLOXACIN HCL 500 MG PO TABS
500.0000 mg | ORAL_TABLET | Freq: Two times a day (BID) | ORAL | Status: DC
  Administered 2018-03-12 – 2018-03-13 (×2): 500 mg via ORAL
  Filled 2018-03-12 (×2): qty 1

## 2018-03-12 MED ORDER — BUPROPION HCL ER (XL) 150 MG PO TB24
450.0000 mg | ORAL_TABLET | Freq: Every day | ORAL | Status: DC
  Administered 2018-03-12 – 2018-03-13 (×2): 450 mg via ORAL
  Filled 2018-03-12 (×2): qty 3

## 2018-03-12 MED ORDER — LAMOTRIGINE 25 MG PO TABS
150.0000 mg | ORAL_TABLET | Freq: Every day | ORAL | Status: DC
  Administered 2018-03-12 – 2018-03-13 (×2): 150 mg via ORAL
  Filled 2018-03-12 (×2): qty 1

## 2018-03-12 NOTE — Telephone Encounter (Signed)
Printed, signed and faxed.  

## 2018-03-12 NOTE — Progress Notes (Signed)
Md paged regarding requests for her to resume her home medications. Will order normal daily medications. Also, FSBS has been critically high and pt is on sensitive sliding scale. Will place orders to increase to a more aggressive coverage option.

## 2018-03-12 NOTE — Progress Notes (Addendum)
[place] at [place] NAME: Maria Bentley    MR#:  [id number]  DATE OF BIRTH:  [birth year]  SUBJECTIVE:  CHIEF COMPLAINT:   Chief Complaint  Patient presents with  . Hyperglycemia   The patient has no complaints. REVIEW OF SYSTEMS:  Review of Systems  Constitutional: Negative for chills, fever and malaise/fatigue.  HENT: Negative for sore throat.   Eyes: Negative for blurred vision and double vision.  Respiratory: Negative for cough, hemoptysis, shortness of breath, wheezing and stridor.   Cardiovascular: Negative for chest pain, palpitations, orthopnea and leg swelling.  Gastrointestinal: Negative for abdominal pain, blood in stool, diarrhea, melena, nausea and vomiting.  Genitourinary: Negative for dysuria, flank pain and hematuria.  Musculoskeletal: Negative for back pain and joint pain.  Neurological: Negative for dizziness, sensory change, focal weakness, seizures, loss of consciousness, weakness and headaches.  Endo/Heme/Allergies: Negative for polydipsia.  Psychiatric/Behavioral: Negative for depression. The patient is not nervous/anxious.     DRUG ALLERGIES:   Allergies  Allergen Reactions  . Avocado Swelling  . Sulfa Drugs Cross Reactors Anaphylaxis  . Adhesive [Tape]   . Amoxicillin Hives  . Clarithromycin   . Honey   . Other Hives and Swelling    All melon  . Watermelon Concentrate    VITALS:  Blood pressure (!) 112/58, pulse (!) 102, temperature 98.4 F (36.9 C), temperature source Oral, resp. rate 18, height _0  (1.626 m), weight 259 lb (117.5 kg), last menstrual period 05/09/2016, SpO2 94 %. PHYSICAL EXAMINATION:  Physical Exam  Constitutional: She is oriented to person, place, and time and well-developed, well-nourished, and in no distress.  Morbid obesity.  HENT:  Head: Normocephalic.  Mouth/Throat: Oropharynx is clear and moist.  Eyes: Pupils are equal, round, and reactive to light. Conjunctivae and  EOM are normal. No scleral icterus.  Neck: Normal range of motion. Neck supple. No JVD present. No tracheal deviation present.  Cardiovascular: Normal rate, regular rhythm and normal heart sounds. Exam reveals no gallop.  No murmur heard. Pulmonary/Chest: Effort normal and breath sounds normal. No respiratory distress. She has no wheezes. She has no rales.  Abdominal: Soft. Bowel sounds are normal. She exhibits no distension. There is no tenderness. There is no rebound.  Musculoskeletal: Normal range of motion. She exhibits no edema or tenderness.  Neurological: She is alert and oriented to person, place, and time. No cranial nerve deficit.  Skin: No rash noted. No erythema.  Psychiatric: Affect normal.   LABORATORY PANEL:  Female CBC Recent Labs  Lab 03/12/18 0458  WBC 6.9  HGB 11.2*  HCT 36.4  PLT 372   ------------------------------------------------------------------------------------------------------------------ Chemistries  Recent Labs  Lab 03/12/18 0422  NA 134*  K 4.3  CL 99*  CO2 26  GLUCOSE 487*  BUN 16  CREATININE 1.38*  CALCIUM 8.7*  AST 12*  ALT 16  ALKPHOS 110  BILITOT 0.8   RADIOLOGY:  No results found. ASSESSMENT AND PLAN:   #Sepsis from perirectal abscess Patient met septic criteria at the time of admission with the tachycardia and elevated lactic acid  status post I&D in the ED Continue cefepime and follow-up wound culture. Changed to Cipro and Flagyl p.o. for 10 days after discharge per Dr. Dahlia Byes.  #AK I, worsening creatinine, continue IV fluids and follow BMP.  #Hyperglycemia with insulin requiring diabetes mellitus  Start Levemir 45 units, NovoLog 10 units before meals and continue sliding scale insulin and hold metformin. F/u Hemoglobin A1c  #Essential  hypertension; patient is noncompliant with her medications Resumed metoprolol and titrate as needed  #Migraine Sumatriptan as needed and continue Topamax  Morbid obesity.  All the  records are reviewed and case discussed with Care Management/Social Worker. Management plans discussed with the patient, her husband and they are in agreement.  CODE STATUS: Full Code  TOTAL TIME TAKING CARE OF THIS PATIENT: 36 minutes.   More than 50% of the time was spent in counseling/coordination of care: YES  POSSIBLE D/C IN 2 DAYS, DEPENDING ON CLINICAL CONDITION.   Demetrios Loll M.D on 03/12/2018 at 2:22 PM  Between 7am to 6pm - Pager - [phone]  After 6pm go to [email or web address] - [facility]

## 2018-03-12 NOTE — Progress Notes (Addendum)
Inpatient Diabetes Program Recommendations  AACE/ADA: New Consensus Statement on Inpatient Glycemic Control (2015)  Target Ranges:  Prepandial:   less than 140 mg/dL      Peak postprandial:   less than 180 mg/dL (1-2 hours)      Critically ill patients:  140 - 180 mg/dL   Results for Maria Bentley, Maria Bentley (MRN [id number]) as of 03/12/2018 08:54  Ref. Range 03/11/2018 13:30 03/11/2018 18:33 03/11/2018 21:36 03/12/2018 08:09  Glucose-Capillary Latest Ref Range: 65 - 99 mg/dL 241 (H) 255 (H) 580 (HH)  14 units NOVOLOG  410 (H)  9 units NOVOLOG     Home DM Meds: NPH 60 units QHS        Novolog 20 units TID             Metformin 1000 mg QAM   Current Orders: Novolog Sensitive Correction Scale/ SSI (0-9 units) TID AC + HS      PCP: Dr. Salomon Mast- Last seen 11/22/2017.  At that visit, patient was instructed to do the following: Stop Glipizide Stop Victoza Continue Metformin Reduce NPH to 50 units with dinner Take Novolog 20 units with Breakfast (Add 5 units for every increment of 50 mg/dl >200 mg/dl) Reduce Lunch time Novolog to 10 units  Reduce Dinner time Novolog to 10 units (Add 5 units for every increment of 50 mg/dl >200 mg/dl)        MD- Please consider the following in-hospital insulin adjustments:  1. Please start Levemir 45 units daily (please start this AM).  Patient takes NPH insulin at home but pharmacy will substitute Levemir.  This dose would be about 75% total home dose to start.  2. Start Novolog Meal Coverage: Novolog 10 units TID with meals (hold if pt eats <50% of meal) This dose would be 50% total home dose of Novolog      --Will follow patient during hospitalization--  Wyn Quaker RN, MSN, CDE Diabetes Coordinator Inpatient Glycemic Control Team Team Pager: [phone] (8a-5p)

## 2018-03-12 NOTE — Progress Notes (Signed)
03/12/2018  Subjective: Patient still having elevated glucose, but reports feeling better.  Had I&D of left buttocks abscess in ED yesterday.  Vital signs: Temp:  [98.3 F (36.8 C)-98.8 F (37.1 C)] 98.4 F (36.9 C) (04/02 0412) Pulse Rate:  [102-130] 102 (04/02 0412) Resp:  [14-27] 18 (04/02 0412) BP: (112-165)/(58-99) 112/58 (04/02 0412) SpO2:  [86 %-97 %] 94 % (04/02 0412) Weight:  [259 lb (117.5 kg)] 259 lb (117.5 kg) (04/01 1327)   Intake/Output: 04/01 0701 - 04/02 0700 In: 2019 [P.O.:480; I.V.:939; IV Piggyback:600] Out: 1000 [Urine:1000] Last BM Date: 03/07/18  Physical Exam: Constitutional: No acute distress Skin:  Left buttocks, perianal area s/p I&D, with small 5 mm incision noted.  No significant induration and small amount of erythema only.  No significant tenderness to palpation.  Minimal purulent fluid drainage.  Labs:  Recent Labs    03/11/18 1348 03/12/18 0458  WBC 12.4* 6.9  HGB 12.4 11.2*  HCT 41.6 36.4  PLT 536* 372   Recent Labs    03/11/18 1348 03/12/18 0422  NA 135 134*  K 3.8 4.3  CL 96* 99*  CO2 27 26  GLUCOSE 219* 487*  BUN 16 16  CREATININE 1.23* 1.38*  CALCIUM 10.1 8.7*   No results for input(s): LABPROT, INR in the last 72 hours.  Imaging: No results found.  Assessment/Plan: 46 yo female with left perianal / buttocks abscess, s/p I&D in ED.  --no further I&D needed and the area seems to be improving and healing. At this point would be ok to discharge from surgery standpoint, but her glucose is not under good control and was very elevated last night and this morning. --continue antibiotics.  Would recommend changing to oral cipro/flagyl for 10 day outpatient course when she's ready for discharge.  She is allergic to amoxicillin and sulfa. --she may follow up in surgery office or with PCP   Melvyn Neth, [place]

## 2018-03-12 NOTE — Plan of Care (Signed)
Pt with minimal pain and drainage at abscess site. IV abx given. FSBS critically high. MD aware and has increased her sliding scale as well as adding levemir. Possible d/c in am.

## 2018-03-13 ENCOUNTER — Telehealth: Payer: Self-pay | Admitting: Internal Medicine

## 2018-03-13 LAB — BASIC METABOLIC PANEL
Anion gap: 8 (ref 5–15)
BUN: 15 mg/dL (ref 6–20)
CHLORIDE: 102 mmol/L (ref 101–111)
CO2: 28 mmol/L (ref 22–32)
CREATININE: 1.22 mg/dL — AB (ref 0.44–1.00)
Calcium: 8.9 mg/dL (ref 8.9–10.3)
GFR calc non Af Amer: 52 mL/min — ABNORMAL LOW (ref >60)
Glucose, Bld: 356 mg/dL — ABNORMAL HIGH (ref 65–99)
Potassium: 4 mmol/L (ref 3.5–5.1)
SODIUM: 138 mmol/L (ref 135–145)

## 2018-03-13 LAB — HIV ANTIBODY (ROUTINE TESTING W REFLEX): HIV SCREEN 4TH GENERATION: NONREACTIVE

## 2018-03-13 LAB — GLUCOSE, CAPILLARY
GLUCOSE-CAPILLARY: 383 mg/dL — AB (ref 65–99)
GLUCOSE-CAPILLARY: 356 mg/dL — AB (ref 65–99)

## 2018-03-13 MED ORDER — DOCUSATE SODIUM 100 MG PO CAPS: 100.0000 mg | ORAL_CAPSULE | Freq: Two times a day (BID) | ORAL | 0 refills | Status: DC | PRN

## 2018-03-13 MED ORDER — METRONIDAZOLE 500 MG PO TABS: 500.0000 mg | ORAL_TABLET | Freq: Three times a day (TID) | ORAL | 0 refills | Status: AC

## 2018-03-13 MED ORDER — TRAMADOL HCL 50 MG PO TABS: 50.0000 mg | ORAL_TABLET | Freq: Four times a day (QID) | ORAL | 0 refills | Status: DC | PRN

## 2018-03-13 MED ORDER — INSULIN DETEMIR 100 UNIT/ML ~~LOC~~ SOLN
11.0000 [IU] | Freq: Once | SUBCUTANEOUS | Status: AC
  Administered 2018-03-13: 11 [IU] via SUBCUTANEOUS
  Filled 2018-03-13: qty 0.11

## 2018-03-13 MED ORDER — CIPROFLOXACIN HCL 500 MG PO TABS: 500.0000 mg | ORAL_TABLET | Freq: Two times a day (BID) | ORAL | 0 refills | Status: AC

## 2018-03-13 MED ORDER — INSULIN DETEMIR 100 UNIT/ML FLEXPEN: 55.0000 [IU] | PEN_INJECTOR | Freq: Every day | SUBCUTANEOUS | 11 refills | Status: DC

## 2018-03-13 MED ORDER — INSULIN DETEMIR 100 UNIT/ML ~~LOC~~ SOLN
55.0000 [IU] | Freq: Every day | SUBCUTANEOUS | Status: DC
  Filled 2018-03-13: qty 0.55

## 2018-03-13 MED ORDER — METOPROLOL TARTRATE 25 MG PO TABS: 25.0000 mg | ORAL_TABLET | Freq: Two times a day (BID) | ORAL | 0 refills | Status: DC

## 2018-03-13 NOTE — Telephone Encounter (Signed)
Mychart message sent:   Maria Bentley,  According to discharge summary,  You were  discharged on levemir 55 units and novolog before each meal in a sliding scale .  You should not be using novolin .  If it was refilled,  Then it must have been requested,  But do not use it.    Levemri rx sent.  Needs hospital follow up ASAP

## 2018-03-13 NOTE — Telephone Encounter (Signed)
Pharmacist needs Provider to call her in regard to insulin orders. Triage could not assist.

## 2018-03-13 NOTE — Discharge Summary (Signed)
[facility] NAME: Keeara Frees    MR#:  [id number]  DATE OF BIRTH:  13-Feb-1971  DATE OF ADMISSION:  03/11/2018 ADMITTING PHYSICIAN: Nicholes Mango, MD  DATE OF DISCHARGE:  03/13/18  PRIMARY CARE PHYSICIAN: [name], MD    ADMISSION DIAGNOSIS:  Lactic acidosis [E87.2] Abscess [L02.91] Hyperglycemia [R73.9] Sepsis ([place]) [A41.9]  DISCHARGE DIAGNOSIS:  Active Problems:   Sepsis ([place])   Abscess   SECONDARY DIAGNOSIS:   Past Medical History:  Diagnosis Date  . Anemia   . Anxiety   . Depression 2010  . Diabetes mellitus   . Endometriosis   . Headache, common migraine, intractable   . History of ovarian cyst    left sided, caused by her endometriosis (possibly an endometrioma), in her 43s.  [place] Kitchen Hyperlipidemia   . Hypertension   . Kidney stones    left  . Morbid obesity ([place])    s/p gastric bypass  . Personal hx of gastric bypass 2010  . Rosacea   . Vertigo     HOSPITAL COURSE:  Shatarra Wehling  is a 47 y.o. female with a known history of insulin requiring diabetes metas, essential hypertension, migraine, obesity has been not feeling well for the past 1 week and stopped taking her blood pressure medications as well as  long-acting insulin for diabetes.  Patient is presenting with pain in the rectal area.  Diagnosed with rectal abscess which was lanced in the emergency department and patient is started on broad-spectrum IV antibiotics.  Patient was septic with elevated lactic acid level and tachycardia.  Denies any fever    #Sepsis from perirectal abscess Patient met septic criteria at the time of admission with the tachycardia and elevated lactic acid  status post I&D in the ED Given cefepime and blood cultures negative so far.  Wound cultures does not seem to be done Changed to Cipro and Flagyl p.o. for 10 days started on 03/12/2018  per Dr. Dahlia Byes.  Surgery signed off  #AK I, creatinine improving with IV  fluids avoid nephrotoxins creatinine 1.38-1.22 PCP to repeat BMP in a week  during follow-up  #Hyperglycemia with insulin requiring diabetes mellitus  Started Levemir 45 units, increase to 55 units patient usually takes 60 units at home NovoLog before meals and continue sliding scale insulin and hold metformin.   #Essential hypertension;patient is noncompliant with her medications Resumed metoprolol and titrate as needed  #Migraine Sumatriptan as needed and continue Topamax  Morbid obesity.   DISCHARGE CONDITIONS:  stable  CONSULTS OBTAINED:     PROCEDURES IND in the emergency department  DRUG ALLERGIES:   Allergies  Allergen Reactions  . Avocado Swelling  . Sulfa Drugs Cross Reactors Anaphylaxis  . Adhesive [Tape]   . Amoxicillin Hives  . Clarithromycin   . Honey   . Other Hives and Swelling    All melon  . Watermelon Concentrate     DISCHARGE MEDICATIONS:   Allergies as of 03/13/2018      Reactions   Avocado Swelling   Sulfa Drugs Cross Reactors Anaphylaxis   Adhesive [tape]    Amoxicillin Hives   Clarithromycin    Honey    Other Hives, Swelling   All melon   Watermelon Concentrate       Medication List    STOP taking these medications   cephALEXin 500 MG capsule Commonly known as:  KEFLEX   ferrous fumarate-iron polysaccharide complex 162-115.2 MG Caps capsule Commonly known  as:  TANDEM   fluticasone 50 MCG/ACT nasal spray Commonly known as:  FLONASE   metFORMIN 500 MG 24 hr tablet Commonly known as:  GLUCOPHAGE-XR   nystatin powder Commonly known as:  MYCOSTATIN/NYSTOP     TAKE these medications   acetaminophen 325 MG tablet Commonly known as:  TYLENOL Take by mouth.   atorvastatin 20 MG tablet Commonly known as:  LIPITOR TAKE 1 TABLET BY MOUTH EVERY DAY   BD INTEGRA SYRINGE 25G X 1" 3 ML Misc Generic drug:  SYRINGE-NEEDLE (DISP) 3 ML FOR USE WITH B12 INJECTIONS WEEKLY/MONTHLY   blood glucose meter kit and supplies  Kit Dispense based on patient and insurance preference. Use up to four times daily as directed. (FOR ICD-E11.21)   BOTOX IJ Inject as directed every 3 (three) months. For migraine   buPROPion 300 MG 24 hr tablet Commonly known as:  WELLBUTRIN XL Take 450 mg by mouth daily.   ciprofloxacin 500 MG tablet Commonly known as:  CIPRO Take 1 tablet (500 mg total) by mouth 2 (two) times daily for 8 days.   cyanocobalamin 1000 MCG/ML injection Commonly known as:  (VITAMIN B-12) Inject 1 mL (1,000 mcg total) into the muscle once a week. For 4 weeks, then monthly thereafter   docusate sodium 100 MG capsule Commonly known as:  COLACE Take 1 capsule (100 mg total) by mouth 2 (two) times daily as needed for mild constipation.   EPINEPHrine 0.3 mg/0.3 mL Soaj injection Commonly known as:  EPI-PEN   ergocalciferol 50000 units capsule Commonly known as:  DRISDOL Take 1 capsule (50,000 Units total) by mouth once a week.   escitalopram 20 MG tablet Commonly known as:  LEXAPRO Take 20 mg by mouth daily.   FREESTYLE LIBRE 14 DAY READER Devi 1 applicator by Does not apply route 4 (four) times daily.   FREESTYLE LIBRE 14 DAY SENSOR Misc 1 applicator by Does not apply route 4 (four) times daily.   glucose blood test strip Test blood sugar 5 times daily   insulin NPH Human 100 UNIT/ML injection Commonly known as:  NOVOLIN N INJECT 60 TO 70 UNITS SUBCUTANEOUSLY AT BEDTIME What changed:    how much to take  how to take this  when to take this  additional instructions   Insulin Syringe-Needle U-100 31G X 5/16" 1 ML Misc Commonly known as:  B-D INS SYR ULTRAFINE 1CC/31G 1 Syringe by Does not apply route daily.   B-D INS SYR MICROFINE 1CC/28G 28G X 1/2" 1 ML Misc Generic drug:  INS SYRINGE/NEEDLE 1CC/28G USE 1 SYRINGE DAILY AFTER SUPPER   B-D INS SYR MICROFINE 1CC/28G 28G X 1/2" 1 ML Misc Generic drug:  INS SYRINGE/NEEDLE 1CC/28G USE 1 SYRINGE DAILY AFTER SUPPER   Lactulose 20  GM/30ML Soln 30 ml every 4 hours until constipation is relieved   lamoTRIgine 100 MG tablet Commonly known as:  LAMICTAL Take 150 mg by mouth daily.   Lancet Device Misc Test blood sugar two times daily   lidocaine 2 % jelly Commonly known as:  XYLOCAINE Apply 1 application topically as needed.   LORazepam 0.5 MG tablet Commonly known as:  ATIVAN Take 0.5-1 mg by mouth daily as needed.   meclizine 25 MG tablet Commonly known as:  ANTIVERT Take 1 tablet (25 mg total) by mouth 3 (three) times daily as needed for dizziness.   metoprolol tartrate 25 MG tablet Commonly known as:  LOPRESSOR Take 1 tablet (25 mg total) by mouth 2 (two) times daily.  metroNIDAZOLE 500 MG tablet Commonly known as:  FLAGYL Take 1 tablet (500 mg total) by mouth every 8 (eight) hours for 8 days.   NOVOLOG FLEXPEN 100 UNIT/ML FlexPen Generic drug:  insulin aspart INJECT 20 UNITS INTO THE SKIN 3 (THREE) TIMES DAILY WITH MEALS.   oxyCODONE-acetaminophen 10-325 MG tablet Commonly known as:  PERCOCET Take 1 tablet by mouth every 6 (six) hours as needed for pain.   Pen Needles 31G X 6 MM Misc For use with victoza /saxenda   pregabalin 300 MG capsule Commonly known as:  LYRICA Take 600 mg by mouth at bedtime.   promethazine 25 MG tablet Commonly known as:  PHENERGAN TAKE 1 TABLET (25 MG TOTAL) BY MOUTH EVERY 8 (EIGHT) HOURS AS NEEDED.   SUMAtriptan 100 MG tablet Commonly known as:  IMITREX TAKE 1 TABLET BY MOUTH EVERY DAY AS NEEDED MAY REPEAT IN 2HRS IF HEADACHE PERSISTS. MAX 2 TABS/24 HR   traMADol 50 MG tablet Commonly known as:  ULTRAM Take 1 tablet (50 mg total) by mouth every 6 (six) hours as needed.   traMADol 50 MG tablet Commonly known as:  ULTRAM Take 1 tablet (50 mg total) by mouth every 6 (six) hours as needed for moderate pain.   TROKENDI XR 50 MG Cp24 Generic drug:  Topiramate ER Take 1 capsule by mouth daily.   VENTOLIN HFA 108 (90 Base) MCG/ACT inhaler Generic drug:   albuterol INHALE ONE PUFF BY MOUTH EVERY SIX HOURS AS NEEDED FOR WHEEZING        DISCHARGE INSTRUCTIONS:    Follow-up with primary care physician in a week  follow-up with surgery in a week  DIET:  Cardiac diet and Diabetic diet  DISCHARGE CONDITION:  Stable  ACTIVITY:  Activity as tolerated  OXYGEN:  Home Oxygen: No.   Oxygen Delivery: room air  DISCHARGE LOCATION:  home   If you experience worsening of your admission symptoms, develop shortness of breath, life threatening emergency, suicidal or homicidal thoughts you must seek medical attention immediately by calling 911 or calling your MD immediately  if symptoms less severe.  You Must read complete instructions/literature along with all the possible adverse reactions/side effects for all the Medicines you take and that have been prescribed to you. Take any new Medicines after you have completely understood and accpet all the possible adverse reactions/side effects.   Please note  You were cared for by a hospitalist during your hospital stay. If you have any questions about your discharge medications or the care you received while you were in the hospital after you are discharged, you can call the unit and asked to speak with the hospitalist on call if the hospitalist that took care of you is not available. Once you are discharged, your primary care physician will handle any further medical issues. Please note that NO REFILLS for any discharge medications will be authorized once you are discharged, as it is imperative that you return to your primary care physician (or establish a relationship with a primary care physician if you do not have one) for your aftercare needs so that they can reassess your need for medications and monitor your lab values.     Today  Chief Complaint  Patient presents with  . Hyperglycemia    Patient is feeling much better.  Pain in the ankle area is significantly improved and okay to  discharge patient from surgery standpoint ROS:  CONSTITUTIONAL: Denies fevers, chills. Denies any fatigue, weakness.  EYES: Denies blurry vision,  double vision, eye pain. EARS, NOSE, THROAT: Denies tinnitus, ear pain, hearing loss. RESPIRATORY: Denies cough, wheeze, shortness of breath.  CARDIOVASCULAR: Denies chest pain, palpitations, edema.  GASTROINTESTINAL: Denies nausea, vomiting, diarrhea, abdominal pain. Denies bright red blood per rectum. GENITOURINARY: Denies dysuria, hematuria. ENDOCRINE: Denies nocturia or thyroid problems. HEMATOLOGIC AND LYMPHATIC: Denies easy bruising or bleeding. SKIN: Denies rash or lesion. MUSCULOSKELETAL: Denies pain in neck, back, shoulder, knees, hips or arthritic symptoms.  NEUROLOGIC: Denies paralysis, paresthesias.  PSYCHIATRIC: Denies anxiety or depressive symptoms.   VITAL SIGNS:  Blood pressure (!) 141/89, pulse 86, temperature 97.9 F (36.6 C), temperature source Oral, resp. rate 17, height '5\' 4"'$  (1.626 m), weight 117.5 kg (259 lb), last menstrual period 05/09/2016, SpO2 99 %.  I/O:    Intake/Output Summary (Last 24 hours) at 03/13/2018 1431 Last data filed at 03/13/2018 1428 Gross per 24 hour  Intake 1696.67 ml  Output 5501 ml  Net -3804.33 ml    PHYSICAL EXAMINATION:  GENERAL:  47 y.o.-year-old patient lying in the bed with no acute distress.  EYES: Pupils equal, round, reactive to light and accommodation. No scleral icterus. Extraocular muscles intact.  HEENT: Head atraumatic, normocephalic. Oropharynx and nasopharynx clear.  NECK:  Supple, no jugular venous distention. No thyroid enlargement, no tenderness.  LUNGS: Normal breath sounds bilaterally, no wheezing, rales,rhonchi or crepitation. No use of accessory muscles of respiration.  CARDIOVASCULAR: S1, S2 normal. No murmurs, rubs, or gallops.  ABDOMEN: Soft, non-tender, non-distended. Bowel sounds present. No organomegaly or mass.  EXTREMITIES: No pedal edema, cyanosis, or  clubbing.  NEUROLOGIC: Cranial nerves II through XII are intact. Muscle strength 5/5 in all extremities. Sensation intact. Gait not checked.  PSYCHIATRIC: The patient is alert and oriented x 3.  SKIN: No obvious rash, lesion, or ulcer.   DATA REVIEW:   CBC Recent Labs  Lab 03/12/18 0458  WBC 6.9  HGB 11.2*  HCT 36.4  PLT 372    Chemistries  Recent Labs  Lab 03/12/18 0422 03/13/18 0445  NA 134* 138  K 4.3 4.0  CL 99* 102  CO2 26 28  GLUCOSE 487* 356*  BUN 16 15  CREATININE 1.38* 1.22*  CALCIUM 8.7* 8.9  AST 12*  --   ALT 16  --   ALKPHOS 110  --   BILITOT 0.8  --     Cardiac Enzymes No results for input(s): TROPONINI in the last 168 hours.  Microbiology Results  Results for orders placed or performed during the hospital encounter of 03/11/18  Blood culture (routine x 2)     Status: None (Preliminary result)   Collection Time: 03/11/18  4:07 PM  Result Value Ref Range Status   Specimen Description BLOOD RIGHT FOREARM  Final   Special Requests   Final    BOTTLES DRAWN AEROBIC AND ANAEROBIC Blood Culture adequate volume   Culture   Final    NO GROWTH 2 DAYS Performed at [facility], [address] [place], [place] [address]    Report Status PENDING  Incomplete  Blood culture (routine x 2)     Status: None (Preliminary result)   Collection Time: 03/11/18  4:07 PM  Result Value Ref Range Status   Specimen Description BLOOD RIGHT FOREARM  Final   Special Requests   Final    BOTTLES DRAWN AEROBIC AND ANAEROBIC Blood Culture adequate volume   Culture   Final    NO GROWTH 2 DAYS Performed at [facility], [address] [place], [place]  [address]    Report Status PENDING  Incomplete    RADIOLOGY:  No results found.  EKG:   Orders placed or performed during the hospital encounter of 03/11/18  . EKG 12-Lead  . EKG 12-Lead      Management plans discussed with the patient, family and they are in agreement.  CODE STATUS:      Code Status Orders  (From admission, onward)        Start     Ordered   03/11/18 1803  Full code  Continuous     03/11/18 1803    Code Status History    Date Active Date Inactive Code Status Order ID Comments User Context   05/29/2015 2143 06/04/2015 1427 Full Code [id number]  Hower, Aaron Mose, MD ED      TOTAL TIME TAKING CARE OF THIS PATIENT: 43  minutes.   Note: This dictation was prepared with Dragon dictation along with smaller phrase technology. Any transcriptional errors that result from this process are unintentional.   '@MEC'$ @  on 03/13/2018 at 2:31 PM  Between 7am to 6pm - Pager - [phone]  After 6pm go to [email or web address] - password [email or web address] [facility]  Office  [phone]  CC: Primary care physician; [name], MD

## 2018-03-13 NOTE — Discharge Instructions (Signed)
°  Follow-up with primary care physician in a week  follow-up with surgery in a week

## 2018-03-13 NOTE — Progress Notes (Signed)
Discharge instructions reviewed with patient and husband, including new medications and other medication changes.  Understanding was verbalized and all questions were answered.  Patient discharged home via wheelchair in stable condition escorted by nursing staff.

## 2018-03-13 NOTE — Progress Notes (Signed)
Inpatient Diabetes Program Recommendations  AACE/ADA: New Consensus Statement on Inpatient Glycemic Control (2015)  Target Ranges:  Prepandial:   less than 140 mg/dL      Peak postprandial:   less than 180 mg/dL (1-2 hours)      Critically ill patients:  140 - 180 mg/dL   Lab Results  Component Value Date   GLUCAP 383 (H) 03/13/2018   HGBA1C 8.2 11/22/2017    Review of Glycemic ControlResults for JACALYN, BIGGS (MRN [id number]) as of 03/13/2018 11:17  Ref. Range 03/12/2018 12:06 03/12/2018 17:15 03/12/2018 21:06 03/13/2018 04:45 03/13/2018 07:42  Glucose-Capillary Latest Ref Range: 65 - 99 mg/dL 446 (H) 179 (H) 164 (H)  383 (H)   Home DM Meds: NPH 60 units QHS                              Novolog 20 units TID                              Metformin 1000 mg QAM Current orders for Inpatient glycemic control: Levemir 45 units daily, Novolog 10 units tid with meals, Novolog resistant tid with meals Inpatient Diabetes Program Recommendations:   May consider increasing Levemir to 55 units daily and increase Novolog to 12 units tid with meals while in the hospital.    Thanks,  Adah Perl, RN, BC-ADM Inpatient Diabetes Coordinator Pager [phone] (8a-5p)

## 2018-03-13 NOTE — Telephone Encounter (Signed)
Copied from [place] [id number]. Topic: Quick Communication - Rx Refill/Question >> Mar 13, 2018  2:32 PM Robina Ade, Helene Kelp D wrote: Medication: novolog med  Has the patient contacted their pharmacy? Yes pharmacy needs clarification on medication instruction. Call them back, please. (Agent: If no, request that the patient contact the pharmacy for the refill.) Preferred Pharmacy (with phone number or street name): [facility] [place], [place]: Please be advised that RX refills may take up to 3 business days. We ask that you follow-up with your pharmacy.

## 2018-03-13 NOTE — Progress Notes (Signed)
03/13/2018  Subjective: No acute events.  Patient reports she feels significantly better.  Denies any worsening pain.  Reports improved erythema and only having some serous drainage.  Vital signs: Temp:  [97.6 F (36.4 C)-98.4 F (36.9 C)] 97.9 F (36.6 C) (04/03 1210) Pulse Rate:  [86-99] 86 (04/03 1210) Resp:  [17-20] 17 (04/03 1210) BP: (137-155)/(77-106) 141/89 (04/03 1210) SpO2:  [94 %-99 %] 99 % (04/03 1210)   Intake/Output: 04/02 0701 - 04/03 0700 In: 1963.3 [P.O.:760; I.V.:1203.3] Out: 5001 [Urine:5001] Last BM Date: 03/08/18  Physical Exam: Constitutional: No acute distress Skin:  I&D site looks clean, without purulence.  Erythema much improved. No induration or significant tenderness.  Labs:  Recent Labs    03/11/18 1348 03/12/18 0458  WBC 12.4* 6.9  HGB 12.4 11.2*  HCT 41.6 36.4  PLT 536* 372   Recent Labs    03/12/18 0422 03/13/18 0445  NA 134* 138  K 4.3 4.0  CL 99* 102  CO2 26 28  GLUCOSE 487* 356*  BUN 16 15  CREATININE 1.38* 1.22*  CALCIUM 8.7* 8.9   No results for input(s): LABPROT, INR in the last 72 hours.  Imaging: No results found.  Assessment/Plan: 47 yo female s/p I&D of left buttocks abscess in ED  --may discharge home from surgical standpoint.  She may follow up with [place] or with her PCP as outpatient.  Have placed our office information on her chart.   --recommend cipro/flagyl for outpatient 10 day course.  This was started yesterday. --will sign off for now.  Please feel free to call or consult again if any questions or concerns.   Melvyn Neth, [place]

## 2018-03-13 NOTE — Telephone Encounter (Signed)
fyi

## 2018-03-14 MED ORDER — FREESTYLE LIBRE 14 DAY SENSOR MISC: 1.0000 | Freq: Four times a day (QID) | 1 refills | Status: DC

## 2018-03-14 NOTE — Telephone Encounter (Signed)
Able to reach patient she is going to pick up the [place] from pharmacy an stop novolin as directed. Patient also stated she has the sensor meter for insulin but that she needs a script for the Insulin sensor pads.

## 2018-03-14 NOTE — Telephone Encounter (Signed)
Refilled from chart

## 2018-03-14 NOTE — Telephone Encounter (Signed)
Left message for patient to return call to office , PEC please see my chart message from PCP and advise patient.

## 2018-03-15 MED ORDER — FLUCONAZOLE 150 MG PO TABS: 150.0000 mg | ORAL_TABLET | Freq: Every day | ORAL | 0 refills | Status: DC

## 2018-03-16 LAB — CULTURE, BLOOD (ROUTINE X 2)
Culture: NO GROWTH
Culture: NO GROWTH
Special Requests: ADEQUATE
Special Requests: ADEQUATE

## 2018-03-18 ENCOUNTER — Encounter: Payer: Self-pay | Admitting: Internal Medicine

## 2018-03-18 ENCOUNTER — Other Ambulatory Visit: Payer: Self-pay | Admitting: Internal Medicine

## 2018-03-18 DIAGNOSIS — E1165 Type 2 diabetes mellitus with hyperglycemia: Secondary | ICD-10-CM

## 2018-03-18 NOTE — Telephone Encounter (Signed)
Patient aware has read my chart message.

## 2018-03-18 NOTE — Progress Notes (Signed)
Labs ordered.

## 2018-03-21 ENCOUNTER — Telehealth: Payer: Self-pay

## 2018-03-21 ENCOUNTER — Inpatient Hospital Stay: Payer: BC Managed Care – PPO | Admitting: Internal Medicine

## 2018-03-21 NOTE — Telephone Encounter (Signed)
Flagged on EMMI report for answering "I don't know" to question regarding taking meds.  1st attempt to call patient made on 03/21/18 at 1:56pm, however had to leave voicemail encouraging patient to callback.  Will attempt at later time.

## 2018-03-22 NOTE — Telephone Encounter (Signed)
2nd attempt to reach patient made on 03/22/18 at 2:06pm, though unable to reach.  Another voicemail left encouraging patient to call back should she have any questions or concerns.

## 2018-03-25 ENCOUNTER — Inpatient Hospital Stay: Payer: BC Managed Care – PPO | Admitting: Internal Medicine

## 2018-03-28 ENCOUNTER — Other Ambulatory Visit: Payer: Self-pay | Admitting: Internal Medicine

## 2018-04-01 NOTE — Telephone Encounter (Signed)
Refilled: 03/13/2018 Last OV: 11/22/2017 Next OV: 04/29/2018

## 2018-04-03 NOTE — Telephone Encounter (Signed)
Printed, signed and faxed.  

## 2018-04-18 ENCOUNTER — Ambulatory Visit: Payer: BC Managed Care – PPO | Admitting: Family Medicine

## 2018-04-19 ENCOUNTER — Ambulatory Visit: Payer: BC Managed Care – PPO | Admitting: Internal Medicine

## 2018-04-19 ENCOUNTER — Encounter: Payer: Self-pay | Admitting: Internal Medicine

## 2018-04-19 ENCOUNTER — Ambulatory Visit: Payer: BC Managed Care – PPO | Admitting: Family

## 2018-04-19 VITALS — BP 138/80 | HR 101 | Temp 97.7°F | Resp 16 | Ht 64.0 in | Wt 264.2 lb

## 2018-04-19 DIAGNOSIS — R197 Diarrhea, unspecified: Secondary | ICD-10-CM

## 2018-04-19 DIAGNOSIS — E1165 Type 2 diabetes mellitus with hyperglycemia: Secondary | ICD-10-CM | POA: Diagnosis not present

## 2018-04-19 LAB — CBC WITH DIFFERENTIAL/PLATELET
BASOS ABS: 0.1 10*3/uL (ref 0.0–0.1)
Basophils Relative: 1.5 % (ref 0.0–3.0)
Eosinophils Absolute: 0 10*3/uL (ref 0.0–0.7)
Eosinophils Relative: 0.7 % (ref 0.0–5.0)
HCT: 34.7 % — ABNORMAL LOW (ref 36.0–46.0)
Hemoglobin: 10.3 g/dL — ABNORMAL LOW (ref 12.0–15.0)
LYMPHS PCT: 41 % (ref 12.0–46.0)
Lymphs Abs: 2.6 10*3/uL (ref 0.7–4.0)
MCV: 79.3 fl (ref 78.0–100.0)
MONOS PCT: 7.3 % (ref 3.0–12.0)
Monocytes Absolute: 0.5 10*3/uL (ref 0.1–1.0)
Neutro Abs: 3.1 10*3/uL (ref 1.4–7.7)
Neutrophils Relative %: 49.5 % (ref 43.0–77.0)
PLATELETS: 576 10*3/uL — AB (ref 150.0–400.0)
RBC: 4.37 Mil/uL (ref 3.87–5.11)
RDW: 20.6 % — AB (ref 11.5–15.5)
WBC: 6.2 10*3/uL (ref 4.0–10.5)

## 2018-04-19 LAB — COMPREHENSIVE METABOLIC PANEL
ALBUMIN: 3 g/dL — AB (ref 3.5–5.2)
ALK PHOS: 137 U/L — AB (ref 39–117)
ALT: 18 U/L (ref 0–35)
AST: 17 U/L (ref 0–37)
BUN: 20 mg/dL (ref 6–23)
CHLORIDE: 102 meq/L (ref 96–112)
CO2: 31 mEq/L (ref 19–32)
Calcium: 8.5 mg/dL (ref 8.4–10.5)
Creatinine, Ser: 1.16 mg/dL (ref 0.40–1.20)
GFR: 53.05 mL/min — AB (ref >60.00)
GLUCOSE: 99 mg/dL (ref 70–99)
POTASSIUM: 4 meq/L (ref 3.5–5.1)
SODIUM: 140 meq/L (ref 135–145)
TOTAL PROTEIN: 6.7 g/dL (ref 6.0–8.3)
Total Bilirubin: 0.2 mg/dL (ref 0.2–1.2)

## 2018-04-19 LAB — HEMOGLOBIN A1C: HEMOGLOBIN A1C: 11.5 % — AB (ref 4.6–6.5)

## 2018-04-19 LAB — MAGNESIUM: MAGNESIUM: 1.8 mg/dL (ref 1.5–2.5)

## 2018-04-19 NOTE — Progress Notes (Signed)
Subjective:  Patient ID: Maria Bentley, female    DOB: 03/05/1971  Age: 47 y.o. MRN: [id number]  CC: The primary encounter diagnosis was Uncontrolled type 2 diabetes mellitus with hyperglycemia ([place]). A diagnosis of Diarrhea of presumed infectious origin was also pertinent to this visit.  HPI Maria Bentley presents for  evaluation of profuse watery diarrhea occurring 6-7 times daily  Which started nearly a month ago.  She has not been seen since her hospital discharge on April 3  .  The diarrhea started  7 to 10 days after discharge from [facility]  For treatment of sepsis  That occurred from a  perirectal abscess .  She was discharged home on cipro/falgyl for 10 days.  The  Diarrhea started after she had finished the antibiotics,  With  4-5 watery stools daily foul smelling ,  Acc by lots of gas.   .  She tried taking a probiotic when diarrhea  Started (was not on one chronically  And did not use one proactively despite a  HISTORY OF C DIF)  With no change in stool consistency.  Has been using Imodium in order to attend work.  Using Immodium has made her feels worse and resulted in  nausea ,  Chills anorexa,  Subjective fevers   Last stool was last evening ,. Last imodium dose was yesterday morning 2 capsules once daily    Uncontrolled DM  She was discharged with rx for Levemir.  She has been taking 55 units daily along with   Uses 20 units of Novolog  in am ( adjusts dose by adding  5 units for  every 50 pts over 200),   Metformin has been suspended .  Sugars have ranged from 100 to 160 , 2 lows due to anorexia . No insulin if lunch sugar is <150/  And adds 5 for every 50 pts over 150.  dinner time dose is 15 units baseline plus 5 for every 50   2) localized inflammatory reaction to Levemir injections:  Subcutaneous tender lumps ,  Nondraining.  Takes up to 2weeks to resolve.   Lab Results  Component Value Date   HGBA1C 11.5 (H) 04/19/2018     Outpatient Medications Prior to  Visit  Medication Sig Dispense Refill  . acetaminophen (TYLENOL) 325 MG tablet Take by mouth.    Marland Kitchen atorvastatin (LIPITOR) 20 MG tablet TAKE 1 TABLET BY MOUTH EVERY DAY 90 tablet 1  . B-D INS SYR MICROFINE 1CC/28G 28G X 1/2" 1 ML MISC USE 1 SYRINGE DAILY AFTER SUPPER 100 each 1  . B-D INS SYR MICROFINE 1CC/28G 28G X 1/2" 1 ML MISC USE 1 SYRINGE DAILY AFTER SUPPER 100 each 3  . BD INTEGRA SYRINGE 25G X 1" 3 ML MISC FOR USE WITH B12 INJECTIONS WEEKLY/MONTHLY 25 each 0  . blood glucose meter kit and supplies KIT Dispense based on patient and insurance preference. Use up to four times daily as directed. (FOR ICD-E11.21) 1 each 0  . buPROPion (WELLBUTRIN XL) 300 MG 24 hr tablet Take 450 mg by mouth daily.     . Continuous Blood Gluc Receiver (FREESTYLE LIBRE 14 DAY READER) DEVI 1 applicator by Does not apply route 4 (four) times daily. 1 Device 1  . Continuous Blood Gluc Sensor (FREESTYLE LIBRE 14 DAY SENSOR) MISC 1 applicator by Does not apply route 4 (four) times daily. 1 each 1  . cyanocobalamin (,VITAMIN B-12,) 1000 MCG/ML injection Inject 1 mL (1,000 mcg total) into the  muscle once a week. For 4 weeks, then monthly thereafter 10 mL 2  . docusate sodium (COLACE) 100 MG capsule Take 1 capsule (100 mg total) by mouth 2 (two) times daily as needed for mild constipation. 10 capsule 0  . EPINEPHrine 0.3 mg/0.3 mL IJ SOAJ injection     . ergocalciferol (DRISDOL) 50000 units capsule Take 1 capsule (50,000 Units total) by mouth once a week. 4 capsule 3  . escitalopram (LEXAPRO) 20 MG tablet Take 20 mg by mouth daily.     . fluconazole (DIFLUCAN) 150 MG tablet Take 1 tablet (150 mg total) by mouth daily. 2 tablet 0  . glucose blood test strip Test blood sugar 5 times daily 450 each 1  . Insulin Pen Needle (PEN NEEDLES) 31G X 6 MM MISC For use with victoza /saxenda 100 each 0  . Insulin Syringe-Needle U-100 (B-D INS SYR ULTRAFINE 1CC/31G) 31G X 5/16" 1 ML MISC 1 Syringe by Does not apply route daily. 100 each  0  . Lactulose 20 GM/30ML SOLN 30 ml every 4 hours until constipation is relieved 236 mL 3  . lamoTRIgine (LAMICTAL) 100 MG tablet Take 150 mg by mouth daily.     [name] Device MISC Test blood sugar two times daily 100 each 6  . lidocaine (XYLOCAINE) 2 % jelly Apply 1 application topically as needed. 30 mL 2  . LORazepam (ATIVAN) 0.5 MG tablet Take 0.5-1 mg by mouth daily as needed.     . meclizine (ANTIVERT) 25 MG tablet Take 1 tablet (25 mg total) by mouth 3 (three) times daily as needed for dizziness. 90 tablet 6  . metoprolol tartrate (LOPRESSOR) 25 MG tablet Take 1 tablet (25 mg total) by mouth 2 (two) times daily. 60 tablet 0  . NOVOLOG FLEXPEN 100 UNIT/ML FlexPen INJECT 20 UNITS INTO THE SKIN 3 (THREE) TIMES DAILY WITH MEALS. 6 pen 8  . OnabotulinumtoxinA (BOTOX IJ) Inject as directed every 3 (three) months. For migraine    . oxyCODONE-acetaminophen (PERCOCET) 10-325 MG tablet Take 1 tablet by mouth every 6 (six) hours as needed for pain.    . pregabalin (LYRICA) 300 MG capsule Take 600 mg by mouth at bedtime.     . promethazine (PHENERGAN) 25 MG tablet TAKE 1 TABLET (25 MG TOTAL) BY MOUTH EVERY 8 (EIGHT) HOURS AS NEEDED. 15 tablet 5  . SUMAtriptan (IMITREX) 100 MG tablet TAKE 1 TABLET BY MOUTH EVERY DAY AS NEEDED MAY REPEAT IN 2HRS IF HEADACHE PERSISTS. MAX 2 TABS/24 HR 9 tablet 0  . Topiramate ER (TROKENDI XR) 50 MG CP24 Take 1 capsule by mouth daily.    . traMADol (ULTRAM) 50 MG tablet Take 1 tablet (50 mg total) by mouth every 6 (six) hours as needed. 90 tablet 0  . traMADol (ULTRAM) 50 MG tablet Take 1 tablet (50 mg total) by mouth every 6 (six) hours as needed for moderate pain. 30 tablet 0  . traMADol (ULTRAM) 50 MG tablet TABLET BY MOUTH EVERY 6 HOURS AS NEEDED 90 tablet 0  . VENTOLIN HFA 108 (90 BASE) MCG/ACT inhaler INHALE ONE PUFF BY MOUTH EVERY SIX HOURS AS NEEDED FOR WHEEZING  18 g 3  . Insulin Detemir (LEVEMIR) 100 UNIT/ML Pen Inject 55 Units into the skin daily at 10 pm. 15  mL 11   Facility-Administered Medications Prior to Visit  Medication Dose Route Frequency Provider Last Rate Last Dose  . betamethasone acetate-betamethasone sodium phosphate (CELESTONE) injection 3 mg  3 mg Intramuscular Once Evans,  Dorathy Daft, DPM        Review of Systems;  Patient denies headache, fevers, malaise, unintentional weight loss, skin rash, eye pain, sinus congestion and sinus pain, sore throat, dysphagia,  hemoptysis , cough, dyspnea, wheezing, chest pain, palpitations, orthopnea, edema, abdominal pain, nausea, melena, diarrhea, constipation, flank pain, dysuria, hematuria, urinary  Frequency, nocturia, numbness, tingling, seizures,  Focal weakness, Loss of consciousness,  Tremor, insomnia, depression, anxiety, and suicidal ideation.      Objective:  BP 138/80 (BP Location: Left Arm, Patient Position: Sitting, Cuff Size: Normal)   Pulse (!) 101   Temp 97.7 F (36.5 C) (Oral)   Resp 16   Ht _0  (1.626 m)   Wt 264 lb 3.2 oz (119.8 kg)   LMP 05/09/2016   SpO2 96%   BMI 45.35 kg/m   BP Readings from Last 3 Encounters:  04/19/18 138/80  03/13/18 (!) 141/89  01/18/18 131/83    Wt Readings from Last 3 Encounters:  04/19/18 264 lb 3.2 oz (119.8 kg)  03/11/18 259 lb (117.5 kg)  01/18/18 263 lb 4.8 oz (119.4 kg)    General appearance: alert, cooperative and appears stated age Ears: normal TM's and external ear canals both ears Throat: lips, mucosa, and tongue normal; teeth and gums normal Neck: no adenopathy, no carotid bruit, supple, symmetrical, trachea midline and thyroid not enlarged, symmetric, no tenderness/mass/nodules Back: symmetric, no curvature. ROM normal. No CVA tenderness. Lungs: clear to auscultation bilaterally Heart: regular rate and rhythm, S1, S2 normal, no murmur, click, rub or gallop Abdomen: soft, non-tender; bowel sounds normal; no masses,  no organomegaly Pulses: 2+ and symmetric Skin: Skin color, texture, turgor normal. No rashes or  lesions Lymph nodes: Cervical, supraclavicular, and axillary nodes normal.  Lab Results  Component Value Date   HGBA1C 11.5 (H) 04/19/2018   HGBA1C 8.2 11/22/2017   HGBA1C 8.2 02/21/2017    Lab Results  Component Value Date   CREATININE 1.16 04/19/2018   CREATININE 1.22 (H) 03/13/2018   CREATININE 1.38 (H) 03/12/2018    Lab Results  Component Value Date   WBC 6.2 04/19/2018   HGB 10.3 (L) 04/19/2018   HCT 34.7 (L) 04/19/2018   PLT 576.0 (H) 04/19/2018   GLUCOSE 99 04/19/2018   CHOL 119 11/22/2017   TRIG 134.0 11/22/2017   HDL 49.90 11/22/2017   LDLDIRECT 92.0 02/21/2017   LDLCALC 42 11/22/2017   ALT 18 04/19/2018   AST 17 04/19/2018   NA 140 04/19/2018   K 4.0 04/19/2018   CL 102 04/19/2018   CREATININE 1.16 04/19/2018   BUN 20 04/19/2018   CO2 31 04/19/2018   TSH 1.69 11/22/2017   HGBA1C 11.5 (H) 04/19/2018   MICROALBUR <0.7 11/22/2017    No results found.  Assessment & Plan:   Problem List Items Addressed This Visit    Diarrhea of presumed infectious origin    She is at high risk for c dificile coliitis given prior history and recent use of antibiotics without prophylactic probiotics.  Given her recent use of Flagyl for treatment of perirectal abscess,  Will need to use Vancomycin if c dif test is positive.  Contact precautions advised. Use of imodium discouraged.       Relevant Orders   CBC with Differential/Platelet (Completed)   Magnesium (Completed)   Clostridium difficile culture-fecal   Gastrointestinal Pathogen Panel PCR   Diabetes mellitus type 2, uncontrolled ([place]) - Primary    Resume NPH starte with 35 untis in the evening, given localized  reactions to Levemir.  Diabetes is now wildly out of control despite her reports of normalized blood sugars and frequent monitoring using the [employer] monitor . Marland Kitchen  Referring to Gray Bernhardt for assistance in managing   Lab Results  Component Value Date   HGBA1C 11.5 (H) 04/19/2018          Relevant Medications   insulin NPH Human (NOVOLIN N) 100 UNIT/ML injection   Other Relevant Orders   Hemoglobin A1c (Completed)   Comprehensive metabolic panel (Completed)   Amb Referral to Clinical Pharmacist    A total of 40 minutes was spent with patient more than half of which was spent in counseling patient on the above mentioned issues , reviewing recent hospitalization , and coordination of care.  I have discontinued Solash Anice Wilshire. Canion "Cindy"'s Insulin Detemir. I am also having her start on insulin NPH Human. Additionally, I am having her maintain her Lancet Device, Insulin Syringe-Needle U-100, pregabalin, OnabotulinumtoxinA (BOTOX IJ), VENTOLIN HFA, lamoTRIgine, LORazepam, meclizine, buPROPion, oxyCODONE-acetaminophen, Lactulose, B-D INS SYR MICROFINE 1CC/28G, blood glucose meter kit and supplies, BD INTEGRA SYRINGE, EPINEPHrine, Pen Needles, SUMAtriptan, B-D INS SYR MICROFINE 1CC/28G, escitalopram, acetaminophen, NOVOLOG FLEXPEN, ergocalciferol, cyanocobalamin, FREESTYLE LIBRE 14 DAY READER, atorvastatin, promethazine, lidocaine, glucose blood, Topiramate ER, traMADol, traMADol, metoprolol tartrate, docusate sodium, FREESTYLE LIBRE 14 DAY SENSOR, fluconazole, and traMADol. We will continue to administer betamethasone acetate-betamethasone sodium phosphate.  Meds ordered this encounter  Medications  . insulin NPH Human (NOVOLIN N) 100 UNIT/ML injection    Sig: Inject 0.35 mLs (35 Units total) into the skin 2 (two) times daily before a meal.    Dispense:  10 mL    Refill:  11    Medications Discontinued During This Encounter  Medication Reason  . Insulin Detemir (LEVEMIR) 100 UNIT/ML Pen     Follow-up: No follow-ups on file.   [name], MD

## 2018-04-19 NOTE — Patient Instructions (Addendum)
Stop the levemir since you are having a localized reaction to it.   Resume the NPH (Novolin N) starting at 35 units in the evening  adjust up or down based on fasting blood sugars I the morning

## 2018-04-20 ENCOUNTER — Other Ambulatory Visit: Payer: Self-pay | Admitting: Internal Medicine

## 2018-04-21 DIAGNOSIS — R197 Diarrhea, unspecified: Secondary | ICD-10-CM | POA: Insufficient documentation

## 2018-04-21 DIAGNOSIS — A040 Enteropathogenic Escherichia coli infection: Secondary | ICD-10-CM | POA: Insufficient documentation

## 2018-04-21 MED ORDER — INSULIN NPH (HUMAN) (ISOPHANE) 100 UNIT/ML ~~LOC~~ SUSP: 35.0000 [IU] | Freq: Two times a day (BID) | SUBCUTANEOUS | 11 refills | Status: DC

## 2018-04-21 NOTE — Assessment & Plan Note (Addendum)
Resume NPH starte with 35 untis in the evening, given localized reactions to Levemir.  Diabetes is now wildly out of control despite her reports of normalized blood sugars and frequent monitoring using the [employer] monitor . Marland Kitchen  Referring to Gray Bernhardt for assistance in managing   Lab Results  Component Value Date   HGBA1C 11.5 (H) 04/19/2018

## 2018-04-21 NOTE — Assessment & Plan Note (Signed)
She is at high risk for c dificile coliitis given prior history and recent use of antibiotics without prophylactic probiotics.  Given her recent use of Flagyl for treatment of perirectal abscess,  Will need to use Vancomycin if c dif test is positive.  Contact precautions advised. Use of imodium discouraged.

## 2018-04-22 ENCOUNTER — Telehealth: Payer: Self-pay | Admitting: Pharmacist

## 2018-04-22 ENCOUNTER — Other Ambulatory Visit: Payer: Self-pay | Admitting: Internal Medicine

## 2018-04-22 DIAGNOSIS — R197 Diarrhea, unspecified: Secondary | ICD-10-CM

## 2018-04-22 NOTE — Telephone Encounter (Signed)
Called patient to schedule for DM referral. No answer, left HIPAA-compliant VM requesting she return my call.   Carlean Jews, Pharm.D., BCPS PGY2 Ambulatory Care Pharmacy Resident Phone: [phone]

## 2018-04-23 ENCOUNTER — Ambulatory Visit: Payer: BC Managed Care – PPO | Admitting: Internal Medicine

## 2018-04-23 LAB — GASTROINTESTINAL PATHOGEN PANEL PCR
C. difficile Tox A/B, PCR: NOT DETECTED
CAMPYLOBACTER, PCR: NOT DETECTED
CRYPTOSPORIDIUM, PCR: NOT DETECTED
E COLI 0157, PCR: NOT DETECTED
E coli (ETEC) LT/ST PCR: NOT DETECTED
E coli (STEC) stx1/stx2, PCR: NOT DETECTED
Giardia lamblia, PCR: NOT DETECTED
Norovirus, PCR: NOT DETECTED
ROTAVIRUS, PCR: NOT DETECTED
Salmonella, PCR: NOT DETECTED
Shigella, PCR: NOT DETECTED

## 2018-04-23 LAB — CLOSTRIDIUM DIFFICILE CULTURE-FECAL

## 2018-04-24 ENCOUNTER — Encounter: Payer: Self-pay | Admitting: Gastroenterology

## 2018-04-28 ENCOUNTER — Other Ambulatory Visit: Payer: Self-pay | Admitting: Internal Medicine

## 2018-04-29 ENCOUNTER — Ambulatory Visit: Payer: BC Managed Care – PPO | Admitting: Internal Medicine

## 2018-04-29 MED ORDER — INSULIN PEN NEEDLE 31G X 5 MM MISC: 1.0000 "application " | Freq: Three times a day (TID) | 2 refills | Status: DC

## 2018-05-05 ENCOUNTER — Other Ambulatory Visit: Payer: Self-pay | Admitting: Internal Medicine

## 2018-05-21 ENCOUNTER — Other Ambulatory Visit: Payer: Self-pay | Admitting: Internal Medicine

## 2018-05-22 NOTE — Telephone Encounter (Signed)
Refilled: 04/03/2018 Last OV: 04/19/2018 Next OV: 06/04/2018

## 2018-05-22 NOTE — Telephone Encounter (Signed)
Printed, signed and faxed.  

## 2018-05-28 ENCOUNTER — Other Ambulatory Visit: Payer: Self-pay | Admitting: Internal Medicine

## 2018-05-29 ENCOUNTER — Ambulatory Visit (INDEPENDENT_AMBULATORY_CARE_PROVIDER_SITE_OTHER): Payer: BC Managed Care – PPO

## 2018-05-29 DIAGNOSIS — N83202 Unspecified ovarian cyst, left side: Secondary | ICD-10-CM

## 2018-05-31 ENCOUNTER — Other Ambulatory Visit: Payer: Self-pay | Admitting: Internal Medicine

## 2018-06-03 ENCOUNTER — Ambulatory Visit: Payer: BC Managed Care – PPO | Admitting: Internal Medicine

## 2018-06-03 ENCOUNTER — Other Ambulatory Visit: Payer: Self-pay | Admitting: Internal Medicine

## 2018-06-04 ENCOUNTER — Ambulatory Visit: Payer: BC Managed Care – PPO | Admitting: Internal Medicine

## 2018-06-04 ENCOUNTER — Encounter: Payer: Self-pay | Admitting: Internal Medicine

## 2018-06-04 VITALS — BP 120/66 | HR 129 | Temp 98.4°F | Resp 15 | Ht 64.0 in | Wt 257.4 lb

## 2018-06-04 DIAGNOSIS — R000 Tachycardia, unspecified: Secondary | ICD-10-CM | POA: Diagnosis not present

## 2018-06-04 DIAGNOSIS — G8929 Other chronic pain: Secondary | ICD-10-CM

## 2018-06-04 DIAGNOSIS — D509 Iron deficiency anemia, unspecified: Secondary | ICD-10-CM | POA: Diagnosis not present

## 2018-06-04 DIAGNOSIS — D508 Other iron deficiency anemias: Secondary | ICD-10-CM | POA: Diagnosis not present

## 2018-06-04 DIAGNOSIS — G894 Chronic pain syndrome: Secondary | ICD-10-CM | POA: Diagnosis not present

## 2018-06-04 DIAGNOSIS — E11649 Type 2 diabetes mellitus with hypoglycemia without coma: Secondary | ICD-10-CM | POA: Diagnosis not present

## 2018-06-04 DIAGNOSIS — I951 Orthostatic hypotension: Secondary | ICD-10-CM

## 2018-06-04 MED ORDER — DILTIAZEM HCL ER COATED BEADS 120 MG PO CP24: 120.0000 mg | ORAL_CAPSULE | Freq: Every day | ORAL | 2 refills | Status: DC

## 2018-06-04 MED ORDER — INSULIN NPH (HUMAN) (ISOPHANE) 100 UNIT/ML ~~LOC~~ SUSP: SUBCUTANEOUS | 11 refills | Status: DC

## 2018-06-04 NOTE — Progress Notes (Signed)
Subjective:  Patient ID: Maria Bentley, female    DOB: November 26, 1971  Age: 47 y.o. MRN: [id number]  CC: The primary encounter diagnosis was Orthostasis. Diagnoses of Chronic tachycardia, Uncontrolled type 2 diabetes mellitus with hypoglycemia without coma (HCC), Chronic pain syndrome, Iron deficiency anemia secondary to inadequate dietary iron intake, Inappropriate sinus tachycardia, and Other chronic pain were also pertinent to this visit.  HPI Maria Bentley presents for 1 month follow up on multiple issues ;  1) UNCONTROLLED DIABETES:  Sugars running high since she received steroid injection last week  In lumbar spine (2 ESI)   Using the Freestyle Libre sensor to manage sugars.  Highest in the morning  Wants to see Deirdre Pippins . Currently taking Novolin N 65 units in the evening  And15 units of Novolog with dinner.    20 units base Novolog with breakfast and sliding scale.  By early afternoon sugars have improved and range from 95 to 130.  Secondary to DJD shoulders,  DDD spine and fibromyalgia,  Pain clinic   Patient is following a low glycemic index diet and taking all prescribed medications regularly without side effects.  Fasting sugars have NOT BEEN UNDER 150 YET .  She is eating irregularly and did not bring her blood sugar log with her today .  She is not intentionally trying to lose weight .  Patient has had an eye exam in the last 12 months and checks feet regularly for signs of infection.  Patient does not walk barefoot outside,    2) Tachycardia/orthostasis:   Patient has been feeling orthostatic for years on and off and has stopped taking metoprolol and lisinopril for the last 2 months with some improvement.  No close calls.  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 Were stopped then and no more syncope.   Resting heart rate 100 after stopping metoprolol  .  Water intake varies from day to day , from 40 ounces to 60 ounces.  Caffeine intake is minimal  But drinks mountain dew occasionally . Takes Topomax for the past , resumed last week, at 25 mg     3) She has Chronic pain affecting both shoulders,  (right greater than left) at the scalene insertion .Has been seeing Cindi Carbon  Who referred her to [facility] for lumbar spine pain    Cervical spine and lumbar spine /pelvis. She also has fibromyalgia.   Pain management referral requested.  she saw [facility]   Last week for lumbar ESI .  She has no  history of cervical or lumbar spine surgery . Sees a Restaurant manager, fast food.   History  of rotator cuff surgery  in 2008 on right shoulder relieved pain for several years.  Has been more painful for the past  sevral months,  Does not want to use the oxycodone prescribed by neurology at #60/month for migraine headaches but is using it as needed one rx lasts 2-3  Months   Not tolerating iron due to nausea .  Has had to have iv iron in the past     Requesting referral to nutrtitionist and diabetes education   Outpatient Medications Prior to Visit  Medication Sig Dispense Refill  . acetaminophen (TYLENOL) 325 MG tablet Take by mouth.    . AJOVY 225 MG/1.5ML SOSY INJECT 1.5 ML EVERY MONTH BY SUBCUTANEOUS ROUTE.  12  . atorvastatin (LIPITOR) 20 MG tablet TAKE  1 TABLET BY MOUTH EVERY DAY 90 tablet 1  . B-D INS SYR MICROFINE 1CC/28G 28G X 1/2" 1 ML MISC USE 1 SYRINGE DAILY AFTER SUPPER 100 each 1  . B-D INS SYR MICROFINE 1CC/28G 28G X 1/2" 1 ML MISC USE 1 SYRINGE DAILY AFTER SUPPER 100 each 3  . BD INTEGRA SYRINGE 25G X 1" 3 ML MISC FOR USE WITH B12 INJECTIONS WEEKLY/MONTHLY 25 each 0  . blood glucose meter kit and supplies KIT Dispense based on patient and insurance preference. Use up to four times daily as directed. (FOR ICD-E11.21) 1 each 0  . buPROPion (WELLBUTRIN XL) 150 MG 24 hr tablet TAKE 3 TABLETS BY MOUTH IN THE MORNING  3  . buPROPion (WELLBUTRIN XL) 300 MG 24 hr  tablet Take 450 mg by mouth daily.     . Continuous Blood Gluc Receiver (FREESTYLE LIBRE 14 DAY READER) DEVI 1 applicator by Does not apply route 4 (four) times daily. 1 Device 1  . Continuous Blood Gluc Sensor (FREESTYLE LIBRE 14 DAY SENSOR) MISC USE TO TEST BLOOD SUGAR 4 TIMES DAILY. CHANGING EVERY 14 DAYS 1 each 1  . cyanocobalamin (,VITAMIN B-12,) 1000 MCG/ML injection Inject 1 mL (1,000 mcg total) into the muscle once a week. For 4 weeks, then monthly thereafter 10 mL 2  . cyclobenzaprine (FLEXERIL) 10 MG tablet TAKE 1/2 TO 1 UP TO THREE TIMES DAILY AS NEEDED. DROWSINESS PRECAUTIONS  12  . docusate sodium (COLACE) 100 MG capsule Take 1 capsule (100 mg total) by mouth 2 (two) times daily as needed for mild constipation. 10 capsule 0  . EPINEPHrine 0.3 mg/0.3 mL IJ SOAJ injection     . escitalopram (LEXAPRO) 20 MG tablet Take 20 mg by mouth daily.     . fluticasone (FLONASE) 50 MCG/ACT nasal spray SPRAY 2 SPRAYS INTO EACH NOSTRIL EVERY DAY 16 g 0  . glucose blood test strip Test blood sugar 5 times daily 450 each 1  . insulin NPH Human (NOVOLIN N) 100 UNIT/ML injection Inject 0.35 mLs (35 Units total) into the skin 2 (two) times daily before a meal. 10 mL 11  . Insulin Pen Needle (PEN NEEDLES) 31G X 6 MM MISC For use with victoza /saxenda 100 each 0  . Insulin Pen Needle 31G X 5 MM MISC 1 application by Does not apply route 3 (three) times daily. 100 each 2  . Insulin Syringe-Needle U-100 (B-D INS SYR ULTRAFINE 1CC/31G) 31G X 5/16" 1 ML MISC 1 Syringe by Does not apply route daily. 100 each 0  . Lactulose 20 GM/30ML SOLN 30 ml every 4 hours until constipation is relieved 236 mL 3  . lamoTRIgine (LAMICTAL) 100 MG tablet Take 150 mg by mouth daily.     [name] Device MISC Test blood sugar two times daily 100 each 6  . lidocaine (XYLOCAINE) 2 % jelly Apply 1 application topically as needed. 30 mL 2  . LORazepam (ATIVAN) 0.5 MG tablet Take 0.5-1 mg by mouth daily as needed.     . meclizine  (ANTIVERT) 25 MG tablet Take 1 tablet (25 mg total) by mouth 3 (three) times daily as needed for dizziness. 90 tablet 6  . NOVOFINE 32G X 6 MM MISC USE WITH NOVOLOG FLEXPEN AS DIRECTED 100 each 0  . NOVOLOG FLEXPEN 100 UNIT/ML FlexPen INJECT 20 UNITS INTO THE SKIN 3 (THREE) TIMES DAILY WITH MEALS. 15 mL 2  . OnabotulinumtoxinA (BOTOX IJ) Inject as directed every 3 (three) months. For migraine    .  oxyCODONE-acetaminophen (PERCOCET) 10-325 MG tablet Take 1 tablet by mouth every 6 (six) hours as needed for pain.    . pregabalin (LYRICA) 300 MG capsule Take 600 mg by mouth at bedtime.     . promethazine (PHENERGAN) 25 MG tablet TAKE 1 TABLET (25 MG TOTAL) BY MOUTH EVERY 8 (EIGHT) HOURS AS NEEDED. 15 tablet 5  . SUMAtriptan (IMITREX) 100 MG tablet TAKE 1 TABLET BY MOUTH EVERY DAY AS NEEDED MAY REPEAT IN 2HRS IF HEADACHE PERSISTS. MAX 2 TABS/24 HR 9 tablet 0  . Topiramate ER (TROKENDI XR) 50 MG CP24 Take 1 capsule by mouth daily.    . traMADol (ULTRAM) 50 MG tablet Take 1 tablet (50 mg total) by mouth every 6 (six) hours as needed. 90 tablet 0  . traMADol (ULTRAM) 50 MG tablet Take 1 tablet (50 mg total) by mouth every 6 (six) hours as needed for moderate pain. 30 tablet 0  . traMADol (ULTRAM) 50 MG tablet TAKE 1 TABLET BY MOUTH EVERY 6 HOURS AS NEEDED 90 tablet 5  . VENTOLIN HFA 108 (90 BASE) MCG/ACT inhaler INHALE ONE PUFF BY MOUTH EVERY SIX HOURS AS NEEDED FOR WHEEZING  18 g 3  . ergocalciferol (DRISDOL) 50000 units capsule Take 1 capsule (50,000 Units total) by mouth once a week. 4 capsule 3  . metoprolol tartrate (LOPRESSOR) 25 MG tablet Take 1 tablet (25 mg total) by mouth 2 (two) times daily. (Patient not taking: Reported on 06/04/2018) 60 tablet 0  . fluconazole (DIFLUCAN) 150 MG tablet Take 1 tablet (150 mg total) by mouth daily. (Patient not taking: Reported on 06/04/2018) 2 tablet 0  . traMADol (ULTRAM) 50 MG tablet TABLET BY MOUTH EVERY 6 HOURS AS NEEDED (Patient not taking: Reported on  06/04/2018) 90 tablet 0   Facility-Administered Medications Prior to Visit  Medication Dose Route Frequency Provider Last Rate Last Dose  . betamethasone acetate-betamethasone sodium phosphate (CELESTONE) injection 3 mg  3 mg Intramuscular Once Edrick Kins, DPM        Review of Systems;  Patient denies headache, fevers, malaise, unintentional weight loss, skin rash, eye pain, sinus congestion and sinus pain, sore throat, dysphagia,  hemoptysis , cough, dyspnea, wheezing, chest pain, palpitations, orthopnea, edema, abdominal pain, nausea, melena, diarrhea, constipation, flank pain, dysuria, hematuria, urinary  Frequency, nocturia, numbness, tingling, seizures,  Focal weakness, Loss of consciousness,  Tremor, insomnia, depression, anxiety, and suicidal ideation.      Objective:  BP 120/66 (BP Location: Left Arm, Patient Position: Sitting, Cuff Size: Normal)   Pulse (!) 129   Temp 98.4 F (36.9 C) (Oral)   Resp 15   Ht '5\' 4"'$  (1.626 m)   Wt 257 lb 6.4 oz (116.8 kg)   LMP 05/09/2016   SpO2 94%   BMI 44.18 kg/m   BP Readings from Last 3 Encounters:  06/04/18 120/66  04/19/18 138/80  03/13/18 (!) 141/89    Wt Readings from Last 3 Encounters:  06/04/18 257 lb 6.4 oz (116.8 kg)  04/19/18 264 lb 3.2 oz (119.8 kg)  03/11/18 259 lb (117.5 kg)    General appearance: alert, cooperative and appears stated age Ears: normal TM's and external ear canals both ears Throat: lips, mucosa, and tongue normal; teeth and gums normal Neck: no adenopathy, no carotid bruit, supple, symmetrical, trachea midline and thyroid not enlarged, symmetric, no tenderness/mass/nodules Back: symmetric, no curvature. ROM normal. No CVA tenderness. Lungs: clear to auscultation bilaterally Heart: regular rate and rhythm, S1, S2 normal, no murmur, click, rub  or gallop Abdomen: soft, non-tender; bowel sounds normal; no masses,  no organomegaly Pulses: 2+ and symmetric Skin: Skin color, texture, turgor normal. No  rashes or lesions Lymph nodes: Cervical, supraclavicular, and axillary nodes normal.  Lab Results  Component Value Date   HGBA1C 11.5 (H) 04/19/2018   HGBA1C 8.2 11/22/2017   HGBA1C 8.2 02/21/2017    Lab Results  Component Value Date   CREATININE 1.16 04/19/2018   CREATININE 1.22 (H) 03/13/2018   CREATININE 1.38 (H) 03/12/2018    Lab Results  Component Value Date   WBC 6.2 04/19/2018   HGB 10.3 (L) 04/19/2018   HCT 34.7 (L) 04/19/2018   PLT 576.0 (H) 04/19/2018   GLUCOSE 99 04/19/2018   CHOL 119 11/22/2017   TRIG 134.0 11/22/2017   HDL 49.90 11/22/2017   LDLDIRECT 92.0 02/21/2017   LDLCALC 42 11/22/2017   ALT 18 04/19/2018   AST 17 04/19/2018   NA 140 04/19/2018   K 4.0 04/19/2018   CL 102 04/19/2018   CREATININE 1.16 04/19/2018   BUN 20 04/19/2018   CO2 31 04/19/2018   TSH 1.69 11/22/2017   HGBA1C 11.5 (H) 04/19/2018   MICROALBUR <0.7 11/22/2017    No results found.  Assessment & Plan:   Problem List Items Addressed This Visit    Inappropriate sinus tachycardia    Prior workup in 2014.  Now orthostatic . Metoprolol stopped .Marland Kitchen  May have POTS . Referral to cardiology.  Starting cardizem      Relevant Medications   diltiazem (CARDIZEM CD) 120 MG 24 hr capsule   Diabetes mellitus type 2, uncontrolled ([place])    Uncontrolled due to lifestyle which contributes to continue NPH 65 units in the evening,. adding 10 units in the morning .   Diabetes is now wildly out of control despite her reports of normalized blood sugars and frequent monitoring using the [employer] monitor . Marland Kitchen  Referring to Gray Bernhardt for assistance in managing   Lab Results  Component Value Date   HGBA1C 11.5 (H) 04/19/2018         Relevant Medications   insulin NPH Human (HUMULIN N,NOVOLIN N) 100 UNIT/ML injection   Other Relevant Orders   Amb Referral to Nutrition and Diabetic E   Chronic pain    Secondary to DJD shoulders,  Cervical and lumbar spine.  Pain clinic referral made       Relevant Medications   cyclobenzaprine (FLEXERIL) 10 MG tablet   buPROPion (WELLBUTRIN XL) 150 MG 24 hr tablet   Other Relevant Orders   Ambulatory referral to Pain Clinic    Other Visit Diagnoses    Orthostasis    -  Primary   Relevant Medications   diltiazem (CARDIZEM CD) 120 MG 24 hr capsule   Other Relevant Orders   Ambulatory referral to Cardiology   Chronic tachycardia       Relevant Orders   Ambulatory referral to Cardiology   Iron deficiency anemia secondary to inadequate dietary iron intake       Relevant Orders   Ambulatory referral to Hematology      I have discontinued Derek Jack. Radman "Cindy"'s fluconazole. I am also having her start on diltiazem and insulin NPH Human. Additionally, I am having her maintain her Lancet Device, Insulin Syringe-Needle U-100, pregabalin, OnabotulinumtoxinA (BOTOX IJ), VENTOLIN HFA, lamoTRIgine, LORazepam, meclizine, buPROPion, oxyCODONE-acetaminophen, Lactulose, B-D INS SYR MICROFINE 1CC/28G, blood glucose meter kit and supplies, BD INTEGRA SYRINGE, EPINEPHrine, Pen Needles, SUMAtriptan, B-D INS SYR MICROFINE 1CC/28G, escitalopram,  acetaminophen, cyanocobalamin, FREESTYLE LIBRE 14 DAY READER, promethazine, lidocaine, glucose blood, Topiramate ER, traMADol, traMADol, metoprolol tartrate, docusate sodium, NOVOFINE, insulin NPH Human, Insulin Pen Needle, traMADol, FREESTYLE LIBRE 14 DAY SENSOR, atorvastatin, fluticasone, NOVOLOG FLEXPEN, cyclobenzaprine, AJOVY, and buPROPion. We will continue to administer betamethasone acetate-betamethasone sodium phosphate.  Meds ordered this encounter  Medications  . diltiazem (CARDIZEM CD) 120 MG 24 hr capsule    Sig: Take 1 capsule (120 mg total) by mouth daily.    Dispense:  30 capsule    Refill:  2  . insulin NPH Human (HUMULIN N,NOVOLIN N) 100 UNIT/ML injection    Sig: 65 units in the evening and 10 units in the morning.  Adjust as needed    Dispense:  10 mL    Refill:  11  A total of 40 minutes  was spent with patient more than half of which was spent in counseling patient on the above mentioned issues , reviewing and explaining recent labs and imaging studies done, and coordination of care.   Medications Discontinued During This Encounter  Medication Reason  . fluconazole (DIFLUCAN) 150 MG tablet Completed Course  . traMADol (ULTRAM) 50 MG tablet Duplicate    Follow-up: No follow-ups on file.   [name], MD

## 2018-06-04 NOTE — Patient Instructions (Addendum)
Start taking 10 units of N with breakfast .  Continue 65 units in the evening and continue your mealtime insulin   I am referring you to Deirdre Pippins (again) for help in adjusting your insulin ,  She is our pharm D  And your appt is Monday July 1 ay 9:00 am BRING YOUR BLOOD SUGAR LOGS  Referral to [facility] for evaluaiton of your chronic tachycardia and orthostasis.  Trial of low dose cardizem instead of metoprolol and lisinopril    Referral to pain clinic   Referral to [place] hematologist for iron infusions

## 2018-06-05 ENCOUNTER — Other Ambulatory Visit: Payer: Self-pay | Admitting: Internal Medicine

## 2018-06-05 DIAGNOSIS — D509 Iron deficiency anemia, unspecified: Secondary | ICD-10-CM | POA: Insufficient documentation

## 2018-06-05 DIAGNOSIS — G8929 Other chronic pain: Secondary | ICD-10-CM | POA: Insufficient documentation

## 2018-06-05 DIAGNOSIS — M797 Fibromyalgia: Secondary | ICD-10-CM | POA: Insufficient documentation

## 2018-06-05 NOTE — Assessment & Plan Note (Signed)
Prior workup in 2014.  Now orthostatic . Metoprolol stopped .Marland Kitchen  May have POTS . Referral to cardiology.  Starting cardizem

## 2018-06-05 NOTE — Assessment & Plan Note (Addendum)
Uncontrolled due to lifestyle which contributes to continue NPH 65 units in the evening,. adding 10 units in the morning .   Diabetes is now wildly out of control despite her reports of normalized blood sugars and frequent monitoring using the [employer] monitor . Marland Kitchen  Referring to Gray Bernhardt for assistance in managing   Lab Results  Component Value Date   HGBA1C 11.5 (H) 04/19/2018

## 2018-06-05 NOTE — Assessment & Plan Note (Signed)
Secondary to DJD shoulders,  Cervical and lumbar spine.  Pain clinic referral made

## 2018-06-05 NOTE — Assessment & Plan Note (Signed)
She is unable to tolerate oral iron due to GI upset.  Referral; to hematology for IV iron infusions  Lab Results  Component Value Date   WBC 6.2 04/19/2018   HGB 10.3 (L) 04/19/2018   HCT 34.7 (L) 04/19/2018   MCV 79.3 04/19/2018   PLT 576.0 (H) 04/19/2018

## 2018-06-10 ENCOUNTER — Ambulatory Visit (INDEPENDENT_AMBULATORY_CARE_PROVIDER_SITE_OTHER): Payer: BC Managed Care – PPO | Admitting: Pharmacist

## 2018-06-10 VITALS — BP 165/101 | HR 98 | Ht 64.0 in | Wt 248.0 lb

## 2018-06-10 DIAGNOSIS — E11649 Type 2 diabetes mellitus with hypoglycemia without coma: Secondary | ICD-10-CM | POA: Diagnosis not present

## 2018-06-10 MED ORDER — METFORMIN HCL ER 500 MG PO TB24: 2000.0000 mg | ORAL_TABLET | Freq: Every day | ORAL | 6 refills | Status: DC

## 2018-06-10 MED ORDER — INSULIN ASPART 100 UNIT/ML FLEXPEN: PEN_INJECTOR | SUBCUTANEOUS | 2 refills | Status: DC

## 2018-06-10 MED ORDER — INSULIN NPH (HUMAN) (ISOPHANE) 100 UNIT/ML ~~LOC~~ SUSP: SUBCUTANEOUS | 11 refills | Status: DC

## 2018-06-10 NOTE — Patient Instructions (Signed)
Nice to meet you! We are making changes to your diabetes regimen with the eventual goal to switch your long acting insulin (NPH) to an even longer acting version. We are also interested in adding another kidney-protective medicine in the future from the SGLT2 inhibitor class.   1. Change your insulin NPH - 40 units in the morning and 20 units in the evening 2. Change your novolog - 15 units with meals + 3-5 units per 50 points for blood sugars >200 mg/dL. If your blood sugar is less than 150 at meal time, only give 5 units with the meal.  3. Restart metformin XR 1000 mg (two 500 mg tablets) once a day with food x1 week. If tolerating, increase to 2000 mg (four 500 mg tablets) once daily.   Come back to see me in 4 weeks. We will call you in 2 weeks to see how your are doing.

## 2018-06-10 NOTE — Progress Notes (Signed)
S:     Chief Complaint  Patient presents with  . Medication Management    Diabetes    Patient arrives in good spirits.  Presents for diabetes evaluation, education, and management at the request of Dr. [name] (referred on 06/04/2018). Last seen by primary care provider on 06/04/2018 - at that time NPH was adjusted and patient was referred to pharmacy clinic. She notes that metformin was held at her most recent hospitalization due to renal dysfunction.   She notes that she enjoys her FreeStyle Libre meter and the immediate feedback it provides on her dietary choices. She endorses significant overnight hypoglycemic events.   Patient reports Diabetes was diagnosed at least 20 years ago. - Hx Victoza; significant nausea and appetite problems/difficulty eating - Hx Levemir while hospitalized; had red/swollen injections sites with large subdermal lumps - Hx glipizide - d/c d/t hypoglycemia  Insurance coverage/medication affordability: [place] - Using savings card for Trokendi XR  Patient reports adherence with medications.  Current diabetes medications include: NPH 10 units in the morning (prescribed but not taking), 65 units in the evening, Novolog 20 units with breakfast + 5 units per 50 mg/dL over 200; 15 units with lunch if BG >150 mg/dL and + 3 units per 50 points > 150 mg/dL; 20 units with supper + 5 units per 50 mg/dL over 200 Current hypertension medications include: diltiazem 120 mg (has not started yet, lost Rx and fear of orthostasis)  Patient reports hypoglycemic events. - She treats hypoglycemia with juice boxes + peanut butter sandwiches  Patient reported dietary habits:  Breakfast: Doesn't eat in the summer Lunch: [employer] fried fish, shrimp, and coleslaw Dinner: [place] - 1 tamale, 1 taco, 1/2 serving of chips + salsa Snacks: Occasionally apple, greek yogurt, cheezits Drinks: Diet soda, water  Patient reported exercise habits: None, limited by multiple  surgeries requiring rest to heal   Patient endorses nocturia 1-2 times/night  Patient denies pain/burning on urination.  Patient denies neuropathy. Patient denies visual changes. Patient reports self foot exams.   O:  Physical Exam  Constitutional: She appears well-developed.     Review of Systems  All other systems reviewed and are negative.    Lab Results  Component Value Date   HGBA1C 11.5 (H) 04/19/2018   Vitals:   06/11/18 1230  BP: (!) 165/101  Pulse: 98    Lipid Panel     Component Value Date/Time   CHOL 119 11/22/2017 1609   TRIG 134.0 11/22/2017 1609   HDL 49.90 11/22/2017 1609   CHOLHDL 2 11/22/2017 1609   VLDL 26.8 11/22/2017 1609   LDLCALC 42 11/22/2017 1609   LDLDIRECT 92.0 02/21/2017 1456        eGFR 03/13/18 - 52 GFR 04/19/18 - 53  Clinical ASCVD: No ; High-risk conditions:  h/o DM, HTN, CKD - Notes a longstanding history of orthostatic hypotension. Was to start diltiazem 120 mg after last PCP appointment, but has misplaced the bag from the pharmacy.  A/P: #Diabetes longstanding currently uncontrolled. Patient reports hypoglycemic events and is able to verbalize appropriate hypoglycemia management plan. Patient reports non-adherence with morning Novolin N administration. Control is suboptimal due to non-adherence, large burden of insulin in the evening contributing to hypoglycemia and resultant hyperglycemia. Control is likely to be improved by re-initiation of metformin. Renal function appropriate for re-trial. Due to increased insulin sensitization, pre-emptive insulin dose reduction is appropriate.  - Restart metformin XR at 1000 mg daily x 1 week then titrate  to 2000 mg daily. - Readjust insulin regimen: Novolin N 40 units in the morning and 20 units in the evening. Reduce Novolog to 15 units with meals + 3-5 units/50 mg/dL sliding scale for CBGs >200. If CBGs  Are <150 pre-meal, give 5 units with meal. - Next A1C anticipated 07/20/2018.     #ASCVD risk - primary prevention in patient aged 35-75 with DM. Patient on moderate intensity statin appropriately.  - Continued atorvastatin 20 mg daily   #Hypertension longstanding currently uncontrolled. Patient is not currently on an antihypertensive, but has been prescribed diltazem 120 mg daily. Control is suboptimal due to nonadherence, hx orthostatic hypotension limiting antihypertensive options. - Reinforced adherence to prescribed medications  Written patient instructions provided.  Total time in face to face counseling 60 minutes.    Follow up in Pharmacist Clinic Visit in 4 weeks, phone call check in 2 weeks .   Patient seen with Catie Darnelle Maffucci, PharmD  Carlean Jews, Pharm.D., BCPS, CPP PGY2 Ambulatory Care Pharmacy Resident Phone: [phone]

## 2018-06-11 ENCOUNTER — Encounter: Payer: Self-pay | Admitting: *Deleted

## 2018-06-11 NOTE — Assessment & Plan Note (Signed)
#  ASCVD risk - primary prevention in patient aged 48-75 with DM. Patient on moderate intensity statin appropriately.  - Continued atorvastatin 20 mg daily   #Hypertension longstanding currently uncontrolled. Patient is not currently on an antihypertensive, but has been prescribed diltazem 120 mg daily. Control is suboptimal due to nonadherence, hx orthostatic hypotension limiting antihypertensive options. - Reinforced adherence to prescribed medications

## 2018-06-12 ENCOUNTER — Telehealth: Payer: Self-pay | Admitting: Pharmacist

## 2018-06-12 NOTE — Telephone Encounter (Signed)
Called patient to f/u on CBGs since last insulin changes. No answer. LVM requesting she return my call.   Carlean Jews, Pharm.D., BCPS PGY2 Ambulatory Care Pharmacy Resident Phone: [phone]

## 2018-06-14 ENCOUNTER — Other Ambulatory Visit: Payer: Self-pay

## 2018-06-14 ENCOUNTER — Inpatient Hospital Stay: Payer: BC Managed Care – PPO | Attending: Oncology | Admitting: Oncology

## 2018-06-14 ENCOUNTER — Encounter: Payer: Self-pay | Admitting: Oncology

## 2018-06-14 ENCOUNTER — Inpatient Hospital Stay: Payer: BC Managed Care – PPO | Admitting: Oncology

## 2018-06-14 VITALS — BP 153/95 | HR 103 | Temp 98.2°F | Resp 16 | Ht 64.0 in | Wt 257.9 lb

## 2018-06-14 DIAGNOSIS — D473 Essential (hemorrhagic) thrombocythemia: Secondary | ICD-10-CM | POA: Insufficient documentation

## 2018-06-14 DIAGNOSIS — Z9884 Bariatric surgery status: Secondary | ICD-10-CM

## 2018-06-14 DIAGNOSIS — D508 Other iron deficiency anemias: Secondary | ICD-10-CM

## 2018-06-14 DIAGNOSIS — Z87891 Personal history of nicotine dependence: Secondary | ICD-10-CM

## 2018-06-14 DIAGNOSIS — D75839 Thrombocytosis, unspecified: Secondary | ICD-10-CM

## 2018-06-14 DIAGNOSIS — D509 Iron deficiency anemia, unspecified: Secondary | ICD-10-CM | POA: Diagnosis not present

## 2018-06-14 DIAGNOSIS — Z803 Family history of malignant neoplasm of breast: Secondary | ICD-10-CM

## 2018-06-14 NOTE — Progress Notes (Signed)
Patient here for initial visit for IDA she was seen in this clinic years ago by Dr. Cynda Acres. She reports occasional dyspnea with exertion., and fatigue.

## 2018-06-14 NOTE — Progress Notes (Signed)
Hematology/Oncology Consult note [facility] Telephone:([phone] [phone]   Patient Care Team: [name], MD as PCP - General (Internal Medicine)  REFERRING PROVIDER: [name], MD CHIEF COMPLAINTS/REASON FOR VISIT:  Evaluation of iron deficiency anemia  HISTORY OF PRESENTING ILLNESS:  Maria Bentley is a  47 y.o.  female with PMH listed below who was referred to me for evaluation of iron deficiency anemia. History of iron deficiency: Reports history of iron deficiency anemia.  Used to have IV iron done multiple times around 10 years ago.  Aggravated after her gastric bypass. Associated symptoms contacts listed as below. Rectal bleeding: Denies Menstrual bleeding/ Vaginal bleeding : Menopause Hematemesis or hemoptysis : denies Blood in urine : denies  Pica: Denies History of gastric bypass Fatigue: Yes.  SOB: deneis Reviewed labs that was ordered by primary care physician. 04/19/2018 patient has CBC done which showed hemoglobin 10.3, MCV 79.3.  WBC 6.2.  Platelet 5 76,000. 11/22/2017 Ferritin 8, iron saturation 6%.  TIBC 392 Patient reports that she cannot tolerate oral iron supplementation.  . Review of Systems  Constitutional: Positive for malaise/fatigue. Negative for chills, fever and weight loss.  HENT: Negative for congestion, ear discharge, ear pain, nosebleeds, sinus pain and sore throat.   Eyes: Negative for double vision, photophobia, pain, discharge and redness.  Respiratory: Negative for cough, hemoptysis, sputum production, shortness of breath and wheezing.   Cardiovascular: Negative for chest pain, palpitations, orthopnea, claudication and leg swelling.  Gastrointestinal: Negative for abdominal pain, blood in stool, constipation, diarrhea, heartburn, melena, nausea and vomiting.  Genitourinary: Negative for dysuria, flank pain, frequency and hematuria.  Musculoskeletal: Negative for back pain, myalgias and  neck pain.  Skin: Negative for itching and rash.  Neurological: Negative for dizziness, tingling, tremors, focal weakness, weakness and headaches.  Endo/Heme/Allergies: Negative for environmental allergies. Does not bruise/bleed easily.  Psychiatric/Behavioral: Negative for depression and hallucinations. The patient is not nervous/anxious.    MEDICAL HISTORY:  Past Medical History:  Diagnosis Date  . Anemia   . Anxiety   . Depression 2010  . Diabetes mellitus   . Endometriosis   . Headache, common migraine, intractable   . History of ovarian cyst    left sided, caused by her endometriosis (possibly an endometrioma), in her 80s.  Marland Kitchen Hyperlipidemia   . Hypertension   . Kidney stones    left  . Morbid obesity ([place])    s/p gastric bypass  . Personal hx of gastric bypass 2010  . Rosacea   . Vertigo     SURGICAL HISTORY: Past Surgical History:  Procedure Laterality Date  . DILATION AND CURETTAGE OF UTERUS  2011  . gastric bypass    . OVARIAN CYST REMOVAL    . ROTATOR CUFF REPAIR     right    SOCIAL HISTORY: Social History   Socioeconomic History  . Marital status: Married    Spouse name: Not on file  . Number of children: Not on file  . Years of education: Not on file  . Highest education level: Not on file  Occupational History  . Not on file  Social Needs  . Financial resource strain: Not on file  . Food insecurity:    Worry: Not on file    Inability: Not on file  . Transportation needs:    Medical: Not on file    Non-medical: Not on file  Tobacco Use  . Smoking status: Former Smoker    Packs/day: 1.00  Years: 4.00    Pack years: 4.00    Types: Cigarettes    Last attempt to quit: 02/05/1996    Years since quitting: 22.3  . Smokeless tobacco: Never Used  Substance and Sexual Activity  . Alcohol use: No  . Drug use: No  . Sexual activity: Yes    Birth control/protection: None  Lifestyle  . Physical activity:    Days per week: Not on file    Minutes  per session: Not on file  . Stress: Not on file  Relationships  . Social connections:    Talks on phone: Not on file    Gets together: Not on file    Attends religious service: Not on file    Active member of club or organization: Not on file    Attends meetings of clubs or organizations: Not on file    Relationship status: Not on file  . Intimate partner violence:    Fear of current or ex partner: Not on file    Emotionally abused: Not on file    Physically abused: Not on file    Forced sexual activity: Not on file  Other Topics Concern  . Not on file  Social History Narrative  . Not on file    FAMILY HISTORY: Family History  Problem Relation Age of Onset  . Diabetes Mother   . Hypertension Mother   . Hypertension Father   . Diabetes Father   . Depression Other        strong hx of  . Breast cancer Paternal Grandmother 30  . Cancer Neg Hx     ALLERGIES:  is allergic to amoxicillin; avocado; clarithromycin; honey; other; sulfa drugs cross reactors; watermelon concentrate; metoprolol; adhesive [tape]; levemir [insulin detemir]; and victoza [liraglutide].  MEDICATIONS:  Current Outpatient Medications  Medication Sig Dispense Refill  . acetaminophen (TYLENOL) 325 MG tablet Take by mouth.    . AJOVY 225 MG/1.5ML SOSY INJECT 1.5 ML EVERY MONTH BY SUBCUTANEOUS ROUTE.  12  . atorvastatin (LIPITOR) 20 MG tablet TAKE 1 TABLET BY MOUTH EVERY DAY 90 tablet 1  . azelastine (ASTELIN) 0.1 % nasal spray Place 1 spray into both nostrils 2 (two) times daily. Use in each nostril as directed    . B-D INS SYR MICROFINE 1CC/28G 28G X 1/2" 1 ML MISC USE 1 SYRINGE DAILY AFTER SUPPER 100 each 1  . B-D INS SYR MICROFINE 1CC/28G 28G X 1/2" 1 ML MISC USE 1 SYRINGE DAILY AFTER SUPPER 100 each 3  . BD INTEGRA SYRINGE 25G X 1" 3 ML MISC FOR USE WITH B12 INJECTIONS WEEKLY/MONTHLY 25 each 0  . blood glucose meter kit and supplies KIT Dispense based on patient and insurance preference. Use up to four  times daily as directed. (FOR ICD-E11.21) 1 each 0  . buPROPion (WELLBUTRIN XL) 150 MG 24 hr tablet TAKE 3 TABLETS BY MOUTH IN THE MORNING  3  . Continuous Blood Gluc Receiver (FREESTYLE LIBRE 14 DAY READER) DEVI 1 applicator by Does not apply route 4 (four) times daily. 1 Device 1  . Continuous Blood Gluc Sensor (FREESTYLE LIBRE 14 DAY SENSOR) MISC USE TO TEST BLOOD SUGAR 4 TIMES DAILY. CHANGING EVERY 14 DAYS 1 each 1  . cyanocobalamin (,VITAMIN B-12,) 1000 MCG/ML injection Inject 1 mL (1,000 mcg total) into the muscle once a week. For 4 weeks, then monthly thereafter 10 mL 2  . cyclobenzaprine (FLEXERIL) 10 MG tablet TAKE 1/2 TO 1 UP TO THREE TIMES DAILY AS NEEDED. DROWSINESS  PRECAUTIONS  12  . diltiazem (CARDIZEM CD) 120 MG 24 hr capsule Take 1 capsule (120 mg total) by mouth daily. 30 capsule 2  . docusate sodium (COLACE) 100 MG capsule Take 1 capsule (100 mg total) by mouth 2 (two) times daily as needed for mild constipation. 10 capsule 0  . EPINEPHrine 0.3 mg/0.3 mL IJ SOAJ injection     . escitalopram (LEXAPRO) 20 MG tablet Take 20 mg by mouth daily.     . fluticasone (FLONASE) 50 MCG/ACT nasal spray SPRAY 2 SPRAYS INTO EACH NOSTRIL EVERY DAY 16 g 0  . glucose blood test strip Test blood sugar 5 times daily 450 each 1  . insulin aspart (NOVOLOG FLEXPEN) 100 UNIT/ML FlexPen Inject 15 units + sliding scale under the skin with meals. Add 3-5 units for every blood sugar 50 mg/dL over 200. Max dose of 30 units with meals. 30 mL 2  . insulin NPH Human (NOVOLIN N) 100 UNIT/ML injection Inject 40 units under the skin in the morning and 20 units under the skin at night. 20 mL 11  . Insulin Pen Needle (PEN NEEDLES) 31G X 6 MM MISC For use with victoza /saxenda 100 each 0  . Insulin Pen Needle 31G X 5 MM MISC 1 application by Does not apply route 3 (three) times daily. 100 each 2  . Insulin Syringe-Needle U-100 (B-D INS SYR ULTRAFINE 1CC/31G) 31G X 5/16" 1 ML MISC 1 Syringe by Does not apply route daily.  100 each 0  . Lactulose 20 GM/30ML SOLN 30 ml every 4 hours until constipation is relieved 236 mL 3  . lamoTRIgine (LAMICTAL) 100 MG tablet Take 150 mg by mouth daily.     [name] Device MISC Test blood sugar two times daily 100 each 6  . LORazepam (ATIVAN) 0.5 MG tablet Take 0.5-1 mg by mouth daily as needed.     . meclizine (ANTIVERT) 25 MG tablet Take 1 tablet (25 mg total) by mouth 3 (three) times daily as needed for dizziness. 90 tablet 6  . metFORMIN (GLUCOPHAGE XR) 500 MG 24 hr tablet Take 4 tablets (2,000 mg total) by mouth daily with breakfast. 120 tablet 6  . NOVOFINE 32G X 6 MM MISC USE WITH NOVOLOG FLEXPEN AS DIRECTED 100 each 0  . OnabotulinumtoxinA (BOTOX IJ) Inject as directed every 3 (three) months. For migraine    . oxyCODONE-acetaminophen (PERCOCET) 10-325 MG tablet Take 1 tablet by mouth every 6 (six) hours as needed for pain.    . pregabalin (LYRICA) 50 MG capsule Taking no more than 650 mg in the day. Spacing out.    . promethazine (PHENERGAN) 25 MG tablet TAKE 1 TABLET (25 MG TOTAL) BY MOUTH EVERY 8 (EIGHT) HOURS AS NEEDED. 15 tablet 5  . SUMAtriptan (IMITREX) 100 MG tablet TAKE 1 TABLET BY MOUTH EVERY DAY AS NEEDED MAY REPEAT IN 2HRS IF HEADACHE PERSISTS. MAX 2 TABS/24 HR 9 tablet 0  . Topiramate ER (TROKENDI XR) 50 MG CP24 Take 1 capsule by mouth daily.    . traMADol (ULTRAM) 50 MG tablet Take 1 tablet (50 mg total) by mouth every 6 (six) hours as needed. 90 tablet 0  . traMADol (ULTRAM) 50 MG tablet Take 1 tablet (50 mg total) by mouth every 6 (six) hours as needed for moderate pain. 30 tablet 0  . traMADol (ULTRAM) 50 MG tablet TAKE 1 TABLET BY MOUTH EVERY 6 HOURS AS NEEDED 90 tablet 5  . VENTOLIN HFA 108 (90 BASE) MCG/ACT inhaler  INHALE ONE PUFF BY MOUTH EVERY SIX HOURS AS NEEDED FOR WHEEZING  18 g 3  . Vitamin D, Ergocalciferol, (DRISDOL) 50000 units CAPS capsule TAKE ONE CAPSULE BY MOUTH ONE TIME PER WEEK 12 capsule 1   No current facility-administered medications  for this visit.      PHYSICAL EXAMINATION: ECOG PERFORMANCE STATUS: 1 - Symptomatic but completely ambulatory Vitals:   06/14/18 0829 06/14/18 0835  BP:  (!) 153/95  Pulse:  (!) 103  Resp: 16   Temp:  98.2 F (36.8 C)   Filed Weights   06/14/18 0829  Weight: 257 lb 14.4 oz (117 kg)    Physical Exam  Constitutional: She is oriented to person, place, and time. She appears well-developed. No distress.  Morbid obese  HENT:  Head: Normocephalic and atraumatic.  Right Ear: External ear normal.  Left Ear: External ear normal.  Mouth/Throat: Oropharynx is clear and moist.  Eyes: Pupils are equal, round, and reactive to light. Conjunctivae and EOM are normal. No scleral icterus.  Neck: Normal range of motion. Neck supple.  Cardiovascular: Normal rate, regular rhythm and normal heart sounds.  Pulmonary/Chest: Effort normal and breath sounds normal. No respiratory distress. She has no wheezes. She has no rales. She exhibits no tenderness.  Abdominal: Soft. Bowel sounds are normal. She exhibits no distension and no mass. There is no tenderness.  Musculoskeletal: Normal range of motion. She exhibits no edema or deformity.  Lymphadenopathy:    She has no cervical adenopathy.  Neurological: She is alert and oriented to person, place, and time. No cranial nerve deficit. Coordination normal.  Skin: Skin is warm and dry. No rash noted.  Psychiatric: She has a normal mood and affect. Her behavior is normal. Thought content normal.     LABORATORY DATA:  I have reviewed the data as listed Lab Results  Component Value Date   WBC 6.2 04/19/2018   HGB 10.3 (L) 04/19/2018   HCT 34.7 (L) 04/19/2018   MCV 79.3 04/19/2018   PLT 576.0 (H) 04/19/2018   Recent Labs    03/11/18 1348 03/12/18 0422 03/13/18 0445 04/19/18 1228  NA 135 134* 138 140  K 3.8 4.3 4.0 4.0  CL 96* 99* 102 102  CO2 _0 GLUCOSE 219* 487* 356* 99  BUN _1 CREATININE 1.23* 1.38* 1.22* 1.16    CALCIUM 10.1 8.7* 8.9 8.5  GFRNONAA 51* 45* 52*  --   GFRAA 60* 52* >60  --   PROT 8.5* 6.9  --  6.7  ALBUMIN 3.8 3.1*  --  3.0*  AST 30 12*  --  17  ALT 20 16  --  18  ALKPHOS 129* 110  --  137*  BILITOT 0.5 0.8  --  0.2   Iron/TIBC/Ferritin/ %Sat    Component Value Date/Time   IRON 25 (L) 11/22/2017 1609   TIBC 392 11/22/2017 1609   FERRITIN 8 (L) 11/22/2017 1609   IRONPCTSAT 6 (L) 11/22/2017 1609        ASSESSMENT & PLAN:  1. Other iron deficiency anemia   2. H/O gastric bypass   3. Thrombocytosis ([place])    Labs were reviewed and discussed with patient.  Lab results are consistent with iron deficiency anemia. Patient has not been able to tolerate any p.o. iron supplementations so not taking it. I recommend IV iron with  Feraheme. Plan IV iron with Feraheme 510 mg weekly x2. Allergy reactions/infusion reaction including anaphylactic reaction discussed with patient.  Patient voices understanding and willing to proceed.  thrombocytosis most likely secondary to iron deficiency.  Hold additional work-up for now.  Anticipate improved after iron deficiency anemia is corrected. If persistent after correction, will trigger work-up.  Orders Placed This Encounter  Procedures  . CBC with Differential/Platelet    Standing Status:   Future    Standing Expiration Date:   06/15/2019  . Ferritin    Standing Status:   Future    Standing Expiration Date:   06/15/2019  . Iron and TIBC    Standing Status:   Future    Standing Expiration Date:   06/15/2019    All questions were answered. The patient knows to call the clinic with any problems questions or concerns.  Return of visit: Repeat CBC, ferritin, iron TIBC in 6 weeks prior to assessment for additional need for Feraheme. Thank you for this kind referral and the opportunity to participate in the care of this patient. A copy of today's note is routed to referring provider  Total face to face encounter time for this patient visit was 40  min. >50% of the time was  spent in counseling and coordination of care.    Earlie Server, MD, PhD Hematology Oncology [facility] at [facility] Pager- [phone] 06/14/2018

## 2018-06-16 NOTE — Progress Notes (Signed)
  I have reviewed the above information and agree with above.   Charina Fons, MD 

## 2018-06-20 ENCOUNTER — Inpatient Hospital Stay: Payer: BC Managed Care – PPO

## 2018-06-20 ENCOUNTER — Encounter: Payer: BC Managed Care – PPO | Admitting: Obstetrics and Gynecology

## 2018-06-20 VITALS — BP 127/84 | HR 111 | Temp 96.5°F | Resp 18

## 2018-06-20 DIAGNOSIS — D509 Iron deficiency anemia, unspecified: Secondary | ICD-10-CM | POA: Diagnosis not present

## 2018-06-20 DIAGNOSIS — D508 Other iron deficiency anemias: Secondary | ICD-10-CM

## 2018-06-20 MED ORDER — SODIUM CHLORIDE 0.9 % IV SOLN
Freq: Once | INTRAVENOUS | Status: AC
  Administered 2018-06-20: 14:00:00 via INTRAVENOUS
  Filled 2018-06-20: qty 1000

## 2018-06-20 MED ORDER — SODIUM CHLORIDE 0.9 % IV SOLN
510.0000 mg | Freq: Once | INTRAVENOUS | Status: AC
  Administered 2018-06-20: 510 mg via INTRAVENOUS
  Filled 2018-06-20: qty 17

## 2018-06-20 NOTE — Progress Notes (Signed)
Pt tolerated infusion well. Pt and VS stable at discharge.  

## 2018-06-21 ENCOUNTER — Ambulatory Visit: Payer: BC Managed Care – PPO

## 2018-06-28 ENCOUNTER — Inpatient Hospital Stay: Payer: BC Managed Care – PPO

## 2018-06-28 ENCOUNTER — Other Ambulatory Visit: Payer: Self-pay | Admitting: Internal Medicine

## 2018-06-28 VITALS — BP 131/84 | HR 111 | Resp 22

## 2018-06-28 DIAGNOSIS — D508 Other iron deficiency anemias: Secondary | ICD-10-CM

## 2018-06-28 DIAGNOSIS — D509 Iron deficiency anemia, unspecified: Secondary | ICD-10-CM | POA: Diagnosis not present

## 2018-06-28 MED ORDER — SODIUM CHLORIDE 0.9 % IV SOLN
510.0000 mg | Freq: Once | INTRAVENOUS | Status: AC
  Administered 2018-06-28: 510 mg via INTRAVENOUS
  Filled 2018-06-28: qty 17

## 2018-06-28 MED ORDER — SODIUM CHLORIDE 0.9 % IV SOLN
Freq: Once | INTRAVENOUS | Status: AC
  Administered 2018-06-28: 14:00:00 via INTRAVENOUS
  Filled 2018-06-28: qty 1000

## 2018-07-05 ENCOUNTER — Encounter: Payer: Self-pay | Admitting: Internal Medicine

## 2018-07-09 ENCOUNTER — Telehealth: Payer: Self-pay | Admitting: Internal Medicine

## 2018-07-10 ENCOUNTER — Encounter (INDEPENDENT_AMBULATORY_CARE_PROVIDER_SITE_OTHER): Payer: Self-pay

## 2018-07-22 ENCOUNTER — Telehealth: Payer: Self-pay | Admitting: Pharmacist

## 2018-07-22 ENCOUNTER — Ambulatory Visit: Payer: BC Managed Care – PPO | Admitting: Pharmacist

## 2018-07-22 NOTE — Telephone Encounter (Signed)
Patient missed our appointment scheduled for this morning.   Left HIPAA compliant voicemail for patient to call the clinic to reschedule with me at her earliest convenience.   Catie Darnelle Maffucci, PharmD PGY2 Ambulatory Care Pharmacy Resident Phone: [phone]

## 2018-07-24 ENCOUNTER — Inpatient Hospital Stay: Payer: BC Managed Care – PPO | Attending: Oncology

## 2018-07-24 DIAGNOSIS — D473 Essential (hemorrhagic) thrombocythemia: Secondary | ICD-10-CM | POA: Diagnosis not present

## 2018-07-24 DIAGNOSIS — D509 Iron deficiency anemia, unspecified: Secondary | ICD-10-CM | POA: Insufficient documentation

## 2018-07-24 DIAGNOSIS — D508 Other iron deficiency anemias: Secondary | ICD-10-CM

## 2018-07-24 LAB — IRON AND TIBC
Iron: 71 ug/dL (ref 28–170)
SATURATION RATIOS: 22 % (ref 10.4–31.8)
TIBC: 317 ug/dL (ref 250–450)
UIBC: 246 ug/dL

## 2018-07-24 LAB — CBC WITH DIFFERENTIAL/PLATELET
BASOS PCT: 1 %
Basophils Absolute: 0.1 10*3/uL (ref 0–0.1)
Eosinophils Absolute: 0.2 10*3/uL (ref 0–0.7)
Eosinophils Relative: 2 %
HEMATOCRIT: 44.2 % (ref 35.0–47.0)
HEMOGLOBIN: 13.8 g/dL (ref 12.0–16.0)
LYMPHS ABS: 1.7 10*3/uL (ref 1.0–3.6)
Lymphocytes Relative: 24 %
MCH: 28.1 pg (ref 26.0–34.0)
MCHC: 31.2 g/dL — AB (ref 32.0–36.0)
MCV: 89.8 fL (ref 80.0–100.0)
MONO ABS: 0.5 10*3/uL (ref 0.2–0.9)
MONOS PCT: 7 %
NEUTROS ABS: 4.8 10*3/uL (ref 1.4–6.5)
NEUTROS PCT: 66 %
Platelets: 334 10*3/uL (ref 150–440)
RBC: 4.93 MIL/uL (ref 3.80–5.20)
RDW: 24.6 % — ABNORMAL HIGH (ref 11.5–14.5)
WBC: 7.3 10*3/uL (ref 3.6–11.0)

## 2018-07-24 LAB — FERRITIN: FERRITIN: 93 ng/mL (ref 11–307)

## 2018-07-26 ENCOUNTER — Inpatient Hospital Stay: Payer: BC Managed Care – PPO | Admitting: Oncology

## 2018-07-26 ENCOUNTER — Inpatient Hospital Stay: Payer: BC Managed Care – PPO

## 2018-07-31 ENCOUNTER — Telehealth: Payer: Self-pay | Admitting: Internal Medicine

## 2018-07-31 NOTE — Telephone Encounter (Signed)
Left message for pt to return call to the office to verify which size needles were needed for insulin and B12 injections. Pt's current med profile has multiple syringe sizes.

## 2018-07-31 NOTE — Telephone Encounter (Signed)
Copied from [place] [id number]. Topic: Quick Communication - Rx Refill/Question >> Jul 31, 2018  5:00 PM Neva Seat wrote: Insulin Syringes B 12 Syringes - 90 day supply  Refills  [id number] [place] [address], [place] [address] [place] [address] Phone: [phone] Fax: [phone]

## 2018-08-01 ENCOUNTER — Other Ambulatory Visit: Payer: Self-pay | Admitting: Internal Medicine

## 2018-08-01 ENCOUNTER — Other Ambulatory Visit: Payer: Self-pay | Admitting: Obstetrics and Gynecology

## 2018-08-01 DIAGNOSIS — Z1231 Encounter for screening mammogram for malignant neoplasm of breast: Secondary | ICD-10-CM

## 2018-08-01 MED ORDER — "SYRINGE/NEEDLE (DISP) 25G X 1"" 3 ML MISC": 0 refills | Status: DC

## 2018-08-01 MED ORDER — "INSULIN SYRINGE/NEEDLE 28G X 1/2"" 1 ML MISC": 3 refills | Status: DC

## 2018-08-01 NOTE — Addendum Note (Signed)
Addended by: Adair Laundry on: 08/01/2018 02:47 PM   Modules accepted: Orders

## 2018-08-01 NOTE — Telephone Encounter (Signed)
Needle and syringes for both insulin and b12 injections have been sent in.

## 2018-08-02 ENCOUNTER — Telehealth: Payer: Self-pay

## 2018-08-02 NOTE — Telephone Encounter (Signed)
Copied from [place] [id number]. Topic: Inquiry >> Aug 01, 2018  9:19 AM Oliver Pila B wrote: Reason for CRM: pt called the office to schedule an appt w/ Catie Darnelle Maffucci; contact pt to advise

## 2018-08-07 ENCOUNTER — Other Ambulatory Visit: Payer: Self-pay | Admitting: Internal Medicine

## 2018-08-08 ENCOUNTER — Other Ambulatory Visit: Payer: Self-pay | Admitting: Internal Medicine

## 2018-08-19 ENCOUNTER — Encounter: Payer: Self-pay | Admitting: Pharmacist

## 2018-08-19 ENCOUNTER — Ambulatory Visit: Payer: BC Managed Care – PPO

## 2018-08-19 ENCOUNTER — Ambulatory Visit (INDEPENDENT_AMBULATORY_CARE_PROVIDER_SITE_OTHER): Payer: BC Managed Care – PPO | Admitting: Pharmacist

## 2018-08-19 ENCOUNTER — Other Ambulatory Visit: Payer: Self-pay | Admitting: Internal Medicine

## 2018-08-19 VITALS — BP 141/90 | HR 90 | Wt 261.4 lb

## 2018-08-19 DIAGNOSIS — E11649 Type 2 diabetes mellitus with hypoglycemia without coma: Secondary | ICD-10-CM | POA: Diagnosis not present

## 2018-08-19 LAB — POCT GLYCOSYLATED HEMOGLOBIN (HGB A1C): Hemoglobin A1C: 8.7 % — AB (ref 4.0–5.6)

## 2018-08-19 MED ORDER — INSULIN GLARGINE 100 UNIT/ML SOLOSTAR PEN: 50.0000 [IU] | PEN_INJECTOR | Freq: Every day | SUBCUTANEOUS | 99 refills | Status: DC

## 2018-08-19 MED ORDER — INSULIN ASPART 100 UNIT/ML FLEXPEN: PEN_INJECTOR | SUBCUTANEOUS | 2 refills | Status: DC

## 2018-08-19 MED ORDER — INSULIN PEN NEEDLE 32G X 6 MM MISC: 3 refills | Status: DC

## 2018-08-19 NOTE — Progress Notes (Signed)
S:     Chief Complaint  Patient presents with  . Medication Management    Diabetes    Patient arrives in good spirits.  Presents for diabetes evaluation, education, and management at the request of Dr. [name] (referred on 06/04/2018). At last Pharmacy clinic appointment, we restarted metformin and adjusted her Novolin/Novolog regimen.   Today, she notes that she has noticed a reduction in hypoglycemia with the insulin adjustment, but is still experiencing frequent hyperglycemia. She notes that she has only been taking metformin 1000 mg daily d/t misunderstanding instructions. She is interested in changing from Novolin to a true basal insulin today, per our discussion at our last Pharmacy appointment.  Of note, she has not taken her Cardizem today as she is shifting from AM to PM administration.  Patient reports Diabetes was diagnosed at least 20 years ago. - Hx Victoza; significant nausea and appetite problems/difficulty eating - Hx Levemir while hospitalized; had red/swollen injections sites with large subdermal lumps - Hx glipizide - d/c d/t hypoglycemia  Insurance coverage/medication affordability: [place]  Patient reports general adherence with medications.  Current diabetes medications include: Metformin 1000 mg daily, Novolin 40 units QAM and 20 units QPM; Novolog 15 units + 5 units additional for every 50 units over 150 mg/dL  Patient reports hypoglycemic s/sx including dizziness, shakiness, sweating; she remembers an SMBG of 73, one of 43 a few weeks ago. Patient reports hyperglycemic symptoms including polyuria, polyphagia (severe dry mouth), polyphagia, nocturia. Patient reports self foot exams.    O:  Physical Exam  Constitutional: She appears well-developed and well-nourished.  Vitals reviewed.   Review of Systems  All other systems reviewed and are negative.    Lab Results  Component Value Date   HGBA1C 11.5 (H) 04/19/2018   Vitals:   08/19/18 1531  BP:  (!) 141/90  Pulse: 90    Basic Metabolic Panel BMP Latest Ref Rng & Units 04/19/2018 03/13/2018 03/12/2018  Glucose 70 - 99 mg/dL 99 356(H) 487(H)  BUN 6 - 23 mg/dL _0 Creatinine 0.40 - 1.20 mg/dL 1.16 1.22(H) 1.38(H)  Sodium 135 - 145 mEq/L 140 138 134(L)  Potassium 3.5 - 5.1 mEq/L 4.0 4.0 4.3  Chloride 96 - 112 mEq/L 102 102 99(L)  CO2 19 - 32 mEq/L _1 Calcium 8.4 - 10.5 mg/dL 8.5 8.9 8.7(L)     Lipid Panel     Component Value Date/Time   CHOL 119 11/22/2017 1609   TRIG 134.0 11/22/2017 1609   HDL 49.90 11/22/2017 1609   CHOLHDL 2 11/22/2017 1609   VLDL 26.8 11/22/2017 1609   LDLCALC 42 11/22/2017 1609   LDLDIRECT 92.0 02/21/2017 1456    7 Day Average: 263 Above Target: 82% In Target 15% Below Target 3%  Clinical ASCVD: No ; High-risk conditions:  h/o DM, HTN, CKD  A/P: Following discussion and approval by Dr. [name], the following medication changes were made:   #Diabetes - Currently uncontrolled, most recent A1c 8.7% today, improved from 11.5% on 04/19/2018; Goal A1c <7%. Patient reports some hypoglycemic events. Patient reports adherence with medication. Appropriate to change Novolin to a true basal insulin to minimize risk of hypoglycemia and provide more consistent insulin release; Also appropriate to maximize metformin dose; eGFR appropriate. - Increase metformin from 1000 mg daily to 2000 mg daily - Discontinue Novolin NPH; start Lantus 50 units daily (~20% dose reduction d/t changing insulin formulation, metformin increase) - Continue Novolog at 15 units + 5 units  sliding scale for every 50 units over 150  - Next A1C anticipated 11/2018.     Written patient instructions provided.  Total time in face to face counseling 40 minutes.    Follow up in Pharmacist Clinic Visit 3-4 weeks.  De Hollingshead, PharmD PGY2 Ambulatory Care Pharmacy Resident Phone: [phone]

## 2018-08-19 NOTE — Assessment & Plan Note (Signed)
#  Diabetes - Currently uncontrolled, most recent A1c 8.7% today, improved from 11.5% on 04/19/2018; Goal A1c <7%. Patient reports some hypoglycemic events. Patient reports adherence with medication. Appropriate to change Novolin to a true basal insulin to minimize risk of hypoglycemia and provide more consistent insulin release; Also appropriate to maximize metformin dose; eGFR appropriate. - Increase metformin from 1000 mg daily to 2000 mg daily - Discontinue Novolin NPH; start Lantus 50 units daily (~20% dose reduction d/t changing insulin formulation, metformin increase) - Continue Novolog at 15 units + 5 units sliding scale for every 50 units over 150  - Next A1C anticipated 11/2018.

## 2018-08-19 NOTE — Patient Instructions (Signed)
It was great to see you today!   Changes for diabetes regimen:  1) Increase metformin to a total of 2000 mg daily 2) Stop Novolin NPH. Start Lantus 50 units once daily 3) Continue Novolog 15 units with meals, plus 5 units for every 50 mg/dL over 150 mg/dL  Keep scanning at least 4 times daily to ensure you download as much data as you can.    Contact the clinic if you start to experience blood sugars consistently less than   Schedule follow up with me in 4 weeks.

## 2018-08-22 NOTE — Progress Notes (Signed)
  I have reviewed the above information and agree with above.   Jessicah Croll, MD 

## 2018-08-22 NOTE — Telephone Encounter (Signed)
Don't see Basaglar in pt's current medication list.  Last OV: 06/10/2018 Next OV: not scheduled

## 2018-08-22 NOTE — Telephone Encounter (Signed)
Regimen was changed to Lantus 50 units daily on Sept 9 by Maria Bentley.  Do not refill basaglar

## 2018-08-23 ENCOUNTER — Ambulatory Visit (INDEPENDENT_AMBULATORY_CARE_PROVIDER_SITE_OTHER): Payer: BC Managed Care – PPO | Admitting: Obstetrics and Gynecology

## 2018-08-23 ENCOUNTER — Encounter: Payer: Self-pay | Admitting: Obstetrics and Gynecology

## 2018-08-23 VITALS — BP 146/86 | HR 120 | Ht 64.0 in | Wt 258.2 lb

## 2018-08-23 DIAGNOSIS — E11649 Type 2 diabetes mellitus with hypoglycemia without coma: Secondary | ICD-10-CM

## 2018-08-23 DIAGNOSIS — Z780 Asymptomatic menopausal state: Secondary | ICD-10-CM

## 2018-08-23 DIAGNOSIS — I10 Essential (primary) hypertension: Secondary | ICD-10-CM

## 2018-08-23 DIAGNOSIS — Z01419 Encounter for gynecological examination (general) (routine) without abnormal findings: Secondary | ICD-10-CM | POA: Diagnosis not present

## 2018-08-23 DIAGNOSIS — N83202 Unspecified ovarian cyst, left side: Secondary | ICD-10-CM

## 2018-08-23 NOTE — Progress Notes (Signed)
GYNECOLOGY ANNUAL PHYSICAL EXAM PROGRESS NOTE  Subjective:    Maria Bentley is a 47 y.o. G0P0 menopausal female who presents for an annual exam. She is sexually active (although notes that it is not often). She denies post-menopausal bleeding.  The patient has never been on hormonal therapy. The patient wears seatbelts: yes. The patient participates in regular exercise: no. Has the patient ever been transfused or tattooed?: no. She reports that there is not domestic violence in her life.    The patient has the following complaints/concerns today:  1. None  Gynecologic History  Menarche age: 64 Patient's last menstrual period was 05/09/2016. Contraception: post menopausal status History of STI's: Denies Last Pap:06/2017. Results were: normal.  Denies h/o abnormal pap smears. Last mammogram: 07/2017.  Results were: normal.   OB History  Gravida Para Term Preterm AB Living  0 0 0 0 0 0  SAB TAB Ectopic Multiple Live Births  0 0 0 0 0    Past Medical History:  Diagnosis Date  . Anemia   . Anxiety   . Depression 2010  . Diabetes mellitus   . Endometriosis   . Headache, common migraine, intractable   . History of ovarian cyst    left sided, caused by her endometriosis (possibly an endometrioma), in her 28s.  Marland Kitchen Hyperlipidemia   . Hypertension   . Kidney stones    left  . Morbid obesity ([place])    s/p gastric bypass  . Personal hx of gastric bypass 2010  . Rosacea   . Vertigo     Past Surgical History:  Procedure Laterality Date  . DILATION AND CURETTAGE OF UTERUS  2011  . gastric bypass    . OVARIAN CYST REMOVAL    . ROTATOR CUFF REPAIR     right    Family History  Problem Relation Age of Onset  . Diabetes Mother   . Hypertension Mother   . Hypertension Father   . Diabetes Father   . Depression Other        strong hx of  . Breast cancer Paternal Grandmother 74  . Cancer Neg Hx     Social History   Socioeconomic History  . Marital status:  Married    Spouse name: Not on file  . Number of children: Not on file  . Years of education: Not on file  . Highest education level: Not on file  Occupational History  . Not on file  Social Needs  . Financial resource strain: Not on file  . Food insecurity:    Worry: Not on file    Inability: Not on file  . Transportation needs:    Medical: Not on file    Non-medical: Not on file  Tobacco Use  . Smoking status: Former Smoker    Packs/day: 1.00    Years: 4.00    Pack years: 4.00    Types: Cigarettes    Last attempt to quit: 02/05/1996    Years since quitting: 22.5  . Smokeless tobacco: Never Used  Substance and Sexual Activity  . Alcohol use: No  . Drug use: No  . Sexual activity: Yes    Birth control/protection: None  Lifestyle  . Physical activity:    Days per week: Not on file    Minutes per session: Not on file  . Stress: Not on file  Relationships  . Social connections:    Talks on phone: Not on file    Gets together: Not on  file    Attends religious service: Not on file    Active member of club or organization: Not on file    Attends meetings of clubs or organizations: Not on file    Relationship status: Not on file  . Intimate partner violence:    Fear of current or ex partner: Not on file    Emotionally abused: Not on file    Physically abused: Not on file    Forced sexual activity: Not on file  Other Topics Concern  . Not on file  Social History Narrative  . Not on file    Current Outpatient Medications on File Prior to Visit  Medication Sig Dispense Refill  . acetaminophen (TYLENOL) 325 MG tablet Take by mouth.    Marland Kitchen atorvastatin (LIPITOR) 20 MG tablet TAKE 1 TABLET BY MOUTH EVERY DAY 90 tablet 1  . azelastine (ASTELIN) 0.1 % nasal spray Place 1 spray into both nostrils 2 (two) times daily. Use in each nostril as directed    . B-D INS SYR MICROFINE 1CC/28G 28G X 1/2" 1 ML MISC USE 1 SYRINGE DAILY AFTER SUPPER 100 each 1  . blood glucose meter kit and  supplies KIT Dispense based on patient and insurance preference. Use up to four times daily as directed. (FOR ICD-E11.21) 1 each 0  . buPROPion (WELLBUTRIN XL) 150 MG 24 hr tablet TAKE 3 TABLETS BY MOUTH IN THE MORNING  3  . Continuous Blood Gluc Receiver (FREESTYLE LIBRE 14 DAY READER) DEVI 1 applicator by Does not apply route 4 (four) times daily. 1 Device 1  . Continuous Blood Gluc Sensor (FREESTYLE LIBRE 14 DAY SENSOR) MISC USE TO TEST BLOOD SUGAR 4 TIMES DAILY. CHANGING EVERY 14 DAYS 2 each 2  . cyanocobalamin (,VITAMIN B-12,) 1000 MCG/ML injection Inject 1 mL (1,000 mcg total) into the muscle once a week. For 4 weeks, then monthly thereafter 10 mL 2  . cyclobenzaprine (FLEXERIL) 10 MG tablet TAKE 1/2 TO 1 UP TO THREE TIMES DAILY AS NEEDED. DROWSINESS PRECAUTIONS  12  . diltiazem (CARDIZEM CD) 120 MG 24 hr capsule Take 1 capsule (120 mg total) by mouth daily. 30 capsule 2  . docusate sodium (COLACE) 100 MG capsule Take 1 capsule (100 mg total) by mouth 2 (two) times daily as needed for mild constipation. 10 capsule 0  . EPINEPHrine 0.3 mg/0.3 mL IJ SOAJ injection     . escitalopram (LEXAPRO) 20 MG tablet Take 20 mg by mouth daily.     . fluticasone (FLONASE) 50 MCG/ACT nasal spray SPRAY 2 SPRAYS INTO EACH NOSTRIL EVERY DAY 16 g 0  . glucose blood test strip Test blood sugar 5 times daily 450 each 1  . INS SYRINGE/NEEDLE 1CC/28G (B-D INS SYR MICROFINE 1CC/28G) 28G X 1/2" 1 ML MISC USE 1 SYRINGE DAILY AFTER SUPPER 100 each 3  . insulin aspart (NOVOLOG FLEXPEN) 100 UNIT/ML FlexPen Inject 15 units + sliding scale under the skin with meals. Add 3-5 units for every blood sugar 50 mg/dL over 150. Max dose of 30 units with meals. 30 mL 2  . Insulin Glargine (LANTUS SOLOSTAR) 100 UNIT/ML Solostar Pen Inject 50 Units into the skin daily. 5 pen PRN  . Insulin Pen Needle (BD PEN NEEDLE NANO U/F) 32G X 4 MM MISC Please specify directions, refills and quantity 100 each 11  . Insulin Syringe-Needle U-100 (B-D  INS SYR ULTRAFINE 1CC/31G) 31G X 5/16" 1 ML MISC 1 Syringe by Does not apply route daily. 100 each 0  .  Lactulose 20 GM/30ML SOLN 30 ml every 4 hours until constipation is relieved 236 mL 3  . lamoTRIgine (LAMICTAL) 100 MG tablet Take 150 mg by mouth daily.     [name] Device MISC Test blood sugar two times daily 100 each 6  . LORazepam (ATIVAN) 0.5 MG tablet Take 0.5-1 mg by mouth daily as needed.     . meclizine (ANTIVERT) 25 MG tablet Take 1 tablet (25 mg total) by mouth 3 (three) times daily as needed for dizziness. 90 tablet 6  . metFORMIN (GLUCOPHAGE XR) 500 MG 24 hr tablet Take 4 tablets (2,000 mg total) by mouth daily with breakfast. 120 tablet 6  . OnabotulinumtoxinA (BOTOX IJ) Inject as directed every 3 (three) months. For migraine    . oxyCODONE-acetaminophen (PERCOCET) 10-325 MG tablet Take 1 tablet by mouth every 6 (six) hours as needed for pain.    . pregabalin (LYRICA) 50 MG capsule Taking no more than 650 mg in the day. Spacing out.    . promethazine (PHENERGAN) 25 MG tablet TAKE 1 TABLET (25 MG TOTAL) BY MOUTH EVERY 8 (EIGHT) HOURS AS NEEDED. (Patient not taking: Reported on 08/19/2018) 15 tablet 5  . SUMAtriptan (IMITREX) 100 MG tablet TAKE 1 TABLET BY MOUTH EVERY DAY AS NEEDED MAY REPEAT IN 2HRS IF HEADACHE PERSISTS. MAX 2 TABS/24 HR 9 tablet 0  . SYRINGE-NEEDLE, DISP, 3 ML (BD INTEGRA SYRINGE) 25G X 1" 3 ML MISC FOR USE WITH B12 INJECTIONS WEEKLY/MONTHLY 25 each 0  . traMADol (ULTRAM) 50 MG tablet Take 1 tablet (50 mg total) by mouth every 6 (six) hours as needed. 90 tablet 0  . VENTOLIN HFA 108 (90 BASE) MCG/ACT inhaler INHALE ONE PUFF BY MOUTH EVERY SIX HOURS AS NEEDED FOR WHEEZING  18 g 3  . Vitamin D, Ergocalciferol, (DRISDOL) 50000 units CAPS capsule TAKE ONE CAPSULE BY MOUTH ONE TIME PER WEEK 12 capsule 1   No current facility-administered medications on file prior to visit.     Allergies  Allergen Reactions  . Amoxicillin Hives and Rash  . Avocado Swelling  .  Clarithromycin Hives and Rash  . Honey Anaphylaxis  . Other Hives and Swelling    All melon  . Sulfa Drugs Cross Reactors Anaphylaxis  . Watermelon Concentrate Hives and Swelling    Oral swelling, ears swell shut  . Metoprolol Other (See Comments)    Hypotension  . Adhesive [Tape] Itching    Skin breakdown  . Levemir [Insulin Detemir] Rash    Red/swollen injection site reactions with residual lumps  . Victoza [Liraglutide] Nausea Only     Review of Systems Constitutional: negative for chills, fatigue, fevers and sweats Eyes: negative for irritation, redness and visual disturbance Ears, nose, mouth, throat, and face: negative for hearing loss, nasal congestion, snoring and tinnitus Respiratory: negative for asthma, cough, sputum Cardiovascular: negative for chest pain, dyspnea, exertional chest pressure/discomfort, irregular heart beat, palpitations and syncope Gastrointestinal: negative for abdominal pain, change in bowel habits, nausea and vomiting Genitourinary: negative for abnormal menstrual periods, genital lesions, sexual problems and vaginal discharge, dysuria and urinary incontinence.  Mild vulvar/vaginal irritation, occasionally.  Has a h/o skin yeast infections. Uses nystatin powder for prevention.  Integument/breast: negative for breast lump, breast tenderness and nipple discharge Hematologic/lymphatic: negative for bleeding and easy bruising Musculoskeletal:negative for back pain and muscle weakness Neurological: negative for dizziness, headaches, vertigo and weakness Endocrine: negative for diabetic symptoms including polydipsia, polyuria and skin dryness Allergic/Immunologic: negative for hay fever and urticaria  Objective:  Last menstrual period 05/09/2016. There is no height or weight on file to calculate BMI.  General Appearance:    Alert, cooperative, no distress, appears stated age, morbid obesity  Head:    Normocephalic, without obvious abnormality,  atraumatic  Eyes:    PERRL, conjunctiva/corneas clear, EOM's intact, both eyes  Ears:    Normal external ear canals, both ears  Nose:   Nares normal, septum midline, mucosa normal, no drainage or sinus tenderness  Throat:   Lips, mucosa, and tongue normal; teeth and gums normal  Neck:   Supple, symmetrical, trachea midline, no adenopathy; thyroid: no enlargement/tenderness/nodules; no carotid bruit or JVD  Back:     Symmetric, no curvature, ROM normal, no CVA tenderness  Lungs:     Clear to auscultation bilaterally, respirations unlabored  Chest Wall:    No tenderness or deformity   Heart:    Regular rate and rhythm, S1 and S2 normal, no murmur, rub or gallop  Breast Exam:    No tenderness, masses, or nipple abnormality  Abdomen:     Soft, non-tender, bowel sounds active all four quadrants, no masses, no organomegaly.    Genitalia:    Pelvic:external genitalia normal, vagina without lesions, discharge, or tenderness, rectovaginal septum  normal. Cervix normal in appearance, no cervical motion tenderness, no adnexal masses or tenderness.  Uterus normal size, shape, mobile, regular contours, nontender.  Rectal:    Normal external sphincter.  No hemorrhoids appreciated. Internal exam not done.   Extremities:   Extremities normal, atraumatic, no cyanosis or edema  Pulses:   2+ and symmetric all extremities  Skin:   Skin color, texture, turgor normal, no rashes or lesions  Lymph nodes:   Cervical, supraclavicular, and axillary nodes normal  Neurologic:   CNII-XII intact, normal strength, sensation and reflexes throughout   .  Labs:  Lab Results  Component Value Date   WBC 7.3 07/24/2018   HGB 13.8 07/24/2018   HCT 44.2 07/24/2018   MCV 89.8 07/24/2018   PLT 334 07/24/2018    Lab Results  Component Value Date   CREATININE 1.16 04/19/2018   BUN 20 04/19/2018   NA 140 04/19/2018   K 4.0 04/19/2018   CL 102 04/19/2018   CO2 31 04/19/2018    Lab Results  Component Value Date   ALT  18 04/19/2018   AST 17 04/19/2018   ALKPHOS 137 (H) 04/19/2018   BILITOT 0.2 04/19/2018    Lab Results  Component Value Date   TSH 1.69 11/22/2017    Lab Results  Component Value Date   CHOL 119 11/22/2017   HDL 49.90 11/22/2017   LDLCALC 42 11/22/2017   LDLDIRECT 92.0 02/21/2017   TRIG 134.0 11/22/2017   CHOLHDL 2 11/22/2017       Imaging:  US Transvaginal Non-OB ULTRASOUND REPORT  Location: [facility] Date of Service:  05/29/2018  Indications: F/U Left Ovarian Cyst Findings:  The uterus measures 5.9 x 3.4 x 3.2 cm. Echo texture is heterogeneous without evidence of focal masses. The Endometrium measures 6-7 mm.  Right Ovary measures 1.8 x 1.6 x 1.2 cm. It is normal in appearance. Left Ovary measures 3.9 x 3.2 x 3.1 cm and contains a cyst with a thin  internal septation measuring 3.1 x 2.0 x 2.9 cm.  Survey of the adnexa demonstrates no adnexal masses. There is no free fluid in the cul de sac.  Impression: 1. Retroflexed uterus appears heterogeneous with no obvious focal masses. 2. The  endometrium measures 6-7 mm. 3. Right ovary appears WNL. 4. Left ovary contains a cyst with a thin internal septation measuring 3.1  x 2.0 x 2.9 cm.  Recommendations: 1.Clinical correlation with the patient's History and Physical Exam.  Dario Ave, RDMS  I have reviewed this study and agree with documented findings.   Rubie Maid, MD [facility]   Assessment:   Healthy female exam.  Morbid obesity Post-menopausal state H/o ovarian cyst (left) Multiple comorbidites (DM, HTN)  Plan:     Blood tests: None needed. Completed with PCP.  Breast self exam technique reviewed and patient encouraged to perform self-exam monthly. Contraception: post menopausal status. Discussed healthy lifestyle modifications.  Has a h/o bariatric surgery in the past. Continued to encourage healthy diet and exercise.  Patient notes she plans to get a membership to her  local [employer].  Mammogram ordered. Pap smear up to date.  Continue routine screening.  Postmenopausal state, currently denies menopausal symptoms. Encouraged intake of Vitamin D and calcium.  Multiple comorbidities, managed by PCP.  Encouraged on scheduling eye exam.  Left ovarian cyst stable from 1 year ago, no growth or changes present. No further follow up needed unless symptoms develop.  Flu vaccine up to date.  Follow up in 1 year for annual exam, or as needed.      Rubie Maid, MD [facility]

## 2018-08-23 NOTE — Progress Notes (Signed)
PT is present today for her annual exam. Pt stated that she has been doing self-breast exams occassionally. Pt stated that she is she having some occassionally itching and burning but nothing major. No other problems or concerns.

## 2018-08-26 ENCOUNTER — Telehealth: Payer: Self-pay | Admitting: Pharmacist

## 2018-08-26 ENCOUNTER — Other Ambulatory Visit: Payer: Self-pay | Admitting: Pharmacist

## 2018-08-26 MED ORDER — BASAGLAR KWIKPEN 100 UNIT/ML ~~LOC~~ SOPN: 50.0000 [IU] | PEN_INJECTOR | Freq: Every day | SUBCUTANEOUS | 2 refills | Status: DC

## 2018-08-26 NOTE — Telephone Encounter (Signed)
Received communication from [facility] that Lantus is not covered by the patient's insurance, but Engineer, agricultural, Levemir, and Tyler Aas are.   Following discussion and approval by Dr. [name], the following medication changes were made:   Discontinuing order for Lantus, changing to Basaglar 50 units daily.   Contacted patient, informed that [place] prescription was sent to the pharmacy. She verbalized understanding.   Catie Darnelle Maffucci, PharmD PGY2 [facility] Pharm[facility] Resident Phone: [phone]

## 2018-08-28 ENCOUNTER — Other Ambulatory Visit: Payer: Self-pay | Admitting: Internal Medicine

## 2018-09-03 ENCOUNTER — Other Ambulatory Visit: Payer: Self-pay | Admitting: Internal Medicine

## 2018-09-17 ENCOUNTER — Other Ambulatory Visit: Payer: Self-pay | Admitting: Podiatry

## 2018-09-17 ENCOUNTER — Ambulatory Visit (INDEPENDENT_AMBULATORY_CARE_PROVIDER_SITE_OTHER): Payer: BC Managed Care – PPO | Admitting: Podiatry

## 2018-09-17 ENCOUNTER — Ambulatory Visit (INDEPENDENT_AMBULATORY_CARE_PROVIDER_SITE_OTHER): Payer: BC Managed Care – PPO

## 2018-09-17 ENCOUNTER — Encounter: Payer: Self-pay | Admitting: Podiatry

## 2018-09-17 DIAGNOSIS — M722 Plantar fascial fibromatosis: Secondary | ICD-10-CM

## 2018-09-17 DIAGNOSIS — M76821 Posterior tibial tendinitis, right leg: Secondary | ICD-10-CM | POA: Diagnosis not present

## 2018-09-17 DIAGNOSIS — M65271 Calcific tendinitis, right ankle and foot: Secondary | ICD-10-CM

## 2018-09-17 DIAGNOSIS — M659 Synovitis and tenosynovitis, unspecified: Secondary | ICD-10-CM | POA: Diagnosis not present

## 2018-09-18 NOTE — Progress Notes (Signed)
    HPI: 47 year old female presenting today for follow up evaluation of right foot and ankle pain. She states the pain started out laterally but is now located medially. Walking increases the pain. She has been using the ankle brace as directed. Patient is here for further evaluation and treatment.   Past Medical History:  Diagnosis Date  . Anemia   . Anxiety   . Depression 2010  . Diabetes mellitus   . Endometriosis   . Headache, common migraine, intractable   . History of ovarian cyst    left sided, caused by her endometriosis (possibly an endometrioma), in her 60s.  Marland Kitchen Hyperlipidemia   . Hypertension   . Kidney stones    left  . Morbid obesity ([place])    s/p gastric bypass  . Personal hx of gastric bypass 2010  . Rosacea   . Vertigo        Physical Exam: General: The patient is alert and oriented x3 in no acute distress.  Dermatology: Skin is warm, dry and supple bilateral lower extremities. Negative for open lesions or macerations.  Vascular: Palpable pedal pulses bilaterally. No edema or erythema noted. Capillary refill within normal limits.  Neurological: Epicritic and protective threshold grossly intact bilaterally.   Musculoskeletal Exam: Pain on palpation noted to the posterior tibial tendon of the right foot as well as the medial aspect of the right ankle joint. Range of motion within normal limits. Muscle strength 5/5 in all muscle groups bilateral lower extremities.  Radiographic Exam:  Normal osseous mineralization. Joint spaces preserved. No fracture or dislocation identified.    Assessment: 1. Insertional posterior tibial tendinitis right 2. Ankle synovitis right - medial    Plan of Care:  1. Patient was evaluated. Radiographs were reviewed today. 2. Injection of 0.5 mL Celestone Soluspan injected into the posterior tibial tendon sheath.  3. Injection of 0.5 mL Celestone Soluspan injected into the medial aspect of the right ankle joint.  4. Appointment  with Liliane Channel for custom molded orthotics.  5. New ankle brace dispensed.  6. Return to clinic in 4 weeks.    Edrick Kins, DPM [facility]  Dr. Edrick Kins, [address]                                        [place], [place] [address]                Office [phone]  Fax [phone]

## 2018-09-25 ENCOUNTER — Ambulatory Visit (INDEPENDENT_AMBULATORY_CARE_PROVIDER_SITE_OTHER): Payer: BC Managed Care – PPO | Admitting: Orthotics

## 2018-09-25 DIAGNOSIS — M778 Other enthesopathies, not elsewhere classified: Secondary | ICD-10-CM

## 2018-09-25 DIAGNOSIS — M779 Enthesopathy, unspecified: Secondary | ICD-10-CM | POA: Diagnosis not present

## 2018-09-25 DIAGNOSIS — M76821 Posterior tibial tendinitis, right leg: Secondary | ICD-10-CM

## 2018-09-25 DIAGNOSIS — M659 Synovitis and tenosynovitis, unspecified: Secondary | ICD-10-CM

## 2018-09-25 NOTE — Progress Notes (Signed)
Patient came into today to be cast for Custom Foot Orthotics. Upon recommendation of Dr. Ma Rings Patient presents with ankle synovitris Goals are to remove pressure at PT insertion navicular Plan vendor [employer]

## 2018-09-26 ENCOUNTER — Ambulatory Visit (INDEPENDENT_AMBULATORY_CARE_PROVIDER_SITE_OTHER): Payer: BC Managed Care – PPO | Admitting: Cardiovascular Disease

## 2018-09-26 ENCOUNTER — Encounter: Payer: Self-pay | Admitting: Cardiovascular Disease

## 2018-09-26 VITALS — BP 130/64 | HR 107 | Ht 64.0 in | Wt 260.0 lb

## 2018-09-26 DIAGNOSIS — R0602 Shortness of breath: Secondary | ICD-10-CM

## 2018-09-26 DIAGNOSIS — R000 Tachycardia, unspecified: Secondary | ICD-10-CM

## 2018-09-26 NOTE — Progress Notes (Signed)
Cardiology Office Note   Date:  09/26/2018   ID:  Maria Bentley, Maria Bentley Jun 29, 1971, MRN [id number]  PCP:  [name], MD  Cardiologist:   Kathlyn Sacramento, MD   Chief Complaint  Patient presents with  . other    Orthostatic BP and tachy. Meds reviewed verbally with pt.      History of Present Illness: Maria Bentley is a 47 y.o. female who was referred by Dr. [name] for evaluation of inappropriate sinus tachycardia. She was seen by me in 2014 for dyspnea and palpitations.  She was noted to have resting tachycardia in spite of taking Toprol 100 mg daily.  She had a 48-hour Holter monitor which showed normal sinus rhythm with no significant arrhythmia.  There was rare PACs and PVCs.  Echocardiogram at that time was completely normal.   She has chronic medical conditions that include diabetes mellitus type 2, morbid obesity, status post gastric bypass, and chronic daily headaches .  She also has been dealing with back pain recently which has significantly limited her activities.  Unfortunately, she gained weight gradually over the last few years. She reports having resting tachycardia all her life.  She came off metoprolol to take allergy shots and her symptoms worsened after that.  She was placed on lisinopril but that caused worsening tachycardia and orthostatic hypotension.  Symptoms did not improve after resuming metoprolol she was noted to be orthostatic in June.  At that time, she was started on diltiazem.  Since then, she reports significant improvement in symptoms.  She no longer has orthostatic dizziness.  She has no chest pain.  She has chronic exertional dyspnea with some worsening recently.   Past Medical History:  Diagnosis Date  . Anemia   . Anxiety   . Depression 2010  . Diabetes mellitus   . Endometriosis   . Headache, common migraine, intractable   . History of ovarian cyst    left sided, caused by her endometriosis (possibly an endometrioma), in her  49s.  Marland Kitchen Hyperlipidemia   . Hypertension   . Kidney stones    left  . Morbid obesity ([place])    s/p gastric bypass  . Personal hx of gastric bypass 2010  . Rosacea   . Vertigo     Past Surgical History:  Procedure Laterality Date  . DILATION AND CURETTAGE OF UTERUS  2011  . gastric bypass    . OVARIAN CYST REMOVAL    . ROTATOR CUFF REPAIR     right     Current Outpatient Medications  Medication Sig Dispense Refill  . acetaminophen (TYLENOL) 325 MG tablet Take by mouth.    Marland Kitchen atorvastatin (LIPITOR) 20 MG tablet TAKE 1 TABLET BY MOUTH EVERY DAY 90 tablet 1  . azelastine (ASTELIN) 0.1 % nasal spray Place 1 spray into both nostrils 2 (two) times daily. Use in each nostril as directed    . B-D INS SYR MICROFINE 1CC/28G 28G X 1/2" 1 ML MISC USE 1 SYRINGE DAILY AFTER SUPPER 100 each 1  . blood glucose meter kit and supplies KIT Dispense based on patient and insurance preference. Use up to four times daily as directed. (FOR ICD-E11.21) 1 each 0  . buPROPion (WELLBUTRIN XL) 150 MG 24 hr tablet TAKE 3 TABLETS BY MOUTH IN THE MORNING  3  . Continuous Blood Gluc Receiver (FREESTYLE LIBRE 14 DAY READER) DEVI 1 applicator by Does not apply route 4 (four) times daily. 1 Device 1  . Continuous  Blood Gluc Sensor (FREESTYLE LIBRE 14 DAY SENSOR) MISC USE TO TEST BLOOD SUGAR 4 TIMES DAILY. CHANGING EVERY 14 DAYS 2 each 2  . cyanocobalamin (,VITAMIN B-12,) 1000 MCG/ML injection Inject 1 mL (1,000 mcg total) into the muscle once a week. For 4 weeks, then monthly thereafter 10 mL 2  . cyclobenzaprine (FLEXERIL) 10 MG tablet TAKE 1/2 TO 1 UP TO THREE TIMES DAILY AS NEEDED. DROWSINESS PRECAUTIONS  12  . diltiazem (CARDIZEM CD) 120 MG 24 hr capsule TAKE 1 CAPSULE BY MOUTH EVERY DAY 90 capsule 0  . docusate sodium (COLACE) 100 MG capsule Take 1 capsule (100 mg total) by mouth 2 (two) times daily as needed for mild constipation. 10 capsule 0  . EPINEPHrine 0.3 mg/0.3 mL IJ SOAJ injection     . escitalopram  (LEXAPRO) 20 MG tablet Take 20 mg by mouth daily.     . INS SYRINGE/NEEDLE 1CC/28G (B-D INS SYR MICROFINE 1CC/28G) 28G X 1/2" 1 ML MISC USE 1 SYRINGE DAILY AFTER SUPPER 100 each 3  . insulin aspart (NOVOLOG FLEXPEN) 100 UNIT/ML FlexPen Inject 15 units + sliding scale under the skin with meals. Add 3-5 units for every blood sugar 50 mg/dL over 150. Max dose of 30 units with meals. 30 mL 2  . Insulin Glargine (BASAGLAR KWIKPEN) 100 UNIT/ML SOPN Inject 0.5 mLs (50 Units total) into the skin daily. 15 mL 2  . Insulin Pen Needle (BD PEN NEEDLE NANO U/F) 32G X 4 MM MISC Please specify directions, refills and quantity 100 each 11  . Insulin Syringe-Needle U-100 (B-D INS SYR ULTRAFINE 1CC/31G) 31G X 5/16" 1 ML MISC 1 Syringe by Does not apply route daily. 100 each 0  . Lactulose 20 GM/30ML SOLN 30 ml every 4 hours until constipation is relieved 236 mL 3  . lamoTRIgine (LAMICTAL) 100 MG tablet Take 150 mg by mouth daily.     [name] Device MISC Test blood sugar two times daily 100 each 6  . LORazepam (ATIVAN) 0.5 MG tablet Take 0.5-1 mg by mouth daily as needed.     . meclizine (ANTIVERT) 25 MG tablet Take 1 tablet (25 mg total) by mouth 3 (three) times daily as needed for dizziness. 90 tablet 6  . metFORMIN (GLUCOPHAGE XR) 500 MG 24 hr tablet Take 4 tablets (2,000 mg total) by mouth daily with breakfast. (Patient taking differently: Take 500 mg by mouth 3 (three) times daily. ) 120 tablet 6  . OnabotulinumtoxinA (BOTOX IJ) Inject as directed every 3 (three) months. For migraine    . ONETOUCH VERIO test strip TEST BLOOD SUGAR 5 TIMES DAILY 500 each 1  . oxyCODONE-acetaminophen (PERCOCET) 10-325 MG tablet Take 1 tablet by mouth every 6 (six) hours as needed for pain.    . pregabalin (LYRICA) 50 MG capsule Taking no more than 650 mg in the day. Spacing out.    . promethazine (PHENERGAN) 25 MG tablet TAKE 1 TABLET (25 MG TOTAL) BY MOUTH EVERY 8 (EIGHT) HOURS AS NEEDED. 15 tablet 5  . SUMAtriptan (IMITREX) 100  MG tablet TAKE 1 TABLET BY MOUTH EVERY DAY AS NEEDED MAY REPEAT IN 2HRS IF HEADACHE PERSISTS. MAX 2 TABS/24 HR 9 tablet 0  . SYRINGE-NEEDLE, DISP, 3 ML (BD INTEGRA SYRINGE) 25G X 1" 3 ML MISC FOR USE WITH B12 INJECTIONS WEEKLY/MONTHLY 25 each 0  . traMADol (ULTRAM) 50 MG tablet Take 1 tablet (50 mg total) by mouth every 6 (six) hours as needed. 90 tablet 0  . VENTOLIN HFA  108 (90 BASE) MCG/ACT inhaler INHALE ONE PUFF BY MOUTH EVERY SIX HOURS AS NEEDED FOR WHEEZING  18 g 3  . Vitamin D, Ergocalciferol, (DRISDOL) 50000 units CAPS capsule TAKE ONE CAPSULE BY MOUTH ONE TIME PER WEEK 12 capsule 1   No current facility-administered medications for this visit.     Allergies:   Amoxicillin; Avocado; Clarithromycin; Honey; Other; Sulfa drugs cross reactors; Watermelon concentrate; Metoprolol; Adhesive [tape]; Levemir [insulin detemir]; and Victoza [liraglutide]    Social History:  The patient  reports that she quit smoking about 22 years ago. Her smoking use included cigarettes. She has a 4.00 pack-year smoking history. She has never used smokeless tobacco. She reports that she does not drink alcohol or use drugs.   Family History:  The patient's family history includes Breast cancer (age of onset: 50) in her paternal grandmother; Depression in her other; Diabetes in her father and mother; Hypertension in her father and mother.    ROS:  Please see the history of present illness.   Otherwise, review of systems are positive for none.   All other systems are reviewed and negative.    PHYSICAL EXAM: VS:  BP 130/64 (BP Location: Right Arm, Patient Position: Sitting, Cuff Size: Normal)   Pulse (!) 107   Ht 5' 4"  (1.626 m)   Wt 260 lb (117.9 kg)   LMP 05/09/2016   BMI 44.63 kg/m  , BMI Body mass index is 44.63 kg/m. GEN: Well nourished, well developed, in no acute distress  HEENT: normal  Neck: no JVD, carotid bruits, or masses Cardiac: RRR; no murmurs, rubs, or gallops,no edema  Respiratory:  clear  to auscultation bilaterally, normal work of breathing GI: soft, nontender, nondistended, + BS MS: no deformity or atrophy  Skin: warm and dry, no rash Neuro:  Strength and sensation are intact Psych: euthymic mood, full affect   EKG:  EKG is ordered today. The ekg ordered today demonstrates sinus tachycardia with LVH   Recent Labs: 11/22/2017: TSH 1.69 04/19/2018: ALT 18; BUN 20; Creatinine, Ser 1.16; Magnesium 1.8; Potassium 4.0; Sodium 140 07/24/2018: Hemoglobin 13.8; Platelets 334    Lipid Panel    Component Value Date/Time   CHOL 119 11/22/2017 1609   TRIG 134.0 11/22/2017 1609   HDL 49.90 11/22/2017 1609   CHOLHDL 2 11/22/2017 1609   VLDL 26.8 11/22/2017 1609   LDLCALC 42 11/22/2017 1609   LDLDIRECT 92.0 02/21/2017 1456      Wt Readings from Last 3 Encounters:  09/26/18 260 lb (117.9 kg)  08/23/18 258 lb 3.2 oz (117.1 kg)  08/19/18 261 lb 6.4 oz (118.6 kg)        PAD Screen 09/26/2018  Previous PAD dx? No  Previous surgical procedure? No  Pain with walking? Yes  Subsides with rest? Yes  Feet/toe relief with dangling? No  Painful, non-healing ulcers? No  Extremities discolored? No      ASSESSMENT AND PLAN:  1.  Inappropriate sinus tachycardia: The patient reports significant improvement in symptoms since he was placed on diltiazem.  She is no longer orthostatic.  Given improvement in symptoms, I made no changes in her medication.  The dose of diltiazem can be increased in the future if needed for heart rate and blood pressure. I discussed with her the importance of increasing physical activities to improve overall cardiac conditioning.  I suggested water aerobics especially with all of the back and joint issues that she is dealing with.  I do think that progressive weight gain has  worsened her tachycardia.  I advised her to stay well-hydrated.  Avoid vasodilators or diuretics. There is also an off label use of ivabradine in this situation but this should be a  last resort.  2.  Exertional dyspnea: I requested an echocardiogram to ensure that LV systolic function is still normal in the setting of chronic tachycardia.   Disposition:   FU with me as needed  Signed,  Kathlyn Sacramento, MD  09/26/2018 3:25 PM    [facility]

## 2018-09-26 NOTE — Patient Instructions (Signed)
Medication Instructions:  No changes  If you need a refill on your cardiac medications before your next appointment, please call your pharmacy.   Lab work: None ordered  Testing/Procedures: Your physician has requested that you have an echocardiogram. Echocardiography is a painless test that uses sound waves to create images of your heart. It provides your doctor with information about the size and shape of your heart and how well your heart's chambers and valves are working. You may receive an ultrasound enhancing agent through an IV if needed to better visualize your heart during the echo.This procedure takes approximately one hour. There are no restrictions for this procedure. This will take place at the [facility].    Follow-Up: As needed with Dr. Fletcher Anon

## 2018-10-03 ENCOUNTER — Other Ambulatory Visit: Payer: BC Managed Care – PPO

## 2018-10-14 ENCOUNTER — Other Ambulatory Visit: Payer: Self-pay | Admitting: Internal Medicine

## 2018-10-22 ENCOUNTER — Ambulatory Visit: Payer: BC Managed Care – PPO | Admitting: Podiatry

## 2018-10-23 ENCOUNTER — Other Ambulatory Visit: Payer: BC Managed Care – PPO | Admitting: Orthotics

## 2018-10-24 ENCOUNTER — Ambulatory Visit: Payer: BC Managed Care – PPO | Admitting: Internal Medicine

## 2018-10-28 ENCOUNTER — Ambulatory Visit (INDEPENDENT_AMBULATORY_CARE_PROVIDER_SITE_OTHER): Payer: BC Managed Care – PPO | Admitting: Family Medicine

## 2018-10-28 ENCOUNTER — Encounter: Payer: Self-pay | Admitting: Family Medicine

## 2018-10-28 ENCOUNTER — Ambulatory Visit: Payer: Self-pay | Admitting: *Deleted

## 2018-10-28 VITALS — BP 142/88 | HR 104 | Temp 98.0°F | Ht 64.0 in | Wt 234.1 lb

## 2018-10-28 DIAGNOSIS — R05 Cough: Secondary | ICD-10-CM

## 2018-10-28 DIAGNOSIS — J988 Other specified respiratory disorders: Secondary | ICD-10-CM

## 2018-10-28 DIAGNOSIS — R062 Wheezing: Secondary | ICD-10-CM

## 2018-10-28 DIAGNOSIS — R197 Diarrhea, unspecified: Secondary | ICD-10-CM

## 2018-10-28 DIAGNOSIS — R058 Other specified cough: Secondary | ICD-10-CM

## 2018-10-28 MED ORDER — ALBUTEROL SULFATE HFA 108 (90 BASE) MCG/ACT IN AERS: 2.0000 | INHALATION_SPRAY | RESPIRATORY_TRACT | 3 refills | Status: DC | PRN

## 2018-10-28 MED ORDER — DOXYCYCLINE HYCLATE 100 MG PO TABS: 100.0000 mg | ORAL_TABLET | Freq: Two times a day (BID) | ORAL | 0 refills | Status: DC

## 2018-10-28 NOTE — Telephone Encounter (Signed)
   Reason for Disposition . [1] MILD difficulty breathing (e.g., minimal/no SOB at rest, SOB with walking, pulse <100) AND [2] NEW-onset or WORSE than normal    Patient states she has chest congestion that is causing SOB with exertion.  Answer Assessment - Initial Assessment Questions 1. RESPIRATORY STATUS: "Describe your breathing?" (e.g., wheezing, shortness of breath, unable to speak, severe coughing)      When patient gets up- she feels SOB 2. ONSET: "When did this breathing problem begin?"      1 weeks ago- started- congestion got in chest mid week 3. PATTERN "Does the difficult breathing come and go, or has it been constant since it started?"      Comes and goes- worse with exertion 4. SEVERITY: "How bad is your breathing?" (e.g., mild, moderate, severe)    - MILD: No SOB at rest, mild SOB with walking, speaks normally in sentences, can lay down, no retractions, pulse < 100.    - MODERATE: SOB at rest, SOB with minimal exertion and prefers to sit, cannot lie down flat, speaks in phrases, mild retractions, audible wheezing, pulse 100-120.    - SEVERE: Very SOB at rest, speaks in single words, struggling to breathe, sitting hunched forward, retractions, pulse > 120      mild 5. RECURRENT SYMPTOM: "Have you had difficulty breathing before?" If so, ask: "When was the last time?" and "What happened that time?"      Yes- patient was in hospital- septic- had IV antibiotics 6. CARDIAC HISTORY: "Do you have any history of heart disease?" (e.g., heart attack, angina, bypass surgery, angioplasty)      no 7. LUNG HISTORY: "Do you have any history of lung disease?"  (e.g., pulmonary embolus, asthma, emphysema)     no 8. CAUSE: "What do you think is causing the breathing problem?"      congestion 9. OTHER SYMPTOMS: "Do you have any other symptoms? (e.g., dizziness, runny nose, cough, chest pain, fever)     Runny nose, cough, fatigue 10. PREGNANCY: "Is there any chance you are pregnant?" "When was  your last menstrual period?"       n/a 11. TRAVEL: "Have you traveled out of the country in the last month?" (e.g., travel history, exposures)       n/a  Protocols used: BREATHING DIFFICULTY-A-AH

## 2018-10-28 NOTE — Telephone Encounter (Signed)
FYI

## 2018-10-28 NOTE — Progress Notes (Signed)
Subjective:    Patient ID: Maria Bentley, female    DOB: June 30, 1971, 47 y.o.   MRN: [id number]  HPI   Patient presents to clinic with cough, chest congestion, short of breath, feeling really rundown for the past week.  States cough is raspy and wet.  Has been using DayQuil and NyQuil, and Mucinex without much relief.  Patient also reports about 3 weeks ago she was in the emergency room for abdominal pain, it was discovered her gallbladder was full of stones and had to be removed right away.  While in ER patient was in septic shock, had to be on course of meropenem, patient states she finished meropenem about a week and a half ago.  She initially got off meropenem these cough/congestion symptoms began to develop.  Patient also reports she has had a few episodes of diarrhea today, also notes she has not been eating a ton, has been trying to do some bland foods but since she Is coughing and congested has not had much of appetite.  Does not know if she has had any fever at home.  Denies nausea or vomiting at this time.  Is coughing up thick dark yellow phlegm.  Patient Active Problem List   Diagnosis Date Noted  . Chronic pain 06/05/2018  . Iron deficiency anemia 06/05/2018  . Diarrhea of presumed infectious origin 04/21/2018  . Sepsis ([place]) 03/11/2018  . Paronychia of great toe of left foot 11/24/2017  . Hypertension 11/24/2017  . Pelvic mass in female 04/05/2017  . Tendonitis 03/30/2016  . Left ankle sprain 03/30/2016  . Peroneal tendonitis 01/13/2016  . Hip pain, chronic 12/28/2015  . Pain in joint, ankle and foot 12/28/2015  . Constipation 12/27/2015  . 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 Noncompliance with diabetes treatment 06/08/2015  . Right hip pain 05/30/2014  . Vitamin D deficiency 05/30/2014  . Tachycardia 08/15/2013  . Diabetes mellitus type 2, uncontrolled ([place]) 08/15/2013  . 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  . Dyspnea 07/15/2013  .  Morbid obesity ([place]) 06/09/2012  . Anxiety   . Headache, common migraine, intractable   . Hyperlipidemia   . OSA (obstructive sleep apnea) 02/05/2012   Social History   Tobacco Use  . Smoking status: Former Smoker    Packs/day: 1.00    Years: 4.00    Pack years: 4.00    Types: Cigarettes    Last attempt to quit: 02/05/1996    Years since quitting: 22.7  . Smokeless tobacco: Never Used  Substance Use Topics  . Alcohol use: No   Review of Systems  Constitutional: Negative for chills, fatigue and fever.  HENT: +congestion, ear pain, sinus pain    Eyes: Negative.   Respiratory: +cough, shortness of breath and wheezing.   Cardiovascular: Negative for chest pain, palpitations and leg swelling.  Gastrointestinal: +diarrhea. Negative for abdominal pain, nausea and vomiting.  Genitourinary: Negative for dysuria, frequency and urgency.  Musculoskeletal: Negative for arthralgias and myalgias.  Skin: Negative for color change, pallor and rash.  Neurological: Negative for syncope, light-headedness and headaches.  Psychiatric/Behavioral: The patient is not nervous/anxious.       Objective:   Physical Exam  Constitutional: She is oriented to person, place, and time.  Non-toxic appearance.  Neck: Neck supple. No JVD present.  Cardiovascular: Normal heart sounds. Tachycardia present.  Pulmonary/Chest: She has wheezes. She has rhonchi.  Diffuse wheezes and rhonchi, harsh wet cough  Abdominal: Soft. Bowel sounds are normal. There is no tenderness. There  is no rebound and no guarding.  Laparoscopic surgery scars healing well  Musculoskeletal:       Right lower leg: She exhibits no edema.       Left lower leg: She exhibits no edema.  Neurological: She is alert and oriented to person, place, and time.  Skin: Skin is warm and dry. She is not diaphoretic. No pallor.  Psychiatric: She has a normal mood and affect. Her behavior is normal.      Vitals:   10/28/18 1531  BP: (!) 142/88    Pulse: (!) 104  Temp: 98 F (36.7 C)  SpO2: 96%   Assessment & Plan:    Respiratory infection, productive cough, wheeze - patient will take doxycycline twice daily, and also use inhaler to open up lung sounds to help improve chest congestion and wheezing.  Patient advised to rest, do good handwashing, increase fluid intake.  Offered chest x-ray in clinic today, patient declines. Advised she may continue OTC mucinex also  Diarrhea- appears mild at this time, patient's abdominal exam otherwise unremarkable.  Patient advised she can use Imodium over-the-counter as needed to help improve diarrhea symptoms.  Patient also given handout outlining a bland diet, advised to do clear liquids for the next 12 to 24 hours, then slowly incorporate bland foods.  Keep regularly scheduled follow-up as planned.  Return to clinic sooner if current symptoms persist or worsen or if new things develop.

## 2018-10-28 NOTE — Patient Instructions (Signed)

## 2018-10-29 ENCOUNTER — Encounter: Payer: Self-pay | Admitting: Family Medicine

## 2018-11-12 ENCOUNTER — Encounter: Payer: Self-pay | Admitting: Cardiovascular Disease

## 2018-11-12 DIAGNOSIS — F32A Depression, unspecified: Secondary | ICD-10-CM | POA: Insufficient documentation

## 2018-11-12 DIAGNOSIS — M530 Cervicocranial syndrome: Secondary | ICD-10-CM | POA: Insufficient documentation

## 2018-11-15 ENCOUNTER — Other Ambulatory Visit: Payer: Self-pay | Admitting: Internal Medicine

## 2018-11-17 ENCOUNTER — Other Ambulatory Visit: Payer: Self-pay | Admitting: Internal Medicine

## 2018-11-18 NOTE — Telephone Encounter (Signed)
Not in current medication list.  Refilled: 02/06/2018 Last OV: 06/04/2018 Next OV: not scheduled

## 2018-11-19 ENCOUNTER — Ambulatory Visit (INDEPENDENT_AMBULATORY_CARE_PROVIDER_SITE_OTHER): Payer: BC Managed Care – PPO

## 2018-11-19 ENCOUNTER — Ambulatory Visit (INDEPENDENT_AMBULATORY_CARE_PROVIDER_SITE_OTHER): Payer: BC Managed Care – PPO | Admitting: Podiatry

## 2018-11-19 ENCOUNTER — Other Ambulatory Visit: Payer: Self-pay | Admitting: Internal Medicine

## 2018-11-19 ENCOUNTER — Encounter: Payer: Self-pay | Admitting: Podiatry

## 2018-11-19 DIAGNOSIS — S99921A Unspecified injury of right foot, initial encounter: Secondary | ICD-10-CM

## 2018-11-19 DIAGNOSIS — S99911A Unspecified injury of right ankle, initial encounter: Secondary | ICD-10-CM

## 2018-11-19 MED ORDER — MELOXICAM 15 MG PO TABS: 15.0000 mg | ORAL_TABLET | Freq: Every day | ORAL | 1 refills | Status: AC
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
## 2018-11-22 ENCOUNTER — Other Ambulatory Visit: Payer: Self-pay | Admitting: Internal Medicine

## 2018-11-29 ENCOUNTER — Other Ambulatory Visit: Payer: Self-pay | Admitting: Internal Medicine

## 2018-12-03 NOTE — Telephone Encounter (Signed)
Please notify patient that the prescription was   Refilled for 30 days only because they are controlled substances and require a  6 month follow up and  it has been 10 months since her last visit. Marland Kitchen  OFFICE VISIT NEEDED prior to any more refills

## 2018-12-17 ENCOUNTER — Encounter: Payer: Self-pay | Admitting: Podiatry

## 2018-12-17 ENCOUNTER — Ambulatory Visit: Payer: BC Managed Care – PPO | Admitting: Podiatry

## 2018-12-17 DIAGNOSIS — S99919D Unspecified injury of unspecified ankle, subsequent encounter: Secondary | ICD-10-CM | POA: Diagnosis not present

## 2018-12-17 DIAGNOSIS — S93601D Unspecified sprain of right foot, subsequent encounter: Secondary | ICD-10-CM

## 2018-12-19 ENCOUNTER — Telehealth: Payer: Self-pay | Admitting: Internal Medicine

## 2018-12-19 NOTE — Progress Notes (Signed)
   HPI: 48 year old female presenting today for follow up evaluation of a sprained right foot. She states she is improving slowly. She has been using the CAM boot and taking Meloxicam as directed. There are no modifying factors noted. Patient is here for further evaluation and treatment.   Past Medical History:  Diagnosis Date  . Anemia   . Anxiety   . Depression 2010  . Diabetes mellitus   . Endometriosis   . Headache, common migraine, intractable   . History of ovarian cyst    left sided, caused by her endometriosis (possibly an endometrioma), in her 3s.  Marland Kitchen Hyperlipidemia   . Hypertension   . Kidney stones    left  . Morbid obesity ([place])    s/p gastric bypass  . Personal hx of gastric bypass 2010  . Rosacea   . Vertigo      Physical Exam: General: The patient is alert and oriented x3 in no acute distress.  Dermatology: Skin is warm, dry and supple bilateral lower extremities. Negative for open lesions or macerations.  Vascular: Palpable pedal pulses bilaterally. Capillary refill within normal limits.  Neurological: Epicritic and protective threshold grossly intact bilaterally.   Musculoskeletal Exam: Ecchymosis and edema with pain on palpation of the right foot. Range of motion within normal limits to all pedal and ankle joints bilateral. Muscle strength 5/5 in all groups bilateral.   Assessment: 1. Right foot contusion/sprain - improved    Plan of Care:  1. Patient evaluated.  2. Ankle brace dispensed. Transition out of CAM boot.  3. Continue using ace wrap as needed in the evening.  4. Prescription for Tramadol 50 mg #60 provided to patient.  5. Return to clinic in 8 weeks.       Edrick Kins, DPM [facility]  Dr. Edrick Kins, DPM    [address], [place] [address]                Office [phone]  Fax [phone]

## 2018-12-20 NOTE — Telephone Encounter (Signed)
Refilled: 12/03/2018 Last OV: 06/04/2018 Next OV: not scheduled

## 2018-12-27 ENCOUNTER — Other Ambulatory Visit: Payer: Self-pay | Admitting: Internal Medicine

## 2019-02-04 ENCOUNTER — Ambulatory Visit: Payer: BC Managed Care – PPO | Admitting: Podiatry

## 2019-02-11 ENCOUNTER — Ambulatory Visit: Payer: BC Managed Care – PPO | Admitting: Podiatry

## 2019-02-11 ENCOUNTER — Encounter: Payer: Self-pay | Admitting: Podiatry

## 2019-02-11 DIAGNOSIS — S93601D Unspecified sprain of right foot, subsequent encounter: Secondary | ICD-10-CM

## 2019-02-13 NOTE — Progress Notes (Signed)
   HPI: 48 year old female presenting today for follow up evaluation of a sprained right foot. She states her pain has improved. She notes some mild discomfort if she is on the foot for long periods of time. She has been taking Tramadol for pain. Patient is here for further evaluation and treatment.   Past Medical History:  Diagnosis Date  . Anemia   . Anxiety   . Depression 2010  . Diabetes mellitus   . Endometriosis   . Headache, common migraine, intractable   . History of ovarian cyst    left sided, caused by her endometriosis (possibly an endometrioma), in her 81s.  Marland Kitchen Hyperlipidemia   . Hypertension   . Kidney stones    left  . Morbid obesity ([place])    s/p gastric bypass  . Personal hx of gastric bypass 2010  . Rosacea   . Vertigo      Physical Exam: General: The patient is alert and oriented x3 in no acute distress.  Dermatology: Skin is warm, dry and supple bilateral lower extremities. Negative for open lesions or macerations.  Vascular: Palpable pedal pulses bilaterally. Capillary refill within normal limits.  Neurological: Epicritic and protective threshold grossly intact bilaterally.   Musculoskeletal Exam: Range of motion within normal limits to all pedal and ankle joints bilateral. Muscle strength 5/5 in all groups bilateral.   Assessment: 1. Right foot contusion/sprain - healed    Plan of Care:  1. Patient evaluated.  2. May resume full activity with no restrictions.  3. Recommended good shoe gear.  4. Return to clinic as needed.      Edrick Kins, DPM [facility]  Dr. Edrick Kins, DPM    [address], [place] [address]                Office [phone]  Fax [phone]

## 2019-02-15 ENCOUNTER — Other Ambulatory Visit: Payer: Self-pay | Admitting: Internal Medicine

## 2019-02-18 ENCOUNTER — Other Ambulatory Visit: Payer: Self-pay | Admitting: Internal Medicine

## 2019-02-28 LAB — HM DIABETES EYE EXAM

## 2019-03-10 ENCOUNTER — Encounter: Payer: Self-pay | Admitting: Podiatry

## 2019-03-10 ENCOUNTER — Ambulatory Visit (INDEPENDENT_AMBULATORY_CARE_PROVIDER_SITE_OTHER): Payer: BC Managed Care – PPO | Admitting: Podiatry

## 2019-03-10 ENCOUNTER — Other Ambulatory Visit: Payer: Self-pay | Admitting: Internal Medicine

## 2019-03-10 ENCOUNTER — Other Ambulatory Visit: Payer: Self-pay

## 2019-03-10 VITALS — Temp 98.3°F

## 2019-03-10 DIAGNOSIS — M205X1 Other deformities of toe(s) (acquired), right foot: Secondary | ICD-10-CM

## 2019-03-10 NOTE — Progress Notes (Signed)
This patient presents to the office stating she is experiencing pain on the tip of her second toe right foot.  She says she is diabetic and is concerned that she has developed an infection in her second toe.  She says the tip of her second toe is painful due to the length of the second toe.  She says there is no drainage extending from her toe.  She also relates pain in the toe behind her nail second toe right.  She presents to the office for an evaluation and treatment of this second toe right foot.  Vascular  Dorsalis pedis and posterior tibial pulses are palpable  B/L.  Capillary return  WNL.  Temperature gradient is  WNL.  Skin turgor  WNL  Sensorium  Senn Weinstein monofilament wire  WNL. Normal tactile sensation.  Nail Exam  Patient has normal nails with no evidence of bacterial or fungal infection.  Orthopedic  Exam  Muscle tone and muscle strength  WNL.  No limitations of motion feet  B/L.  No crepitus or joint effusion noted.  Foot type is unremarkable .  Bony prominences are unremarkable. Mallet toe second toe right foot.    Skin  No open lesions.  Normal skin texture and turgor. Minimal distal clavi second toe right foot.  Mallet toe second right.  ROV.  Discussed condition with patient.  Padding dispensed second toe right.  RTC prn.    Gardiner Barefoot DPM

## 2019-03-11 ENCOUNTER — Other Ambulatory Visit: Payer: Self-pay | Admitting: Podiatry

## 2019-03-12 ENCOUNTER — Other Ambulatory Visit: Payer: Self-pay | Admitting: Internal Medicine

## 2019-03-24 ENCOUNTER — Other Ambulatory Visit: Payer: Self-pay | Admitting: Internal Medicine

## 2019-04-15 ENCOUNTER — Other Ambulatory Visit: Payer: Self-pay | Admitting: Internal Medicine

## 2019-04-15 DIAGNOSIS — Z9884 Bariatric surgery status: Secondary | ICD-10-CM | POA: Insufficient documentation

## 2019-04-15 NOTE — Telephone Encounter (Signed)
Looks like the flonase has been discontinued. Is it okay to refill?

## 2019-04-21 ENCOUNTER — Other Ambulatory Visit: Payer: Self-pay

## 2019-04-21 ENCOUNTER — Encounter: Payer: Self-pay | Admitting: Family Medicine

## 2019-04-21 ENCOUNTER — Ambulatory Visit (INDEPENDENT_AMBULATORY_CARE_PROVIDER_SITE_OTHER): Payer: BC Managed Care – PPO | Admitting: Family Medicine

## 2019-04-21 VITALS — BP 110/70 | HR 102 | Temp 98.3°F | Resp 18 | Ht 64.0 in | Wt 230.4 lb

## 2019-04-21 DIAGNOSIS — L0231 Cutaneous abscess of buttock: Secondary | ICD-10-CM

## 2019-04-21 MED ORDER — CLINDAMYCIN HCL 300 MG PO CAPS: 300.0000 mg | ORAL_CAPSULE | Freq: Three times a day (TID) | ORAL | 0 refills | Status: DC

## 2019-04-21 NOTE — Patient Instructions (Signed)
Incision and Drainage, Care After Refer to this sheet in the next few weeks. These instructions provide you with information about caring for yourself after your procedure. Your health care provider may also give you more specific instructions. Your treatment has been planned according to current medical practices, but problems sometimes occur. Call your health care provider if you have any problems or questions after your procedure. What can I expect after the procedure? After the procedure, it is common to have:  Pain or discomfort around your incision site.  Drainage from your incision. Follow these instructions at home:  Take over-the-counter and prescription medicines only as told by your health care provider.  If you were prescribed an antibiotic medicine, take it as told by your health care provider.Do not stop taking the antibiotic even if you start to feel better.  Followinstructions from your health care provider about: ? How to take care of your incision. ? When and how you should change your packing and bandage (dressing). Wash your hands with soap and water before you change your dressing. If soap and water are not available, use hand sanitizer. ? When you should remove your dressing.  Do not take baths, swim, or use a hot tub until your health care provider approves.  Keep all follow-up visits as told by your health care provider. This is important.  Check your incision area every day for signs of infection. Check for: ? More redness, swelling, or pain. ? More fluid or blood. ? Warmth. ? Pus or a bad smell. Contact a health care provider if:  Your cyst or abscess returns.  You have a fever.  You have more redness, swelling, or pain around your incision.  You have more fluid or blood coming from your incision.  Your incision feels warm to the touch.  You have pus or a bad smell coming from your incision. Get help right away if:  You have severe pain or  bleeding.  You cannot eat or drink without vomiting.  You have decreased urine output.  You become short of breath.  You have chest pain.  You cough up blood.  The area where the incision and drainage occurred becomes numb or it tingles. This information is not intended to replace advice given to you by your health care provider. Make sure you discuss any questions you have with your health care provider. Document Released: 02/19/2012 Document Revised: 04/28/2016 Document Reviewed: 09/17/2015 Elsevier Interactive Patient Education  2019 [employer] Skin Abscess  A skin abscess is an infected area of your skin that contains pus and other material. An abscess can happen in any part of your body. Some abscesses break open (rupture) on their own. Most continue to get worse unless they are treated. The infection can spread deeper into the body and into your blood, which can make you feel sick. A skin abscess is caused by germs that enter the skin through a cut or scrape. It can also be caused by blocked oil and sweat glands or infected hair follicles. This condition is usually treated by:  Draining the pus.  Taking antibiotic medicines.  Placing a warm, wet washcloth over the abscess. Follow these instructions at home: Medicines   Take over-the-counter and prescription medicines only as told by your doctor.  If you were prescribed an antibiotic medicine, take it as told by your doctor. Do not stop taking the antibiotic even if you start to feel better. Abscess care   If you have an abscess  that has not drained, place a warm, clean, wet washcloth over the abscess several times a day. Do this as told by your doctor.  Follow instructions from your doctor about how to take care of your abscess. Make sure you: ? Cover the abscess with a bandage (dressing). ? Change your bandage or gauze as told by your doctor. ? Wash your hands with soap and water before you change the bandage or  gauze. If you cannot use soap and water, use hand sanitizer.  Check your abscess every day for signs that the infection is getting worse. Check for: ? More redness, swelling, or pain. ? More fluid or blood. ? Warmth. ? More pus or a bad smell. General instructions  To avoid spreading the infection: ? Do not share personal care items, towels, or hot tubs with others. ? Avoid making skin-to-skin contact with other people.  Keep all follow-up visits as told by your doctor. This is important. Contact a doctor if:  You have more redness, swelling, or pain around your abscess.  You have more fluid or blood coming from your abscess.  Your abscess feels warm when you touch it.  You have more pus or a bad smell coming from your abscess.  You have a fever.  Your muscles ache.  You have chills.  You feel sick. Get help right away if:  You have very bad (severe) pain.  You see red streaks on your skin spreading away from the abscess. Summary  A skin abscess is an infected area of your skin that contains pus and other material.  The abscess is caused by germs that enter the skin through a cut or scrape. It can also be caused by blocked oil and sweat glands or infected hair follicles.  Follow your doctor's instructions on caring for your abscess, taking medicines, preventing infections, and keeping follow-up visits. This information is not intended to replace advice given to you by your health care provider. Make sure you discuss any questions you have with your health care provider. Document Released: 05/15/2008 Document Revised: 01/10/2018 Document Reviewed: 01/10/2018 Elsevier Interactive Patient Education  2019 [employer].

## 2019-04-21 NOTE — Progress Notes (Signed)
Subjective:    Patient ID: Maria Bentley, female    DOB: 1971-10-05, 48 y.o.   MRN: [id number]  HPI   Patient presents to clinic due to a boil on right buttock that has become painful.  Patient first noticed the area about 3 days ago, tried to help pain improved by sitting on a cushion while doing her work, soaking in the bathtub however it only is seeming to gotten bigger.  Patient has had boils previously that required incision and drainage.  Patient has not yet noticed any drainage from the boil area.  Patient Active Problem List   Diagnosis Date Noted  . Chronic pain 06/05/2018  . Iron deficiency anemia 06/05/2018  . Diarrhea of presumed infectious origin 04/21/2018  . Sepsis ([place]) 03/11/2018  . Paronychia of great toe of left foot 11/24/2017  . Hypertension 11/24/2017  . Pelvic mass in female 04/05/2017  . Tendonitis 03/30/2016  . Left ankle sprain 03/30/2016  . Peroneal tendonitis 01/13/2016  . Hip pain, chronic 12/28/2015  . Pain in joint, ankle and foot 12/28/2015  . Constipation 12/27/2015  . 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 Noncompliance with diabetes treatment 06/08/2015  . Right hip pain 05/30/2014  . Vitamin D deficiency 05/30/2014  . Tachycardia 08/15/2013  . Diabetes mellitus type 2, uncontrolled ([place]) 08/15/2013  . 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  . Dyspnea 07/15/2013  . Morbid obesity ([place]) 06/09/2012  . Anxiety   . Headache, common migraine, intractable   . Hyperlipidemia   . OSA (obstructive sleep apnea) 02/05/2012   Social History   Tobacco Use  . Smoking status: Former Smoker    Packs/day: 1.00    Years: 4.00    Pack years: 4.00    Types: Cigarettes    Last attempt to quit: 02/05/1996    Years since quitting: 23.2  . Smokeless tobacco: Never Used  Substance Use Topics  . Alcohol use: No   Review of Systems  Constitutional: Negative for chills, fatigue and fever.  HENT: Negative for congestion, ear pain,  sinus pain and sore throat.   Eyes: Negative.   Respiratory: Negative for cough, shortness of breath and wheezing.   Cardiovascular: Negative for chest pain, palpitations and leg swelling.  Gastrointestinal: Negative for abdominal pain, diarrhea, nausea and vomiting.  Genitourinary: Negative for dysuria, frequency and urgency.  Musculoskeletal: Negative for arthralgias and myalgias.  Skin: Boil on right buttock Neurological: Negative for syncope, light-headedness and headaches.  Psychiatric/Behavioral: The patient is not nervous/anxious.        Objective:   Physical Exam Vitals signs and nursing note reviewed.  Constitutional:      General: She is not in acute distress.    Appearance: She is obese. She is not ill-appearing, toxic-appearing or diaphoretic.  HENT:     Head: Normocephalic and atraumatic.  Eyes:     General: No scleral icterus.    Extraocular Movements: Extraocular movements intact.     Pupils: Pupils are equal, round, and reactive to light.  Neck:     Musculoskeletal: Neck supple. No neck rigidity.  Cardiovascular:     Rate and Rhythm: Regular rhythm.     Heart sounds: Normal heart sounds.  Pulmonary:     Effort: Pulmonary effort is normal. No respiratory distress.     Breath sounds: Normal breath sounds.  Skin:    General: Skin is warm and dry.     Findings: Abscess (Location of abscess indicated by red circle on diagram. Approx size of  gumball with small white head. ) present.          Comments: Incision and drainage procedure:  Patient gave verbal agreement to proceed forward with I&D procedure.  Patient understands risks and benefits of this procedure and is still willing to proceed forward. Abscess area on right buttock cleansed with Betadine.  Area numbed with lidocaine 1%.  Small incision made with #11 sterile scalpel blade.  Large amount of blood and pus drained from area.  Skin then cleansed with alcohol swab to remove iodine staining.  Thin layer  bacitracin applied to small incision area and gauze pad placed.    Neurological:     Mental Status: She is alert and oriented to person, place, and time.     Gait: Gait normal.  Psychiatric:        Mood and Affect: Mood normal.        Behavior: Behavior normal.    Vitals:   04/21/19 1512  BP: 110/70  Pulse: (!) 102  Resp: 18  Temp: 98.3 F (36.8 C)  SpO2: 96%      Assessment & Plan:   Abscess on right buttock cheek - I&D performed with success to drain area.  Patient will take clindamycin 3 times daily for 10 days. She will cleanse skin daily with mild soap, water then pat dry; she will thin apply thin layer of bacitracin and can place a small gauze pad over area to avoid skin rubbing.  Advised to monitor area for any worsening drainage, increase in size pain or redness and to call us right away if these occur.  Advised patient that she will notice small amount of drainage on gauze pad when she does change it out daily however it should not be a excessive amount of drainage.  Advised patient that if this area recurs, most likely she will have to see general surgeon for evaluation and for removal of cyst sac.  Patient verbalized understanding.  Handout given to patient outlining skin care and wound care.  Patient will keep regularly scheduled follow-up with PCP as planned and return to clinic sooner if any issues arise.

## 2019-04-28 ENCOUNTER — Encounter: Payer: Self-pay | Admitting: Family Medicine

## 2019-04-28 ENCOUNTER — Other Ambulatory Visit: Payer: Self-pay

## 2019-04-28 ENCOUNTER — Ambulatory Visit: Payer: BC Managed Care – PPO | Admitting: Family Medicine

## 2019-04-28 VITALS — BP 162/90 | HR 102 | Temp 98.0°F | Resp 18 | Ht 64.0 in | Wt 235.2 lb

## 2019-04-28 DIAGNOSIS — L0231 Cutaneous abscess of buttock: Secondary | ICD-10-CM

## 2019-04-28 MED ORDER — DOXYCYCLINE HYCLATE 100 MG PO TABS: 100.0000 mg | ORAL_TABLET | Freq: Two times a day (BID) | ORAL | 0 refills | Status: DC

## 2019-04-28 NOTE — Progress Notes (Signed)
Subjective:    Patient ID: Maria Bentley, female    DOB: 05-05-1971, 48 y.o.   MRN: [id number]  HPI   Patient presents to clinic due to thinking boil on right buttock if there has become green phlegm and or there is a new boil.  Patient was seen on 04/21/2019 due to boil on right buttock cheek, small incision was made in boil to lancet and pus and blood was drained.  Patient was placed on clindamycin course and was feeling better.  States yesterday started to feel some soreness and also some increased tenderness in the area.  Area has continued to still drain a small amount of pus and blood, but does not seem to be draining in excess.  Denies fever or chills.  Denies cough, shortness of breath or wheezing.  Denies nausea or vomiting.  Denies body aches.  Patient Active Problem List   Diagnosis Date Noted  . Chronic pain 06/05/2018  . Iron deficiency anemia 06/05/2018  . Diarrhea of presumed infectious origin 04/21/2018  . Sepsis ([place]) 03/11/2018  . Paronychia of great toe of left foot 11/24/2017  . Hypertension 11/24/2017  . Pelvic mass in female 04/05/2017  . Tendonitis 03/30/2016  . Left ankle sprain 03/30/2016  . Peroneal tendonitis 01/13/2016  . Hip pain, chronic 12/28/2015  . Pain in joint, ankle and foot 12/28/2015  . Constipation 12/27/2015  . 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 Noncompliance with diabetes treatment 06/08/2015  . Right hip pain 05/30/2014  . Vitamin D deficiency 05/30/2014  . Tachycardia 08/15/2013  . Diabetes mellitus type 2, uncontrolled ([place]) 08/15/2013  . 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  . Dyspnea 07/15/2013  . Morbid obesity ([place]) 06/09/2012  . Anxiety   . Headache, common migraine, intractable   . Hyperlipidemia   . OSA (obstructive sleep apnea) 02/05/2012   Social History   Tobacco Use  . Smoking status: Former Smoker    Packs/day: 1.00    Years: 4.00    Pack years: 4.00    Types: Cigarettes    Last  attempt to quit: 02/05/1996    Years since quitting: 23.2  . Smokeless tobacco: Never Used  Substance Use Topics  . Alcohol use: No    Review of Systems  Constitutional: Negative for chills, fatigue and fever.  HENT: Negative for congestion, ear pain, sinus pain and sore throat.   Eyes: Negative.   Respiratory: Negative for cough, shortness of breath and wheezing.   Cardiovascular: Negative for chest pain, palpitations and leg swelling.  Gastrointestinal: Negative for abdominal pain, diarrhea, nausea and vomiting.  Genitourinary: Negative for dysuria, frequency and urgency.  Musculoskeletal: Negative for arthralgias and myalgias.  Skin: +abscess in right buttock cheek.  Neurological: Negative for syncope, light-headedness and headaches.  Psychiatric/Behavioral: The patient is not nervous/anxious.    Objective:   Physical Exam Vitals signs and nursing note reviewed.  Constitutional:      General: She is not in acute distress.    Appearance: She is not ill-appearing or toxic-appearing.  HENT:     Head: Normocephalic and atraumatic.  Eyes:     General: No scleral icterus.    Extraocular Movements: Extraocular movements intact.  Cardiovascular:     Rate and Rhythm: Normal rate and regular rhythm.  Pulmonary:     Effort: Pulmonary effort is normal. No respiratory distress.     Breath sounds: Normal breath sounds.  Skin:         Comments: Abscess area on right inner  buttock cheek. Does not feel as hard as it did on 04/21/2019. Layer of white slough-like tissue present, approx diameter of pencil eraser, will some surrounding skin redness. Area is not draining at this time.   Neurological:     Mental Status: She is alert and oriented to person, place, and time.  Psychiatric:        Mood and Affect: Mood normal.        Behavior: Behavior normal.     Today's Vitals   04/28/19 1328  BP: (!) 162/90  Pulse: (!) 102  Resp: 18  Temp: 98 F (36.7 C)  TempSrc: Oral  SpO2: 92%   Weight: 235 lb 3.2 oz (106.7 kg)  Height: 5\' 4"  (1.626 m)   Body mass index is 40.37 kg/m.     Assessment & Plan:   Abscess on right buttock cheek - due to potential slough and or abscess being deeper than we can handle at the primary care clinic, will refer to general surgery for evaluation to see if debridement is necessary and or any other further surgical intervention is needed.  Patient will finish clindamycin course as prescribed and we will also add on doxycycline course.  She is aware that someone will contact her from the surgery office for appointment time.  Advised to monitor area for any worsening, and if becomes severe prior to her being able to see surgery, to go to ER right away for evaluation.  Patient verbalized understanding of plan of care.  She is aware she can call office anytime with questions or concerns.

## 2019-04-30 ENCOUNTER — Encounter: Payer: Self-pay | Admitting: General Surgery

## 2019-04-30 ENCOUNTER — Other Ambulatory Visit: Payer: Self-pay

## 2019-04-30 ENCOUNTER — Ambulatory Visit: Payer: BC Managed Care – PPO | Admitting: General Surgery

## 2019-04-30 ENCOUNTER — Other Ambulatory Visit: Payer: Self-pay | Admitting: Internal Medicine

## 2019-04-30 VITALS — BP 180/102 | HR 118 | Temp 97.9°F | Ht 64.0 in | Wt 233.0 lb

## 2019-04-30 DIAGNOSIS — L0231 Cutaneous abscess of buttock: Secondary | ICD-10-CM | POA: Diagnosis not present

## 2019-04-30 HISTORY — DX: Cutaneous abscess of buttock: L02.31

## 2019-04-30 NOTE — Patient Instructions (Addendum)
[facility]  Liquids only 4 hours prior.   We will call you with the results.

## 2019-04-30 NOTE — Progress Notes (Addendum)
Patient ID: Maria Bentley, female   DOB: 1971/06/20, 48 y.o.   MRN: [id number]  Chief Complaint  Patient presents with  . other    right buttock abscess    HPI Maria Bentley is a 47 y.o. female.   He was referred by her primary care doctor's office for evaluation of a right buttock abscess.  She states that it first appeared about 2-1/2 weeks ago.  She sought the attention of her primary care provider.  The nurse practitioner in the office "nicked" it and it drained pus about 1-1/2 weeks ago.  It has still been draining serous fluid mixed with a little bit of blood since that time.  She also noticed a second firm area more medial to the wound and closer to the anus.  This is also somewhat tender.  She saw the nurse practitioner again yesterday.  Doxycycline was added to the clindamycin she was already taking, due to the persistence of this site.  The NP thought that the area did not look good and sent her to general surgery for further evaluation.  She has not had any fevers or chills.  She denies nausea or vomiting.  She is diabetic.  I do not see a recent A1c on her, however the most recent test obtained in September 2019, was elevated at 8.7.  He says that she has previously had an abscess in this same location that was treated with simple incision and drainage.   Past Medical History:  Diagnosis Date  . Anemia   . Anxiety   . Depression 2010  . Diabetes mellitus   . Endometriosis   . Headache, common migraine, intractable   . History of ovarian cyst    left sided, caused by her endometriosis (possibly an endometrioma), in her 84s.  Marland Kitchen Hyperlipidemia   . Hypertension   . Kidney stones    left  . Morbid obesity ([place])    s/p gastric bypass  . Personal hx of gastric bypass 2010  . Rosacea   . Vertigo     Past Surgical History:  Procedure Laterality Date  . DILATION AND CURETTAGE OF UTERUS  2011  . gastric bypass    . OVARIAN CYST REMOVAL    . ROTATOR CUFF REPAIR      right    Family History  Problem Relation Age of Onset  . Diabetes Mother   . Hypertension Mother   . Hypertension Father   . Diabetes Father   . Depression Other        strong hx of  . Breast cancer Paternal Grandmother 30  . Cancer Neg Hx     Social History Social History   Tobacco Use  . Smoking status: Former Smoker    Packs/day: 1.00    Years: 4.00    Pack years: 4.00    Types: Cigarettes    Last attempt to quit: 02/05/1996    Years since quitting: 23.2  . Smokeless tobacco: Never Used  Substance Use Topics  . Alcohol use: No  . Drug use: No    Allergies  Allergen Reactions  . Amoxicillin Hives and Rash  . Avocado Swelling  . Clarithromycin Hives and Rash  . Honey Anaphylaxis  . Other Hives and Swelling    All melon  . Sulfa Drugs Cross Reactors Anaphylaxis  . Watermelon Concentrate Hives and Swelling    Oral swelling, ears swell shut  . Metoprolol Other (See Comments)    Hypotension  . Adhesive [Tape]  Itching    Skin breakdown  . Levemir [Insulin Detemir] Rash    Red/swollen injection site reactions with residual lumps  . Victoza [Liraglutide] Nausea Only    Current Outpatient Medications  Medication Sig Dispense Refill  . acetaminophen (TYLENOL) 325 MG tablet Take by mouth.    Marland Kitchen albuterol (VENTOLIN HFA) 108 (90 Base) MCG/ACT inhaler Inhale 2 puffs into the lungs every 4 (four) hours as needed for wheezing or shortness of breath. 18 g 3  . atorvastatin (LIPITOR) 20 MG tablet TAKE 1 TABLET BY MOUTH EVERY DAY 90 tablet 1  . azelastine (ASTELIN) 0.1 % nasal spray Place 1 spray into both nostrils 2 (two) times daily. Use in each nostril as directed    . B-D INS SYR MICROFINE 1CC/28G 28G X 1/2" 1 ML MISC USE 1 SYRINGE DAILY AFTER SUPPER 100 each 1  . blood glucose meter kit and supplies KIT Dispense based on patient and insurance preference. Use up to four times daily as directed. (FOR ICD-E11.21) 1 each 0  . buPROPion (WELLBUTRIN XL) 150 MG 24 hr tablet  TAKE 3 TABLETS BY MOUTH IN THE MORNING  3  . clindamycin (CLEOCIN) 300 MG capsule Take 1 capsule (300 mg total) by mouth 3 (three) times daily. 30 capsule 0  . Continuous Blood Gluc Receiver (FREESTYLE LIBRE 14 DAY READER) DEVI 1 applicator by Does not apply route 4 (four) times daily. 1 Device 1  . Continuous Blood Gluc Sensor (FREESTYLE LIBRE 14 DAY SENSOR) MISC USE TO TEST BLOOD SUGAR 4 TIMES DAILY. CHANGING EVERY 14 DAYS 1 each 2  . cyanocobalamin (,VITAMIN B-12,) 1000 MCG/ML injection INJECT 1 ML (1,000 MCG TOTAL) INTO THE MUSCLE ONCE A WEEK. FOR 4 WEEKS, THEN MONTHLY THEREAFTER 6 mL 4  . cyclobenzaprine (FLEXERIL) 10 MG tablet TAKE 1/2 TO 1 UP TO THREE TIMES DAILY AS NEEDED. DROWSINESS PRECAUTIONS  12  . diltiazem (CARDIZEM CD) 120 MG 24 hr capsule TAKE 1 CAPSULE BY MOUTH EVERY DAY 90 capsule 1  . docusate sodium (COLACE) 100 MG capsule Take 1 capsule (100 mg total) by mouth 2 (two) times daily as needed for mild constipation. 10 capsule 0  . doxycycline (VIBRA-TABS) 100 MG tablet Take 1 tablet (100 mg total) by mouth 2 (two) times daily. 20 tablet 0  . EPINEPHrine 0.3 mg/0.3 mL IJ SOAJ injection     . escitalopram (LEXAPRO) 20 MG tablet Take 20 mg by mouth daily.     . fluticasone (FLONASE) 50 MCG/ACT nasal spray SPRAY 2 SPRAYS INTO EACH NOSTRIL EVERY DAY 16 g 0  . INS SYRINGE/NEEDLE 1CC/28G (B-D INS SYR MICROFINE 1CC/28G) 28G X 1/2" 1 ML MISC USE 1 SYRINGE DAILY AFTER SUPPER 100 each 3  . insulin aspart (NOVOLOG FLEXPEN) 100 UNIT/ML FlexPen Inject 15 units + sliding scale under the skin with meals. Add 3-5 units for every blood sugar 50 mg/dL over 150. Max dose of 30 units with meals. 30 mL 2  . Insulin Glargine (BASAGLAR KWIKPEN) 100 UNIT/ML SOPN Inject 0.5 mLs (50 Units total) into the skin daily. 15 mL 2  . Insulin Pen Needle (BD PEN NEEDLE NANO U/F) 32G X 4 MM MISC Please specify directions, refills and quantity 100 each 11  . Insulin Syringe-Needle U-100 (B-D INS SYR ULTRAFINE 1CC/31G)  31G X 5/16" 1 ML MISC 1 Syringe by Does not apply route daily. 100 each 0  . Lactulose 20 GM/30ML SOLN 30 ml every 4 hours until constipation is relieved 236 mL 3  .  lamoTRIgine (LAMICTAL) 100 MG tablet Take 150 mg by mouth daily.     [name] Device MISC Test blood sugar two times daily 100 each 6  . LORazepam (ATIVAN) 0.5 MG tablet Take 0.5-1 mg by mouth daily as needed.     . meclizine (ANTIVERT) 25 MG tablet Take 1 tablet (25 mg total) by mouth 3 (three) times daily as needed for dizziness. 90 tablet 6  . meloxicam (MOBIC) 15 MG tablet TAKE 1 TABLET BY MOUTH EVERY DAY 60 tablet 1  . metFORMIN (GLUCOPHAGE-XR) 500 MG 24 hr tablet TAKE 4 TABLETS BY MOUTH EVERY DAY WITH BREAKFAST 360 tablet 2  . naproxen (NAPROSYN) 500 MG tablet Take 500 mg by mouth 2 (two) times daily as needed.  0  . NOVOLOG FLEXPEN 100 UNIT/ML FlexPen INJECT 20 UNITS INTO THE SKIN 3 TIMES DAILY WITH MEALS 15 mL 2  . nystatin (MYCOSTATIN/NYSTOP) powder APPLY 1 APPLICATION TO AFFECTED AREA TWICE A DAY 60 g 0  . OnabotulinumtoxinA (BOTOX IJ) Inject as directed every 3 (three) months. For migraine    . ONETOUCH VERIO test strip TEST BLOOD SUGAR 5 TIMES DAILY 500 each 1  . oxyCODONE-acetaminophen (PERCOCET) 10-325 MG tablet Take 1 tablet by mouth every 6 (six) hours as needed for pain.    . pregabalin (LYRICA) 50 MG capsule Taking no more than 650 mg in the day. Spacing out.    . promethazine (PHENERGAN) 25 MG tablet TAKE 1 TABLET (25 MG TOTAL) BY MOUTH EVERY 8 (EIGHT) HOURS AS NEEDED. 15 tablet 5  . SUMAtriptan (IMITREX) 100 MG tablet TAKE 1 TABLET BY MOUTH EVERY DAY AS NEEDED MAY REPEAT IN 2HRS IF HEADACHE PERSISTS. MAX 2 TABS/24 HR 9 tablet 0  . SYRINGE-NEEDLE, DISP, 3 ML (BD INTEGRA SYRINGE) 25G X 1" 3 ML MISC FOR USE WITH B12 INJECTIONS WEEKLY/MONTHLY 25 each 0  . traMADol (ULTRAM) 50 MG tablet Take 1 tablet (50 mg total) by mouth every 8 (eight) hours as needed for severe pain. 90 tablet 0  . Vitamin D, Ergocalciferol,  (DRISDOL) 1.25 MG (50000 UT) CAPS capsule TAKE ONE CAPSULE BY MOUTH ONE TIME PER WEEK 12 capsule 1   No current facility-administered medications for this visit.     Review of Systems Review of Systems  All other systems reviewed and are negative.   Blood pressure (!) 180/102, pulse (!) 118, temperature 97.9 F (36.6 C), temperature source Skin, height 5' 4"  (1.626 m), weight 233 lb (105.7 kg), last menstrual period 05/09/2016, SpO2 95 %.  Physical Exam Physical Exam Vitals signs reviewed. Exam conducted with a chaperone present.  Constitutional:      General: She is not in acute distress.    Appearance: Normal appearance. She is obese.     Comments: Significant centripetal obesity with thin extremities.  HENT:     Head: Normocephalic and atraumatic.     Nose:     Comments: Covered with a mask secondary to COVID-19    Mouth/Throat:     Comments: Covered with a mask secondary to COVID-19 Eyes:     General: No scleral icterus.       Right eye: No discharge.        Left eye: No discharge.     Comments: Wearing glasses  Neck:     Musculoskeletal: Normal range of motion and neck supple.  Cardiovascular:     Rate and Rhythm: Tachycardia present.  Pulmonary:     Effort: Pulmonary effort is normal.  Abdominal:  Palpations: Abdomen is soft.  Genitourinary:      Comments: There is an area of erythematous indurated skin.  At the center there is fibrinous slough and some dead overlying tissue.  No frank pus is present.  With probing, there is some tracking under the skin laterally.  There is an indurated area more medial towards the anus.  No fluctuance is appreciated.  Digital rectal exam was performed and no abscess palpated. Musculoskeletal:        General: No tenderness.     Right lower leg: No edema.     Left lower leg: No edema.  Skin:    General: Skin is warm and dry.  Neurological:     General: No focal deficit present.     Mental Status: She is alert.  Psychiatric:         Mood and Affect: Mood normal.        Behavior: Behavior normal.       Data Reviewed No relevant data available for review  Assessment and Plan: This is a 48 year old diabetic patient who presented with an abscess on her right buttock to her primary care provider's office a week and a half ago.  It was incised with drainage of pus and she was started on antibiotics.  There was still some drainage present and a second lump developed medial to the first.  Additional antibiotics were started, however due to the appearance of the site, she was sent to general surgery for further evaluation.  I debrided the necrotic appearing tissue and fibrinous exudate.  I do not suspect necrotizing fasciitis based upon the appearance of the wound.  The more medial lesion may be the beginning of an abscess, but I am unable to determine this in the office.  I am not certain whether or not there is tracking between the 2 sites.  She should continue her antibiotic therapy and wash the open area with warm soap and water.  She does not have a tub so cannot soak in Epsom salts but will make an effort to keep the area very clean.  We will order a CT scan of the pelvis with IV contrast to determine the extent of the lesion and whether or not there is communication with the more medial lump, as well as whether or not there is an abscess present in this location.  Once these results are available, we will discuss them with the patient and determine what, if any, course of action is necessary beyond this.      Fredirick Maudlin 04/30/2019, 2:24 PM

## 2019-05-01 ENCOUNTER — Ambulatory Visit: Payer: BC Managed Care – PPO

## 2019-05-02 ENCOUNTER — Other Ambulatory Visit: Payer: Self-pay

## 2019-05-02 ENCOUNTER — Ambulatory Visit
Admission: RE | Admit: 2019-05-02 | Discharge: 2019-05-02 | Disposition: A | Discharge status: Home / Self Care | Payer: BC Managed Care – PPO | Source: Ambulatory Visit | Attending: General Surgery | Admitting: General Surgery

## 2019-05-02 DIAGNOSIS — L0231 Cutaneous abscess of buttock: Secondary | ICD-10-CM | POA: Insufficient documentation

## 2019-05-02 LAB — POCT I-STAT CREATININE: Creatinine, Ser: 0.9 mg/dL (ref 0.44–1.00)

## 2019-05-02 MED ORDER — IOHEXOL 300 MG/ML  SOLN
100.0000 mL | Freq: Once | INTRAMUSCULAR | Status: AC | PRN
  Administered 2019-05-02: 100 mL via INTRAVENOUS

## 2019-05-07 ENCOUNTER — Other Ambulatory Visit: Payer: Self-pay | Admitting: Internal Medicine

## 2019-05-07 ENCOUNTER — Telehealth: Payer: Self-pay | Admitting: General Surgery

## 2019-05-07 NOTE — Telephone Encounter (Signed)
I discussed the results of her recent CT scan.  In brief, there is no undrained abscess or concern for necrotizing soft tissue fraction.  The findings were consistent with simple cellulitis.  Maria Bentley states that she is doing much better, and that the area of induration has improved since being on the doxycycline.  She still has a small amount of serous drainage but no pus.  I encouraged her to continue to clean the area with warm soapy water at least once a day and to complete her course of antibiotics.  No surgical intervention is necessary.

## 2019-05-13 ENCOUNTER — Other Ambulatory Visit: Payer: Self-pay | Admitting: Internal Medicine

## 2019-05-16 ENCOUNTER — Other Ambulatory Visit: Payer: Self-pay | Admitting: Internal Medicine

## 2019-05-20 ENCOUNTER — Other Ambulatory Visit: Payer: Self-pay | Admitting: Internal Medicine

## 2019-05-28 ENCOUNTER — Other Ambulatory Visit: Payer: Self-pay | Admitting: Internal Medicine

## 2019-06-11 ENCOUNTER — Other Ambulatory Visit: Payer: Self-pay | Admitting: Internal Medicine

## 2019-06-15 ENCOUNTER — Other Ambulatory Visit: Payer: Self-pay | Admitting: Internal Medicine

## 2019-06-16 ENCOUNTER — Telehealth: Payer: Self-pay

## 2019-06-16 NOTE — Telephone Encounter (Signed)
LMTCB. Need to schedule pt a follow up appt with Dr. [name]. Pt has not been seen since 05/2018.

## 2019-06-17 ENCOUNTER — Other Ambulatory Visit: Payer: Self-pay | Admitting: Internal Medicine

## 2019-07-01 ENCOUNTER — Other Ambulatory Visit: Payer: Self-pay | Admitting: Podiatry

## 2019-07-01 MED ORDER — MELOXICAM 15 MG PO TABS: 15.0000 mg | ORAL_TABLET | Freq: Every day | ORAL | 1 refills | Status: DC

## 2019-07-01 NOTE — Telephone Encounter (Signed)
Dr. Prudence Davidson please advice on refill

## 2019-07-03 ENCOUNTER — Other Ambulatory Visit: Payer: Self-pay | Admitting: Internal Medicine

## 2019-07-14 ENCOUNTER — Telehealth: Payer: Self-pay | Admitting: Cardiovascular Disease

## 2019-07-14 MED ORDER — DILTIAZEM HCL ER COATED BEADS 120 MG PO CP24: ORAL_CAPSULE | ORAL | 3 refills | Status: DC

## 2019-07-14 NOTE — Telephone Encounter (Signed)
Pt c/o BP issue: STAT if pt c/o blurred vision, one-sided weakness or slurred speech  1. What are your last 5 BP readings? htn at clinic  2. Are you having any other symptoms (ex. Dizziness, headache, blurred vision, passed out)? Not taking bp meds   3. What is your BP issue? htn not taking bp meds x 2 weeks Dr. Rico Sheehan on hold for provider or triage

## 2019-07-14 NOTE — Telephone Encounter (Signed)
Received call from Dr Sharlet Salina at [facility] concerning patient. Patient there for an injection and her BP 186/104 (auto) and 160/101 (manual). Patient states she has not taken her BP medication, Diltiazem, in 2-3 weeks because she is out. Per Dr Sharlet Salina, patient is asymptomatic otherwise.  Discussed with Dr Fletcher Anon. He advised it was ok to refill diltiazem and plan to have patient come in for appointment in a few weeks to assess her BP management. Dr Sharlet Salina will let the patient know about [place] sending in the refill and for patient to call for an appointment.  Rx sent to pharmacy.  Routing to scheduling to call patient to schedule appointment in 2-4 weeks.

## 2019-07-15 NOTE — Telephone Encounter (Signed)
Scheduling LVM for patient to call back and schedule within 2-4 weeks.

## 2019-07-30 ENCOUNTER — Other Ambulatory Visit: Payer: Self-pay | Admitting: Internal Medicine

## 2019-08-06 ENCOUNTER — Other Ambulatory Visit: Payer: Self-pay

## 2019-08-06 DIAGNOSIS — Z20822 Contact with and (suspected) exposure to covid-19: Secondary | ICD-10-CM

## 2019-08-07 LAB — NOVEL CORONAVIRUS, NAA: SARS-CoV-2, NAA: NOT DETECTED

## 2019-08-11 ENCOUNTER — Other Ambulatory Visit: Payer: Self-pay | Admitting: Internal Medicine

## 2019-08-13 DIAGNOSIS — Z0279 Encounter for issue of other medical certificate: Secondary | ICD-10-CM

## 2019-08-14 ENCOUNTER — Other Ambulatory Visit: Payer: Self-pay | Admitting: Internal Medicine

## 2019-08-16 ENCOUNTER — Other Ambulatory Visit: Payer: Self-pay | Admitting: Internal Medicine

## 2019-08-19 ENCOUNTER — Ambulatory Visit: Payer: BC Managed Care – PPO | Admitting: Internal Medicine

## 2019-08-20 ENCOUNTER — Other Ambulatory Visit: Payer: Self-pay

## 2019-08-22 ENCOUNTER — Ambulatory Visit: Payer: BC Managed Care – PPO | Admitting: Internal Medicine

## 2019-08-26 ENCOUNTER — Encounter: Payer: BC Managed Care – PPO | Admitting: Obstetrics and Gynecology

## 2019-08-28 ENCOUNTER — Other Ambulatory Visit: Payer: Self-pay | Admitting: Internal Medicine

## 2019-08-29 ENCOUNTER — Encounter: Payer: Self-pay | Admitting: Cardiovascular Disease

## 2019-08-29 ENCOUNTER — Ambulatory Visit (INDEPENDENT_AMBULATORY_CARE_PROVIDER_SITE_OTHER): Payer: BC Managed Care – PPO | Admitting: Cardiovascular Disease

## 2019-08-29 ENCOUNTER — Other Ambulatory Visit: Payer: Self-pay

## 2019-08-29 VITALS — BP 120/90 | HR 117 | Ht 64.0 in | Wt 222.8 lb

## 2019-08-29 DIAGNOSIS — R000 Tachycardia, unspecified: Secondary | ICD-10-CM | POA: Diagnosis not present

## 2019-08-29 DIAGNOSIS — I10 Essential (primary) hypertension: Secondary | ICD-10-CM

## 2019-08-29 DIAGNOSIS — R0602 Shortness of breath: Secondary | ICD-10-CM | POA: Diagnosis not present

## 2019-08-29 MED ORDER — DILTIAZEM HCL ER COATED BEADS 180 MG PO CP24: ORAL_CAPSULE | ORAL | 2 refills | Status: DC

## 2019-08-29 NOTE — Patient Instructions (Signed)
Medication Instructions:  Your physician has recommended you make the following change in your medication:   INCREASE Diltiazem to 180mg  daily. An Rx has been sent to your pharmacy.  If you need a refill on your cardiac medications before your next appointment, please call your pharmacy.   Lab work: None ordered If you have labs (blood work) drawn today and your tests are completely normal, you will receive your results only by: Marland Kitchen MyChart Message (if you have MyChart) OR . A paper copy in the mail If you have any lab test that is abnormal or we need to change your treatment, we will call you to review the results.  Testing/Procedures: Your physician has requested that you have an echocardiogram. Echocardiography is a painless test that uses sound waves to create images of your heart. It provides your doctor with information about the size and shape of your heart and how well your heart's chambers and valves are working. This procedure takes approximately one hour. There are no restrictions for this procedure.    Follow-Up: At [facility], you and your health needs are our priority.  As part of our continuing mission to provide you with exceptional heart care, we have created designated Provider Care Teams.  These Care Teams include your primary Cardiologist (physician) and Advanced Practice Providers (APPs -  Physician Assistants and Nurse Practitioners) who all work together to provide you with the care you need, when you need it. You will need a follow up appointment in 6 months.  Please call our office 2 months in advance to schedule this appointment.  You may see  Dr. Fletcher Anon  or one of the following Advanced Practice Providers on your designated Care Team:   Murray Hodgkins, NP Christell Faith, PA-C . Marrianne Mood, PA-C  Any Other Special Instructions Will Be Listed Below (If Applicable). N/A

## 2019-08-29 NOTE — Progress Notes (Signed)
Cardiology Office Note   Date:  08/29/2019   ID:  Maria Bentley, Maria Bentley 1971/03/29, MRN [id number]  PCP:  [name], MD  Cardiologist:   Kathlyn Sacramento, MD   Chief Complaint  Patient presents with  . other    Discuss BP medications c/o headaches and loss of balance. Meds reviewed verbally with pt.      History of Present Illness: Maria Bentley is a 48 y.o. female who is here today for follow-up visit regarding inappropriate sinus tachycardia and elevated blood pressure.  She was seen by me in 2014 for dyspnea and palpitations.  She was noted to have resting tachycardia in spite of taking Toprol 100 mg daily.  She had a 48-hour Holter monitor which showed normal sinus rhythm with no significant arrhythmia.  There was rare PACs and PVCs.  Echocardiogram at that time was completely normal. She has chronic medical conditions that include diabetes mellitus type 2, morbid obesity, status post gastric bypass, chronic daily headaches and low back pain.   She reports having resting tachycardia all her life.  She came off metoprolol to take allergy shots and her symptoms worsened after that.  She was placed on lisinopril but that caused worsening tachycardia and orthostatic hypotension.  Symptoms did not improve after resuming metoprolol. Due to that, she was started on diltiazem with significant improvement in her symptoms.   However, she stopped taking the medication on her own.  She has been having low back pain and recently was seen by Dr. Sharlet Salina.  During that visit, she was hypertensive and tachycardic.  He called our office and we advised to resume diltiazem which was done.  Since then, blood pressure improved.  She continues to be mildly tachycardic.  She continues to complain of exertional dyspnea without chest pain.  She was supposed to get an echocardiogram done last year but did not.  She reports multiple deaths in her family which has been stressful.  Past Medical  History:  Diagnosis Date  . Anemia   . Anxiety   . Depression 2010  . Diabetes mellitus   . Endometriosis   . Headache, common migraine, intractable   . History of ovarian cyst    left sided, caused by her endometriosis (possibly an endometrioma), in her 15s.  Marland Kitchen Hyperlipidemia   . Hypertension   . Kidney stones    left  . Morbid obesity ([place])    s/p gastric bypass  . Personal hx of gastric bypass 2010  . Rosacea   . Vertigo     Past Surgical History:  Procedure Laterality Date  . DILATION AND CURETTAGE OF UTERUS  2011  . gastric bypass    . OVARIAN CYST REMOVAL    . ROTATOR CUFF REPAIR     right     Current Outpatient Medications  Medication Sig Dispense Refill  . acetaminophen (TYLENOL) 325 MG tablet Take by mouth.    Marland Kitchen albuterol (VENTOLIN HFA) 108 (90 Base) MCG/ACT inhaler Inhale 2 puffs into the lungs every 4 (four) hours as needed for wheezing or shortness of breath. 18 g 3  . atorvastatin (LIPITOR) 20 MG tablet TAKE 1 TABLET BY MOUTH EVERY DAY 90 tablet 0  . azelastine (ASTELIN) 0.1 % nasal spray Place 1 spray into both nostrils 2 (two) times daily. Use in each nostril as directed    . B-D INS SYR MICROFINE 1CC/28G 28G X 1/2" 1 ML MISC USE 1 SYRINGE DAILY AFTER SUPPER 100 each 1  .  blood glucose meter kit and supplies KIT Dispense based on patient and insurance preference. Use up to four times daily as directed. (FOR ICD-E11.21) 1 each 0  . buPROPion (WELLBUTRIN XL) 150 MG 24 hr tablet TAKE 3 TABLETS BY MOUTH IN THE MORNING  3  . Continuous Blood Gluc Receiver (FREESTYLE LIBRE 14 DAY READER) DEVI 1 applicator by Does not apply route 4 (four) times daily. 1 Device 1  . Continuous Blood Gluc Sensor (FREESTYLE LIBRE 14 DAY SENSOR) MISC USE TO TEST BLOOD SUGAR 4 TIMES DAILY. CHANGING EVERY 14 DAYS 2 each 1  . cyanocobalamin (,VITAMIN B-12,) 1000 MCG/ML injection INJECT 1 ML (1,000 MCG TOTAL) INTO THE MUSCLE ONCE A WEEK. FOR 4 WEEKS, THEN MONTHLY THEREAFTER 6 mL 4  .  cyclobenzaprine (FLEXERIL) 10 MG tablet TAKE 1/2 TO 1 UP TO THREE TIMES DAILY AS NEEDED. DROWSINESS PRECAUTIONS  12  . diltiazem (CARDIZEM CD) 120 MG 24 hr capsule TAKE 1 CAPSULE BY MOUTH EVERY DAY 30 capsule 3  . docusate sodium (COLACE) 100 MG capsule Take 1 capsule (100 mg total) by mouth 2 (two) times daily as needed for mild constipation. 10 capsule 0  . EPINEPHrine 0.3 mg/0.3 mL IJ SOAJ injection     . escitalopram (LEXAPRO) 20 MG tablet Take 20 mg by mouth daily.     . fluticasone (FLONASE) 50 MCG/ACT nasal spray SPRAY 2 SPRAYS INTO EACH NOSTRIL EVERY DAY 16 mL 1  . INS SYRINGE/NEEDLE 1CC/28G (B-D INS SYR MICROFINE 1CC/28G) 28G X 1/2" 1 ML MISC USE 1 SYRINGE DAILY AFTER SUPPER 100 each 3  . insulin aspart (NOVOLOG FLEXPEN) 100 UNIT/ML FlexPen Inject 15 units + sliding scale under the skin with meals. Add 3-5 units for every blood sugar 50 mg/dL over 150. Max dose of 30 units with meals. 30 mL 2  . Insulin Glargine (BASAGLAR KWIKPEN) 100 UNIT/ML SOPN INJECT 50 UNITS TOTAL INTO THE SKIN DAILY. 15 pen 0  . Insulin Pen Needle (BD PEN NEEDLE NANO U/F) 32G X 4 MM MISC Please specify directions, refills and quantity 100 each 11  . Insulin Syringe-Needle U-100 (B-D INS SYR ULTRAFINE 1CC/31G) 31G X 5/16" 1 ML MISC 1 Syringe by Does not apply route daily. 100 each 0  . lamoTRIgine (LAMICTAL) 150 MG tablet Take 150 mg by mouth daily.    [name] Device MISC Test blood sugar two times daily 100 each 6  . LORazepam (ATIVAN) 0.5 MG tablet Take 0.5-1 mg by mouth daily as needed.     . meclizine (ANTIVERT) 25 MG tablet Take 1 tablet (25 mg total) by mouth 3 (three) times daily as needed for dizziness. 90 tablet 6  . metFORMIN (GLUCOPHAGE-XR) 500 MG 24 hr tablet TAKE 4 TABLETS BY MOUTH EVERY DAY WITH BREAKFAST 360 tablet 0  . nystatin (MYCOSTATIN/NYSTOP) powder APPLY 1 APPLICATION TO AFFECTED AREA TWICE A DAY (Patient taking differently: as needed. ) 60 g 0  . OnabotulinumtoxinA (BOTOX IJ) Inject as directed  every 3 (three) months. For migraine    . ONETOUCH VERIO test strip TEST BLOOD SUGAR 5 TIMES DAILY 500 each 1  . oxyCODONE-acetaminophen (PERCOCET) 10-325 MG tablet Take 1 tablet by mouth every 6 (six) hours as needed for pain.    . pregabalin (LYRICA) 50 MG capsule Taking no more than 650 mg in the day. Spacing out.    . promethazine (PHENERGAN) 25 MG tablet TAKE 1 TABLET (25 MG TOTAL) BY MOUTH EVERY 8 (EIGHT) HOURS AS NEEDED. 15 tablet 5  .  SUMAtriptan (IMITREX) 100 MG tablet TAKE 1 TABLET BY MOUTH EVERY DAY AS NEEDED MAY REPEAT IN 2HRS IF HEADACHE PERSISTS. MAX 2 TABS/24 HR 9 tablet 0  . SYRINGE-NEEDLE, DISP, 3 ML (BD INTEGRA SYRINGE) 25G X 1" 3 ML MISC FOR USE WITH B12 INJECTIONS WEEKLY/MONTHLY 25 each 0  . Vitamin D, Ergocalciferol, (DRISDOL) 1.25 MG (50000 UT) CAPS capsule TAKE 1 CAPSULE BY MOUTH ONCE A WEEK 12 capsule 1   No current facility-administered medications for this visit.     Allergies:   Amoxicillin, Avocado, Clarithromycin, Honey, Other, Sulfa drugs cross reactors, Watermelon concentrate, Metoprolol, Adhesive [tape], Levemir [insulin detemir], and Victoza [liraglutide]    Social History:  The patient  reports that she quit smoking about 23 years ago. Her smoking use included cigarettes. She has a 4.00 pack-year smoking history. She has never used smokeless tobacco. She reports that she does not drink alcohol or use drugs.   Family History:  The patient's family history includes Breast cancer (age of onset: 34) in her paternal grandmother; Depression in an other family member; Diabetes in her father and mother; Hypertension in her father and mother.    ROS:  Please see the history of present illness.   Otherwise, review of systems are positive for none.   All other systems are reviewed and negative.    PHYSICAL EXAM: VS:  BP 120/90 (BP Location: Left Arm, Patient Position: Sitting, Cuff Size: Large)   Pulse (!) 117   Ht '5\' 4"'$  (1.626 m)   Wt 222 lb 12 oz (101 kg)   LMP  05/09/2016 (LMP Unknown) Comment: D/C  SpO2 98%   BMI 38.23 kg/m  , BMI Body mass index is 38.23 kg/m. GEN: Well nourished, well developed, in no acute distress  HEENT: normal  Neck: no JVD, carotid bruits, or masses Cardiac: Mildly tachycardic; no murmurs, rubs, or gallops,no edema  Respiratory:  clear to auscultation bilaterally, normal work of breathing GI: soft, nontender, nondistended, + BS MS: no deformity or atrophy  Skin: warm and dry, no rash Neuro:  Strength and sensation are intact Psych: euthymic mood, full affect   EKG:  EKG is ordered today. The ekg ordered today demonstrates sinus tachycardia with a PVC, LVH with repolarization abnormalities.   Recent Labs: 05/02/2019: Creatinine, Ser 0.90    Lipid Panel    Component Value Date/Time   CHOL 119 11/22/2017 1609   TRIG 134.0 11/22/2017 1609   HDL 49.90 11/22/2017 1609   CHOLHDL 2 11/22/2017 1609   VLDL 26.8 11/22/2017 1609   LDLCALC 42 11/22/2017 1609   LDLDIRECT 92.0 02/21/2017 1456      Wt Readings from Last 3 Encounters:  08/29/19 222 lb 12 oz (101 kg)  04/30/19 233 lb (105.7 kg)  04/28/19 235 lb 3.2 oz (106.7 kg)        PAD Screen 09/26/2018  Previous PAD dx? No  Previous surgical procedure? No  Pain with walking? Yes  Subsides with rest? Yes  Feet/toe relief with dangling? No  Painful, non-healing ulcers? No  Extremities discolored? No      ASSESSMENT AND PLAN:  1.  Inappropriate sinus tachycardia: Tachycardia improved after recent resumption of diltiazem but still not optimally controlled.  I elected to increase the dose to 180 mg once daily.     2.  Exertional dyspnea : Given persistent tachycardia, we have to exclude tachycardia induced cardiomyopathy.  She was supposed to get an echocardiogram done last year but that did not happen.  I reordered  the echocardiogram.  3.  Essential hypertension: Blood pressure still mildly elevated.  Diltiazem was increased as outlined above. Avoid  vasodilators or diuretics.  4.  Preop evaluation for lumbar steroid injection.  As long as echo is unremarkable, she can proceed at an overall low risk.   Disposition:   FU with me in 6 months.  Signed,  Kathlyn Sacramento, MD  08/29/2019 11:03 AM    [place]

## 2019-09-04 ENCOUNTER — Other Ambulatory Visit: Payer: Self-pay

## 2019-09-04 ENCOUNTER — Ambulatory Visit: Payer: BC Managed Care – PPO | Admitting: Family Medicine

## 2019-09-04 VITALS — BP 150/84 | HR 96 | Temp 98.1°F | Resp 18 | Ht 64.0 in | Wt 219.8 lb

## 2019-09-04 DIAGNOSIS — L0231 Cutaneous abscess of buttock: Secondary | ICD-10-CM

## 2019-09-04 DIAGNOSIS — Z23 Encounter for immunization: Secondary | ICD-10-CM

## 2019-09-04 MED ORDER — CLINDAMYCIN HCL 300 MG PO CAPS: 300.0000 mg | ORAL_CAPSULE | Freq: Three times a day (TID) | ORAL | 0 refills | Status: DC

## 2019-09-04 MED ORDER — DOXYCYCLINE HYCLATE 100 MG PO TABS: 100.0000 mg | ORAL_TABLET | Freq: Two times a day (BID) | ORAL | 0 refills | Status: DC

## 2019-09-04 NOTE — Progress Notes (Signed)
Subjective:    Patient ID: Maria Bentley, female    DOB: February 28, 1971, 48 y.o.   MRN: [id number]  HPI   Patient presents to clinic due to recurrence of abscess on buttock, believes it now has 2 locations one on right buttock cheek and other on left buttock cheek.  States they did open and begin to drain on their own, and once it began to drain pressure was relieved.  Denies any fever or chills.  Denies nausea, vomiting or diarrhea.  Patient had similar issue back in May 2020 and patient was sent to surgeon for evaluation.  CT scan was performed by surgeon and results are attached/highlighted in maroon color for this note.  CLINICAL DATA: Abscess of right buttock.   EXAM:  CT PELVIS WITH CONTRAST   TECHNIQUE:  Multidetector CT imaging of the pelvis was performed using the  standard protocol following the bolus administration of intravenous  contrast.   CONTRAST: 173mL OMNIPAQUE IOHEXOL 300 MG/ML SOLN   COMPARISON: CT scan of March 29, 2007.   FINDINGS:  Urinary Tract: No abnormality visualized.   Bowel: Unremarkable visualized pelvic bowel loops.   Vascular/Lymphatic: No pathologically enlarged lymph nodes. No  significant vascular abnormality seen.   Reproductive: No mass or other significant abnormality   Other: Mild inflammatory changes are seen involving the subcutaneous  tissues of the right buttocks, but no abscess or fluid collection is  noted.   Musculoskeletal: No suspicious bone lesions identified.   IMPRESSION:  Mild inflammatory changes are seen involving subcutaneous tissues of  right buttocks consistent with cellulitis, but no underlying abscess  or fluid collection is noted. No other abnormality seen in the  pelvis.    Electronically Signed  By: Marijo Conception M.D.  On: 05/02/2019 09:20   Patient Active Problem List   Diagnosis Date Noted  . Abscess of right buttock 04/30/2019  . Chronic pain 06/05/2018  . Iron deficiency anemia  06/05/2018  . Diarrhea of presumed infectious origin 04/21/2018  . Sepsis ([place]) 03/11/2018  . Paronychia of great toe of left foot 11/24/2017  . Hypertension 11/24/2017  . Pelvic mass in female 04/05/2017  . Tendonitis 03/30/2016  . Left ankle sprain 03/30/2016  . Peroneal tendonitis 01/13/2016  . Hip pain, chronic 12/28/2015  . Pain in joint, ankle and foot 12/28/2015  . Constipation 12/27/2015  . 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 Noncompliance with diabetes treatment 06/08/2015  . Right hip pain 05/30/2014  . Vitamin D deficiency 05/30/2014  . Tachycardia 08/15/2013  . Diabetes mellitus type 2, uncontrolled ([place]) 08/15/2013  . 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  . Dyspnea 07/15/2013  . Morbid obesity ([place]) 06/09/2012  . Anxiety   . Headache, common migraine, intractable   . Hyperlipidemia   . OSA (obstructive sleep apnea) 02/05/2012   Social History   Tobacco Use  . Smoking status: Former Smoker    Packs/day: 1.00    Years: 4.00    Pack years: 4.00    Types: Cigarettes    Quit date: 02/05/1996    Years since quitting: 23.5  . Smokeless tobacco: Never Used  Substance Use Topics  . Alcohol use: No   Review of Systems  Constitutional: Negative for chills, fatigue and fever.  HENT: Negative for congestion, ear pain, sinus pain and sore throat.   Eyes: Negative.   Respiratory: Negative for cough, shortness of breath and wheezing.   Cardiovascular: Negative for chest pain, palpitations and leg swelling.  Gastrointestinal: Negative for abdominal  pain, diarrhea, nausea and vomiting.  Genitourinary: Negative for dysuria, frequency and urgency.  Musculoskeletal: Negative for arthralgias and myalgias.  Skin: ?abscess buttock Neurological: Negative for syncope, light-headedness and headaches.  Psychiatric/Behavioral: The patient is not nervous/anxious.       Objective:   Physical Exam Vitals signs and nursing note reviewed.  Constitutional:       General: She is not in acute distress.    Appearance: She is obese. She is not ill-appearing, toxic-appearing or diaphoretic.  Cardiovascular:     Rate and Rhythm: Normal rate and regular rhythm.  Pulmonary:     Effort: Pulmonary effort is normal. No respiratory distress.     Breath sounds: Normal breath sounds.  Genitourinary:      Comments: Small abscess is located on bilateral buttock cheeks.  Both are already open and draining on their own.  I am able to squeeze them and express a little bit more pus drainage.  Rounding skin redness present. Neurological:     Mental Status: She is alert and oriented to person, place, and time.     Gait: Gait normal.  Psychiatric:        Mood and Affect: Mood normal.        Behavior: Behavior normal.          Wt Readings from Last 3 Encounters:  09/04/19 219 lb 12.8 oz (99.7 kg)  08/29/19 222 lb 12 oz (101 kg)  04/30/19 233 lb (105.7 kg)   Today's Vitals   09/04/19 1348  BP: (!) 150/84  Pulse: 96  Resp: 18  Temp: 98.1 F (36.7 C)  TempSrc: Oral  SpO2: 97%  Weight: 219 lb 12.8 oz (99.7 kg)  Height: 5\' 4"  (1.626 m)   Body mass index is 37.73 kg/m.      Assessment & Plan:    Abscess on right and left buttock cheek-we will cover patient with antibiotics, she will take doxycycline clindamycin for broad-spectrum coverage.  I will refer back to general surgery for evaluation and possible removal due to areas recurring.  Flu vaccine given in clinic today  Patient will keep all regular follow-ups with PCP as planned return to clinic sooner if any issues arise.

## 2019-09-04 NOTE — Patient Instructions (Signed)
Wt Readings from Last 3 Encounters:  09/04/19 219 lb 12.8 oz (99.7 kg)  08/29/19 222 lb 12 oz (101 kg)  04/30/19 233 lb (105.7 kg)

## 2019-09-14 ENCOUNTER — Other Ambulatory Visit: Payer: Self-pay | Admitting: Internal Medicine

## 2019-09-16 ENCOUNTER — Ambulatory Visit: Payer: BC Managed Care – PPO | Admitting: General Surgery

## 2019-09-18 ENCOUNTER — Ambulatory Visit: Payer: BC Managed Care – PPO | Admitting: General Surgery

## 2019-09-18 ENCOUNTER — Other Ambulatory Visit: Payer: Self-pay

## 2019-09-18 ENCOUNTER — Encounter: Payer: Self-pay | Admitting: General Surgery

## 2019-09-18 ENCOUNTER — Telehealth: Payer: Self-pay | Admitting: Internal Medicine

## 2019-09-18 VITALS — BP 159/103 | HR 125 | Temp 97.5°F | Resp 14 | Ht 64.0 in | Wt 219.4 lb

## 2019-09-18 DIAGNOSIS — L0231 Cutaneous abscess of buttock: Secondary | ICD-10-CM

## 2019-09-18 NOTE — Patient Instructions (Addendum)
Schedule your appointment with Cardiology. They may fax all reports and notes to [phone].  Our surgery scheduler will call you to schedule surgery.   Please refer to your [facility] Surgery Sheet.   We will send a request to Dr [name] and also to Dr Fletcher Anon for surgery clearance.

## 2019-09-18 NOTE — Telephone Encounter (Signed)
They have both your 4 and your 4:30 slots marked as virtual visits only and her appt is scheduled for in office at [place] was wanting to see if it was okay to leave her as an in office visit or does she need to be switched to a virtual?

## 2019-09-18 NOTE — Telephone Encounter (Signed)
Can pt come into office at 4pm tomorrow as scheduled? This appointment has already been rescheduled once before.

## 2019-09-18 NOTE — Telephone Encounter (Signed)
Keep it face to face. She is overdue for diabetes follow up and  Needs labs!!!!!  Lab Results  Component Value Date   HGBA1C 8.7 (A) 08/19/2018

## 2019-09-18 NOTE — Telephone Encounter (Signed)
I have no idea why I am being asked this question.  I 'm inclined to say yes unless the request has additional info that was not forwarded to me

## 2019-09-19 ENCOUNTER — Ambulatory Visit: Payer: BC Managed Care – PPO | Admitting: Internal Medicine

## 2019-09-19 ENCOUNTER — Other Ambulatory Visit: Payer: Self-pay

## 2019-09-19 ENCOUNTER — Encounter: Payer: Self-pay | Admitting: Internal Medicine

## 2019-09-19 VITALS — BP 150/86 | HR 97 | Temp 96.8°F | Resp 15 | Ht 64.0 in | Wt 220.1 lb

## 2019-09-19 DIAGNOSIS — E11649 Type 2 diabetes mellitus with hypoglycemia without coma: Secondary | ICD-10-CM

## 2019-09-19 DIAGNOSIS — D508 Other iron deficiency anemias: Secondary | ICD-10-CM

## 2019-09-19 DIAGNOSIS — R5383 Other fatigue: Secondary | ICD-10-CM

## 2019-09-19 DIAGNOSIS — E559 Vitamin D deficiency, unspecified: Secondary | ICD-10-CM

## 2019-09-19 DIAGNOSIS — E7800 Pure hypercholesterolemia, unspecified: Secondary | ICD-10-CM

## 2019-09-19 DIAGNOSIS — Z01818 Encounter for other preprocedural examination: Secondary | ICD-10-CM

## 2019-09-19 DIAGNOSIS — E538 Deficiency of other specified B group vitamins: Secondary | ICD-10-CM

## 2019-09-19 LAB — POCT GLYCOSYLATED HEMOGLOBIN (HGB A1C): Hemoglobin A1C: 14.7 % — AB (ref 4.0–5.6)

## 2019-09-19 MED ORDER — TRAMADOL HCL 50 MG PO TABS: 50.0000 mg | ORAL_TABLET | Freq: Four times a day (QID) | ORAL | 0 refills | Status: DC | PRN

## 2019-09-19 MED ORDER — FREESTYLE LIBRE 14 DAY SENSOR MISC: 1.0000 "application " | Freq: Four times a day (QID) | 11 refills | Status: DC | PRN

## 2019-09-19 NOTE — Progress Notes (Signed)
Subjective:  Patient ID: Maria Bentley, female    DOB: Nov 07, 1971  Age: 48 y.o. MRN: [id number]  CC: The primary encounter diagnosis was Uncontrolled type 2 diabetes mellitus with hypoglycemia without coma ([place]). Diagnoses of Pure hypercholesterolemia, Other iron deficiency anemia, B12 deficiency, Fatigue, unspecified type, Vitamin D deficiency, and Preoperative evaluation of a medical condition to rule out surgical contraindications (TAR required) were also pertinent to this visit.  HPI Maria Bentley presents for  Follow up on uncontrolled diabetes and other related issues   Last seen for diabetes follow up i n June 2019   Diagnosed with recurrent bilateral  buttock  Abscesses,  Needing surgical exploration udner anesthesia to ule out fistulas.  During evaluation was noted to be tachycardia, referred to  cardiology for tachycardia, ECHO planned  For Nov 13th,  on waiting list for earlier .  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 Stopped taking insulin several months ago,  Not checking blood sugars. Feels overwhelmed by deaths in family having to manage multiple estates. Was seeing a pyschotherapist for 5 months last year,  but she was murdered .  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  tramadol requested     Outpatient Medications Prior to Visit  Medication Sig Dispense Refill  . acetaminophen (TYLENOL) 325 MG tablet Take by mouth.    Marland Kitchen albuterol (VENTOLIN HFA) 108 (90 Base) MCG/ACT inhaler Inhale 2 puffs into the lungs every 4 (four) hours as needed for wheezing or shortness of breath. 18 g 3  . atorvastatin (LIPITOR) 20 MG tablet  TAKE 1 TABLET BY MOUTH EVERY DAY 90 tablet 0  . B-D INS SYR MICROFINE 1CC/28G 28G X 1/2" 1 ML MISC USE 1 SYRINGE DAILY AFTER SUPPER 100 each 1  . blood glucose meter kit and supplies KIT Dispense based on patient and insurance preference. Use up to four times daily as directed. (FOR ICD-E11.21) 1 each 0  . buPROPion (WELLBUTRIN XL) 150 MG 24 hr tablet TAKE 3 TABLETS BY MOUTH IN THE MORNING  3  . clindamycin (CLEOCIN) 300 MG capsule Take 1 capsule (300 mg total) by mouth 3 (three) times daily. 30 capsule 0  . Continuous Blood Gluc Receiver (FREESTYLE LIBRE 14 DAY READER) DEVI 1 applicator by Does not apply route 4 (four) times daily. 1 Device 1  . cyanocobalamin (,VITAMIN B-12,) 1000 MCG/ML injection INJECT 1 ML (1,000 MCG TOTAL) INTO THE MUSCLE ONCE A WEEK. FOR 4 WEEKS, THEN MONTHLY THEREAFTER 6 mL 4  . cyclobenzaprine (FLEXERIL) 10 MG tablet TAKE 1/2 TO 1 UP TO THREE TIMES DAILY AS NEEDED. DROWSINESS PRECAUTIONS  12  . diltiazem (CARDIZEM CD) 180 MG 24 hr capsule TAKE 1 CAPSULE BY MOUTH EVERY DAY 90 capsule 2  . EPINEPHrine 0.3 mg/0.3 mL IJ SOAJ injection     . escitalopram (LEXAPRO) 20 MG tablet Take 20 mg by mouth daily.     . fluticasone (FLONASE) 50 MCG/ACT nasal spray SPRAY 2 SPRAYS INTO EACH NOSTRIL EVERY DAY 16 mL 1  . INS SYRINGE/NEEDLE 1CC/28G (B-D INS SYR MICROFINE 1CC/28G) 28G X 1/2" 1 ML MISC USE 1 SYRINGE DAILY AFTER SUPPER 100 each  3  . insulin aspart (NOVOLOG FLEXPEN) 100 UNIT/ML FlexPen Inject 15 units + sliding scale under the skin with meals. Add 3-5 units for every blood sugar 50 mg/dL over 150. Max dose of 30 units with meals. 30 mL 2  . Insulin Glargine (BASAGLAR KWIKPEN) 100 UNIT/ML SOPN INJECT 50 UNITS TOTAL INTO THE SKIN DAILY. 45 pen 0  . Insulin Pen Needle (BD PEN NEEDLE NANO U/F) 32G X 4 MM MISC Please specify directions, refills and quantity 100 each 11  . Insulin Syringe-Needle U-100 (B-D INS SYR ULTRAFINE 1CC/31G) 31G X 5/16" 1 ML MISC 1 Syringe by Does not apply route  daily. 100 each 0  . lamoTRIgine (LAMICTAL) 150 MG tablet Take 150 mg by mouth daily.    [name] Device MISC Test blood sugar two times daily 100 each 6  . LORazepam (ATIVAN) 0.5 MG tablet Take 0.5-1 mg by mouth daily as needed.     . meclizine (ANTIVERT) 25 MG tablet Take 1 tablet (25 mg total) by mouth 3 (three) times daily as needed for dizziness. 90 tablet 6  . nystatin (MYCOSTATIN/NYSTOP) powder APPLY 1 APPLICATION TO AFFECTED AREA TWICE A DAY (Patient taking differently: as needed. ) 60 g 0  . OnabotulinumtoxinA (BOTOX IJ) Inject as directed every 3 (three) months. For migraine    . ONETOUCH VERIO test strip TEST BLOOD SUGAR 5 TIMES DAILY 500 each 1  . oxyCODONE-acetaminophen (PERCOCET) 10-325 MG tablet Take 1 tablet by mouth every 6 (six) hours as needed for pain.    Marland Kitchen SYRINGE-NEEDLE, DISP, 3 ML (BD INTEGRA SYRINGE) 25G X 1" 3 ML MISC FOR USE WITH B12 INJECTIONS WEEKLY/MONTHLY 25 each 0  . Vitamin D, Ergocalciferol, (DRISDOL) 1.25 MG (50000 UT) CAPS capsule TAKE 1 CAPSULE BY MOUTH ONCE A WEEK 12 capsule 1  . Continuous Blood Gluc Sensor (FREESTYLE LIBRE 14 DAY SENSOR) MISC USE TO TEST BLOOD SUGAR 4 TIMES DAILY. CHANGING EVERY 14 DAYS 2 each 1  . metFORMIN (GLUCOPHAGE-XR) 500 MG 24 hr tablet TAKE 4 TABLETS BY MOUTH EVERY DAY WITH BREAKFAST (Patient not taking: Reported on 09/19/2019) 360 tablet 0  . doxycycline (VIBRA-TABS) 100 MG tablet Take 1 tablet (100 mg total) by mouth 2 (two) times daily. (Patient not taking: Reported on 09/19/2019) 20 tablet 0   No facility-administered medications prior to visit.     Review of Systems;  Patient denies headache, fevers, malaise, unintentional weight loss, skin rash, eye pain, sinus congestion and sinus pain, sore throat, dysphagia,  hemoptysis , cough, dyspnea, wheezing, chest pain, palpitations, orthopnea, edema, abdominal pain, nausea, melena, diarrhea, constipation, flank pain, dysuria, hematuria, urinary  Frequency, nocturia, numbness, tingling,  seizures,  Focal weakness, Loss of consciousness,  Tremor, insomnia, depression, anxiety, and suicidal ideation.      Objective:  BP (!) 150/86 (BP Location: Left Arm, Patient Position: Sitting, Cuff Size: Normal)   Pulse 97   Temp (!) 96.8 F (36 C) (Temporal)   Resp 15   Ht 5' 4"  (1.626 m)   Wt 220 lb 1.9 oz (99.8 kg)   LMP 05/09/2016 (LMP Unknown) Comment: D/C  SpO2 95%   BMI 37.78 kg/m   BP Readings from Last 3 Encounters:  09/19/19 (!) 150/86  09/18/19 (!) 159/103  09/04/19 (!) 150/84    Wt Readings from Last 3 Encounters:  09/19/19 220 lb 1.9 oz (99.8 kg)  09/18/19 219 lb 6.4 oz (99.5 kg)  09/04/19 219 lb 12.8 oz (99.7 kg)    General appearance: alert, cooperative  and appears stated age Ears: normal TM's and external ear canals both ears Throat: lips, mucosa, and tongue normal; teeth and gums normal Neck: no adenopathy, no carotid bruit, supple, symmetrical, trachea midline and thyroid not enlarged, symmetric, no tenderness/mass/nodules Back: symmetric, no curvature. ROM normal. No CVA tenderness. Lungs: clear to auscultation bilaterally Heart: regular rate and rhythm, S1, S2 normal, no murmur, click, rub or gallop Abdomen: soft, non-tender; bowel sounds normal; no masses,  no organomegaly Pulses: 2+ and symmetric Skin: Skin color, texture, turgor normal. No rashes or lesions Lymph nodes: Cervical, supraclavicular, and axillary nodes normal.  Lab Results  Component Value Date   HGBA1C 14.7 (A) 09/19/2019   HGBA1C 8.7 (A) 08/19/2018   HGBA1C 11.5 (H) 04/19/2018    Lab Results  Component Value Date   CREATININE 1.16 (H) 09/19/2019   CREATININE 0.90 05/02/2019   CREATININE 1.16 04/19/2018    Lab Results  Component Value Date   WBC 10.9 (H) 09/19/2019   HGB 14.9 09/19/2019   HCT 46.8 (H) 09/19/2019   PLT 455 (H) 09/19/2019   GLUCOSE 331 (H) 09/19/2019   CHOL 194 09/19/2019   TRIG 391 (H) 09/19/2019   HDL 48 (L) 09/19/2019   LDLDIRECT 92.0 02/21/2017    LDLCALC 94 09/19/2019   ALT 13 09/19/2019   AST 10 09/19/2019   NA 135 09/19/2019   K 4.0 09/19/2019   CL 100 09/19/2019   CREATININE 1.16 (H) 09/19/2019   BUN 20 09/19/2019   CO2 22 09/19/2019   TSH 2.84 09/19/2019   HGBA1C 14.7 (A) 09/19/2019   MICROALBUR 1.8 09/19/2019    Ct Pelvis W Contrast  Result Date: 05/02/2019 CLINICAL DATA:  Abscess of right buttock. EXAM: CT PELVIS WITH CONTRAST TECHNIQUE: Multidetector CT imaging of the pelvis was performed using the standard protocol following the bolus administration of intravenous contrast. CONTRAST:  1103m OMNIPAQUE IOHEXOL 300 MG/ML  SOLN COMPARISON:  CT scan of March 29, 2007. FINDINGS: Urinary Tract:  No abnormality visualized. Bowel:  Unremarkable visualized pelvic bowel loops. Vascular/Lymphatic: No pathologically enlarged lymph nodes. No significant vascular abnormality seen. Reproductive:  No mass or other significant abnormality Other: Mild inflammatory changes are seen involving the subcutaneous tissues of the right buttocks, but no abscess or fluid collection is noted. Musculoskeletal: No suspicious bone lesions identified. IMPRESSION: Mild inflammatory changes are seen involving subcutaneous tissues of right buttocks consistent with cellulitis, but no underlying abscess or fluid collection is noted. No other abnormality seen in the pelvis. Electronically Signed   By: JMarijo ConceptionM.D.   On: 05/02/2019 09:20    Assessment & Plan:   Problem List Items Addressed This Visit      Unprioritized   Vitamin D deficiency   Relevant Orders   VITAMIN D 25 Hydroxy (Vit-D Deficiency, Fractures) (Completed)   Hyperlipidemia   Relevant Orders   Lipid panel (Completed)   Iron deficiency anemia   Relevant Orders   CBC with Differential/Platelet (Completed)   Diabetes mellitus type 2, uncontrolled ([place] - Primary    Uncontrolled due to patient being lost to follow up and suspensionof medications.  will resume basal insulin at 20 units  daily (dose prior to quitting ws 50 units)  and adjust weekly       Relevant Orders   POCT HgB A1C (Completed)   Comprehensive metabolic panel (Completed)   Microalbumin / creatinine urine ratio (Completed)   Preoperative evaluation of a medical condition to rule out surgical contraindications (TAR required)    Patient  is considered to be at moderate to high risk  For perioperative complications  Based on today's exam and history.  Baseline lytes,  hgb and ekg done..  Sugars need to be < 200 to to impact proper hearing        Other Visit Diagnoses    B12 deficiency       Relevant Orders   Vitamin B12 (Completed)   Fatigue, unspecified type       Relevant Orders   TSH (Completed)      I have discontinued Derek Jack. Stetler "Cindy"'s doxycycline. I have also changed her FreeStyle Libre 14 Day Sensor. Additionally, I am having her start on traMADol. Lastly, I am having her maintain her Lancet Device, Insulin Syringe-Needle U-100, OnabotulinumtoxinA (BOTOX IJ), LORazepam, meclizine, oxyCODONE-acetaminophen, B-D INS SYR MICROFINE 1CC/28G, blood glucose meter kit and supplies, EPINEPHrine, escitalopram, acetaminophen, FreeStyle Libre 14 Day Reader, cyclobenzaprine, buPROPion, INS SYRINGE/NEEDLE 1CC/28G, SYRINGE-NEEDLE (DISP) 3 ML, insulin aspart, Insulin Pen Needle, OneTouch Verio, albuterol, cyanocobalamin, nystatin, Vitamin D (Ergocalciferol), atorvastatin, metFORMIN, fluticasone, lamoTRIgine, diltiazem, clindamycin, and Basaglar KwikPen.  Meds ordered this encounter  Medications  . Continuous Blood Gluc Sensor (FREESTYLE LIBRE 14 DAY SENSOR) MISC    Sig: Inject 1 application into the skin 4 (four) times daily as needed (uncontrolled diabetes).    Dispense:  2 each    Refill:  11    REFILLS FOR FREESTYLE LIBRE  . traMADol (ULTRAM) 50 MG tablet    Sig: Take 1 tablet (50 mg total) by mouth every 6 (six) hours as needed for up to 5 days for moderate pain.    Dispense:  30 tablet     Refill:  0    Medications Discontinued During This Encounter  Medication Reason  . doxycycline (VIBRA-TABS) 100 MG tablet Completed Course  . Continuous Blood Gluc Sensor (FREESTYLE LIBRE 14 DAY SENSOR) MISC Reorder    Follow-up: No follow-ups on file.   [name], MD

## 2019-09-19 NOTE — Progress Notes (Signed)
Patient ID: Maria Bentley, female   DOB: [birth year], 49 y.o.   MRN: [id number]  Chief Complaint  Patient presents with  . Follow-up    abscess buttocks    HPI Maria Bentley is a 48 y.o. female.   I first saw her in May of this year.  My initial HPI is copied here:  "She was referred by her primary care doctor's office for evaluation of a right buttock abscess.  She states that it first appeared about 2-1/2 weeks ago.  She sought the attention of her primary care provider.  The nurse practitioner in the office "nicked" it and it drained pus about 1-1/2 weeks ago.  It has still been draining serous fluid mixed with a little bit of blood since that time.  She also noticed a second firm area more medial to the wound and closer to the anus.  This is also somewhat tender.  She saw the nurse practitioner again yesterday.  Doxycycline was added to the clindamycin she was already taking, due to the persistence of this site.  The NP thought that the area did not look good and sent her to general surgery for further evaluation.  She has not had any fevers or chills.  She denies nausea or vomiting.  She is diabetic.  I do not see a recent A1c on her, however the most recent test obtained in September 2019, was elevated at 8.7.  He says that she has previously had an abscess in this same location that was treated with simple incision and drainage."  At that visit, there was no drainable abscess present.  Due to a new lump that arose more medially just prior to that visit, there was some concern as to whether or not she had an additional abscess or potentially a sinus or fistula.  A CT scan of the pelvis was ordered.  There was no evidence of abscess or other fluid collection.  No sinus tract or fistula was identified.  The only finding was cellulitis in the area of the open skin wound.  She was recently seen in her primary care provider's office.  A new abscess site had opened up on her left  buttock.  She was prescribed antibiotics and referred back to general surgery for further evaluation.  Currently, there is minimal clear drainage from the site, though it remains tender to touch.  She denies any difficulty with bowel movements.  No fevers or chills.  No nausea or vomiting.  She is unable to perform sitz bath's but does apply warm compresses.  She has nearly completed her course of antibiotics and reports that both the doxycycline and clindamycin have helped a lot.   Past Medical History:  Diagnosis Date  . Anemia   . Anxiety   . Depression 2010  . Diabetes mellitus   . Endometriosis   . Headache, common migraine, intractable   . History of ovarian cyst    left sided, caused by her endometriosis (possibly an endometrioma), in her 5s.  Marland Kitchen Hyperlipidemia   . Hypertension   . Kidney stones    left  . Morbid obesity ([place])    s/p gastric bypass  . Personal hx of gastric bypass 2010  . Rosacea   . Vertigo     Past Surgical History:  Procedure Laterality Date  . DILATION AND CURETTAGE OF UTERUS  2011  . gastric bypass    . OVARIAN CYST REMOVAL    . ROTATOR CUFF REPAIR  right    Family History  Problem Relation Age of Onset  . Diabetes Mother   . Hypertension Mother   . Hypertension Father   . Diabetes Father   . Depression Other        strong hx of  . Breast cancer Paternal Grandmother 28  . Cancer Neg Hx     Social History Social History   Tobacco Use  . Smoking status: Former Smoker    Packs/day: 1.00    Years: 4.00    Pack years: 4.00    Types: Cigarettes    Quit date: 02/05/1996    Years since quitting: 23.6  . Smokeless tobacco: Never Used  Substance Use Topics  . Alcohol use: No  . Drug use: No    Allergies  Allergen Reactions  . Amoxicillin Hives and Rash  . Avocado Swelling  . Clarithromycin Hives and Rash  . Honey Anaphylaxis  . Other Hives and Swelling    All melon  . Sulfa Drugs Cross Reactors Anaphylaxis  . Watermelon  Concentrate Hives and Swelling    Oral swelling, ears swell shut  . Metoprolol Other (See Comments)    Hypotension  . Adhesive [Tape] Itching    Skin breakdown  . Levemir [Insulin Detemir] Rash    Red/swollen injection site reactions with residual lumps  . Victoza [Liraglutide] Nausea Only    Current Outpatient Medications  Medication Sig Dispense Refill  . acetaminophen (TYLENOL) 325 MG tablet Take by mouth.    Marland Kitchen albuterol (VENTOLIN HFA) 108 (90 Base) MCG/ACT inhaler Inhale 2 puffs into the lungs every 4 (four) hours as needed for wheezing or shortness of breath. 18 g 3  . atorvastatin (LIPITOR) 20 MG tablet TAKE 1 TABLET BY MOUTH EVERY DAY 90 tablet 0  . B-D INS SYR MICROFINE 1CC/28G 28G X 1/2" 1 ML MISC USE 1 SYRINGE DAILY AFTER SUPPER 100 each 1  . blood glucose meter kit and supplies KIT Dispense based on patient and insurance preference. Use up to four times daily as directed. (FOR ICD-E11.21) 1 each 0  . buPROPion (WELLBUTRIN XL) 150 MG 24 hr tablet TAKE 3 TABLETS BY MOUTH IN THE MORNING  3  . clindamycin (CLEOCIN) 300 MG capsule Take 1 capsule (300 mg total) by mouth 3 (three) times daily. 30 capsule 0  . Continuous Blood Gluc Receiver (FREESTYLE LIBRE 14 DAY READER) DEVI 1 applicator by Does not apply route 4 (four) times daily. 1 Device 1  . Continuous Blood Gluc Sensor (FREESTYLE LIBRE 14 DAY SENSOR) MISC USE TO TEST BLOOD SUGAR 4 TIMES DAILY. CHANGING EVERY 14 DAYS 2 each 1  . cyanocobalamin (,VITAMIN B-12,) 1000 MCG/ML injection INJECT 1 ML (1,000 MCG TOTAL) INTO THE MUSCLE ONCE A WEEK. FOR 4 WEEKS, THEN MONTHLY THEREAFTER 6 mL 4  . cyclobenzaprine (FLEXERIL) 10 MG tablet TAKE 1/2 TO 1 UP TO THREE TIMES DAILY AS NEEDED. DROWSINESS PRECAUTIONS  12  . diltiazem (CARDIZEM CD) 180 MG 24 hr capsule TAKE 1 CAPSULE BY MOUTH EVERY DAY 90 capsule 2  . doxycycline (VIBRA-TABS) 100 MG tablet Take 1 tablet (100 mg total) by mouth 2 (two) times daily. 20 tablet 0  . EPINEPHrine 0.3 mg/0.3  mL IJ SOAJ injection     . escitalopram (LEXAPRO) 20 MG tablet Take 20 mg by mouth daily.     . fluticasone (FLONASE) 50 MCG/ACT nasal spray SPRAY 2 SPRAYS INTO EACH NOSTRIL EVERY DAY 16 mL 1  . INS SYRINGE/NEEDLE 1CC/28G (B-D INS SYR MICROFINE  1CC/28G) 28G X 1/2" 1 ML MISC USE 1 SYRINGE DAILY AFTER SUPPER 100 each 3  . insulin aspart (NOVOLOG FLEXPEN) 100 UNIT/ML FlexPen Inject 15 units + sliding scale under the skin with meals. Add 3-5 units for every blood sugar 50 mg/dL over 150. Max dose of 30 units with meals. 30 mL 2  . Insulin Glargine (BASAGLAR KWIKPEN) 100 UNIT/ML SOPN INJECT 50 UNITS TOTAL INTO THE SKIN DAILY. 45 pen 0  . Insulin Pen Needle (BD PEN NEEDLE NANO U/F) 32G X 4 MM MISC Please specify directions, refills and quantity 100 each 11  . Insulin Syringe-Needle U-100 (B-D INS SYR ULTRAFINE 1CC/31G) 31G X 5/16" 1 ML MISC 1 Syringe by Does not apply route daily. 100 each 0  . lamoTRIgine (LAMICTAL) 150 MG tablet Take 150 mg by mouth daily.    [name] Device MISC Test blood sugar two times daily 100 each 6  . LORazepam (ATIVAN) 0.5 MG tablet Take 0.5-1 mg by mouth daily as needed.     . meclizine (ANTIVERT) 25 MG tablet Take 1 tablet (25 mg total) by mouth 3 (three) times daily as needed for dizziness. 90 tablet 6  . metFORMIN (GLUCOPHAGE-XR) 500 MG 24 hr tablet TAKE 4 TABLETS BY MOUTH EVERY DAY WITH BREAKFAST 360 tablet 0  . nystatin (MYCOSTATIN/NYSTOP) powder APPLY 1 APPLICATION TO AFFECTED AREA TWICE A DAY (Patient taking differently: as needed. ) 60 g 0  . OnabotulinumtoxinA (BOTOX IJ) Inject as directed every 3 (three) months. For migraine    . ONETOUCH VERIO test strip TEST BLOOD SUGAR 5 TIMES DAILY 500 each 1  . oxyCODONE-acetaminophen (PERCOCET) 10-325 MG tablet Take 1 tablet by mouth every 6 (six) hours as needed for pain.    Marland Kitchen SYRINGE-NEEDLE, DISP, 3 ML (BD INTEGRA SYRINGE) 25G X 1" 3 ML MISC FOR USE WITH B12 INJECTIONS WEEKLY/MONTHLY 25 each 0  . Vitamin D, Ergocalciferol,  (DRISDOL) 1.25 MG (50000 UT) CAPS capsule TAKE 1 CAPSULE BY MOUTH ONCE A WEEK 12 capsule 1   No current facility-administered medications for this visit.     Review of Systems Review of Systems  Respiratory: Positive for shortness of breath.   Cardiovascular:       Tachycardia; she was diagnosed with inappropriate sinus tachycardia recently.  She requires further evaluation per the cardiologist notes.  Neurological: Positive for dizziness.  All other systems reviewed and are negative.   Blood pressure (!) 159/103, pulse (!) 125, temperature (!) 97.5 F (36.4 C), temperature source Temporal, resp. rate 14, height 5' 4"  (1.626 m), weight 219 lb 6.4 oz (99.5 kg), last menstrual period 05/09/2016, SpO2 94 %.  Physical Exam Physical Exam Constitutional:      General: She is not in acute distress.    Appearance: Normal appearance. She is obese.  HENT:     Head: Normocephalic and atraumatic.     Nose:     Comments: Covered with a mask secondary to COVID-19 precautions    Mouth/Throat:     Comments: Covered with a mask secondary to COVID-19 precautions Eyes:     General: No scleral icterus.       Right eye: No discharge.        Left eye: No discharge.     Conjunctiva/sclera: Conjunctivae normal.  Cardiovascular:     Rate and Rhythm: Regular rhythm. Tachycardia present.  Pulmonary:     Effort: Pulmonary effort is normal.  Abdominal:     Palpations: Abdomen is soft.     Comments:  Protuberant, consistent with her level of obesity.  Genitourinary:      Comments: On the right, there is some scar and induration from her abscess in May.  On the left, the area is more indurated and slightly erythematous.  It is more tender than the right.  I do not appreciate any external evidence of a fistula. Musculoskeletal:        General: No swelling or deformity.  Lymphadenopathy:     Cervical: No cervical adenopathy.  Skin:    General: Skin is warm and dry.  Neurological:     General: No  focal deficit present.     Mental Status: She is alert and oriented to person, place, and time.  Psychiatric:        Mood and Affect: Mood normal.        Behavior: Behavior normal.     Data Reviewed I reviewed the recent cardiology note from Dr. Fletcher Anon, on 08/29/2019.  At that visit, he increased her diltiazem, due to persistent tachycardia.  He has recommended that she undergo echocardiogram; this has not yet been performed.  I also reviewed Lauren Guse's note from 09/04/2019, wherein she prescribed the antibiotics Ms. Mcmasters is taking.  Assessment This is a 48 year old morbidly obese female with diabetes.  She has recurrent abscesses on her buttocks.  On inspection, they appear to be potentially secondary to friction and abrasion of the skin, as they are immediately opposed to a tether on opposite sides.  However, due to the recurrence multiple times, I need to rule out the possibility of a fistula.  As our CT scan was unrevealing, I have recommended that we perform an examination under anesthesia with possible treatment of any fistula or drainage of any abscess identified at that time.  Plan She will require preoperative medical and cardiac clearance.  Once this is been obtained, we will schedule her for a likely outpatient procedure, examination under anesthesia with possible fistulotomy, possible seton placement, possible incision and drainage of abscess, possible fibrin glue application.  I indicated to her that if I found a complex fistula involving the sphincter muscles, I would probably elect to not perform any therapeutic intervention and instead refer her to colon and rectal surgery.  She agrees with this course of action.  We will work on getting everything arranged.    Fredirick Maudlin 09/19/2019, 1:56 PM

## 2019-09-19 NOTE — Patient Instructions (Signed)
Resume basaglar insulin starting with 20 units daily  Start checking sugars 4 times daily:  Fasting Pre lunch predinner Bedtime  Send me readings in one week   Ask your husband for help in filling your pill box

## 2019-09-19 NOTE — Progress Notes (Signed)
Medical clearance has been faxed to patient's PCP Dr [name]. [place]: [phone] Fax: [phone]

## 2019-09-20 LAB — CBC WITH DIFFERENTIAL/PLATELET
Absolute Monocytes: 665 cells/uL (ref 200–950)
Basophils Absolute: 44 cells/uL (ref 0–200)
Basophils Relative: 0.4 %
Eosinophils Absolute: 174 cells/uL (ref 15–500)
Eosinophils Relative: 1.6 %
HCT: 46.8 % — ABNORMAL HIGH (ref 35.0–45.0)
Hemoglobin: 14.9 g/dL (ref 11.7–15.5)
Lymphs Abs: 3695 cells/uL (ref 850–3900)
MCH: 29.5 pg (ref 27.0–33.0)
MCHC: 31.8 g/dL — ABNORMAL LOW (ref 32.0–36.0)
MCV: 92.7 fL (ref 80.0–100.0)
MPV: 10.8 fL (ref 7.5–12.5)
Monocytes Relative: 6.1 %
Neutro Abs: 6322 cells/uL (ref 1500–7800)
Neutrophils Relative %: 58 %
Platelets: 455 10*3/uL — ABNORMAL HIGH (ref 140–400)
RBC: 5.05 10*6/uL (ref 3.80–5.10)
RDW: 13.1 % (ref 11.0–15.0)
Total Lymphocyte: 33.9 %
WBC: 10.9 10*3/uL — ABNORMAL HIGH (ref 3.8–10.8)

## 2019-09-20 LAB — LIPID PANEL
Cholesterol: 194 mg/dL (ref <200)
HDL: 48 mg/dL — ABNORMAL LOW (ref >=50)
LDL Cholesterol (Calc): 94 mg/dL (calc)
Non-HDL Cholesterol (Calc): 146 mg/dL (calc) — ABNORMAL HIGH (ref <130)
Total CHOL/HDL Ratio: 4 (calc) (ref <5.0)
Triglycerides: 391 mg/dL — ABNORMAL HIGH (ref <150)

## 2019-09-20 LAB — MICROALBUMIN / CREATININE URINE RATIO
Creatinine, Urine: 72 mg/dL (ref 20–275)
Microalb Creat Ratio: 25 mcg/mg creat (ref <30)
Microalb, Ur: 1.8 mg/dL

## 2019-09-20 LAB — COMPREHENSIVE METABOLIC PANEL
AG Ratio: 1.4 (calc) (ref 1.0–2.5)
ALT: 13 U/L (ref 6–29)
AST: 10 U/L (ref 10–35)
Albumin: 3.8 g/dL (ref 3.6–5.1)
Alkaline phosphatase (APISO): 90 U/L (ref 31–125)
BUN/Creatinine Ratio: 17 (calc) (ref 6–22)
BUN: 20 mg/dL (ref 7–25)
CO2: 22 mmol/L (ref 20–32)
Calcium: 9.8 mg/dL (ref 8.6–10.2)
Chloride: 100 mmol/L (ref 98–110)
Creat: 1.16 mg/dL — ABNORMAL HIGH (ref 0.50–1.10)
Globulin: 2.7 g/dL (calc) (ref 1.9–3.7)
Glucose, Bld: 331 mg/dL — ABNORMAL HIGH (ref 65–99)
Potassium: 4 mmol/L (ref 3.5–5.3)
Sodium: 135 mmol/L (ref 135–146)
Total Bilirubin: 0.2 mg/dL (ref 0.2–1.2)
Total Protein: 6.5 g/dL (ref 6.1–8.1)

## 2019-09-20 LAB — VITAMIN B12: Vitamin B-12: 387 pg/mL (ref 200–1100)

## 2019-09-20 LAB — VITAMIN D 25 HYDROXY (VIT D DEFICIENCY, FRACTURES): Vit D, 25-Hydroxy: 28 ng/mL — ABNORMAL LOW (ref 30–100)

## 2019-09-20 LAB — TSH: TSH: 2.84 mIU/L

## 2019-09-21 ENCOUNTER — Encounter: Payer: Self-pay | Admitting: Internal Medicine

## 2019-09-21 DIAGNOSIS — Z01818 Encounter for other preprocedural examination: Secondary | ICD-10-CM | POA: Insufficient documentation

## 2019-09-21 NOTE — Assessment & Plan Note (Signed)
Patient  is considered to be at moderate to high risk  For perioperative complications  Based on today's exam and history.  Baseline lytes,  hgb and ekg done..  Sugars need to be < 200 to to impact proper hearing

## 2019-09-21 NOTE — Assessment & Plan Note (Signed)
Uncontrolled due to patient being lost to follow up and suspensionof medications.  will resume basal insulin at 20 units daily (dose prior to quitting ws 50 units)  and adjust weekly

## 2019-09-22 ENCOUNTER — Telehealth: Payer: Self-pay | Admitting: Cardiovascular Disease

## 2019-09-22 NOTE — Progress Notes (Signed)
Cardiac Clearance has been faxed to Dr Rogue Jury Arida's office. The patient has an appointment there today.   [place]: [phone] Fax: [phone]

## 2019-09-22 NOTE — Telephone Encounter (Signed)
° °  [facility] Pre-operative Risk Assessment    Request for surgical clearance:  1. What type of surgery is being performed? EUA, Poss I & D, poss fistulotomy   2. When is this surgery scheduled? TBD   3. What type of clearance is required (medical clearance vs. Pharmacy clearance to hold med vs. Both)? Medical - tachycardia , hypertension   4. Are there any medications that need to be held prior to surgery and how long? None listed    5. Practice name and name of physician performing surgery? [facility]    6. What is your office phone number [phone]    7.   What is your office fax number [phone]  8.   Anesthesia type (None, local, MAC, general) ? General    Caryl Pina Gerringer 09/22/2019, 4:18 PM  _________________________________________________________________   (provider comments below)

## 2019-09-23 NOTE — Telephone Encounter (Signed)
Surgery is scheduled for 11/18, echo scheduled for 11/12, will keep current echo schedule

## 2019-09-23 NOTE — Telephone Encounter (Signed)
History of inappropriate sinus tachycardia and hypertension. Patient was last seen by Dr. Fletcher Anon who noted her HR has improved on the diltiazem. Echo was ordered to rule out tachy-mediated cardiomyopathy. Ideally need echocardiogram prior to clearance. Echo currently scheduled for Nov, will need to move to a sooner date.

## 2019-09-24 ENCOUNTER — Telehealth: Payer: Self-pay | Admitting: General Surgery

## 2019-09-24 ENCOUNTER — Telehealth: Payer: Self-pay

## 2019-09-24 NOTE — Telephone Encounter (Signed)
I have called patient to go over information below. Patient was unable to talk at that time. She will call the office back to obtain information.   Surgery Date: 10/29/19 with Dr Tyrone Nine, possible fistulotomy , possible seton placement, possible I&D of abscess, possible fibrin glue Preadmission Testing Date: 10/22/19 @ 8:00am-office interview-pt will need to go the [facility] and pt will be escorted to the preadmission office.  Covid Testing Date: 10/24/19 between 8-10:30am - patient advised to go to the [place] [facility]  [employer] Video sent via [employer] and [employer].  Please make patient aware to call [phone], between 1-3:00pm the day before surgery, to find out what time to arrive.

## 2019-09-24 NOTE — Telephone Encounter (Signed)
Called the requesting office of [facility] and spoke with Hoyle Sauer. I informed her that the patient is scheduled for an echocardiogram on 11/12 with her procedure scheduled for 11/18 and that cardiac clearance will be given pending results echo. Hoyle Sauer stated that she saw it in the patients chart and thanked me for calling to give the information as well.

## 2019-09-24 NOTE — Telephone Encounter (Signed)
Copied from [place] [id number]. Topic: General - Other >> Sep 24, 2019 11:06 AM Celene Kras A wrote: Reason for CRM: [facility], called stating they are not able to see pt without medical clearance and the pts diabetes under control. Freda Munro is asking PCP to get in contact with pt for follow up appts. Please advise.

## 2019-09-24 NOTE — Telephone Encounter (Signed)
Medical Clearance denied - received recommends aggressive glycemic control with weekly follow up appointments. High risk. from [place] at this time- A1C 14.7 uncontrolled type 2 diabetes  Spoke with [place] office- and she will call the patient for follow up on her Diabetes so we can obtain Medical Clearance.

## 2019-09-25 NOTE — Telephone Encounter (Signed)
PLEASE CALL PATIENT TO SET UP A ONE WEEK FOLLOW UP. THIS MUST BE DONE WEEKLY TO GET BLOOD SUGARS UNDER CONTROL OR I WILL NOT BE ABLE TO CLEAR HER FOR SURGERY  .  THEY CAN BE TELEPHONE OR VIDEO

## 2019-09-25 NOTE — Telephone Encounter (Signed)
Spoke with Hoyle Sauer at [facility] and she stated that they received the clearance form but they were wondering if the pt would be following up with you sooner than December when her next appt is scheduled to follow up on her uncontrolled diabetes. The clearance form states that she will be following up weekly but there is not an appt for a 1 week follow up.

## 2019-09-25 NOTE — Telephone Encounter (Signed)
LMTCB

## 2019-09-26 ENCOUNTER — Other Ambulatory Visit: Payer: Self-pay | Admitting: Internal Medicine

## 2019-09-29 NOTE — Telephone Encounter (Signed)
All information has been discussed with the patient. Patient's preadmission date did change. Pre admit appointment-office interview is 10/24/19 @7 :30am with the covid testing following the appointment. Patient was advised to arrive at the [facility].

## 2019-10-02 ENCOUNTER — Telehealth: Payer: BC Managed Care – PPO | Admitting: Physician Assistant

## 2019-10-02 DIAGNOSIS — R05 Cough: Secondary | ICD-10-CM

## 2019-10-02 DIAGNOSIS — R509 Fever, unspecified: Secondary | ICD-10-CM

## 2019-10-02 DIAGNOSIS — R059 Cough, unspecified: Secondary | ICD-10-CM

## 2019-10-02 MED ORDER — ALBUTEROL SULFATE HFA 108 (90 BASE) MCG/ACT IN AERS: 2.0000 | INHALATION_SPRAY | Freq: Four times a day (QID) | RESPIRATORY_TRACT | 0 refills | Status: DC | PRN

## 2019-10-02 MED ORDER — BENZONATATE 100 MG PO CAPS: 100.0000 mg | ORAL_CAPSULE | Freq: Three times a day (TID) | ORAL | 0 refills | Status: DC | PRN

## 2019-10-02 NOTE — Progress Notes (Signed)
E-Visit for Corona Virus Screening   Your current symptoms could be consistent with the coronavirus.  Many health care providers can now test patients at their office but not all are.  [facility] has multiple testing sites. For information on our COVID testing locations and hours go to [email or web address]  Please quarantine yourself while awaiting your test results.  We are enrolling you in our [place] for [place] . Daily you will receive a questionnaire within the [place] website. Our COVID 19 response team willl be monitoriing your responses daily.  An order for a test has been placed.  COVID-19 is a respiratory illness with symptoms that are similar to the flu. Symptoms are typically mild to moderate, but there have been cases of severe illness and death due to the virus. The following symptoms may appear 2-14 days after exposure: . Fever . Cough . Shortness of breath or difficulty breathing . Chills . Repeated shaking with chills . Muscle pain . Headache . Sore throat . New loss of taste or smell . Fatigue . Congestion or runny nose . Nausea or vomiting . Diarrhea  It is vitally important that if you feel that you have an infection such as this virus or any other virus that you stay home and away from places where you may spread it to others.  You should self-quarantine for 14 days if you have symptoms that could potentially be coronavirus or have been in close contact a with a person diagnosed with COVID-19 within the last 2 weeks. You should avoid contact with people age 30 and older.   You should wear a mask or cloth face covering over your nose and mouth if you must be around other people or animals, including pets (even at home). Try to stay at least 6 feet away from other people. This will protect the people around you.  You can use medication such as A prescription cough medication called Tessalon Perles 100 mg. You may take 1-2  capsules every 8 hours as needed for cough and A prescription inhaler called Albuterol MDI 90 mcg /actuation 2 puffs every 4 hours as needed for shortness of breath, wheezing, cough  You may also take acetaminophen (Tylenol) as needed for fever.   Reduce your risk of any infection by using the same precautions used for avoiding the common cold or flu:  Marland Kitchen Wash your hands often with soap and warm water for at least 20 seconds.  If soap and water are not readily available, use an alcohol-based hand sanitizer with at least 60% alcohol.  . If coughing or sneezing, cover your mouth and nose by coughing or sneezing into the elbow areas of your shirt or coat, into a tissue or into your sleeve (not your hands). . Avoid shaking hands with others and consider head nods or verbal greetings only. . Avoid touching your eyes, nose, or mouth with unwashed hands.  . Avoid close contact with people who are sick. . Avoid places or events with large numbers of people in one location, like concerts or sporting events. . Carefully consider travel plans you have or are making. . If you are planning any travel outside or inside the [place], visit the CDC's Travelers' Health webpage for the latest health notices. . If you have some symptoms but not all symptoms, continue to monitor at home and seek medical attention if your symptoms worsen. . If you are having a medical emergency, call 911.  HOME CARE . Only take medications  as instructed by your medical team. . Drink plenty of fluids and get plenty of rest. . A steam or ultrasonic humidifier can help if you have congestion.   GET HELP RIGHT AWAY IF YOU HAVE EMERGENCY WARNING SIGNS** FOR COVID-19. If you or someone is showing any of these signs seek emergency medical care immediately. Call 911 or proceed to your closest emergency facility if: . You develop worsening high fever. . Trouble breathing . Bluish lips or face . Persistent pain or pressure in the chest . New  confusion . Inability to wake or stay awake . You cough up blood. . Your symptoms become more severe  **This list is not all possible symptoms. Contact your medical provider for any symptoms that are sever or concerning to you.   MAKE SURE YOU   Understand these instructions.  Will watch your condition.  Will get help right away if you are not doing well or get worse.  Your e-visit answers were reviewed by a board certified advanced clinical practitioner to complete your personal care plan.  Depending on the condition, your plan could have included both over the counter or prescription medications.  If there is a problem please reply once you have received a response from your provider.  Your safety is important to [place].  If you have drug allergies check your prescription carefully.    You can use MyChart to ask questions about today's visit, request a non-urgent call back, or ask for a work or school excuse for 24 hours related to this e-Visit. If it has been greater than 24 hours you will need to follow up with your provider, or enter a new e-Visit to address those concerns. You will get an e-mail in the next two days asking about your experience.  I hope that your e-visit has been valuable and will speed your recovery. Thank you for using e-visits.   Particia Nearing PA-C  Approximately 5 minutes was spent documenting and reviewing patient's chart.

## 2019-10-03 ENCOUNTER — Encounter (INDEPENDENT_AMBULATORY_CARE_PROVIDER_SITE_OTHER): Payer: Self-pay

## 2019-10-03 ENCOUNTER — Other Ambulatory Visit: Payer: Self-pay

## 2019-10-03 DIAGNOSIS — Z20822 Contact with and (suspected) exposure to covid-19: Secondary | ICD-10-CM

## 2019-10-04 ENCOUNTER — Encounter (INDEPENDENT_AMBULATORY_CARE_PROVIDER_SITE_OTHER): Payer: Self-pay

## 2019-10-04 ENCOUNTER — Telehealth: Payer: Self-pay

## 2019-10-04 NOTE — Telephone Encounter (Signed)
Pt indicated worsening SOB and appetite. Pt stated that it is difficult to get a deep breath in. Pt stated her cough is worse. Pt stated that she is occasionally wheezing but is getting relief after using inhaler. Denies CP or SOB. Pt stated she is drinking fluids well just has decreased appetite. No nausea or vomiting.  Advised pt to call 911 if having SOB, resp distress or difficulty. Advised to go to [facility] if wheezing is not relieved with inhaler. Pt encouraged to seek medical care using 911 if having severe vomiting and or abd pain.

## 2019-10-05 ENCOUNTER — Other Ambulatory Visit: Payer: Self-pay

## 2019-10-05 ENCOUNTER — Emergency Department: Payer: BC Managed Care – PPO

## 2019-10-05 ENCOUNTER — Emergency Department
Admission: EM | Admit: 2019-10-05 | Discharge: 2019-10-05 | Disposition: A | Discharge status: Home / Self Care | Payer: BC Managed Care – PPO | Attending: Emergency Medicine | Admitting: Emergency Medicine

## 2019-10-05 DIAGNOSIS — I10 Essential (primary) hypertension: Secondary | ICD-10-CM | POA: Insufficient documentation

## 2019-10-05 DIAGNOSIS — Z79899 Other long term (current) drug therapy: Secondary | ICD-10-CM | POA: Diagnosis not present

## 2019-10-05 DIAGNOSIS — U071 COVID-19: Secondary | ICD-10-CM | POA: Insufficient documentation

## 2019-10-05 DIAGNOSIS — R0602 Shortness of breath: Secondary | ICD-10-CM | POA: Diagnosis present

## 2019-10-05 DIAGNOSIS — Z794 Long term (current) use of insulin: Secondary | ICD-10-CM | POA: Diagnosis not present

## 2019-10-05 DIAGNOSIS — R509 Fever, unspecified: Secondary | ICD-10-CM | POA: Diagnosis not present

## 2019-10-05 DIAGNOSIS — R0789 Other chest pain: Secondary | ICD-10-CM | POA: Insufficient documentation

## 2019-10-05 DIAGNOSIS — R42 Dizziness and giddiness: Secondary | ICD-10-CM | POA: Insufficient documentation

## 2019-10-05 DIAGNOSIS — E1165 Type 2 diabetes mellitus with hyperglycemia: Secondary | ICD-10-CM | POA: Diagnosis not present

## 2019-10-05 DIAGNOSIS — R059 Cough, unspecified: Secondary | ICD-10-CM

## 2019-10-05 DIAGNOSIS — R531 Weakness: Secondary | ICD-10-CM | POA: Diagnosis not present

## 2019-10-05 DIAGNOSIS — R05 Cough: Secondary | ICD-10-CM

## 2019-10-05 DIAGNOSIS — Z87891 Personal history of nicotine dependence: Secondary | ICD-10-CM | POA: Insufficient documentation

## 2019-10-05 DIAGNOSIS — R519 Headache, unspecified: Secondary | ICD-10-CM | POA: Diagnosis not present

## 2019-10-05 LAB — COMPREHENSIVE METABOLIC PANEL
ALT: 24 U/L (ref 0–44)
AST: 22 U/L (ref 15–41)
Albumin: 2.8 g/dL — ABNORMAL LOW (ref 3.5–5.0)
Alkaline Phosphatase: 73 U/L (ref 38–126)
Anion gap: 13 (ref 5–15)
BUN: 18 mg/dL (ref 6–20)
CO2: 22 mmol/L (ref 22–32)
Calcium: 8 mg/dL — ABNORMAL LOW (ref 8.9–10.3)
Chloride: 106 mmol/L (ref 98–111)
Creatinine, Ser: 1.29 mg/dL — ABNORMAL HIGH (ref 0.44–1.00)
GFR calc Af Amer: 56 mL/min — ABNORMAL LOW (ref >60)
GFR calc non Af Amer: 49 mL/min — ABNORMAL LOW (ref >60)
Glucose, Bld: 302 mg/dL — ABNORMAL HIGH (ref 70–99)
Potassium: 3.3 mmol/L — ABNORMAL LOW (ref 3.5–5.1)
Sodium: 141 mmol/L (ref 135–145)
Total Bilirubin: 0.2 mg/dL — ABNORMAL LOW (ref 0.3–1.2)
Total Protein: 6.2 g/dL — ABNORMAL LOW (ref 6.5–8.1)

## 2019-10-05 LAB — TROPONIN I (HIGH SENSITIVITY)
Troponin I (High Sensitivity): 19 ng/L — ABNORMAL HIGH (ref <18)
Troponin I (High Sensitivity): 13 ng/L (ref <18)

## 2019-10-05 LAB — CBC WITH DIFFERENTIAL/PLATELET
Abs Immature Granulocytes: 0.01 10*3/uL (ref 0.00–0.07)
Basophils Absolute: 0 10*3/uL (ref 0.0–0.1)
Basophils Relative: 0 %
Eosinophils Absolute: 0 10*3/uL (ref 0.0–0.5)
Eosinophils Relative: 1 %
HCT: 40.9 % (ref 36.0–46.0)
Hemoglobin: 12.7 g/dL (ref 12.0–15.0)
Immature Granulocytes: 0 %
Lymphocytes Relative: 34 %
Lymphs Abs: 1.6 10*3/uL (ref 0.7–4.0)
MCH: 29.5 pg (ref 26.0–34.0)
MCHC: 31.1 g/dL (ref 30.0–36.0)
MCV: 94.9 fL (ref 80.0–100.0)
Monocytes Absolute: 0.3 10*3/uL (ref 0.1–1.0)
Monocytes Relative: 6 %
Neutro Abs: 2.8 10*3/uL (ref 1.7–7.7)
Neutrophils Relative %: 59 %
Platelets: 252 10*3/uL (ref 150–400)
RBC: 4.31 MIL/uL (ref 3.87–5.11)
RDW: 13.6 % (ref 11.5–15.5)
WBC: 4.7 10*3/uL (ref 4.0–10.5)
nRBC: 0 % (ref 0.0–0.2)

## 2019-10-05 LAB — NOVEL CORONAVIRUS, NAA: SARS-CoV-2, NAA: DETECTED — AB

## 2019-10-05 MED ORDER — PREDNISONE 10 MG PO TABS: 50.0000 mg | ORAL_TABLET | Freq: Every day | ORAL | 0 refills | Status: DC

## 2019-10-05 NOTE — ED Provider Notes (Signed)
[facility] Emergency Department Provider Note  ____________________________________________   First MD Initiated Contact with Patient 10/05/19 1342     (approximate)  I have reviewed the triage vital signs and the nursing notes.   HISTORY  Chief Complaint Shortness of Breath  HPI Maria Bentley is a 48 y.o. female presents to the ED with complaint of shortness of breath after learning that her Covid test that was done Friday was positive.  Patient states that she has a pulse ox at home and told the nurse that it was 93.  It was at that time that she was told to go to the ED for evaluation.  She states her fever was 102 yesterday and that she had a headache.  Patient today has not taken any medication for fever or headache.  There is minimal coughing and patient has been using an inhaler that she has at home.  Patient is a former smoker at 1 pack/day but discontinued smoking 19 years ago.  She reports feeling weak and dizzy when she is exerting herself.  Patient also has type 2 diabetes and uses insulin.  She states that she has not been compliant with her diet or medication for "quite a while".  She has recently started on her medication and back on her diabetic diet.  She states that her blood sugars have been running high.  Her plan is to call her PCP on Monday to regulate her insulin use.  Patient also gives a history of sinus tachycardia which is being followed by her cardiologist.  She states that the shortness of breath and anterior chest wall pressure is concerning for her.  Currently she rates her pain as 9/10.      Past Medical History:  Diagnosis Date  . Anemia   . Anxiety   . Depression 2010  . Diabetes mellitus   . Endometriosis   . Headache, common migraine, intractable   . History of ovarian cyst    left sided, caused by her endometriosis (possibly an endometrioma), in her 62s.  Marland Kitchen Hyperlipidemia   . Hypertension   . Kidney stones    left  . Morbid obesity ([place])    s/p gastric bypass  . Personal hx of gastric bypass 2010  . Rosacea   . Vertigo     Patient Active Problem List   Diagnosis Date Noted  . COVID-19 virus infection 10/06/2019  . Preoperative evaluation of a medical condition to rule out surgical contraindications (TAR required) 09/21/2019  . Abscess of right buttock 04/30/2019  . Chronic pain 06/05/2018  . Iron deficiency anemia 06/05/2018  . Diarrhea of presumed infectious origin 04/21/2018  . Sepsis ([place]) 03/11/2018  . Paronychia of great toe of left foot 11/24/2017  . Hypertension 11/24/2017  . Pelvic mass in female 04/05/2017  . Tendonitis 03/30/2016  . Left ankle sprain 03/30/2016  . Peroneal tendonitis 01/13/2016  . Hip pain, chronic 12/28/2015  . Pain in joint, ankle and foot 12/28/2015  . Constipation 12/27/2015  . 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 Noncompliance with diabetes treatment 06/08/2015  . Right hip pain 05/30/2014  . Vitamin D deficiency 05/30/2014  . Tachycardia 08/15/2013  . Diabetes mellitus type 2, uncontrolled ([place]) 08/15/2013  . 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  . Dyspnea 07/15/2013  . Morbid obesity ([place]) 06/09/2012  . Anxiety   . Headache, common migraine, intractable   . Hyperlipidemia   . OSA (obstructive sleep apnea) 02/05/2012    Past Surgical  History:  Procedure Laterality Date  . DILATION AND CURETTAGE OF UTERUS  2011  . gastric bypass    . OVARIAN CYST REMOVAL    . ROTATOR CUFF REPAIR     right    Prior to Admission medications   Medication Sig Start Date End Date Taking? Authorizing Provider  acetaminophen (TYLENOL) 325 MG tablet Take by mouth.    [provider]  albuterol (VENTOLIN HFA) 108 (90 Base) MCG/ACT inhaler Inhale 2 puffs into the lungs every 6 (six) hours as needed for wheezing or shortness of breath. 10/02/19   Terald Sleeper, PA-C  atorvastatin (LIPITOR) 20 MG tablet TAKE 1 TABLET BY MOUTH EVERY DAY  05/16/19   [name], MD  B-D INS SYR MICROFINE 1CC/28G 28G X 1/2" 1 ML MISC USE 1 SYRINGE DAILY AFTER SUPPER 01/10/16   [name], MD  benzonatate (TESSALON) 100 MG capsule Take 1-2 capsules (100-200 mg total) by mouth 3 (three) times daily as needed for cough. 10/02/19   Terald Sleeper, PA-C  blood glucose meter kit and supplies KIT Dispense based on patient and insurance preference. Use up to four times daily as directed. (FOR ICD-E11.21) 08/22/16   [name], MD  buPROPion (WELLBUTRIN XL) 150 MG 24 hr tablet TAKE 3 TABLETS BY MOUTH IN THE MORNING 05/12/18   [provider]  Continuous Blood Gluc Receiver (FREESTYLE LIBRE 14 DAY READER) DEVI 1 applicator by Does not apply route 4 (four) times daily. 11/24/17   [name], MD  Continuous Blood Gluc Sensor (FREESTYLE LIBRE 14 DAY SENSOR) MISC Inject 1 application into the skin 4 (four) times daily as needed (uncontrolled diabetes). 09/19/19   [name], MD  cyanocobalamin (,VITAMIN B-12,) 1000 MCG/ML injection INJECT 1 ML (1,000 MCG TOTAL) INTO THE MUSCLE ONCE A WEEK. FOR 4 WEEKS, THEN MONTHLY THEREAFTER 02/18/19   [name], MD  cyclobenzaprine (FLEXERIL) 10 MG tablet TAKE 1/2 TO 1 UP TO THREE TIMES DAILY AS NEEDED. DROWSINESS PRECAUTIONS 05/21/18   [provider]  diltiazem (CARDIZEM CD) 180 MG 24 hr capsule TAKE 1 CAPSULE BY MOUTH EVERY DAY 08/29/19   Wellington Hampshire, MD  EPINEPHrine 0.3 mg/0.3 mL IJ SOAJ injection  06/25/10   [provider]  escitalopram (LEXAPRO) 20 MG tablet Take 20 mg by mouth daily.  02/27/17   [provider]  fluticasone (FLONASE) 50 MCG/ACT nasal spray SPRAY 2 SPRAYS INTO EACH NOSTRIL EVERY DAY 09/29/19   [name], MD  HYDROcodone-acetaminophen (NORCO) 10-325 MG tablet Take 1 tablet by mouth every 6 (six) hours as needed for up to 7 days for severe pain (headache). 10/06/19 10/13/19  [name], MD  INS SYRINGE/NEEDLE 1CC/28G (B-D INS SYR MICROFINE  1CC/28G) 28G X 1/2" 1 ML MISC USE 1 SYRINGE DAILY AFTER SUPPER 08/01/18   [name], MD  insulin aspart (NOVOLOG FLEXPEN) 100 UNIT/ML FlexPen Inject 15 units + sliding scale under the skin with meals. Add 3-5 units for every blood sugar 50 mg/dL over 150. Max dose of 30 units with meals. 08/19/18   [name], MD  Insulin Glargine (BASAGLAR KWIKPEN) 100 UNIT/ML SOPN INJECT 50 UNITS TOTAL INTO THE SKIN DAILY. 09/15/19   [name], MD  Insulin Pen Needle (BD PEN NEEDLE NANO U/F) 32G X 4 MM MISC Please specify directions, refills and quantity 08/22/18   [name], MD  Insulin Syringe-Needle U-100 (B-D INS SYR ULTRAFINE 1CC/31G) 31G X 5/16" 1  ML MISC 1 Syringe by Does not apply route daily. 10/13/13   [name], MD  lamoTRIgine (LAMICTAL) 150 MG tablet Take 150 mg by mouth daily.    [provider]  Lancet Device MISC Test blood sugar two times daily 07/29/12   [name], MD  LORazepam (ATIVAN) 0.5 MG tablet Take 0.5-1 mg by mouth daily as needed.     [provider]  meclizine (ANTIVERT) 25 MG tablet Take 1 tablet (25 mg total) by mouth 3 (three) times daily as needed for dizziness. 06/16/15   [name], MD  metFORMIN (GLUCOPHAGE-XR) 500 MG 24 hr tablet TAKE 4 TABLETS BY MOUTH EVERY DAY WITH BREAKFAST 08/14/19   [name], MD  NURTEC 75 MG TBDP Take 1 tablet by mouth daily as needed. 09/19/19   [provider]  nystatin (MYCOSTATIN/NYSTOP) powder APPLY 1 APPLICATION TO AFFECTED AREA TWICE A DAY Patient taking differently: as needed.  03/12/19   [name], MD  OnabotulinumtoxinA (BOTOX IJ) Inject as directed every 3 (three) months. For migraine    [provider]  ONETOUCH VERIO test strip TEST BLOOD SUGAR 5 TIMES DAILY 09/03/18   [name], MD  oxyCODONE-acetaminophen (PERCOCET) 10-325 MG tablet Take 1 tablet by mouth every 6 (six) hours as needed for pain.    [provider]  predniSONE (DELTASONE) 10 MG tablet  Take 5 tablets (50 mg total) by mouth daily. 10/05/19   Triplett, Cari B, FNP  SYRINGE-NEEDLE, DISP, 3 ML (BD INTEGRA SYRINGE) 25G X 1" 3 ML MISC FOR USE WITH B12 INJECTIONS WEEKLY/MONTHLY 08/01/18   [name], MD  Vitamin D, Ergocalciferol, (DRISDOL) 1.25 MG (50000 UT) CAPS capsule TAKE 1 CAPSULE BY MOUTH ONCE A WEEK 05/07/19   [name], MD    Allergies Amoxicillin, Avocado, Clarithromycin, Honey, Other, Sulfa drugs cross reactors, Watermelon concentrate, Metoprolol, Adhesive [tape], Levemir [insulin detemir], and Victoza [liraglutide]  Family History  Problem Relation Age of Onset  . Diabetes Mother   . Hypertension Mother   . Hypertension Father   . Diabetes Father   . Depression Other        strong hx of  . Breast cancer Paternal Grandmother 70  . Cancer Neg Hx     Social History Social History   Tobacco Use  . Smoking status: Former Smoker    Packs/day: 1.00    Years: 4.00    Pack years: 4.00    Types: Cigarettes    Quit date: 02/05/1996    Years since quitting: 23.6  . Smokeless tobacco: Never Used  Substance Use Topics  . Alcohol use: No  . Drug use: No    Review of Systems Constitutional: Positive fever/chills.  Positive for dizziness. Eyes: No visual changes. ENT: No sore throat. Cardiovascular: Denies chest pain.  History of sinus tachycardia. Respiratory: Positive shortness of breath.  Gastrointestinal: No abdominal pain.  No nausea, no vomiting.  No diarrhea. Musculoskeletal: Positive for muscle aches. Skin: Negative for rash. Neurological: Negative for headaches, focal weakness or numbness. Endocrine:  Type 2 diabetes.  ___________________________________________   PHYSICAL EXAM:  VITAL SIGNS: ED Triage Vitals [10/05/19 1309]  Enc Vitals Group     BP 122/73     Pulse Rate (!) 107     Resp 14     Temp 98.4 F (36.9 C)     Temp Source Oral     SpO2 94 %     Weight 220 lb (99.8 kg)  Height      Head Circumference      Peak Flow       Pain Score 9     Pain Loc      Pain Edu?      Excl. in [place]?    Constitutional: Alert and oriented. Well appearing and in no acute distress.  Patient is able to speak in complete sentences without any shortness of breath. Eyes: Conjunctivae are normal.  Head: Atraumatic. Nose: No congestion/rhinnorhea. Neck: No stridor.   Cardiovascular: Normal rate, regular rhythm. Grossly normal heart sounds.  Good peripheral circulation. Respiratory: Normal respiratory effort.  No retractions. Lungs CTAB. Neurologic:  Normal speech and language. No gross focal neurologic deficits are appreciated. No gait instability. Skin:  Skin is warm, dry and intact. No rash noted. Psychiatric: Mood and affect are normal. Speech and behavior are normal.  ____________________________________________   LABS (all labs ordered are listed, but only abnormal results are displayed)  Labs Reviewed  COMPREHENSIVE METABOLIC PANEL - Abnormal; Notable for the following components:      Result Value   Potassium 3.3 (*)    Glucose, Bld 302 (*)    Creatinine, Ser 1.29 (*)    Calcium 8.0 (*)    Total Protein 6.2 (*)    Albumin 2.8 (*)    Total Bilirubin 0.2 (*)    GFR calc non Af Amer 49 (*)    GFR calc Af Amer 56 (*)    All other components within normal limits  TROPONIN I (HIGH SENSITIVITY) - Abnormal; Notable for the following components:   Troponin I (High Sensitivity) 19 (*)    All other components within normal limits  CBC WITH DIFFERENTIAL/PLATELET  TROPONIN I (HIGH SENSITIVITY)   ____________________________________________  EKG  EKG was reviewed by Dr. Jacqualine Code.  Sinus tachycardia with a ventricular rate of 102.  PR interval 128, QRS duration 96.  ____________________________________________  RADIOLOGY   Official radiology report(s): No results found. ____________________________________________   PROCEDURES  Procedure(s) performed (including Critical Care):  Procedures    ____________________________________________   INITIAL IMPRESSION / ASSESSMENT AND PLAN / ED COURSE  As part of my medical decision making, I reviewed the following data within the electronic MEDICAL RECORD NUMBER Notes from prior ED visits and [place] Controlled Substance Database  Karlye Ihrig was evaluated in Emergency Department on 10/06/2019 for the symptoms described in the history of present illness. She was evaluated in the context of the global COVID-19 pandemic, which necessitated consideration that the patient might be at risk for infection with the SARS-CoV-2 virus that causes COVID-19. Institutional protocols and algorithms that pertain to the evaluation of patients at risk for COVID-19 are in a state of rapid change based on information released by regulatory bodies including the CDC and federal and state organizations. These policies and algorithms were followed during the patient's care in the ED.  48 year old female presents to the ED with concerns about her breathing after finding out that she was Covid positive this morning.  Patient complains of shortness of breath and that her O2 sat was 93% at home.  Patient was able to converse in getting her history without any shortness of breath or pauses to catch her breath.  Patient O2 sat was monitored while patient was exerting herself walking back and forth in the room and never fell but low 93%.  While discussing plan of treatment patient gave her history of being noncompliant with her diabetes and her poor control at this time.  She also brought up that she felt her chest was tight and that she is currently seeing a cardiologist for her sinus tachycardia.  Lab work was reviewed prior to discharge and it was decided that a second troponin would be ordered in 2 hours.  Patient was stable at the time of this provider evaluation.  The remainder of her care was transferred to Randon Goldsmith, [place] at 1633   ____________________________________________   FINAL CLINICAL IMPRESSION(S) / ED DIAGNOSES  Final diagnoses:  COVID-19  Poorly controlled diabetes mellitus [facility])     ED Discharge Orders         Ordered    predniSONE (DELTASONE) 10 MG tablet  Daily     10/05/19 1848           Note:  This document was prepared using Dragon voice recognition software and may include unintentional dictation errors.    Johnn Hai, PA-C 10/06/19 1423    Vanessa Saltillo, MD 10/06/19 [phone]

## 2019-10-05 NOTE — ED Notes (Signed)
Pt sat 93% continuously while ambulating with pulse ox.

## 2019-10-05 NOTE — ED Provider Notes (Signed)
  Physical Exam  BP 118/77   Pulse 89   Temp 98.4 F (36.9 C) (Oral)   Resp 17   Wt 99.8 kg   LMP 05/09/2016 (LMP Unknown) Comment: D/C  SpO2 96%   BMI 37.76 kg/m   Physical Exam  ED Course/Procedures   Clinical Course as of Oct 04 1956  Sun Oct 05, 2019  1836 Troponin I (High Sensitivity)(!): 19 [CT]    Clinical Course User Index [CT] Victorino Dike, FNP    Procedures  MDM   48 year old female presenting to the emergency department.  Please see initial provider note from Letitia Neri, PA-C.  Apparently, the patient was advised today that she is Covid positive. She noticed that her home O2 sensor was reading 93% and she became concerned. During her ER visit she also complained of some chest wall heaviness, therefore labs including troponin were ordered. Initial troponin was 19, which is not a specific value for cardiac findings. A second troponin was done a couple of hours later and found to be 13. She will be discharged home with prescription for prednisone. She was advised to maintain a strict diabetic diet and take her medications as prescribed. She is to follow up with her PCP or return to the ER for symptoms of concern.     Victorino Dike, FNP 10/05/19 Joanie Coddington, MD 10/05/19 2042

## 2019-10-05 NOTE — ED Triage Notes (Signed)
FIRST NURSE NOTE:   Pt reports she received phone call today that she is COVID positive, pt states she is having shortness of breath with sats at home 93% pt states she had a fever yesterday 102.  Pt c/o bad headache as well.  Pt speaking in complete sentences without running out of breath.

## 2019-10-05 NOTE — Discharge Instructions (Signed)
You will need to quarantine yourself for the next 2 weeks.  Follow-up with your primary care provider for worsening symptoms.  Your blood sugar is not well controlled.  You need to stay on a strict diabetic diet and take your medications as prescribed.  The prednisone may actually cause an increase in your glucose levels, but because you are Covid positive and are feeling short of breath, you will receive a 5-day course to prevent worsening symptoms.   Continue using your inhaler when you feel short of breath.

## 2019-10-05 NOTE — ED Triage Notes (Signed)
Pt presents via POV c/o SOB and fevers. Reports COVID+ today. Reports dizziness with ambulation.

## 2019-10-06 ENCOUNTER — Encounter: Payer: Self-pay | Admitting: Internal Medicine

## 2019-10-06 ENCOUNTER — Other Ambulatory Visit: Payer: Self-pay

## 2019-10-06 ENCOUNTER — Telehealth: Payer: Self-pay

## 2019-10-06 ENCOUNTER — Ambulatory Visit (INDEPENDENT_AMBULATORY_CARE_PROVIDER_SITE_OTHER): Payer: BC Managed Care – PPO | Admitting: Internal Medicine

## 2019-10-06 DIAGNOSIS — E11649 Type 2 diabetes mellitus with hypoglycemia without coma: Secondary | ICD-10-CM | POA: Diagnosis not present

## 2019-10-06 DIAGNOSIS — G43019 Migraine without aura, intractable, without status migrainosus: Secondary | ICD-10-CM | POA: Diagnosis not present

## 2019-10-06 DIAGNOSIS — U071 COVID-19: Secondary | ICD-10-CM | POA: Insufficient documentation

## 2019-10-06 MED ORDER — HYDROCODONE-ACETAMINOPHEN 10-325 MG PO TABS: 1.0000 | ORAL_TABLET | Freq: Four times a day (QID) | ORAL | 0 refills | Status: DC | PRN

## 2019-10-06 NOTE — Telephone Encounter (Signed)
Left message for patient to call office regarding follow up appointments with Dr.Tullo to get her diabetes under control. Dr.Tullo is requesting weekly visits. Not sure if patient has called to schedule the appointments.

## 2019-10-06 NOTE — Assessment & Plan Note (Signed)
Aggravated by COVIDN 19 INFECTION..    vicodin x'd for 7 days

## 2019-10-06 NOTE — Assessment & Plan Note (Signed)
Date of infection /transmission unclear. POSITIVE TEST OCT 23 .   Last in school contact was Oct 12,  But she has met with attorneys in [place] and eaten out rarely in the last 2 weeks.  Symptoms:  Feels off balance when walking,  Dyspneic with mild exertion,  Extreme fatigue, sore throat and headache.  Mild desaturations to 93% noted at home  , prompting ER evaluation on Oct 25.    Will notify Dr Celine Ahr

## 2019-10-06 NOTE — Telephone Encounter (Signed)
Pt is scheduled for a virtual visit with Dr. [name] at 11:30 this morning.

## 2019-10-06 NOTE — Assessment & Plan Note (Signed)
Recent gains in control since early October visit using Basaglar and Novolog now lost due to active COVID 19 infection and need for prednisone use.  Advised to increase basaglar to 30 units today and increase basal insulin y 5 units every 3 days until fasting sugars are  200 Advised to increase novolog mealtime base dose from 15 to 25 units (given reported cbg of 300 pre insulin) and add 5 units for every incremental increase of 50 pts

## 2019-10-06 NOTE — Telephone Encounter (Signed)
LMTCB. Need to see how pt is doing and see if she would be willing to schedule a virtual visit with Dr. [name] this morning.

## 2019-10-06 NOTE — Patient Instructions (Signed)
Increase your Basaglar to 30 units today  Once you start the prednisone  Increase the novolog base to 25 units and continue the same sliding scale (add 5 units per 50 pts above 200)    Increase the basaglar every 3 days by 5 units if your sugars are still > 200

## 2019-10-06 NOTE — Telephone Encounter (Signed)
Pt is scheduled for a virtual visit with Dr. [name] at 11:30am.

## 2019-10-06 NOTE — Progress Notes (Signed)
Virtual Visit via [email or web address]  This visit type was conducted due to national recommendations for restrictions regarding the COVID-19 pandemic (e.g. social distancing).  This format is felt to be most appropriate for this patient at this time.  All issues noted in this document were discussed and addressed.  No physical exam was performed (except for noted visual exam findings with Video Visits).   I connected with@ on 10/06/19 at 11:30 AM EDT by a video enabled telemedicine application  and verified that I am speaking with the correct person using two identifiers. Location patient: home Location provider: work or home office Persons participating in the virtual visit: patient, provider  I discussed the limitations, risks, security and privacy concerns of performing an evaluation and management service by telephone and the availability of in person appointments. I also discussed with the patient that there may be a patient responsible charge related to this service. The patient expressed understanding and agreed to proceed.  Reason for visit: COVID 35 INFECTION   HPI:  48 yr old female with poorly controlled type 2 DM,  Obesity,  Recurrent rectal abscesses  Awaiting rectal exam under anesthesia scheduled by general surgeon on Nov 18 , tested positive for COVID ON OCT 23 after presenting for E visit with fevers .  TREATED AND RELEASED BY ED ON OCT 25 after home pulse ox readings had dipped to 93% and she felt chest pressure   ROS: See pertinent positives and negatives per HPI.  Past Medical History:  Diagnosis Date  . Anemia   . Anxiety   . Depression 2010  . Diabetes mellitus   . Endometriosis   . Headache, common migraine, intractable   . History of ovarian cyst    left sided, caused by her endometriosis (possibly an endometrioma), in her 40s.  Marland Kitchen Hyperlipidemia   . Hypertension   . Kidney stones    left  . Morbid obesity ([place])    s/p gastric bypass  . Personal hx of gastric bypass  2010  . Rosacea   . Vertigo     Past Surgical History:  Procedure Laterality Date  . DILATION AND CURETTAGE OF UTERUS  2011  . gastric bypass    . OVARIAN CYST REMOVAL    . ROTATOR CUFF REPAIR     right    Family History  Problem Relation Age of Onset  . Diabetes Mother   . Hypertension Mother   . Hypertension Father   . Diabetes Father   . Depression Other        strong hx of  . Breast cancer Paternal Grandmother 60  . Cancer Neg Hx     SOCIAL HX:  reports that she quit smoking about 23 years ago. Her smoking use included cigarettes. She has a 4.00 pack-year smoking history. She has never used smokeless tobacco. She reports that she does not drink alcohol or use drugs.   Current Outpatient Medications:  .  acetaminophen (TYLENOL) 325 MG tablet, Take by mouth., Disp: , Rfl:  .  albuterol (VENTOLIN HFA) 108 (90 Base) MCG/ACT inhaler, Inhale 2 puffs into the lungs every 6 (six) hours as needed for wheezing or shortness of breath., Disp: 18 g, Rfl: 0 .  atorvastatin (LIPITOR) 20 MG tablet, TAKE 1 TABLET BY MOUTH EVERY DAY, Disp: 90 tablet, Rfl: 0 .  B-D INS SYR MICROFINE 1CC/28G 28G X 1/2" 1 ML MISC, USE 1 SYRINGE DAILY AFTER SUPPER, Disp: 100 each, Rfl: 1 .  benzonatate (TESSALON) 100 MG capsule,  Take 1-2 capsules (100-200 mg total) by mouth 3 (three) times daily as needed for cough., Disp: 40 capsule, Rfl: 0 .  blood glucose meter kit and supplies KIT, Dispense based on patient and insurance preference. Use up to four times daily as directed. (FOR ICD-E11.21), Disp: 1 each, Rfl: 0 .  buPROPion (WELLBUTRIN XL) 150 MG 24 hr tablet, TAKE 3 TABLETS BY MOUTH IN THE MORNING, Disp: , Rfl: 3 .  Continuous Blood Gluc Receiver (FREESTYLE LIBRE 14 DAY READER) DEVI, 1 applicator by Does not apply route 4 (four) times daily., Disp: 1 Device, Rfl: 1 .  Continuous Blood Gluc Sensor (FREESTYLE LIBRE 14 DAY SENSOR) MISC, Inject 1 application into the skin 4 (four) times daily as needed  (uncontrolled diabetes)., Disp: 2 each, Rfl: 11 .  cyanocobalamin (,VITAMIN B-12,) 1000 MCG/ML injection, INJECT 1 ML (1,000 MCG TOTAL) INTO THE MUSCLE ONCE A WEEK. FOR 4 WEEKS, THEN MONTHLY THEREAFTER, Disp: 6 mL, Rfl: 4 .  cyclobenzaprine (FLEXERIL) 10 MG tablet, TAKE 1/2 TO 1 UP TO THREE TIMES DAILY AS NEEDED. DROWSINESS PRECAUTIONS, Disp: , Rfl: 12 .  diltiazem (CARDIZEM CD) 180 MG 24 hr capsule, TAKE 1 CAPSULE BY MOUTH EVERY DAY, Disp: 90 capsule, Rfl: 2 .  EPINEPHrine 0.3 mg/0.3 mL IJ SOAJ injection, , Disp: , Rfl:  .  escitalopram (LEXAPRO) 20 MG tablet, Take 20 mg by mouth daily. , Disp: , Rfl:  .  fluticasone (FLONASE) 50 MCG/ACT nasal spray, SPRAY 2 SPRAYS INTO EACH NOSTRIL EVERY DAY, Disp: 16 mL, Rfl: 1 .  INS SYRINGE/NEEDLE 1CC/28G (B-D INS SYR MICROFINE 1CC/28G) 28G X 1/2" 1 ML MISC, USE 1 SYRINGE DAILY AFTER SUPPER, Disp: 100 each, Rfl: 3 .  insulin aspart (NOVOLOG FLEXPEN) 100 UNIT/ML FlexPen, Inject 15 units + sliding scale under the skin with meals. Add 3-5 units for every blood sugar 50 mg/dL over 150. Max dose of 30 units with meals., Disp: 30 mL, Rfl: 2 .  Insulin Glargine (BASAGLAR KWIKPEN) 100 UNIT/ML SOPN, INJECT 50 UNITS TOTAL INTO THE SKIN DAILY., Disp: 45 pen, Rfl: 0 .  Insulin Pen Needle (BD PEN NEEDLE NANO U/F) 32G X 4 MM MISC, Please specify directions, refills and quantity, Disp: 100 each, Rfl: 11 .  Insulin Syringe-Needle U-100 (B-D INS SYR ULTRAFINE 1CC/31G) 31G X 5/16" 1 ML MISC, 1 Syringe by Does not apply route daily., Disp: 100 each, Rfl: 0 .  lamoTRIgine (LAMICTAL) 150 MG tablet, Take 150 mg by mouth daily., Disp: , Rfl:  .  Lancet Device MISC, Test blood sugar two times daily, Disp: 100 each, Rfl: 6 .  LORazepam (ATIVAN) 0.5 MG tablet, Take 0.5-1 mg by mouth daily as needed. , Disp: , Rfl:  .  meclizine (ANTIVERT) 25 MG tablet, Take 1 tablet (25 mg total) by mouth 3 (three) times daily as needed for dizziness., Disp: 90 tablet, Rfl: 6 .  metFORMIN (GLUCOPHAGE-XR)  500 MG 24 hr tablet, TAKE 4 TABLETS BY MOUTH EVERY DAY WITH BREAKFAST, Disp: 360 tablet, Rfl: 0 .  NURTEC 75 MG TBDP, Take 1 tablet by mouth daily as needed., Disp: , Rfl:  .  nystatin (MYCOSTATIN/NYSTOP) powder, APPLY 1 APPLICATION TO AFFECTED AREA TWICE A DAY (Patient taking differently: as needed. ), Disp: 60 g, Rfl: 0 .  OnabotulinumtoxinA (BOTOX IJ), Inject as directed every 3 (three) months. For migraine, Disp: , Rfl:  .  ONETOUCH VERIO test strip, TEST BLOOD SUGAR 5 TIMES DAILY, Disp: 500 each, Rfl: 1 .  oxyCODONE-acetaminophen (PERCOCET) 10-325 MG  tablet, Take 1 tablet by mouth every 6 (six) hours as needed for pain., Disp: , Rfl:  .  predniSONE (DELTASONE) 10 MG tablet, Take 5 tablets (50 mg total) by mouth daily., Disp: 25 tablet, Rfl: 0 .  SYRINGE-NEEDLE, DISP, 3 ML (BD INTEGRA SYRINGE) 25G X 1" 3 ML MISC, FOR USE WITH B12 INJECTIONS WEEKLY/MONTHLY, Disp: 25 each, Rfl: 0 .  Vitamin D, Ergocalciferol, (DRISDOL) 1.25 MG (50000 UT) CAPS capsule, TAKE 1 CAPSULE BY MOUTH ONCE A WEEK, Disp: 12 capsule, Rfl: 1 .  HYDROcodone-acetaminophen (NORCO) 10-325 MG tablet, Take 1 tablet by mouth every 6 (six) hours as needed for up to 7 days for severe pain (headache)., Disp: 30 tablet, Rfl: 0  EXAM:  VITALS per patient if applicable:  GENERAL: alert, oriented, appears well and in no acute distress  HEENT: atraumatic, conjunttiva clear, no obvious abnormalities on inspection of external nose and ears  NECK: normal movements of the head and neck  LUNGS: on inspection no signs of respiratory distress, breathing rate appears normal, no obvious gross SOB, gasping or wheezing  CV: no obvious cyanosis  MS: moves all visible extremities without noticeable abnormality  PSYCH/NEURO: pleasant and cooperative, no obvious depression or anxiety, speech and thought processing grossly intact  ASSESSMENT AND PLAN:  Discussed the following assessment and plan:  Uncontrolled type 2 diabetes mellitus with  hypoglycemia without coma (HCC)  Headache, common migraine, intractable  COVID-19 virus infection  Diabetes mellitus type 2, uncontrolled ([place]) Recent gains in control since early October visit using Basaglar and Novolog now lost due to active COVID 19 infection and need for prednisone use.  Advised to increase basaglar to 30 units today and increase basal insulin y 5 units every 3 days until fasting sugars are  200 Advised to increase novolog mealtime base dose from 15 to 25 units (given reported cbg of 300 pre insulin) and add 5 units for every incremental increase of 50 pts   Headache, common migraine, intractable Aggravated by COVIDN 19 INFECTION..    vicodin x'd for 7 days   COVID-19 virus infection Date of infection /transmission unclear. POSITIVE TEST OCT 23 .   Last in school contact was Oct 12,  But she has met with attorneys in [place] and eaten out rarely in the last 2 weeks.  Symptoms:  Feels off balance when walking,  Dyspneic with mild exertion,  Extreme fatigue, sore throat and headache.  Mild desaturations to 93% noted at home  , prompting ER evaluation on Oct 25.    Will notify Dr Celine Ahr     I discussed the assessment and treatment plan with the patient. The patient was provided an opportunity to ask questions and all were answered. The patient agreed with the plan and demonstrated an understanding of the instructions.   The patient was advised to call back or seek an in-person evaluation if the symptoms worsen or if the condition fails to improve as anticipated.    I provided  25 minutes of non-face-to-face time during this encounter reviewing patient's current problems and post surgeries.  Providing counseling on the above mentioned problems , and coordination  of care .  [name], MD

## 2019-10-07 NOTE — Telephone Encounter (Signed)
Pt tested positive for Covid-19 on Friday 10/03/2019.

## 2019-10-13 ENCOUNTER — Emergency Department: Payer: BC Managed Care – PPO

## 2019-10-13 ENCOUNTER — Emergency Department
Admission: EM | Admit: 2019-10-13 | Discharge: 2019-10-13 | Disposition: A | Discharge status: Other Acute Inpatient Hospital | Payer: BC Managed Care – PPO | Attending: Emergency Medicine | Admitting: Emergency Medicine

## 2019-10-13 ENCOUNTER — Other Ambulatory Visit: Payer: Self-pay

## 2019-10-13 ENCOUNTER — Encounter: Payer: Self-pay | Admitting: Emergency Medicine

## 2019-10-13 ENCOUNTER — Inpatient Hospital Stay (HOSPITAL_COMMUNITY)
Admission: AD | Admit: 2019-10-13 | Discharge: 2019-10-25 | DRG: 208 | Disposition: A | Discharge status: Home Health | Payer: BC Managed Care – PPO | Source: Other Acute Inpatient Hospital | Attending: Internal Medicine | Admitting: Internal Medicine

## 2019-10-13 ENCOUNTER — Inpatient Hospital Stay (HOSPITAL_COMMUNITY): Payer: BC Managed Care – PPO

## 2019-10-13 DIAGNOSIS — E1165 Type 2 diabetes mellitus with hyperglycemia: Secondary | ICD-10-CM | POA: Diagnosis present

## 2019-10-13 DIAGNOSIS — E11649 Type 2 diabetes mellitus with hypoglycemia without coma: Secondary | ICD-10-CM | POA: Diagnosis not present

## 2019-10-13 DIAGNOSIS — F419 Anxiety disorder, unspecified: Secondary | ICD-10-CM | POA: Diagnosis present

## 2019-10-13 DIAGNOSIS — E785 Hyperlipidemia, unspecified: Secondary | ICD-10-CM | POA: Insufficient documentation

## 2019-10-13 DIAGNOSIS — Z882 Allergy status to sulfonamides status: Secondary | ICD-10-CM | POA: Diagnosis not present

## 2019-10-13 DIAGNOSIS — F329 Major depressive disorder, single episode, unspecified: Secondary | ICD-10-CM | POA: Diagnosis present

## 2019-10-13 DIAGNOSIS — J069 Acute upper respiratory infection, unspecified: Secondary | ICD-10-CM | POA: Diagnosis not present

## 2019-10-13 DIAGNOSIS — I959 Hypotension, unspecified: Secondary | ICD-10-CM | POA: Diagnosis not present

## 2019-10-13 DIAGNOSIS — L89520 Pressure ulcer of left ankle, unstageable: Secondary | ICD-10-CM | POA: Diagnosis present

## 2019-10-13 DIAGNOSIS — E7800 Pure hypercholesterolemia, unspecified: Secondary | ICD-10-CM

## 2019-10-13 DIAGNOSIS — Z9114 Patient's other noncompliance with medication regimen: Secondary | ICD-10-CM

## 2019-10-13 DIAGNOSIS — E870 Hyperosmolality and hypernatremia: Secondary | ICD-10-CM | POA: Diagnosis present

## 2019-10-13 DIAGNOSIS — Z7952 Long term (current) use of systemic steroids: Secondary | ICD-10-CM

## 2019-10-13 DIAGNOSIS — I248 Other forms of acute ischemic heart disease: Secondary | ICD-10-CM | POA: Diagnosis present

## 2019-10-13 DIAGNOSIS — G4733 Obstructive sleep apnea (adult) (pediatric): Secondary | ICD-10-CM | POA: Diagnosis not present

## 2019-10-13 DIAGNOSIS — Z880 Allergy status to penicillin: Secondary | ICD-10-CM | POA: Insufficient documentation

## 2019-10-13 DIAGNOSIS — Z881 Allergy status to other antibiotic agents status: Secondary | ICD-10-CM | POA: Insufficient documentation

## 2019-10-13 DIAGNOSIS — R0600 Dyspnea, unspecified: Secondary | ICD-10-CM

## 2019-10-13 DIAGNOSIS — Z8249 Family history of ischemic heart disease and other diseases of the circulatory system: Secondary | ICD-10-CM

## 2019-10-13 DIAGNOSIS — Z888 Allergy status to other drugs, medicaments and biological substances status: Secondary | ICD-10-CM

## 2019-10-13 DIAGNOSIS — R0902 Hypoxemia: Secondary | ICD-10-CM | POA: Diagnosis not present

## 2019-10-13 DIAGNOSIS — Z87442 Personal history of urinary calculi: Secondary | ICD-10-CM | POA: Diagnosis not present

## 2019-10-13 DIAGNOSIS — Z9884 Bariatric surgery status: Secondary | ICD-10-CM

## 2019-10-13 DIAGNOSIS — F05 Delirium due to known physiological condition: Secondary | ICD-10-CM | POA: Diagnosis not present

## 2019-10-13 DIAGNOSIS — E559 Vitamin D deficiency, unspecified: Secondary | ICD-10-CM | POA: Diagnosis not present

## 2019-10-13 DIAGNOSIS — G43909 Migraine, unspecified, not intractable, without status migrainosus: Secondary | ICD-10-CM | POA: Diagnosis present

## 2019-10-13 DIAGNOSIS — Z818 Family history of other mental and behavioral disorders: Secondary | ICD-10-CM

## 2019-10-13 DIAGNOSIS — Z91048 Other nonmedicinal substance allergy status: Secondary | ICD-10-CM

## 2019-10-13 DIAGNOSIS — E1110 Type 2 diabetes mellitus with ketoacidosis without coma: Secondary | ICD-10-CM | POA: Insufficient documentation

## 2019-10-13 DIAGNOSIS — Z87891 Personal history of nicotine dependence: Secondary | ICD-10-CM | POA: Diagnosis not present

## 2019-10-13 DIAGNOSIS — J1289 Other viral pneumonia: Secondary | ICD-10-CM | POA: Diagnosis not present

## 2019-10-13 DIAGNOSIS — E1111 Type 2 diabetes mellitus with ketoacidosis with coma: Secondary | ICD-10-CM | POA: Diagnosis not present

## 2019-10-13 DIAGNOSIS — I10 Essential (primary) hypertension: Secondary | ICD-10-CM | POA: Diagnosis not present

## 2019-10-13 DIAGNOSIS — Z9119 Patient's noncompliance with other medical treatment and regimen: Secondary | ICD-10-CM

## 2019-10-13 DIAGNOSIS — U071 COVID-19: Secondary | ICD-10-CM | POA: Diagnosis not present

## 2019-10-13 DIAGNOSIS — Z01818 Encounter for other preprocedural examination: Secondary | ICD-10-CM

## 2019-10-13 DIAGNOSIS — Z0189 Encounter for other specified special examinations: Secondary | ICD-10-CM

## 2019-10-13 DIAGNOSIS — Z794 Long term (current) use of insulin: Secondary | ICD-10-CM

## 2019-10-13 DIAGNOSIS — J9601 Acute respiratory failure with hypoxia: Secondary | ICD-10-CM | POA: Diagnosis not present

## 2019-10-13 DIAGNOSIS — Z6838 Body mass index (BMI) 38.0-38.9, adult: Secondary | ICD-10-CM

## 2019-10-13 DIAGNOSIS — Z23 Encounter for immunization: Secondary | ICD-10-CM

## 2019-10-13 DIAGNOSIS — Z833 Family history of diabetes mellitus: Secondary | ICD-10-CM

## 2019-10-13 DIAGNOSIS — N170 Acute kidney failure with tubular necrosis: Secondary | ICD-10-CM | POA: Diagnosis present

## 2019-10-13 DIAGNOSIS — Z91199 Patient's noncompliance with other medical treatment and regimen due to unspecified reason: Secondary | ICD-10-CM

## 2019-10-13 DIAGNOSIS — E861 Hypovolemia: Secondary | ICD-10-CM | POA: Diagnosis present

## 2019-10-13 DIAGNOSIS — IMO0002 Reserved for concepts with insufficient information to code with codable children: Secondary | ICD-10-CM | POA: Diagnosis present

## 2019-10-13 DIAGNOSIS — R4587 Impulsiveness: Secondary | ICD-10-CM | POA: Diagnosis not present

## 2019-10-13 DIAGNOSIS — Z91018 Allergy to other foods: Secondary | ICD-10-CM

## 2019-10-13 DIAGNOSIS — R4182 Altered mental status, unspecified: Secondary | ICD-10-CM | POA: Diagnosis present

## 2019-10-13 DIAGNOSIS — R410 Disorientation, unspecified: Secondary | ICD-10-CM

## 2019-10-13 DIAGNOSIS — Z803 Family history of malignant neoplasm of breast: Secondary | ICD-10-CM

## 2019-10-13 DIAGNOSIS — Z79899 Other long term (current) drug therapy: Secondary | ICD-10-CM

## 2019-10-13 DIAGNOSIS — N179 Acute kidney failure, unspecified: Secondary | ICD-10-CM

## 2019-10-13 DIAGNOSIS — L8990 Pressure ulcer of unspecified site, unspecified stage: Secondary | ICD-10-CM | POA: Diagnosis not present

## 2019-10-13 DIAGNOSIS — G9341 Metabolic encephalopathy: Secondary | ICD-10-CM | POA: Diagnosis present

## 2019-10-13 DIAGNOSIS — Z4659 Encounter for fitting and adjustment of other gastrointestinal appliance and device: Secondary | ICD-10-CM

## 2019-10-13 DIAGNOSIS — E0810 Diabetes mellitus due to underlying condition with ketoacidosis without coma: Secondary | ICD-10-CM

## 2019-10-13 DIAGNOSIS — R339 Retention of urine, unspecified: Secondary | ICD-10-CM | POA: Diagnosis present

## 2019-10-13 DIAGNOSIS — Z79891 Long term (current) use of opiate analgesic: Secondary | ICD-10-CM

## 2019-10-13 DIAGNOSIS — R7989 Other specified abnormal findings of blood chemistry: Secondary | ICD-10-CM | POA: Diagnosis not present

## 2019-10-13 DIAGNOSIS — R000 Tachycardia, unspecified: Secondary | ICD-10-CM | POA: Diagnosis present

## 2019-10-13 HISTORY — DX: Type 2 diabetes mellitus with ketoacidosis without coma: E11.10

## 2019-10-13 LAB — URINALYSIS, COMPLETE (UACMP) WITH MICROSCOPIC
Bacteria, UA: NONE SEEN
Bilirubin Urine: NEGATIVE
Glucose, UA: >=500 mg/dL — AB
Ketones, ur: 80 mg/dL — AB
Leukocytes,Ua: NEGATIVE
Nitrite: NEGATIVE
Protein, ur: 100 mg/dL — AB
Specific Gravity, Urine: 1.021 (ref 1.005–1.030)
pH: 6 (ref 5.0–8.0)

## 2019-10-13 LAB — GLUCOSE, CAPILLARY
Glucose-Capillary: 551 mg/dL (ref 70–99)
Glucose-Capillary: 451 mg/dL — ABNORMAL HIGH (ref 70–99)
Glucose-Capillary: 469 mg/dL — ABNORMAL HIGH (ref 70–99)
Glucose-Capillary: 344 mg/dL — ABNORMAL HIGH (ref 70–99)
Glucose-Capillary: 279 mg/dL — ABNORMAL HIGH (ref 70–99)
Glucose-Capillary: 158 mg/dL — ABNORMAL HIGH (ref 70–99)
Glucose-Capillary: 224 mg/dL — ABNORMAL HIGH (ref 70–99)
Glucose-Capillary: 213 mg/dL — ABNORMAL HIGH (ref 70–99)
Glucose-Capillary: 158 mg/dL — ABNORMAL HIGH (ref 70–99)
Glucose-Capillary: 223 mg/dL — ABNORMAL HIGH (ref 70–99)

## 2019-10-13 LAB — BASIC METABOLIC PANEL
Anion gap: 16 — ABNORMAL HIGH (ref 5–15)
BUN: 56 mg/dL — ABNORMAL HIGH (ref 6–20)
CO2: 20 mmol/L — ABNORMAL LOW (ref 22–32)
Calcium: 9 mg/dL (ref 8.9–10.3)
Chloride: 120 mmol/L — ABNORMAL HIGH (ref 98–111)
Creatinine, Ser: 2.38 mg/dL — ABNORMAL HIGH (ref 0.44–1.00)
GFR calc Af Amer: 27 mL/min — ABNORMAL LOW (ref >60)
GFR calc non Af Amer: 23 mL/min — ABNORMAL LOW (ref >60)
Glucose, Bld: 396 mg/dL — ABNORMAL HIGH (ref 70–99)
Potassium: 4.1 mmol/L (ref 3.5–5.1)
Sodium: 156 mmol/L — ABNORMAL HIGH (ref 135–145)

## 2019-10-13 LAB — LACTIC ACID, PLASMA: Lactic Acid, Venous: 1.7 mmol/L (ref 0.5–1.9)

## 2019-10-13 LAB — POCT I-STAT 7, (LYTES, BLD GAS, ICA,H+H)
Acid-base deficit: 6 mmol/L — ABNORMAL HIGH (ref 0.0–2.0)
Bicarbonate: 20.8 mmol/L (ref 20.0–28.0)
Calcium, Ion: 1.23 mmol/L (ref 1.15–1.40)
HCT: 39 % (ref 36.0–46.0)
Hemoglobin: 13.3 g/dL (ref 12.0–15.0)
O2 Saturation: 100 %
Patient temperature: 36.7
Potassium: 3.3 mmol/L — ABNORMAL LOW (ref 3.5–5.1)
Sodium: 159 mmol/L — ABNORMAL HIGH (ref 135–145)
TCO2: 22 mmol/L (ref 22–32)
pCO2 arterial: 41.9 mmHg (ref 32.0–48.0)
pH, Arterial: 7.302 — ABNORMAL LOW (ref 7.350–7.450)
pO2, Arterial: 390 mmHg — ABNORMAL HIGH (ref 83.0–108.0)

## 2019-10-13 LAB — COMPREHENSIVE METABOLIC PANEL
ALT: 17 U/L (ref 0–44)
AST: 14 U/L — ABNORMAL LOW (ref 15–41)
Albumin: 3.3 g/dL — ABNORMAL LOW (ref 3.5–5.0)
Alkaline Phosphatase: 115 U/L (ref 38–126)
Anion gap: 27 — ABNORMAL HIGH (ref 5–15)
BUN: 53 mg/dL — ABNORMAL HIGH (ref 6–20)
CO2: 11 mmol/L — ABNORMAL LOW (ref 22–32)
Calcium: 9.5 mg/dL (ref 8.9–10.3)
Chloride: 114 mmol/L — ABNORMAL HIGH (ref 98–111)
Creatinine, Ser: 2.44 mg/dL — ABNORMAL HIGH (ref 0.44–1.00)
GFR calc Af Amer: 26 mL/min — ABNORMAL LOW (ref >60)
GFR calc non Af Amer: 22 mL/min — ABNORMAL LOW (ref >60)
Glucose, Bld: 709 mg/dL (ref 70–99)
Potassium: 4.5 mmol/L (ref 3.5–5.1)
Sodium: 152 mmol/L — ABNORMAL HIGH (ref 135–145)
Total Bilirubin: 2.7 mg/dL — ABNORMAL HIGH (ref 0.3–1.2)
Total Protein: 8 g/dL (ref 6.5–8.1)

## 2019-10-13 LAB — CBC WITH DIFFERENTIAL/PLATELET
Abs Immature Granulocytes: 0.78 10*3/uL — ABNORMAL HIGH (ref 0.00–0.07)
Basophils Absolute: 0.1 10*3/uL (ref 0.0–0.1)
Basophils Relative: 0 %
Eosinophils Absolute: 0 10*3/uL (ref 0.0–0.5)
Eosinophils Relative: 0 %
HCT: 52.2 % — ABNORMAL HIGH (ref 36.0–46.0)
Hemoglobin: 15.3 g/dL — ABNORMAL HIGH (ref 12.0–15.0)
Immature Granulocytes: 3 %
Lymphocytes Relative: 6 %
Lymphs Abs: 1.5 10*3/uL (ref 0.7–4.0)
MCH: 29.7 pg (ref 26.0–34.0)
MCHC: 29.3 g/dL — ABNORMAL LOW (ref 30.0–36.0)
MCV: 101.4 fL — ABNORMAL HIGH (ref 80.0–100.0)
Monocytes Absolute: 1.4 10*3/uL — ABNORMAL HIGH (ref 0.1–1.0)
Monocytes Relative: 6 %
Neutro Abs: 20 10*3/uL — ABNORMAL HIGH (ref 1.7–7.7)
Neutrophils Relative %: 85 %
Platelets: 761 10*3/uL — ABNORMAL HIGH (ref 150–400)
RBC: 5.15 MIL/uL — ABNORMAL HIGH (ref 3.87–5.11)
RDW: 14.6 % (ref 11.5–15.5)
WBC: 23.8 10*3/uL — ABNORMAL HIGH (ref 4.0–10.5)
nRBC: 1.5 % — ABNORMAL HIGH (ref 0.0–0.2)

## 2019-10-13 LAB — BLOOD GAS, VENOUS
Acid-base deficit: 18.8 mmol/L — ABNORMAL HIGH (ref 0.0–2.0)
Bicarbonate: 11.6 mmol/L — ABNORMAL LOW (ref 20.0–28.0)
O2 Saturation: 40.3 %
Patient temperature: 37
pCO2, Ven: 44 mmHg (ref 44.0–60.0)
pH, Ven: 7.03 — CL (ref 7.250–7.430)
pO2, Ven: 36 mmHg (ref 32.0–45.0)

## 2019-10-13 LAB — URINE DRUG SCREEN, QUALITATIVE (ARMC ONLY)
Amphetamines, Ur Screen: NOT DETECTED
Barbiturates, Ur Screen: NOT DETECTED
Benzodiazepine, Ur Scrn: NOT DETECTED
Cannabinoid 50 Ng, Ur ~~LOC~~: NOT DETECTED
Cocaine Metabolite,Ur ~~LOC~~: NOT DETECTED
MDMA (Ecstasy)Ur Screen: NOT DETECTED
Methadone Scn, Ur: NOT DETECTED
Opiate, Ur Screen: NOT DETECTED
Phencyclidine (PCP) Ur S: NOT DETECTED
Tricyclic, Ur Screen: POSITIVE — AB

## 2019-10-13 LAB — FIBRIN DERIVATIVES D-DIMER (ARMC ONLY): Fibrin derivatives D-dimer (ARMC): 1897.61 ng/mL (FEU) — ABNORMAL HIGH (ref 0.00–499.00)

## 2019-10-13 LAB — BLOOD GAS, ARTERIAL
Acid-base deficit: 10.4 mmol/L — ABNORMAL HIGH (ref 0.0–2.0)
Bicarbonate: 18.8 mmol/L — ABNORMAL LOW (ref 20.0–28.0)
FIO2: 0.36
O2 Saturation: 86.6 %
Patient temperature: 37
pCO2 arterial: 54 mmHg — ABNORMAL HIGH (ref 32.0–48.0)
pH, Arterial: 7.15 — CL (ref 7.350–7.450)
pO2, Arterial: 68 mmHg — ABNORMAL LOW (ref 83.0–108.0)

## 2019-10-13 LAB — ETHANOL: Alcohol, Ethyl (B): <10 mg/dL (ref <10)

## 2019-10-13 LAB — TROPONIN I (HIGH SENSITIVITY)
Troponin I (High Sensitivity): 629 ng/L (ref <18)
Troponin I (High Sensitivity): 719 ng/L (ref <18)
Troponin I (High Sensitivity): 661 ng/L (ref <18)

## 2019-10-13 LAB — LACTATE DEHYDROGENASE: LDH: 328 U/L — ABNORMAL HIGH (ref 98–192)

## 2019-10-13 LAB — BETA-HYDROXYBUTYRIC ACID: Beta-Hydroxybutyric Acid: 7.75 mmol/L — ABNORMAL HIGH (ref 0.05–0.27)

## 2019-10-13 LAB — HIV ANTIBODY (ROUTINE TESTING W REFLEX): HIV Screen 4th Generation wRfx: NONREACTIVE

## 2019-10-13 LAB — TRIGLYCERIDES: Triglycerides: 614 mg/dL — ABNORMAL HIGH (ref <150)

## 2019-10-13 LAB — C-REACTIVE PROTEIN
CRP: 7.7 mg/dL — ABNORMAL HIGH (ref <1.0)
CRP: 6.9 mg/dL — ABNORMAL HIGH (ref <1.0)

## 2019-10-13 LAB — FERRITIN: Ferritin: 120 ng/mL (ref 11–307)

## 2019-10-13 LAB — PROCALCITONIN: Procalcitonin: 0.18 ng/mL

## 2019-10-13 LAB — FIBRINOGEN: Fibrinogen: 664 mg/dL — ABNORMAL HIGH (ref 210–475)

## 2019-10-13 LAB — ABO/RH: ABO/RH(D): A POS

## 2019-10-13 MED ORDER — SUCCINYLCHOLINE CHLORIDE 200 MG/10ML IV SOSY
PREFILLED_SYRINGE | INTRAVENOUS | Status: AC
  Filled 2019-10-13: qty 10

## 2019-10-13 MED ORDER — SODIUM CHLORIDE 0.9% FLUSH
3.0000 mL | Freq: Two times a day (BID) | INTRAVENOUS | Status: DC
  Administered 2019-10-13 – 2019-10-25 (×23): 3 mL via INTRAVENOUS

## 2019-10-13 MED ORDER — DEXTROSE-NACL 5-0.45 % IV SOLN
INTRAVENOUS | Status: DC
  Administered 2019-10-13 – 2019-10-14 (×2): via INTRAVENOUS

## 2019-10-13 MED ORDER — PANTOPRAZOLE SODIUM 40 MG PO PACK
40.0000 mg | PACK | Freq: Every day | ORAL | Status: DC
  Administered 2019-10-14 – 2019-10-18 (×5): 40 mg
  Filled 2019-10-13 (×3): qty 20

## 2019-10-13 MED ORDER — ALBUTEROL SULFATE HFA 108 (90 BASE) MCG/ACT IN AERS
2.0000 | INHALATION_SPRAY | Freq: Four times a day (QID) | RESPIRATORY_TRACT | Status: DC | PRN
  Filled 2019-10-13: qty 6.7

## 2019-10-13 MED ORDER — SODIUM CHLORIDE 0.9 % IV BOLUS
1000.0000 mL | Freq: Once | INTRAVENOUS | Status: AC
  Administered 2019-10-13: 1000 mL via INTRAVENOUS

## 2019-10-13 MED ORDER — DEXTROSE 50 % IV SOLN: 25.0000 mL | INTRAVENOUS | Status: DC | PRN

## 2019-10-13 MED ORDER — MIDAZOLAM HCL 2 MG/2ML IJ SOLN
INTRAMUSCULAR | Status: AC
  Filled 2019-10-13: qty 4

## 2019-10-13 MED ORDER — SODIUM CHLORIDE 0.9 % IV SOLN
100.0000 mg | INTRAVENOUS | Status: AC
  Administered 2019-10-14 – 2019-10-17 (×4): 100 mg via INTRAVENOUS
  Filled 2019-10-13 (×6): qty 20

## 2019-10-13 MED ORDER — FENTANYL CITRATE (PF) 100 MCG/2ML IJ SOLN
INTRAMUSCULAR | Status: AC
  Administered 2019-10-13: 50 ug via INTRAVENOUS
  Filled 2019-10-13: qty 2

## 2019-10-13 MED ORDER — LORAZEPAM 2 MG/ML IJ SOLN
1.0000 mg | Freq: Once | INTRAMUSCULAR | Status: AC
  Administered 2019-10-13: 1 mg via INTRAVENOUS
  Filled 2019-10-13: qty 1

## 2019-10-13 MED ORDER — MIDAZOLAM HCL 2 MG/2ML IJ SOLN
2.0000 mg | INTRAMUSCULAR | Status: DC | PRN
  Administered 2019-10-14 – 2019-10-16 (×3): 2 mg via INTRAVENOUS
  Filled 2019-10-13: qty 2

## 2019-10-13 MED ORDER — ETOMIDATE 2 MG/ML IV SOLN
INTRAVENOUS | Status: AC
  Administered 2019-10-13: 20 mg via INTRAVENOUS
  Filled 2019-10-13: qty 20

## 2019-10-13 MED ORDER — ROCURONIUM BROMIDE 10 MG/ML (PF) SYRINGE
1.0000 mg/kg | PREFILLED_SYRINGE | Freq: Once | INTRAVENOUS | Status: AC
  Administered 2019-10-13: 80 mg via INTRAVENOUS

## 2019-10-13 MED ORDER — FENTANYL 2500MCG IN NS 250ML (10MCG/ML) PREMIX INFUSION
50.0000 ug/h | INTRAVENOUS | Status: DC
  Administered 2019-10-13: 50 ug/h via INTRAVENOUS
  Administered 2019-10-14 – 2019-10-15 (×4): 200 ug/h via INTRAVENOUS
  Administered 2019-10-16: 100 ug/h via INTRAVENOUS
  Administered 2019-10-16: 200 ug/h via INTRAVENOUS
  Filled 2019-10-13 (×6): qty 250

## 2019-10-13 MED ORDER — VANCOMYCIN HCL IN DEXTROSE 1-5 GM/200ML-% IV SOLN: 1000.0000 mg | Freq: Once | INTRAVENOUS | Status: DC

## 2019-10-13 MED ORDER — LACTATED RINGERS IV BOLUS
1000.0000 mL | Freq: Once | INTRAVENOUS | Status: AC
  Administered 2019-10-13: 1000 mL via INTRAVENOUS

## 2019-10-13 MED ORDER — FENTANYL BOLUS VIA INFUSION
50.0000 ug | INTRAVENOUS | Status: DC | PRN
  Administered 2019-10-16 (×2): 50 ug via INTRAVENOUS
  Filled 2019-10-13: qty 50

## 2019-10-13 MED ORDER — INSULIN REGULAR(HUMAN) IN NACL 100-0.9 UT/100ML-% IV SOLN
INTRAVENOUS | Status: DC
  Administered 2019-10-14: 3.3 [IU]/h via INTRAVENOUS
  Filled 2019-10-13: qty 100

## 2019-10-13 MED ORDER — POTASSIUM CHLORIDE 20 MEQ/15ML (10%) PO SOLN
40.0000 meq | Freq: Once | ORAL | Status: AC
  Administered 2019-10-13: 40 meq
  Filled 2019-10-13: qty 30

## 2019-10-13 MED ORDER — SODIUM BICARBONATE 8.4 % IV SOLN
50.0000 meq | Freq: Once | INTRAVENOUS | Status: AC
  Administered 2019-10-13: 50 meq via INTRAVENOUS
  Filled 2019-10-13: qty 50

## 2019-10-13 MED ORDER — INSULIN REGULAR BOLUS VIA INFUSION
0.0000 [IU] | Freq: Three times a day (TID) | INTRAVENOUS | Status: DC
  Filled 2019-10-13: qty 10

## 2019-10-13 MED ORDER — SODIUM CHLORIDE 0.9 % IV SOLN
200.0000 mg | Freq: Once | INTRAVENOUS | Status: AC
  Administered 2019-10-13: 200 mg via INTRAVENOUS
  Filled 2019-10-13: qty 40

## 2019-10-13 MED ORDER — SODIUM BICARBONATE 8.4 % IV SOLN
INTRAVENOUS | Status: AC
  Administered 2019-10-13: 50 meq
  Filled 2019-10-13: qty 100

## 2019-10-13 MED ORDER — ORAL CARE MOUTH RINSE
15.0000 mL | OROMUCOSAL | Status: DC
  Administered 2019-10-13 – 2019-10-18 (×43): 15 mL via OROMUCOSAL

## 2019-10-13 MED ORDER — INSULIN REGULAR(HUMAN) IN NACL 100-0.9 UT/100ML-% IV SOLN
INTRAVENOUS | Status: DC
  Administered 2019-10-13: 4.9 [IU]/h via INTRAVENOUS
  Filled 2019-10-13: qty 100

## 2019-10-13 MED ORDER — ZINC SULFATE 220 (50 ZN) MG PO CAPS
220.0000 mg | ORAL_CAPSULE | Freq: Every day | ORAL | Status: DC
  Administered 2019-10-14 – 2019-10-25 (×12): 220 mg
  Filled 2019-10-13 (×13): qty 1

## 2019-10-13 MED ORDER — SODIUM CHLORIDE 0.9 % IV SOLN
INTRAVENOUS | Status: DC
  Administered 2019-10-13 – 2019-10-21 (×4): via INTRAVENOUS

## 2019-10-13 MED ORDER — ROCURONIUM BROMIDE 10 MG/ML (PF) SYRINGE
PREFILLED_SYRINGE | INTRAVENOUS | Status: AC
  Filled 2019-10-13: qty 10

## 2019-10-13 MED ORDER — VITAMIN C 500 MG PO TABS
500.0000 mg | ORAL_TABLET | Freq: Every day | ORAL | Status: DC
  Administered 2019-10-14 – 2019-10-25 (×12): 500 mg
  Filled 2019-10-13 (×14): qty 1

## 2019-10-13 MED ORDER — SODIUM CHLORIDE 0.9 % IV SOLN
2.0000 g | Freq: Once | INTRAVENOUS | Status: AC
  Administered 2019-10-13: 2 g via INTRAVENOUS
  Filled 2019-10-13: qty 2

## 2019-10-13 MED ORDER — SODIUM CHLORIDE 0.9 % IV SOLN: 2.0000 g | Freq: Once | INTRAVENOUS | Status: DC

## 2019-10-13 MED ORDER — ENOXAPARIN SODIUM 40 MG/0.4ML ~~LOC~~ SOLN
40.0000 mg | Freq: Two times a day (BID) | SUBCUTANEOUS | Status: DC
  Administered 2019-10-13 – 2019-10-15 (×4): 40 mg via SUBCUTANEOUS
  Filled 2019-10-13 (×4): qty 0.4

## 2019-10-13 MED ORDER — ACETAMINOPHEN 325 MG PO TABS
650.0000 mg | ORAL_TABLET | Freq: Four times a day (QID) | ORAL | Status: DC | PRN
  Administered 2019-10-14 – 2019-10-25 (×15): 650 mg
  Filled 2019-10-13 (×15): qty 2

## 2019-10-13 MED ORDER — FENTANYL CITRATE (PF) 100 MCG/2ML IJ SOLN
50.0000 ug | Freq: Once | INTRAMUSCULAR | Status: AC
  Administered 2019-10-13: 17:00:00 50 ug via INTRAVENOUS

## 2019-10-13 MED ORDER — SODIUM CHLORIDE 0.9 % IV SOLN
INTRAVENOUS | Status: DC
  Administered 2019-10-13: 19:00:00 via INTRAVENOUS

## 2019-10-13 MED ORDER — LAMOTRIGINE 150 MG PO TABS
150.0000 mg | ORAL_TABLET | Freq: Every day | ORAL | Status: DC
  Administered 2019-10-14 – 2019-10-25 (×11): 150 mg
  Filled 2019-10-13 (×15): qty 1

## 2019-10-13 MED ORDER — MIDAZOLAM HCL 2 MG/2ML IJ SOLN
2.0000 mg | Freq: Once | INTRAMUSCULAR | Status: AC
  Administered 2019-10-13: 2 mg via INTRAVENOUS

## 2019-10-13 MED ORDER — CHLORHEXIDINE GLUCONATE 0.12% ORAL RINSE (MEDLINE KIT)
15.0000 mL | Freq: Two times a day (BID) | OROMUCOSAL | Status: DC
  Administered 2019-10-13 – 2019-10-17 (×9): 15 mL via OROMUCOSAL

## 2019-10-13 MED ORDER — CHLORHEXIDINE GLUCONATE CLOTH 2 % EX PADS
6.0000 | MEDICATED_PAD | Freq: Every day | CUTANEOUS | Status: DC
  Administered 2019-10-14 – 2019-10-23 (×10): 6 via TOPICAL

## 2019-10-13 MED ORDER — VANCOMYCIN HCL 10 G IV SOLR
2000.0000 mg | Freq: Once | INTRAVENOUS | Status: AC
  Administered 2019-10-13: 2000 mg via INTRAVENOUS
  Filled 2019-10-13: qty 2000

## 2019-10-13 MED ORDER — ETOMIDATE 2 MG/ML IV SOLN
20.0000 mg | Freq: Once | INTRAVENOUS | Status: AC
  Administered 2019-10-13: 17:00:00 20 mg via INTRAVENOUS

## 2019-10-13 MED ORDER — MIDAZOLAM HCL 2 MG/2ML IJ SOLN
2.0000 mg | INTRAMUSCULAR | Status: DC | PRN
  Administered 2019-10-13 – 2019-10-15 (×2): 2 mg via INTRAVENOUS
  Filled 2019-10-13 (×4): qty 2

## 2019-10-13 MED ORDER — METRONIDAZOLE IN NACL 5-0.79 MG/ML-% IV SOLN
500.0000 mg | Freq: Once | INTRAVENOUS | Status: DC
  Filled 2019-10-13: qty 100

## 2019-10-13 NOTE — ED Provider Notes (Signed)
I have discussed with cardiology on-call who feels like her elevated troponin is likely demand related.  At this time she is localizing to pain and appears to have a gag reflex.  She remains very altered however.   Earleen Newport, MD 10/13/19 [phone]

## 2019-10-13 NOTE — ED Provider Notes (Signed)
Patient has been accepted in transfer by Dr. Bonner Puna to [facility].  He agrees with plan, we will hold for steroids right now due to her hyperglycemia.   Earleen Newport, MD 10/13/19 1105

## 2019-10-13 NOTE — ED Notes (Signed)
Pt being transported to CT with tech and MD at this time

## 2019-10-13 NOTE — ED Triage Notes (Signed)
Pt to ED via EMS from home c/o AMS.  Patient disoriented and not answering questions appropriately, is alert and responds to voice.  Pt pulling off mask continuously and kicking off blankets stating "it's too hot".  Pt presents with mottled skin and cold, abrasions to bilateral ankles.  Per EMS pt husband found pt lying on the floor, unresponsive, unsure how long down, naked, very cold house.

## 2019-10-13 NOTE — Progress Notes (Signed)
Pt arrived on the unit restless, nonverbal, could not open eyes or follow commands. O2 on 6L in 80. MD at the bedside. Pt was intubated, NG and Central line inserted emergently. Husband on on the phone and said : "Do everything that needed to be done." X ray done. Arrived on 5.7 Insulin drip. Sugar checked 279, Titrated to 4.4 per glucose stabilizer. Arrived with temp Foley. Display 36.8C. Bed in low position, alarms are on. No belongings, except marriage ring.  At this time, VS stable.

## 2019-10-13 NOTE — ED Notes (Signed)
[facility]  CALLED  FOR  TRANSFER 

## 2019-10-13 NOTE — ED Notes (Signed)
[facility] arrived to transport pt 

## 2019-10-13 NOTE — Progress Notes (Signed)
NAME:  Maria Bentley, MRN:  [id number], DOB:  03/08/1971, LOS: 0 ADMISSION DATE:  10/13/2019, CONSULTATION DATE: November 2 REFERRING MD: Dr. Jimmye Norman, CHIEF COMPLAINT: Confusion  Brief History   48 year old female with diabetes mellitus presented to the [facility] with DKA and COVID-19 pneumonia.  Transferred to [facility] and was intubated on arrival for inability to protect airway and hypoxemic respiratory failure.  History of present illness   See details above, briefly this is a 48 year old female with DKA and Covid pneumonia who was transferred to our facility today after she went to the emergency department.  She was treated there with IV fluids and insulin.  She had confusion, elevated troponin, was seen by cardiology.  She was transferred to our facility for further management.  Upon arrival she was mottled, confused, and profoundly hypoxemic.  Past Medical History  Diabetes mellitus Hypertension Hyperlipidemia Morbid obesity  Significant Hospital Events   November 2 admission  Consults:  PCCM  Procedures:  November 2 endotracheal tube November 2 left subclavian central venous line  Significant Diagnostic Tests:  11/2 CT head > NAICP  Micro Data:  11/2 blood >   Antimicrobials:  11/2 remdesivir >   Interim history/subjective:    Objective   Blood pressure 129/80, pulse (!) 127, resp. rate (!) 28, last menstrual period 05/09/2016, SpO2 95 %.    Vent Mode: PRVC FiO2 (%):  [60 %-100 %] 60 % Set Rate:  [28 bmp] 28 bmp Vt Set:  [430 mL] 430 mL PEEP:  [10 cmH20] 10 cmH20 Plateau Pressure:  [28 cmH20] 28 cmH20  No intake or output data in the 24 hours ending 10/13/19 1848 There were no vitals filed for this visit.  Examination:  General:  Tachypneic, confused in bed HENT: NCAT OP clear PULM: CTA B, normal effort CV: RRR, no mgr GI: BS+, soft, nontender MSK: normal bulk and tone Derm: mottled throughout legs, arms  Neuro: confused, doesn't follow commands   Resolved Hospital Problem list     Assessment & Plan:  Severe acute respiratory failure with hypoxemia due to COVID-19 pneumonia: Intubate now Continue mechanical ventilation per ARDS protocol Target TVol 6-8cc/kgIBW Target Plateau Pressure < 30cm H20 Target driving pressure less than 15 cm of water Target PaO2 55-65: titrate PEEP/FiO2 per protocol As long as PaO2 to FiO2 ratio is less than 1:150 position in prone position for 16 hours a day Check CVP daily if CVL in place Target CVP less than 4, diurese as necessary Ventilator associated pneumonia prevention protocol Start remdesivir Hold Decadron until blood sugar improved Discussed convalescent plasma, will hold for now given lengthy history of anaphylaxis and allergies  Need for sedation/mechanical ventilation: RASS target -2 Fentanyl infusion As needed Versed  DKA: Insulin drip per protocol  Best practice:  Diet: tube feeding Pain/Anxiety/Delirium protocol (if indicated): as above VAP protocol (if indicated): yes DVT prophylaxis: lovenox GI prophylaxis: Pantoprazole for stress ulcer prophylaxis Glucose control: per DKA protocol Mobility: bed rest Code Status: ful Family Communication: per [facility] Disposition: remain in ICU  Labs   CBC: Recent Labs  Lab 10/13/19 0904 10/13/19 1824  WBC 23.8*  --   NEUTROABS 20.0*  --   HGB 15.3* 13.3  HCT 52.2* 39.0  MCV 101.4*  --   PLT 761*  --     Basic Metabolic Panel: Recent Labs  Lab 10/13/19 0904 10/13/19 1534 10/13/19 1824  NA 152* 156* 159*  K 4.5 4.1 3.3*  CL 114* 120*  --  CO2 11* 20*  --   GLUCOSE 709* 396*  --   BUN 53* 56*  --   CREATININE 2.44* 2.38*  --   CALCIUM 9.5 9.0  --    GFR: Estimated Creatinine Clearance: 32.8 mL/min (A) (by C-G formula based on SCr of 2.38 mg/dL (H)). Recent Labs  Lab 10/13/19 0904  PROCALCITON 0.18  WBC 23.8*  LATICACIDVEN 1.7    Liver Function Tests: Recent Labs   Lab 10/13/19 0904  AST 14*  ALT 17  ALKPHOS 115  BILITOT 2.7*  PROT 8.0  ALBUMIN 3.3*   No results for input(s): LIPASE, AMYLASE in the last 168 hours. No results for input(s): AMMONIA in the last 168 hours.  ABG    Component Value Date/Time   PHART 7.302 (L) 10/13/2019 1824   PCO2ART 41.9 10/13/2019 1824   PO2ART 390.0 (H) 10/13/2019 1824   HCO3 20.8 10/13/2019 1824   TCO2 22 10/13/2019 1824   ACIDBASEDEF 6.0 (H) 10/13/2019 1824   O2SAT 100.0 10/13/2019 1824     Coagulation Profile: No results for input(s): INR, PROTIME in the last 168 hours.  Cardiac Enzymes: No results for input(s): CKTOTAL, CKMB, CKMBINDEX, TROPONINI in the last 168 hours.  HbA1C: Hemoglobin A1C  Date/Time Value Ref Range Status  09/19/2019 04:45 PM 14.7 (A) 4.0 - 5.6 % Final  08/19/2018 03:33 PM 8.7 (A) 4.0 - 5.6 % Final   Hgb A1c MFr Bld  Date/Time Value Ref Range Status  04/19/2018 12:28 PM 11.5 (H) 4.6 - 6.5 % Final    Comment:    Glycemic Control Guidelines for People with Diabetes:Non Diabetic:  <6%Goal of Therapy: <7%Additional Action Suggested:  >8%   06/27/2016 09:34 AM 9.1 (H) 4.6 - 6.5 % Final    Comment:    Glycemic Control Guidelines for People with Diabetes:Non Diabetic:  <6%Goal of Therapy: <7%Additional Action Suggested:  >8%     CBG: Recent Labs  Lab 10/13/19 1307 10/13/19 1411 10/13/19 1518 10/13/19 1701 10/13/19 1818  GLUCAP 451* 469* 344* 279* 158*    Review of Systems:   Cannot obtain due to intubation  Past Medical History  She,  has a past medical history of Anemia, Anxiety, Depression (2010), Diabetes mellitus, Endometriosis, Headache, common migraine, intractable, History of ovarian cyst, Hyperlipidemia, Hypertension, Kidney stones, Morbid obesity ([place]), Personal hx of gastric bypass (2010), Rosacea, and Vertigo.   Surgical History    Past Surgical History:  Procedure Laterality Date  . DILATION AND CURETTAGE OF UTERUS  2011  . gastric bypass    .  OVARIAN CYST REMOVAL    . ROTATOR CUFF REPAIR     right     Social History   reports that she quit smoking about 23 years ago. Her smoking use included cigarettes. She has a 4.00 pack-year smoking history. She has never used smokeless tobacco. She reports that she does not drink alcohol or use drugs.   Family History   Her family history includes Breast cancer (age of onset: 83) in her paternal grandmother; Depression in an other family member; Diabetes in her father and mother; Hypertension in her father and mother. There is no history of Cancer.   Allergies Allergies  Allergen Reactions  . Amoxicillin Hives and Rash  . Avocado Swelling  . Clarithromycin Hives and Rash  . Honey Anaphylaxis  . Other Hives and Swelling    All melon  . Sulfa Drugs Cross Reactors Anaphylaxis  . Watermelon Concentrate Hives and Swelling    Oral swelling,  ears swell shut  . Metoprolol Other (See Comments)    Hypotension  . Adhesive [Tape] Itching    Skin breakdown  . Levemir [Insulin Detemir] Rash    Red/swollen injection site reactions with residual lumps  . Victoza [Liraglutide] Nausea Only     Home Medications  Prior to Admission medications   Medication Sig Start Date End Date Taking? Authorizing Provider  acetaminophen (TYLENOL) 325 MG tablet Take by mouth.    [provider]  albuterol (VENTOLIN HFA) 108 (90 Base) MCG/ACT inhaler Inhale 2 puffs into the lungs every 6 (six) hours as needed for wheezing or shortness of breath. 10/02/19   Terald Sleeper, PA-C  atorvastatin (LIPITOR) 20 MG tablet TAKE 1 TABLET BY MOUTH EVERY DAY 05/16/19   [name], MD  B-D INS SYR MICROFINE 1CC/28G 28G X 1/2" 1 ML MISC USE 1 SYRINGE DAILY AFTER SUPPER 01/10/16   [name], MD  benzonatate (TESSALON) 100 MG capsule Take 1-2 capsules (100-200 mg total) by mouth 3 (three) times daily as needed for cough. 10/02/19   Terald Sleeper, PA-C  blood glucose meter kit and supplies KIT Dispense based on  patient and insurance preference. Use up to four times daily as directed. (FOR ICD-E11.21) 08/22/16   [name], MD  buPROPion (WELLBUTRIN XL) 150 MG 24 hr tablet TAKE 3 TABLETS BY MOUTH IN THE MORNING 05/12/18   [provider]  Continuous Blood Gluc Receiver (FREESTYLE LIBRE 14 DAY READER) DEVI 1 applicator by Does not apply route 4 (four) times daily. 11/24/17   [name], MD  Continuous Blood Gluc Sensor (FREESTYLE LIBRE 14 DAY SENSOR) MISC Inject 1 application into the skin 4 (four) times daily as needed (uncontrolled diabetes). 09/19/19   [name], MD  cyanocobalamin (,VITAMIN B-12,) 1000 MCG/ML injection INJECT 1 ML (1,000 MCG TOTAL) INTO THE MUSCLE ONCE A WEEK. FOR 4 WEEKS, THEN MONTHLY THEREAFTER 02/18/19   [name], MD  cyclobenzaprine (FLEXERIL) 10 MG tablet TAKE 1/2 TO 1 UP TO THREE TIMES DAILY AS NEEDED. DROWSINESS PRECAUTIONS 05/21/18   [provider]  diltiazem (CARDIZEM CD) 180 MG 24 hr capsule TAKE 1 CAPSULE BY MOUTH EVERY DAY 08/29/19   Wellington Hampshire, MD  EPINEPHrine 0.3 mg/0.3 mL IJ SOAJ injection  06/25/10   [provider]  escitalopram (LEXAPRO) 20 MG tablet Take 20 mg by mouth daily.  02/27/17   [provider]  fluticasone (FLONASE) 50 MCG/ACT nasal spray SPRAY 2 SPRAYS INTO EACH NOSTRIL EVERY DAY 09/29/19   [name], MD  HYDROcodone-acetaminophen (NORCO) 10-325 MG tablet Take 1 tablet by mouth every 6 (six) hours as needed for up to 7 days for severe pain (headache). 10/06/19 10/13/19  [name], MD  INS SYRINGE/NEEDLE 1CC/28G (B-D INS SYR MICROFINE 1CC/28G) 28G X 1/2" 1 ML MISC USE 1 SYRINGE DAILY AFTER SUPPER 08/01/18   [name], MD  insulin aspart (NOVOLOG FLEXPEN) 100 UNIT/ML FlexPen Inject 15 units + sliding scale under the skin with meals. Add 3-5 units for every blood sugar 50 mg/dL over 150. Max dose of 30 units with meals. 08/19/18   [name], MD  Insulin Glargine (BASAGLAR KWIKPEN) 100  UNIT/ML SOPN INJECT 50 UNITS TOTAL INTO THE SKIN DAILY. 09/15/19   [name], MD  Insulin Pen Needle (BD PEN NEEDLE NANO U/F) 32G X 4 MM MISC Please specify directions, refills and quantity 08/22/18   [name], MD  Insulin Syringe-Needle U-100 (B-D INS SYR ULTRAFINE 1CC/31G) 31G X 5/16" 1 ML MISC 1 Syringe by Does not apply route daily. 10/13/13   [name], MD  lamoTRIgine (LAMICTAL) 150 MG tablet Take 150 mg by mouth daily.    [provider]  Lancet Device MISC Test blood sugar two times daily 07/29/12   [name], MD  LORazepam (ATIVAN) 0.5 MG tablet Take 0.5-1 mg by mouth daily as needed.     [provider]  meclizine (ANTIVERT) 25 MG tablet Take 1 tablet (25 mg total) by mouth 3 (three) times daily as needed for dizziness. 06/16/15   [name], MD  metFORMIN (GLUCOPHAGE-XR) 500 MG 24 hr tablet TAKE 4 TABLETS BY MOUTH EVERY DAY WITH BREAKFAST 08/14/19   [name], MD  NURTEC 75 MG TBDP Take 1 tablet by mouth daily as needed. 09/19/19   [provider]  nystatin (MYCOSTATIN/NYSTOP) powder APPLY 1 APPLICATION TO AFFECTED AREA TWICE A DAY Patient taking differently: as needed.  03/12/19   [name], MD  OnabotulinumtoxinA (BOTOX IJ) Inject as directed every 3 (three) months. For migraine    [provider]  ONETOUCH VERIO test strip TEST BLOOD SUGAR 5 TIMES DAILY 09/03/18   [name], MD  oxyCODONE-acetaminophen (PERCOCET) 10-325 MG tablet Take 1 tablet by mouth every 6 (six) hours as needed for pain.    [provider]  predniSONE (DELTASONE) 10 MG tablet Take 5 tablets (50 mg total) by mouth daily. 10/05/19   Triplett, Cari B, FNP  SYRINGE-NEEDLE, DISP, 3 ML (BD INTEGRA SYRINGE) 25G X 1" 3 ML MISC FOR USE WITH B12 INJECTIONS WEEKLY/MONTHLY 08/01/18   [name], MD  Vitamin D, Ergocalciferol, (DRISDOL) 1.25 MG (50000 UT) CAPS capsule TAKE 1 CAPSULE BY MOUTH ONCE A WEEK 05/07/19   [name], MD      Critical care time: 45 minutes    Roselie Awkward, MD [place] Pager: [phone] Cell: [phone] If no response, call [phone]

## 2019-10-13 NOTE — Progress Notes (Signed)
Patient transported to 208-3 without incidence.

## 2019-10-13 NOTE — Progress Notes (Signed)
Results for Maria Bentley, Maria Bentley "CINDY" (MRN [id number]) as of 10/13/2019 19:54  Ref. Range 10/13/2019 18:00  Troponin I (High Sensitivity) Latest Ref Range: <18 ng/L [facility])    Critical Result reported to Dr. Myna Hidalgo via Amion at this time.

## 2019-10-13 NOTE — Consult Note (Signed)
PHARMACY -  BRIEF ANTIBIOTIC NOTE   Pharmacy has received consult(s) for vancomycin and aztreonam (with authorization to change to cefepime after investigation of allergy) from an ED provider. Patient has also been ordered metronidazole. The patient's profile has been reviewed for ht/wt/allergies/indication/available labs.    Patient has documented allergy of hives/rash to amoxicillin. Per chart review patient has tolerated cephalosporins in the past.   One time order(s) placed for  -Cefepime 2 g IV  -Vancomycin LD 2 g IV   Further antibiotics/pharmacy consults should be ordered by admitting physician if indicated.                       Thank you, [place] Resident 10/13/2019  10:05 AM

## 2019-10-13 NOTE — H&P (Signed)
History and Physical   Maria Bentley KBT:[id number] DOB: [birth year] DOA: 10/13/2019  Referring MD/NP/PA: Dr. Jimmye Norman, [place] PCP: [name], MD  Patient coming from: Home by way of [facility] ED  Chief Complaint: Unresponsive  HPI: Maria Bentley is a 48 y.o. female with a history of poorly-controlled diabetes, recurrent rectal abscesses, morbid obesity s/p gastric bypass with vitamin D deficiency, depression and anxiety who presented to the ED at [facility] 11/2 after being found on the bedroom floor by her husband. They both have covid-19, and he has been sick, nearly bed-ridden all weekend so is not aware of her last couple days. He had heard her getting up to the kitchen and bathroom regularly, not sure if she was taking medications as usual or not, though she struggles with that at times. This morning he wasn't able to get her off the floor, so called EMS. On arrival to the ED she felt cool and appeared mottled with hypothermia, sinus tachycardia and hypoxia requiring 2L O2. UA without signs of infection, but with leukocytosis treated as sepsis with bilateral CXR infiltrates on hypoexpanded film with vancomycin, cefepime, flagyl. pH initially 7.03, gap 27, gave 2L warm saline, and starting insulin gtt. HR 120, no hypotension, RR 25/min, 98% on 2L O2. Remained confused, removing mask, and on admission to [facility] ICU was obtunded and intubated on arrival.   Review of Systems: Unable to obtain due to patient's mental confusion.  Past Medical History:  Diagnosis Date   Anemia    Anxiety    Depression 2010   Diabetes mellitus    Endometriosis    Headache, common migraine, intractable    History of ovarian cyst    left sided, caused by her endometriosis (possibly an endometrioma), in her 31s.   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 Has never made suicidal gesture/attempt.   reports that she quit smoking about 23 years ago. Her smoking use included cigarettes. She has a 4.00 pack-year smoking history. She has never used smokeless tobacco. She reports that she does not drink alcohol or use drugs. Allergies  Allergen Reactions   Amoxicillin Hives and Rash   Avocado Swelling   Clarithromycin Hives and Rash   Honey Anaphylaxis   Other Hives and Swelling    All melon   Sulfa Drugs Cross Reactors Anaphylaxis   Watermelon Concentrate Hives and Swelling    Oral swelling, ears swell shut   Metoprolol Other (See Comments)    Hypotension   Adhesive [Tape] Itching    Skin breakdown   Levemir [Insulin Detemir] Rash    Red/swollen injection site reactions with residual lumps   Victoza [Liraglutide] Nausea Only   Family History  Problem Relation Age of Onset   Diabetes Mother    Hypertension Mother    Hypertension Father    Diabetes Father    Depression Other        strong hx of   Breast cancer  Paternal Grandmother 10   Cancer Neg Hx    - Family history otherwise reviewed and not pertinent.  Prior to Admission medications   Medication Sig Start Date End Date Taking? Authorizing Provider  acetaminophen (TYLENOL) 325 MG tablet Take by mouth.    [provider]  albuterol (VENTOLIN HFA) 108 (90 Base) MCG/ACT inhaler Inhale 2 puffs into the lungs every 6 (six) hours as needed for wheezing or shortness of breath. 10/02/19   Terald Sleeper, PA-C  atorvastatin (LIPITOR) 20 MG tablet TAKE 1 TABLET BY MOUTH EVERY DAY 05/16/19   [name], MD  B-D INS SYR MICROFINE 1CC/28G 28G X 1/2" 1 ML MISC USE 1 SYRINGE DAILY AFTER SUPPER  01/10/16   [name], MD  benzonatate (TESSALON) 100 MG capsule Take 1-2 capsules (100-200 mg total) by mouth 3 (three) times daily as needed for cough. 10/02/19   Terald Sleeper, PA-C  blood glucose meter kit and supplies KIT Dispense based on patient and insurance preference. Use up to four times daily as directed. (FOR ICD-E11.21) 08/22/16   [name], MD  buPROPion (WELLBUTRIN XL) 150 MG 24 hr tablet TAKE 3 TABLETS BY MOUTH IN THE MORNING 05/12/18   [provider]  Continuous Blood Gluc Receiver (FREESTYLE LIBRE 14 DAY READER) DEVI 1 applicator by Does not apply route 4 (four) times daily. 11/24/17   [name], MD  Continuous Blood Gluc Sensor (FREESTYLE LIBRE 14 DAY SENSOR) MISC Inject 1 application into the skin 4 (four) times daily as needed (uncontrolled diabetes). 09/19/19   [name], MD  cyanocobalamin (,VITAMIN B-12,) 1000 MCG/ML injection INJECT 1 ML (1,000 MCG TOTAL) INTO THE MUSCLE ONCE A WEEK. FOR 4 WEEKS, THEN MONTHLY THEREAFTER 02/18/19   [name], MD  cyclobenzaprine (FLEXERIL) 10 MG tablet TAKE 1/2 TO 1 UP TO THREE TIMES DAILY AS NEEDED. DROWSINESS PRECAUTIONS 05/21/18   [provider]  diltiazem (CARDIZEM CD) 180 MG 24 hr capsule TAKE 1 CAPSULE BY MOUTH EVERY DAY 08/29/19   Wellington Hampshire, MD  EPINEPHrine 0.3 mg/0.3 mL IJ SOAJ injection  06/25/10   [provider]  escitalopram (LEXAPRO) 20 MG tablet Take 20 mg by mouth daily.  02/27/17   [provider]  fluticasone (FLONASE) 50 MCG/ACT nasal spray SPRAY 2 SPRAYS INTO EACH NOSTRIL EVERY DAY 09/29/19   [name], MD  HYDROcodone-acetaminophen (NORCO) 10-325 MG tablet Take 1 tablet by mouth every 6 (six) hours as needed for up to 7 days for severe pain (headache). 10/06/19 10/13/19  [name], MD  INS SYRINGE/NEEDLE 1CC/28G (B-D INS SYR MICROFINE 1CC/28G) 28G X 1/2" 1 ML MISC USE 1 SYRINGE DAILY AFTER SUPPER 08/01/18   [name], MD  insulin aspart  (NOVOLOG FLEXPEN) 100 UNIT/ML FlexPen Inject 15 units + sliding scale under the skin with meals. Add 3-5 units for every blood sugar 50 mg/dL over 150. Max dose of 30 units with meals. 08/19/18   [name], MD  Insulin Glargine (BASAGLAR KWIKPEN) 100 UNIT/ML SOPN INJECT 50 UNITS TOTAL INTO THE SKIN DAILY. 09/15/19   [name], MD  Insulin Pen Needle (BD PEN NEEDLE NANO U/F) 32G X 4 MM MISC Please specify directions, refills and quantity 08/22/18   [name], MD  Insulin Syringe-Needle U-100 (B-D INS SYR ULTRAFINE 1CC/31G) 31G X 5/16" 1 ML MISC 1 Syringe by Does not apply route daily. 10/13/13   [name], MD  lamoTRIgine (LAMICTAL)  150 MG tablet Take 150 mg by mouth daily.    [provider]  Lancet Device MISC Test blood sugar two times daily 07/29/12   [name], MD  LORazepam (ATIVAN) 0.5 MG tablet Take 0.5-1 mg by mouth daily as needed.     [provider]  meclizine (ANTIVERT) 25 MG tablet Take 1 tablet (25 mg total) by mouth 3 (three) times daily as needed for dizziness. 06/16/15   [name], MD  metFORMIN (GLUCOPHAGE-XR) 500 MG 24 hr tablet TAKE 4 TABLETS BY MOUTH EVERY DAY WITH BREAKFAST 08/14/19   [name], MD  NURTEC 75 MG TBDP Take 1 tablet by mouth daily as needed. 09/19/19   [provider]  nystatin (MYCOSTATIN/NYSTOP) powder APPLY 1 APPLICATION TO AFFECTED AREA TWICE A DAY Patient taking differently: as needed.  03/12/19   [name], MD  OnabotulinumtoxinA (BOTOX IJ) Inject as directed every 3 (three) months. For migraine    [provider]  ONETOUCH VERIO test strip TEST BLOOD SUGAR 5 TIMES DAILY 09/03/18   [name], MD  oxyCODONE-acetaminophen (PERCOCET) 10-325 MG tablet Take 1 tablet by mouth every 6 (six) hours as needed for pain.    [provider]  predniSONE (DELTASONE) 10 MG tablet Take 5 tablets (50 mg total) by mouth daily. 10/05/19   Triplett, Cari B, FNP  SYRINGE-NEEDLE, DISP, 3 ML (BD  INTEGRA SYRINGE) 25G X 1" 3 ML MISC FOR USE WITH B12 INJECTIONS WEEKLY/MONTHLY 08/01/18   [name], MD  Vitamin D, Ergocalciferol, (DRISDOL) 1.25 MG (50000 UT) CAPS capsule TAKE 1 CAPSULE BY MOUTH ONCE A WEEK 05/07/19   [name], MD    Physical Exam: Vitals:   10/13/19 1710  BP: 129/80  Pulse: (!) 127  Resp: (!) 28  SpO2: 95%   Constitutional: 48 y.o. female altered, not meaningfully responsive or following commands Eyes: Lids and conjunctivae normal, PERRL ENMT: Mucous membranes are dryt. Posterior pharynx clear of any exudate or lesions. Poor dentition.  Neck: Normal, supple, no masses, no thyromegaly Respiratory: Tachypneic, diminished, crackles.  Cardiovascular: Regular tachycardia, no murmurs, rubs, or gallops. No carotid bruits. No JVD. No LE edema. Palpable pedal pulses. Abdomen: + bowel sounds. No tenderness, non-distended, and no masses palpated. No hepatosplenomegaly. GU: + indwelling catheter Musculoskeletal: No clubbing. No joint deformity upper and lower extremities. No contractures. Normal muscle tone. Cushingoid habitus. Skin: Cool, dry, mottled, acrocyanosis. Decreased turgor. Right perirectal area with hyperpigmentation does not appear to cause pain on palpation, not warm, no induration or fluctuance or erythema. Neurologic: Not cooperative with exam, may have intentionally turned head toward loud voice but not definitely Psychiatric: UTD. Not oriented.  Labs on Admission: I have personally reviewed following labs and imaging studies  CBC: Recent Labs  Lab 10/13/19 0904 10/13/19 1824  WBC 23.8*  --   NEUTROABS 20.0*  --   HGB 15.3* 13.3  HCT 52.2* 39.0  MCV 101.4*  --   PLT 761*  --    Basic Metabolic Panel: Recent Labs  Lab 10/13/19 0904 10/13/19 1534 10/13/19 1824  NA 152* 156* 159*  K 4.5 4.1 3.3*  CL 114* 120*  --   CO2 11* 20*  --   GLUCOSE 709* 396*  --   BUN 53* 56*  --   CREATININE 2.44* 2.38*  --   CALCIUM 9.5 9.0  --     Liver Function Tests: Recent Labs  Lab 10/13/19 0904  AST 14*  ALT  17  ALKPHOS 115  BILITOT 2.7*  PROT 8.0  ALBUMIN 3.3*   CBG: Recent Labs  Lab 10/13/19 1111 10/13/19 1307 10/13/19 1411 10/13/19 1518 10/13/19 1701  GLUCAP 551* 451* 469* 344* 279*   Lipid Profile: Recent Labs    10/13/19 0904  TRIG 614*   Anemia Panel: Recent Labs    10/13/19 0904  FERRITIN 120   Urine analysis:    Component Value Date/Time   COLORURINE YELLOW (A) 10/13/2019 0904   APPEARANCEUR HAZY (A) 10/13/2019 0904   LABSPEC 1.021 10/13/2019 0904   PHURINE 6.0 10/13/2019 0904   GLUCOSEU >=500 (A) 10/13/2019 0904   GLUCOSEU >=1000 (A) 12/27/2015 1603   HGBUR MODERATE (A) 10/13/2019 0904   BILIRUBINUR NEGATIVE 10/13/2019 0904   BILIRUBINUR neg 12/27/2015 1602   KETONESUR 80 (A) 10/13/2019 0904   PROTEINUR 100 (A) 10/13/2019 0904   UROBILINOGEN 0.2 12/27/2015 1603   NITRITE NEGATIVE 10/13/2019 0904   LEUKOCYTESUR NEGATIVE 10/13/2019 0904   Radiological Exams on Admission: Dg Abd 1 View  Result Date: 10/13/2019 CLINICAL DATA:  Intubation, central line, nasogastric tube placement, COVID-19 EXAM: ABDOMEN - 1 VIEW COMPARISON:  Portable exam 1725 hours compared to 05/08/2011 FINDINGS: Patient rotated to RIGHT. Tip of nasogastric tube projects over distal gastric antrum. Mild elevation of RIGHT diaphragm. Paucity of bowel gas. IMPRESSION: Tip of nasogastric tube projects over distal gastric antrum. Electronically Signed   By: Lavonia Dana M.D.   On: 10/13/2019 18:01   Ct Head Wo Contrast  Result Date: 10/13/2019 CLINICAL DATA:  Altered mental status and leukocytosis EXAM: CT HEAD WITHOUT CONTRAST TECHNIQUE: Contiguous axial images were obtained from the base of the skull through the vertex without intravenous contrast. COMPARISON:  November 22, 2009 FINDINGS: Brain: Ventricles are normal in size and configuration. There is no intracranial mass, hemorrhage, extra-axial fluid collection, or  midline shift. The brain parenchyma appears unremarkable. No acute infarct is evident. Vascular: There is no hyperdense vessel. No evident vascular calcifications. Skull: The bony calvarium appears intact. Sinuses/Orbits: Paranasal sinuses are clear. Orbits appear symmetric bilaterally. Other: Mastoid air cells are clear. IMPRESSION: Study within normal limits.3 Electronically Signed   By: Lowella Grip III M.D.   On: 10/13/2019 14:40   Dg Chest Port 1 View  Result Date: 10/13/2019 CLINICAL DATA:  Central line placement, NG tube placement EXAM: PORTABLE CHEST 1 VIEW COMPARISON:  10/13/2019 FINDINGS: Endotracheal tube with the tip 3.3 cm above the carina. Nasogastric tube coursing into the right upper quadrant. Left subclavian central venous catheter with the tip projecting over the SVC. Bilateral diffuse interstitial thickening. No pleural effusion or pneumothorax. Stable cardiomediastinal silhouette. No aggressive osseous lesion. IMPRESSION: 1. Endotracheal tube with the tip 3.3 cm above the carina. 2. Nasogastric tube coursing into the right upper quadrant. This is an atypical course for the nasogastric tube. Recommend further evaluation with a KUB. 3. Left subclavian central venous catheter with the tip projecting over the SVC. 4. Diffuse bilateral interstitial thickening unchanged compared with x-ray performed earlier same day. Electronically Signed   By: Kathreen Devoid   On: 10/13/2019 17:57   Dg Chest Port 1 View  Result Date: 10/13/2019 CLINICAL DATA:  Dyspnea. Additional history provided: Patient presents via EMS from home for altered mental status. EXAM: PORTABLE CHEST 1 VIEW COMPARISON:  Chest radiograph 10/05/2019 FINDINGS: Limited assessment due to patient rotation and shallow inspiration. Cardiomediastinal silhouette appears unchanged with heart size normal. Unchanged elevation of the right hemidiaphragm. Bilateral ill-defined opacities may reflect edema and/or infection.  No evidence of  pleural effusion or pneumothorax. No acute bony abnormality. IMPRESSION: Limited, shallow inspiration radiograph. Ill-defined opacities within the bilateral lungs may reflect edema and/or infection. Electronically Signed   By: Kellie Simmering DO   On: 10/13/2019 09:55   EKG: Independently reviewed. Sinus tachycardia unchanged from priors.  Assessment/Plan Active Problems:   OSA (obstructive sleep apnea)   Hyperlipidemia   Diabetes mellitus type 2, uncontrolled ([place])   Vitamin D deficiency   Noncompliance with diabetes treatment   Hypertension   COVID-19 virus infection   History of gastric bypass   DKA, type 2 (HCC)   Acute respiratory failure with hypoxia ([place])   Acute respiratory disease due to COVID-19 virus    IDT2DM with diabetic ketoacidosis with obtundation: HbA1c 14.7%. Pt reports she does struggle with nonadherence.  - IV insulin, IVF, serial metabolic panels. Some improvement thus far, but needs fluids. Will give LR bolus initially as well.   Acute hypoxic respiratory failure due to covid-19 pneumonia:  - Intubated, management per PCCM - Confirmed consent for remdesivir, will monitor LFTs - Received verbal consent for convalescent plasma with husband by phone at admission. Risks including anaphylaxis/transfusion reaction causing death were reviewed as well as use under EUA and no known benefit from RCT data at time of administration. He taught back and confirmed understanding of these risks and potential benefits and elects to proceed if we feel it is necessary. Given her extensive and severe reactions to medications and other substances, we will defer plasma for now, reevaluate in AM.  - Consider proning, based on repeat ABG.   AKI: Due to DKA.  - Monitor with fluids.  Leukocytosis: Suspected to be reactive, and in setting of steroid use PTA with PCT 0.18. No pyuria. Was initially hypothermic, though lactic acid reassuring.  - Continue monitoring.  - Monitor blood cultures  drawn in ED.   AMS: Likely due to DKA. CT head neg. Could consider LP if not improving with above measures.   Troponin elevation, demand ischemia: Cardiology evaluated prior to transfer, no further interventions recommended. No ischemic changes on ECG - Trend troponin. If not trending downward, consider 48 hrs heparin.   History of sinus tachycardia: This is a longstanding condition. Recent TSH 2.84.  - Hold diltiazem  Migraines:  - Hold prn medications  Obesity s/p gastric bypass:  - Nutrition per tube once more stable  History of rectal abscesses: Hyperpigmented area without erythema or induration or fluctuance on exam. Do not believe this is an active issue at this time.   Depression, anxiety: No history of ingestions, suicide attempts, etc. UDS positive only for tricyclic, otherwise negative, neg EtOH.   DVT prophylaxis: Lovenox 82m q12h  Code Status: Full confirmed with husband  Family Communication: Husband by phone for 20 minutes Disposition Plan: Uncertain, guarded prognosis Consults called: PCCM, Dr. MLake Bells Admission status: Inpatient    RPatrecia Pour MD [facility] [email or web address] 10/13/2019, 6:37 PM

## 2019-10-13 NOTE — ED Notes (Signed)
Pt has been full 1:1 safety sitter case.  This RN has provided that safety sitter until this time when switch out with Linus Orn, EDT.  Pt continues to pull off mask, trying to climb out of bed.

## 2019-10-13 NOTE — ED Provider Notes (Addendum)
[facility] Emergency Department Provider Note       Time seen: ----------------------------------------- 8:58 AM on 10/13/2019 ----------------------------------------- Level V caveat: History/ROS limited by altered mental status I have reviewed the triage vital signs and the nursing notes.  HISTORY   Chief Complaint Altered Mental Status   HPI Maria Bentley is a 48 y.o. female with a history of anemia, anxiety, depression, diabetes, hypertension, hyperlipidemia, kidney stones, morbid obesity, history of gastric bypass who presents to the ED for altered mental status.  Patient reportedly with a history of diabetes, blood sugar was around 2-300.  Patient was confused, recently has been Covid positive.  Was not cooperative for EMS in route.  Past Medical History:  Diagnosis Date  . Anemia   . Anxiety   . Depression 2010  . Diabetes mellitus   . Endometriosis   . Headache, common migraine, intractable   . History of ovarian cyst    left sided, caused by her endometriosis (possibly an endometrioma), in her 80s.  Marland Kitchen Hyperlipidemia   . Hypertension   . Kidney stones    left  . Morbid obesity ([place])    s/p gastric bypass  . Personal hx of gastric bypass 2010  . Rosacea   . Vertigo     Patient Active Problem List   Diagnosis Date Noted  . COVID-19 virus infection 10/06/2019  . Preoperative evaluation of a medical condition to rule out surgical contraindications (TAR required) 09/21/2019  . Abscess of right buttock 04/30/2019  . Chronic pain 06/05/2018  . Iron deficiency anemia 06/05/2018  . Diarrhea of presumed infectious origin 04/21/2018  . Sepsis ([place]) 03/11/2018  . Paronychia of great toe of left foot 11/24/2017  . Hypertension 11/24/2017  . Pelvic mass in female 04/05/2017  . Tendonitis 03/30/2016  . Left ankle sprain 03/30/2016  . Peroneal tendonitis 01/13/2016  . Hip pain, chronic 12/28/2015  . Pain in joint, ankle and foot  12/28/2015  . Constipation 12/27/2015  . 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 Noncompliance with diabetes treatment 06/08/2015  . Right hip pain 05/30/2014  . Vitamin D deficiency 05/30/2014  . Tachycardia 08/15/2013  . Diabetes mellitus type 2, uncontrolled ([place]) 08/15/2013  . 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  . Dyspnea 07/15/2013  . Morbid obesity ([place]) 06/09/2012  . Anxiety   . Headache, common migraine, intractable   . Hyperlipidemia   . OSA (obstructive sleep apnea) 02/05/2012    Past Surgical History:  Procedure Laterality Date  . DILATION AND CURETTAGE OF UTERUS  2011  . gastric bypass    . OVARIAN CYST REMOVAL    . ROTATOR CUFF REPAIR     right    Allergies Amoxicillin, Avocado, Clarithromycin, Honey, Other, Sulfa drugs cross reactors, Watermelon concentrate, Metoprolol, Adhesive [tape], Levemir [insulin detemir], and Victoza [liraglutide]  Social History Social History   Tobacco Use  . Smoking status: Former Smoker    Packs/day: 1.00    Years: 4.00    Pack years: 4.00    Types: Cigarettes    Quit date: 02/05/1996    Years since quitting: 23.7  . Smokeless tobacco: Never Used  Substance Use Topics  . Alcohol use: No  . Drug use: No    Review of Systems Unknown, positive for altered mental status  All systems negative/normal/unremarkable except as stated in the HPI  ____________________________________________   PHYSICAL EXAM:  VITAL SIGNS: ED Triage Vitals  Enc Vitals Group     BP  Pulse      Resp      Temp      Temp src      SpO2      Weight      Height      Head Circumference      Peak Flow      Pain Score      Pain Loc      Pain Edu?      Excl. in [place]?     Constitutional: Alert but disoriented, mild to moderate distress Eyes: Conjunctivae are normal. Normal extraocular movements. ENT      Head: Normocephalic and atraumatic.      Nose: No congestion/rhinnorhea.      Mouth/Throat: Mucous membranes are  moist.      Neck: No stridor. Cardiovascular: Rapid rate, regular rhythm. No murmurs, rubs, or gallops. Respiratory: Tachypnea with clear breath sounds Gastrointestinal: Soft and nontender. Normal bowel sounds Musculoskeletal: Extremities are cold to the touch, particular her legs with mottled skin.  Good range of motion. Neurologic: Confused, no gross focal neurologic deficits are appreciated.  Patient does not follow commands well Skin: Pallor with cyanosis and mottled appearing skin Psychiatric: Altered mental status, distressed appearing ____________________________________________  EKG: Interpreted by me.  Sinus tachycardia with a rate of 122 bpm, LVH, left axis deviation, normal QT  ____________________________________________  ED COURSE:  As part of my medical decision making, I reviewed the following data within the [place] History obtained from family if available, nursing notes, old chart and ekg, as well as notes from prior ED visits. Patient presented for altered mental status with reported COVID-19 positivity, we will assess with labs and imaging as indicated at this time. Clinical Course as of Oct 12 1045  Mon Oct 13, 2019  1036 Labs indicate elevated anion gap metabolic acidosis consistent with DKA.  Possible she is septic but likely just reactive.  Troponin is likely also demand related.   [JW]    Clinical Course User Index [JW] Earleen Newport, MD   Procedures  Maria Bentley was evaluated in Emergency Department on 10/13/2019 for the symptoms described in the history of present illness. She was evaluated in the context of the global COVID-19 pandemic, which necessitated consideration that the patient might be at risk for infection with the SARS-CoV-2 virus that causes COVID-19. Institutional protocols and algorithms that pertain to the evaluation of patients at risk for COVID-19 are in a state of rapid change based on information released  by regulatory bodies including the CDC and federal and state organizations. These policies and algorithms were followed during the patient's care in the ED.  ____________________________________________   LABS (pertinent positives/negatives)  Labs Reviewed  CBC WITH DIFFERENTIAL/PLATELET - Abnormal; Notable for the following components:      Result Value   WBC 23.8 (*)    RBC 5.15 (*)    Hemoglobin 15.3 (*)    HCT 52.2 (*)    MCV 101.4 (*)    MCHC 29.3 (*)    Platelets 761 (*)    nRBC 1.5 (*)    Neutro Abs 20.0 (*)    Monocytes Absolute 1.4 (*)    Abs Immature Granulocytes 0.78 (*)    All other components within normal limits  FIBRIN DERIVATIVES D-DIMER ([facility] ONLY) - Abnormal; Notable for the following components:   Fibrin derivatives D-dimer [facility]) 1,897.61 (*)    All other components within normal limits  FIBRINOGEN - Abnormal; Notable for the following components:  Fibrinogen 664 (*)    All other components within normal limits  URINALYSIS, COMPLETE (UACMP) WITH MICROSCOPIC - Abnormal; Notable for the following components:   Color, Urine YELLOW (*)    APPearance HAZY (*)    Glucose, UA >=500 (*)    Hgb urine dipstick MODERATE (*)    Ketones, ur 80 (*)    Protein, ur 100 (*)    All other components within normal limits  BLOOD GAS, VENOUS - Abnormal; Notable for the following components:   pH, Ven 7.03 (*)    Bicarbonate 11.6 (*)    Acid-base deficit 18.8 (*)    All other components within normal limits  CULTURE, BLOOD (ROUTINE X 2)  CULTURE, BLOOD (ROUTINE X 2)  URINE CULTURE  LACTIC ACID, PLASMA  ETHANOL  LACTIC ACID, PLASMA  COMPREHENSIVE METABOLIC PANEL  PROCALCITONIN  LACTATE DEHYDROGENASE  FERRITIN  TRIGLYCERIDES  C-REACTIVE PROTEIN  URINE DRUG SCREEN, QUALITATIVE ([facility] ONLY)  POC URINE PREG, ED  TROPONIN I (HIGH SENSITIVITY)   CRITICAL CARE Performed by: Laurence Aly   Total critical care time: 30 minutes  Critical care time was exclusive  of separately billable procedures and treating other patients.  Critical care was necessary to treat or prevent imminent or life-threatening deterioration.  Critical care was time spent personally by me on the following activities: development of treatment plan with patient and/or surrogate as well as nursing, discussions with consultants, evaluation of patient's response to treatment, examination of patient, obtaining history from patient or surrogate, ordering and performing treatments and interventions, ordering and review of laboratory studies, ordering and review of radiographic studies, pulse oximetry and re-evaluation of patient's condition.  RADIOLOGY Images were viewed by me  Chest x-ray IMPRESSION:  Limited, shallow inspiration radiograph.   Ill-defined opacities within the bilateral lungs may reflect edema  and/or infection.  ____________________________________________   DIFFERENTIAL DIAGNOSIS   Sepsis, hypothermia, DKA, dehydration, electrolyte abnormality, COVID-19, pneumonia  FINAL ASSESSMENT AND PLAN  Altered mental status, COVID-19, diabetic ketoacidosis, AKI   Plan: The patient had presented for altered mental status and appeared to be gravely ill. Patient's labs did reveal marked leukocytosis and acidemia concerning for sepsis.  She was given IV fluids and started on broad-spectrum antibiotics for patients who are pen allergic.  Patient's imaging revealed ill-defined opacities within the lungs bilaterally, left greater than right.  Her labs did indicate diabetic ketoacidosis and ordered an insulin drip.  She also had acute kidney injury.  White count is likely reactive.  Because she is Covid positive she will need to be transferred to [facility], either [facility] or the [facility].   Laurence Aly, MD    Note: This note was generated in part or whole with voice recognition software. Voice recognition is usually quite accurate but there are  transcription errors that can and very often do occur. I apologize for any typographical errors that were not detected and corrected.     Earleen Newport, MD 10/13/19 1024    Earleen Newport, MD 10/13/19 [phone]

## 2019-10-13 NOTE — Procedures (Signed)
Central Venous Catheter Insertion Procedure Note Addylynn Mukes [id number] January 12, 1971  Procedure: Insertion of Central Venous Catheter Indications: Assessment of intravascular volume  Procedure Details Consent: Risks of procedure as well as the alternatives and risks of each were explained to the (patient/caregiver).  Consent for procedure obtained. Time Out: Verified patient identification, verified procedure, site/side was marked, verified correct patient position, special equipment/implants available, medications/allergies/relevent history reviewed, required imaging and test results available.  Performed  Maximum sterile technique was used including antiseptics, cap, gloves, gown, hand hygiene, mask and sheet. Skin prep: Chlorhexidine; local anesthetic administered A antimicrobial bonded/coated triple lumen catheter was placed in the left subclavian vein using the Seldinger technique.  Ultrasound was used to verify the patency of the vein and for real time needle guidance.  Evaluation Blood flow good Complications: No apparent complications Patient did tolerate procedure well. Chest X-ray ordered to verify placement.  CXR: pending.  Simonne Maffucci 10/13/2019, 6:46 PM

## 2019-10-13 NOTE — ED Notes (Signed)
Pt oxygen saturation in 80's at this time. Pt O2 increased to 4L [place] at this time

## 2019-10-13 NOTE — Consult Note (Signed)
Cardiology Consultation:   Patient ID: Maria Bentley MRN: [id number]; DOB: March 19, 1971  Admit date: 10/13/2019 Date of Consult: 10/13/2019  Primary Care Provider: [name], MD Primary Cardiologist: Fletcher Anon Primary Electrophysiologist:  None    Patient Profile:   Maria Bentley is a 48 y.o. female with a hx of inappropriate sinus tachycardia who is being seen today for the evaluation of elevated troponin at the request of Dr. Jimmye Norman.  History of Present Illness:   Maria Bentley is a 48 year old female who is well-known to me.  She has known history of inappropriate sinus tachycardia.  Previous echocardiogram showed normal LV systolic function.  She has multiple other comorbidities including anemia, diabetes, essential hypertension, hyperlipidemia, morbid obesity and previous gastric bypass surgery.  The patient recently tested positive for Covid and was brought by EMS due to altered mental status.  She was found to be hyperglycemic.  I did not interview the patient given positive Covid status and mental status change.  I discussed the case with Dr. Jimmye Norman in details and reviewed her lab results and testing.  She was found to have a troponin of 600 but there is no report of ischemic symptoms.  EKG does not show any acute changes.  Heart Pathway Score:     Past Medical History:  Diagnosis Date   Anemia    Anxiety    Depression 2010   Diabetes mellitus    Endometriosis    Headache, common migraine, intractable    History of ovarian cyst    left sided, caused by her endometriosis (possibly an endometrioma), in her 42s.   Hyperlipidemia    Hypertension    Kidney stones    left   Morbid obesity ([place])    s/p gastric bypass   Personal hx of gastric bypass 2010   Rosacea    Vertigo     Past Surgical History:  Procedure Laterality Date   DILATION AND CURETTAGE OF UTERUS  2011   gastric bypass     OVARIAN CYST REMOVAL     ROTATOR CUFF REPAIR       right     Home Medications:  Prior to Admission medications   Medication Sig Start Date End Date Taking? Authorizing Provider  acetaminophen (TYLENOL) 325 MG tablet Take by mouth.    [provider]  albuterol (VENTOLIN HFA) 108 (90 Base) MCG/ACT inhaler Inhale 2 puffs into the lungs every 6 (six) hours as needed for wheezing or shortness of breath. 10/02/19   Terald Sleeper, PA-C  atorvastatin (LIPITOR) 20 MG tablet TAKE 1 TABLET BY MOUTH EVERY DAY 05/16/19   [name], MD  B-D INS SYR MICROFINE 1CC/28G 28G X 1/2" 1 ML MISC USE 1 SYRINGE DAILY AFTER SUPPER 01/10/16   [name], MD  benzonatate (TESSALON) 100 MG capsule Take 1-2 capsules (100-200 mg total) by mouth 3 (three) times daily as needed for cough. 10/02/19   Terald Sleeper, PA-C  blood glucose meter kit and supplies KIT Dispense based on patient and insurance preference. Use up to four times daily as directed. (FOR ICD-E11.21) 08/22/16   [name], MD  buPROPion (WELLBUTRIN XL) 150 MG 24 hr tablet TAKE 3 TABLETS BY MOUTH IN THE MORNING 05/12/18   [provider]  Continuous Blood Gluc Receiver (FREESTYLE LIBRE 14 DAY READER) DEVI 1 applicator by Does not apply route 4 (four) times daily. 11/24/17   [name], MD  Continuous Blood Gluc Sensor (FREESTYLE LIBRE 14  DAY SENSOR) MISC Inject 1 application into the skin 4 (four) times daily as needed (uncontrolled diabetes). 09/19/19   [name], MD  cyanocobalamin (,VITAMIN B-12,) 1000 MCG/ML injection INJECT 1 ML (1,000 MCG TOTAL) INTO THE MUSCLE ONCE A WEEK. FOR 4 WEEKS, THEN MONTHLY THEREAFTER 02/18/19   [name], MD  cyclobenzaprine (FLEXERIL) 10 MG tablet TAKE 1/2 TO 1 UP TO THREE TIMES DAILY AS NEEDED. DROWSINESS PRECAUTIONS 05/21/18   [provider]  diltiazem (CARDIZEM CD) 180 MG 24 hr capsule TAKE 1 CAPSULE BY MOUTH EVERY DAY 08/29/19   Wellington Hampshire, MD  EPINEPHrine 0.3 mg/0.3 mL IJ SOAJ injection  06/25/10   [provider]  escitalopram (LEXAPRO) 20 MG tablet Take 20 mg by mouth daily.  02/27/17   [provider]  fluticasone (FLONASE) 50 MCG/ACT nasal spray SPRAY 2 SPRAYS INTO EACH NOSTRIL EVERY DAY 09/29/19   [name], MD  HYDROcodone-acetaminophen (NORCO) 10-325 MG tablet Take 1 tablet by mouth every 6 (six) hours as needed for up to 7 days for severe pain (headache). 10/06/19 10/13/19  [name], MD  INS SYRINGE/NEEDLE 1CC/28G (B-D INS SYR MICROFINE 1CC/28G) 28G X 1/2" 1 ML MISC USE 1 SYRINGE DAILY AFTER SUPPER 08/01/18   [name], MD  insulin aspart (NOVOLOG FLEXPEN) 100 UNIT/ML FlexPen Inject 15 units + sliding scale under the skin with meals. Add 3-5 units for every blood sugar 50 mg/dL over 150. Max dose of 30 units with meals. 08/19/18   [name], MD  Insulin Glargine (BASAGLAR KWIKPEN) 100 UNIT/ML SOPN INJECT 50 UNITS TOTAL INTO THE SKIN DAILY. 09/15/19   [name], MD  Insulin Pen Needle (BD PEN NEEDLE NANO U/F) 32G X 4 MM MISC Please specify directions, refills and quantity 08/22/18   [name], MD  Insulin Syringe-Needle U-100 (B-D INS SYR ULTRAFINE 1CC/31G) 31G X 5/16" 1 ML MISC 1 Syringe by Does not apply route daily. 10/13/13   [name], MD  lamoTRIgine (LAMICTAL) 150 MG tablet Take 150 mg by mouth daily.    [provider]  Lancet Device MISC Test blood sugar two times daily 07/29/12   [name], MD  LORazepam (ATIVAN) 0.5 MG tablet Take 0.5-1 mg by mouth daily as needed.     [provider]  meclizine (ANTIVERT) 25 MG tablet Take 1 tablet (25 mg total) by mouth 3 (three) times daily as needed for dizziness. 06/16/15   [name], MD  metFORMIN (GLUCOPHAGE-XR) 500 MG 24 hr tablet TAKE 4 TABLETS BY MOUTH EVERY DAY WITH BREAKFAST 08/14/19   [name], MD  NURTEC 75 MG TBDP Take 1 tablet by mouth daily as needed. 09/19/19   [provider]  nystatin (MYCOSTATIN/NYSTOP) powder APPLY 1 APPLICATION TO AFFECTED  AREA TWICE A DAY Patient taking differently: as needed.  03/12/19   [name], MD  OnabotulinumtoxinA (BOTOX IJ) Inject as directed every 3 (three) months. For migraine    [provider]  ONETOUCH VERIO test strip TEST BLOOD SUGAR 5 TIMES DAILY 09/03/18   [name], MD  oxyCODONE-acetaminophen (PERCOCET) 10-325 MG tablet Take 1 tablet by mouth every 6 (six) hours as needed for pain.    [provider]  predniSONE (DELTASONE) 10 MG tablet Take 5 tablets (50 mg total) by mouth daily. 10/05/19   Triplett, Cari B, FNP  SYRINGE-NEEDLE, DISP, 3 ML (BD INTEGRA SYRINGE) 25G X 1" 3 ML MISC FOR  USE WITH B12 INJECTIONS WEEKLY/MONTHLY 08/01/18   [name], MD  Vitamin D, Ergocalciferol, (DRISDOL) 1.25 MG (50000 UT) CAPS capsule TAKE 1 CAPSULE BY MOUTH ONCE A WEEK 05/07/19   [name], MD    Inpatient Medications: Scheduled Meds:  Continuous Infusions:  insulin 3.9 mL/hr at 10/13/19 1415   metronidazole     vancomycin 2,000 mg (10/13/19 1310)   PRN Meds:   Allergies:    Allergies  Allergen Reactions   Amoxicillin Hives and Rash   Avocado Swelling   Clarithromycin Hives and Rash   Honey Anaphylaxis   Other Hives and Swelling    All melon   Sulfa Drugs Cross Reactors Anaphylaxis   Watermelon Concentrate Hives and Swelling    Oral swelling, ears swell shut   Metoprolol Other (See Comments)    Hypotension   Adhesive [Tape] Itching    Skin breakdown   Levemir [Insulin Detemir] Rash    Red/swollen injection site reactions with residual lumps   Victoza [Liraglutide] Nausea Only    Social History:   Social History   Socioeconomic History   Marital status: Married    Spouse name: Not on file   Number of children: Not on file   Years of education: Not on file   Highest education level: Not on file  Occupational History   Not on file  Social Needs   Financial resource strain: Not on file   Food insecurity    Worry: Not on file     Inability: Not on file   Transportation needs    Medical: Not on file    Non-medical: Not on file  Tobacco Use   Smoking status: Former Smoker    Packs/day: 1.00    Years: 4.00    Pack years: 4.00    Types: Cigarettes    Quit date: 02/05/1996    Years since quitting: 23.7   Smokeless tobacco: Never Used  Substance and Sexual Activity   Alcohol use: No   Drug use: No   Sexual activity: Yes    Birth control/protection: None  Lifestyle   Physical activity    Days per week: Not on file    Minutes per session: Not on file   Stress: Not on file  Relationships   Social connections    Talks on phone: Not on file    Gets together: Not on file    Attends religious service: Not on file    Active member of club or organization: Not on file    Attends meetings of clubs or organizations: Not on file    Relationship status: Not on file   Intimate partner violence    Fear of current or ex partner: Not on file    Emotionally abused: Not on file    Physically abused: Not on file    Forced sexual activity: Not on file  Other Topics Concern   Not on file  Social History Narrative   Not on file    Family History:    Family History  Problem Relation Age of Onset   Diabetes Mother    Hypertension Mother    Hypertension Father    Diabetes Father    Depression Other        strong hx of   Breast cancer Paternal Grandmother 64   Cancer Neg Hx      ROS:  Please see the history of present illness.   All other ROS reviewed and negative.     Physical  Exam/Data:   Vitals:   10/13/19 1309 10/13/19 1314 10/13/19 1330 10/13/19 1400  BP: 120/72  130/68 100/72  Pulse:   (!) 132   Resp:   (!) 24 (!) 29  Temp:      TempSrc:      SpO2:  (!) 82% 97%     Intake/Output Summary (Last 24 hours) at 10/13/2019 1457 Last data filed at 10/13/2019 1415 Gross per 24 hour  Intake 1111.27 ml  Output --  Net 1111.27 ml   Last 3 Weights 10/06/2019 10/05/2019 09/19/2019   Weight (lbs) 220 lb 220 lb 220 lb 1.9 oz  Weight (kg) 99.791 kg 99.791 kg 99.846 kg     Physical examination was not performed due to positive Covid and to minimize exposure.   EKG:  The EKG was personally reviewed and demonstrates: Normal sinus rhythm with LVH and poor R wave progression in the anterior leads.   Relevant CV Studies:   Laboratory Data:  High Sensitivity Troponin:   Recent Labs  Lab 10/05/19 1538 10/05/19 1812 10/13/19 0904  TROPONINIHS 19* 13 629*     Chemistry Recent Labs  Lab 10/13/19 0904  NA 152*  K 4.5  CL 114*  CO2 11*  GLUCOSE 709*  BUN 53*  CREATININE 2.44*  CALCIUM 9.5  GFRNONAA 22*  GFRAA 26*  ANIONGAP 27*    Recent Labs  Lab 10/13/19 0904  PROT 8.0  ALBUMIN 3.3*  AST 14*  ALT 17  ALKPHOS 115  BILITOT 2.7*   Hematology Recent Labs  Lab 10/13/19 0904  WBC 23.8*  RBC 5.15*  HGB 15.3*  HCT 52.2*  MCV 101.4*  MCH 29.7  MCHC 29.3*  RDW 14.6  PLT 761*   BNPNo results for input(s): BNP, PROBNP in the last 168 hours.  DDimer No results for input(s): DDIMER in the last 168 hours.   Radiology/Studies:  Ct Head Wo Contrast  Result Date: 10/13/2019 CLINICAL DATA:  Altered mental status and leukocytosis EXAM: CT HEAD WITHOUT CONTRAST TECHNIQUE: Contiguous axial images were obtained from the base of the skull through the vertex without intravenous contrast. COMPARISON:  November 22, 2009 FINDINGS: Brain: Ventricles are normal in size and configuration. There is no intracranial mass, hemorrhage, extra-axial fluid collection, or midline shift. The brain parenchyma appears unremarkable. No acute infarct is evident. Vascular: There is no hyperdense vessel. No evident vascular calcifications. Skull: The bony calvarium appears intact. Sinuses/Orbits: Paranasal sinuses are clear. Orbits appear symmetric bilaterally. Other: Mastoid air cells are clear. IMPRESSION: Study within normal limits.3 Electronically Signed   By: Lowella Grip III  M.D.   On: 10/13/2019 14:40   Dg Chest Port 1 View  Result Date: 10/13/2019 CLINICAL DATA:  Dyspnea. Additional history provided: Patient presents via EMS from home for altered mental status. EXAM: PORTABLE CHEST 1 VIEW COMPARISON:  Chest radiograph 10/05/2019 FINDINGS: Limited assessment due to patient rotation and shallow inspiration. Cardiomediastinal silhouette appears unchanged with heart size normal. Unchanged elevation of the right hemidiaphragm. Bilateral ill-defined opacities may reflect edema and/or infection. No evidence of pleural effusion or pneumothorax. No acute bony abnormality. IMPRESSION: Limited, shallow inspiration radiograph. Ill-defined opacities within the bilateral lungs may reflect edema and/or infection. Electronically Signed   By: Kellie Simmering DO   On: 10/13/2019 09:55    Assessment and Plan:   1. Elevated troponin likely due to supply demand ischemia in the setting of hyperglycemia and COVID-19 infection.  Myocarditis is also a possibility but not supported by EKG.  EKG does  not show ischemic changes.  Continue to cycle troponin.  If there is significant change, consider full dose anticoagulation for 48 hours.  Otherwise, continue supportive care for underlying medical problems.  No plans for ischemic work-up at the present time.  Outpatient stress testing can be considered depending on her clinical status.  Consider obtaining an echocardiogram.  2.  COVID-19 infection: Treatment per critical care.  I think the patient is appropriate for admission to [facility] as I do not think she will require urgent cardiac catheterization during current admission.   For questions or updates, please contact [place] Please consult [email or web address] for contact info under     Signed, Kathlyn Sacramento, MD  10/13/2019 2:57 PM

## 2019-10-13 NOTE — Procedures (Signed)
Intubation Procedure Note Maria Bentley [id number] Apr 13, 1971  Procedure: Intubation Indications: Respiratory insufficiency  Procedure Details Consent: Risks of procedure as well as the alternatives and risks of each were explained to the (patient/caregiver).  Consent for procedure obtained. Time Out: Verified patient identification, verified procedure, site/side was marked, verified correct patient position, special equipment/implants available, medications/allergies/relevent history reviewed, required imaging and test results available.  Performed  Drugs Etomidate 49m IV, versed 243mIV, fentanyl 5053mIV, Rocuronium 100m16m DL x 1 with MAC 3 blade Grade 1 view 7.5 ET tube passed through cords under direct visualization Placement confirmed with bilateral breath sounds, positive EtCO2 change and smoke in tube   Evaluation Hemodynamic Status: BP stable throughout; O2 sats: transiently fell during during procedure Patient's Current Condition: stable Complications: No apparent complications Patient did tolerate procedure well. Chest X-ray ordered to verify placement.  CXR: pending.   DougSimonne Maffucci2/2020

## 2019-10-13 NOTE — ED Provider Notes (Signed)
Repeat EKG interpreted by me, sinus tachycardia with a rate of 133 bpm, left anterior fascicular block, low voltage, normal QT   Earleen Newport, MD 10/13/19 1504

## 2019-10-13 NOTE — Progress Notes (Signed)
Pharmacy Note - Remdesivir Dosing  O:  ALT: 17 CXR: Ill-defined opacities within the bilateral lungs may reflect edema and/or infection. Requiring supplemental O2: 100% FiO2   A/P:  Patient meets criteria for remdesivir.  Begin remdesivir 200 mg IV x 1, followed by 100 mg IV daily x 4 days  Monitor ALT, clinical progress  Peggyann Juba, PharmD, [place] [phone] 10/13/2019 5:38 PM

## 2019-10-14 ENCOUNTER — Inpatient Hospital Stay (HOSPITAL_COMMUNITY): Payer: BC Managed Care – PPO

## 2019-10-14 DIAGNOSIS — J1289 Other viral pneumonia: Secondary | ICD-10-CM

## 2019-10-14 DIAGNOSIS — R7989 Other specified abnormal findings of blood chemistry: Secondary | ICD-10-CM

## 2019-10-14 DIAGNOSIS — E870 Hyperosmolality and hypernatremia: Secondary | ICD-10-CM

## 2019-10-14 LAB — COMPREHENSIVE METABOLIC PANEL
ALT: 12 U/L (ref 0–44)
AST: 11 U/L — ABNORMAL LOW (ref 15–41)
Albumin: 2.2 g/dL — ABNORMAL LOW (ref 3.5–5.0)
Alkaline Phosphatase: 90 U/L (ref 38–126)
Anion gap: 9 (ref 5–15)
BUN: 52 mg/dL — ABNORMAL HIGH (ref 6–20)
CO2: 18 mmol/L — ABNORMAL LOW (ref 22–32)
Calcium: 8.1 mg/dL — ABNORMAL LOW (ref 8.9–10.3)
Chloride: 127 mmol/L — ABNORMAL HIGH (ref 98–111)
Creatinine, Ser: 2.17 mg/dL — ABNORMAL HIGH (ref 0.44–1.00)
GFR calc Af Amer: 30 mL/min — ABNORMAL LOW (ref >60)
GFR calc non Af Amer: 26 mL/min — ABNORMAL LOW (ref >60)
Glucose, Bld: 202 mg/dL — ABNORMAL HIGH (ref 70–99)
Potassium: 3.7 mmol/L (ref 3.5–5.1)
Sodium: 154 mmol/L — ABNORMAL HIGH (ref 135–145)
Total Bilirubin: 0.4 mg/dL (ref 0.3–1.2)
Total Protein: 5.6 g/dL — ABNORMAL LOW (ref 6.5–8.1)

## 2019-10-14 LAB — CBC WITH DIFFERENTIAL/PLATELET
Abs Immature Granulocytes: 0.1 10*3/uL — ABNORMAL HIGH (ref 0.00–0.07)
Basophils Absolute: 0 10*3/uL (ref 0.0–0.1)
Basophils Relative: 0 %
Eosinophils Absolute: 0 10*3/uL (ref 0.0–0.5)
Eosinophils Relative: 0 %
HCT: 39.7 % (ref 36.0–46.0)
Hemoglobin: 12.1 g/dL (ref 12.0–15.0)
Immature Granulocytes: 1 %
Lymphocytes Relative: 9 %
Lymphs Abs: 1.1 10*3/uL (ref 0.7–4.0)
MCH: 29.7 pg (ref 26.0–34.0)
MCHC: 30.5 g/dL (ref 30.0–36.0)
MCV: 97.3 fL (ref 80.0–100.0)
Monocytes Absolute: 0.4 10*3/uL (ref 0.1–1.0)
Monocytes Relative: 4 %
Neutro Abs: 10.3 10*3/uL — ABNORMAL HIGH (ref 1.7–7.7)
Neutrophils Relative %: 86 %
Platelets: 485 10*3/uL — ABNORMAL HIGH (ref 150–400)
RBC: 4.08 MIL/uL (ref 3.87–5.11)
RDW: 14.6 % (ref 11.5–15.5)
WBC: 11.9 10*3/uL — ABNORMAL HIGH (ref 4.0–10.5)
nRBC: 0.4 % — ABNORMAL HIGH (ref 0.0–0.2)

## 2019-10-14 LAB — BASIC METABOLIC PANEL
Anion gap: 11 (ref 5–15)
BUN: 52 mg/dL — ABNORMAL HIGH (ref 6–20)
CO2: 19 mmol/L — ABNORMAL LOW (ref 22–32)
Calcium: 8.3 mg/dL — ABNORMAL LOW (ref 8.9–10.3)
Chloride: 127 mmol/L — ABNORMAL HIGH (ref 98–111)
Creatinine, Ser: 2.03 mg/dL — ABNORMAL HIGH (ref 0.44–1.00)
GFR calc Af Amer: 33 mL/min — ABNORMAL LOW (ref >60)
GFR calc non Af Amer: 28 mL/min — ABNORMAL LOW (ref >60)
Glucose, Bld: 219 mg/dL — ABNORMAL HIGH (ref 70–99)
Potassium: 4.3 mmol/L (ref 3.5–5.1)
Sodium: 157 mmol/L — ABNORMAL HIGH (ref 135–145)

## 2019-10-14 LAB — POCT I-STAT 7, (LYTES, BLD GAS, ICA,H+H)
Acid-base deficit: 6 mmol/L — ABNORMAL HIGH (ref 0.0–2.0)
Acid-base deficit: 7 mmol/L — ABNORMAL HIGH (ref 0.0–2.0)
Bicarbonate: 18.8 mmol/L — ABNORMAL LOW (ref 20.0–28.0)
Bicarbonate: 17.8 mmol/L — ABNORMAL LOW (ref 20.0–28.0)
Calcium, Ion: 1.25 mmol/L (ref 1.15–1.40)
Calcium, Ion: 1.26 mmol/L (ref 1.15–1.40)
HCT: 36 % (ref 36.0–46.0)
HCT: 34 % — ABNORMAL LOW (ref 36.0–46.0)
Hemoglobin: 12.2 g/dL (ref 12.0–15.0)
Hemoglobin: 11.6 g/dL — ABNORMAL LOW (ref 12.0–15.0)
O2 Saturation: 96 %
O2 Saturation: 91 %
Patient temperature: 37.1
Patient temperature: 37.6
Potassium: 4 mmol/L (ref 3.5–5.1)
Potassium: 3.7 mmol/L (ref 3.5–5.1)
Sodium: 158 mmol/L — ABNORMAL HIGH (ref 135–145)
Sodium: 157 mmol/L — ABNORMAL HIGH (ref 135–145)
TCO2: 20 mmol/L — ABNORMAL LOW (ref 22–32)
TCO2: 19 mmol/L — ABNORMAL LOW (ref 22–32)
pCO2 arterial: 33.6 mmHg (ref 32.0–48.0)
pCO2 arterial: 33.9 mmHg (ref 32.0–48.0)
pH, Arterial: 7.356 (ref 7.350–7.450)
pH, Arterial: 7.332 — ABNORMAL LOW (ref 7.350–7.450)
pO2, Arterial: 83 mmHg (ref 83.0–108.0)
pO2, Arterial: 68 mmHg — ABNORMAL LOW (ref 83.0–108.0)

## 2019-10-14 LAB — GLUCOSE, CAPILLARY
Glucose-Capillary: 109 mg/dL — ABNORMAL HIGH (ref 70–99)
Glucose-Capillary: 130 mg/dL — ABNORMAL HIGH (ref 70–99)
Glucose-Capillary: 134 mg/dL — ABNORMAL HIGH (ref 70–99)
Glucose-Capillary: 138 mg/dL — ABNORMAL HIGH (ref 70–99)
Glucose-Capillary: 146 mg/dL — ABNORMAL HIGH (ref 70–99)
Glucose-Capillary: 151 mg/dL — ABNORMAL HIGH (ref 70–99)
Glucose-Capillary: 153 mg/dL — ABNORMAL HIGH (ref 70–99)
Glucose-Capillary: 155 mg/dL — ABNORMAL HIGH (ref 70–99)
Glucose-Capillary: 167 mg/dL — ABNORMAL HIGH (ref 70–99)
Glucose-Capillary: 169 mg/dL — ABNORMAL HIGH (ref 70–99)
Glucose-Capillary: 169 mg/dL — ABNORMAL HIGH (ref 70–99)
Glucose-Capillary: 169 mg/dL — ABNORMAL HIGH (ref 70–99)
Glucose-Capillary: 174 mg/dL — ABNORMAL HIGH (ref 70–99)
Glucose-Capillary: 185 mg/dL — ABNORMAL HIGH (ref 70–99)
Glucose-Capillary: 186 mg/dL — ABNORMAL HIGH (ref 70–99)
Glucose-Capillary: 186 mg/dL — ABNORMAL HIGH (ref 70–99)
Glucose-Capillary: 186 mg/dL — ABNORMAL HIGH (ref 70–99)
Glucose-Capillary: 187 mg/dL — ABNORMAL HIGH (ref 70–99)
Glucose-Capillary: 194 mg/dL — ABNORMAL HIGH (ref 70–99)
Glucose-Capillary: 203 mg/dL — ABNORMAL HIGH (ref 70–99)
Glucose-Capillary: 216 mg/dL — ABNORMAL HIGH (ref 70–99)

## 2019-10-14 LAB — MAGNESIUM
Magnesium: 2 mg/dL (ref 1.7–2.4)
Magnesium: 1.8 mg/dL (ref 1.7–2.4)

## 2019-10-14 LAB — URINE CULTURE

## 2019-10-14 LAB — PHOSPHORUS
Phosphorus: 1.2 mg/dL — ABNORMAL LOW (ref 2.5–4.6)
Phosphorus: 2.5 mg/dL (ref 2.5–4.6)

## 2019-10-14 LAB — FERRITIN: Ferritin: 82 ng/mL (ref 11–307)

## 2019-10-14 LAB — C-REACTIVE PROTEIN: CRP: 9.7 mg/dL — ABNORMAL HIGH (ref <1.0)

## 2019-10-14 LAB — TRIGLYCERIDES: Triglycerides: 288 mg/dL — ABNORMAL HIGH (ref <150)

## 2019-10-14 LAB — MRSA PCR SCREENING: MRSA by PCR: NEGATIVE

## 2019-10-14 LAB — D-DIMER, QUANTITATIVE: D-Dimer, Quant: 2.4 ug/mL-FEU — ABNORMAL HIGH (ref 0.00–0.50)

## 2019-10-14 LAB — ECHOCARDIOGRAM LIMITED

## 2019-10-14 MED ORDER — PROPOFOL 1000 MG/100ML IV EMUL
5.0000 ug/kg/min | INTRAVENOUS | Status: DC
  Administered 2019-10-14: 5 ug/kg/min via INTRAVENOUS
  Administered 2019-10-14 – 2019-10-15 (×3): 20 ug/kg/min via INTRAVENOUS
  Administered 2019-10-15: 10 ug/kg/min via INTRAVENOUS
  Filled 2019-10-14 (×6): qty 100

## 2019-10-14 MED ORDER — VITAL HIGH PROTEIN PO LIQD
1000.0000 mL | ORAL | Status: DC
  Administered 2019-10-14: 1000 mL

## 2019-10-14 MED ORDER — VITAL AF 1.2 CAL PO LIQD
1000.0000 mL | ORAL | Status: DC
  Administered 2019-10-14 – 2019-10-20 (×7): 1000 mL

## 2019-10-14 MED ORDER — PRO-STAT SUGAR FREE PO LIQD
30.0000 mL | Freq: Three times a day (TID) | ORAL | Status: DC
  Administered 2019-10-14 – 2019-10-16 (×8): 30 mL
  Filled 2019-10-14 (×9): qty 30

## 2019-10-14 MED ORDER — PRO-STAT SUGAR FREE PO LIQD
30.0000 mL | Freq: Two times a day (BID) | ORAL | Status: DC
  Administered 2019-10-14: 30 mL
  Filled 2019-10-14: qty 30

## 2019-10-14 MED ORDER — FREE WATER
200.0000 mL | Status: DC
  Administered 2019-10-14 – 2019-10-17 (×19): 200 mL

## 2019-10-14 MED ORDER — DEXAMETHASONE 1 MG/ML PO CONC
6.0000 mg | Freq: Every day | ORAL | Status: DC
  Administered 2019-10-14 – 2019-10-20 (×7): 6 mg
  Filled 2019-10-14 (×8): qty 6

## 2019-10-14 MED ORDER — SODIUM CHLORIDE 0.9 % IV BOLUS
1000.0000 mL | Freq: Once | INTRAVENOUS | Status: AC
  Administered 2019-10-14: 1000 mL via INTRAVENOUS

## 2019-10-14 MED ORDER — POTASSIUM PHOSPHATES 15 MMOLE/5ML IV SOLN
30.0000 mmol | Freq: Once | INTRAVENOUS | Status: AC
  Administered 2019-10-14: 30 mmol via INTRAVENOUS
  Filled 2019-10-14: qty 10

## 2019-10-14 NOTE — Progress Notes (Signed)
NAME:  Maria Bentley, MRN:  [id number], DOB:  08/13/1971, LOS: 1 ADMISSION DATE:  10/13/2019, CONSULTATION DATE: November 2 REFERRING MD: Dr. Jimmye Norman, CHIEF COMPLAINT: Confusion  Brief History   48 year old female with diabetes mellitus presented to the [facility] with DKA and COVID-19 pneumonia.  Transferred to [facility] and was intubated on arrival for inability to protect airway and hypoxemic respiratory failure.   Past Medical History  Diabetes mellitus Hypertension Hyperlipidemia Morbid obesity  Significant Hospital Events   November 2 admission  Consults:  PCCM  Procedures:  November 2 endotracheal tube November 2 left subclavian central venous line  Significant Diagnostic Tests:  11/2 CT head > NAICP  Micro Data:  11/2 blood >   Antimicrobials:  11/2 remdesivir >   Interim history/subjective:  Weaning PEEP/FiO2 but remains hypoxemic  Objective   Blood pressure 112/71, pulse (!) 102, temperature 99.3 F (37.4 C), temperature source Bladder, resp. rate (!) 5, last menstrual period 05/09/2016, SpO2 97 %.    Vent Mode: PRVC FiO2 (%):  [60 %-100 %] 60 % Set Rate:  [28 bmp] 28 bmp Vt Set:  [430 mL] 430 mL PEEP:  [10 cmH20] 10 cmH20 Plateau Pressure:  [24 cmH20-28 cmH20] 26 cmH20   Intake/Output Summary (Last 24 hours) at 10/14/2019 1034 Last data filed at 10/14/2019 0800 Gross per 24 hour  Intake 4663.68 ml  Output 390 ml  Net 4273.68 ml   There were no vitals filed for this visit.  Examination:  General:  In bed on vent HENT: NCAT ETT in place PULM: CTA B, vent supported breathing CV: RRR, no mgr GI: BS+, soft, nontender MSK: normal bulk and tone Derm: mottling Neuro: sedated on vent, drowsy, opens eyes to voice     Resolved Hospital Problem list     Assessment & Plan:  Severe acute respiratory failure with hypoxemia due to COVID-19 pneumonia: Continue mechanical ventilation per ARDS protocol Target  TVol 6-8cc/kgIBW Target Plateau Pressure < 30cm H20 Target driving pressure less than 15 cm of water Target PaO2 55-65: titrate PEEP/FiO2 per protocol As long as PaO2 to FiO2 ratio is less than 1:150 position in prone position for 16 hours a day Check CVP daily if CVL in place Target CVP less than 4, diurese as necessary Ventilator associated pneumonia prevention protocol 11/3 wean PEEP, FiO2, repeat ABG later today, may need to be placed in prone position based on ABG, check CXR Add decadron  Need for sedation/mechanical ventilation: Acute encepahlopathy due to DKA: slightly improved on 11/3 as opening eyes RASS target -2 Fentanyl infusion, prn versed  DKA: gap clsed 11/3 Maintain insulin drip for now Start tube feeding  Best practice:  Diet: tube feeding Pain/Anxiety/Delirium protocol (if indicated): as above VAP protocol (if indicated): yes DVT prophylaxis: lovenox GI prophylaxis: Pantoprazole for stress ulcer prophylaxis Glucose control: per DKA protocol Mobility: bed rest Code Status: ful Family Communication: per [facility] Disposition: remain in ICU  Labs   CBC: Recent Labs  Lab 10/13/19 0904 10/13/19 1824 10/14/19 0321 10/14/19 0509  WBC 23.8*  --   --  11.9*  NEUTROABS 20.0*  --   --  10.3*  HGB 15.3* 13.3 12.2 12.1  HCT 52.2* 39.0 36.0 39.7  MCV 101.4*  --   --  97.3  PLT 761*  --   --  485*    Basic Metabolic Panel: Recent Labs  Lab 10/13/19 0904 10/13/19 1534 10/13/19 1824 10/13/19 2340 10/14/19 0321 10/14/19 0509  NA 152*  156* 159* 157* 158* 154*  K 4.5 4.1 3.3* 4.3 4.0 3.7  CL 114* 120*  --  127*  --  127*  CO2 11* 20*  --  19*  --  18*  GLUCOSE 709* 396*  --  219*  --  202*  BUN 53* 56*  --  52*  --  52*  CREATININE 2.44* 2.38*  --  2.03*  --  2.17*  CALCIUM 9.5 9.0  --  8.3*  --  8.1*  MG  --   --   --   --   --  2.0  PHOS  --   --   --   --   --  1.2*   GFR: Estimated Creatinine Clearance: 36 mL/min (A) (by C-G formula based on SCr of  2.17 mg/dL (H)). Recent Labs  Lab 10/13/19 0904 10/14/19 0509  PROCALCITON 0.18  --   WBC 23.8* 11.9*  LATICACIDVEN 1.7  --     Liver Function Tests: Recent Labs  Lab 10/13/19 0904 10/14/19 0509  AST 14* 11*  ALT 17 12  ALKPHOS 115 90  BILITOT 2.7* 0.4  PROT 8.0 5.6*  ALBUMIN 3.3* 2.2*   No results for input(s): LIPASE, AMYLASE in the last 168 hours. No results for input(s): AMMONIA in the last 168 hours.  ABG    Component Value Date/Time   PHART 7.356 10/14/2019 0321   PCO2ART 33.6 10/14/2019 0321   PO2ART 83.0 10/14/2019 0321   HCO3 18.8 (L) 10/14/2019 0321   TCO2 20 (L) 10/14/2019 0321   ACIDBASEDEF 6.0 (H) 10/14/2019 0321   O2SAT 96.0 10/14/2019 0321     Coagulation Profile: No results for input(s): INR, PROTIME in the last 168 hours.  Cardiac Enzymes: No results for input(s): CKTOTAL, CKMB, CKMBINDEX, TROPONINI in the last 168 hours.  HbA1C: Hemoglobin A1C  Date/Time Value Ref Range Status  09/19/2019 04:45 PM 14.7 (A) 4.0 - 5.6 % Final  08/19/2018 03:33 PM 8.7 (A) 4.0 - 5.6 % Final   Hgb A1c MFr Bld  Date/Time Value Ref Range Status  04/19/2018 12:28 PM 11.5 (H) 4.6 - 6.5 % Final    Comment:    Glycemic Control Guidelines for People with Diabetes:Non Diabetic:  <6%Goal of Therapy: <7%Additional Action Suggested:  >8%   06/27/2016 09:34 AM 9.1 (H) 4.6 - 6.5 % Final    Comment:    Glycemic Control Guidelines for People with Diabetes:Non Diabetic:  <6%Goal of Therapy: <7%Additional Action Suggested:  >8%     CBG: Recent Labs  Lab 10/14/19 0430 10/14/19 0532 10/14/19 0635 10/14/19 0749 10/14/19 0941  GLUCAP 203* 169* 155* 169* 109*     Critical care time: 32 minutes    Roselie Awkward, MD [place] PCCM Pager: [phone] Cell: [phone] If no response, call [phone]

## 2019-10-14 NOTE — Progress Notes (Signed)
ABG results given to Dr. Lake Bells. Verbal order received to prone pt. RN notified

## 2019-10-14 NOTE — Progress Notes (Signed)
[facility] PCCM  I called her husband and updated him about her condition and spoke about the [place] PACT trial.  He voiced understanding, will discuss the trial enrollment again on next phone call.  Roselie Awkward, MD [place] PCCM Pager: [phone] Cell: [phone] If no response, call [phone]

## 2019-10-14 NOTE — Progress Notes (Signed)
  Echocardiogram 2D Echocardiogram has been performed.  Maria Bentley 10/14/2019, 3:42 PM

## 2019-10-14 NOTE — Progress Notes (Signed)
Pt proned at this time without complication.  RT will monitor.

## 2019-10-14 NOTE — Progress Notes (Signed)
Pt's husband Maria Bentley called and was given update on Maria Bentley' condition and plan of care today. All questions were answered, and he thanked the staff at [facility] for the care we are giving his wife.

## 2019-10-14 NOTE — Progress Notes (Signed)
Initial Nutrition Assessment RD working remotely.  DOCUMENTATION CODES:   Obesity unspecified  INTERVENTION:    Vital AF 1.2 at 40 ml/h (960 ml per day)   Pro-stat 30 ml TID   Provides 1452 kcal, 117 gm protein, 779 ml free water daily  NUTRITION DIAGNOSIS:   Increased nutrient needs related to acute illness(COVID-19) as evidenced by estimated needs.  GOAL:   Patient will meet greater than or equal to 90% of their needs  MONITOR:   Vent status, Labs, TF tolerance, Skin  REASON FOR ASSESSMENT:   Ventilator, Consult Enteral/tube feeding initiation and management  ASSESSMENT:   48 yo female admitted with DKA, COVID-19 PNA. PMH includes DM, HTN, HLD, morbid obesity.   Received MD Consult for TF initiation and management. May require proning.   Patient is currently intubated on ventilator support MV: 12.7 L/min Temp (24hrs), Avg:98.8 F (37.1 C), Min:98 F (36.7 C), Max:99.3 F (37.4 C)    Labs reviewed. Sodium 154, phosphorus 1.2 CBG's: [phone]  Medications reviewed and include decadron, vitamin C, zinc, insulin drip, potassium phosphate. IVF: D5 1/2 NS at 125 ml/h Receiving free water flushes via OGT: 200 ml every 4 hours.  Weight encounters reviewed. 5.6% weight loss within the past 6 months is insignificant for the time frame.  NUTRITION - FOCUSED PHYSICAL EXAM:  deferred  Diet Order:   Diet Order    None      EDUCATION NEEDS:   Not appropriate for education at this time  Skin:  Skin Assessment: Reviewed RN Assessment  Last BM:  PTA  Height:   Ht Readings from Last 1 Encounters:  10/06/19 5\' 4"  (1.626 m)    Weight:   Wt Readings from Last 1 Encounters:  10/06/19 99.8 kg    Ideal Body Weight:  54.5 kg  BMI:  37.7  Estimated Nutritional Needs:   Kcal:  1380  Protein:  >/= 110 gm  Fluid:  >/= 1.6 L    Molli Barrows, RD, LDN, [place] Pager [phone] After Hours Pager [phone]

## 2019-10-14 NOTE — Progress Notes (Signed)
Breakdown to left bottom lip

## 2019-10-14 NOTE — Progress Notes (Addendum)
PROGRESS NOTE  Maria Bentley RDE:[id number] DOB: May 20, 1971 DOA: 10/13/2019  PCP: [name], MD  Brief History/Interval Summary: 48 y.o. female with a history of poorly-controlled diabetes, recurrent rectal abscesses, morbid obesity s/p gastric bypass with vitamin D deficiency, depression and anxiety who presented to the ED at [facility] 11/2 after being found on the bedroom floor by her husband. They both have covid-19, and he has been sick, nearly bed-ridden all weekend so is not aware of her last couple days. He had heard her getting up to the kitchen and bathroom regularly, not sure if she was taking medications as usual or not, though she struggles with that at times. This morning he wasn't able to get her off the floor, so called EMS. On arrival to the ED she felt cool and appeared mottled with hypothermia, sinus tachycardia and hypoxia requiring 2L O2. UA without signs of infection, but with leukocytosis treated as sepsis with bilateral CXR infiltrates on hypoexpanded film with vancomycin, cefepime, flagyl. pH initially 7.03, gap 27, gave 2L warm saline, and starting insulin gtt. HR 120, no hypotension, RR 25/min, 98% on 2L O2. Remained confused, removing mask, and on admission to [facility] ICU was obtunded and intubated on arrival.  Reason for Visit: Diabetic ketoacidosis.  Acute respiratory failure with hypoxia due to COVID-19.  Consultants: Pulmonology  Procedures:   Antibiotics: Anti-infectives (From admission, onward)   Start     Dose/Rate Route Frequency Ordered Stop   10/14/19 1600  remdesivir 100 mg in sodium chloride 0.9 % 250 mL IVPB     100 mg 500 mL/hr over 30 Minutes Intravenous Every 24 hours 10/13/19 1737 10/18/19 1559   10/13/19 1830  remdesivir 200 mg in sodium chloride 0.9 % 250 mL IVPB     200 mg 500 mL/hr over 30 Minutes Intravenous Once 10/13/19 1737 10/13/19 1903      Subjective/Interval History: Patient is intubated and sedated.  Noted to be moving her  extremities.    Assessment/Plan:  Acute Hypoxic Resp. Failure/Pneumonia due to COVID-19  Vent Mode: PRVC FiO2 (%):  [60 %-100 %] 60 % Set Rate:  [28 bmp] 28 bmp Vt Set:  [430 mL] 430 mL PEEP:  [10 cmH20] 10 cmH20 Plateau Pressure:  [24 cmH20-28 cmH20] 26 cmH20     Component Value Date/Time   PHART 7.356 10/14/2019 0321   PCO2ART 33.6 10/14/2019 0321   PO2ART 83.0 10/14/2019 0321   HCO3 18.8 (L) 10/14/2019 0321   TCO2 20 (L) 10/14/2019 0321   ACIDBASEDEF 6.0 (H) 10/14/2019 0321   O2SAT 96.0 10/14/2019 0321    COVID-19 Labs  Recent Labs    10/13/19 0904 10/13/19 1800 10/14/19 0509 10/14/19 0510  DDIMER  --   --  2.40*  --   FERRITIN 120  --   --  82  LDH 328*  --   --   --   CRP 7.7* 6.9*  --  9.7*    Lab Results  Component Value Date   SARSCOV2NAA Detected (A) 10/03/2019   [place] Not Detected 08/06/2019     Fever: Low-grade fevers noted Oxygen requirements: Mechanical ventilation.  60% FiO2. Antibacterials: Was given vancomycin and cefepime in the ED.  Not continued. Remdesivir: Day 2 Steroids: Starting dexamethasone today Diuretics: None Actemra: Not given Convalescent plasma: Due to significant allergies to was decided to not pursue convalescent plasma for this patient. Vitamin C and Zinc: Continue DVT Prophylaxis:  Lovenox 40 mg every 12 hours  Patient remains mechanically ventilated.  Pulmonology is following and managing.  From a COVID-19 standpoint patient is on remdesivir.  She was not given steroids yesterday due to DKA.  Since DKA has resolved we can start dexamethasone today which will be ordered.  Due to significant allergies it was felt that convalescent plasma may not be suitable for this patient.  Her CRP is noted to be 9.7 today.  D-dimer 2.4.  Ferritin 82.  Continue to trend.  Mottled skin noted over the lower extremities.  Much better according to CCM when compared to yesterday.  Dopplerable pulses noted in both feet.  Continue to  monitor.  Insulin-dependent diabetes mellitus/diabetic ketoacidosis Patient presented with diabetic ketoacidosis.  HbA1c 14.7.  Apparently there is some noncompliance issues.  Patient was started on insulin infusion and given IV fluids.  Her DKA appears to have resolved.  Since we are going to start the patient on steroids we will leave her on insulin infusion for now.  Acute kidney injury/hypernatremia Most likely due to hypovolemia.  Baseline creatinine around 1.2.  Presented with a creatinine of 2.44.  Improved with hydration.  Creatinine 2.17 today BUN is 52.  Monitor urine output.  Recheck labs tomorrow.  Elevated sodium level likely due to free water deficits.  We will started on free water.  Leukocytosis Yesterday WBC was 23.8.  This was likely due to hemoconcentration.  Noted to be 11.9 today.  Continue to monitor.  Procalcitonin 0.18.  Acute metabolic encephalopathy Most likely due to DKA.  CT head was negative.  Currently she is intubated and sedated.  Assess neurological function when she is being weaned.  Elevated troponin Most likely due to demand ischemia.  Seen by cardiology.  No inpatient work-up.  Troponins were noted to be trending down.  EKG did not show any ischemic changes.  Echocardiogram will be ordered.  History of sinus tachycardia Recent TSH 2.84.  This is apparently a longstanding condition.  Diltiazem on hold.  History of migraine headaches Holding her as needed medications.  History of obesity status post gastric bypass Estimated body mass index is 37.76 kg/m as calculated from the following:   Height as of 10/06/19: _0  (1.626 m).   Weight as of 10/06/19: 99.8 kg.  History of depression and anxiety No history of drug overdose or suicide attempts.  Urine drug screen positive for tricyclic's only.  FEN We will change her IV fluids.  Electrolytes addressed above.  Tube feedings.   DVT Prophylaxis: Lovenox PUD Prophylaxis: On PPI Code Status: Full  code Family Communication: PCCM to discuss with family members. Disposition Plan: Remain in ICU   Medications:  Scheduled:  chlorhexidine gluconate (MEDLINE KIT)  15 mL Mouth Rinse BID   Chlorhexidine Gluconate Cloth  6 each Topical Q0600   dexamethasone  6 mg Per Tube Daily   enoxaparin (LOVENOX) injection  40 mg Subcutaneous Q12H   feeding supplement (PRO-STAT SUGAR FREE 64)  30 mL Per Tube BID   feeding supplement (VITAL HIGH PROTEIN)  1,000 mL Per Tube Q24H   free water  200 mL Per Tube Q4H   insulin regular  0-10 Units Intravenous TID WC   lamoTRIgine  150 mg Per Tube Daily   mouth rinse  15 mL Mouth Rinse 10 times per day   pantoprazole sodium  40 mg Per Tube Q1200   sodium chloride flush  3 mL Intravenous Q12H   vitamin C  500 mg Per Tube Daily   zinc sulfate  220 mg Per Tube Daily  Continuous:  sodium chloride Stopped (10/13/19 1846)   sodium chloride 10 mL/hr at 10/14/19 0800   dextrose 5 % and 0.45% NaCl 125 mL/hr at 10/14/19 0800   fentaNYL infusion INTRAVENOUS 200 mcg/hr (10/14/19 0800)   insulin 3.3 mL/hr at 10/14/19 0800   remdesivir 100 mg in NS 250 mL     RFX:[id number], albuterol, dextrose, fentaNYL, midazolam, midazolam   Objective:  Vital Signs  Vitals:   10/14/19 0615 10/14/19 0630 10/14/19 0645 10/14/19 0700  BP: 114/72 116/67 (!) 154/89 112/71  Pulse: 97 94 (!) 120 (!) 102  Resp: 11 (!) 0 (!) 30 (!) 5  Temp: 99.1 F (37.3 C) 99.3 F (37.4 C) 99.3 F (37.4 C) 99.3 F (37.4 C)  TempSrc:    Bladder  SpO2: 97% 97% 97% 97%    Intake/Output Summary (Last 24 hours) at 10/14/2019 0935 Last data filed at 10/14/2019 0800 Gross per 24 hour  Intake 4663.68 ml  Output 390 ml  Net 4273.68 ml   There were no vitals filed for this visit.  General appearance: Intubated and sedated Resp: Coarse breath sounds bilaterally.  Crackles at the bases.  No wheezing or rhonchi. Cardio: S1-S2 is normal regular.  No S3-S4.  No rubs murmurs  or bruit GI: Abdomen is soft.  Nontender nondistended.  Bowel sounds are present normal.  No masses organomegaly Extremities: No edema.  To be moving her extremities. Skin: Mottled skin noted over the both feet.  Pulses poorly palpable.  Posterior tibial palpable on both sides with Dopplers. Neurologic: Sedated   Lab Results:  Data Reviewed: I have personally reviewed following labs and imaging studies  CBC: Recent Labs  Lab 10/13/19 0904 10/13/19 1824 10/14/19 0321 10/14/19 0509  WBC 23.8*  --   --  11.9*  NEUTROABS 20.0*  --   --  10.3*  HGB 15.3* 13.3 12.2 12.1  HCT 52.2* 39.0 36.0 39.7  MCV 101.4*  --   --  97.3  PLT 761*  --   --  485*    Basic Metabolic Panel: Recent Labs  Lab 10/13/19 0904 10/13/19 1534 10/13/19 1824 10/13/19 2340 10/14/19 0321 10/14/19 0509  NA 152* 156* 159* 157* 158* 154*  K 4.5 4.1 3.3* 4.3 4.0 3.7  CL 114* 120*  --  127*  --  127*  CO2 11* 20*  --  19*  --  18*  GLUCOSE 709* 396*  --  219*  --  202*  BUN 53* 56*  --  52*  --  52*  CREATININE 2.44* 2.38*  --  2.03*  --  2.17*  CALCIUM 9.5 9.0  --  8.3*  --  8.1*  MG  --   --   --   --   --  2.0  PHOS  --   --   --   --   --  1.2*    GFR: Estimated Creatinine Clearance: 36 mL/min (A) (by C-G formula based on SCr of 2.17 mg/dL (H)).  Liver Function Tests: Recent Labs  Lab 10/13/19 0904 10/14/19 0509  AST 14* 11*  ALT 17 12  ALKPHOS 115 90  BILITOT 2.7* 0.4  PROT 8.0 5.6*  ALBUMIN 3.3* 2.2*    CBG: Recent Labs  Lab 10/14/19 0329 10/14/19 0430 10/14/19 0532 10/14/19 0635 10/14/19 0749  GLUCAP 186* 203* 169* 155* 169*    Lipid Profile: Recent Labs    10/13/19 0904  TRIG 614*     Anemia Panel: Recent Labs  10/13/19 0904 10/14/19 0510  FERRITIN 120 82    Recent Results (from the past 240 hour(s))  Blood Culture (routine x 2)     Status: None (Preliminary result)   Collection Time: 10/13/19  9:04 AM   Specimen: BLOOD  Result Value Ref Range Status    Specimen Description BLOOD LEFT AC  Final   Special Requests   Final    BOTTLES DRAWN AEROBIC AND ANAEROBIC Blood Culture adequate volume   Culture   Final    NO GROWTH < 24 HOURS Performed at [facility], [address] [place], [place] [address]    Report Status PENDING  Incomplete  Blood Culture (routine x 2)     Status: None (Preliminary result)   Collection Time: 10/13/19  9:04 AM   Specimen: BLOOD  Result Value Ref Range Status   Specimen Description BLOOD RIGHT FA  Final   Special Requests   Final    BOTTLES DRAWN AEROBIC AND ANAEROBIC Blood Culture adequate volume   Culture   Final    NO GROWTH < 24 HOURS Performed at [facility], [address] [place], [place] [address]    Report Status PENDING  Incomplete  Urine Culture     Status: Abnormal   Collection Time: 10/13/19  9:04 AM   Specimen: Urine, Random  Result Value Ref Range Status   Specimen Description   Final    URINE, RANDOM Performed at [facility], [address] [place], [place] [address]    Special Requests   Final    NONE Performed at [facility], [address] [place], [place] [address]    Culture MULTIPLE SPECIES PRESENT, SUGGEST RECOLLECTION (A)  Final   Report Status 10/14/2019 FINAL  Final  MRSA PCR Screening     Status: None   Collection Time: 10/13/19 11:51 PM   Specimen: Nasal Mucosa; Nasopharyngeal  Result Value Ref Range Status   MRSA by PCR NEGATIVE NEGATIVE Final    Comment:        The GeneXpert MRSA Assay (FDA approved for NASAL specimens only), is one component of a comprehensive MRSA colonization surveillance program. It is not intended to diagnose MRSA infection nor to guide or monitor treatment for MRSA infections. Performed at [facility], [place] [address], [place],  [address]       Radiology Studies: Dg Abd 1 View  Result Date: 10/13/2019 CLINICAL DATA:  OG tube placement EXAM: ABDOMEN - 1 VIEW  COMPARISON:  10/13/2019 FINDINGS: OG tube tip is in the upper abdomen, likely in the mid stomach. The side port is in the distal esophagus. IMPRESSION: NG tube tip likely in the mid stomach with the side port in the distal esophagus. Electronically Signed   By: Rolm Baptise M.D.   On: 10/13/2019 21:22   Dg Abd 1 View  Result Date: 10/13/2019 CLINICAL DATA:  Intubation, central line, nasogastric tube placement, COVID-19 EXAM: ABDOMEN - 1 VIEW COMPARISON:  Portable exam 1725 hours compared to 05/08/2011 FINDINGS: Patient rotated to RIGHT. Tip of nasogastric tube projects over distal gastric antrum. Mild elevation of RIGHT diaphragm. Paucity of bowel gas. IMPRESSION: Tip of nasogastric tube projects over distal gastric antrum. Electronically Signed   By: Lavonia Dana M.D.   On: 10/13/2019 18:01   Ct Head Wo Contrast  Result Date: 10/13/2019 CLINICAL DATA:  Altered mental status and leukocytosis EXAM: CT HEAD WITHOUT CONTRAST TECHNIQUE: Contiguous axial images were obtained from the base of the  skull through the vertex without intravenous contrast. COMPARISON:  November 22, 2009 FINDINGS: Brain: Ventricles are normal in size and configuration. There is no intracranial mass, hemorrhage, extra-axial fluid collection, or midline shift. The brain parenchyma appears unremarkable. No acute infarct is evident. Vascular: There is no hyperdense vessel. No evident vascular calcifications. Skull: The bony calvarium appears intact. Sinuses/Orbits: Paranasal sinuses are clear. Orbits appear symmetric bilaterally. Other: Mastoid air cells are clear. IMPRESSION: Study within normal limits.3 Electronically Signed   By: Lowella Grip III M.D.   On: 10/13/2019 14:40   Dg Chest Port 1 View  Result Date: 10/13/2019 CLINICAL DATA:  Central line placement, NG tube placement EXAM: PORTABLE CHEST 1 VIEW COMPARISON:  10/13/2019 FINDINGS: Endotracheal tube with the tip 3.3 cm above the carina. Nasogastric tube coursing into the  right upper quadrant. Left subclavian central venous catheter with the tip projecting over the SVC. Bilateral diffuse interstitial thickening. No pleural effusion or pneumothorax. Stable cardiomediastinal silhouette. No aggressive osseous lesion. IMPRESSION: 1. Endotracheal tube with the tip 3.3 cm above the carina. 2. Nasogastric tube coursing into the right upper quadrant. This is an atypical course for the nasogastric tube. Recommend further evaluation with a KUB. 3. Left subclavian central venous catheter with the tip projecting over the SVC. 4. Diffuse bilateral interstitial thickening unchanged compared with x-ray performed earlier same day. Electronically Signed   By: Kathreen Devoid   On: 10/13/2019 17:57   Dg Chest Port 1 View  Result Date: 10/13/2019 CLINICAL DATA:  Dyspnea. Additional history provided: Patient presents via EMS from home for altered mental status. EXAM: PORTABLE CHEST 1 VIEW COMPARISON:  Chest radiograph 10/05/2019 FINDINGS: Limited assessment due to patient rotation and shallow inspiration. Cardiomediastinal silhouette appears unchanged with heart size normal. Unchanged elevation of the right hemidiaphragm. Bilateral ill-defined opacities may reflect edema and/or infection. No evidence of pleural effusion or pneumothorax. No acute bony abnormality. IMPRESSION: Limited, shallow inspiration radiograph. Ill-defined opacities within the bilateral lungs may reflect edema and/or infection. Electronically Signed   By: Kellie Simmering DO   On: 10/13/2019 09:55       LOS: 1 day   [place] [facility] Pager on [email or web address]  10/14/2019, 9:35 AM

## 2019-10-14 NOTE — Progress Notes (Signed)
Paged ELink to notify of patient's recurring hypotension d/t propfol drip. Will administer 1L NS bolus per order and continue to monitor blood pressure closely.

## 2019-10-14 NOTE — Progress Notes (Signed)
Pt lavaged with 10cc normal saline per CCM MD request. Pt suctioned for small amount of clear secretions. Pt tolerated well, RT will continue to monitor.

## 2019-10-14 NOTE — Progress Notes (Signed)
[facility] PCCM  PaO2:FiO2 ratio < 150 Prone orders written Add propofol  Roselie Awkward, MD [place] PCCM Pager: [phone] Cell: [phone] If no response, call [phone]

## 2019-10-14 NOTE — Progress Notes (Signed)
[place] Progress Note Patient Name: Jerrolyn Azarian DOB: 1971/05/13 MRN: [id number]   Date of Service  10/14/2019  HPI/Events of Note  Hypotension - BP = 76/47 with MAP = 57.  eICU Interventions  Will bolus with 0.9 NaCl 1 liter IV over 1 hour now.      Intervention Category Major Interventions: Hypotension - evaluation and management  Dekendrick Uzelac [place] 10/14/2019, 10:26 PM

## 2019-10-15 ENCOUNTER — Inpatient Hospital Stay (HOSPITAL_COMMUNITY): Payer: BC Managed Care – PPO

## 2019-10-15 LAB — GLUCOSE, CAPILLARY
Glucose-Capillary: 126 mg/dL — ABNORMAL HIGH (ref 70–99)
Glucose-Capillary: 136 mg/dL — ABNORMAL HIGH (ref 70–99)
Glucose-Capillary: 159 mg/dL — ABNORMAL HIGH (ref 70–99)
Glucose-Capillary: 159 mg/dL — ABNORMAL HIGH (ref 70–99)
Glucose-Capillary: 161 mg/dL — ABNORMAL HIGH (ref 70–99)
Glucose-Capillary: 163 mg/dL — ABNORMAL HIGH (ref 70–99)
Glucose-Capillary: 166 mg/dL — ABNORMAL HIGH (ref 70–99)
Glucose-Capillary: 176 mg/dL — ABNORMAL HIGH (ref 70–99)
Glucose-Capillary: 183 mg/dL — ABNORMAL HIGH (ref 70–99)
Glucose-Capillary: 188 mg/dL — ABNORMAL HIGH (ref 70–99)
Glucose-Capillary: 198 mg/dL — ABNORMAL HIGH (ref 70–99)
Glucose-Capillary: 225 mg/dL — ABNORMAL HIGH (ref 70–99)

## 2019-10-15 LAB — POCT I-STAT 7, (LYTES, BLD GAS, ICA,H+H)
Acid-base deficit: 10 mmol/L — ABNORMAL HIGH (ref 0.0–2.0)
Bicarbonate: 15.3 mmol/L — ABNORMAL LOW (ref 20.0–28.0)
Calcium, Ion: 1.22 mmol/L (ref 1.15–1.40)
HCT: 30 % — ABNORMAL LOW (ref 36.0–46.0)
Hemoglobin: 10.2 g/dL — ABNORMAL LOW (ref 12.0–15.0)
O2 Saturation: 96 %
Patient temperature: 98.9
Potassium: 3.8 mmol/L (ref 3.5–5.1)
Sodium: 149 mmol/L — ABNORMAL HIGH (ref 135–145)
TCO2: 16 mmol/L — ABNORMAL LOW (ref 22–32)
pCO2 arterial: 31.7 mmHg — ABNORMAL LOW (ref 32.0–48.0)
pH, Arterial: 7.292 — ABNORMAL LOW (ref 7.350–7.450)
pO2, Arterial: 89 mmHg (ref 83.0–108.0)

## 2019-10-15 LAB — COMPREHENSIVE METABOLIC PANEL
ALT: 14 U/L (ref 0–44)
AST: 20 U/L (ref 15–41)
Albumin: 2.3 g/dL — ABNORMAL LOW (ref 3.5–5.0)
Alkaline Phosphatase: 80 U/L (ref 38–126)
Anion gap: 12 (ref 5–15)
BUN: 72 mg/dL — ABNORMAL HIGH (ref 6–20)
CO2: 16 mmol/L — ABNORMAL LOW (ref 22–32)
Calcium: 7.4 mg/dL — ABNORMAL LOW (ref 8.9–10.3)
Chloride: 121 mmol/L — ABNORMAL HIGH (ref 98–111)
Creatinine, Ser: 2.94 mg/dL — ABNORMAL HIGH (ref 0.44–1.00)
GFR calc Af Amer: 21 mL/min — ABNORMAL LOW (ref >60)
GFR calc non Af Amer: 18 mL/min — ABNORMAL LOW (ref >60)
Glucose, Bld: 188 mg/dL — ABNORMAL HIGH (ref 70–99)
Potassium: 4.7 mmol/L (ref 3.5–5.1)
Sodium: 149 mmol/L — ABNORMAL HIGH (ref 135–145)
Total Bilirubin: 0.5 mg/dL (ref 0.3–1.2)
Total Protein: 5.4 g/dL — ABNORMAL LOW (ref 6.5–8.1)

## 2019-10-15 LAB — C-REACTIVE PROTEIN: CRP: 11.9 mg/dL — ABNORMAL HIGH (ref <1.0)

## 2019-10-15 LAB — CBC WITH DIFFERENTIAL/PLATELET
Abs Immature Granulocytes: 0.06 10*3/uL (ref 0.00–0.07)
Basophils Absolute: 0 10*3/uL (ref 0.0–0.1)
Basophils Relative: 0 %
Eosinophils Absolute: 0 10*3/uL (ref 0.0–0.5)
Eosinophils Relative: 0 %
HCT: 35.6 % — ABNORMAL LOW (ref 36.0–46.0)
Hemoglobin: 10.6 g/dL — ABNORMAL LOW (ref 12.0–15.0)
Immature Granulocytes: 1 %
Lymphocytes Relative: 8 %
Lymphs Abs: 0.7 10*3/uL (ref 0.7–4.0)
MCH: 29.7 pg (ref 26.0–34.0)
MCHC: 29.8 g/dL — ABNORMAL LOW (ref 30.0–36.0)
MCV: 99.7 fL (ref 80.0–100.0)
Monocytes Absolute: 0.1 10*3/uL (ref 0.1–1.0)
Monocytes Relative: 1 %
Neutro Abs: 7.5 10*3/uL (ref 1.7–7.7)
Neutrophils Relative %: 90 %
Platelets: 372 10*3/uL (ref 150–400)
RBC: 3.57 MIL/uL — ABNORMAL LOW (ref 3.87–5.11)
RDW: 15.4 % (ref 11.5–15.5)
WBC: 8.3 10*3/uL (ref 4.0–10.5)
nRBC: 0.5 % — ABNORMAL HIGH (ref 0.0–0.2)

## 2019-10-15 LAB — PHOSPHORUS
Phosphorus: 4.3 mg/dL (ref 2.5–4.6)
Phosphorus: 4.2 mg/dL (ref 2.5–4.6)

## 2019-10-15 LAB — MAGNESIUM
Magnesium: 1.7 mg/dL (ref 1.7–2.4)
Magnesium: 1.6 mg/dL — ABNORMAL LOW (ref 1.7–2.4)

## 2019-10-15 LAB — FERRITIN: Ferritin: 67 ng/mL (ref 11–307)

## 2019-10-15 LAB — D-DIMER, QUANTITATIVE: D-Dimer, Quant: 1.39 ug/mL-FEU — ABNORMAL HIGH (ref 0.00–0.50)

## 2019-10-15 MED ORDER — INSULIN GLARGINE 100 UNIT/ML ~~LOC~~ SOLN
50.0000 [IU] | Freq: Every day | SUBCUTANEOUS | Status: DC
  Administered 2019-10-15 – 2019-10-17 (×3): 50 [IU] via SUBCUTANEOUS
  Filled 2019-10-15 (×3): qty 0.5

## 2019-10-15 MED ORDER — PHENYLEPHRINE HCL-NACL 10-0.9 MG/250ML-% IV SOLN
0.0000 ug/min | INTRAVENOUS | Status: DC
  Administered 2019-10-15: 20 ug/min via INTRAVENOUS
  Administered 2019-10-16: 13 ug/min via INTRAVENOUS
  Filled 2019-10-15 (×2): qty 250

## 2019-10-15 MED ORDER — CLONAZEPAM 0.5 MG PO TBDP
1.0000 mg | ORAL_TABLET | Freq: Every day | ORAL | Status: DC
  Administered 2019-10-15 – 2019-10-17 (×3): 1 mg
  Filled 2019-10-15 (×3): qty 2

## 2019-10-15 MED ORDER — MAGNESIUM SULFATE 2 GM/50ML IV SOLN
2.0000 g | Freq: Once | INTRAVENOUS | Status: AC
  Administered 2019-10-15: 2 g via INTRAVENOUS
  Filled 2019-10-15: qty 50

## 2019-10-15 MED ORDER — OXYCODONE HCL 5 MG/5ML PO SOLN
5.0000 mg | Freq: Four times a day (QID) | ORAL | Status: DC
  Administered 2019-10-15 – 2019-10-18 (×11): 5 mg
  Filled 2019-10-15 (×11): qty 5

## 2019-10-15 MED ORDER — HEPARIN SODIUM (PORCINE) 5000 UNIT/ML IJ SOLN: 1000.0000 [IU] | Freq: Three times a day (TID) | INTRAMUSCULAR | Status: DC

## 2019-10-15 MED ORDER — INSULIN ASPART 100 UNIT/ML ~~LOC~~ SOLN
0.0000 [IU] | SUBCUTANEOUS | Status: DC
  Administered 2019-10-15: 7 [IU] via SUBCUTANEOUS
  Administered 2019-10-15 – 2019-10-16 (×2): 4 [IU] via SUBCUTANEOUS
  Administered 2019-10-16: 11 [IU] via SUBCUTANEOUS
  Administered 2019-10-16 (×2): 7 [IU] via SUBCUTANEOUS
  Administered 2019-10-16 (×2): 3 [IU] via SUBCUTANEOUS
  Administered 2019-10-17: 11 [IU] via SUBCUTANEOUS
  Administered 2019-10-17: 4 [IU] via SUBCUTANEOUS
  Administered 2019-10-17: 7 [IU] via SUBCUTANEOUS
  Administered 2019-10-17 (×2): 4 [IU] via SUBCUTANEOUS
  Administered 2019-10-17: 11 [IU] via SUBCUTANEOUS
  Administered 2019-10-17 – 2019-10-18 (×4): 4 [IU] via SUBCUTANEOUS
  Administered 2019-10-19: 7 [IU] via SUBCUTANEOUS
  Administered 2019-10-19: 4 [IU] via SUBCUTANEOUS
  Administered 2019-10-19: 7 [IU] via SUBCUTANEOUS
  Administered 2019-10-19: 3 [IU] via SUBCUTANEOUS
  Administered 2019-10-20: 7 [IU] via SUBCUTANEOUS
  Administered 2019-10-20: 4 [IU] via SUBCUTANEOUS
  Administered 2019-10-20: 3 [IU] via SUBCUTANEOUS
  Administered 2019-10-20: 4 [IU] via SUBCUTANEOUS
  Administered 2019-10-21: 7 [IU] via SUBCUTANEOUS
  Administered 2019-10-21 (×2): 20 [IU] via SUBCUTANEOUS
  Administered 2019-10-22: 11 [IU] via SUBCUTANEOUS
  Administered 2019-10-22: 20 [IU] via SUBCUTANEOUS
  Administered 2019-10-22: 15 [IU] via SUBCUTANEOUS
  Administered 2019-10-23 (×2): 4 [IU] via SUBCUTANEOUS
  Administered 2019-10-23: 7 [IU] via SUBCUTANEOUS
  Administered 2019-10-24: 4 [IU] via SUBCUTANEOUS
  Administered 2019-10-24: 7 [IU] via SUBCUTANEOUS
  Administered 2019-10-24: 4 [IU] via SUBCUTANEOUS
  Administered 2019-10-24: 3 [IU] via SUBCUTANEOUS
  Administered 2019-10-25: 11 [IU] via SUBCUTANEOUS

## 2019-10-15 MED ORDER — HEPARIN SODIUM (PORCINE) 5000 UNIT/ML IJ SOLN
5000.0000 [IU] | Freq: Three times a day (TID) | INTRAMUSCULAR | Status: DC
  Administered 2019-10-16 – 2019-10-25 (×28): 5000 [IU] via SUBCUTANEOUS
  Filled 2019-10-15 (×26): qty 1

## 2019-10-15 NOTE — Procedures (Signed)
Cortrak  Person Inserting Tube:  Maria Bentley, RD Tube Type:  Cortrak - 43 inches Tube Location:  Left nare Initial Placement:  Postpyloric Secured by: Bridle Technique Used to Measure Tube Placement:  Documented cm marking at nare/ corner of mouth Cortrak Secured At:  95 cm    Cortrak Tube Team Note:  Consult received to place a Cortrak feeding tube.   X-ray ordered based on cortrak display, abdominal x-ray has been ordered by the Cortrak team. Please confirm tube placement before using the Cortrak tube.   If the tube becomes dislodged please keep the tube and contact the Cortrak team at [email or web address] (password [email or web address]) for replacement.  If after hours and replacement cannot be delayed, place a NG tube and confirm placement with an abdominal x-ray.    [name], [name], [name] Pager [phone] After Hours Pager

## 2019-10-15 NOTE — Progress Notes (Signed)
Pt head turned to right side at this time with no complications. Pt fighting [place] despite bolus of sedation meds. VS within normal limits. ETT remains taped and secure at proper location.

## 2019-10-15 NOTE — Progress Notes (Signed)
Patient remains proned. Arms rotated. ETT secured 23 cm @lip .

## 2019-10-15 NOTE — Progress Notes (Signed)
Pt's head turned at this time with no complications.  RT will continue to monitor.

## 2019-10-15 NOTE — Progress Notes (Signed)
Pt placed supine at this time with no complications. VS within normal limits. ETT secured with commercial tube holder in proper position. Small red patch noticed on Right cheek. RN aware. Brown Mepilex pad placed on top of it under the commercial tube holder for added protection

## 2019-10-15 NOTE — Progress Notes (Signed)
NAME:  Maria Bentley, MRN:  [id number], DOB:  Jun 25, 1971, LOS: 2 ADMISSION DATE:  10/13/2019, CONSULTATION DATE: November 2 REFERRING MD: Dr. Jimmye Norman, CHIEF COMPLAINT: Confusion  Brief History   48 year old female with diabetes mellitus presented to the [facility] with DKA and COVID-19 pneumonia.  Transferred to [facility] and was intubated on arrival for inability to protect airway and hypoxemic respiratory failure.  Past Medical History  Diabetes mellitus Hypertension Hyperlipidemia Morbid obesity  Significant Hospital Events   November 2 admission November 3 prone position  Consults:  PCCM  Procedures:  November 2 endotracheal tube November 2 left subclavian central venous line  Significant Diagnostic Tests:  11/2 CT head > NAICP  Micro Data:  11/2 blood >   Antimicrobials:  11/2 remdesivir >   Interim history/subjective:   Moved to prone position overnight  Objective   Blood pressure (!) 104/53, pulse 65, temperature 99.9 F (37.7 C), resp. rate (!) 28, weight 97 kg, last menstrual period 05/09/2016, SpO2 96 %.    Vent Mode: PRVC FiO2 (%):  [40 %-50 %] 40 % Set Rate:  [28 bmp] 28 bmp Vt Set:  [430 mL] 430 mL PEEP:  [10 cmH20] 10 cmH20 Plateau Pressure:  [24 cmH20-28 cmH20] 24 cmH20   Intake/Output Summary (Last 24 hours) at 10/15/2019 0748 Last data filed at 10/15/2019 0500 Gross per 24 hour  Intake 3639.42 ml  Output 460 ml  Net 3179.42 ml   Filed Weights   10/15/19 0444  Weight: 97 kg    Examination:  General:  In bed on vent, prone HENT: NCAT ETT in place PULM: Crackles bases, vent supported breathing CV: difficult to examine due to prone position GI: flanks soft, BS+ MSK: normal bulk and tone Neuro: sedated on vent    Resolved Hospital Problem list     Assessment & Plan:  Severe acute respiratory failure with hypoxemia due to COVID-19 pneumonia: Continue mechanical ventilation per ARDS  protocol Target TVol 6-8cc/kgIBW Target Plateau Pressure < 30cm H20 Target driving pressure less than 15 cm of water Target PaO2 55-65: titrate PEEP/FiO2 per protocol As long as PaO2 to FiO2 ratio is less than 1:150 position in prone position for 16 hours a day Check CVP daily if CVL in place Target CVP less than 4, diurese as necessary Ventilator associated pneumonia prevention protocol 11/4 move back to supine position, check ABG to see if she still needs prone, hold lasix with worsening renal function  Need for sedation/mechanical ventilation: Acute encepahlopathy due to DKA: improved on 11/3,  RASS target -3 (adjust to -3 given need for prone) Add oxycodone and clonazepam Fentanyl, propofol infusions   DKA: gap closed 11/3 Change to lantus and SSI today  Worsening AKI Check CVP Hold diuretics for now  Best practice:  Diet: tube feeding Pain/Anxiety/Delirium protocol (if indicated): as above VAP protocol (if indicated): yes DVT prophylaxis: lovenox GI prophylaxis: Pantoprazole for stress ulcer prophylaxis Glucose control: per DKA protocol Mobility: bed rest Code Status: ful Family Communication: I updated her husband Jeneen Rinks today Disposition: remain in ICU  Labs   CBC: Recent Labs  Lab 10/13/19 0904 10/13/19 1824 10/14/19 0321 10/14/19 0509 10/14/19 1552 10/15/19 0505  WBC 23.8*  --   --  11.9*  --  8.3  NEUTROABS 20.0*  --   --  10.3*  --  7.5  HGB 15.3* 13.3 12.2 12.1 11.6* 10.6*  HCT 52.2* 39.0 36.0 39.7 34.0* 35.6*  MCV 101.4*  --   --  97.3  --  99.7  PLT 761*  --   --  485*  --  [id number]    Basic Metabolic Panel: Recent Labs  Lab 10/13/19 0904 10/13/19 1534  10/13/19 2340 10/14/19 0321 10/14/19 0509 10/14/19 1552 10/14/19 1611 10/15/19 0505  NA 152* 156*   < > 157* 158* 154* 157*  --  149*  K 4.5 4.1   < > 4.3 4.0 3.7 3.7  --  4.7  CL 114* 120*  --  127*  --  127*  --   --  121*  CO2 11* 20*  --  19*  --  18*  --   --  16*  GLUCOSE 709* 396*  --   219*  --  202*  --   --  188*  BUN 53* 56*  --  52*  --  52*  --   --  72*  CREATININE 2.44* 2.38*  --  2.03*  --  2.17*  --   --  2.94*  CALCIUM 9.5 9.0  --  8.3*  --  8.1*  --   --  7.4*  MG  --   --   --   --   --  2.0  --  1.8 1.7  PHOS  --   --   --   --   --  1.2*  --  2.5 4.3   < > = values in this interval not displayed.   GFR: Estimated Creatinine Clearance: 26.2 mL/min (A) (by C-G formula based on SCr of 2.94 mg/dL (H)). Recent Labs  Lab 10/13/19 0904 10/14/19 0509 10/15/19 0505  PROCALCITON 0.18  --   --   WBC 23.8* 11.9* 8.3  LATICACIDVEN 1.7  --   --     Liver Function Tests: Recent Labs  Lab 10/13/19 0904 10/14/19 0509 10/15/19 0505  AST 14* 11* 20  ALT 17 12 14   ALKPHOS 115 90 80  BILITOT 2.7* 0.4 0.5  PROT 8.0 5.6* 5.4*  ALBUMIN 3.3* 2.2* 2.3*   No results for input(s): LIPASE, AMYLASE in the last 168 hours. No results for input(s): AMMONIA in the last 168 hours.  ABG    Component Value Date/Time   PHART 7.332 (L) 10/14/2019 1552   PCO2ART 33.9 10/14/2019 1552   PO2ART 68.0 (L) 10/14/2019 1552   HCO3 17.8 (L) 10/14/2019 1552   TCO2 19 (L) 10/14/2019 1552   ACIDBASEDEF 7.0 (H) 10/14/2019 1552   O2SAT 91.0 10/14/2019 1552     Coagulation Profile: No results for input(s): INR, PROTIME in the last 168 hours.  Cardiac Enzymes: No results for input(s): CKTOTAL, CKMB, CKMBINDEX, TROPONINI in the last 168 hours.  HbA1C: Hemoglobin A1C  Date/Time Value Ref Range Status  09/19/2019 04:45 PM 14.7 (A) 4.0 - 5.6 % Final  08/19/2018 03:33 PM 8.7 (A) 4.0 - 5.6 % Final   Hgb A1c MFr Bld  Date/Time Value Ref Range Status  04/19/2018 12:28 PM 11.5 (H) 4.6 - 6.5 % Final    Comment:    Glycemic Control Guidelines for People with Diabetes:Non Diabetic:  <6%Goal of Therapy: <7%Additional Action Suggested:  >8%   06/27/2016 09:34 AM 9.1 (H) 4.6 - 6.5 % Final    Comment:    Glycemic Control Guidelines for People with Diabetes:Non Diabetic:  <6%Goal of  Therapy: <7%Additional Action Suggested:  >8%     CBG: Recent Labs  Lab 10/15/19 0039 10/15/19 0145 10/15/19 0255 10/15/19 0458 10/15/19 0557  GLUCAP 161* 183* 163* 198*  176*     Critical care time: 40 minutes    Roselie Awkward, MD [place] Pager: [phone] Cell: [phone] If no response, call [phone]

## 2019-10-15 NOTE — Progress Notes (Signed)
Gold colored band removed from patient's left ring finger and placed in biohazard bag with patient label and sent down to security to be locked up safely. Charge RN aware.

## 2019-10-15 NOTE — Research (Signed)
[place] PACT TRIAL Informed Consent   Subject Name: Maria Bentley  Subject met inclusion and exclusion criteria.  The informed consent form, study requirements and expectations were reviewed with the subject and questions and concerns were addressed prior to the signing of the consent form.  The subject verbalized understanding of the trail requirements.  The subject agreed to participate in the [place] PACT trial and signed the informed consent.  The informed consent was obtained prior to performance of any protocol-specific procedures for the subject.  A copy of the signed informed consent was given to the subject and a copy was placed in the subject's medical record.  Philemon Kingdom D 10/15/2019, 1450 PM

## 2019-10-15 NOTE — Progress Notes (Signed)
Pt head turned to left with no complications. VS within normal limits. ETT remains secure and in proper position.

## 2019-10-15 NOTE — Progress Notes (Signed)
[name] Progress Note Patient Name: Maria Bentley DOB: January 30, 1971 MRN: [id number]   Date of Service  10/15/2019  HPI/Events of Note  Hypotension - BP = 86/48 with MAP = 60. Patient is on Fentanyl and Propofol IV infusions for sedation. CVP = 18.   eICU Interventions  Will order: 1. Phenylephrine IV infusion. Titrate to MAP >= 65.  2. Wean Propofol IV infusion off as tolerated.  3. Titrate Fentanyl IV infusion up for hemodynamic support.      Intervention Category Major Interventions: Hypotension - evaluation and management  Roddy Bellamy Eugene 10/15/2019, 9:35 PM

## 2019-10-15 NOTE — Progress Notes (Addendum)
Inpatient Diabetes Program Recommendations  AACE/ADA: New Consensus Statement on Inpatient Glycemic Control (2015)  Target Ranges:  Prepandial:   less than 140 mg/dL      Peak postprandial:   less than 180 mg/dL (1-2 hours)      Critically ill patients:  140 - 180 mg/dL   Lab Results  Component Value Date   GLUCAP 136 (H) 10/15/2019   HGBA1C 14.7 (A) 09/19/2019    Review of Glycemic Control  Diabetes history: DM 2 Outpatient Diabetes medications: Basaglar 50 units Daily, Novolog 15 units tid meal coverage + SSI 3-5 units tid, Metformin 2000 mg Daily  Current orders for Inpatient glycemic control:  IV insulin gtt 3-5 units/hour  Renal function elevated Decadron 6 mg Daily Vital AF 1.2 40 ml/hour  Inpatient Diabetes Program Recommendations:    Patient critically ill intubated. Glucose control while on IV insulin however, insulin gtt rate 3-5 units/hour (mostly averaging 4 units/hr). Could utilize COVID Glycemic control order set transition from IV insulin if desired.  Suggested by order set: Allergy to Levemir Consider Lantus 20 bid Novolog 7 units Q4 Tube Feed Coverage + desired correction scale  Note A1c 14.7% at PCP visit on 10/9 started on basal insulin (30 units at the time) and given instructions on titration outpatient with weekly follow up.   Will follow.  Thanks,  Tama Headings RN, MSN, BC-ADM Inpatient Diabetes Coordinator Team Pager [phone] (8a-5p)

## 2019-10-15 NOTE — Progress Notes (Signed)
ABG results given to Dr.McQuaid. Pt does not need to prone at this time. No other orders received at this time. RT will continue to monitor.

## 2019-10-15 NOTE — Progress Notes (Signed)
Pt's head turned at this time without complications.  RT will continue to monitor.

## 2019-10-15 NOTE — Progress Notes (Signed)
Patient's husband Jeneen Rinks was called and updated on patient's condition, mad ehim aware we would be placing her supine in the next little while. Questions answered and informed him we would call if there were any changes.

## 2019-10-15 NOTE — Progress Notes (Signed)
ANTICOAGULATION CONSULT NOTE - Initial Consult  Pharmacy Consult for Heparin Indication: VTE prophylaxis, COVID-PACT Trial  Allergies  Allergen Reactions  . Amoxicillin Hives and Rash  . Avocado Swelling  . Clarithromycin Hives and Rash  . Honey Anaphylaxis  . Other Hives and Swelling    All melon  . Sulfa Drugs Cross Reactors Anaphylaxis  . Watermelon Concentrate Hives and Swelling    Oral swelling, ears swell shut  . Metoprolol Other (See Comments)    Hypotension  . Adhesive [Tape] Itching    Skin breakdown  . Levemir [Insulin Detemir] Rash    Red/swollen injection site reactions with residual lumps  . Victoza [Liraglutide] Nausea Only    Patient Measurements: Weight: 213 lb 13.5 oz (97 kg)  Vital Signs: Temp: 98.3 F (36.8 C) (11/04 1940) Temp Source: Oral (11/04 1940) BP: 84/48 (11/04 2000) Pulse Rate: 58 (11/04 2000)  Labs: Recent Labs    10/13/19 0904 10/13/19 1534 10/13/19 1800  10/13/19 2340  10/14/19 0509 10/14/19 1552 10/15/19 0505 10/15/19 1822  HGB 15.3*  --   --    < >  --    < > 12.1 11.6* 10.6* 10.2*  HCT 52.2*  --   --    < >  --    < > 39.7 34.0* 35.6* 30.0*  PLT 761*  --   --   --   --   --  485*  --  372  --   CREATININE 2.44* 2.38*  --   --  2.03*  --  2.17*  --  2.94*  --   TROPONINIHS 629* 719* 661*  --   --   --   --   --   --   --    < > = values in this interval not displayed.    Estimated Creatinine Clearance: 26.2 mL/min (A) (by C-G formula based on SCr of 2.94 mg/dL (H)).   Medical History: Past Medical History:  Diagnosis Date  . Anemia   . Anxiety   . Depression 2010  . Diabetes mellitus   . Endometriosis   . Headache, common migraine, intractable   . History of ovarian cyst    left sided, caused by her endometriosis (possibly an endometrioma), in her 48s.  Marland Kitchen Hyperlipidemia   . Hypertension   . Kidney stones    left  . Morbid obesity ([place])    s/p gastric bypass  . Personal hx of gastric bypass 2010  . Rosacea    . Vertigo      Medications:  Scheduled:  . chlorhexidine gluconate (MEDLINE KIT)  15 mL Mouth Rinse BID  . Chlorhexidine Gluconate Cloth  6 each Topical Q0600  . clonazePAM  1 mg Per Tube QHS  . dexamethasone  6 mg Per Tube Daily  . feeding supplement (PRO-STAT SUGAR FREE 64)  30 mL Per Tube TID  . feeding supplement (VITAL AF 1.2 CAL)  1,000 mL Per Tube Q24H  . free water  200 mL Per Tube Q4H  . [START ON 10/16/2019] heparin injection (subcutaneous)  5,000 Units Subcutaneous Q8H  . insulin aspart  0-20 Units Subcutaneous Q4H  . insulin glargine  50 Units Subcutaneous Daily  . lamoTRIgine  150 mg Per Tube Daily  . mouth rinse  15 mL Mouth Rinse 10 times per day  . oxyCODONE  5 mg Per Tube Q6H  . pantoprazole sodium  40 mg Per Tube Q1200  . sodium chloride flush  3 mL Intravenous Q12H  .  vitamin C  500 mg Per Tube Daily  . zinc sulfate  220 mg Per Tube Daily   Infusions:  . sodium chloride Stopped (10/15/19 1828)  . fentaNYL infusion INTRAVENOUS 200 mcg/hr (10/15/19 1900)  . propofol (DIPRIVAN) infusion 20 mcg/kg/min (10/15/19 1900)  . remdesivir 100 mg in NS 250 mL Stopped (10/15/19 1632)   PRN: acetaminophen, albuterol, dextrose, fentaNYL, midazolam, midazolam  Assessment: 48 yo  Female admitted 10/13/19 with DKA and COVID-19 pneumonia, intubated for hypoxemic respiratory failure. Enrolled into COVID-PACT trial today, heparin VTE prophylaxis arm.  Hgb 10.2, Plts 372k, SCr 2.94 with CrCl < 30 ml/min.  Goal of Therapy:  VTE prophlyaxis Monitor platelets by anticoagulation protocol: Yes   Plan:  Heparin 5000 units SQ q8h Monitor CBC, signs/symptoms of bleeding  Peggyann Juba, PharmD, BCPS Pharmacy: [phone] 10/15/2019,8:03 PM

## 2019-10-15 NOTE — Progress Notes (Addendum)
Maria Bentley  [id number] DOB: [birth year] DOA: 10/13/2019 PCP: [name], MD    Brief Narrative:  48 y.o.femalewith a history ofpoorly-controlleddiabetes, recurrent rectal abscesses, morbid obesity s/p gastric bypass with vitamin D deficiency, depression and anxiety who presented to the ED at [facility] 11/2 after being found on thebedroomfloor by her husband. They both have covid-19, and he has been sick, nearly bed-ridden all weekend so is not aware of her last couple days. He had heard her getting up to the kitchen and bathroom regularly, not sure if she was taking medications as usual or not, though she struggles with that at times. This morning he wasn't able to get her off the floor, so called EMS. On arrival to the ED shefelt cool and appeared mottledwith hypothermia, sinus tachycardia and hypoxia requiring 2L O2. UA without signs of infection, but with leukocytosis treated as sepsis with bilateral CXR infiltrates on hypoexpanded film with vancomycin, cefepime, flagyl. pH initially 7.03, gap 27, gave 2L warm saline, and starting insulin gtt. HR 120, no hypotension, RR 25/min, 98% on 2L O2. Remainedconfused, removing mask, and on admission to [facility] ICU was obtunded and intubated on arrival.  Significant Events: 11/2 admit to [facility] via [facility] -intubated upon arrival at [facility] 11/4 cortrak placed   COVID-19 specific Treatment: Remdesivir 11/2 > Decadron 11/3 >  Subjective: Sedated on vent in prone position.  Assessment & Plan:  Covid pneumonia -acute hypoxic respiratory failure Vent management per PCCM -continue remdesivir and Decadron -no convalescent plasma due to multiple allergies -no Actemra due to questionable septic picture  Uncontrolled DM2 with DKA A1c 14.7 -history of noncompliance with medications -CBG much improved today with improved anion gap -transitioning off insulin drip today  Acute kidney injury Appears to be related to hypovolemia with probable  component of ATN -follow trend  Recent Labs  Lab 10/13/19 0904 10/13/19 1534 10/13/19 2340 10/14/19 0509 10/15/19 0505  CREATININE 2.44* 2.38* 2.03* 2.17* 2.94*    Acute metabolic encephalopathy Due to DKA and Covid -CT head negative  Elevated troponin Has been evaluated by cardiology -felt to be due to demand ischemia -no ischemic changes appreciated on EKG  Chronic sinus tachycardia Reportedly a longstanding issue -recent TSH 224  Migraine headaches  History of obesity status post gastric bypass - Estimated body mass index is 36.71 kg/m as calculated from the following:   Height as of 10/06/19: 5' 4"  (1.626 m).   Weight as of this encounter: 97 kg.  History of depression with anxiety  DVT prophylaxis: Lovenox Code Status: FULL CODE Family Communication: Per PCCM Disposition Plan: ICU  Consultants:  PCCM  Antimicrobials:  None  Objective: Blood pressure 106/66, pulse (!) 59, temperature 98.8 F (37.1 C), temperature source Axillary, resp. rate (!) 28, weight 97 kg, last menstrual period 05/09/2016, SpO2 100 %.  Intake/Output Summary (Last 24 hours) at 10/15/2019 1752 Last data filed at 10/15/2019 1602 Gross per 24 hour  Intake 3124.89 ml  Output 565 ml  Net 2559.89 ml   Filed Weights   10/15/19 0444  Weight: 97 kg    Examination: General: Sedated on vent in prone position Lungs: Fine crackles diffusely Cardiovascular: RRR Abdomen: Soft, obese, bowel sounds hypoactive Extremities: Trace bilateral lower extremity edema  CBC: Recent Labs  Lab 10/13/19 0904  10/14/19 0509 10/14/19 1552 10/15/19 0505  WBC 23.8*  --  11.9*  --  8.3  NEUTROABS 20.0*  --  10.3*  --  7.5  HGB 15.3*   < >  12.1 11.6* 10.6*  HCT 52.2*   < > 39.7 34.0* 35.6*  MCV 101.4*  --  97.3  --  99.7  PLT 761*  --  485*  --  372   < > = values in this interval not displayed.   Basic Metabolic Panel: Recent Labs  Lab 10/13/19 2340  10/14/19 0509 10/14/19 1552 10/14/19 1611  10/15/19 0505  NA 157*   < > 154* 157*  --  149*  K 4.3   < > 3.7 3.7  --  4.7  CL 127*  --  127*  --   --  121*  CO2 19*  --  18*  --   --  16*  GLUCOSE 219*  --  202*  --   --  188*  BUN 52*  --  52*  --   --  72*  CREATININE 2.03*  --  2.17*  --   --  2.94*  CALCIUM 8.3*  --  8.1*  --   --  7.4*  MG  --   --  2.0  --  1.8 1.7  PHOS  --   --  1.2*  --  2.5 4.3   < > = values in this interval not displayed.   GFR: Estimated Creatinine Clearance: 26.2 mL/min (A) (by C-G formula based on SCr of 2.94 mg/dL (H)).  Liver Function Tests: Recent Labs  Lab 10/13/19 0904 10/14/19 0509 10/15/19 0505  AST 14* 11* 20  ALT 17 12 14   ALKPHOS 115 90 80  BILITOT 2.7* 0.4 0.5  PROT 8.0 5.6* 5.4*  ALBUMIN 3.3* 2.2* 2.3*    HbA1C: Hemoglobin A1C  Date/Time Value Ref Range Status  09/19/2019 04:45 PM 14.7 (A) 4.0 - 5.6 % Final  08/19/2018 03:33 PM 8.7 (A) 4.0 - 5.6 % Final   Hgb A1c MFr Bld  Date/Time Value Ref Range Status  04/19/2018 12:28 PM 11.5 (H) 4.6 - 6.5 % Final    Comment:    Glycemic Control Guidelines for People with Diabetes:Non Diabetic:  <6%Goal of Therapy: <7%Additional Action Suggested:  >8%   06/27/2016 09:34 AM 9.1 (H) 4.6 - 6.5 % Final    Comment:    Glycemic Control Guidelines for People with Diabetes:Non Diabetic:  <6%Goal of Therapy: <7%Additional Action Suggested:  >8%     CBG: Recent Labs  Lab 10/15/19 0854 10/15/19 0943 10/15/19 1058 10/15/19 1212 10/15/19 1648  GLUCAP 136* 126* 159* 159* 188*    Recent Results (from the past 240 hour(s))  Blood Culture (routine x 2)     Status: None (Preliminary result)   Collection Time: 10/13/19  9:04 AM   Specimen: BLOOD  Result Value Ref Range Status   Specimen Description BLOOD LEFT [facility]  Final   Special Requests   Final    BOTTLES DRAWN AEROBIC AND ANAEROBIC Blood Culture adequate volume   Culture   Final    NO GROWTH 2 DAYS Performed at [facility], [address] [place], [place] [address]     Report Status PENDING  Incomplete  Blood Culture (routine x 2)     Status: None (Preliminary result)   Collection Time: 10/13/19  9:04 AM   Specimen: BLOOD  Result Value Ref Range Status   Specimen Description BLOOD RIGHT FA  Final   Special Requests   Final    BOTTLES DRAWN AEROBIC AND ANAEROBIC Blood Culture adequate volume   Culture   Final    NO GROWTH 2 DAYS Performed  at [facility], [address] [place], [place] [address]    Report Status PENDING  Incomplete  Urine Culture     Status: Abnormal   Collection Time: 10/13/19  9:04 AM   Specimen: Urine, Random  Result Value Ref Range Status   Specimen Description   Final    URINE, RANDOM Performed at [facility], [address] [place], [place] [address]    Special Requests   Final    NONE Performed at [facility], [address] [place], [place] [address]    Culture MULTIPLE SPECIES PRESENT, SUGGEST RECOLLECTION (A)  Final   Report Status 10/14/2019 FINAL  Final  MRSA PCR Screening     Status: None   Collection Time: 10/13/19 11:51 PM   Specimen: Nasal Mucosa; Nasopharyngeal  Result Value Ref Range Status   MRSA by PCR NEGATIVE NEGATIVE Final    Comment:        The GeneXpert MRSA Assay (FDA approved for NASAL specimens only), is one component of a comprehensive MRSA colonization surveillance program. It is not intended to diagnose MRSA infection nor to guide or monitor treatment for MRSA infections. Performed at [facility], [place] [address], [place], [place] [address]      Scheduled Meds: . chlorhexidine gluconate (MEDLINE KIT)  15 mL Mouth Rinse BID  . Chlorhexidine Gluconate Cloth  6 each Topical Q0600  . clonazePAM  1 mg Per Tube QHS  . dexamethasone  6 mg Per Tube Daily  . feeding supplement (PRO-STAT SUGAR FREE 64)  30 mL Per Tube TID  . feeding supplement (VITAL AF 1.2 CAL)  1,000 mL Per Tube Q24H  . free water  200 mL Per Tube Q4H  . [START ON  10/16/2019] heparin injection (subcutaneous)  1,000 Units Subcutaneous Q8H  . insulin aspart  0-20 Units Subcutaneous Q4H  . insulin glargine  50 Units Subcutaneous Daily  . lamoTRIgine  150 mg Per Tube Daily  . mouth rinse  15 mL Mouth Rinse 10 times per day  . oxyCODONE  5 mg Per Tube Q6H  . pantoprazole sodium  40 mg Per Tube Q1200  . sodium chloride flush  3 mL Intravenous Q12H  . vitamin C  500 mg Per Tube Daily  . zinc sulfate  220 mg Per Tube Daily   Continuous Infusions: . sodium chloride 10 mL/hr at 10/15/19 1601  . fentaNYL infusion INTRAVENOUS 200 mcg/hr (10/15/19 1600)  . propofol (DIPRIVAN) infusion 30 mcg/kg/min (10/15/19 1600)  . remdesivir 100 mg in NS 250 mL 100 mg (10/15/19 1602)     LOS: 2 days   Cherene Altes, MD [facility] Office  [phone] Pager - Text Page per Amion  If 7PM-7AM, please contact night-coverage per Amion 10/15/2019, 5:52 PM

## 2019-10-16 DIAGNOSIS — E1111 Type 2 diabetes mellitus with ketoacidosis with coma: Secondary | ICD-10-CM

## 2019-10-16 DIAGNOSIS — L8990 Pressure ulcer of unspecified site, unspecified stage: Secondary | ICD-10-CM | POA: Diagnosis not present

## 2019-10-16 DIAGNOSIS — E11649 Type 2 diabetes mellitus with hypoglycemia without coma: Secondary | ICD-10-CM

## 2019-10-16 LAB — COMPREHENSIVE METABOLIC PANEL
ALT: 17 U/L (ref 0–44)
AST: 22 U/L (ref 15–41)
Albumin: 2.1 g/dL — ABNORMAL LOW (ref 3.5–5.0)
Alkaline Phosphatase: 78 U/L (ref 38–126)
Anion gap: 12 (ref 5–15)
BUN: 84 mg/dL — ABNORMAL HIGH (ref 6–20)
CO2: 17 mmol/L — ABNORMAL LOW (ref 22–32)
Calcium: 7.9 mg/dL — ABNORMAL LOW (ref 8.9–10.3)
Chloride: 119 mmol/L — ABNORMAL HIGH (ref 98–111)
Creatinine, Ser: 3.16 mg/dL — ABNORMAL HIGH (ref 0.44–1.00)
GFR calc Af Amer: 19 mL/min — ABNORMAL LOW (ref >60)
GFR calc non Af Amer: 16 mL/min — ABNORMAL LOW (ref >60)
Glucose, Bld: 203 mg/dL — ABNORMAL HIGH (ref 70–99)
Potassium: 3.7 mmol/L (ref 3.5–5.1)
Sodium: 148 mmol/L — ABNORMAL HIGH (ref 135–145)
Total Bilirubin: 0.5 mg/dL (ref 0.3–1.2)
Total Protein: 5.3 g/dL — ABNORMAL LOW (ref 6.5–8.1)

## 2019-10-16 LAB — CBC WITH DIFFERENTIAL/PLATELET
Abs Immature Granulocytes: 0.15 10*3/uL — ABNORMAL HIGH (ref 0.00–0.07)
Basophils Absolute: 0 10*3/uL (ref 0.0–0.1)
Basophils Relative: 0 %
Eosinophils Absolute: 0 10*3/uL (ref 0.0–0.5)
Eosinophils Relative: 0 %
HCT: 35.1 % — ABNORMAL LOW (ref 36.0–46.0)
Hemoglobin: 10.6 g/dL — ABNORMAL LOW (ref 12.0–15.0)
Immature Granulocytes: 1 %
Lymphocytes Relative: 15 %
Lymphs Abs: 1.7 10*3/uL (ref 0.7–4.0)
MCH: 29.9 pg (ref 26.0–34.0)
MCHC: 30.2 g/dL (ref 30.0–36.0)
MCV: 99.2 fL (ref 80.0–100.0)
Monocytes Absolute: 0.3 10*3/uL (ref 0.1–1.0)
Monocytes Relative: 3 %
Neutro Abs: 8.7 10*3/uL — ABNORMAL HIGH (ref 1.7–7.7)
Neutrophils Relative %: 81 %
Platelets: 372 10*3/uL (ref 150–400)
RBC: 3.54 MIL/uL — ABNORMAL LOW (ref 3.87–5.11)
RDW: 15.8 % — ABNORMAL HIGH (ref 11.5–15.5)
WBC: 10.9 10*3/uL — ABNORMAL HIGH (ref 4.0–10.5)
nRBC: 1 % — ABNORMAL HIGH (ref 0.0–0.2)

## 2019-10-16 LAB — POCT I-STAT 7, (LYTES, BLD GAS, ICA,H+H)
Acid-base deficit: 9 mmol/L — ABNORMAL HIGH (ref 0.0–2.0)
Bicarbonate: 15.8 mmol/L — ABNORMAL LOW (ref 20.0–28.0)
Calcium, Ion: 1.24 mmol/L (ref 1.15–1.40)
HCT: 30 % — ABNORMAL LOW (ref 36.0–46.0)
Hemoglobin: 10.2 g/dL — ABNORMAL LOW (ref 12.0–15.0)
O2 Saturation: 98 %
Patient temperature: 98.9
Potassium: 3.6 mmol/L (ref 3.5–5.1)
Sodium: 149 mmol/L — ABNORMAL HIGH (ref 135–145)
TCO2: 17 mmol/L — ABNORMAL LOW (ref 22–32)
pCO2 arterial: 29.6 mmHg — ABNORMAL LOW (ref 32.0–48.0)
pH, Arterial: 7.338 — ABNORMAL LOW (ref 7.350–7.450)
pO2, Arterial: 102 mmHg (ref 83.0–108.0)

## 2019-10-16 LAB — GLUCOSE, CAPILLARY
Glucose-Capillary: 133 mg/dL — ABNORMAL HIGH (ref 70–99)
Glucose-Capillary: 146 mg/dL — ABNORMAL HIGH (ref 70–99)
Glucose-Capillary: 200 mg/dL — ABNORMAL HIGH (ref 70–99)
Glucose-Capillary: 217 mg/dL — ABNORMAL HIGH (ref 70–99)
Glucose-Capillary: 230 mg/dL — ABNORMAL HIGH (ref 70–99)
Glucose-Capillary: 257 mg/dL — ABNORMAL HIGH (ref 70–99)

## 2019-10-16 LAB — PHOSPHORUS: Phosphorus: 3.9 mg/dL (ref 2.5–4.6)

## 2019-10-16 LAB — PROTIME-INR
INR: 1.1 (ref 0.8–1.2)
Prothrombin Time: 14.2 seconds (ref 11.4–15.2)

## 2019-10-16 LAB — FERRITIN: Ferritin: 59 ng/mL (ref 11–307)

## 2019-10-16 LAB — FIBRINOGEN: Fibrinogen: 577 mg/dL — ABNORMAL HIGH (ref 210–475)

## 2019-10-16 LAB — MAGNESIUM: Magnesium: 2.3 mg/dL (ref 1.7–2.4)

## 2019-10-16 LAB — D-DIMER, QUANTITATIVE: D-Dimer, Quant: 0.84 ug/mL-FEU — ABNORMAL HIGH (ref 0.00–0.50)

## 2019-10-16 LAB — C-REACTIVE PROTEIN: CRP: 6.4 mg/dL — ABNORMAL HIGH (ref <1.0)

## 2019-10-16 MED ORDER — SODIUM CHLORIDE 0.9 % IV BOLUS
500.0000 mL | Freq: Once | INTRAVENOUS | Status: AC
  Administered 2019-10-16: 500 mL via INTRAVENOUS

## 2019-10-16 MED ORDER — POLYETHYLENE GLYCOL 3350 17 G PO PACK
17.0000 g | PACK | Freq: Every day | ORAL | Status: DC
  Administered 2019-10-16 – 2019-10-19 (×4): 17 g via ORAL
  Filled 2019-10-16 (×4): qty 1

## 2019-10-16 MED ORDER — DEXMEDETOMIDINE HCL IN NACL 400 MCG/100ML IV SOLN
0.4000 ug/kg/h | INTRAVENOUS | Status: DC
  Administered 2019-10-16: 0.4 ug/kg/h via INTRAVENOUS
  Administered 2019-10-16: 1 ug/kg/h via INTRAVENOUS
  Administered 2019-10-16 – 2019-10-17 (×2): 0.8 ug/kg/h via INTRAVENOUS
  Filled 2019-10-16 (×4): qty 100

## 2019-10-16 MED ORDER — SODIUM CHLORIDE 0.9 % IV SOLN
INTRAVENOUS | Status: DC
  Administered 2019-10-16 – 2019-10-17 (×3): via INTRAVENOUS

## 2019-10-16 NOTE — Progress Notes (Signed)
NAME:  Maria Bentley, MRN:  [id number], DOB:  [birth year], LOS: 3 ADMISSION DATE:  10/13/2019, CONSULTATION DATE: November 2 REFERRING MD: Dr. Jimmye Norman, CHIEF COMPLAINT: Confusion  Brief History   48 year old female with diabetes mellitus presented to the [facility] with DKA and COVID-19 pneumonia.  Transferred to [facility] and was intubated on arrival for inability to protect airway and hypoxemic respiratory failure.  Past Medical History  Diabetes mellitus Hypertension Hyperlipidemia Morbid obesity  Significant Hospital Events   November 2 admission November 3 prone position  Consults:  PCCM  Procedures:  November 2 endotracheal tube November 2 left subclavian central venous line  Significant Diagnostic Tests:  11/2 CT head > NAICP  Micro Data:  11/2 blood >   Antimicrobials:  11/2 remdesivir >   Interim history/subjective:   More awake today Prone overnight Oxygenation improved  Objective   Blood pressure (!) 100/59, pulse (!) 45, temperature 97.9 F (36.6 C), temperature source Axillary, resp. rate (!) 28, weight 100.6 kg, last menstrual period 05/09/2016, SpO2 99 %. CVP:  [11 mmHg-23 mmHg] 11 mmHg  Vent Mode: PRVC FiO2 (%):  [40 %] 40 % Set Rate:  [28 bmp] 28 bmp Vt Set:  [430 mL] 430 mL PEEP:  [10 cmH20] 10 cmH20 Plateau Pressure:  [25 cmH20-28 cmH20] 25 cmH20   Intake/Output Summary (Last 24 hours) at 10/16/2019 0757 Last data filed at 10/16/2019 0600 Gross per 24 hour  Intake 3780.33 ml  Output 245 ml  Net 3535.33 ml   Filed Weights   10/15/19 0444 10/16/19 0500  Weight: 97 kg 100.6 kg    Examination:  General:  In bed on vent HENT: NCAT ETT in place PULM: CTA B, vent supported breathing CV: RRR, no mgr GI: BS+, soft, nontender MSK: normal bulk and tone Neuro: sedated on vent   Resolved Hospital Problem list     Assessment & Plan:  Severe acute respiratory failure with hypoxemia due to COVID-19  pneumonia: Continue mechanical ventilation per ARDS protocol Target TVol 6-8cc/kgIBW Target Plateau Pressure < 30cm H20 Target driving pressure less than 15 cm of water Target PaO2 55-65: titrate PEEP/FiO2 per protocol As long as PaO2 to FiO2 ratio is less than 1:150 position in prone position for 16 hours a day Check CVP daily if CVL in place Target CVP less than 4, diurese as necessary Ventilator associated pneumonia prevention protocol 11/5 maintain in supine position, decrease sedation, attempt pressure support wean  Need for sedation/mechanical ventilation: Acute encepahlopathy due to DKA: improved on 11/3,  RASS target  RASS target 0 to -1 Stop propofol Start precedex Stop oxycodone and clonazepam   DKA: gap closed 11/3 Continue lantus and  Change to lantus and SSI today  Worsening AKI: worse Bolus crystalloid May need renal tomorrow Monitor BMET and UOP Replace electrolytes as needed   Best practice:  Diet: tube feeding Pain/Anxiety/Delirium protocol (if indicated): as above VAP protocol (if indicated): yes DVT prophylaxis: lovenox GI prophylaxis: Pantoprazole for stress ulcer prophylaxis Glucose control: per DKA protocol Mobility: bed rest Code Status: ful Family Communication: I updated her husband Jeneen Rinks today Disposition: remain in ICU  Labs   CBC: Recent Labs  Lab 10/13/19 0904  10/14/19 0509 10/14/19 1552 10/15/19 0505 10/15/19 1822 10/16/19 0500 10/16/19 0506  WBC 23.8*  --  11.9*  --  8.3  --  10.9*  --   NEUTROABS 20.0*  --  10.3*  --  7.5  --  8.7*  --  HGB 15.3*   < > 12.1 11.6* 10.6* 10.2* 10.6* 10.2*  HCT 52.2*   < > 39.7 34.0* 35.6* 30.0* 35.1* 30.0*  MCV 101.4*  --  97.3  --  99.7  --  99.2  --   PLT 761*  --  485*  --  372  --  372  --    < > = values in this interval not displayed.    Basic Metabolic Panel: Recent Labs  Lab 10/13/19 1534  10/13/19 2340  10/14/19 0509 10/14/19 1552 10/14/19 1611 10/15/19 0505 10/15/19 1700  10/15/19 1822 10/16/19 0500 10/16/19 0506  NA 156*   < > 157*   < > 154* 157*  --  149*  --  149* 148* 149*  K 4.1   < > 4.3   < > 3.7 3.7  --  4.7  --  3.8 3.7 3.6  CL 120*  --  127*  --  127*  --   --  121*  --   --  119*  --   CO2 20*  --  19*  --  18*  --   --  16*  --   --  17*  --   GLUCOSE 396*  --  219*  --  202*  --   --  188*  --   --  203*  --   BUN 56*  --  52*  --  52*  --   --  72*  --   --  84*  --   CREATININE 2.38*  --  2.03*  --  2.17*  --   --  2.94*  --   --  3.16*  --   CALCIUM 9.0  --  8.3*  --  8.1*  --   --  7.4*  --   --  7.9*  --   MG  --   --   --   --  2.0  --  1.8 1.7 1.6*  --  2.3  --   PHOS  --   --   --   --  1.2*  --  2.5 4.3 4.2  --  3.9  --    < > = values in this interval not displayed.   GFR: Estimated Creatinine Clearance: 24.9 mL/min (A) (by C-G formula based on SCr of 3.16 mg/dL (H)). Recent Labs  Lab 10/13/19 0904 10/14/19 0509 10/15/19 0505 10/16/19 0500  PROCALCITON 0.18  --   --   --   WBC 23.8* 11.9* 8.3 10.9*  LATICACIDVEN 1.7  --   --   --     Liver Function Tests: Recent Labs  Lab 10/13/19 0904 10/14/19 0509 10/15/19 0505 10/16/19 0500  AST 14* 11* 20 22  ALT 17 12 14 17   ALKPHOS 115 90 80 78  BILITOT 2.7* 0.4 0.5 0.5  PROT 8.0 5.6* 5.4* 5.3*  ALBUMIN 3.3* 2.2* 2.3* 2.1*   No results for input(s): LIPASE, AMYLASE in the last 168 hours. No results for input(s): AMMONIA in the last 168 hours.  ABG    Component Value Date/Time   PHART 7.338 (L) 10/16/2019 0506   PCO2ART 29.6 (L) 10/16/2019 0506   PO2ART 102.0 10/16/2019 0506   HCO3 15.8 (L) 10/16/2019 0506   TCO2 17 (L) 10/16/2019 0506   ACIDBASEDEF 9.0 (H) 10/16/2019 0506   O2SAT 98.0 10/16/2019 0506     Coagulation Profile: Recent Labs  Lab 10/16/19 0500  INR 1.1    Cardiac  Enzymes: No results for input(s): CKTOTAL, CKMB, CKMBINDEX, TROPONINI in the last 168 hours.  HbA1C: Hemoglobin A1C  Date/Time Value Ref Range Status  09/19/2019 04:45 PM 14.7 (A)  4.0 - 5.6 % Final  08/19/2018 03:33 PM 8.7 (A) 4.0 - 5.6 % Final   Hgb A1c MFr Bld  Date/Time Value Ref Range Status  04/19/2018 12:28 PM 11.5 (H) 4.6 - 6.5 % Final    Comment:    Glycemic Control Guidelines for People with Diabetes:Non Diabetic:  <6%Goal of Therapy: <7%Additional Action Suggested:  >8%   06/27/2016 09:34 AM 9.1 (H) 4.6 - 6.5 % Final    Comment:    Glycemic Control Guidelines for People with Diabetes:Non Diabetic:  <6%Goal of Therapy: <7%Additional Action Suggested:  >8%     CBG: Recent Labs  Lab 10/15/19 1212 10/15/19 1648 10/15/19 1938 10/15/19 2341 10/16/19 0434  GLUCAP 159* 188* 225* 257* 200*     Critical care time: 35 minutes    Roselie Awkward, MD [place] [facility] Pager: [phone] Cell: [phone] If no response, call [phone]

## 2019-10-16 NOTE — Progress Notes (Signed)
Patient's husband, Jeneen Rinks updated on patient's condition and questions answered. We will do a Facetime with him later in the day so he can see and talk to patient.

## 2019-10-16 NOTE — Progress Notes (Signed)
Facetimed with [employer], patient's husband and he talked to her. She opened her eyes to his voice and moved her arms.

## 2019-10-16 NOTE — Progress Notes (Signed)
Maria Bentley  LNL:[id number] DOB: [birth year] DOA: 10/13/2019 PCP: [name], MD    Brief Narrative:  48 y.o.femalewith a history ofpoorly-controlleddiabetes, recurrent rectal abscesses, morbid obesity s/p gastric bypass with vitamin D deficiency, depression and anxiety who presented to the ED at [facility] 11/2 after being found on thebedroomfloor by her husband. They both have covid-19, and he has been sick, nearly bed-ridden all weekend so is not aware of her last couple days. He had heard her getting up to the kitchen and bathroom regularly, not sure if she was taking medications as usual or not, though she struggles with that at times. This morning he wasn't able to get her off the floor, so called EMS. On arrival to the ED shefelt cool and appeared mottledwith hypothermia, sinus tachycardia and hypoxia requiring 2L O2. UA without signs of infection, but with leukocytosis treated as sepsis with bilateral CXR infiltrates on hypoexpanded film with vancomycin, cefepime, flagyl. pH initially 7.03, gap 27, gave 2L warm saline, and starting insulin gtt. HR 120, no hypotension, RR 25/min, 98% on 2L O2. Remainedconfused, removing mask, and on admission to [facility] ICU was obtunded and intubated on arrival.  Significant Events: 11/2 admit to [facility] via [facility] -intubated upon arrival at [facility] 11/4 cortrak placed   COVID-19 specific Treatment: Remdesivir 11/2 > Decadron 11/3 >  Subjective: Remains altered/sedated on ventilator.  Assessment & Plan:  Covid pneumonia -acute hypoxic respiratory failure Vent management per PCCM -continue remdesivir and Decadron - no convalescent plasma due to multiple allergies -no Actemra due to questionable septic picture  Uncontrolled DM2 with severe DKA A1c 14.7 -history of noncompliance with medications -CBG variable -follow without further change in treatment today  Acute kidney injury Appears to be related to hypovolemia with probable component of  ATN -the nurses have discovered an element of urinary retention today as well with approximately 900 cc of retained urine -Foley to be placed  Recent Labs  Lab 10/13/19 1534 10/13/19 2340 10/14/19 0509 10/15/19 0505 10/16/19 0500  CREATININE 2.38* 2.03* 2.17* 2.94* 3.16*    Acute metabolic encephalopathy Due to DKA and Covid -CT head negative -follow as sedation able to be weaned  Elevated troponin Has been evaluated by Cardiology -felt to be due to demand ischemia -no ischemic changes appreciated on EKG  Chronic sinus tachycardia Reportedly a longstanding issue -recent TSH 2.84  Migraine headaches  History of obesity status post gastric bypass - Estimated body mass index is 38.07 kg/m as calculated from the following:   Height as of 10/06/19: _0  (1.626 m).   Weight as of this encounter: 100.6 kg.  History of depression with anxiety  DVT prophylaxis: Lovenox Code Status: FULL CODE Family Communication: Per PCCM Disposition Plan: ICU  Consultants:  PCCM  Antimicrobials:  None  Objective: Blood pressure (!) 97/58, pulse (!) 52, temperature 97.9 F (36.6 C), temperature source Axillary, resp. rate (!) 28, weight 100.6 kg, last menstrual period 05/09/2016, SpO2 98 %.  Intake/Output Summary (Last 24 hours) at 10/16/2019 0853 Last data filed at 10/16/2019 0600 Gross per 24 hour  Intake 2890.47 ml  Output 185 ml  Net 2705.47 ml   Filed Weights   10/15/19 0444 10/16/19 0500  Weight: 97 kg 100.6 kg    Examination: General: Sedated on vent Lungs: Fine crackles diffusely -no wheezing bone Cardiovascular: RRR -no rub Abdomen: Soft, obese, bowel sounds hypoactive Extremities: Trace bilateral lower extremity edema without change  CBC: Recent Labs  Lab 10/14/19 0509  10/15/19 0505  10/15/19 1822 10/16/19 0500 10/16/19 0506  WBC 11.9*  --  8.3  --  10.9*  --   NEUTROABS 10.3*  --  7.5  --  8.7*  --   HGB 12.1   < > 10.6* 10.2* 10.6* 10.2*  HCT 39.7   < >  35.6* 30.0* 35.1* 30.0*  MCV 97.3  --  99.7  --  99.2  --   PLT 485*  --  372  --  372  --    < > = values in this interval not displayed.   Basic Metabolic Panel: Recent Labs  Lab 10/14/19 0509  10/15/19 0505 10/15/19 1700 10/15/19 1822 10/16/19 0500 10/16/19 0506  NA 154*   < > 149*  --  149* 148* 149*  K 3.7   < > 4.7  --  3.8 3.7 3.6  CL 127*  --  121*  --   --  119*  --   CO2 18*  --  16*  --   --  17*  --   GLUCOSE 202*  --  188*  --   --  203*  --   BUN 52*  --  72*  --   --  84*  --   CREATININE 2.17*  --  2.94*  --   --  3.16*  --   CALCIUM 8.1*  --  7.4*  --   --  7.9*  --   MG 2.0   < > 1.7 1.6*  --  2.3  --   PHOS 1.2*   < > 4.3 4.2  --  3.9  --    < > = values in this interval not displayed.   GFR: Estimated Creatinine Clearance: 24.9 mL/min (A) (by C-G formula based on SCr of 3.16 mg/dL (H)).  Liver Function Tests: Recent Labs  Lab 10/13/19 0904 10/14/19 0509 10/15/19 0505 10/16/19 0500  AST 14* 11* 20 22  ALT _0 ALKPHOS 115 90 80 78  BILITOT 2.7* 0.4 0.5 0.5  PROT 8.0 5.6* 5.4* 5.3*  ALBUMIN 3.3* 2.2* 2.3* 2.1*    HbA1C: Hemoglobin A1C  Date/Time Value Ref Range Status  09/19/2019 04:45 PM 14.7 (A) 4.0 - 5.6 % Final  08/19/2018 03:33 PM 8.7 (A) 4.0 - 5.6 % Final   Hgb A1c MFr Bld  Date/Time Value Ref Range Status  04/19/2018 12:28 PM 11.5 (H) 4.6 - 6.5 % Final    Comment:    Glycemic Control Guidelines for People with Diabetes:Non Diabetic:  <6%Goal of Therapy: <7%Additional Action Suggested:  >8%   06/27/2016 09:34 AM 9.1 (H) 4.6 - 6.5 % Final    Comment:    Glycemic Control Guidelines for People with Diabetes:Non Diabetic:  <6%Goal of Therapy: <7%Additional Action Suggested:  >8%     CBG: Recent Labs  Lab 10/15/19 1212 10/15/19 1648 10/15/19 1938 10/15/19 2341 10/16/19 0434  GLUCAP 159* 188* 225* 257* 200*    Recent Results (from the past 240 hour(s))  Blood Culture (routine x 2)     Status: None (Preliminary result)    Collection Time: 10/13/19  9:04 AM   Specimen: BLOOD  Result Value Ref Range Status   Specimen Description BLOOD LEFT [facility]  Final   Special Requests   Final    BOTTLES DRAWN AEROBIC AND ANAEROBIC Blood Culture adequate volume   Culture   Final    NO GROWTH 3 DAYS Performed at [facility], [address] [place], [place]  [address]    Report Status PENDING  Incomplete  Blood Culture (routine x 2)     Status: None (Preliminary result)   Collection Time: 10/13/19  9:04 AM   Specimen: BLOOD  Result Value Ref Range Status   Specimen Description BLOOD RIGHT FA  Final   Special Requests   Final    BOTTLES DRAWN AEROBIC AND ANAEROBIC Blood Culture adequate volume   Culture   Final    NO GROWTH 3 DAYS Performed at [facility], [address], [place], [place] [address]    Report Status PENDING  Incomplete  Urine Culture     Status: Abnormal   Collection Time: 10/13/19  9:04 AM   Specimen: Urine, Random  Result Value Ref Range Status   Specimen Description   Final    URINE, RANDOM Performed at [facility], [address], [place], [place] [address]    Special Requests   Final    NONE Performed at [facility], [address] [place], [place] [address]    Culture MULTIPLE SPECIES PRESENT, SUGGEST RECOLLECTION (A)  Final   Report Status 10/14/2019 FINAL  Final  MRSA PCR Screening     Status: None   Collection Time: 10/13/19 11:51 PM   Specimen: Nasal Mucosa; Nasopharyngeal  Result Value Ref Range Status   MRSA by PCR NEGATIVE NEGATIVE Final    Comment:        The GeneXpert MRSA Assay (FDA approved for NASAL specimens only), is one component of a comprehensive MRSA colonization surveillance program. It is not intended to diagnose MRSA infection nor to guide or monitor treatment for MRSA infections. Performed at [facility], [place] [address] [place], [place] [address]      Scheduled Meds: . chlorhexidine  gluconate (MEDLINE KIT)  15 mL Mouth Rinse BID  . Chlorhexidine Gluconate Cloth  6 each Topical Q0600  . clonazePAM  1 mg Per Tube QHS  . dexamethasone  6 mg Per Tube Daily  . feeding supplement (PRO-STAT SUGAR FREE 64)  30 mL Per Tube TID  . feeding supplement (VITAL AF 1.2 CAL)  1,000 mL Per Tube Q24H  . free water  200 mL Per Tube Q4H  . heparin injection (subcutaneous)  5,000 Units Subcutaneous Q8H  . insulin aspart  0-20 Units Subcutaneous Q4H  . insulin glargine  50 Units Subcutaneous Daily  . lamoTRIgine  150 mg Per Tube Daily  . mouth rinse  15 mL Mouth Rinse 10 times per day  . oxyCODONE  5 mg Per Tube Q6H  . pantoprazole sodium  40 mg Per Tube Q1200  . sodium chloride flush  3 mL Intravenous Q12H  . vitamin C  500 mg Per Tube Daily  . zinc sulfate  220 mg Per Tube Daily   Continuous Infusions: . sodium chloride Stopped (10/15/19 1828)  . fentaNYL infusion INTRAVENOUS 200 mcg/hr (10/16/19 0025)  . phenylephrine (NEO-SYNEPHRINE) Adult infusion 13 mcg/min (10/16/19 0751)  . propofol (DIPRIVAN) infusion 15 mcg/kg/min (10/16/19 0651)  . remdesivir 100 mg in NS 250 mL Stopped (10/15/19 1632)     LOS: 3 days   Cherene Altes, MD [facility] Office  [phone] Pager - Text Page per Amion  If 7PM-7AM, please contact night-coverage per Amion 10/16/2019, 8:53 AM

## 2019-10-17 ENCOUNTER — Inpatient Hospital Stay (HOSPITAL_COMMUNITY): Payer: BC Managed Care – PPO

## 2019-10-17 ENCOUNTER — Other Ambulatory Visit: Payer: Self-pay

## 2019-10-17 ENCOUNTER — Encounter (HOSPITAL_COMMUNITY): Payer: Self-pay

## 2019-10-17 DIAGNOSIS — N179 Acute kidney failure, unspecified: Secondary | ICD-10-CM

## 2019-10-17 LAB — C-REACTIVE PROTEIN: CRP: 8.1 mg/dL — ABNORMAL HIGH (ref <1.0)

## 2019-10-17 LAB — CBC WITH DIFFERENTIAL/PLATELET
Abs Immature Granulocytes: 0.14 10*3/uL — ABNORMAL HIGH (ref 0.00–0.07)
Basophils Absolute: 0 10*3/uL (ref 0.0–0.1)
Basophils Relative: 0 %
Eosinophils Absolute: 0 10*3/uL (ref 0.0–0.5)
Eosinophils Relative: 0 %
HCT: 36.1 % (ref 36.0–46.0)
Hemoglobin: 10.7 g/dL — ABNORMAL LOW (ref 12.0–15.0)
Immature Granulocytes: 2 %
Lymphocytes Relative: 13 %
Lymphs Abs: 1 10*3/uL (ref 0.7–4.0)
MCH: 29.9 pg (ref 26.0–34.0)
MCHC: 29.6 g/dL — ABNORMAL LOW (ref 30.0–36.0)
MCV: 100.8 fL — ABNORMAL HIGH (ref 80.0–100.0)
Monocytes Absolute: 0.3 10*3/uL (ref 0.1–1.0)
Monocytes Relative: 4 %
Neutro Abs: 6.4 10*3/uL (ref 1.7–7.7)
Neutrophils Relative %: 81 %
Platelets: 355 10*3/uL (ref 150–400)
RBC: 3.58 MIL/uL — ABNORMAL LOW (ref 3.87–5.11)
RDW: 15.8 % — ABNORMAL HIGH (ref 11.5–15.5)
WBC: 7.9 10*3/uL (ref 4.0–10.5)
nRBC: 1.1 % — ABNORMAL HIGH (ref 0.0–0.2)

## 2019-10-17 LAB — PROTIME-INR
INR: 1.1 (ref 0.8–1.2)
Prothrombin Time: 14.2 seconds (ref 11.4–15.2)

## 2019-10-17 LAB — POCT I-STAT 7, (LYTES, BLD GAS, ICA,H+H)
Acid-base deficit: 9 mmol/L — ABNORMAL HIGH (ref 0.0–2.0)
Bicarbonate: 18 mmol/L — ABNORMAL LOW (ref 20.0–28.0)
Calcium, Ion: 1.33 mmol/L (ref 1.15–1.40)
HCT: 30 % — ABNORMAL LOW (ref 36.0–46.0)
Hemoglobin: 10.2 g/dL — ABNORMAL LOW (ref 12.0–15.0)
O2 Saturation: 90 %
Patient temperature: 98.6
Potassium: 4.4 mmol/L (ref 3.5–5.1)
Sodium: 149 mmol/L — ABNORMAL HIGH (ref 135–145)
TCO2: 19 mmol/L — ABNORMAL LOW (ref 22–32)
pCO2 arterial: 42.1 mmHg (ref 32.0–48.0)
pH, Arterial: 7.238 — ABNORMAL LOW (ref 7.350–7.450)
pO2, Arterial: 70 mmHg — ABNORMAL LOW (ref 83.0–108.0)

## 2019-10-17 LAB — COMPREHENSIVE METABOLIC PANEL
ALT: 20 U/L (ref 0–44)
AST: 15 U/L (ref 15–41)
Albumin: 2 g/dL — ABNORMAL LOW (ref 3.5–5.0)
Alkaline Phosphatase: 75 U/L (ref 38–126)
Anion gap: 10 (ref 5–15)
BUN: 87 mg/dL — ABNORMAL HIGH (ref 6–20)
CO2: 18 mmol/L — ABNORMAL LOW (ref 22–32)
Calcium: 8.2 mg/dL — ABNORMAL LOW (ref 8.9–10.3)
Chloride: 120 mmol/L — ABNORMAL HIGH (ref 98–111)
Creatinine, Ser: 2.69 mg/dL — ABNORMAL HIGH (ref 0.44–1.00)
GFR calc Af Amer: 23 mL/min — ABNORMAL LOW (ref >60)
GFR calc non Af Amer: 20 mL/min — ABNORMAL LOW (ref >60)
Glucose, Bld: 293 mg/dL — ABNORMAL HIGH (ref 70–99)
Potassium: 4.5 mmol/L (ref 3.5–5.1)
Sodium: 148 mmol/L — ABNORMAL HIGH (ref 135–145)
Total Bilirubin: 0.8 mg/dL (ref 0.3–1.2)
Total Protein: 5.4 g/dL — ABNORMAL LOW (ref 6.5–8.1)

## 2019-10-17 LAB — PHOSPHORUS: Phosphorus: 5.1 mg/dL — ABNORMAL HIGH (ref 2.5–4.6)

## 2019-10-17 LAB — FERRITIN: Ferritin: 49 ng/mL (ref 11–307)

## 2019-10-17 LAB — GLUCOSE, CAPILLARY
Glucose-Capillary: 173 mg/dL — ABNORMAL HIGH (ref 70–99)
Glucose-Capillary: 186 mg/dL — ABNORMAL HIGH (ref 70–99)
Glucose-Capillary: 197 mg/dL — ABNORMAL HIGH (ref 70–99)
Glucose-Capillary: 235 mg/dL — ABNORMAL HIGH (ref 70–99)
Glucose-Capillary: 274 mg/dL — ABNORMAL HIGH (ref 70–99)
Glucose-Capillary: 292 mg/dL — ABNORMAL HIGH (ref 70–99)

## 2019-10-17 LAB — FIBRINOGEN: Fibrinogen: 706 mg/dL — ABNORMAL HIGH (ref 210–475)

## 2019-10-17 LAB — TRIGLYCERIDES: Triglycerides: 272 mg/dL — ABNORMAL HIGH (ref <150)

## 2019-10-17 LAB — D-DIMER, QUANTITATIVE: D-Dimer, Quant: 1.37 ug/mL-FEU — ABNORMAL HIGH (ref 0.00–0.50)

## 2019-10-17 LAB — MAGNESIUM: Magnesium: 2.2 mg/dL (ref 1.7–2.4)

## 2019-10-17 MED ORDER — FREE WATER
400.0000 mL | Status: DC
  Administered 2019-10-17 – 2019-10-21 (×24): 400 mL

## 2019-10-17 MED ORDER — INSULIN GLARGINE 100 UNIT/ML ~~LOC~~ SOLN
30.0000 [IU] | Freq: Two times a day (BID) | SUBCUTANEOUS | Status: DC
  Administered 2019-10-18 – 2019-10-20 (×6): 30 [IU] via SUBCUTANEOUS
  Filled 2019-10-17 (×8): qty 0.3

## 2019-10-17 MED ORDER — INSULIN GLARGINE 100 UNIT/ML ~~LOC~~ SOLN
30.0000 [IU] | Freq: Two times a day (BID) | SUBCUTANEOUS | Status: DC
  Filled 2019-10-17: qty 0.3

## 2019-10-17 MED ORDER — PNEUMOCOCCAL VAC POLYVALENT 25 MCG/0.5ML IJ INJ
0.5000 mL | INJECTION | INTRAMUSCULAR | Status: DC
  Filled 2019-10-17: qty 0.5

## 2019-10-17 MED ORDER — INFLUENZA VAC SPLIT QUAD 0.5 ML IM SUSY
0.5000 mL | PREFILLED_SYRINGE | INTRAMUSCULAR | Status: DC
  Filled 2019-10-17: qty 0.5

## 2019-10-17 MED ORDER — INSULIN GLARGINE 100 UNIT/ML ~~LOC~~ SOLN
10.0000 [IU] | Freq: Once | SUBCUTANEOUS | Status: AC
  Administered 2019-10-17: 10 [IU] via SUBCUTANEOUS
  Filled 2019-10-17: qty 0.1

## 2019-10-17 NOTE — Progress Notes (Signed)
PT Cancellation Note  Patient Details Name: Maria Bentley MRN: [id number] DOB: 1971/08/26   Cancelled Treatment:    Reason Eval/Treat Not Completed: Other (comment).  PT/OT orders received.  Unless pt moves to progressive care Sat or Sun, therapy will wait to Evaluate on Monday 11/9 (due to no ICU coverage on the weekend from PT/OT, but we do have coverage on the first floor progressive care).  If there is an urgent need, please call [phone].   Thanks,  Verdene Lennert, PT, DPT  Acute Rehabilitation [phone] pager [phone] office  @ [facility]: [phone]     Harvie Heck 10/17/2019, 4:14 PM

## 2019-10-17 NOTE — Evaluation (Signed)
Clinical/Bedside Swallow Evaluation Patient Details  Name: Maria Bentley MRN: [id number] Date of Birth: 13-Sep-1971  Today's Date: 10/17/2019 Time: SLP Start Time (ACUTE ONLY): 2 SLP Stop Time (ACUTE ONLY): 1630 SLP Time Calculation (min) (ACUTE ONLY): 15 min  Past Medical History:  Past Medical History:  Diagnosis Date  . Anemia   . Anxiety   . Depression 2010  . Diabetes mellitus   . Endometriosis   . Headache, common migraine, intractable   . History of ovarian cyst    left sided, caused by her endometriosis (possibly an endometrioma), in her 38s.  Marland Kitchen Hyperlipidemia   . Hypertension   . Kidney stones    left  . Morbid obesity ([place])    s/p gastric bypass  . Personal hx of gastric bypass 2010  . Rosacea   . Vertigo    Past Surgical History:  Past Surgical History:  Procedure Laterality Date  . DILATION AND CURETTAGE OF UTERUS  2011  . gastric bypass    . OVARIAN CYST REMOVAL    . ROTATOR CUFF REPAIR     right   HPI:  Pt is a 48 yo female admitted with DKA and COVID-19 requiring ETT 11/2-11/16. PMH includes obesity s/p gastric bypass, HTN, HLD, DM, anxiety, depression, endometriosis   Assessment / Plan / Recommendation Clinical Impression  Pt is at an increased risk for aspiration given her recent intubation and altered mentation. She is significantly hoarse, with phonation barely audible over a whisper. She will follow commands when attentive, but still needs cues. Echolalia noted intermittently, as are reduced eye contact, delayed responses, and inappropriate laughter. She initially has limited awareness of ice chips in her mouth, but this improves as trials continue. She coughs consistently with ice chips, suggestive of decreased airway protection, and her cough sounds quite weak.   I think she has good potential to resume POs with additional time given the relative brevity of her intubation along with her young age. Fortunately she already has a cortrak placed,  so she has alternative access for nutrition and medications until she is ready for POs. Staff could also administer a few, single pieces of ice after oral care in order to help mobilize some of her secretions and increase moisture in her pharynx/oral cavity. If coughing were to subside with ice chips, nursing could offer more than a few pieces at a time, but would not advance beyond ice chips until SLP can re-evaluate on Monday.  SLP Visit Diagnosis: Dysphagia, unspecified (R13.10)    Aspiration Risk  Moderate aspiration risk    Diet Recommendation NPO;Alternative means - temporary;Other (Comment)(few, single ice chips after oral care)   Medication Administration: Via alternative means    Other  Recommendations Oral Care Recommendations: Oral care QID Other Recommendations: Have oral suction available   Follow up Recommendations (tba)      Frequency and Duration min 2x/week  2 weeks       Prognosis Prognosis for Safe Diet Advancement: Good Barriers to Reach Goals: Cognitive deficits      Swallow Study   General HPI: Pt is a 48 yo female admitted with DKA and COVID-19 requiring ETT 11/2-11/16. PMH includes obesity s/p gastric bypass, HTN, HLD, DM, anxiety, depression, endometriosis Type of Study: Bedside Swallow Evaluation Previous Swallow Assessment: none in chart Diet Prior to this Study: NPO;NG Tube Temperature Spikes Noted: No Respiratory Status: Nasal cannula History of Recent Intubation: Yes Length of Intubations (days): 4 days Date extubated: 10/17/19 Behavior/Cognition: Alert;Cooperative;Distractible;Requires cueing Oral Cavity  Assessment: Dry Oral Care Completed by SLP: Yes Oral Cavity - Dentition: Adequate natural dentition Self-Feeding Abilities: Total assist Patient Positioning: Upright in bed Baseline Vocal Quality: Hoarse Volitional Cough: Weak;Congested    Oral/Motor/Sensory Function Overall Oral Motor/Sensory Function: Generalized oral weakness   Ice  Chips Ice chips: Impaired Presentation: Spoon Oral Phase Impairments: Poor awareness of bolus Pharyngeal Phase Impairments: Cough - Immediate   Thin Liquid Thin Liquid: Not tested    Nectar Thick Nectar Thick Liquid: Not tested   Honey Thick Honey Thick Liquid: Not tested   Puree Puree: Not tested   Solid     Solid: Not tested      Venita Sheffield Ahmed Inniss 10/17/2019,4:50 PM  Pollyann Glen, M.A. [place] Acute Environmental education officer [phone] Office [phone]

## 2019-10-17 NOTE — Progress Notes (Signed)
Maria Bentley  QAS:[id number] DOB: 1971-11-17 DOA: 10/13/2019 PCP: [name], MD    Brief Narrative:  48 y.o.femalewith a history ofpoorly-controlleddiabetes, recurrent rectal abscesses, morbid obesity s/p gastric bypass with vitamin D deficiency, depression and anxiety who presented to the ED at [facility] 11/2 after being found on thebedroomfloor by her husband. They both have covid-19, and he has been sick, nearly bed-ridden all weekend so is not aware of her last couple days. He had heard her getting up to the kitchen and bathroom regularly, not sure if she was taking medications as usual or not, though she struggles with that at times. This morning he wasn't able to get her off the floor, so called EMS. On arrival to the ED shefelt cool and appeared mottledwith hypothermia, sinus tachycardia and hypoxia requiring 2L O2. UA without signs of infection, but with leukocytosis treated as sepsis with bilateral CXR infiltrates on hypoexpanded film with vancomycin, cefepime, flagyl. pH initially 7.03, gap 27, gave 2L warm saline, and starting insulin gtt. HR 120, no hypotension, RR 25/min, 98% on 2L O2. Remainedconfused, removing mask, and on admission to [facility] ICU was obtunded and intubated on arrival.  Significant Events: 11/2 admit to [facility] via [facility] -intubated upon arrival at [facility] 11/4 cortrak placed  11/6 extubated  COVID-19 specific Treatment: Remdesivir 11/2 > 11/6 Decadron 11/3 >  Subjective: Much more alert today.  Interactive.  Examined prior to extubation.  Follows simple commands.  Assessment & Plan:  Covid pneumonia -acute hypoxic respiratory failure Successfully extubated today -continue to monitor in ICU setting - continue decadron -has completed the course of remdesivir - no convalescent plasma due to multiple allergies -no Actemra due to questionable septic picture -utilize HFNC post extubation  Uncontrolled DM2 with severe DKA A1c 14.7 -history of  noncompliance with medications -CBG improving -adjust treatment plan again today and follow with a goal of CBG consistently less than 180  Acute kidney injury Appears to be related to hypovolemia with probable component of ATN, as well as urinary retention (900 cc of retained urine) -Foley to remain until creatinine further improved  Recent Labs  Lab 10/13/19 2340 10/14/19 0509 10/15/19 0505 10/16/19 0500 10/17/19 0540  CREATININE 2.03* 2.17* 2.94* 3.16* 2.69*    Acute metabolic encephalopathy Due to DKA and Covid -CT head negative -slowly improving -anticipate another 24-48 hours will be required for patient to clear further  Elevated troponin Was evaluated by Cardiology -felt to be due to demand ischemia -no ischemic changes appreciated on EKG  Chronic sinus tachycardia Reportedly a longstanding issue - recent TSH 2.84  Migraine headaches  History of obesity status post gastric bypass - Estimated body mass index is 38.07 kg/m as calculated from the following:   Height as of this encounter: 5' 4"  (1.626 m).   Weight as of this encounter: 100.6 kg.  History of depression with anxiety  DVT prophylaxis: per COVID-PACT  Code Status: FULL CODE Family Communication: Per PCCM Disposition Plan: ICU -probable transfer 11/7  Consultants:  PCCM  Antimicrobials:  None  Objective: Blood pressure 116/72, pulse (!) 104, temperature 97.7 F (36.5 C), temperature source Oral, resp. rate 14, height 5' 4"  (1.626 m), weight 100.6 kg, last menstrual period 05/09/2016, SpO2 96 %.  Intake/Output Summary (Last 24 hours) at 10/17/2019 1528 Last data filed at 10/17/2019 1200 Gross per 24 hour  Intake 3949.83 ml  Output 2690 ml  Net 1259.83 ml   Filed Weights   10/15/19 0444 10/16/19 0500 10/16/19 1930  Weight: 97 kg 100.6 kg 100.6 kg    Examination: General: Improved responsiveness with no acute respiratory distress Lungs: Fine crackles diffusely without wheezing Cardiovascular:  RRR without rub or appreciable murmur Abdomen: Soft, obese, bowel sounds hypoactive Extremities: Trace bilateral lower extremity edema which is stable  CBC: Recent Labs  Lab 10/15/19 0505  10/16/19 0500 10/16/19 0506 10/17/19 0540 10/17/19 0550  WBC 8.3  --  10.9*  --  7.9  --   NEUTROABS 7.5  --  8.7*  --  6.4  --   HGB 10.6*   < > 10.6* 10.2* 10.7* 10.2*  HCT 35.6*   < > 35.1* 30.0* 36.1 30.0*  MCV 99.7  --  99.2  --  100.8*  --   PLT 372  --  372  --  355  --    < > = values in this interval not displayed.   Basic Metabolic Panel: Recent Labs  Lab 10/15/19 0505 10/15/19 1700  10/16/19 0500 10/16/19 0506 10/17/19 0540 10/17/19 0550  NA 149*  --    < > 148* 149* 148* 149*  K 4.7  --    < > 3.7 3.6 4.5 4.4  CL 121*  --   --  119*  --  120*  --   CO2 16*  --   --  17*  --  18*  --   GLUCOSE 188*  --   --  203*  --  293*  --   BUN 72*  --   --  84*  --  87*  --   CREATININE 2.94*  --   --  3.16*  --  2.69*  --   CALCIUM 7.4*  --   --  7.9*  --  8.2*  --   MG 1.7 1.6*  --  2.3  --  2.2  --   PHOS 4.3 4.2  --  3.9  --  5.1*  --    < > = values in this interval not displayed.   GFR: Estimated Creatinine Clearance: 29.2 mL/min (A) (by C-G formula based on SCr of 2.69 mg/dL (H)).  Liver Function Tests: Recent Labs  Lab 10/14/19 0509 10/15/19 0505 10/16/19 0500 10/17/19 0540  AST 11* 20 22 15   ALT 12 14 17 20   ALKPHOS 90 80 78 75  BILITOT 0.4 0.5 0.5 0.8  PROT 5.6* 5.4* 5.3* 5.4*  ALBUMIN 2.2* 2.3* 2.1* 2.0*    HbA1C: Hemoglobin A1C  Date/Time Value Ref Range Status  09/19/2019 04:45 PM 14.7 (A) 4.0 - 5.6 % Final  08/19/2018 03:33 PM 8.7 (A) 4.0 - 5.6 % Final   Hgb A1c MFr Bld  Date/Time Value Ref Range Status  04/19/2018 12:28 PM 11.5 (H) 4.6 - 6.5 % Final    Comment:    Glycemic Control Guidelines for People with Diabetes:Non Diabetic:  <6%Goal of Therapy: <7%Additional Action Suggested:  >8%   06/27/2016 09:34 AM 9.1 (H) 4.6 - 6.5 % Final    Comment:     Glycemic Control Guidelines for People with Diabetes:Non Diabetic:  <6%Goal of Therapy: <7%Additional Action Suggested:  >8%     CBG: Recent Labs  Lab 10/16/19 1938 10/17/19 0004 10/17/19 0414 10/17/19 0716 10/17/19 1204  GLUCAP 217* 292* 274* 235* 197*    Recent Results (from the past 240 hour(s))  Blood Culture (routine x 2)     Status: None (Preliminary result)   Collection Time: 10/13/19  9:04 AM   Specimen: BLOOD  Result  Value Ref Range Status   Specimen Description BLOOD LEFT AC  Final   Special Requests   Final    BOTTLES DRAWN AEROBIC AND ANAEROBIC Blood Culture adequate volume   Culture   Final    NO GROWTH 3 DAYS Performed at [facility], [address], [place], [place] [address]    Report Status PENDING  Incomplete  Blood Culture (routine x 2)     Status: None (Preliminary result)   Collection Time: 10/13/19  9:04 AM   Specimen: BLOOD  Result Value Ref Range Status   Specimen Description BLOOD RIGHT FA  Final   Special Requests   Final    BOTTLES DRAWN AEROBIC AND ANAEROBIC Blood Culture adequate volume   Culture   Final    NO GROWTH 3 DAYS Performed at [facility], [address] [place], [place] [address]    Report Status PENDING  Incomplete  Urine Culture     Status: Abnormal   Collection Time: 10/13/19  9:04 AM   Specimen: Urine, Random  Result Value Ref Range Status   Specimen Description   Final    URINE, RANDOM Performed at [facility], [address] [place], [place] [address]    Special Requests   Final    NONE Performed at [facility], [address], [place], [place] [address]    Culture MULTIPLE SPECIES PRESENT, SUGGEST RECOLLECTION (A)  Final   Report Status 10/14/2019 FINAL  Final  MRSA PCR Screening     Status: None   Collection Time: 10/13/19 11:51 PM   Specimen: Nasal Mucosa; Nasopharyngeal  Result Value Ref Range Status   MRSA by PCR NEGATIVE NEGATIVE Final    Comment:        The  GeneXpert MRSA Assay (FDA approved for NASAL specimens only), is one component of a comprehensive MRSA colonization surveillance program. It is not intended to diagnose MRSA infection nor to guide or monitor treatment for MRSA infections. Performed at [facility], [place] [address] [place], [place] [address]      Scheduled Meds: . chlorhexidine gluconate (MEDLINE KIT)  15 mL Mouth Rinse BID  . Chlorhexidine Gluconate Cloth  6 each Topical Q0600  . clonazePAM  1 mg Per Tube QHS  . dexamethasone  6 mg Per Tube Daily  . feeding supplement (VITAL AF 1.2 CAL)  1,000 mL Per Tube Q24H  . free water  200 mL Per Tube Q4H  . heparin injection (subcutaneous)  5,000 Units Subcutaneous Q8H  . insulin aspart  0-20 Units Subcutaneous Q4H  . insulin glargine  50 Units Subcutaneous Daily  . lamoTRIgine  150 mg Per Tube Daily  . mouth rinse  15 mL Mouth Rinse 10 times per day  . oxyCODONE  5 mg Per Tube Q6H  . pantoprazole sodium  40 mg Per Tube Q1200  . polyethylene glycol  17 g Oral Daily  . sodium chloride flush  3 mL Intravenous Q12H  . vitamin C  500 mg Per Tube Daily  . zinc sulfate  220 mg Per Tube Daily     LOS: 4 days   Cherene Altes, MD [facility] Office  [phone] Pager - Text Page per Amion  If 7PM-7AM, please contact night-coverage per Amion 10/17/2019, 3:28 PM

## 2019-10-17 NOTE — Progress Notes (Signed)
NAME:  Maria Bentley, MRN:  [id number], DOB:  [birth year], LOS: 4 ADMISSION DATE:  10/13/2019, CONSULTATION DATE: November 2 REFERRING MD: Dr. Jimmye Norman, CHIEF COMPLAINT: Confusion  Brief History   48 year old female with diabetes mellitus presented to the [facility] with DKA and COVID-19 pneumonia.  Transferred to [facility] and was intubated on arrival for inability to protect airway and hypoxemic respiratory failure.  Past Medical History  Diabetes mellitus Hypertension Hyperlipidemia Morbid obesity  Significant Hospital Events   November 2 admission November 3 prone position  Consults:  PCCM  Procedures:  November 2 endotracheal tube > 11/6 November 2 left subclavian central venous line > 11/6  Significant Diagnostic Tests:  11/2 CT head > NAICP  Micro Data:  11/2 blood >   Antimicrobials:  11/2 remdesivir >   Interim history/subjective:   More awake Passing SBT  Objective   Blood pressure 103/62, pulse 64, temperature 98.4 F (36.9 C), temperature source Oral, resp. rate 20, height 5\' 4"  (1.626 m), weight 100.6 kg, last menstrual period 05/09/2016, SpO2 93 %. CVP:  [4 mmHg-22 mmHg] 8 mmHg  Vent Mode: PRVC FiO2 (%):  [40 %] 40 % Set Rate:  [20 bmp-28 bmp] 20 bmp Vt Set:  [430 mL] 430 mL PEEP:  [5 cmH20-10 cmH20] 5 cmH20 Plateau Pressure:  [19 cmH20-24 cmH20] 22 cmH20   Intake/Output Summary (Last 24 hours) at 10/17/2019 [id number] Last data filed at 10/17/2019 [id number] Gross per 24 hour  Intake 4583.55 ml  Output 3025 ml  Net 1558.55 ml   Filed Weights   10/15/19 0444 10/16/19 0500 10/16/19 1930  Weight: 97 kg 100.6 kg 100.6 kg    Examination:  General:  In bed on vent HENT: NCAT ETT in place PULM: CTA B, vent supported breathing CV: RRR, no mgr GI: BS+, soft, nontender MSK: normal bulk and tone Neuro: awake on vent, nods head to questions  11/6 CXR > severe bilateral airspace disease, tubes and lines in place   Resolved Hospital Problem list     Assessment & Plan:  Severe acute respiratory failure with hypoxemia due to COVID-19 pneumonia: Worsening bilateral infiltrates: consider acute pulmonary edema Extubation today Aspiration precautions Administer O2 for O2 saturation > 85% Monitor carefully in ICU Consider lasix if oxygenation or respiratory status worsens KVO fluids  Need for sedation/mechanical ventilation: Acute encepahlopathy due to DKA: improved on 11/3,  D/C sedation protocol  DKA: gap closed 11/3 Continue lantus and SSI  Worsening AKI/bladder retention, both improved post foley catheter Monitor BMET and UOP Replace electrolytes as needed   Best practice:  Diet: tube feeding Pain/Anxiety/Delirium protocol (if indicated): d/c VAP protocol (if indicated): d/c DVT prophylaxis: heparin per COVID PACT trial GI prophylaxis: Pantoprazole for stress ulcer prophylaxis Glucose control: per DKA protocol Mobility: bed rest Code Status: full Family Communication: I updated her husband Jeneen Rinks by phone today Disposition: remain in ICU  Labs   CBC: Recent Labs  Lab 10/13/19 0904  10/14/19 0509  10/15/19 0505 10/15/19 1822 10/16/19 0500 10/16/19 0506 10/17/19 0540 10/17/19 0550  WBC 23.8*  --  11.9*  --  8.3  --  10.9*  --  7.9  --   NEUTROABS 20.0*  --  10.3*  --  7.5  --  8.7*  --  6.4  --   HGB 15.3*   < > 12.1   < > 10.6* 10.2* 10.6* 10.2* 10.7* 10.2*  HCT 52.2*   < > 39.7   < > 35.6*  30.0* 35.1* 30.0* 36.1 30.0*  MCV 101.4*  --  97.3  --  99.7  --  99.2  --  100.8*  --   PLT 761*  --  485*  --  372  --  372  --  355  --    < > = values in this interval not displayed.    Basic Metabolic Panel: Recent Labs  Lab 10/13/19 2340  10/14/19 0509  10/14/19 1611 10/15/19 0505 10/15/19 1700 10/15/19 1822 10/16/19 0500 10/16/19 0506 10/17/19 0540 10/17/19 0550  NA 157*   < > 154*   < >  --  149*  --  149* 148* 149* 148* 149*  K 4.3   < > 3.7   < >  --  4.7  --  3.8  3.7 3.6 4.5 4.4  CL 127*  --  127*  --   --  121*  --   --  119*  --  120*  --   CO2 19*  --  18*  --   --  16*  --   --  17*  --  18*  --   GLUCOSE 219*  --  202*  --   --  188*  --   --  203*  --  293*  --   BUN 52*  --  52*  --   --  72*  --   --  84*  --  87*  --   CREATININE 2.03*  --  2.17*  --   --  2.94*  --   --  3.16*  --  2.69*  --   CALCIUM 8.3*  --  8.1*  --   --  7.4*  --   --  7.9*  --  8.2*  --   MG  --    < > 2.0  --  1.8 1.7 1.6*  --  2.3  --  2.2  --   PHOS  --    < > 1.2*  --  2.5 4.3 4.2  --  3.9  --  5.1*  --    < > = values in this interval not displayed.   GFR: Estimated Creatinine Clearance: 29.2 mL/min (A) (by C-G formula based on SCr of 2.69 mg/dL (H)). Recent Labs  Lab 10/13/19 0904 10/14/19 0509 10/15/19 0505 10/16/19 0500 10/17/19 0540  PROCALCITON 0.18  --   --   --   --   WBC 23.8* 11.9* 8.3 10.9* 7.9  LATICACIDVEN 1.7  --   --   --   --     Liver Function Tests: Recent Labs  Lab 10/13/19 0904 10/14/19 0509 10/15/19 0505 10/16/19 0500 10/17/19 0540  AST 14* 11* 20 22 15   ALT 17 12 14 17 20   ALKPHOS 115 90 80 78 75  BILITOT 2.7* 0.4 0.5 0.5 0.8  PROT 8.0 5.6* 5.4* 5.3* 5.4*  ALBUMIN 3.3* 2.2* 2.3* 2.1* 2.0*   No results for input(s): LIPASE, AMYLASE in the last 168 hours. No results for input(s): AMMONIA in the last 168 hours.  ABG    Component Value Date/Time   PHART 7.238 (L) 10/17/2019 0550   PCO2ART 42.1 10/17/2019 0550   PO2ART 70.0 (L) 10/17/2019 0550   HCO3 18.0 (L) 10/17/2019 0550   TCO2 19 (L) 10/17/2019 0550   ACIDBASEDEF 9.0 (H) 10/17/2019 0550   O2SAT 90.0 10/17/2019 0550     Coagulation Profile: Recent Labs  Lab 10/16/19 0500 10/17/19 0540  INR  1.1 1.1    Cardiac Enzymes: No results for input(s): CKTOTAL, CKMB, CKMBINDEX, TROPONINI in the last 168 hours.  HbA1C: Hemoglobin A1C  Date/Time Value Ref Range Status  09/19/2019 04:45 PM 14.7 (A) 4.0 - 5.6 % Final  08/19/2018 03:33 PM 8.7 (A) 4.0 - 5.6 % Final    Hgb A1c MFr Bld  Date/Time Value Ref Range Status  04/19/2018 12:28 PM 11.5 (H) 4.6 - 6.5 % Final    Comment:    Glycemic Control Guidelines for People with Diabetes:Non Diabetic:  <6%Goal of Therapy: <7%Additional Action Suggested:  >8%   06/27/2016 09:34 AM 9.1 (H) 4.6 - 6.5 % Final    Comment:    Glycemic Control Guidelines for People with Diabetes:Non Diabetic:  <6%Goal of Therapy: <7%Additional Action Suggested:  >8%     CBG: Recent Labs  Lab 10/16/19 1614 10/16/19 1938 10/17/19 0004 10/17/19 0414 10/17/19 0716  GLUCAP 230* 217* 292* 274* 235*     Critical care time: 40 minutes    Roselie Awkward, MD [place] P[facility]M Pager: [phone] Cell: [phone] If no response, call [phone]

## 2019-10-17 NOTE — Progress Notes (Addendum)
0730: Assumed care of Pt from night RN. Pt awake and following commands.   0815: Pt assessed, VSS, denies pain. Foley for retention, CL for hemodynamic monitoring. Peri and foley care complete, repositioned, plan for possible extubation today, will monitor.   1110: RT extubated Pt without any immediate complications. Pt speaking hoarsly, but has weak cough. Encouraging Pt to cough frequently. 95% on 6 L [place], slightly tachycardic now 100's-1teens.   1250: updated Spouse over phone. All questions answered. Will try to set up video chat later.   1600: Pt assessed, cough remains weak, but Pt alert, answering questions, following commands. Denies pain. SpO2 90% on 3 L [place]. Foley removed because bag was leaking, purewick in place. Repositioned, suggested video chatting spouse, but Pt said no she didn't want to at the moment. will monitor.  1800: MD made aware of HR 120's, no new orders at this time. Repositioned, will monitor

## 2019-10-17 NOTE — Procedures (Signed)
Extubation Procedure Note  Patient Details:   Name: Maria Bentley DOB: 09/29/1971 MRN: [id number]   Airway Documentation:    Vent end date: 10/17/19 Vent end time: 1110   Evaluation  O2 sats: stable throughout Complications: No apparent complications Patient did tolerate procedure well. Bilateral Breath Sounds: Rhonchi, Diminished   Yes   Pt extubated to 5L HFNC per MD order. Pt stable throughout with no complications. Pt able to speak but unable/unwilling to cough post extubation. Pt had positive cuff leak noted prior to extubation. No stridor heard, no distress, no increased WOB. Pt with "blank stare" MD aware.   Jesse Sans 10/17/2019, 1:08 PM

## 2019-10-18 LAB — COMPREHENSIVE METABOLIC PANEL
ALT: 24 U/L (ref 0–44)
AST: 19 U/L (ref 15–41)
Albumin: 2.3 g/dL — ABNORMAL LOW (ref 3.5–5.0)
Alkaline Phosphatase: 98 U/L (ref 38–126)
Anion gap: 10 (ref 5–15)
BUN: 79 mg/dL — ABNORMAL HIGH (ref 6–20)
CO2: 22 mmol/L (ref 22–32)
Calcium: 8.8 mg/dL — ABNORMAL LOW (ref 8.9–10.3)
Chloride: 119 mmol/L — ABNORMAL HIGH (ref 98–111)
Creatinine, Ser: 2.37 mg/dL — ABNORMAL HIGH (ref 0.44–1.00)
GFR calc Af Amer: 27 mL/min — ABNORMAL LOW (ref >60)
GFR calc non Af Amer: 23 mL/min — ABNORMAL LOW (ref >60)
Glucose, Bld: 125 mg/dL — ABNORMAL HIGH (ref 70–99)
Potassium: 4.7 mmol/L (ref 3.5–5.1)
Sodium: 151 mmol/L — ABNORMAL HIGH (ref 135–145)
Total Bilirubin: 0.3 mg/dL (ref 0.3–1.2)
Total Protein: 6.1 g/dL — ABNORMAL LOW (ref 6.5–8.1)

## 2019-10-18 LAB — CBC WITH DIFFERENTIAL/PLATELET
Abs Immature Granulocytes: 0.35 10*3/uL — ABNORMAL HIGH (ref 0.00–0.07)
Basophils Absolute: 0.1 10*3/uL (ref 0.0–0.1)
Basophils Relative: 0 %
Eosinophils Absolute: 0.1 10*3/uL (ref 0.0–0.5)
Eosinophils Relative: 1 %
HCT: 43.1 % (ref 36.0–46.0)
Hemoglobin: 12.1 g/dL (ref 12.0–15.0)
Immature Granulocytes: 2 %
Lymphocytes Relative: 9 %
Lymphs Abs: 1.5 10*3/uL (ref 0.7–4.0)
MCH: 29.4 pg (ref 26.0–34.0)
MCHC: 28.1 g/dL — ABNORMAL LOW (ref 30.0–36.0)
MCV: 104.9 fL — ABNORMAL HIGH (ref 80.0–100.0)
Monocytes Absolute: 1.1 10*3/uL — ABNORMAL HIGH (ref 0.1–1.0)
Monocytes Relative: 6 %
Neutro Abs: 14.2 10*3/uL — ABNORMAL HIGH (ref 1.7–7.7)
Neutrophils Relative %: 82 %
Platelets: 469 10*3/uL — ABNORMAL HIGH (ref 150–400)
RBC: 4.11 MIL/uL (ref 3.87–5.11)
RDW: 16.6 % — ABNORMAL HIGH (ref 11.5–15.5)
WBC: 17.2 10*3/uL — ABNORMAL HIGH (ref 4.0–10.5)
nRBC: 0.9 % — ABNORMAL HIGH (ref 0.0–0.2)

## 2019-10-18 LAB — GLUCOSE, CAPILLARY
Glucose-Capillary: 107 mg/dL — ABNORMAL HIGH (ref 70–99)
Glucose-Capillary: 107 mg/dL — ABNORMAL HIGH (ref 70–99)
Glucose-Capillary: 108 mg/dL — ABNORMAL HIGH (ref 70–99)
Glucose-Capillary: 151 mg/dL — ABNORMAL HIGH (ref 70–99)
Glucose-Capillary: 154 mg/dL — ABNORMAL HIGH (ref 70–99)
Glucose-Capillary: 154 mg/dL — ABNORMAL HIGH (ref 70–99)

## 2019-10-18 LAB — CULTURE, BLOOD (ROUTINE X 2)
Culture: NO GROWTH
Special Requests: ADEQUATE

## 2019-10-18 LAB — PHOSPHORUS: Phosphorus: 6.6 mg/dL — ABNORMAL HIGH (ref 2.5–4.6)

## 2019-10-18 LAB — C-REACTIVE PROTEIN: CRP: 6.8 mg/dL — ABNORMAL HIGH (ref <1.0)

## 2019-10-18 LAB — D-DIMER, QUANTITATIVE: D-Dimer, Quant: 2.05 ug/mL-FEU — ABNORMAL HIGH (ref 0.00–0.50)

## 2019-10-18 LAB — MAGNESIUM: Magnesium: 2.3 mg/dL (ref 1.7–2.4)

## 2019-10-18 LAB — PROTIME-INR
INR: 1.1 (ref 0.8–1.2)
Prothrombin Time: 13.7 seconds (ref 11.4–15.2)

## 2019-10-18 LAB — VITAMIN B12: Vitamin B-12: 525 pg/mL (ref 180–914)

## 2019-10-18 LAB — FERRITIN: Ferritin: 52 ng/mL (ref 11–307)

## 2019-10-18 LAB — FIBRINOGEN: Fibrinogen: >800 mg/dL — ABNORMAL HIGH (ref 210–475)

## 2019-10-18 MED ORDER — INFLUENZA VAC SPLIT QUAD 0.5 ML IM SUSY
0.5000 mL | PREFILLED_SYRINGE | INTRAMUSCULAR | Status: AC | PRN
  Filled 2019-10-18: qty 0.5

## 2019-10-18 MED ORDER — LORAZEPAM 2 MG/ML IJ SOLN
2.0000 mg | Freq: Once | INTRAMUSCULAR | Status: AC
  Administered 2019-10-18: 2 mg via INTRAVENOUS
  Filled 2019-10-18: qty 1

## 2019-10-18 MED ORDER — HALOPERIDOL LACTATE 5 MG/ML IJ SOLN
5.0000 mg | Freq: Four times a day (QID) | INTRAMUSCULAR | Status: DC | PRN
  Administered 2019-10-18 – 2019-10-19 (×2): 5 mg via INTRAVENOUS
  Filled 2019-10-18 (×2): qty 1

## 2019-10-18 MED ORDER — PNEUMOCOCCAL VAC POLYVALENT 25 MCG/0.5ML IJ INJ
0.5000 mL | INJECTION | INTRAMUSCULAR | Status: AC | PRN
  Filled 2019-10-18: qty 0.5

## 2019-10-18 MED ORDER — ESCITALOPRAM OXALATE 20 MG PO TABS
20.0000 mg | ORAL_TABLET | Freq: Every day | ORAL | Status: DC
  Administered 2019-10-18 – 2019-10-25 (×8): 20 mg
  Filled 2019-10-18 (×11): qty 1

## 2019-10-18 MED ORDER — CLONIDINE HCL 0.1 MG PO TABS
0.1000 mg | ORAL_TABLET | Freq: Three times a day (TID) | ORAL | Status: DC
  Administered 2019-10-18 – 2019-10-20 (×7): 0.1 mg
  Filled 2019-10-18 (×7): qty 1

## 2019-10-18 MED ORDER — FUROSEMIDE 10 MG/ML IJ SOLN
40.0000 mg | Freq: Once | INTRAMUSCULAR | Status: AC
  Administered 2019-10-18: 40 mg via INTRAVENOUS
  Filled 2019-10-18: qty 4

## 2019-10-18 NOTE — Progress Notes (Signed)
RN aware to remove CVC per order.

## 2019-10-18 NOTE — Progress Notes (Signed)
Maria Bentley  [id number] DOB: [birth year] DOA: 10/13/2019 PCP: [name], MD    Brief Narrative:  48 y.o.femalewith a history ofpoorly-controlleddiabetes, recurrent rectal abscesses, morbid obesity s/p gastric bypass with vitamin D deficiency, depression and anxiety who presented to the ED at [facility] 11/2 after being found on thebedroomfloor by her husband. They both have covid-19, and he has been sick, nearly bed-ridden all weekend so is not aware of her last couple days. He had heard her getting up to the kitchen and bathroom regularly, not sure if she was taking medications as usual or not, though she struggles with that at times. This morning he wasn't able to get her off the floor, so called EMS. On arrival to the ED shefelt cool and appeared mottledwith hypothermia, sinus tachycardia and hypoxia requiring 2L O2. UA without signs of infection, but with leukocytosis treated as sepsis with bilateral CXR infiltrates on hypoexpanded film with vancomycin, cefepime, flagyl. pH initially 7.03, gap 27, gave 2L warm saline, and starting insulin gtt. HR 120, no hypotension, RR 25/min, 98% on 2L O2. Remainedconfused, removing mask, and on admission to [facility] ICU was obtunded and intubated on arrival.  Significant Events: 11/2 admit to [facility] via [facility] -intubated upon arrival at [facility] 11/4 cortrak placed  11/6 extubated  COVID-19 specific Treatment: Remdesivir 11/2 > 11/6 Decadron 11/3 >  Subjective: Remains encephalopathic/confused.  Extubated successfully yesterday with stable respiratory status.  Unable to provide review of systems at time of my evaluation.  Assessment & Plan:  Covid pneumonia -acute hypoxic respiratory failure Successfully extubated 11/6 -continue to monitor in ICU setting - continue decadron -has completed a course of remdesivir - no convalescent plasma due to multiple allergies -no Actemra due to questionable septic picture -wean oxygen as able   Uncontrolled DM2 with severe DKA A1c 14.7 -history of noncompliance with medications -CBG now well controlled -no change in treatment plan today  Acute kidney injury Appears to be related to hypovolemia with probable component of ATN, as well as urinary retention (900 cc of retained urine) -renal function improved today  Recent Labs  Lab 10/14/19 0509 10/15/19 0505 10/16/19 0500 10/17/19 0540 10/18/19 0634  CREATININE 2.17* 2.94* 3.16* 2.69* 2.37*    Acute metabolic encephalopathy Due to DKA and Covid -CT head negative -slowly improving -anticipate another 24-48 hours will be required for patient to clear further -attempting to avoid sedatives  Elevated troponin Was evaluated by Cardiology -felt to be due to demand ischemia -no ischemic changes appreciated on EKG  Chronic sinus tachycardia Reportedly a longstanding issue - recent TSH 2.84  Migraine headaches  History of obesity status post gastric bypass - Estimated body mass index is 37.84 kg/m as calculated from the following:   Height as of this encounter: '5\' 4"'$  (1.626 m).   Weight as of this encounter: 100 kg.  History of depression with anxiety  DVT prophylaxis: per COVID-PACT  Code Status: FULL CODE Family Communication: Per PCCM Disposition Plan: ICU   Consultants:  PCCM  Antimicrobials:  None  Objective: Blood pressure 134/65, pulse (!) 109, temperature 97.8 F (36.6 C), temperature source Oral, resp. rate 14, height '5\' 4"'$  (1.626 m), weight 100 kg, last menstrual period 05/09/2016, SpO2 97 %.  Intake/Output Summary (Last 24 hours) at 10/18/2019 0911 Last data filed at 10/18/2019 0741 Gross per 24 hour  Intake 3099.86 ml  Output 2155 ml  Net 944.86 ml   Filed Weights   10/16/19 0500 10/16/19 1930 10/17/19 1200  Weight: 100.6  kg 100.6 kg 100 kg    Examination: General: sedate - no acute respiratory distress Lungs: no acute resp distress  Cardiovascular: tachycardia  Abdomen:  Extremities:    CBC: Recent Labs  Lab 10/16/19 0500  10/17/19 0540 10/17/19 0550 10/18/19 0634  WBC 10.9*  --  7.9  --  17.2*  NEUTROABS 8.7*  --  6.4  --  14.2*  HGB 10.6*   < > 10.7* 10.2* 12.1  HCT 35.1*   < > 36.1 30.0* 43.1  MCV 99.2  --  100.8*  --  104.9*  PLT 372  --  355  --  469*   < > = values in this interval not displayed.   Basic Metabolic Panel: Recent Labs  Lab 10/16/19 0500  10/17/19 0540 10/17/19 0550 10/18/19 0634  NA 148*   < > 148* 149* 151*  K 3.7   < > 4.5 4.4 4.7  CL 119*  --  120*  --  119*  CO2 17*  --  18*  --  22  GLUCOSE 203*  --  293*  --  125*  BUN 84*  --  87*  --  79*  CREATININE 3.16*  --  2.69*  --  2.37*  CALCIUM 7.9*  --  8.2*  --  8.8*  MG 2.3  --  2.2  --  2.3  PHOS 3.9  --  5.1*  --  6.6*   < > = values in this interval not displayed.   GFR: Estimated Creatinine Clearance: 33 mL/min (A) (by C-G formula based on SCr of 2.37 mg/dL (H)).  Liver Function Tests: Recent Labs  Lab 10/15/19 0505 10/16/19 0500 10/17/19 0540 10/18/19 0634  AST '20 22 15 19  '$ ALT '14 17 20 24  '$ ALKPHOS 80 78 75 98  BILITOT 0.5 0.5 0.8 0.3  PROT 5.4* 5.3* 5.4* 6.1*  ALBUMIN 2.3* 2.1* 2.0* 2.3*    HbA1C: Hemoglobin A1C  Date/Time Value Ref Range Status  09/19/2019 04:45 PM 14.7 (A) 4.0 - 5.6 % Final  08/19/2018 03:33 PM 8.7 (A) 4.0 - 5.6 % Final   Hgb A1c MFr Bld  Date/Time Value Ref Range Status  04/19/2018 12:28 PM 11.5 (H) 4.6 - 6.5 % Final    Comment:    Glycemic Control Guidelines for People with Diabetes:Non Diabetic:  <6%Goal of Therapy: <7%Additional Action Suggested:  >8%   06/27/2016 09:34 AM 9.1 (H) 4.6 - 6.5 % Final    Comment:    Glycemic Control Guidelines for People with Diabetes:Non Diabetic:  <6%Goal of Therapy: <7%Additional Action Suggested:  >8%     CBG: Recent Labs  Lab 10/17/19 1638 10/17/19 1917 10/17/19 2350 10/18/19 0341 10/18/19 0740  GLUCAP 173* 186* 154* 151* 107*    Recent Results (from the past 240 hour(s))  Blood  Culture (routine x 2)     Status: None   Collection Time: 10/13/19  9:04 AM   Specimen: BLOOD  Result Value Ref Range Status   Specimen Description   Final    BLOOD LEFT [facility] Performed at [facility], [address] [place], [place] [address]    Special Requests   Final    BOTTLES DRAWN AEROBIC AND ANAEROBIC Blood Culture adequate volume Performed at [facility], [address], [place], [place] [address]    Culture   Final    NO GROWTH 5 DAYS Performed at [facility], [place] [address], [place], [place] [address]    Report  Status 10/18/2019 FINAL  Final  Blood Culture (routine x 2)     Status: None (Preliminary result)   Collection Time: 10/13/19  9:04 AM   Specimen: BLOOD  Result Value Ref Range Status   Specimen Description BLOOD RIGHT FA  Final   Special Requests   Final    BOTTLES DRAWN AEROBIC AND ANAEROBIC Blood Culture adequate volume   Culture   Final    NO GROWTH 3 DAYS Performed at [facility], [address], [place], [place] [address]    Report Status PENDING  Incomplete  Urine Culture     Status: Abnormal   Collection Time: 10/13/19  9:04 AM   Specimen: Urine, Random  Result Value Ref Range Status   Specimen Description   Final    URINE, RANDOM Performed at [facility], [address] [place], [place] [address]    Special Requests   Final    NONE Performed at [facility], [address], [place], [place] [address]    Culture MULTIPLE SPECIES PRESENT, SUGGEST RECOLLECTION (A)  Final   Report Status 10/14/2019 FINAL  Final  MRSA PCR Screening     Status: None   Collection Time: 10/13/19 11:51 PM   Specimen: Nasal Mucosa; Nasopharyngeal  Result Value Ref Range Status   MRSA by PCR NEGATIVE NEGATIVE Final    Comment:        The GeneXpert MRSA Assay (FDA approved for NASAL specimens only), is one component of a comprehensive MRSA colonization surveillance program. It is not intended to diagnose  MRSA infection nor to guide or monitor treatment for MRSA infections. Performed at [facility], [place] [address], [place], [place] [address]      Scheduled Meds: . chlorhexidine gluconate (MEDLINE KIT)  15 mL Mouth Rinse BID  . Chlorhexidine Gluconate Cloth  6 each Topical Q0600  . clonazePAM  1 mg Per Tube QHS  . dexamethasone  6 mg Per Tube Daily  . feeding supplement (VITAL AF 1.2 CAL)  1,000 mL Per Tube Q24H  . free water  400 mL Per Tube Q4H  . heparin injection (subcutaneous)  5,000 Units Subcutaneous Q8H  . influenza vac split quadrivalent PF  0.5 mL Intramuscular Tomorrow-1000  . insulin aspart  0-20 Units Subcutaneous Q4H  . insulin glargine  30 Units Subcutaneous BID  . lamoTRIgine  150 mg Per Tube Daily  . mouth rinse  15 mL Mouth Rinse 10 times per day  . oxyCODONE  5 mg Per Tube Q6H  . pantoprazole sodium  40 mg Per Tube Q1200  . pneumococcal 23 valent vaccine  0.5 mL Intramuscular Tomorrow-1000  . polyethylene glycol  17 g Oral Daily  . sodium chloride flush  3 mL Intravenous Q12H  . vitamin C  500 mg Per Tube Daily  . zinc sulfate  220 mg Per Tube Daily     LOS: 5 days   Cherene Altes, MD [facility] Office  [phone] Pager - Text Page per Amion  If 7PM-7AM, please contact night-coverage per Amion 10/18/2019, 9:11 AM

## 2019-10-18 NOTE — Progress Notes (Signed)
NAME:  Maria Bentley, MRN:  [id number], DOB:  1971/11/28, LOS: 5 ADMISSION DATE:  10/13/2019, CONSULTATION DATE: November 2 REFERRING MD: Dr. Jimmye Norman, CHIEF COMPLAINT: Confusion  Brief History   48 year old female with diabetes mellitus presented to the [facility] with DKA and COVID-19 pneumonia.  Transferred to [facility] and was intubated on arrival for inability to protect airway and hypoxemic respiratory failure.  Past Medical History  Diabetes mellitus Hypertension Hyperlipidemia Morbid obesity  Significant Hospital Events   November 2 admission November 3 prone position  Consults:  PCCM  Procedures:  November 2 endotracheal tube > 11/6 November 2 left subclavian central venous line > 11/6  Significant Diagnostic Tests:  11/2 CT head > NAICP  Micro Data:  11/2 blood >   Antimicrobials:  11/2 remdesivir >   Interim history/subjective:   Successfully extubated yesterday.  Remains somnolent as received heavy dose of sedation yesterday  Objective   Blood pressure 134/65, pulse (!) 109, temperature 97.8 F (36.6 C), temperature source Oral, resp. rate 14, height 5\' 4"  (1.626 m), weight 100 kg, last menstrual period 05/09/2016, SpO2 97 %. CVP:  [8 mmHg] 8 mmHg      Intake/Output Summary (Last 24 hours) at 10/18/2019 1158 Last data filed at 10/18/2019 0741 Gross per 24 hour  Intake 2850 ml  Output 1915 ml  Net 935 ml   Filed Weights   10/16/19 0500 10/16/19 1930 10/17/19 1200  Weight: 100.6 kg 100.6 kg 100 kg    Examination:  General: Morbidly obese woman in bed in no distress.  Not intubated on no sedative infusions. HENT: Eyes track appropriately without icterus. PULM: On high flow cannula.  No respiratory distress.  Chest clear to auscultation. CV: RRR, no mgr, JVP not visualized.  Remedies warm and well-perfused. GI: BS+, soft, nontender MSK: normal bulk and tone Neuro: Appears stuporous, eyes open and follows  basic commands but only moans.    Resolved Hospital Problem list     Assessment & Plan:   Was critically ill due to severe acute respiratory failure with hypoxemia due to COVID-19 pneumonia Tolerated extubation yesterday -We will diurese as in positive fluid balance, this will facilitate weaning oxygen. -Once more awake commence progressive ambulation.  Acute encepahlopathy due to DKA: improved on 11/3,  -I have stopped all sedatives.  DKA: gap closed 11/3 Continue lantus and SSI  Worsening AKI/bladder retention, both improved post foley catheter Monitor BMET and UOP Replace electrolytes as needed   Best practice:  Diet: tube feeding Pain/Anxiety/Delirium protocol (if indicated): d/c VAP protocol (if indicated): d/c DVT prophylaxis: heparin per COVID PACT trial GI prophylaxis: Pantoprazole for stress ulcer prophylaxis Glucose control: per DKA protocol Mobility: bed rest Code Status: full Family Communication: Dr. [place] Bells updated her husband Jeneen Rinks by phone 11/6. Disposition: remain in ICU  Labs   CBC: Recent Labs  Lab 10/14/19 0509  10/15/19 0505  10/16/19 0500 10/16/19 0506 10/17/19 0540 10/17/19 0550 10/18/19 0634  WBC 11.9*  --  8.3  --  10.9*  --  7.9  --  17.2*  NEUTROABS 10.3*  --  7.5  --  8.7*  --  6.4  --  14.2*  HGB 12.1   < > 10.6*   < > 10.6* 10.2* 10.7* 10.2* 12.1  HCT 39.7   < > 35.6*   < > 35.1* 30.0* 36.1 30.0* 43.1  MCV 97.3  --  99.7  --  99.2  --  100.8*  --  104.9*  PLT 485*  --  372  --  372  --  355  --  469*   < > = values in this interval not displayed.    Basic Metabolic Panel: Recent Labs  Lab 10/14/19 0509  10/15/19 0505 10/15/19 1700  10/16/19 0500 10/16/19 0506 10/17/19 0540 10/17/19 0550 10/18/19 0634  NA 154*   < > 149*  --    < > 148* 149* 148* 149* 151*  K 3.7   < > 4.7  --    < > 3.7 3.6 4.5 4.4 4.7  CL 127*  --  121*  --   --  119*  --  120*  --  119*  CO2 18*  --  16*  --   --  17*  --  18*  --  22  GLUCOSE  202*  --  188*  --   --  203*  --  293*  --  125*  BUN 52*  --  72*  --   --  84*  --  87*  --  79*  CREATININE 2.17*  --  2.94*  --   --  3.16*  --  2.69*  --  2.37*  CALCIUM 8.1*  --  7.4*  --   --  7.9*  --  8.2*  --  8.8*  MG 2.0   < > 1.7 1.6*  --  2.3  --  2.2  --  2.3  PHOS 1.2*   < > 4.3 4.2  --  3.9  --  5.1*  --  6.6*   < > = values in this interval not displayed.   GFR: Estimated Creatinine Clearance: 33 mL/min (A) (by C-G formula based on SCr of 2.37 mg/dL (H)). Recent Labs  Lab 10/13/19 0904  10/15/19 0505 10/16/19 0500 10/17/19 0540 10/18/19 0634  PROCALCITON 0.18  --   --   --   --   --   WBC 23.8*   < > 8.3 10.9* 7.9 17.2*  LATICACIDVEN 1.7  --   --   --   --   --    < > = values in this interval not displayed.    Liver Function Tests: Recent Labs  Lab 10/14/19 0509 10/15/19 0505 10/16/19 0500 10/17/19 0540 10/18/19 0634  AST 11* 20 22 15 19   ALT 12 14 17 20 24   ALKPHOS 90 80 78 75 98  BILITOT 0.4 0.5 0.5 0.8 0.3  PROT 5.6* 5.4* 5.3* 5.4* 6.1*  ALBUMIN 2.2* 2.3* 2.1* 2.0* 2.3*   No results for input(s): LIPASE, AMYLASE in the last 168 hours. No results for input(s): AMMONIA in the last 168 hours.  ABG    Component Value Date/Time   PHART 7.238 (L) 10/17/2019 0550   PCO2ART 42.1 10/17/2019 0550   PO2ART 70.0 (L) 10/17/2019 0550   HCO3 18.0 (L) 10/17/2019 0550   TCO2 19 (L) 10/17/2019 0550   ACIDBASEDEF 9.0 (H) 10/17/2019 0550   O2SAT 90.0 10/17/2019 0550     Coagulation Profile: Recent Labs  Lab 10/16/19 0500 10/17/19 0540 10/18/19 0634  INR 1.1 1.1 1.1    Cardiac Enzymes: No results for input(s): CKTOTAL, CKMB, CKMBINDEX, TROPONINI in the last 168 hours.  HbA1C: Hemoglobin A1C  Date/Time Value Ref Range Status  09/19/2019 04:45 PM 14.7 (A) 4.0 - 5.6 % Final  08/19/2018 03:33 PM 8.7 (A) 4.0 - 5.6 % Final   Hgb A1c MFr Bld  Date/Time Value Ref Range Status  04/19/2018  12:28 PM 11.5 (H) 4.6 - 6.5 % Final    Comment:    Glycemic  Control Guidelines for People with Diabetes:Non Diabetic:  <6%Goal of Therapy: <7%Additional Action Suggested:  >8%   06/27/2016 09:34 AM 9.1 (H) 4.6 - 6.5 % Final    Comment:    Glycemic Control Guidelines for People with Diabetes:Non Diabetic:  <6%Goal of Therapy: <7%Additional Action Suggested:  >8%     CBG: Recent Labs  Lab 10/17/19 1638 10/17/19 1917 10/17/19 2350 10/18/19 0341 10/18/19 0740  GLUCAP [place] Virgen Belland, MD [facility] ICU Physician [place]  Pager: [phone] Mobile: [phone] After hours: [phone].

## 2019-10-19 LAB — GLUCOSE, CAPILLARY
Glucose-Capillary: 108 mg/dL — ABNORMAL HIGH (ref 70–99)
Glucose-Capillary: 148 mg/dL — ABNORMAL HIGH (ref 70–99)
Glucose-Capillary: 150 mg/dL — ABNORMAL HIGH (ref 70–99)
Glucose-Capillary: 179 mg/dL — ABNORMAL HIGH (ref 70–99)
Glucose-Capillary: 202 mg/dL — ABNORMAL HIGH (ref 70–99)
Glucose-Capillary: 242 mg/dL — ABNORMAL HIGH (ref 70–99)

## 2019-10-19 LAB — BASIC METABOLIC PANEL
Anion gap: 11 (ref 5–15)
BUN: 75 mg/dL — ABNORMAL HIGH (ref 6–20)
CO2: 22 mmol/L (ref 22–32)
Calcium: 9.1 mg/dL (ref 8.9–10.3)
Chloride: 120 mmol/L — ABNORMAL HIGH (ref 98–111)
Creatinine, Ser: 2.06 mg/dL — ABNORMAL HIGH (ref 0.44–1.00)
GFR calc Af Amer: 32 mL/min — ABNORMAL LOW (ref >60)
GFR calc non Af Amer: 28 mL/min — ABNORMAL LOW (ref >60)
Glucose, Bld: 145 mg/dL — ABNORMAL HIGH (ref 70–99)
Potassium: 4.3 mmol/L (ref 3.5–5.1)
Sodium: 153 mmol/L — ABNORMAL HIGH (ref 135–145)

## 2019-10-19 LAB — PROTIME-INR
INR: 1.1 (ref 0.8–1.2)
Prothrombin Time: 13.6 seconds (ref 11.4–15.2)

## 2019-10-19 LAB — FIBRINOGEN: Fibrinogen: 847 mg/dL — ABNORMAL HIGH (ref 210–475)

## 2019-10-19 MED ORDER — POLYETHYLENE GLYCOL 3350 17 G PO PACK
17.0000 g | PACK | Freq: Two times a day (BID) | ORAL | Status: DC
  Administered 2019-10-19 – 2019-10-24 (×4): 17 g via ORAL
  Filled 2019-10-19 (×6): qty 1

## 2019-10-19 MED ORDER — SENNOSIDES 8.8 MG/5ML PO SYRP
5.0000 mL | ORAL_SOLUTION | Freq: Two times a day (BID) | ORAL | Status: DC
  Administered 2019-10-19 – 2019-10-24 (×4): 5 mL
  Filled 2019-10-19 (×14): qty 5

## 2019-10-19 MED ORDER — SENNOSIDES 8.8 MG/5ML PO SYRP: 5.0000 mL | ORAL_SOLUTION | Freq: Two times a day (BID) | ORAL | Status: DC

## 2019-10-19 NOTE — Progress Notes (Signed)
NAME:  Maria Bentley, MRN:  [id number], DOB:  [birth year], LOS: 6 ADMISSION DATE:  10/13/2019, CONSULTATION DATE: November 2 REFERRING MD: Dr. Jimmye Norman, CHIEF COMPLAINT: Confusion  Brief History   48 year old female with diabetes mellitus presented to the [facility] with DKA and COVID-19 pneumonia.  Transferred to [facility] and was intubated on arrival for inability to protect airway and hypoxemic respiratory failure.  Past Medical History  Diabetes mellitus Hypertension Hyperlipidemia Morbid obesity  Significant Hospital Events   November 2 admission November 3 prone position  Consults:  PCCM  Procedures:  November 2 endotracheal tube > 11/6 November 2 left subclavian central venous line > 11/6  Significant Diagnostic Tests:  11/2 CT head > NAICP  Micro Data:  11/2 blood >   Antimicrobials:  11/2 remdesivir >   Interim history/subjective:   Successfully extubated 2 days ago.  Improving somnolence.  Generally awake and interactive but remains impulsive at times.  Objective   Blood pressure (!) 149/82, pulse (!) 122, temperature 98 F (36.7 C), temperature source Oral, resp. rate 20, height 5\' 4"  (1.626 m), weight 100 kg, last menstrual period 05/09/2016, SpO2 96 %.        Intake/Output Summary (Last 24 hours) at 10/19/2019 1016 Last data filed at 10/19/2019 0800 Gross per 24 hour  Intake 1440 ml  Output 4500 ml  Net -3060 ml   Filed Weights   10/16/19 0500 10/16/19 1930 10/17/19 1200  Weight: 100.6 kg 100.6 kg 100 kg    Examination:  General: Morbidly obese woman in bed in no distress.  Not intubated on no sedative infusions. HENT: Eyes track appropriately without icterus. PULM: On high flow cannula.  No respiratory distress.  Chest clear to auscultation. CV: RRR, no mgr, JVP not visualized.  Remedies warm and well-perfused. GI: BS+, soft, nontender MSK: normal bulk and tone Neuro: Awake and follows commands  responses are slow.  Strength appears reasonable.  Resolved Hospital Problem list     Assessment & Plan:   Was critically ill due to severe acute respiratory failure with hypoxemia due to COVID-19 pneumonia Tolerated extubation 48 hours ago Good response to diuretic -We will continue to diurese orally diurese as in positive fluid balance, this will facilitate weaning oxygen. -Ready to commence commence progressive ambulation.  Acute encepahlopathy due to DKA: improved on 11/3,  -I have stopped all sedatives.  DKA: gap closed 11/3 Continue lantus and SSI  Worsening AKI/bladder retention, both improved post foley catheter Monitor BMET and UOP Replace electrolytes as needed   Best practice:  Diet: tube feeding until swallowing reevaluated.   Pain/Anxiety/Delirium protocol (if indicated): d/c VAP protocol (if indicated): d/c DVT prophylaxis: heparin per COVID PACT trial GI prophylaxis: Pantoprazole for stress ulcer prophylaxis Glucose control: per DKA protocol Mobility: bed rest Code Status: full Family Communication: Dr. Doyne Keel tried updating her husband 11/8, no answer, will try again later.   Disposition: Ready for transfer.  Labs   CBC: Recent Labs  Lab 10/14/19 0509  10/15/19 0505  10/16/19 0500 10/16/19 0506 10/17/19 0540 10/17/19 0550 10/18/19 0634  WBC 11.9*  --  8.3  --  10.9*  --  7.9  --  17.2*  NEUTROABS 10.3*  --  7.5  --  8.7*  --  6.4  --  14.2*  HGB 12.1   < > 10.6*   < > 10.6* 10.2* 10.7* 10.2* 12.1  HCT 39.7   < > 35.6*   < > 35.1* 30.0* 36.1  30.0* 43.1  MCV 97.3  --  99.7  --  99.2  --  100.8*  --  104.9*  PLT 485*  --  372  --  372  --  355  --  469*   < > = values in this interval not displayed.    Basic Metabolic Panel: Recent Labs  Lab 10/15/19 0505 10/15/19 1700  10/16/19 0500 10/16/19 0506 10/17/19 0540 10/17/19 0550 10/18/19 0634 10/19/19 0540  NA 149*  --    < > 148* 149* 148* 149* 151* 153*  K 4.7  --    < > 3.7 3.6 4.5 4.4  4.7 4.3  CL 121*  --   --  119*  --  120*  --  119* 120*  CO2 16*  --   --  17*  --  18*  --  22 22  GLUCOSE 188*  --   --  203*  --  293*  --  125* 145*  BUN 72*  --   --  84*  --  87*  --  79* 75*  CREATININE 2.94*  --   --  3.16*  --  2.69*  --  2.37* 2.06*  CALCIUM 7.4*  --   --  7.9*  --  8.2*  --  8.8* 9.1  MG 1.7 1.6*  --  2.3  --  2.2  --  2.3  --   PHOS 4.3 4.2  --  3.9  --  5.1*  --  6.6*  --    < > = values in this interval not displayed.   GFR: Estimated Creatinine Clearance: 38 mL/min (A) (by C-G formula based on SCr of 2.06 mg/dL (H)). Recent Labs  Lab 10/13/19 0904  10/15/19 0505 10/16/19 0500 10/17/19 0540 10/18/19 0634  PROCALCITON 0.18  --   --   --   --   --   WBC 23.8*   < > 8.3 10.9* 7.9 17.2*  LATICACIDVEN 1.7  --   --   --   --   --    < > = values in this interval not displayed.    Liver Function Tests: Recent Labs  Lab 10/14/19 0509 10/15/19 0505 10/16/19 0500 10/17/19 0540 10/18/19 0634  AST 11* 20 22 15 19   ALT 12 14 17 20 24   ALKPHOS 90 80 78 75 98  BILITOT 0.4 0.5 0.5 0.8 0.3  PROT 5.6* 5.4* 5.3* 5.4* 6.1*  ALBUMIN 2.2* 2.3* 2.1* 2.0* 2.3*   No results for input(s): LIPASE, AMYLASE in the last 168 hours. No results for input(s): AMMONIA in the last 168 hours.  ABG    Component Value Date/Time   PHART 7.238 (L) 10/17/2019 0550   PCO2ART 42.1 10/17/2019 0550   PO2ART 70.0 (L) 10/17/2019 0550   HCO3 18.0 (L) 10/17/2019 0550   TCO2 19 (L) 10/17/2019 0550   ACIDBASEDEF 9.0 (H) 10/17/2019 0550   O2SAT 90.0 10/17/2019 0550     Coagulation Profile: Recent Labs  Lab 10/16/19 0500 10/17/19 0540 10/18/19 0634  INR 1.1 1.1 1.1    Cardiac Enzymes: No results for input(s): CKTOTAL, CKMB, CKMBINDEX, TROPONINI in the last 168 hours.  HbA1C: Hemoglobin A1C  Date/Time Value Ref Range Status  09/19/2019 04:45 PM 14.7 (A) 4.0 - 5.6 % Final  08/19/2018 03:33 PM 8.7 (A) 4.0 - 5.6 % Final   Hgb A1c MFr Bld  Date/Time Value Ref Range  Status  04/19/2018 12:28 PM 11.5 (H) 4.6 - 6.5 %  Final    Comment:    Glycemic Control Guidelines for People with Diabetes:Non Diabetic:  <6%Goal of Therapy: <7%Additional Action Suggested:  >8%   06/27/2016 09:34 AM 9.1 (H) 4.6 - 6.5 % Final    Comment:    Glycemic Control Guidelines for People with Diabetes:Non Diabetic:  <6%Goal of Therapy: <7%Additional Action Suggested:  >8%     CBG: Recent Labs  Lab 10/18/19 1653 10/18/19 2008 10/18/19 2352 10/19/19 0318 10/19/19 0751  GLUCAP 108* 154* 179* 150* 108*   35 minutes spent with greater than 50% of time in counseling and coordination of care.  Kipp Brood, MD [facility] ICU Physician [place]  Pager: [phone] Mobile: [phone] After hours: [phone].

## 2019-10-19 NOTE — Progress Notes (Signed)
Spoke with Korey RN re d/c CVC, RN aware, PIV started.

## 2019-10-19 NOTE — Progress Notes (Signed)
Maria Bentley  [id number] DOB: [birth year] DOA: 10/13/2019 PCP: [name], MD    Brief Narrative:  48 y.o.femalewith a history ofpoorly-controlleddiabetes, recurrent rectal abscesses, morbid obesity s/p gastric bypass with vitamin D deficiency, depression and anxiety who presented to the ED at [facility] 11/2 after being found on thebedroomfloor by her husband. They both have covid-19, and he has been sick, nearly bed-ridden all weekend so is not aware of her last couple days. He had heard her getting up to the kitchen and bathroom regularly, not sure if she was taking medications as usual or not, though she struggles with that at times. This morning he wasn't able to get her off the floor, so called EMS. On arrival to the ED shefelt cool and appeared mottledwith hypothermia, sinus tachycardia and hypoxia requiring 2L O2. UA without signs of infection, but with leukocytosis treated as sepsis with bilateral CXR infiltrates on hypoexpanded film with vancomycin, cefepime, flagyl. pH initially 7.03, gap 27, gave 2L warm saline, and starting insulin gtt. HR 120, no hypotension, RR 25/min, 98% on 2L O2. Remainedconfused, removing mask, and on admission to [facility] ICU was obtunded and intubated on arrival.  Significant Events: 11/2 admit to [facility] via [facility] -intubated upon arrival at [facility] 11/4 cortrak placed  11/6 extubated  COVID-19 specific Treatment: Remdesivir 11/2 > 11/6 Decadron 11/3 >  Subjective: Making progress in weaning of high flow nasal cannula.  CBGs much better controlled.  Much more alert though does remain confused and unable to provide a reliable history.  Assessment & Plan:  Covid pneumonia -acute hypoxic respiratory failure Successfully extubated 11/6 - continue decadron -has completed a course of remdesivir - no convalescent plasma due to multiple allergies -no Actemra due to questionable septic picture at presentation - wean oxygen as able  Uncontrolled DM2  with severe DKA A1c 14.7 -history of noncompliance with medications -CBG now well controlled -no change in treatment plan today  Acute kidney injury Appears to be related to hypovolemia with probable component of ATN, as well as urinary retention (900 cc of retained urine) -renal function appears to be consistently improving at this time  Recent Labs  Lab 10/15/19 0505 10/16/19 0500 10/17/19 0540 10/18/19 0634 10/19/19 0540  CREATININE 2.94* 3.16* 2.69* 2.37* 2.06*    Acute metabolic encephalopathy Due to DKA and Covid -CT head negative -slowly improving - attempting to avoid sedatives  Elevated troponin Was evaluated by Cardiology -felt to be due to demand ischemia -no ischemic changes appreciated on EKG  Chronic sinus tachycardia Reportedly a longstanding issue - recent TSH 2.84 -appears to have responded favorably to clonidine  Migraine headaches  History of obesity status post gastric bypass - Estimated body mass index is 37.84 kg/m as calculated from the following:   Height as of this encounter: 5\' 4"  (1.626 m).   Weight as of this encounter: 100 kg.  History of depression with anxiety  DVT prophylaxis: per COVID-PACT  Code Status: FULL CODE Family Communication: Per PCCM Disposition Plan: Transfer to medical bed  Consultants:  PCCM  Antimicrobials:  None  Objective: Blood pressure (!) 149/82, pulse (!) 122, temperature 98 F (36.7 C), temperature source Oral, resp. rate 20, height 5\' 4"  (1.626 m), weight 100 kg, last menstrual period 05/09/2016, SpO2 96 %.  Intake/Output Summary (Last 24 hours) at 10/19/2019 1018 Last data filed at 10/19/2019 0800 Gross per 24 hour  Intake 1440 ml  Output 4500 ml  Net -3060 ml   [employer]  10/16/19 0500 10/16/19 1930 10/17/19 1200  Weight: 100.6 kg 100.6 kg 100 kg    Examination: General: More alert but confused -no respiratory distress Lungs: Improved air movement throughout with no wheezing Cardiovascular:  tachycardia but regular abdomen: Nontender, soft, bowel sounds positive Extremities: No significant lower extremity edema bilaterally  CBC: Recent Labs  Lab 10/16/19 0500  10/17/19 0540 10/17/19 0550 10/18/19 0634  WBC 10.9*  --  7.9  --  17.2*  NEUTROABS 8.7*  --  6.4  --  14.2*  HGB 10.6*   < > 10.7* 10.2* 12.1  HCT 35.1*   < > 36.1 30.0* 43.1  MCV 99.2  --  100.8*  --  104.9*  PLT 372  --  355  --  469*   < > = values in this interval not displayed.   Basic Metabolic Panel: Recent Labs  Lab 10/16/19 0500  10/17/19 0540 10/17/19 0550 10/18/19 0634 10/19/19 0540  NA 148*   < > 148* 149* 151* 153*  K 3.7   < > 4.5 4.4 4.7 4.3  CL 119*  --  120*  --  119* 120*  CO2 17*  --  18*  --  22 22  GLUCOSE 203*  --  293*  --  125* 145*  BUN 84*  --  87*  --  79* 75*  CREATININE 3.16*  --  2.69*  --  2.37* 2.06*  CALCIUM 7.9*  --  8.2*  --  8.8* 9.1  MG 2.3  --  2.2  --  2.3  --   PHOS 3.9  --  5.1*  --  6.6*  --    < > = values in this interval not displayed.   GFR: Estimated Creatinine Clearance: 38 mL/min (A) (by C-G formula based on SCr of 2.06 mg/dL (H)).  Liver Function Tests: Recent Labs  Lab 10/15/19 0505 10/16/19 0500 10/17/19 0540 10/18/19 0634  AST 20 22 15 19   ALT 14 17 20 24   ALKPHOS 80 78 75 98  BILITOT 0.5 0.5 0.8 0.3  PROT 5.4* 5.3* 5.4* 6.1*  ALBUMIN 2.3* 2.1* 2.0* 2.3*    HbA1C: Hemoglobin A1C  Date/Time Value Ref Range Status  09/19/2019 04:45 PM 14.7 (A) 4.0 - 5.6 % Final  08/19/2018 03:33 PM 8.7 (A) 4.0 - 5.6 % Final   Hgb A1c MFr Bld  Date/Time Value Ref Range Status  04/19/2018 12:28 PM 11.5 (H) 4.6 - 6.5 % Final    Comment:    Glycemic Control Guidelines for People with Diabetes:Non Diabetic:  <6%Goal of Therapy: <7%Additional Action Suggested:  >8%   06/27/2016 09:34 AM 9.1 (H) 4.6 - 6.5 % Final    Comment:    Glycemic Control Guidelines for People with Diabetes:Non Diabetic:  <6%Goal of Therapy: <7%Additional Action Suggested:  >8%      CBG: Recent Labs  Lab 10/18/19 1653 10/18/19 2008 10/18/19 2352 10/19/19 0318 10/19/19 0751  GLUCAP 108* 154* 179* 150* 108*    Recent Results (from the past 240 hour(s))  Blood Culture (routine x 2)     Status: None   Collection Time: 10/13/19  9:04 AM   Specimen: BLOOD  Result Value Ref Range Status   Specimen Description   Final    BLOOD LEFT [facility] Performed at [facility], [address], [place], [place] [address]    Special Requests   Final    BOTTLES DRAWN AEROBIC AND ANAEROBIC Blood Culture adequate volume Performed at [facility],  [place], [place] [address]    Culture   Final    NO GROWTH 5 DAYS Performed at [facility], [place] [address] [place], [place] [address]    Report Status 10/18/2019 FINAL  Final  Blood Culture (routine x 2)     Status: None (Preliminary result)   Collection Time: 10/13/19  9:04 AM   Specimen: BLOOD  Result Value Ref Range Status   Specimen Description BLOOD RIGHT FA  Final   Special Requests   Final    BOTTLES DRAWN AEROBIC AND ANAEROBIC Blood Culture adequate volume   Culture   Final    NO GROWTH 3 DAYS Performed at [facility], [address] [place], [place] [address]    Report Status PENDING  Incomplete  Urine Culture     Status: Abnormal   Collection Time: 10/13/19  9:04 AM   Specimen: Urine, Random  Result Value Ref Range Status   Specimen Description   Final    URINE, RANDOM Performed at [facility], [address], [place], [place] [address]    Special Requests   Final    NONE Performed at [facility], [address], [place], [place] [address]    Culture MULTIPLE SPECIES PRESENT, SUGGEST RECOLLECTION (A)  Final   Report Status 10/14/2019 FINAL  Final  MRSA PCR Screening     Status: None   Collection Time: 10/13/19 11:51 PM   Specimen: Nasal Mucosa; Nasopharyngeal  Result Value Ref Range Status   MRSA by PCR NEGATIVE NEGATIVE Final     Comment:        The GeneXpert MRSA Assay (FDA approved for NASAL specimens only), is one component of a comprehensive MRSA colonization surveillance program. It is not intended to diagnose MRSA infection nor to guide or monitor treatment for MRSA infections. Performed at [facility], [place] [address], [place], [place] [address]      Scheduled Meds: . Chlorhexidine Gluconate Cloth  6 each Topical Q0600  . cloNIDine  0.1 mg Per Tube TID  . dexamethasone  6 mg Per Tube Daily  . escitalopram  20 mg Per Tube Daily  . feeding supplement (VITAL AF 1.2 CAL)  1,000 mL Per Tube Q24H  . free water  400 mL Per Tube Q4H  . heparin injection (subcutaneous)  5,000 Units Subcutaneous Q8H  . insulin aspart  0-20 Units Subcutaneous Q4H  . insulin glargine  30 Units Subcutaneous BID  . lamoTRIgine  150 mg Per Tube Daily  . pantoprazole sodium  40 mg Per Tube Q1200  . polyethylene glycol  17 g Oral Daily  . sodium chloride flush  3 mL Intravenous Q12H  . vitamin C  500 mg Per Tube Daily  . zinc sulfate  220 mg Per Tube Daily     LOS: 6 days   Cherene Altes, MD [facility] Office  [phone] Pager - Text Page per Amion  If 7PM-7AM, please contact night-coverage per Amion 10/19/2019, 10:18 AM

## 2019-10-19 NOTE — Progress Notes (Signed)
ANTICOAGULATION CONSULT NOTE - follow up consult  Pharmacy Consult for Heparin Indication: VTE prophylaxis, COVID-PACT Trial  Allergies  Allergen Reactions  . Amoxicillin Hives and Rash  . Avocado Swelling  . Clarithromycin Hives and Rash  . Honey Anaphylaxis  . Other Hives and Swelling    All melon  . Sulfa Drugs Cross Reactors Anaphylaxis  . Watermelon Concentrate Hives and Swelling    Oral swelling, ears swell shut  . Metoprolol Other (See Comments)    Hypotension  . Adhesive [Tape] Itching    Skin breakdown  . Levemir [Insulin Detemir] Rash    Red/swollen injection site reactions with residual lumps  . Victoza [Liraglutide] Nausea Only    Patient Measurements: Height: 5\' 4"  (162.6 cm) Weight: 220 lb 7.4 oz (100 kg) IBW/kg (Calculated) : 54.7  Vital Signs: Temp: 98 F (36.7 C) (11/08 0800) Temp Source: Oral (11/08 0800) BP: 149/82 (11/08 0800) Pulse Rate: 122 (11/08 0800)  Labs: Recent Labs    10/17/19 0540 10/17/19 0550 10/18/19 0634 10/19/19 0540  HGB 10.7* 10.2* 12.1  --   HCT 36.1 30.0* 43.1  --   PLT 355  --  469*  --   LABPROT 14.2  --  13.7  --   INR 1.1  --  1.1  --   CREATININE 2.69*  --  2.37* 2.06*    Estimated Creatinine Clearance: 38 mL/min (A) (by C-G formula based on SCr of 2.06 mg/dL (H)).  Medications:  Scheduled:  . Chlorhexidine Gluconate Cloth  6 each Topical Q0600  . cloNIDine  0.1 mg Per Tube TID  . dexamethasone  6 mg Per Tube Daily  . escitalopram  20 mg Per Tube Daily  . feeding supplement (VITAL AF 1.2 CAL)  1,000 mL Per Tube Q24H  . free water  400 mL Per Tube Q4H  . heparin injection (subcutaneous)  5,000 Units Subcutaneous Q8H  . insulin aspart  0-20 Units Subcutaneous Q4H  . insulin glargine  30 Units Subcutaneous BID  . lamoTRIgine  150 mg Per Tube Daily  . polyethylene glycol  17 g Oral Daily  . sodium chloride flush  3 mL Intravenous Q12H  . vitamin C  500 mg Per Tube Daily  . zinc sulfate  220 mg Per Tube Daily     Assessment: 48 yo  Female admitted 10/13/19 with DKA and COVID-19 pneumonia, intubated for hypoxemic respiratory failure. Enrolled into COVID-PACT trial today, heparin VTE prophylaxis arm.  Hgb 12.1, Plts 469k, SCr 2.06 with CrCl > 30 ml/min.  Goal of Therapy:  VTE prophlyaxis Monitor platelets by anticoagulation protocol: Yes   Plan:  Heparin 5000 units SQ q8h Monitor CBC, signs/symptoms of bleeding  Gretta Arab PharmD, BCPS Clinical pharmacist phone 7am- 5pm: [phone] 10/19/2019 11:18 AM

## 2019-10-19 NOTE — Progress Notes (Signed)
Pt's husband Jeneen Rinks called and given update on pt's condition and plan of care for today, including potential transfer out of ICU to the progressive unit.

## 2019-10-20 ENCOUNTER — Inpatient Hospital Stay (HOSPITAL_COMMUNITY): Payer: BC Managed Care – PPO

## 2019-10-20 LAB — COMPREHENSIVE METABOLIC PANEL
ALT: 19 U/L (ref 0–44)
AST: 11 U/L — ABNORMAL LOW (ref 15–41)
Albumin: 2.2 g/dL — ABNORMAL LOW (ref 3.5–5.0)
Alkaline Phosphatase: 73 U/L (ref 38–126)
Anion gap: 9 (ref 5–15)
BUN: 58 mg/dL — ABNORMAL HIGH (ref 6–20)
CO2: 26 mmol/L (ref 22–32)
Calcium: 8.9 mg/dL (ref 8.9–10.3)
Chloride: 116 mmol/L — ABNORMAL HIGH (ref 98–111)
Creatinine, Ser: 1.52 mg/dL — ABNORMAL HIGH (ref 0.44–1.00)
GFR calc Af Amer: 46 mL/min — ABNORMAL LOW (ref >60)
GFR calc non Af Amer: 40 mL/min — ABNORMAL LOW (ref >60)
Glucose, Bld: 142 mg/dL — ABNORMAL HIGH (ref 70–99)
Potassium: 3.7 mmol/L (ref 3.5–5.1)
Sodium: 151 mmol/L — ABNORMAL HIGH (ref 135–145)
Total Bilirubin: 0.6 mg/dL (ref 0.3–1.2)
Total Protein: 5.8 g/dL — ABNORMAL LOW (ref 6.5–8.1)

## 2019-10-20 LAB — CBC
HCT: 37.4 % (ref 36.0–46.0)
Hemoglobin: 11.3 g/dL — ABNORMAL LOW (ref 12.0–15.0)
MCH: 30.3 pg (ref 26.0–34.0)
MCHC: 30.2 g/dL (ref 30.0–36.0)
MCV: 100.3 fL — ABNORMAL HIGH (ref 80.0–100.0)
Platelets: 452 10*3/uL — ABNORMAL HIGH (ref 150–400)
RBC: 3.73 MIL/uL — ABNORMAL LOW (ref 3.87–5.11)
RDW: 16.1 % — ABNORMAL HIGH (ref 11.5–15.5)
WBC: 7.9 10*3/uL (ref 4.0–10.5)
nRBC: 0 % (ref 0.0–0.2)

## 2019-10-20 LAB — GLUCOSE, CAPILLARY
Glucose-Capillary: 100 mg/dL — ABNORMAL HIGH (ref 70–99)
Glucose-Capillary: 105 mg/dL — ABNORMAL HIGH (ref 70–99)
Glucose-Capillary: 126 mg/dL — ABNORMAL HIGH (ref 70–99)
Glucose-Capillary: 187 mg/dL — ABNORMAL HIGH (ref 70–99)
Glucose-Capillary: 194 mg/dL — ABNORMAL HIGH (ref 70–99)
Glucose-Capillary: 227 mg/dL — ABNORMAL HIGH (ref 70–99)
Glucose-Capillary: 96 mg/dL (ref 70–99)

## 2019-10-20 LAB — PROTIME-INR
INR: 1 (ref 0.8–1.2)
Prothrombin Time: 13 seconds (ref 11.4–15.2)

## 2019-10-20 LAB — FIBRINOGEN: Fibrinogen: 691 mg/dL — ABNORMAL HIGH (ref 210–475)

## 2019-10-20 LAB — TRIGLYCERIDES: Triglycerides: 206 mg/dL — ABNORMAL HIGH (ref <150)

## 2019-10-20 MED ORDER — HALOPERIDOL LACTATE 5 MG/ML IJ SOLN: 2.0000 mg | Freq: Four times a day (QID) | INTRAMUSCULAR | Status: DC | PRN

## 2019-10-20 MED ORDER — DEXAMETHASONE 1 MG/ML PO CONC
6.0000 mg | Freq: Every day | ORAL | Status: AC
  Administered 2019-10-21 – 2019-10-23 (×3): 6 mg
  Filled 2019-10-20 (×3): qty 6

## 2019-10-20 MED ORDER — FUROSEMIDE 10 MG/ML PO SOLN
40.0000 mg | Freq: Every day | ORAL | Status: DC
  Administered 2019-10-20 – 2019-10-21 (×2): 40 mg
  Filled 2019-10-20 (×4): qty 4

## 2019-10-20 NOTE — Progress Notes (Signed)
  Speech Language Pathology Treatment: Dysphagia  Patient Details Name: Maria Bentley MRN: [id number] DOB: 12-Apr-1971 Today's Date: 10/20/2019 Time: [id number] SLP Time Calculation (min) (ACUTE ONLY): 21 min  Assessment / Plan / Recommendation Clinical Impression  Pt shows mild improvements in vocal quality and mentation since initial evaluation, but she continues to have coughing with PO intake. She coughs consistently with trials of thin liquids, even when provided with hand-over-hand assist to reduce impulsivity with intake. Coughing is not as consistent with thicker liquids and purees but still occurs, especially as trials continue. Would continue to provide nutrition and medication via alternative means (has cortrak), but would allow ice chips after oral care. She could also have a few small bites of puree at a time from the floor stock, but if she does, would wait at least 30 minutes before giving any additional ice chips (would not give them at the same time) and would perform oral care again.    HPI HPI: Pt is a 48 yo female admitted with DKA and COVID-19 requiring ETT 11/2-11/16. PMH includes obesity s/p gastric bypass, HTN, HLD, DM, anxiety, depression, endometriosis      SLP Plan  Continue with current plan of care       Recommendations  Diet recommendations: NPO;Other(comment)(ice chips after oral care OR bites of puree) Medication Administration: Via alternative means                Oral Care Recommendations: Oral care QID Follow up Recommendations: (tba) SLP Visit Diagnosis: Dysphagia, unspecified (R13.10) Plan: Continue with current plan of care       GO                Maria Bentley Maria Bentley 10/20/2019, 4:21 PM  Maria Bentley, M.A. Maria Bentley Acute Environmental education officer [phone] Office [phone]

## 2019-10-20 NOTE — Evaluation (Signed)
Occupational Therapy Evaluation Patient Details Name: Maria Bentley MRN: [id number] DOB: 1971-09-03 Today's Date: 10/20/2019    History of Present Illness 48 y.o.femalewith a history ofpoorly-controlleddiabetes, recurrent rectal abscesses, morbid obesity s/p gastric bypass with vitamin D deficiency, depression and anxiety who presented to the ED at [facility] 11/2 after being found on thebedroomfloor by her husband (who is also sick with Covid). Pt admitted with covid PNA, uncontrolled DM with DKA, and AKI. On admission to [facility] ICU was obtunded and intubated on arrival; extubated 11/6.    Clinical Impression   This 48 y/o female presents with the above. PTA pt reports independence with ADL, iADL and functional mobility. Pt presenting with impaired cognition including impulsivity, weakness, decreased activity tolerance. Pt requiring minA (+2 safety/equipment) for functional transfers this session (via [facility]). She currently requires mod-maxA for LB and toileting ADL. Pt on 4L HFNC with overall O2 sats >90% with activity. BP 150/85 post transfer to [facility], 142/66 end of session once seated upright in bed. She will benefit from continued acute OT services, given pt's current level of function and cognition, and considering pt's spouse also recovering with covid currently recommend follow up post acute rehab services prior to return home to progress pt towards her PLOF. Will continue to follow acutely.     Follow Up Recommendations  Supervision/Assistance - 24 hour;SNF(home with 24hr vs post acute rehab services)    Equipment Recommendations  Other (comment)(to be further assessed, likely has DME needed)           Precautions / Restrictions Precautions Precautions: Fall Precaution Comments: impulsive Restrictions Weight Bearing Restrictions: No      Mobility Bed Mobility Overal bed mobility: Needs Assistance Bed Mobility: Supine to Sit;Sit to Supine     Supine to sit: HOB elevated;Min  guard Sit to supine: Min guard;HOB elevated   General bed mobility comments: close minguard for safety and lines; pt impulsive to move towards EOB despite being asked to wait for therapist assist  Transfers Overall transfer level: Needs assistance Equipment used: 1 person hand held assist;2 person hand held assist Transfers: Sit to/from [facility] Sit to Stand: Min assist;+2 safety/equipment Stand pivot transfers: Min assist;+2 safety/equipment       General transfer comment: steadying assist throughout, VCs for hand placement and safety; pt transfer to [facility], to recliner then back to EOB    Balance Overall balance assessment: Needs assistance Sitting-balance support: Feet supported Sitting balance-Leahy Scale: Fair     Standing balance support: Single extremity supported;No upper extremity supported;During functional activity Standing balance-Leahy Scale: Poor Standing balance comment: external assist for balance                           ADL either performed or assessed with clinical judgement   ADL Overall ADL's : Needs assistance/impaired Eating/Feeding: NPO   Grooming: Set up;Min guard;Sitting   Upper Body Bathing: Min guard;Sitting   Lower Body Bathing: Moderate assistance;Sit to/from stand   Upper Body Dressing : Min guard;Sitting   Lower Body Dressing: Moderate assistance;Sit to/from stand   Toilet Transfer: Minimal assistance;+2 for safety/equipment;BSC;Stand-pivot   Toileting- Clothing Manipulation and Hygiene: Moderate assistance;+2 for physical assistance;+2 for safety/equipment;Sit to/from stand Toileting - Clothing Manipulation Details (indicate cue type and reason): assist for pericare after BM, requires additional assist for steadying in standing     Functional mobility during ADLs: Minimal assistance;+2 for safety/equipment General ADL Comments: pt with impaired cognition, impulsivity, weakness and fatigues with  activity      Vision         Perception     Praxis      Pertinent Vitals/Pain Pain Assessment: No/denies pain     Hand Dominance Right   Extremity/Trunk Assessment Upper Extremity Assessment Upper Extremity Assessment: Generalized weakness(?impaired coorindation RUE, to be further assessed)   Lower Extremity Assessment Lower Extremity Assessment: Defer to PT evaluation       Communication Communication Communication: No difficulties   Cognition Arousal/Alertness: Awake/alert Behavior During Therapy: Impulsive Overall Cognitive Status: No family/caregiver present to determine baseline cognitive functioning Area of Impairment: Safety/judgement                         Safety/Judgement: Decreased awareness of safety;Decreased awareness of deficits     General Comments: pt oriented to hospital but thinks she is in [place], very impulsive despite safety cues, requires consistent/max cueing to ensure command following and for safety as pt quick to move   General Comments       Exercises     Shoulder Instructions      Home Living Family/patient expects to be discharged to:: Private residence Living Arrangements: Spouse/significant other Available Help at Discharge: Family Type of Home: House Home Access: Stairs to enter Technical brewer of Steps: 4 Entrance Stairs-Rails: Left Home Layout: One level     Bathroom Shower/Tub: Tub/shower unit         Home Equipment: Bedside commode;Walker - 2 wheels          Prior Functioning/Environment Level of Independence: Independent        Comments: drives        OT Problem List: Decreased strength;Decreased range of motion;Decreased activity tolerance;Impaired balance (sitting and/or standing);Decreased cognition;Decreased safety awareness;Decreased coordination;Decreased knowledge of use of DME or AE;Cardiopulmonary status limiting activity;Obesity;Decreased knowledge of precautions      OT  Treatment/Interventions: Self-care/ADL training;Therapeutic exercise;Energy conservation;DME and/or AE instruction;Therapeutic activities;Cognitive remediation/compensation;Patient/family education;Balance training    OT Goals(Current goals can be found in the care plan section) Acute Rehab OT Goals Patient Stated Goal: "coffee" OT Goal Formulation: With patient Time For Goal Achievement: 11/03/19 Potential to Achieve Goals: Good  OT Frequency: Min 2X/week(will benefit from 3x)   Barriers to D/C:            Co-evaluation PT/OT/SLP Co-Evaluation/Treatment: Yes Reason for Co-Treatment: For patient/therapist safety   OT goals addressed during session: ADL's and self-care      AM-PAC OT "6 Clicks" Daily Activity     Outcome Measure Help from another person eating meals?: None Help from another person taking care of personal grooming?: A Little Help from another person toileting, which includes using toliet, bedpan, or urinal?: A Lot Help from another person bathing (including washing, rinsing, drying)?: A Lot Help from another person to put on and taking off regular upper body clothing?: A Little Help from another person to put on and taking off regular lower body clothing?: A Lot 6 Click Score: 16   End of Session Equipment Utilized During Treatment: Oxygen Nurse Communication: Mobility status  Activity Tolerance: Patient tolerated treatment well Patient left: in bed;with call bell/phone within reach;with bed alarm set;with nursing/sitter in room  OT Visit Diagnosis: Unsteadiness on feet (R26.81);Other symptoms and signs involving cognitive function;Muscle weakness (generalized) (M62.81)                Time: 1500-1530 OT Time Calculation (min): 30 min Charges:  OT General Charges $OT Visit: 1 Visit OT  Evaluation $OT Eval Moderate Complexity: 1 Mod  Lou Cal, OT [employer] Pager [phone] Office [phone]   Raymondo Band 10/20/2019, 4:10 PM

## 2019-10-20 NOTE — Progress Notes (Signed)
Called husband and updated on transfer to [facility] from ICU.

## 2019-10-20 NOTE — Progress Notes (Signed)
Maria Bentley  [id number] DOB: [birth year] DOA: 10/13/2019 PCP: [name], MD    Brief Narrative:  48 y.o.femalewith a history ofpoorly-controlleddiabetes, recurrent rectal abscesses, morbid obesity s/p gastric bypass with vitamin D deficiency, depression and anxiety who presented to the ED at [facility] 11/2 after being found on thebedroomfloor by her husband. They both have covid-19, and he has been sick, nearly bed-ridden all weekend so is not aware of her last couple days. He had heard her getting up to the kitchen and bathroom regularly, not sure if she was taking medications as usual or not, though she struggles with that at times. This morning he wasn't able to get her off the floor, so called EMS. On arrival to the ED shefelt cool and appeared mottledwith hypothermia, sinus tachycardia and hypoxia requiring 2L O2. UA without signs of infection, but with leukocytosis treated as sepsis with bilateral CXR infiltrates on hypoexpanded film with vancomycin, cefepime, flagyl. pH initially 7.03, gap 27, gave 2L warm saline, and starting insulin gtt. HR 120, no hypotension, RR 25/min, 98% on 2L O2. Remainedconfused, removing mask, and on admission to [facility] ICU was obtunded and intubated on arrival.  Significant Events: 11/2 admit to [facility] via [facility] -intubated upon arrival at [facility] 11/4 cortrak placed  11/6 extubated  COVID-19 specific Treatment: Remdesivir 11/2 > 11/6 Decadron 11/3 >  Subjective: Mental status dramatically improved today.  Is alert and conversant.  Is asking about eating/having a repeat speech eval.  Denies chest pain or shortness of breath.  Appears stable on [place] high flow nasal cannula at present.  Assessment & Plan:  Covid pneumonia -acute hypoxic respiratory failure Successfully extubated 11/6 - continue decadron -has completed a course of remdesivir - no convalescent plasma due to multiple allergies -no Actemra due to questionable septic picture at  presentation -continue to wean oxygen as able  Uncontrolled DM2 with severe DKA A1c 14.7 -history of noncompliance with medications -CBG appears to be trending up somewhat -follow and adjust as needed -continue SSI  Acute kidney injury Appears to be related to hypovolemia with probable component of ATN, as well as urinary retention (900 cc of retained urine) -renal function consistently improving  Recent Labs  Lab 10/16/19 0500 10/17/19 0540 10/18/19 0634 10/19/19 0540 10/20/19 0855  CREATININE 3.16* 2.69* 2.37* 2.06* 1.52*    Acute metabolic encephalopathy Due to DKA and Covid -CT head negative -slowly improving - attempting to avoid sedatives  Elevated troponin Was evaluated by Cardiology -felt to be due to demand ischemia -no ischemic changes appreciated on EKG  Chronic sinus tachycardia Reportedly a longstanding issue - recent TSH 2.84 -appears to have responded favorably to clonidine - continue   Migraine headaches  History of obesity status post gastric bypass - Estimated body mass index is 37.84 kg/m as calculated from the following:   Height as of this encounter: 5\' 4"  (1.626 m).   Weight as of this encounter: 100 kg.  History of depression with anxiety  DVT prophylaxis: per COVID-PACT  Code Status: FULL CODE Family Communication:  Disposition Plan: Transfer to medical bed  Consultants:  PCCM  Antimicrobials:  None  Objective: Blood pressure 138/88, pulse 74, temperature 97.7 F (36.5 C), temperature source Oral, resp. rate 18, height 5\' 4"  (1.626 m), weight 100 kg, last menstrual period 05/09/2016, SpO2 95 %.  Intake/Output Summary (Last 24 hours) at 10/20/2019 1725 Last data filed at 10/20/2019 1200 Gross per 24 hour  Intake 1160 ml  Output 1300 ml  Net -  140 ml   Filed Weights   10/16/19 0500 10/16/19 1930 10/17/19 1200  Weight: 100.6 kg 100.6 kg 100 kg    Examination: General: Much more alert -no respiratory distress Lungs: Faint distant  crackles with no wheezing Cardiovascular: RRR without murmur abdomen: Nontender, soft, bowel sounds positive Extremities: No significant edema B LE   CBC: Recent Labs  Lab 10/16/19 0500  10/17/19 0540 10/17/19 0550 10/18/19 0634 10/20/19 0855  WBC 10.9*  --  7.9  --  17.2* 7.9  NEUTROABS 8.7*  --  6.4  --  14.2*  --   HGB 10.6*   < > 10.7* 10.2* 12.1 11.3*  HCT 35.1*   < > 36.1 30.0* 43.1 37.4  MCV 99.2  --  100.8*  --  104.9* 100.3*  PLT 372  --  355  --  469* 452*   < > = values in this interval not displayed.   Basic Metabolic Panel: Recent Labs  Lab 10/16/19 0500  10/17/19 0540  10/18/19 0634 10/19/19 0540 10/20/19 0855  NA 148*   < > 148*   < > 151* 153* 151*  K 3.7   < > 4.5   < > 4.7 4.3 3.7  CL 119*  --  120*  --  119* 120* 116*  CO2 17*  --  18*  --  22 22 26   GLUCOSE 203*  --  293*  --  125* 145* 142*  BUN 84*  --  87*  --  79* 75* 58*  CREATININE 3.16*  --  2.69*  --  2.37* 2.06* 1.52*  CALCIUM 7.9*  --  8.2*  --  8.8* 9.1 8.9  MG 2.3  --  2.2  --  2.3  --   --   PHOS 3.9  --  5.1*  --  6.6*  --   --    < > = values in this interval not displayed.   GFR: Estimated Creatinine Clearance: 51.5 mL/min (A) (by C-G formula based on SCr of 1.52 mg/dL (H)).  Liver Function Tests: Recent Labs  Lab 10/16/19 0500 10/17/19 0540 10/18/19 0634 10/20/19 0855  AST 22 15 19  11*  ALT 17 20 24 19   ALKPHOS 78 75 98 73  BILITOT 0.5 0.8 0.3 0.6  PROT 5.3* 5.4* 6.1* 5.8*  ALBUMIN 2.1* 2.0* 2.3* 2.2*    HbA1C: Hemoglobin A1C  Date/Time Value Ref Range Status  09/19/2019 04:45 PM 14.7 (A) 4.0 - 5.6 % Final  08/19/2018 03:33 PM 8.7 (A) 4.0 - 5.6 % Final   Hgb A1c MFr Bld  Date/Time Value Ref Range Status  04/19/2018 12:28 PM 11.5 (H) 4.6 - 6.5 % Final    Comment:    Glycemic Control Guidelines for People with Diabetes:Non Diabetic:  <6%Goal of Therapy: <7%Additional Action Suggested:  >8%   06/27/2016 09:34 AM 9.1 (H) 4.6 - 6.5 % Final    Comment:    Glycemic  Control Guidelines for People with Diabetes:Non Diabetic:  <6%Goal of Therapy: <7%Additional Action Suggested:  >8%     CBG: Recent Labs  Lab 10/20/19 0007 10/20/19 0347 10/20/19 0833 10/20/19 1042 10/20/19 1635  GLUCAP 187* 100* 96 126* 227*    Recent Results (from the past 240 hour(s))  Blood Culture (routine x 2)     Status: None   Collection Time: 10/13/19  9:04 AM   Specimen: BLOOD  Result Value Ref Range Status   Specimen Description   Final    BLOOD LEFT AC  Performed at [facility], [address] [place], [place] [address]    Special Requests   Final    BOTTLES DRAWN AEROBIC AND ANAEROBIC Blood Culture adequate volume Performed at [facility], [address], [place], [place] [address]    Culture   Final    NO GROWTH 5 DAYS Performed at [facility], [place] [address], [place], [place] [address]    Report Status 10/18/2019 FINAL  Final  Blood Culture (routine x 2)     Status: None (Preliminary result)   Collection Time: 10/13/19  9:04 AM   Specimen: BLOOD  Result Value Ref Range Status   Specimen Description BLOOD RIGHT FA  Final   Special Requests   Final    BOTTLES DRAWN AEROBIC AND ANAEROBIC Blood Culture adequate volume   Culture   Final    NO GROWTH 3 DAYS Performed at [facility], [address] [place], [place] [address]    Report Status PENDING  Incomplete  Urine Culture     Status: Abnormal   Collection Time: 10/13/19  9:04 AM   Specimen: Urine, Random  Result Value Ref Range Status   Specimen Description   Final    URINE, RANDOM Performed at [facility], [address] [place], [place] [address]    Special Requests   Final    NONE Performed at [facility], [address] [place], [place] [address]    Culture MULTIPLE SPECIES PRESENT, SUGGEST RECOLLECTION (A)  Final   Report Status 10/14/2019 FINAL  Final  MRSA PCR Screening     Status: None   Collection Time: 10/13/19 11:51 PM    Specimen: Nasal Mucosa; Nasopharyngeal  Result Value Ref Range Status   MRSA by PCR NEGATIVE NEGATIVE Final    Comment:        The GeneXpert MRSA Assay (FDA approved for NASAL specimens only), is one component of a comprehensive MRSA colonization surveillance program. It is not intended to diagnose MRSA infection nor to guide or monitor treatment for MRSA infections. Performed at [facility], [place] [address] [place], [place] [address]      Scheduled Meds: . Chlorhexidine Gluconate Cloth  6 each Topical Q0600  . cloNIDine  0.1 mg Per Tube TID  . dexamethasone  6 mg Per Tube Daily  . escitalopram  20 mg Per Tube Daily  . feeding supplement (VITAL AF 1.2 CAL)  1,000 mL Per Tube Q24H  . free water  400 mL Per Tube Q4H  . heparin injection (subcutaneous)  5,000 Units Subcutaneous Q8H  . insulin aspart  0-20 Units Subcutaneous Q4H  . insulin glargine  30 Units Subcutaneous BID  . lamoTRIgine  150 mg Per Tube Daily  . polyethylene glycol  17 g Oral BID  . sennosides  5 mL Per Tube BID  . sodium chloride flush  3 mL Intravenous Q12H  . vitamin C  500 mg Per Tube Daily  . zinc sulfate  220 mg Per Tube Daily     LOS: 7 days   Cherene Altes, MD [facility] Office  [phone] Pager - Text Page per Amion  If 7PM-7AM, please contact night-coverage per Amion 10/20/2019, 5:25 PM

## 2019-10-20 NOTE — Evaluation (Signed)
Physical Therapy Evaluation Patient Details Name: Maria Bentley MRN: [id number] DOB: 12-01-1971 Today's Date: 10/20/2019   History of Present Illness  48 YO female with H/O DM, recurrent rectal abcesses, s/P gastric bypass with vitamin D deficiency, depression and anxiety presented to [facility] ED 11/2 after being found down by spouse(who has Covid). Patient admitted with covid, DKA, AKI, VDRF. Extubated 11/6.  Clinical Impression  The patient is impulsive, frequent cues for safety. patient does mobilize with assistance with noted decreased balance and decreased awareness. Patient hopefully will progress to being safet with mobility and ambulation to return home. Pt admitted with above diagnosis.  Pt currently with functional limitations due to the deficits listed below (see PT Problem List). Pt will benefit from skilled PT to increase their independence and safety with mobility to allow discharge to the venue listed below.       Follow Up Recommendations Home health PT;SNF(depends on progress)    Equipment Recommendations  None recommended by PT    Recommendations for Other Services       Precautions / Restrictions Precautions Precautions: Fall Precaution Comments: impulsive, coretrac Restrictions Weight Bearing Restrictions: No      Mobility  Bed Mobility Overal bed mobility: Needs Assistance Bed Mobility: Supine to Sit;Sit to Supine     Supine to sit: HOB elevated;Min guard Sit to supine: Min guard;HOB elevated   General bed mobility comments: close minguard for safety and lines; pt impulsive to move towards EOB despite being asked to wait for therapist assist  Transfers Overall transfer level: Needs assistance Equipment used: 1 person hand held assist;2 person hand held assist Transfers: Sit to/from [facility] Sit to Stand: Min assist;+2 safety/equipment Stand pivot transfers: Min assist;+2 safety/equipment       General transfer comment:  steadying assist throughout, VCs for hand placement and safety; pt transfer to [facility], to recliner then back to EOB  Ambulation/Gait                Stairs            Wheelchair Mobility    Modified Rankin (Stroke Patients Only)       Balance Overall balance assessment: Needs assistance Sitting-balance support: Feet supported Sitting balance-Leahy Scale: Fair     Standing balance support: Single extremity supported;No upper extremity supported;During functional activity Standing balance-Leahy Scale: Poor Standing balance comment: external assist for balance                             Pertinent Vitals/Pain Pain Assessment: Faces Faces Pain Scale: No hurt    Home Living Family/patient expects to be discharged to:: Private residence Living Arrangements: Spouse/significant other Available Help at Discharge: Family Type of Home: House Home Access: Stairs to enter Entrance Stairs-Rails: Left Entrance Stairs-Number of Steps: 4 Home Layout: One level Home Equipment: Bedside commode;Walker - 2 wheels      Prior Function Level of Independence: Independent         Comments: drives     Hand Dominance   Dominant Hand: Right    Extremity/Trunk Assessment   Upper Extremity Assessment Upper Extremity Assessment: Generalized weakness    Lower Extremity Assessment Lower Extremity Assessment: Generalized weakness    Cervical / Trunk Assessment Cervical / Trunk Assessment: Normal  Communication   Communication: No difficulties  Cognition Arousal/Alertness: Awake/alert Behavior During Therapy: Impulsive Overall Cognitive Status: No family/caregiver present to determine baseline cognitive functioning Area of Impairment: Safety/judgement  Safety/Judgement: Decreased awareness of safety;Decreased awareness of deficits     General Comments: pt oriented to hospital but thinks she is in [place], very impulsive  despite safety cues, requires consistent/max cueing to ensure command following and for safety as pt quick to move      General Comments      Exercises     Assessment/Plan    PT Assessment Patient needs continued PT services  PT Problem List Decreased strength;Decreased activity tolerance;Decreased balance;Decreased mobility;Decreased safety awareness;Decreased knowledge of precautions;Decreased cognition;Decreased knowledge of use of DME;Cardiopulmonary status limiting activity       PT Treatment Interventions DME instruction;Therapeutic activities;Cognitive remediation;Gait training;Therapeutic exercise;Patient/family education;Functional mobility training;Stair training    PT Goals (Current goals can be found in the Care Plan section)  Acute Rehab PT Goals Patient Stated Goal: "coffee" PT Goal Formulation: Patient unable to participate in goal setting Time For Goal Achievement: 11/03/19 Potential to Achieve Goals: Good    Frequency Min 3X/week   Barriers to discharge Decreased caregiver support spouse is ill, may be recovered to care for patient    Co-evaluation PT/OT/SLP Co-Evaluation/Treatment: Yes Reason for Co-Treatment: For patient/therapist safety PT goals addressed during session: Mobility/safety with mobility OT goals addressed during session: ADL's and self-care       AM-PAC PT "6 Clicks" Mobility  Outcome Measure Help needed turning from your back to your side while in a flat bed without using bedrails?: A Little Help needed moving from lying on your back to sitting on the side of a flat bed without using bedrails?: A Lot Help needed moving to and from a bed to a chair (including a wheelchair)?: A Lot Help needed standing up from a chair using your arms (e.g., wheelchair or bedside chair)?: A Lot Help needed to walk in hospital room?: Total Help needed climbing 3-5 steps with a railing? : Total 6 Click Score: 11    End of Session Equipment Utilized  During Treatment: Oxygen Activity Tolerance: Patient limited by fatigue Patient left: in bed;with call bell/phone within reach;with nursing/sitter in room;with bed alarm set Nurse Communication: Mobility status PT Visit Diagnosis: Unsteadiness on feet (R26.81)    Time: 1500-1530 PT Time Calculation (min) (ACUTE ONLY): 30 min   Charges:   PT Evaluation $PT Eval Moderate Complexity: [place]  Office [phone]   Claretha Cooper 10/20/2019, 5:10 PM

## 2019-10-21 LAB — CULTURE, BLOOD (ROUTINE X 2)
Culture: NO GROWTH
Special Requests: ADEQUATE

## 2019-10-21 LAB — COMPREHENSIVE METABOLIC PANEL
ALT: 20 U/L (ref 0–44)
AST: 14 U/L — ABNORMAL LOW (ref 15–41)
Albumin: 2.4 g/dL — ABNORMAL LOW (ref 3.5–5.0)
Alkaline Phosphatase: 73 U/L (ref 38–126)
Anion gap: 10 (ref 5–15)
BUN: 44 mg/dL — ABNORMAL HIGH (ref 6–20)
CO2: 29 mmol/L (ref 22–32)
Calcium: 8.7 mg/dL — ABNORMAL LOW (ref 8.9–10.3)
Chloride: 110 mmol/L (ref 98–111)
Creatinine, Ser: 1.39 mg/dL — ABNORMAL HIGH (ref 0.44–1.00)
GFR calc Af Amer: 51 mL/min — ABNORMAL LOW (ref >60)
GFR calc non Af Amer: 44 mL/min — ABNORMAL LOW (ref >60)
Glucose, Bld: 111 mg/dL — ABNORMAL HIGH (ref 70–99)
Potassium: 3.1 mmol/L — ABNORMAL LOW (ref 3.5–5.1)
Sodium: 149 mmol/L — ABNORMAL HIGH (ref 135–145)
Total Bilirubin: 0.3 mg/dL (ref 0.3–1.2)
Total Protein: 6.2 g/dL — ABNORMAL LOW (ref 6.5–8.1)

## 2019-10-21 LAB — GLUCOSE, CAPILLARY
Glucose-Capillary: 51 mg/dL — ABNORMAL LOW (ref 70–99)
Glucose-Capillary: 79 mg/dL (ref 70–99)
Glucose-Capillary: 66 mg/dL — ABNORMAL LOW (ref 70–99)
Glucose-Capillary: 84 mg/dL (ref 70–99)
Glucose-Capillary: 131 mg/dL — ABNORMAL HIGH (ref 70–99)
Glucose-Capillary: 117 mg/dL — ABNORMAL HIGH (ref 70–99)
Glucose-Capillary: 377 mg/dL — ABNORMAL HIGH (ref 70–99)
Glucose-Capillary: 419 mg/dL — ABNORMAL HIGH (ref 70–99)

## 2019-10-21 LAB — PROTIME-INR
INR: 0.9 (ref 0.8–1.2)
Prothrombin Time: 12.2 seconds (ref 11.4–15.2)

## 2019-10-21 LAB — CBC
HCT: 40.2 % (ref 36.0–46.0)
Hemoglobin: 12 g/dL (ref 12.0–15.0)
MCH: 29.7 pg (ref 26.0–34.0)
MCHC: 29.9 g/dL — ABNORMAL LOW (ref 30.0–36.0)
MCV: 99.5 fL (ref 80.0–100.0)
Platelets: 496 10*3/uL — ABNORMAL HIGH (ref 150–400)
RBC: 4.04 MIL/uL (ref 3.87–5.11)
RDW: 16.1 % — ABNORMAL HIGH (ref 11.5–15.5)
WBC: 6.5 10*3/uL (ref 4.0–10.5)
nRBC: 0 % (ref 0.0–0.2)

## 2019-10-21 LAB — FIBRINOGEN: Fibrinogen: 715 mg/dL — ABNORMAL HIGH (ref 210–475)

## 2019-10-21 MED ORDER — VITAL AF 1.2 CAL PO LIQD
1000.0000 mL | ORAL | Status: DC
  Administered 2019-10-21: 1000 mL

## 2019-10-21 MED ORDER — POTASSIUM CHLORIDE 10 MEQ/100ML IV SOLN
10.0000 meq | INTRAVENOUS | Status: DC
  Filled 2019-10-21: qty 100

## 2019-10-21 MED ORDER — GLUCOSE 40 % PO GEL
ORAL | Status: AC
  Administered 2019-10-21: 37.5 g
  Filled 2019-10-21: qty 1

## 2019-10-21 MED ORDER — POTASSIUM CHLORIDE 20 MEQ/15ML (10%) PO SOLN
40.0000 meq | Freq: Once | ORAL | Status: AC
  Administered 2019-10-21: 40 meq
  Filled 2019-10-21: qty 30

## 2019-10-21 MED ORDER — PRO-STAT SUGAR FREE PO LIQD
30.0000 mL | Freq: Three times a day (TID) | ORAL | Status: DC
  Administered 2019-10-21 – 2019-10-25 (×6): 30 mL
  Filled 2019-10-21 (×7): qty 30

## 2019-10-21 MED ORDER — FREE WATER
200.0000 mL | Status: DC
  Administered 2019-10-21 – 2019-10-22 (×5): 200 mL

## 2019-10-21 MED ORDER — INSULIN GLARGINE 100 UNIT/ML ~~LOC~~ SOLN
20.0000 [IU] | Freq: Two times a day (BID) | SUBCUTANEOUS | Status: DC
  Administered 2019-10-21 – 2019-10-22 (×3): 20 [IU] via SUBCUTANEOUS
  Filled 2019-10-21 (×5): qty 0.2

## 2019-10-21 MED ORDER — RESOURCE THICKENUP CLEAR PO POWD
ORAL | Status: DC | PRN
  Filled 2019-10-21: qty 125

## 2019-10-21 NOTE — Progress Notes (Signed)
Initial Nutrition Assessment RD working remotely.  DOCUMENTATION CODES:   Obesity unspecified  INTERVENTION:    Keep Cortrak in place until good po intake is established.   Change to nocturnal tube feeding regimen: Vital AF 1.2 at 70 ml/h x 12 hours per day (840 ml per day). Run from 6pm to 6am.   Continue Pro-stat 30 ml TID   Provides 1308 kcal (~75% of estimated needs), 108 gm protein (98% of estimated needs), 779 ml free water daily   Diet advancement per SLP as able.  NUTRITION DIAGNOSIS:   Increased nutrient needs related to acute illness(COVID-19) as evidenced by estimated needs.  Ongoing   GOAL:   Patient will meet greater than or equal to 90% of their needs  Met  MONITOR:   PO intake, Labs, TF tolerance, Skin  ASSESSMENT:   48 yo female admitted with DKA, COVID-19 PNA. PMH includes DM, HTN, HLD, morbid obesity.   Patient extubated 11/6. SLP following for dysphagia.  Diet advanced to dysphagia 3 with honey thick liquids. Suspect PO intake may be poor, due to dysphagia with restrictive diet.  Cortrak in place, receiving Vital AF 1.2 at 40 with Pro-stat 30 ml TID.    Labs reviewed. Sodium 149 (H), potassium 3.1 (L) CBG's: [phone]  Medications reviewed and include decadron, vitamin C, zinc, novolog, lantus.  IVF: NS at 10 ml/h Receiving free water flushes via Cortrak: 400 ml every 4 hours.  I/O +10.6 L since admission, weight up 3 kg.  Diet Order:   Diet Order            DIET DYS 3 Room service appropriate? Yes; Fluid consistency: Honey Thick  Diet effective now              EDUCATION NEEDS:   Not appropriate for education at this time  Skin:  Skin Assessment: Reviewed RN Assessment  Last BM:  11/10 type 5  Height:   Ht Readings from Last 1 Encounters:  10/17/19 5' 4"  (1.626 m)    Weight:   Wt Readings from Last 1 Encounters:  10/17/19 100 kg    Ideal Body Weight:  54.5 kg  BMI:  37.8  Estimated Nutritional Needs:    Kcal:  1730-2100  Protein:  >/= 110 gm  Fluid:  >/= 1.8 L    Molli Barrows, RD, LDN, [place] Pager [phone] After Hours Pager [phone]

## 2019-10-21 NOTE — Plan of Care (Signed)
Spoke with Husband to update.  Informed of current POC and patient status - enjoyed hearing progress patient is making.  Also assisted with facetime visit.  No further questions at this time.

## 2019-10-21 NOTE — Progress Notes (Signed)
Maria Bentley  [id number] DOB: 1971-05-03 DOA: 10/13/2019 PCP: [name], MD    Brief Narrative:  48 y.o.femalewith a history ofpoorly-controlleddiabetes, recurrent rectal abscesses, morbid obesity s/p gastric bypass with vitamin D deficiency, depression and anxiety who presented to the ED at [facility] 11/2 after being found on thebedroomfloor by her husband. They both have covid-19, and he has been sick, nearly bed-ridden all weekend so is not aware of her last couple days. He had heard her getting up to the kitchen and bathroom regularly, not sure if she was taking medications as usual or not, though she struggles with that at times. This morning he wasn't able to get her off the floor, so called EMS. On arrival to the ED shefelt cool and appeared mottledwith hypothermia, sinus tachycardia and hypoxia requiring 2L O2. UA without signs of infection, but with leukocytosis treated as sepsis with bilateral CXR infiltrates on hypoexpanded film with vancomycin, cefepime, flagyl. pH initially 7.03, gap 27, gave 2L warm saline, and starting insulin gtt. HR 120, no hypotension, RR 25/min, 98% on 2L O2. Remainedconfused, removing mask, and on admission to [facility] ICU was obtunded and intubated on arrival.  Significant Events: 11/2 admit to [facility] via [facility] -intubated upon arrival at [facility] 11/4 cortrak placed  11/6 extubated  COVID-19 specific Treatment: Remdesivir 11/2 > 11/6 Decadron 11/3 >  Subjective: Surprisingly did poorly with SLP eval yesterday therefore she remains n.p.o.  Confusion/agitation and impulsiveness continue to be a challenge.  Has also developed significant hypoglycemia this morning.  At the time of my visit today however she is much improved compared to yesterday.  I think she continues to make progress.  She is still requiring high flow nasal cannula oxygen support.  Her mental status is definitely not yet at her baseline.  Assessment & Plan:  Covid pneumonia  -acute hypoxic respiratory failure Successfully extubated 11/6 - continue decadron -has completed a course of remdesivir - no convalescent plasma due to multiple allergies -no Actemra due to questionable septic picture at presentation -continue to wean oxygen as able  Uncontrolled DM2 with severe DKA and hypoglycemia A1c 14.7 -history of noncompliance with medications -encountered some hypoglycemia this morning therefore Lantus dose decreased -continue to follow CBG closely  Acute kidney injury Felt to be due to hypovolemia with probable component of ATN, as well as urinary retention (900 cc of retained urine) - renal function consistently improving  Recent Labs  Lab 10/17/19 0540 10/18/19 0634 10/19/19 0540 10/20/19 0855 10/21/19 0550  CREATININE 2.69* 2.37* 2.06* 1.52* 1.39*    Acute metabolic encephalopathy Due to DKA and Covid -CT head negative -slowly improving - attempt to avoid sedatives  Hypokalemia Replace via cortrak and follow  Elevated troponin Was evaluated by Cardiology -felt to be due to demand ischemia -no ischemic changes appreciated on EKG  Chronic sinus tachycardia Reportedly a longstanding issue - recent TSH 2.84 -appears to have responded favorably to clonidine -discontinue telemetry  Migraine headaches  History of obesity status post gastric bypass - Estimated body mass index is 37.84 kg/m as calculated from the following:   Height as of this encounter: 5\' 4"  (1.626 m).   Weight as of this encounter: 100 kg.  History of depression with anxiety  DVT prophylaxis: per COVID-PACT  Code Status: FULL CODE Family Communication:  Disposition Plan: MedSurg bed -PT/OT -incentive spirometer -monitor mental status -follow electrolytes -follow CBG  Consultants:  PCCM  Antimicrobials:  None  Objective: Blood pressure (!) 158/83, pulse 79, temperature  98.8 F (37.1 C), temperature source Oral, resp. rate 16, height 5\' 4"  (1.626 m), weight 100 kg, last  menstrual period 05/09/2016, SpO2 91 %.  Intake/Output Summary (Last 24 hours) at 10/21/2019 0849 Last data filed at 10/20/2019 1200 Gross per 24 hour  Intake 640 ml  Output 500 ml  Net 140 ml   Filed Weights   10/16/19 0500 10/16/19 1930 10/17/19 1200  Weight: 100.6 kg 100.6 kg 100 kg    Examination: General: Mildly confused but slowly improving with no respiratory distress Lungs: Fine diffuse crackles with good air movement and no wheezing Cardiovascular: Regular rate and rhythm without murmur abdomen: Nontender, soft, bowel sounds positive Extremities: No edema bilateral lower extremities  CBC: Recent Labs  Lab 10/16/19 0500  10/17/19 0540  10/18/19 0634 10/20/19 0855 10/21/19 0550  WBC 10.9*  --  7.9  --  17.2* 7.9 6.5  NEUTROABS 8.7*  --  6.4  --  14.2*  --   --   HGB 10.6*   < > 10.7*   < > 12.1 11.3* 12.0  HCT 35.1*   < > 36.1   < > 43.1 37.4 40.2  MCV 99.2  --  100.8*  --  104.9* 100.3* 99.5  PLT 372  --  355  --  469* 452* 496*   < > = values in this interval not displayed.   Basic Metabolic Panel: Recent Labs  Lab 10/16/19 0500  10/17/19 0540  10/18/19 0634 10/19/19 0540 10/20/19 0855 10/21/19 0550  NA 148*   < > 148*   < > 151* 153* 151* 149*  K 3.7   < > 4.5   < > 4.7 4.3 3.7 3.1*  CL 119*  --  120*  --  119* 120* 116* 110  CO2 17*  --  18*  --  22 22 26 29   GLUCOSE 203*  --  293*  --  125* 145* 142* 111*  BUN 84*  --  87*  --  79* 75* 58* 44*  CREATININE 3.16*  --  2.69*  --  2.37* 2.06* 1.52* 1.39*  CALCIUM 7.9*  --  8.2*  --  8.8* 9.1 8.9 8.7*  MG 2.3  --  2.2  --  2.3  --   --   --   PHOS 3.9  --  5.1*  --  6.6*  --   --   --    < > = values in this interval not displayed.   GFR: Estimated Creatinine Clearance: 56.3 mL/min (A) (by C-G formula based on SCr of 1.39 mg/dL (H)).  Liver Function Tests: Recent Labs  Lab 10/17/19 0540 10/18/19 0634 10/20/19 0855 10/21/19 0550  AST 15 19 11* 14*  ALT 20 24 19 20   ALKPHOS 75 98 73 73    BILITOT 0.8 0.3 0.6 0.3  PROT 5.4* 6.1* 5.8* 6.2*  ALBUMIN 2.0* 2.3* 2.2* 2.4*    HbA1C: Hemoglobin A1C  Date/Time Value Ref Range Status  09/19/2019 04:45 PM 14.7 (A) 4.0 - 5.6 % Final  08/19/2018 03:33 PM 8.7 (A) 4.0 - 5.6 % Final   Hgb A1c MFr Bld  Date/Time Value Ref Range Status  04/19/2018 12:28 PM 11.5 (H) 4.6 - 6.5 % Final    Comment:    Glycemic Control Guidelines for People with Diabetes:Non Diabetic:  <6%Goal of Therapy: <7%Additional Action Suggested:  >8%   06/27/2016 09:34 AM 9.1 (H) 4.6 - 6.5 % Final    Comment:    Glycemic  Control Guidelines for People with Diabetes:Non Diabetic:  <6%Goal of Therapy: <7%Additional Action Suggested:  >8%     CBG: Recent Labs  Lab 10/20/19 1635 10/20/19 1946 10/20/19 2330 10/21/19 0446 10/21/19 0534  GLUCAP 227* 194* 105* 51* 79    Recent Results (from the past 240 hour(s))  Blood Culture (routine x 2)     Status: None   Collection Time: 10/13/19  9:04 AM   Specimen: BLOOD  Result Value Ref Range Status   Specimen Description   Final    BLOOD LEFT [facility] Performed at [facility], [address] [place], [place] [address]    Special Requests   Final    BOTTLES DRAWN AEROBIC AND ANAEROBIC Blood Culture adequate volume Performed at [facility], [address], [place], [place] [address]    Culture   Final    NO GROWTH 5 DAYS Performed at [facility], [place] [address] [place], [place] [address]    Report Status 10/18/2019 FINAL  Final  Blood Culture (routine x 2)     Status: None (Preliminary result)   Collection Time: 10/13/19  9:04 AM   Specimen: BLOOD  Result Value Ref Range Status   Specimen Description BLOOD RIGHT FA  Final   Special Requests   Final    BOTTLES DRAWN AEROBIC AND ANAEROBIC Blood Culture adequate volume   Culture   Final    NO GROWTH 3 DAYS Performed at [facility], [address], [place], [place] [address]    Report Status PENDING  Incomplete  Urine  Culture     Status: Abnormal   Collection Time: 10/13/19  9:04 AM   Specimen: Urine, Random  Result Value Ref Range Status   Specimen Description   Final    URINE, RANDOM Performed at [facility], [address], [place], [place] [address]    Special Requests   Final    NONE Performed at [facility], [address], [place], [place] [address]    Culture MULTIPLE SPECIES PRESENT, SUGGEST RECOLLECTION (A)  Final   Report Status 10/14/2019 FINAL  Final  MRSA PCR Screening     Status: None   Collection Time: 10/13/19 11:51 PM   Specimen: Nasal Mucosa; Nasopharyngeal  Result Value Ref Range Status   MRSA by PCR NEGATIVE NEGATIVE Final    Comment:        The GeneXpert MRSA Assay (FDA approved for NASAL specimens only), is one component of a comprehensive MRSA colonization surveillance program. It is not intended to diagnose MRSA infection nor to guide or monitor treatment for MRSA infections. Performed at [facility], [place] [address], [place], [place] [address]      Scheduled Meds:  Chlorhexidine Gluconate Cloth  6 each Topical Q0600   dexamethasone  6 mg Per Tube Daily   escitalopram  20 mg Per Tube Daily   feeding supplement (VITAL AF 1.2 CAL)  1,000 mL Per Tube Q24H   free water  400 mL Per Tube Q4H   furosemide  40 mg Per Tube Daily   heparin injection (subcutaneous)  5,000 Units Subcutaneous Q8H   insulin aspart  0-20 Units Subcutaneous Q4H   insulin glargine  20 Units Subcutaneous BID   lamoTRIgine  150 mg Per Tube Daily   polyethylene glycol  17 g Oral BID   sennosides  5 mL Per Tube BID   sodium chloride flush  3 mL Intravenous Q12H   vitamin C  500 mg Per Tube Daily   zinc sulfate  220 mg Per Tube Daily     LOS: 8 days   Cherene Altes, MD [facility] Office  [phone] Pager - Text Page per Amion  If 7PM-7AM, please contact night-coverage per Amion 10/21/2019, 8:49 AM

## 2019-10-21 NOTE — Progress Notes (Signed)
  Speech Language Pathology Treatment: Dysphagia  Patient Details Name: Maria Bentley MRN: [id number] DOB: 1971-07-28 Today's Date: 10/21/2019 Time: [id number] SLP Time Calculation (min) (ACUTE ONLY): 15 min  Assessment / Plan / Recommendation Clinical Impression  Pt demonstrates improving function. Upon arrival she was laying flat in bed with ice, no initiative to reposition herself. However when given verbal cues and assist to use the bed controls pt was able to sit herself up to the top of bed independently. We progressed from teaspoons and cups sips of honey to puree; pt consumed 100% of each with no coughing. When small sips of water was attempted coughing was consistently observed. Subjectively pt is improving, but no 100% recovered after prolonged intubation. Will initiate a dys 3 diet with honey thick liquids. If this is tolerated today, could consider removing tube tomorrow. Will follow up for possible upgrades. Pt does have decreased safety awareness, but cognition is improving. Benefits from explicit single step instructions, we will see if she starts to carry over safety precautions with teaching.    HPI HPI: Pt is a 48 yo female admitted with DKA and COVID-19 requiring ETT 11/2-11/16. PMH includes obesity s/p gastric bypass, HTN, HLD, DM, anxiety, depression, endometriosis      SLP Plan  Continue with current plan of care       Recommendations  Diet recommendations: Dysphagia 3 (mechanical soft);Honey-thick liquid Liquids provided via: Cup Medication Administration: Whole meds with puree Supervision: Patient able to self feed Compensations: Slow rate;Small sips/bites Postural Changes and/or Swallow Maneuvers: Seated upright 90 degrees                Follow up Recommendations: None SLP Visit Diagnosis: Dysphagia, unspecified (R13.10) Plan: Continue with current plan of care       GO               Herbie [place], [place] Pager [phone] Office [phone]  Lynann Beaver 10/21/2019, 10:49 AM

## 2019-10-22 ENCOUNTER — Other Ambulatory Visit: Payer: BC Managed Care – PPO

## 2019-10-22 LAB — PROTIME-INR
INR: 0.9 (ref 0.8–1.2)
Prothrombin Time: 12.4 seconds (ref 11.4–15.2)

## 2019-10-22 LAB — CBC
HCT: 37.2 % (ref 36.0–46.0)
Hemoglobin: 11.2 g/dL — ABNORMAL LOW (ref 12.0–15.0)
MCH: 29.9 pg (ref 26.0–34.0)
MCHC: 30.1 g/dL (ref 30.0–36.0)
MCV: 99.5 fL (ref 80.0–100.0)
Platelets: 477 10*3/uL — ABNORMAL HIGH (ref 150–400)
RBC: 3.74 MIL/uL — ABNORMAL LOW (ref 3.87–5.11)
RDW: 15.7 % — ABNORMAL HIGH (ref 11.5–15.5)
WBC: 7 10*3/uL (ref 4.0–10.5)
nRBC: 0 % (ref 0.0–0.2)

## 2019-10-22 LAB — RENAL FUNCTION PANEL
Albumin: 2.5 g/dL — ABNORMAL LOW (ref 3.5–5.0)
Anion gap: 11 (ref 5–15)
BUN: 37 mg/dL — ABNORMAL HIGH (ref 6–20)
CO2: 27 mmol/L (ref 22–32)
Calcium: 8.4 mg/dL — ABNORMAL LOW (ref 8.9–10.3)
Chloride: 108 mmol/L (ref 98–111)
Creatinine, Ser: 1.16 mg/dL — ABNORMAL HIGH (ref 0.44–1.00)
GFR calc Af Amer: >60 mL/min (ref >60)
GFR calc non Af Amer: 55 mL/min — ABNORMAL LOW (ref >60)
Glucose, Bld: 59 mg/dL — ABNORMAL LOW (ref 70–99)
Phosphorus: 3.9 mg/dL (ref 2.5–4.6)
Potassium: 3.3 mmol/L — ABNORMAL LOW (ref 3.5–5.1)
Sodium: 146 mmol/L — ABNORMAL HIGH (ref 135–145)

## 2019-10-22 LAB — FIBRINOGEN: Fibrinogen: 651 mg/dL — ABNORMAL HIGH (ref 210–475)

## 2019-10-22 LAB — GLUCOSE, CAPILLARY
Glucose-Capillary: 242 mg/dL — ABNORMAL HIGH (ref 70–99)
Glucose-Capillary: 72 mg/dL (ref 70–99)
Glucose-Capillary: 102 mg/dL — ABNORMAL HIGH (ref 70–99)
Glucose-Capillary: 382 mg/dL — ABNORMAL HIGH (ref 70–99)
Glucose-Capillary: 276 mg/dL — ABNORMAL HIGH (ref 70–99)

## 2019-10-22 LAB — MAGNESIUM: Magnesium: 1.6 mg/dL — ABNORMAL LOW (ref 1.7–2.4)

## 2019-10-22 MED ORDER — POTASSIUM CHLORIDE 10 MEQ/100ML IV SOLN
10.0000 meq | INTRAVENOUS | Status: AC
  Administered 2019-10-22 (×2): 10 meq via INTRAVENOUS
  Filled 2019-10-22: qty 100

## 2019-10-22 MED ORDER — MAGNESIUM SULFATE 2 GM/50ML IV SOLN
2.0000 g | Freq: Once | INTRAVENOUS | Status: AC
  Administered 2019-10-22: 2 g via INTRAVENOUS
  Filled 2019-10-22: qty 50

## 2019-10-22 NOTE — Progress Notes (Addendum)
Inpatient Diabetes Program Recommendations  AACE/ADA: New Consensus Statement on Inpatient Glycemic Control (2015)  Target Ranges:  Prepandial:   less than 140 mg/dL      Peak postprandial:   less than 180 mg/dL (1-2 hours)      Critically ill patients:  140 - 180 mg/dL   Results for Maria Bentley, Maria RUFTY "CINDY" (MRN [id number]) as of 10/22/2019 10:39  Ref. Range 10/21/2019 05:34 10/21/2019 08:00 10/21/2019 09:04 10/21/2019 09:58 10/21/2019 11:39 10/21/2019 16:48 10/21/2019 20:49 10/21/2019 23:14 10/22/2019 03:27 10/22/2019 08:08  Glucose-Capillary Latest Ref Range: 70 - 99 mg/dL 79 66 (L) 84 131 (H)  Lantus 20 units 117 (H) 377 (H)  Novolog 20 units 419 (H)  Novolog 20 units @ 21:23  Lantus 20 units   Novolog 7 units  72 102 (H)     Lantus 20 units @9 :18   Review of Glycemic Control  Current orders for Inpatient glycemic control: Lantus 20 units BID, Novolog 0-20 units Q4H; Decadron 6 mg daily, Vital @ 70 ml/hr from 6pm to 6am  Inpatient Diabetes Program Recommendations:   Insulin - Tube Feeding Coverage: Noted Vital @ 70 ml/hr from 6pm to 6 am and glucose more elevated during the night (up to 419 mg/dl last night). Please consider ordering Novolog 5 units TID (at 8pm, 12am, and 4am) so patient will receive Novolog coverage for tube feeding.  NOTE: In reviewing chart, noted glucose of 66 mg/dl on 10/21/19@8am  and Lantus was decreased from 30 units BID to 20 units BID on 10/21/19. Glucose noted to be up to 419 mg/dl at 20:49 on 10/21/19 and following glucose in chart 72 mg/dl at 3:27 am. Anticipate glucose of 72 mg/dl likely due to Novolog correction (received 20 units at 21:23 and Novolog 7 units at 23:14). Patient has Vital @ 70 ml/hr running from 6pm to 6am and glucose noted to be higher during the night. Recommend ordering Novolog tube feeding coverage during the time the tube feeding is infusing.   Thanks, Barnie Alderman, RN, MSN, CDE Diabetes Coordinator Inpatient  Diabetes Program [phone] (Team Pager from 8am to 5pm)

## 2019-10-22 NOTE — Progress Notes (Signed)
ANTICOAGULATION CONSULT NOTE - follow up consult  Pharmacy Consult for Heparin Indication: VTE prophylaxis, COVID-PACT Trial (prophylaxis dose arm)   Allergies  Allergen Reactions  . Amoxicillin Hives and Rash  . Avocado Swelling  . Clarithromycin Hives and Rash  . Honey Anaphylaxis  . Other Hives and Swelling    All melon  . Sulfa Drugs Cross Reactors Anaphylaxis  . Watermelon Concentrate Hives and Swelling    Oral swelling, ears swell shut  . Metoprolol Other (See Comments)    Hypotension  . Adhesive [Tape] Itching    Skin breakdown  . Levemir [Insulin Detemir] Rash    Red/swollen injection site reactions with residual lumps  . Victoza [Liraglutide] Nausea Only    Patient Measurements: Height: 5\' 4"  (162.6 cm) Weight: 220 lb 7.4 oz (100 kg) IBW/kg (Calculated) : 54.7  Vital Signs: Temp: 99 F (37.2 C) (11/11 0325) Temp Source: Oral (11/11 0325) BP: 142/83 (11/11 0325) Pulse Rate: 82 (11/11 0325)  Labs: Recent Labs    10/20/19 0855 10/21/19 0550 10/22/19 0305  HGB 11.3* 12.0 11.2*  HCT 37.4 40.2 37.2  PLT 452* 496* 477*  LABPROT 13.0 12.2 12.4  INR 1.0 0.9 0.9  CREATININE 1.52* 1.39* 1.16*    Estimated Creatinine Clearance: 67.4 mL/min (A) (by C-G formula based on SCr of 1.16 mg/dL (H)).  Medications:  Scheduled:  . Chlorhexidine Gluconate Cloth  6 each Topical Q0600  . dexamethasone  6 mg Per Tube Daily  . escitalopram  20 mg Per Tube Daily  . feeding supplement (PRO-STAT SUGAR FREE 64)  30 mL Per Tube TID  . feeding supplement (VITAL AF 1.2 CAL)  1,000 mL Per Tube Q24H  . free water  200 mL Per Tube Q4H  . heparin injection (subcutaneous)  5,000 Units Subcutaneous Q8H  . insulin aspart  0-20 Units Subcutaneous Q4H  . insulin glargine  20 Units Subcutaneous BID  . lamoTRIgine  150 mg Per Tube Daily  . polyethylene glycol  17 g Oral BID  . sennosides  5 mL Per Tube BID  . sodium chloride flush  3 mL Intravenous Q12H  . vitamin C  500 mg Per Tube  Daily  . zinc sulfate  220 mg Per Tube Daily    Assessment: 48 yo  Female admitted 10/13/19 with DKA and COVID-19 pneumonia, intubated for hypoxemic respiratory failure. Enrolled into COVID-PACT trial today, heparin VTE prophylaxis arm.  Hgb stable, Plts stablek, SCr down to 1.16 today  Goal of Therapy:  VTE prophlyaxis Monitor platelets by anticoagulation protocol: Yes   Plan:  Continue Heparin 5000 units SQ q8h Monitor CBC, signs/symptoms of bleeding  Albertina Parr, PharmD., BCPS Clinical Pharmacist Clinical phone for 10/22/19 until 5pm: [phone]

## 2019-10-22 NOTE — Progress Notes (Signed)
PROGRESS NOTE    Maria Bentley  [id number] DOB: 20-Dec-1970 DOA: 10/13/2019 PCP: [name], MD   Brief Narrative:  48 year old with poorly controlled diabetes mellitus, recurrent rectal abscess, morbid obesity status post gastric bypass with vitamin D deficiency, depression and anxiety admitted for [facility] as she was found obtunded.  Husband and her both had COVID-19 and was nearly bedridden prior to hospitalization.  Upon admission she was found to be obtunded and diabetic ketoacidosis.  She was intubated on 11/2, extubated 11/6.  Completed remdesivir from 11/2-11/6.   Assessment & Plan:   Active Problems:   OSA (obstructive sleep apnea)   Hyperlipidemia   Diabetes mellitus type 2, uncontrolled ([place])   Noncompliance with diabetes treatment   Hypertension   COVID-19 virus infection   History of gastric bypass   DKA, type 2 (HCC)   Acute respiratory failure with hypoxia ([place])   Acute respiratory disease due to COVID-19 virus   Pressure injury of skin   Acute respiratory failure secondary to COVID-19 pneumonia -Now extubated 11/6 -Completed remdesivir.  No convalescent plasma due to multiple allergies.  -Continue to wean off oxygen; turn it down to 2L [place] -Continue Decadron, should be able to wean off in the next 24-48 hours  Uncontrolled diabetes mellitus type 2, insulin-dependent Severe diabetic ketoacidosis, resolved -Hemoglobin A1c 14.7.  Intermittent episode of hypoglycemia therefore Lantus reduced to 20U BID  Acute kidney injury secondary to ATN/urinary retention -Now improving slowly.  Elevated troponin secondary to demand ischemia -Seen by cardiology  Migraine headaches -As needed meds  Chronic sinus tachycardia, longstanding -On clonidine  Patient progressing well with speech and swallow therapy.  Tolerating orals.  We will plan on discontinuing core track today.  Continue to make good progress with physical therapy now recommending home PT.  DVT  prophylaxis: Hep SQ Code Status: Full code Family Communication: None Disposition Plan: Hopefully home in next 24-48 hours once off oxygen    Subjective: Feels better, tolerating her breakfast.  Review of Systems Otherwise negative except as per HPI, including: General: Denies fever, chills, night sweats or unintended weight loss. Resp: Denies cough, wheezing, shortness of breath. Cardiac: Denies chest pain, palpitations, orthopnea, paroxysmal nocturnal dyspnea. GI: Denies abdominal pain, nausea, vomiting, diarrhea or constipation GU: Denies dysuria, frequency, hesitancy or incontinence MS: Denies muscle aches, joint pain or swelling Neuro: Denies headache, neurologic deficits (focal weakness, numbness, tingling), abnormal gait Psych: Denies anxiety, depression, SI/HI/AVH Skin: Denies new rashes or lesions ID: Denies sick contacts, exotic exposures, travel  Objective: Vitals:   10/21/19 0802 10/21/19 1620 10/21/19 1936 10/22/19 0325  BP: (!) 158/83 (!) 153/80 (!) 146/83 (!) 142/83  Pulse: 79 84 (!) 105 82  Resp: 16 18 16 18   Temp: 98.8 F (37.1 C) 98.2 F (36.8 C) 97.9 F (36.6 C) 99 F (37.2 C)  TempSrc: Oral Oral Oral Oral  SpO2: 91% 90% 95% 92%  Weight:      Height:       No intake or output data in the 24 hours ending 10/22/19 0735 Filed Weights   10/16/19 0500 10/16/19 1930 10/17/19 1200  Weight: 100.6 kg 100.6 kg 100 kg    Examination:  General exam: Appears calm and comfortable, 2 L nasal cannula Respiratory system: Diminished breath sounds at the bases Cardiovascular system: S1 & S2 heard, RRR. No JVD, murmurs, rubs, gallops or clicks. No pedal edema. Gastrointestinal system: Abdomen is nondistended, soft and nontender. No organomegaly or masses felt. Normal bowel sounds heard. Central nervous  system: Alert and oriented. No focal neurological deficits. Extremities: Symmetric 4+ x 5 power. Skin: No rashes, lesions or ulcers Psychiatry: Judgement and  insight appear normal. Mood & affect appropriate.     Data Reviewed:   CBC: Recent Labs  Lab 10/16/19 0500  10/17/19 0540 10/17/19 0550 10/18/19 0634 10/20/19 0855 10/21/19 0550 10/22/19 0305  WBC 10.9*  --  7.9  --  17.2* 7.9 6.5 7.0  NEUTROABS 8.7*  --  6.4  --  14.2*  --   --   --   HGB 10.6*   < > 10.7* 10.2* 12.1 11.3* 12.0 11.2*  HCT 35.1*   < > 36.1 30.0* 43.1 37.4 40.2 37.2  MCV 99.2  --  100.8*  --  104.9* 100.3* 99.5 99.5  PLT 372  --  355  --  469* 452* 496* 477*   < > = values in this interval not displayed.   Basic Metabolic Panel: Recent Labs  Lab 10/15/19 1700  10/16/19 0500  10/17/19 0540  10/18/19 0634 10/19/19 0540 10/20/19 0855 10/21/19 0550 10/22/19 0305  NA  --    < > 148*   < > 148*   < > 151* 153* 151* 149* 146*  K  --    < > 3.7   < > 4.5   < > 4.7 4.3 3.7 3.1* 3.3*  CL  --    < > 119*  --  120*  --  119* 120* 116* 110 108  CO2  --    < > 17*  --  18*  --  22 22 26 29 27   GLUCOSE  --    < > 203*  --  293*  --  125* 145* 142* 111* 59*  BUN  --    < > 84*  --  87*  --  79* 75* 58* 44* 37*  CREATININE  --    < > 3.16*  --  2.69*  --  2.37* 2.06* 1.52* 1.39* 1.16*  CALCIUM  --    < > 7.9*  --  8.2*  --  8.8* 9.1 8.9 8.7* 8.4*  MG 1.6*  --  2.3  --  2.2  --  2.3  --   --   --  1.6*  PHOS 4.2  --  3.9  --  5.1*  --  6.6*  --   --   --  3.9   < > = values in this interval not displayed.   GFR: Estimated Creatinine Clearance: 67.4 mL/min (A) (by C-G formula based on SCr of 1.16 mg/dL (H)). Liver Function Tests: Recent Labs  Lab 10/16/19 0500 10/17/19 0540 10/18/19 0634 10/20/19 0855 10/21/19 0550 10/22/19 0305  AST 22 15 19  11* 14*  --   ALT 17 20 24 19 20   --   ALKPHOS 78 75 98 73 73  --   BILITOT 0.5 0.8 0.3 0.6 0.3  --   PROT 5.3* 5.4* 6.1* 5.8* 6.2*  --   ALBUMIN 2.1* 2.0* 2.3* 2.2* 2.4* 2.5*   No results for input(s): LIPASE, AMYLASE in the last 168 hours. No results for input(s): AMMONIA in the last 168 hours. Coagulation  Profile: Recent Labs  Lab 10/18/19 0634 10/19/19 1040 10/20/19 0855 10/21/19 0550 10/22/19 0305  INR 1.1 1.1 1.0 0.9 0.9   Cardiac Enzymes: No results for input(s): CKTOTAL, CKMB, CKMBINDEX, TROPONINI in the last 168 hours. BNP (last 3 results) No results for input(s): PROBNP in the last 8760  hours. HbA1C: No results for input(s): HGBA1C in the last 72 hours. CBG: Recent Labs  Lab 10/21/19 0958 10/21/19 1139 10/21/19 1648 10/21/19 2049 10/22/19 0327  GLUCAP 131* 117* 377* 419* 72   Lipid Profile: Recent Labs    10/20/19 0855  TRIG 206*   Thyroid Function Tests: No results for input(s): TSH, T4TOTAL, FREET4, T3FREE, THYROIDAB in the last 72 hours. Anemia Panel: No results for input(s): VITAMINB12, FOLATE, FERRITIN, TIBC, IRON, RETICCTPCT in the last 72 hours. Sepsis Labs: No results for input(s): PROCALCITON, LATICACIDVEN in the last 168 hours.  Recent Results (from the past 240 hour(s))  Blood Culture (routine x 2)     Status: None   Collection Time: 10/13/19  9:04 AM   Specimen: BLOOD  Result Value Ref Range Status   Specimen Description   Final    BLOOD LEFT [facility] Performed at [facility], [address] [place], [place] [address]    Special Requests   Final    BOTTLES DRAWN AEROBIC AND ANAEROBIC Blood Culture adequate volume Performed at [facility], [address] [place], [place] [address]    Culture   Final    NO GROWTH 5 DAYS Performed at [facility], [place] [address], [place], [place] [address]    Report Status 10/18/2019 FINAL  Final  Blood Culture (routine x 2)     Status: None   Collection Time: 10/13/19  9:04 AM   Specimen: BLOOD  Result Value Ref Range Status   Specimen Description BLOOD RIGHT FA  Final   Special Requests   Final    BOTTLES DRAWN AEROBIC AND ANAEROBIC Blood Culture adequate volume   Culture   Final    NO GROWTH 8 DAYS Performed at [facility], [address], [place], [place] [address]     Report Status 10/21/2019 FINAL  Final  Urine Culture     Status: Abnormal   Collection Time: 10/13/19  9:04 AM   Specimen: Urine, Random  Result Value Ref Range Status   Specimen Description   Final    URINE, RANDOM Performed at [facility], [address], [place], [place] [address]    Special Requests   Final    NONE Performed at [facility], [address] [place], [place] [address]    Culture MULTIPLE SPECIES PRESENT, SUGGEST RECOLLECTION (A)  Final   Report Status 10/14/2019 FINAL  Final  MRSA PCR Screening     Status: None   Collection Time: 10/13/19 11:51 PM   Specimen: Nasal Mucosa; Nasopharyngeal  Result Value Ref Range Status   MRSA by PCR NEGATIVE NEGATIVE Final    Comment:        The GeneXpert MRSA Assay (FDA approved for NASAL specimens only), is one component of a comprehensive MRSA colonization surveillance program. It is not intended to diagnose MRSA infection nor to guide or monitor treatment for MRSA infections. Performed at [facility], [place], [place], [place] [address]          Radiology Studies: No results found.      Scheduled Meds: . Chlorhexidine Gluconate Cloth  6 each Topical Q0600  . dexamethasone  6 mg Per Tube Daily  . escitalopram  20 mg Per Tube Daily  . feeding supplement (PRO-STAT SUGAR FREE 64)  30 mL Per Tube TID  . feeding supplement (VITAL AF 1.2 CAL)  1,000 mL Per Tube Q24H  . free water  200 mL Per Tube  Q4H  . heparin injection (subcutaneous)  5,000 Units Subcutaneous Q8H  . insulin aspart  0-20 Units Subcutaneous Q4H  . insulin glargine  20 Units Subcutaneous BID  . lamoTRIgine  150 mg Per Tube Daily  . polyethylene glycol  17 g Oral BID  . sennosides  5 mL Per Tube BID  . sodium chloride flush  3 mL Intravenous Q12H  . vitamin C  500 mg Per Tube Daily  . zinc sulfate  220 mg Per Tube Daily   Continuous Infusions: . sodium chloride 10 mL/hr at 10/21/19 1234      LOS: 9 days   Time spent= 25 mins    Veldon Wager Arsenio Loader, MD [facility]  If 7PM-7AM, please contact night-coverage  10/22/2019, 7:35 AM

## 2019-10-22 NOTE — Progress Notes (Signed)
Physical Therapy Treatment Patient Details Name: Maria Bentley MRN: [id number] DOB: 05/23/1971 Today's Date: 10/22/2019    History of Present Illness 48 y.o. female with a history of poorly-controlled diabetes, recurrent rectal abscesses, morbid obesity s/p gastric bypass with vitamin D deficiency, depression and anxiety who presented to the ED at [facility] 11/2 after being found on the bedroom floor by her husband (who is also sick with Covid). Pt admitted with covid PNA, uncontrolled DM with DKA, and AKI. On admission to [facility] ICU was obtunded and intubated on arrival; extubated 11/6. Pt reports h/o migraines with vertigo/nausea    PT Comments    Patient much more mindful of her safety and not impulsive. She did desaturate to 89% on 2L with walking 15 ft. Educated on pursed lip breathing and increased to 94% after 1 minute. Earlier very off-balance with Therapist, sports. Pt reports she has a migraine and gets vertigo/nausea with her migraines. Utilized RW during session with only one LOB requiring min assist to recover.     Follow Up Recommendations  Home health PT     Equipment Recommendations  Other (comment)(TBA; needed RW with migraine/vertigo)    Recommendations for Other Services       Precautions / Restrictions Precautions Precautions: Fall Precaution Comments: impulsive, coretrac Restrictions Weight Bearing Restrictions: No    Mobility  Bed Mobility Overal bed mobility: Needs Assistance Bed Mobility: Supine to Sit;Sit to Supine     Supine to sit: HOB elevated;Min assist     General bed mobility comments: incr effort and ultimately min assist to elevate torso  Transfers Overall transfer level: Needs assistance Equipment used: Rolling walker (2 wheeled) Transfers: Sit to/from [facility] Sit to Stand: Min guard Stand pivot transfers: Min guard       General transfer comment: due to vertigo and imbalance earlier with RN, pt agreed to use  RW  Ambulation/Gait Ambulation/Gait assistance: Min assist Gait Distance (Feet): 30 Feet Assistive device: Rolling walker (2 wheeled) Gait Pattern/deviations: Step-through pattern;Decreased stride length;Wide base of support(stagger backwards x 1)     General Gait Details: proper use of RW until she caught left wheel on BSC and causes stagger step backwards; she could not get RW untangled herself and required assist   Stairs             Wheelchair Mobility    Modified Rankin (Stroke Patients Only)       Balance Overall balance assessment: Needs assistance Sitting-balance support: Feet supported Sitting balance-Leahy Scale: Fair     Standing balance support: Single extremity supported;No upper extremity supported;During functional activity Standing balance-Leahy Scale: Poor Standing balance comment: external assist for balance                            Cognition Arousal/Alertness: Awake/alert Behavior During Therapy: WFL for tasks assessed/performed Overall Cognitive Status: Within Functional Limits for tasks assessed                                 General Comments: able to state she was having a migraine and gets very unsteady due to vertigo with migraine; moving slowly and asking PT if I was ready for her to move (mindful of lines/safety)      Exercises General Exercises - Lower Extremity Ankle Circles/Pumps: AROM;Both;10 reps Long Arc Quad: AROM;Both;10 reps Hip Flexion/Marching: AROM;Both;5 reps Other Exercises Other Exercises: Issued and educated on  use of IS; x 5 breaths    General Comments        Pertinent Vitals/Pain Pain Assessment: 0-10 Pain Score: 8  Pain Location: head Pain Descriptors / Indicators: Headache Pain Intervention(s): Limited activity within patient's tolerance;Monitored during session;Premedicated before session;Repositioned    Home Living                      Prior Function             PT Goals (current goals can now be found in the care plan section) Acute Rehab PT Goals Patient Stated Goal: "coffee" PT Goal Formulation: Patient unable to participate in goal setting Time For Goal Achievement: 11/03/19 Potential to Achieve Goals: Good Progress towards PT goals: Progressing toward goals    Frequency    Min 3X/week      PT Plan Current plan remains appropriate    Co-evaluation              AM-PAC PT "6 Clicks" Mobility   Outcome Measure  Help needed turning from your back to your side while in a flat bed without using bedrails?: A Little Help needed moving from lying on your back to sitting on the side of a flat bed without using bedrails?: A Little Help needed moving to and from a bed to a chair (including a wheelchair)?: A Little Help needed standing up from a chair using your arms (e.g., wheelchair or bedside chair)?: A Little Help needed to walk in hospital room?: A Little Help needed climbing 3-5 steps with a railing? : A Lot 6 Click Score: 17    End of Session Equipment Utilized During Treatment: Oxygen Activity Tolerance: Patient tolerated treatment well Patient left: with call bell/phone within reach;in chair;with chair alarm set   PT Visit Diagnosis: Unsteadiness on feet (R26.81)     Time: :[id number] PT Time Calculation (min) (ACUTE ONLY): 29 min  Charges:  $Gait Training: 8-22 mins $Therapeutic Exercise: 8-22 mins                      Barry Brunner, PT     Rexanne Mano 10/22/2019, 12:41 PM

## 2019-10-22 NOTE — Progress Notes (Signed)
  Speech Language Pathology Treatment: Dysphagia  Patient Details Name: Maria Bentley MRN: [id number] DOB: 1971/04/15 Today's Date: 10/22/2019 Time: 1015-1030 SLP Time Calculation (min) (ACUTE ONLY): 15 min  Assessment / Plan / Recommendation Clinical Impression  Pt alert and appropriate this am, oriented and able to recall recent events. When asked about her Telesitter pt says "that thing said I was trying to get up but I wasn't, I was just repositioning myself." RN confirms that pt has been appropriate, but two days ago she attempted to get out of bed.  Pt followed instruction with decreased cueing needed today, pt demonstrates increased independence in appropriate self care. She says the MD wants her to move around her room more today. She reports eating breakfast, named items on tray and RN confirms modest intake. When observed with nectar thick liquids as a trial of upgraded texture intermittent coughing still present. Suspect Cortrak tube may also be impeding swallow slightly at this point. Pt demonstrates ability to nutritionally support herself, recommend Cortrak removal.   HPI HPI: Pt is a 48 yo female admitted with DKA and COVID-19 requiring ETT 11/2-11/16. PMH includes obesity s/p gastric bypass, HTN, HLD, DM, anxiety, depression, endometriosis      SLP Plan  Continue with current plan of care       Recommendations  Diet recommendations: Dysphagia 3 (mechanical soft);Honey-thick liquid Liquids provided via: Cup Medication Administration: Whole meds with puree Supervision: Patient able to self feed Compensations: Slow rate;Small sips/bites Postural Changes and/or Swallow Maneuvers: Seated upright 90 degrees                Follow up Recommendations: None SLP Visit Diagnosis: Dysphagia, unspecified (R13.10) Plan: Continue with current plan of care       GO               Herbie Baltimore, MA [place] Pager [phone] Office  [phone]  Maria Bentley 10/22/2019, 11:31 AM

## 2019-10-22 NOTE — Telephone Encounter (Signed)
LMTCB to schedule f/u appt

## 2019-10-23 ENCOUNTER — Other Ambulatory Visit: Payer: BC Managed Care – PPO

## 2019-10-23 LAB — GLUCOSE, CAPILLARY
Glucose-Capillary: 164 mg/dL — ABNORMAL HIGH (ref 70–99)
Glucose-Capillary: 184 mg/dL — ABNORMAL HIGH (ref 70–99)
Glucose-Capillary: 187 mg/dL — ABNORMAL HIGH (ref 70–99)
Glucose-Capillary: 219 mg/dL — ABNORMAL HIGH (ref 70–99)
Glucose-Capillary: 308 mg/dL — ABNORMAL HIGH (ref 70–99)
Glucose-Capillary: 44 mg/dL — CL (ref 70–99)
Glucose-Capillary: 56 mg/dL — ABNORMAL LOW (ref 70–99)
Glucose-Capillary: 66 mg/dL — ABNORMAL LOW (ref 70–99)
Glucose-Capillary: 67 mg/dL — ABNORMAL LOW (ref 70–99)
Glucose-Capillary: 70 mg/dL (ref 70–99)

## 2019-10-23 LAB — PROTIME-INR
INR: 1 (ref 0.8–1.2)
Prothrombin Time: 12.9 seconds (ref 11.4–15.2)

## 2019-10-23 LAB — FIBRINOGEN: Fibrinogen: 538 mg/dL — ABNORMAL HIGH (ref 210–475)

## 2019-10-23 MED ORDER — IPRATROPIUM-ALBUTEROL 20-100 MCG/ACT IN AERS: 1.0000 | INHALATION_SPRAY | Freq: Four times a day (QID) | RESPIRATORY_TRACT | Status: DC

## 2019-10-23 MED ORDER — DEXTROSE 50 % IV SOLN
INTRAVENOUS | Status: AC
  Administered 2019-10-23: 50 mL
  Filled 2019-10-23: qty 50

## 2019-10-23 MED ORDER — INSULIN GLARGINE 100 UNIT/ML ~~LOC~~ SOLN
20.0000 [IU] | Freq: Every day | SUBCUTANEOUS | Status: DC
  Administered 2019-10-23 – 2019-10-25 (×3): 20 [IU] via SUBCUTANEOUS
  Filled 2019-10-23 (×3): qty 0.2

## 2019-10-23 MED ORDER — CYCLOBENZAPRINE HCL 5 MG PO TABS
10.0000 mg | ORAL_TABLET | Freq: Three times a day (TID) | ORAL | Status: DC | PRN
  Administered 2019-10-23 – 2019-10-25 (×5): 10 mg via ORAL
  Filled 2019-10-23 (×2): qty 2
  Filled 2019-10-23: qty 1
  Filled 2019-10-23 (×2): qty 2
  Filled 2019-10-23 (×3): qty 1

## 2019-10-23 NOTE — Telephone Encounter (Signed)
   Primary Cardiologist: Kathlyn [place], MD  Chart reviewed as part of pre-operative protocol coverage. She had an echocardiogram 10/14/2019 which showed EF 60-65%, G1DD, no LVH, and no significant valvular abnormalities.   Left a voicemail for patient to call back for ongoing preoperative assessment.    Abigail Butts, PA-C 10/23/2019, 2:45 PM

## 2019-10-23 NOTE — TOC Initial Note (Signed)
Transition of Care [facility]) - Initial/Assessment Note    Patient Details  Name: Maria Bentley MRN: [id number] Date of Birth: 1971/08/09  Transition of Care [facility]) CM/SW Contact:    Carles Collet, RN Phone Number: 10/23/2019, 3:55 PM  Clinical Narrative:                Could not reach patient on the phone, spoke w spouse. He states plan will be to go home at DC. Patient has RW at home.  Discussed DC plan, and [place] services. Discussed medicare ratings. Referral accepted by [facility]. Patient will need [place] orders prior to DC.    Expected Discharge Plan: [place] Barriers to Discharge: Continued Medical Work up   Patient Goals and CMS Choice Patient states their goals for this hospitalization and ongoing recovery are:: to go home, spoke w spouse CMS Medicare.gov Compare Post Acute Care list provided to:: Other (Comment Required)(could not reach patient) Choice offered to / list presented to : Spouse  Expected Discharge Plan and Services Expected Discharge Plan: [place]                                              Prior Living Arrangements/Services                       Activities of Daily Living Home Assistive Devices/Equipment: None ADL Screening (condition at time of admission) Patient's cognitive ability adequate to safely complete daily activities?: Yes Is the patient deaf or have difficulty hearing?: No Does the patient have difficulty seeing, even when wearing glasses/contacts?: No Does the patient have difficulty concentrating, remembering, or making decisions?: No Patient able to express need for assistance with ADLs?: No Does the patient have difficulty dressing or bathing?: No Independently performs ADLs?: Yes (appropriate for developmental age) Does the patient have difficulty walking or climbing stairs?: No Weakness of Legs: None Weakness of Arms/Hands: None  Permission Sought/Granted                   Emotional Assessment              Admission diagnosis:  COVID 19 DKA Patient Active Problem List   Diagnosis Date Noted  . Pressure injury of skin 10/16/2019  . History of gastric bypass 10/13/2019  . DKA, type 2 ([place]) 10/13/2019  . Acute respiratory failure with hypoxia ([place]) 10/13/2019  . Acute respiratory disease due to COVID-19 virus 10/13/2019  . COVID-19 virus infection 10/06/2019  . Preoperative evaluation of a medical condition to rule out surgical contraindications (TAR required) 09/21/2019  . Abscess of right buttock 04/30/2019  . Chronic pain 06/05/2018  . Iron deficiency anemia 06/05/2018  . Diarrhea of presumed infectious origin 04/21/2018  . Sepsis ([place]) 03/11/2018  . Paronychia of great toe of left foot 11/24/2017  . Hypertension 11/24/2017  . Pelvic mass in female 04/05/2017  . Tendonitis 03/30/2016  . Left ankle sprain 03/30/2016  . Peroneal tendonitis 01/13/2016  . Hip pain, chronic 12/28/2015  . Pain in joint, ankle and foot 12/28/2015  . Constipation 12/27/2015  . 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 Noncompliance with diabetes treatment 06/08/2015  . Right hip pain 05/30/2014  . Vitamin D deficiency 05/30/2014  . Tachycardia 08/15/2013  . Diabetes mellitus type 2, uncontrolled ([place]) 08/15/2013  . Inappropriate sinus  tachycardia 07/15/2013  . Dyspnea 07/15/2013  . Morbid obesity ([place]) 06/09/2012  . Anxiety   . Headache, common migraine, intractable   . Hyperlipidemia   . OSA (obstructive sleep apnea) 02/05/2012   PCP:  [name], MD Pharmacy:   [facility], [place] [address] [place] [address] Phone: [phone] Fax: [phone]     Social Determinants of Health (SDOH) Interventions    Readmission Risk Interventions No flowsheet data found.

## 2019-10-23 NOTE — Progress Notes (Signed)
PROGRESS NOTE    Maria Bentley  [id number] DOB: Nov 27, 1971 DOA: 10/13/2019 PCP: [name], MD   Brief Narrative:  48 year old with poorly controlled diabetes mellitus, recurrent rectal abscess, morbid obesity status post gastric bypass with vitamin D deficiency, depression and anxiety admitted for [facility] as she was found obtunded.  Husband and her both had COVID-19 and was nearly bedridden prior to hospitalization.  Upon admission she was found to be obtunded and diabetic ketoacidosis.  She was intubated on 11/2, extubated 11/6.  Completed remdesivir from 11/2-11/6.   Assessment & Plan:   Active Problems:   OSA (obstructive sleep apnea)   Hyperlipidemia   Diabetes mellitus type 2, uncontrolled ([place])   Noncompliance with diabetes treatment   Hypertension   COVID-19 virus infection   History of gastric bypass   DKA, type 2 (H[facility])   Acute respiratory failure with hypoxia ([place])   Acute respiratory disease due to COVID-19 virus   Pressure injury of skin   Acute respiratory failure secondary to COVID-19 pneumonia -Now extubated 11/6 -Completed remdesivir.  No convalescent plasma due to multiple allergies.  -Off O2 this morning.  -Stop decadron today.   Uncontrolled diabetes mellitus type 2, insulin-dependent. Intermittent hypoglycemia Severe diabetic ketoacidosis, resolved -Hemoglobin A1c 14.7.  Reduce dose of Lantus to 20U daily. Steroids stopped.   Acute kidney injury secondary to ATN/urinary retention -Now improving slowly.  Elevated troponin secondary to demand ischemia -Seen by cardiology  Migraine headaches -As needed meds  Chronic sinus tachycardia, longstanding -On clonidine  Patient progressing well with speech and swallow therapy.  Tolerating orals.  We will plan on discontinuing core track today.  PT/OT=  Home heatlh  DVT prophylaxis: Hep SQ Code Status: Full code Family Communication: Spoke with her husband.  Disposition Plan: Discharge  tomorrow if Blood glucose remains stable. Will need to monitor closely today due to concerns of significant hypoglycemia.     Subjective: Feels better. Overnight hypoglycemia.   Review of Systems Otherwise negative except as per HPI, including: General = no fevers, chills, dizziness, malaise, fatigue HEENT/EYES = negative for pain, redness, loss of vision, double vision, blurred vision, loss of hearing, sore throat, hoarseness, dysphagia Cardiovascular= negative for chest pain, palpitation, murmurs, lower extremity swelling Respiratory/lungs= negative for shortness of breath, cough, hemoptysis, wheezing, mucus production Gastrointestinal= negative for nausea, vomiting,, abdominal pain, melena, hematemesis Genitourinary= negative for Dysuria, Hematuria, Change in Urinary Frequency MSK = Negative for arthralgia, myalgias, Back Pain, Joint swelling  Neurology= Negative for headache, seizures, numbness, tingling  Psychiatry= Negative for anxiety, depression, suicidal and homocidal ideation Allergy/Immunology= Medication/Food allergy as listed  Skin= Negative for Rash, lesions, ulcers, itching   Objective: Vitals:   10/23/19 0400 10/23/19 0700 10/23/19 0701 10/23/19 0958  BP: (!) 158/82 (!) 142/65 (!) 142/65   Pulse: 79 86 86   Resp: 18 20 20    Temp: 98.3 F (36.8 C) 98.7 F (37.1 C) 98.7 F (37.1 C)   TempSrc: Oral Oral Oral   SpO2: 93% 93% 93% 93%  Weight:      Height:        Intake/Output Summary (Last 24 hours) at 10/23/2019 1014 Last data filed at 10/22/2019 1430 Gross per 24 hour  Intake 390 ml  Output -  Net 390 ml   Filed Weights   10/16/19 0500 10/16/19 1930 10/17/19 1200  Weight: 100.6 kg 100.6 kg 100 kg    Examination:  Constitutional: Not in acute distress Respiratory: Clear to auscultation bilaterally Cardiovascular: Normal sinus rhythm, no  rubs Abdomen: Nontender nondistended good bowel sounds Musculoskeletal: No edema noted Skin: No rashes seen  Neurologic: CN 2-12 grossly intact.  And nonfocal Psychiatric: Normal judgment and insight. Alert and oriented x 3. Normal mood.    Data Reviewed:   CBC: Recent Labs  Lab 10/17/19 0540 10/17/19 0550 10/18/19 0634 10/20/19 0855 10/21/19 0550 10/22/19 0305  WBC 7.9  --  17.2* 7.9 6.5 7.0  NEUTROABS 6.4  --  14.2*  --   --   --   HGB 10.7* 10.2* 12.1 11.3* 12.0 11.2*  HCT 36.1 30.0* 43.1 37.4 40.2 37.2  MCV 100.8*  --  104.9* 100.3* 99.5 99.5  PLT 355  --  469* 452* 496* [id number]*   Basic Metabolic Panel: Recent Labs  Lab 10/17/19 0540  10/18/19 0634 10/19/19 0540 10/20/19 0855 10/21/19 0550 10/22/19 0305  NA 148*   < > 151* 153* 151* 149* 146*  K 4.5   < > 4.7 4.3 3.7 3.1* 3.3*  CL 120*  --  119* 120* 116* 110 108  CO2 18*  --  22 22 26 29 27   GLUCOSE 293*  --  125* 145* 142* 111* 59*  BUN 87*  --  79* 75* 58* 44* 37*  CREATININE 2.69*  --  2.37* 2.06* 1.52* 1.39* 1.16*  CALCIUM 8.2*  --  8.8* 9.1 8.9 8.7* 8.4*  MG 2.2  --  2.3  --   --   --  1.6*  PHOS 5.1*  --  6.6*  --   --   --  3.9   < > = values in this interval not displayed.   GFR: Estimated Creatinine Clearance: 67.4 mL/min (A) (by C-G formula based on SCr of 1.16 mg/dL (H)). Liver Function Tests: Recent Labs  Lab 10/17/19 0540 10/18/19 0634 10/20/19 0855 10/21/19 0550 10/22/19 0305  AST 15 19 11* 14*  --   ALT 20 24 19 20   --   ALKPHOS 75 98 73 73  --   BILITOT 0.8 0.3 0.6 0.3  --   PROT 5.4* 6.1* 5.8* 6.2*  --   ALBUMIN 2.0* 2.3* 2.2* 2.4* 2.5*   No results for input(s): LIPASE, AMYLASE in the last 168 hours. No results for input(s): AMMONIA in the last 168 hours. Coagulation Profile: Recent Labs  Lab 10/19/19 1040 10/20/19 0855 10/21/19 0550 10/22/19 0305 10/23/19 0035  INR 1.1 1.0 0.9 0.9 1.0   Cardiac Enzymes: No results for input(s): CKTOTAL, CKMB, CKMBINDEX, TROPONINI in the last 168 hours. BNP (last 3 results) No results for input(s): PROBNP in the last 8760 hours. HbA1C: No  results for input(s): HGBA1C in the last 72 hours. CBG: Recent Labs  Lab 10/23/19 0006 10/23/19 0048 10/23/19 0121 10/23/19 0518 10/23/19 0827  GLUCAP 44* 66* 70 56* 67*   Lipid Profile: No results for input(s): CHOL, HDL, LDLCALC, TRIG, CHOLHDL, LDLDIRECT in the last 72 hours. Thyroid Function Tests: No results for input(s): TSH, T4TOTAL, FREET4, T3FREE, THYROIDAB in the last 72 hours. Anemia Panel: No results for input(s): VITAMINB12, FOLATE, FERRITIN, TIBC, IRON, RETICCTPCT in the last 72 hours. Sepsis Labs: No results for input(s): PROCALCITON, LATICACIDVEN in the last 168 hours.  Recent Results (from the past 240 hour(s))  MRSA PCR Screening     Status: None   Collection Time: 10/13/19 11:51 PM   Specimen: Nasal Mucosa; Nasopharyngeal  Result Value Ref Range Status   MRSA by PCR NEGATIVE NEGATIVE Final    Comment:  The GeneXpert MRSA Assay (FDA approved for NASAL specimens only), is one component of a comprehensive MRSA colonization surveillance program. It is not intended to diagnose MRSA infection nor to guide or monitor treatment for MRSA infections. Performed at [facility], [place] [address], [place], [place] [address]          Radiology Studies: No results found.      Scheduled Meds: . Chlorhexidine Gluconate Cloth  6 each Topical Q0600  . dexamethasone  6 mg Per Tube Daily  . escitalopram  20 mg Per Tube Daily  . feeding supplement (PRO-STAT SUGAR FREE 64)  30 mL Per Tube TID  . feeding supplement (VITAL AF 1.2 CAL)  1,000 mL Per Tube Q24H  . heparin injection (subcutaneous)  5,000 Units Subcutaneous Q8H  . insulin aspart  0-20 Units Subcutaneous Q4H  . insulin glargine  20 Units Subcutaneous Daily  . lamoTRIgine  150 mg Per Tube Daily  . polyethylene glycol  17 g Oral BID  . sennosides  5 mL Per Tube BID  . sodium chloride flush  3 mL Intravenous Q12H  . vitamin C  500 mg Per Tube Daily  . zinc sulfate  220 mg Per  Tube Daily   Continuous Infusions: . sodium chloride 10 mL/hr at 10/21/19 1234     LOS: 10 days   Time spent= 25 mins    Janathan Bribiesca Arsenio Loader, MD [facility]  If 7PM-7AM, please contact night-coverage  10/23/2019, 10:14 AM

## 2019-10-23 NOTE — Progress Notes (Signed)
Husband called and updated on patient status and plan of care. All questions answered.

## 2019-10-23 NOTE — Progress Notes (Signed)
Occupational Therapy Treatment Patient Details Name: Maria Bentley MRN: [id number] DOB: [birth year] Today's Date: 10/23/2019    History of present illness 48 y.o. female with a history of poorly-controlled diabetes, recurrent rectal abscesses, morbid obesity s/p gastric bypass with vitamin D deficiency, depression and anxiety who presented to the ED at [facility] 11/2 after being found on the bedroom floor by her husband (who is also sick with Covid). Pt admitted with covid PNA, uncontrolled DM with DKA, and AKI. On admission to [facility] ICU was obtunded and intubated on arrival; extubated 11/6. Pt reports h/o migraines with vertigo/nausea   OT comments  Pt making good progress towards OT goals. Pt with improvements in cognition today and with good safety awareness, no impulsivity noted. Pt performing room level mobility using RW and standing grooming ADL at minguard assist level. Pt on RA throughout with SpO2 >/= 87%, max HR noted 126. Further discussed safety and energy conservation strategies during completion of functional tasks in anticipation for discharge home with pt verbalizing understanding. Given pt progress have updated d/c recommendations to reflect - now recommend [place] services post discharge. Will continue to follow while she remains in acute setting.   Follow Up Recommendations  Home health OT;Supervision/Assistance - 24 hour    Equipment Recommendations  None recommended by OT    Recommendations for Other Services      Precautions / Restrictions Precautions Precautions: Fall Precaution Comments: monitor O2 sats Restrictions Weight Bearing Restrictions: No       Mobility Bed Mobility Overal bed mobility: Modified Independent             General bed mobility comments: no assist required, from flat bed  Transfers Overall transfer level: Needs assistance Equipment used: Rolling walker (2 wheeled) Transfers: Sit to/from Stand Sit to Stand: Min guard          General transfer comment: for safety and balance    Balance Overall balance assessment: Needs assistance Sitting-balance support: Feet supported Sitting balance-Leahy Scale: Good     Standing balance support: Single extremity supported;No upper extremity supported;During functional activity Standing balance-Leahy Scale: Fair Standing balance comment: use of UE support for dynamic mobility                           ADL either performed or assessed with clinical judgement   ADL Overall ADL's : Needs assistance/impaired     Grooming: Oral care;Min guard;Standing Grooming Details (indicate cue type and reason): standing at sink in bathroom                             Functional mobility during ADLs: Min guard;Rolling walker General ADL Comments: pt with improvements in balance, cognition, and endurance; reviewed safety and EC strategies in anticipation for d/c home     Vision       Perception     Praxis      Cognition Arousal/Alertness: Awake/alert Behavior During Therapy: WFL for tasks assessed/performed Overall Cognitive Status: Within Functional Limits for tasks assessed                                 General Comments: much improved from initial eval        Exercises Exercises: Other exercises Other Exercises Other Exercises: use of IS x2, able to pull 1238ml   Shoulder Instructions  General Comments      Pertinent Vitals/ Pain       Pain Assessment: Faces Faces Pain Scale: Hurts little more Pain Location: reports chronic back pain Pain Descriptors / Indicators: Discomfort;Sore Pain Intervention(s): Repositioned;Monitored during session  Home Living                                          Prior Functioning/Environment              Frequency  Min 3X/week        Progress Toward Goals  OT Goals(current goals can now be found in the care plan section)  Progress towards OT goals:  Progressing toward goals  Acute Rehab OT Goals Patient Stated Goal: home tomorrow hopefully OT Goal Formulation: With patient Time For Goal Achievement: 11/03/19 Potential to Achieve Goals: Good  Plan Discharge plan needs to be updated;Frequency needs to be updated    Co-evaluation                 AM-PAC OT "6 Clicks" Daily Activity     Outcome Measure   Help from another person eating meals?: None Help from another person taking care of personal grooming?: A Little Help from another person toileting, which includes using toliet, bedpan, or urinal?: A Little Help from another person bathing (including washing, rinsing, drying)?: A Little Help from another person to put on and taking off regular upper body clothing?: None Help from another person to put on and taking off regular lower body clothing?: A Little 6 Click Score: 20    End of Session Equipment Utilized During Treatment: Rolling walker;Gait belt  OT Visit Diagnosis: Unsteadiness on feet (R26.81);Other symptoms and signs involving cognitive function;Muscle weakness (generalized) (M62.81)   Activity Tolerance Patient tolerated treatment well   Patient Left in bed;with call bell/phone within reach   Nurse Communication Mobility status        Time: 1550-1610 OT Time Calculation (min): 20 min  Charges: OT General Charges $OT Visit: 1 Visit OT Treatments $Self Care/Home Management : 8-22 mins  Lou Cal, OT Supplemental Rehabilitation Services Pager [phone] Office [place] 10/23/2019, 4:55 PM

## 2019-10-24 ENCOUNTER — Inpatient Hospital Stay (HOSPITAL_COMMUNITY): Payer: BC Managed Care – PPO

## 2019-10-24 ENCOUNTER — Inpatient Hospital Stay: Admission: RE | Admit: 2019-10-24 | Payer: BC Managed Care – PPO | Source: Ambulatory Visit

## 2019-10-24 ENCOUNTER — Other Ambulatory Visit: Payer: BC Managed Care – PPO

## 2019-10-24 DIAGNOSIS — R0902 Hypoxemia: Secondary | ICD-10-CM

## 2019-10-24 LAB — GLUCOSE, CAPILLARY
Glucose-Capillary: 117 mg/dL — ABNORMAL HIGH (ref 70–99)
Glucose-Capillary: 120 mg/dL — ABNORMAL HIGH (ref 70–99)
Glucose-Capillary: 130 mg/dL — ABNORMAL HIGH (ref 70–99)
Glucose-Capillary: 130 mg/dL — ABNORMAL HIGH (ref 70–99)
Glucose-Capillary: 140 mg/dL — ABNORMAL HIGH (ref 70–99)
Glucose-Capillary: 146 mg/dL — ABNORMAL HIGH (ref 70–99)
Glucose-Capillary: 155 mg/dL — ABNORMAL HIGH (ref 70–99)
Glucose-Capillary: 192 mg/dL — ABNORMAL HIGH (ref 70–99)
Glucose-Capillary: 220 mg/dL — ABNORMAL HIGH (ref 70–99)
Glucose-Capillary: 65 mg/dL — ABNORMAL LOW (ref 70–99)
Glucose-Capillary: 96 mg/dL (ref 70–99)

## 2019-10-24 LAB — PROTIME-INR
INR: 0.9 (ref 0.8–1.2)
Prothrombin Time: 12.3 seconds (ref 11.4–15.2)

## 2019-10-24 LAB — FIBRINOGEN: Fibrinogen: 523 mg/dL — ABNORMAL HIGH (ref 210–475)

## 2019-10-24 MED ORDER — DEXAMETHASONE SODIUM PHOSPHATE 4 MG/ML IJ SOLN
4.0000 mg | INTRAMUSCULAR | Status: DC
  Administered 2019-10-24 – 2019-10-25 (×2): 4 mg via INTRAVENOUS
  Filled 2019-10-24 (×2): qty 1

## 2019-10-24 MED ORDER — FUROSEMIDE 10 MG/ML IJ SOLN
40.0000 mg | Freq: Once | INTRAMUSCULAR | Status: AC
  Administered 2019-10-24: 40 mg via INTRAVENOUS
  Filled 2019-10-24: qty 4

## 2019-10-24 MED ORDER — LOPERAMIDE HCL 2 MG PO CAPS
4.0000 mg | ORAL_CAPSULE | ORAL | Status: DC | PRN
  Administered 2019-10-24 (×3): 4 mg via ORAL
  Filled 2019-10-24 (×3): qty 2

## 2019-10-24 NOTE — Telephone Encounter (Signed)
I have faxed notes to [facility] as FYI.

## 2019-10-24 NOTE — Plan of Care (Signed)

## 2019-10-24 NOTE — Telephone Encounter (Addendum)
   Primary Cardiologist: Kathlyn [place], MD  Chart reviewed as part of pre-operative protocol coverage. Patient was contacted 10/24/2019 in reference to pre-operative risk assessment for pending surgery as outlined below.  Maria Bentley was last seen on 08/29/19 by Dr. Fletcher Anon for elevated blood pressure and inappropriate sinus tachycardia. Also has history of rare PACs/ PVCs, DM, morbid obesity s/p gastric bypass, chronic daily headaches. Diltiazem was increased at last visit. 2D echo was planned to exclude tachy-mediated cardiomyopathy.   It appears the patient did not initially have her echo done as planned then was seen in the ED 10/25 with shortness of breath and chest pain in the setting of testing positive for Covid. She has since admitted 11/2 with acute respiratory failure secondary to Covid-19 pneumonia, and remains admitted at this time. 2D echo was done 11/3 and showed EF 60-65%, grade 1 DD, mildly reduced RV function. Earlier in notes below it appears patient's primary care declined medical clearance due to her diabetes and being high risk.  Given unprecedented situation from a pre-op standpoint given active Covid, I will route to MD for input on what needs to happen next from clearance standpoint. Dr. Fletcher Anon, - Please route response to P CV DIV PREOP (the pre-op pool). Thank you. Will also route to our callback staff to call surgeon's office to make sure they are aware of her active Covid admission since this will likely affect their timing of surgery.  Charlie Pitter, PA-C 10/24/2019, 9:33 AM

## 2019-10-24 NOTE — Telephone Encounter (Signed)
   Primary Cardiologist:Muhammad Fletcher Anon, MD  Chart revisited as part of pre-operative protocol coverage. Per Dr. Fletcher Anon, "She is low risk from a cardiac standpoint.  However, she has other medical comorbidities and recent Covid that makes her not appropriate for elective surgeries at the present time.  She should be evaluated by her primary care physician to get medical clearance before proceeding." Will route this bundled recommendation to requesting provider via Epic fax function and remove from pre-op box. Please call with questions.  Charlie Pitter, PA-C 10/24/2019, 2:02 PM

## 2019-10-24 NOTE — Telephone Encounter (Signed)
She is low risk from a cardiac standpoint.  However, she has other medical comorbidities and recent Covid that makes her not appropriate for elective surgeries at the present time.  She should be evaluated by her primary care physician to get medical clearance before proceeding.

## 2019-10-24 NOTE — Telephone Encounter (Signed)
I will remove from the pre op call back pool per recommendations.

## 2019-10-24 NOTE — Progress Notes (Signed)
ANTICOAGULATION CONSULT NOTE - follow up consult  Pharmacy Consult for Heparin Indication: VTE prophylaxis, COVID-PACT Trial (prophylaxis dose arm)   Allergies  Allergen Reactions  . Amoxicillin Hives and Rash  . Avocado Swelling  . Clarithromycin Hives and Rash  . Honey Anaphylaxis  . Other Hives and Swelling    All melon  . Sulfa Drugs Cross Reactors Anaphylaxis  . Watermelon Concentrate Hives and Swelling    Oral swelling, ears swell shut  . Metoprolol Other (See Comments)    Hypotension  . Adhesive [Tape] Itching    Skin breakdown  . Levemir [Insulin Detemir] Rash    Red/swollen injection site reactions with residual lumps  . Victoza [Liraglutide] Nausea Only    Patient Measurements: Height: 5\' 4"  (162.6 cm) Weight: 220 lb 7.4 oz (100 kg) IBW/kg (Calculated) : 54.7  Vital Signs: Temp: 98.6 F (37 C) (11/13 0800) Temp Source: Oral (11/13 0800) BP: 121/60 (11/13 0800) Pulse Rate: 91 (11/13 0800)  Labs: Recent Labs    10/22/19 0305 10/23/19 0035 10/24/19 0101  HGB 11.2*  --   --   HCT 37.2  --   --   PLT 477*  --   --   LABPROT 12.4 12.9 12.3  INR 0.9 1.0 0.9  CREATININE 1.16*  --   --     Estimated Creatinine Clearance: 67.4 mL/min (A) (by C-G formula based on SCr of 1.16 mg/dL (H)).  Medications:  Scheduled:  . Chlorhexidine Gluconate Cloth  6 each Topical Q0600  . dexamethasone (DECADRON) injection  4 mg Intravenous Q24H  . escitalopram  20 mg Per Tube Daily  . feeding supplement (PRO-STAT SUGAR FREE 64)  30 mL Per Tube TID  . feeding supplement (VITAL AF 1.2 CAL)  1,000 mL Per Tube Q24H  . heparin injection (subcutaneous)  5,000 Units Subcutaneous Q8H  . insulin aspart  0-20 Units Subcutaneous Q4H  . insulin glargine  20 Units Subcutaneous Daily  . lamoTRIgine  150 mg Per Tube Daily  . polyethylene glycol  17 g Oral BID  . sennosides  5 mL Per Tube BID  . sodium chloride flush  3 mL Intravenous Q12H  . vitamin C  500 mg Per Tube Daily  . zinc  sulfate  220 mg Per Tube Daily    Assessment: 48 yo  Female admitted 10/13/19 with DKA and COVID-19 pneumonia, intubated for hypoxemic respiratory failure. Enrolled into COVID-PACT trial today, heparin VTE prophylaxis arm.   Last CBC 11/11 No bleeding reported .  Goal of Therapy:  VTE prophlyaxis Monitor platelets by anticoagulation protocol: Yes   Plan:  Continue Heparin 5000 units SQ q8h F/U CBC AM Monitor CBC, signs/symptoms of bleeding  Ulice Dash, PharmD, BCPS Clinical Pharmacist

## 2019-10-24 NOTE — Progress Notes (Signed)
PROGRESS NOTE    Maria Bentley  [id number] DOB: 1971-02-10 DOA: 10/13/2019 PCP: [name], MD   Brief Narrative:  48 year old with poorly controlled diabetes mellitus, recurrent rectal abscess, morbid obesity status post gastric bypass with vitamin D deficiency, depression and anxiety admitted for [facility] as she was found obtunded.  Husband and her both had COVID-19 and was nearly bedridden prior to hospitalization.  Upon admission she was found to be obtunded and diabetic ketoacidosis.  She was intubated on 11/2, extubated 11/6.  Completed remdesivir from 11/2-11/6.   Assessment & Plan:   Active Problems:   OSA (obstructive sleep apnea)   Hyperlipidemia   Diabetes mellitus type 2, uncontrolled ([place])   Noncompliance with diabetes treatment   Hypertension   COVID-19 virus infection   History of gastric bypass   DKA, type 2 (H[facility])   Acute respiratory failure with hypoxia ([place])   Acute respiratory disease due to COVID-19 virus   Pressure injury of skin   Acute respiratory failure secondary to COVID-19 pneumonia redeveloped hypoxia again -Now extubated 11/6.  Hypoxia to 85% on room air this morning chest x-ray stable -Completed remdesivir.  No convalescent plasma due to multiple allergies.  -Restart Decadron -Lasix IV 40 mg  Uncontrolled diabetes mellitus type 2, insulin-dependent. Intermittent hypoglycemia Severe diabetic ketoacidosis, resolved -Hemoglobin A1c 14.7.  Reduce dose of Lantus to 20U daily. Steroids stopped.   Acute kidney injury secondary to ATN/urinary retention -Stable for now  Elevated troponin secondary to demand ischemia -Seen by cardiology  Migraine headaches -As needed meds  Chronic sinus tachycardia, longstanding -On clonidine  Did well with speech therapy and PT/OT  Previously diagnosed with OSA but noncompliant with CPAP  PT/OT=  Home heatlh  DVT prophylaxis: Hep SQ Code Status: Full code Family Communication: Spoke with Jeneen Rinks.   Disposition Plan: Maintain hospital stay for least 24 hours due to hypoxia.  We started IV Decadron.    Subjective: Symptomatically feels okay but she still gets hypoxic with minimal ambulation.  On room air she was fluctuating from 87-90% and quickly desaturating to low 80s with minimal movement.  Review of Systems Otherwise negative except as per HPI, including: General = no fevers, chills, dizziness, malaise, fatigue HEENT/EYES = negative for pain, redness, loss of vision, double vision, blurred vision, loss of hearing, sore throat, hoarseness, dysphagia Cardiovascular= negative for chest pain, palpitation, murmurs, lower extremity swelling Respiratory/lungs= negative for shortness of breath, cough, hemoptysis, wheezing, mucus production Gastrointestinal= negative for nausea, vomiting,, abdominal pain, melena, hematemesis Genitourinary= negative for Dysuria, Hematuria, Change in Urinary Frequency MSK = Negative for arthralgia, myalgias, Back Pain, Joint swelling  Neurology= Negative for headache, seizures, numbness, tingling  Psychiatry= Negative for anxiety, depression, suicidal and homocidal ideation Allergy/Immunology= Medication/Food allergy as listed  Skin= Negative for Rash, lesions, ulcers, itching   Objective: Vitals:   10/23/19 1652 10/23/19 1933 10/24/19 0412 10/24/19 0800  BP: (!) 144/85 131/75 140/72 121/60  Pulse: 84 97 96 91  Resp: 19 18 18 20   Temp: 99 F (37.2 C) 98.8 F (37.1 C) 98 F (36.7 C) 98.6 F (37 C)  TempSrc: Oral Oral Oral Oral  SpO2: 95% 92% 92% (!) 89%  Weight:      Height:        Intake/Output Summary (Last 24 hours) at 10/24/2019 1138 Last data filed at 10/23/2019 1300 Gross per 24 hour  Intake 120 ml  Output -  Net 120 ml   Filed Weights   10/16/19 0500 10/16/19 1930 10/17/19 1200  Weight: 100.6 kg 100.6 kg 100 kg    Examination: Constitutional: Not in acute distress Respiratory: Minimal rhonchi bilaterally Cardiovascular:  Normal sinus rhythm, no rubs Abdomen: Nontender nondistended good bowel sounds Musculoskeletal: No edema noted Skin: No rashes seen Neurologic: CN 2-12 grossly intact.  And nonfocal Psychiatric: Normal judgment and insight. Alert and oriented x 3. Normal mood.   Data Reviewed:   CBC: Recent Labs  Lab 10/18/19 0634 10/20/19 0855 10/21/19 0550 10/22/19 0305  WBC 17.2* 7.9 6.5 7.0  NEUTROABS 14.2*  --   --   --   HGB 12.1 11.3* 12.0 11.2*  HCT 43.1 37.4 40.2 37.2  MCV 104.9* 100.3* 99.5 99.5  PLT 469* 452* 496* [id number]*   Basic Metabolic Panel: Recent Labs  Lab 10/18/19 0634 10/19/19 0540 10/20/19 0855 10/21/19 0550 10/22/19 0305  NA 151* 153* 151* 149* 146*  K 4.7 4.3 3.7 3.1* 3.3*  CL 119* 120* 116* 110 108  CO2 22 22 26 29 27   GLUCOSE 125* 145* 142* 111* 59*  BUN 79* 75* 58* 44* 37*  CREATININE 2.37* 2.06* 1.52* 1.39* 1.16*  CALCIUM 8.8* 9.1 8.9 8.7* 8.4*  MG 2.3  --   --   --  1.6*  PHOS 6.6*  --   --   --  3.9   GFR: Estimated Creatinine Clearance: 67.4 mL/min (A) (by C-G formula based on SCr of 1.16 mg/dL (H)). Liver Function Tests: Recent Labs  Lab 10/18/19 0634 10/20/19 0855 10/21/19 0550 10/22/19 0305  AST 19 11* 14*  --   ALT 24 19 20   --   ALKPHOS 98 73 73  --   BILITOT 0.3 0.6 0.3  --   PROT 6.1* 5.8* 6.2*  --   ALBUMIN 2.3* 2.2* 2.4* 2.5*   No results for input(s): LIPASE, AMYLASE in the last 168 hours. No results for input(s): AMMONIA in the last 168 hours. Coagulation Profile: Recent Labs  Lab 10/20/19 0855 10/21/19 0550 10/22/19 0305 10/23/19 0035 10/24/19 0101  INR 1.0 0.9 0.9 1.0 0.9   Cardiac Enzymes: No results for input(s): CKTOTAL, CKMB, CKMBINDEX, TROPONINI in the last 168 hours. BNP (last 3 results) No results for input(s): PROBNP in the last 8760 hours. HbA1C: No results for input(s): HGBA1C in the last 72 hours. CBG: Recent Labs  Lab 10/24/19 0045 10/24/19 0107 10/24/19 0318 10/24/19 0413 10/24/19 0758  GLUCAP 120*  140* 130* 117* 96   Lipid Profile: No results for input(s): CHOL, HDL, LDLCALC, TRIG, CHOLHDL, LDLDIRECT in the last 72 hours. Thyroid Function Tests: No results for input(s): TSH, T4TOTAL, FREET4, T3FREE, THYROIDAB in the last 72 hours. Anemia Panel: No results for input(s): VITAMINB12, FOLATE, FERRITIN, TIBC, IRON, RETICCTPCT in the last 72 hours. Sepsis Labs: No results for input(s): PROCALCITON, LATICACIDVEN in the last 168 hours.  No results found for this or any previous visit (from the past 240 hour(s)).       Radiology Studies: Dg Chest Port 1 View  Result Date: 10/24/2019 CLINICAL DATA:  Dyspnea. EXAM: PORTABLE CHEST 1 VIEW COMPARISON:  October 20, 2019. FINDINGS: Stable cardiomediastinal silhouette. No pneumothorax or pleural effusion is noted. Stable bilateral lung opacities are noted, left greater than right. Bony thorax is unremarkable. IMPRESSION: Stable bilateral lung opacities are noted concerning for multifocal pneumonia, left greater than right. Electronically Signed   By: Marijo Conception M.D.   On: 10/24/2019 08:14        Scheduled Meds: . Chlorhexidine Gluconate Cloth  6 each Topical Q0600  .  dexamethasone (DECADRON) injection  4 mg Intravenous Q24H  . escitalopram  20 mg Per Tube Daily  . feeding supplement (PRO-STAT SUGAR FREE 64)  30 mL Per Tube TID  . feeding supplement (VITAL AF 1.2 CAL)  1,000 mL Per Tube Q24H  . furosemide  40 mg Intravenous Once  . heparin injection (subcutaneous)  5,000 Units Subcutaneous Q8H  . insulin aspart  0-20 Units Subcutaneous Q4H  . insulin glargine  20 Units Subcutaneous Daily  . lamoTRIgine  150 mg Per Tube Daily  . polyethylene glycol  17 g Oral BID  . sennosides  5 mL Per Tube BID  . sodium chloride flush  3 mL Intravenous Q12H  . vitamin C  500 mg Per Tube Daily  . zinc sulfate  220 mg Per Tube Daily   Continuous Infusions: . sodium chloride 10 mL/hr at 10/21/19 1234     LOS: 11 days   Time spent= 35 mins     Jasmyn Picha Arsenio Loader, MD [facility]  If 7PM-7AM, please contact night-coverage  10/24/2019, 11:38 AM

## 2019-10-24 NOTE — Progress Notes (Signed)
  Speech Language Pathology Treatment: Dysphagia  Patient Details Name: Maria Bentley MRN: [id number] DOB: [birth year] Today's Date: 10/24/2019 Time: 1200-1210 SLP Time Calculation (min) (ACUTE ONLY): 10 min  Assessment / Plan / Recommendation Clinical Impression  Pt able to sit herself up on edge of bed to take sips of thin water with one verbal cue to take slow sips. Pt noted to have some congested coughing prior to drinking and audible expectoration. NG tube is now out and over slow careful intake pt had one cough. Pt does potentially have some mild residual dysphagia following prolonged intubation, but with careful precautions pt should tolerate upgrade well. Will f/u tomorrow to check in.May introduce RMT.   HPI HPI: Pt is a 48 yo female admitted with DKA and COVID-19 requiring ETT 11/2-11/16. PMH includes obesity s/p gastric bypass, HTN, HLD, DM, anxiety, depression, endometriosis      SLP Plan  Continue with current plan of care       Recommendations  Diet recommendations: Regular;Thin liquid Liquids provided via: Cup;Straw Medication Administration: Whole meds with puree Supervision: Patient able to self feed Compensations: Slow rate;Small sips/bites Postural Changes and/or Swallow Maneuvers: Seated upright 90 degrees                General recommendations: Rehab consult Oral Care Recommendations: Oral care BID Follow up Recommendations: Inpatient Rehab SLP Visit Diagnosis: Dysphagia, unspecified (R13.10) Plan: Continue with current plan of care       GO               Herbie [place], MA [place] Pager [phone] Office [phone]  Lynann Beaver 10/24/2019, 12:39 PM

## 2019-10-24 NOTE — Progress Notes (Signed)
CBG at 0000 of 65. Patient given orange juice with sugar and crackers with peanut butter. Sugar increased to 120. WCTM

## 2019-10-25 ENCOUNTER — Other Ambulatory Visit: Payer: Self-pay | Admitting: Internal Medicine

## 2019-10-25 LAB — GLUCOSE, CAPILLARY
Glucose-Capillary: 100 mg/dL — ABNORMAL HIGH (ref 70–99)
Glucose-Capillary: 105 mg/dL — ABNORMAL HIGH (ref 70–99)
Glucose-Capillary: 124 mg/dL — ABNORMAL HIGH (ref 70–99)
Glucose-Capillary: 289 mg/dL — ABNORMAL HIGH (ref 70–99)

## 2019-10-25 LAB — COMPREHENSIVE METABOLIC PANEL
ALT: 18 U/L (ref 0–44)
AST: 15 U/L (ref 15–41)
Albumin: 2.5 g/dL — ABNORMAL LOW (ref 3.5–5.0)
Alkaline Phosphatase: 63 U/L (ref 38–126)
Anion gap: 10 (ref 5–15)
BUN: 21 mg/dL — ABNORMAL HIGH (ref 6–20)
CO2: 30 mmol/L (ref 22–32)
Calcium: 8.2 mg/dL — ABNORMAL LOW (ref 8.9–10.3)
Chloride: 104 mmol/L (ref 98–111)
Creatinine, Ser: 1.07 mg/dL — ABNORMAL HIGH (ref 0.44–1.00)
GFR calc Af Amer: >60 mL/min (ref >60)
GFR calc non Af Amer: >60 mL/min (ref >60)
Glucose, Bld: 81 mg/dL (ref 70–99)
Potassium: 3 mmol/L — ABNORMAL LOW (ref 3.5–5.1)
Sodium: 144 mmol/L (ref 135–145)
Total Bilirubin: 0.4 mg/dL (ref 0.3–1.2)
Total Protein: 5.5 g/dL — ABNORMAL LOW (ref 6.5–8.1)

## 2019-10-25 LAB — LACTATE DEHYDROGENASE: LDH: 187 U/L (ref 98–192)

## 2019-10-25 LAB — CBC
HCT: 38.4 % (ref 36.0–46.0)
Hemoglobin: 11.6 g/dL — ABNORMAL LOW (ref 12.0–15.0)
MCH: 30.6 pg (ref 26.0–34.0)
MCHC: 30.2 g/dL (ref 30.0–36.0)
MCV: 101.3 fL — ABNORMAL HIGH (ref 80.0–100.0)
Platelets: 398 10*3/uL (ref 150–400)
RBC: 3.79 MIL/uL — ABNORMAL LOW (ref 3.87–5.11)
RDW: 16 % — ABNORMAL HIGH (ref 11.5–15.5)
WBC: 10.9 10*3/uL — ABNORMAL HIGH (ref 4.0–10.5)
nRBC: 0 % (ref 0.0–0.2)

## 2019-10-25 LAB — D-DIMER, QUANTITATIVE: D-Dimer, Quant: 0.93 ug/mL-FEU — ABNORMAL HIGH (ref 0.00–0.50)

## 2019-10-25 LAB — PROTIME-INR
INR: 0.9 (ref 0.8–1.2)
Prothrombin Time: 12.2 seconds (ref 11.4–15.2)

## 2019-10-25 LAB — MAGNESIUM: Magnesium: 1.5 mg/dL — ABNORMAL LOW (ref 1.7–2.4)

## 2019-10-25 LAB — C-REACTIVE PROTEIN: CRP: <0.8 mg/dL (ref <1.0)

## 2019-10-25 LAB — FIBRINOGEN: Fibrinogen: 496 mg/dL — ABNORMAL HIGH (ref 210–475)

## 2019-10-25 LAB — FERRITIN: Ferritin: 35 ng/mL (ref 11–307)

## 2019-10-25 MED ORDER — MAGNESIUM OXIDE 400 (241.3 MG) MG PO TABS
800.0000 mg | ORAL_TABLET | Freq: Once | ORAL | Status: AC
  Administered 2019-10-25: 800 mg via ORAL
  Filled 2019-10-25: qty 2

## 2019-10-25 MED ORDER — INSULIN GLARGINE 100 UNIT/ML ~~LOC~~ SOLN: 20.0000 [IU] | Freq: Every day | SUBCUTANEOUS | 0 refills | Status: DC

## 2019-10-25 MED ORDER — LOPERAMIDE HCL 2 MG PO CAPS: 4.0000 mg | ORAL_CAPSULE | ORAL | 0 refills | Status: DC | PRN

## 2019-10-25 MED ORDER — PREDNISONE 10 MG PO TABS: 50.0000 mg | ORAL_TABLET | Freq: Every day | ORAL | 0 refills | Status: AC

## 2019-10-25 MED ORDER — POTASSIUM CHLORIDE CRYS ER 20 MEQ PO TBCR
40.0000 meq | EXTENDED_RELEASE_TABLET | Freq: Once | ORAL | Status: AC
  Administered 2019-10-25: 40 meq via ORAL
  Filled 2019-10-25: qty 2

## 2019-10-25 NOTE — Progress Notes (Signed)
  Speech Language Pathology Treatment: Dysphagia  Patient Details Name: Maria Bentley MRN: [id number] DOB: Jul 12, 1971 Today's Date: 10/25/2019 Time: [id number] SLP Time Calculation (min) (ACUTE ONLY): 25 min  Assessment / Plan / Recommendation Clinical Impression  Pt seen with am meal, sitting edge of bed. Preparing meal and self feeding appropriately. However there are persistent incident of immediate coughing, watery eyes. Pt agrees to having ongoing trouble swallowing. She says she feels like she had a lot of trouble swallowing prior to admission. Her vocal quality is clear and her cough is strong. Really coughing may be more of a discomfort than a safety concern, but instrumental assessment is needed to determine etiology and severity. For now, trialed a chin tuck and pt found this very successful and reported it gave a feeling of relief. She demonstrated use throughout her meal without cueing. Recommend SLP f/u for efficacy of strategy and need for MBS prior to d/c.   HPI HPI: Pt is a 48 yo female admitted with DKA and COVID-19 requiring ETT 11/2-11/16. PMH includes obesity s/p gastric bypass, HTN, HLD, DM, anxiety, depression, endometriosis      SLP Plan  Continue with current plan of care       Recommendations  Diet recommendations: Regular;Thin liquid Liquids provided via: Cup;Straw Medication Administration: Whole meds with puree Supervision: Patient able to self feed Compensations: Slow rate;Small sips/bites Postural Changes and/or Swallow Maneuvers: Seated upright 90 degrees                General recommendations: Rehab consult Oral Care Recommendations: Oral care BID Follow up Recommendations: Inpatient Rehab SLP Visit Diagnosis: Dysphagia, unspecified (R13.10) Plan: Continue with current plan of care       GO               Herbie [place], MA [place] Pager [phone] Office [phone]  Lynann Beaver 10/25/2019, 11:35 AM

## 2019-10-25 NOTE — Discharge Summary (Signed)
Physician Discharge Summary  Maria Bentley OXB:[id number] DOB: [birth year] DOA: 10/13/2019  PCP: [name], MD  Admit date: 10/13/2019 Discharge date: 10/25/2019  Admitted From: Home  Disposition:  Home   Recommendations for Outpatient Follow-up:  1. Follow up with PCP in 1-2 weeks 2. Please obtain BMP/CBC in one week your next doctors visit.  3. Lantus 20U daily    Discharge Condition: Stable CODE STATUS: Full  Diet recommendation:  Diabetic   Brief/Interim Summary: 48 year old with poorly controlled diabetes mellitus, recurrent rectal abscess, morbid obesity status post gastric bypass with vitamin D deficiency, obstructive sleep apnea depression and anxiety admitted for [facility] as she was found obtunded.  Husband and her both had COVID-19 and was nearly bedridden prior to hospitalization.  Upon admission she was found to be obtunded and diabetic ketoacidosis.  She was intubated on 11/2, extubated 11/6.  Completed remdesivir from 11/2-11/6.  Slowly her oxygen levels improved.  Periodically her Decadron had to be restarted due to some hypoxia but that resolved as well.  Her Lantus dose was significantly reduced due to intermittent hypoglycemia.  I explained her to be very diligent about checking her blood glucose at home especially there will be some fluctuations in her blood glucose and notify primary care doctor provider as needed. Spoke with her husband and notified him as well.  All the questions answered.  Stable for discharge.   Discharge Diagnoses:  Active Problems:   OSA (obstructive sleep apnea)   Hyperlipidemia   Diabetes mellitus type 2, uncontrolled ([place])   Noncompliance with diabetes treatment   Hypertension   COVID-19 virus infection   History of gastric bypass   DKA, type 2 (HCC)   Acute respiratory failure with hypoxia ([place])   Acute respiratory disease due to COVID-19 virus   Pressure injury of skin  Acute respiratory failure secondary to COVID-19  pneumonia redeveloped hypoxia again -She is no longer hypoxic.  Completed course of remdesivir.  Decadron has been stopped.  Saturating greater than 90% on room air.  Intermittently she becomes hypoxic down to the 85-88% when sleeping by the secondary to her obstructive sleep apnea.  Quick recovery upon waking up. Inflammatory markers have trended down.  Uncontrolled diabetes mellitus type 2, insulin-dependent. Intermittent hypoglycemia Severe diabetic ketoacidosis, resolved -Hemoglobin A1c 14.7.    Due to hypoglycemia in the hospital her Lantus dose is reduced to 20 units daily.  Advised to continue monitoring this closely at home and keeping a log of this so her outpatient cardiologist can make adjustments as necessary.  Acute kidney injury secondary to ATN/urinary retention -Stable for now  Elevated troponin secondary to demand ischemia -Seen by cardiology  Migraine headaches -As needed meds  Chronic sinus tachycardia, longstanding -On clonidine  Did well with speech therapy and PT/OT  Previously diagnosed with OSA but noncompliant with CPAP   Subjective: Doing well no complaints.  Intermittently drops her saturation down to 8788% upon napping but quick recovery upon waking up.  Denies any shortness of breath or other complaints.  Discharge Exam: Vitals:   10/25/19 0754 10/25/19 1110  BP: 125/77   Pulse: 98   Resp: 18   Temp: 98.2 F (36.8 C)   SpO2: 91% 93%   Vitals:   10/25/19 0000 10/25/19 0405 10/25/19 0754 10/25/19 1110  BP: 124/71 139/83 125/77   Pulse: 80 78 98   Resp: 20  18   Temp: 98 F (36.7 C) 98.4 F (36.9 C) 98.2 F (36.8 C)   TempSrc: Oral  Oral Oral   SpO2: 91% 91% 91% 93%  Weight:      Height:        General: Pt is alert, awake, not in acute distress Cardiovascular: RRR, S1/S2 +, no rubs, no gallops Respiratory: CTA bilaterally, no wheezing, no rhonchi Abdominal: Soft, NT, ND, bowel sounds + Extremities: no edema, no  cyanosis  Discharge Instructions  Discharge Instructions    Discharge patient   Complete by: As directed    Discharge disposition: 01-Home or Self Care   Discharge patient date: 10/25/2019     Allergies as of 10/25/2019      Reactions   Amoxicillin Hives, Rash   Avocado Swelling   Clarithromycin Hives, Rash   Honey Anaphylaxis   Other Hives, Swelling   All melon   Sulfa Drugs Cross Reactors Anaphylaxis   Watermelon Concentrate Hives, Swelling   Oral swelling, ears swell shut   Metoprolol Other (See Comments)   Hypotension   Adhesive [tape] Itching   Skin breakdown   Levemir [insulin Detemir] Rash   Red/swollen injection site reactions with residual lumps   Victoza [liraglutide] Nausea Only      Medication List    STOP taking these medications   Basaglar KwikPen 100 UNIT/ML Sopn Replaced by: insulin glargine 100 UNIT/ML injection   HYDROcodone-acetaminophen 10-325 MG tablet Commonly known as: NORCO     TAKE these medications   acetaminophen 325 MG tablet Commonly known as: TYLENOL Take 650 mg by mouth every 6 (six) hours as needed for mild pain.   albuterol 108 (90 Base) MCG/ACT inhaler Commonly known as: VENTOLIN HFA Inhale 2 puffs into the lungs every 6 (six) hours as needed for wheezing or shortness of breath.   aspirin-acetaminophen-caffeine 250-250-65 MG tablet Commonly known as: EXCEDRIN MIGRAINE Take 2 tablets by mouth every 6 (six) hours as needed for headache.   atorvastatin 20 MG tablet Commonly known as: LIPITOR TAKE 1 TABLET BY MOUTH EVERY DAY What changed: when to take this   benzonatate 100 MG capsule Commonly known as: TESSALON Take 1-2 capsules (100-200 mg total) by mouth 3 (three) times daily as needed for cough.   blood glucose meter kit and supplies Kit Dispense based on patient and insurance preference. Use up to four times daily as directed. (FOR ICD-E11.21)   BOTOX IJ Inject as directed every 3 (three) months. For migraine    buPROPion 150 MG 24 hr tablet Commonly known as: WELLBUTRIN XL Take 450 mg by mouth daily.   cyanocobalamin 1000 MCG/ML injection Commonly known as: (VITAMIN B-12) INJECT 1 ML (1,000 MCG TOTAL) INTO THE MUSCLE ONCE A WEEK. FOR 4 WEEKS, THEN MONTHLY THEREAFTER   cyclobenzaprine 10 MG tablet Commonly known as: FLEXERIL Take 5-10 mg by mouth 3 (three) times daily as needed for muscle spasms.   diltiazem 180 MG 24 hr capsule Commonly known as: CARDIZEM CD TAKE 1 CAPSULE BY MOUTH EVERY DAY What changed:   how much to take  how to take this  when to take this   EPINEPHrine 0.3 mg/0.3 mL Soaj injection Commonly known as: EPI-PEN Inject 0.3 mg into the muscle as needed for anaphylaxis.   escitalopram 20 MG tablet Commonly known as: LEXAPRO Take 20 mg by mouth daily.   fluticasone 50 MCG/ACT nasal spray Commonly known as: FLONASE SPRAY 2 SPRAYS INTO EACH NOSTRIL EVERY DAY What changed: See the new instructions.   FreeStyle Libre 14 Day Reader Kerrin Mo 1 applicator by Does not apply route 4 (four) times daily.  FreeStyle Libre 14 Day Sensor Misc Inject 1 application into the skin 4 (four) times daily as needed (uncontrolled diabetes).   insulin aspart 100 UNIT/ML FlexPen Commonly known as: NovoLOG FlexPen Inject 15 units + sliding scale under the skin with meals. Add 3-5 units for every blood sugar 50 mg/dL over 150. Max dose of 30 units with meals.   insulin glargine 100 UNIT/ML injection Commonly known as: LANTUS Inject 0.2 mLs (20 Units total) into the skin daily. Start taking on: October 26, 2019 Replaces: Basaglar KwikPen 100 UNIT/ML Sopn   Insulin Pen Needle 32G X 4 MM Misc Commonly known as: BD Pen Needle Nano U/F Please specify directions, refills and quantity   Insulin Syringe-Needle U-100 31G X 5/16" 1 ML Misc Commonly known as: B-D INS SYR ULTRAFINE 1CC/31G 1 Syringe by Does not apply route daily.   B-D INS SYR MICROFINE 1CC/28G 28G X 1/2" 1 ML  Misc Generic drug: INS SYRINGE/NEEDLE 1CC/28G USE 1 SYRINGE DAILY AFTER SUPPER   INS SYRINGE/NEEDLE 1CC/28G 28G X 1/2" 1 ML Misc Commonly known as: B-D INS SYR MICROFINE 1CC/28G USE 1 SYRINGE DAILY AFTER SUPPER   lamoTRIgine 150 MG tablet Commonly known as: LAMICTAL Take 150 mg by mouth daily.   Lancet Device Misc Test blood sugar two times daily   loperamide 2 MG capsule Commonly known as: IMODIUM Take 2 capsules (4 mg total) by mouth as needed for diarrhea or loose stools.   LORazepam 0.5 MG tablet Commonly known as: ATIVAN Take 0.5-1 mg by mouth daily as needed.   meclizine 25 MG tablet Commonly known as: ANTIVERT Take 1 tablet (25 mg total) by mouth 3 (three) times daily as needed for dizziness.   meloxicam 15 MG tablet Commonly known as: MOBIC Take 15 mg by mouth daily.   metFORMIN 500 MG 24 hr tablet Commonly known as: GLUCOPHAGE-XR TAKE 4 TABLETS BY MOUTH EVERY DAY WITH BREAKFAST What changed: See the new instructions.   Nurtec 75 MG Tbdp Generic drug: Rimegepant Sulfate Take 1 tablet by mouth daily as needed.   nystatin powder Commonly known as: MYCOSTATIN/NYSTOP APPLY 1 APPLICATION TO AFFECTED AREA TWICE A DAY What changed: See the new instructions.   OneTouch Verio test strip Generic drug: glucose blood TEST BLOOD SUGAR 5 TIMES DAILY   oxyCODONE-acetaminophen 10-325 MG tablet Commonly known as: PERCOCET Take 1 tablet by mouth every 6 (six) hours as needed for pain.   predniSONE 10 MG tablet Commonly known as: DELTASONE Take 5 tablets (50 mg total) by mouth daily for 3 days.   pregabalin 50 MG capsule Commonly known as: LYRICA Take 50-300 mg by mouth See admin instructions. Taking 1 (59m) capsule during the day if needed and 6 capsules (3059m at bedtime   SUMAtriptan 100 MG tablet Commonly known as: IMITREX Take 100 mg by mouth every 2 (two) hours as needed for migraine. May repeat in 2 hours if headache persists or recurs.   SYRINGE-NEEDLE  (DISP) 3 ML 25G X 1" 3 ML Misc Commonly known as: BD Integra Syringe FOR USE WITH B12 INJECTIONS WEEKLY/MONTHLY   Vitamin D (Ergocalciferol) 1.25 MG (50000 UT) Caps capsule Commonly known as: DRISDOL TAKE 1 CAPSULE BY MOUTH ONCE A WEEK What changed: when to take this      Follow-up Information    TuCrecencio McMD. Schedule an appointment as soon as possible for a visit in 1 week(s).   Specialty: Internal Medicine Contact information: [phone]  Wellington Hampshire, MD .   Specialty: Cardiology Contact information: [address] [place] [address] [phone]          Allergies  Allergen Reactions  . Amoxicillin Hives and Rash  . Avocado Swelling  . Clarithromycin Hives and Rash  . Honey Anaphylaxis  . Other Hives and Swelling    All melon  . Sulfa Drugs Cross Reactors Anaphylaxis  . Watermelon Concentrate Hives and Swelling    Oral swelling, ears swell shut  . Metoprolol Other (See Comments)    Hypotension  . Adhesive [Tape] Itching    Skin breakdown  . Levemir [Insulin Detemir] Rash    Red/swollen injection site reactions with residual lumps  . Victoza [Liraglutide] Nausea Only    You were cared for by a hospitalist during your hospital stay. If you have any questions about your discharge medications or the care you received while you were in the hospital after you are discharged, you can call the unit and asked to speak with the hospitalist on call if the hospitalist that took care of you is not available. Once you are discharged, your primary care physician will handle any further medical issues. Please note that no refills for any discharge medications will be authorized once you are discharged, as it is imperative that you return to your primary care physician (or establish a relationship with a primary care physician if you do not have one) for your aftercare needs so that they  can reassess your need for medications and monitor your lab values.   Procedures/Studies: Dg Abd 1 View  Result Date: 10/13/2019 CLINICAL DATA:  OG tube placement EXAM: ABDOMEN - 1 VIEW COMPARISON:  10/13/2019 FINDINGS: OG tube tip is in the upper abdomen, likely in the mid stomach. The side port is in the distal esophagus. IMPRESSION: NG tube tip likely in the mid stomach with the side port in the distal esophagus. Electronically Signed   By: Rolm Baptise M.D.   On: 10/13/2019 21:22   Dg Abd 1 View  Result Date: 10/13/2019 CLINICAL DATA:  Intubation, central line, nasogastric tube placement, COVID-19 EXAM: ABDOMEN - 1 VIEW COMPARISON:  Portable exam 1725 hours compared to 05/08/2011 FINDINGS: Patient rotated to RIGHT. Tip of nasogastric tube projects over distal gastric antrum. Mild elevation of RIGHT diaphragm. Paucity of bowel gas. IMPRESSION: Tip of nasogastric tube projects over distal gastric antrum. Electronically Signed   By: Lavonia Dana M.D.   On: 10/13/2019 18:01   Ct Head Wo Contrast  Result Date: 10/13/2019 CLINICAL DATA:  Altered mental status and leukocytosis EXAM: CT HEAD WITHOUT CONTRAST TECHNIQUE: Contiguous axial images were obtained from the base of the skull through the vertex without intravenous contrast. COMPARISON:  November 22, 2009 FINDINGS: Brain: Ventricles are normal in size and configuration. There is no intracranial mass, hemorrhage, extra-axial fluid collection, or midline shift. The brain parenchyma appears unremarkable. No acute infarct is evident. Vascular: There is no hyperdense vessel. No evident vascular calcifications. Skull: The bony calvarium appears intact. Sinuses/Orbits: Paranasal sinuses are clear. Orbits appear symmetric bilaterally. Other: Mastoid air cells are clear. IMPRESSION: Study within normal limits.3 Electronically Signed   By: Lowella Grip III M.D.   On: 10/13/2019 14:40   Dg Chest Port 1 View  Result Date: 10/24/2019 CLINICAL DATA:   Dyspnea. EXAM: PORTABLE CHEST 1 VIEW COMPARISON:  October 20, 2019. FINDINGS: Stable cardiomediastinal silhouette. No pneumothorax or pleural effusion is noted. Stable bilateral lung opacities are noted, left greater  than right. Bony thorax is unremarkable. IMPRESSION: Stable bilateral lung opacities are noted concerning for multifocal pneumonia, left greater than right. Electronically Signed   By: Marijo Conception M.D.   On: 10/24/2019 08:14   Dg Chest Port 1 View  Result Date: 10/20/2019 CLINICAL DATA:  48 year old female with history of COVID-19 infection. EXAM: PORTABLE CHEST 1 VIEW COMPARISON:  Chest x-ray 10/17/2019. FINDINGS: Patient has been extubated. Enteric tube again noted intra in the stomach, but the tip of the tube is below the lower margin of the image. Lung volumes are low. Patchy multifocal asymmetrically distributed interstitial and airspace opacities are noted throughout the lungs bilaterally, most confluent throughout the left mid lung. Overall, aeration has slightly improved compared to the prior examination. Possible small left pleural effusion. No right pleural effusion. No pneumothorax. No evidence of pulmonary edema. Heart size appears upper limits of normal. Upper mediastinal contours are distorted by patient positioning. IMPRESSION: 1. Support apparatus, as above. 2. Improving aeration with persistent multilobar bilateral pneumonia, as above. Electronically Signed   By: Vinnie Langton M.D.   On: 10/20/2019 07:48   Dg Chest Port 1 View  Result Date: 10/17/2019 CLINICAL DATA:  Order for pneumonia due to covid EXAM: PORTABLE CHEST 1 VIEW COMPARISON:  Radiograph 10/14/2019 FINDINGS: Tracheal tube 1.7 cm from carina. Feeding tube extends the stomach. Central venous line tip in distal SVC. Stable cardiac silhouette. Low lung volumes. There is patchy bilateral airspace disease slightly increased in density. There is now subtle obscuration of the LEFT heart border. No pneumothorax. No  pleural fluid. IMPRESSION: Increasing bilateral patchy airspace disease. Stable support apparatus Low lung volumes Electronically Signed   By: Suzy Bouchard M.D.   On: 10/17/2019 08:36   Dg Chest Port 1 View  Result Date: 10/14/2019 CLINICAL DATA:  Acute respiratory failure with hypoxemia. COVID-19. EXAM: PORTABLE CHEST 1 VIEW COMPARISON:  10/13/2019 and 10/05/2019 FINDINGS: Endotracheal tube in good position 3 cm above the carina. NG tube tip in the stomach. Left central catheter tip in the superior vena cava above the cavoatrial junction in good position, unchanged. The hazy bilateral pulmonary infiltrates have slightly progressed. Heart size and vascularity are normal. No effusions. IMPRESSION: 1. Slight progression of the hazy bilateral pulmonary infiltrates. 2. Endotracheal tube in good position. Electronically Signed   By: Lorriane Shire M.D.   On: 10/14/2019 11:15   Dg Chest Port 1 View  Result Date: 10/13/2019 CLINICAL DATA:  Central line placement, NG tube placement EXAM: PORTABLE CHEST 1 VIEW COMPARISON:  10/13/2019 FINDINGS: Endotracheal tube with the tip 3.3 cm above the carina. Nasogastric tube coursing into the right upper quadrant. Left subclavian central venous catheter with the tip projecting over the SVC. Bilateral diffuse interstitial thickening. No pleural effusion or pneumothorax. Stable cardiomediastinal silhouette. No aggressive osseous lesion. IMPRESSION: 1. Endotracheal tube with the tip 3.3 cm above the carina. 2. Nasogastric tube coursing into the right upper quadrant. This is an atypical course for the nasogastric tube. Recommend further evaluation with a KUB. 3. Left subclavian central venous catheter with the tip projecting over the SVC. 4. Diffuse bilateral interstitial thickening unchanged compared with x-ray performed earlier same day. Electronically Signed   By: Kathreen Devoid   On: 10/13/2019 17:57   Dg Chest Port 1 View  Result Date: 10/13/2019 CLINICAL DATA:   Dyspnea. Additional history provided: Patient presents via EMS from home for altered mental status. EXAM: PORTABLE CHEST 1 VIEW COMPARISON:  Chest radiograph 10/05/2019 FINDINGS: Limited assessment due to  patient rotation and shallow inspiration. Cardiomediastinal silhouette appears unchanged with heart size normal. Unchanged elevation of the right hemidiaphragm. Bilateral ill-defined opacities may reflect edema and/or infection. No evidence of pleural effusion or pneumothorax. No acute bony abnormality. IMPRESSION: Limited, shallow inspiration radiograph. Ill-defined opacities within the bilateral lungs may reflect edema and/or infection. Electronically Signed   By: Kellie Simmering DO   On: 10/13/2019 09:55   Dg Chest Port 1 View  Result Date: 10/05/2019 CLINICAL DATA:  Shortness of breath.  COVID-19 positive EXAM: PORTABLE CHEST 1 VIEW COMPARISON:  May 08, 2011 FINDINGS: No edema or consolidation. Heart size and pulmonary vascularity are normal. No adenopathy. No bone lesions. IMPRESSION: No edema or consolidation.  No evident adenopathy. Electronically Signed   By: Lowella Grip III M.D.   On: 10/05/2019 14:14   Dg Abd Portable 1v  Result Date: 10/15/2019 CLINICAL DATA:  NG tube placed EXAM: PORTABLE ABDOMEN - 1 VIEW COMPARISON:  None. FINDINGS: Enteric tube passes below the diaphragm and is likely within the jejunum. Bowel gas pattern is unremarkable on this single view. IMPRESSION: Enteric tube likely within the jejunum. Electronically Signed   By: Macy Mis M.D.   On: 10/15/2019 17:10      The results of significant diagnostics from this hospitalization (including imaging, microbiology, ancillary and laboratory) are listed below for reference.     Microbiology: No results found for this or any previous visit (from the past 240 hour(s)).   Labs: BNP (last 3 results) No results for input(s): BNP in the last 8760 hours. Basic Metabolic Panel: Recent Labs  Lab 10/19/19 0540  10/20/19 0855 10/21/19 0550 10/22/19 0305 10/25/19 0116  NA 153* 151* 149* 146* 144  K 4.3 3.7 3.1* 3.3* 3.0*  CL 120* 116* 110 108 104  CO2 22 26 29 27 30   GLUCOSE 145* 142* 111* 59* 81  BUN 75* 58* 44* 37* 21*  CREATININE 2.06* 1.52* 1.39* 1.16* 1.07*  CALCIUM 9.1 8.9 8.7* 8.4* 8.2*  MG  --   --   --  1.6* 1.5*  PHOS  --   --   --  3.9  --    Liver Function Tests: Recent Labs  Lab 10/20/19 0855 10/21/19 0550 10/22/19 0305 10/25/19 0116  AST 11* 14*  --  15  ALT 19 20  --  18  ALKPHOS 73 73  --  63  BILITOT 0.6 0.3  --  0.4  PROT 5.8* 6.2*  --  5.5*  ALBUMIN 2.2* 2.4* 2.5* 2.5*   No results for input(s): LIPASE, AMYLASE in the last 168 hours. No results for input(s): AMMONIA in the last 168 hours. CBC: Recent Labs  Lab 10/20/19 0855 10/21/19 0550 10/22/19 0305 10/25/19 0116  WBC 7.9 6.5 7.0 10.9*  HGB 11.3* 12.0 11.2* 11.6*  HCT 37.4 40.2 37.2 38.4  MCV 100.3* 99.5 99.5 101.3*  PLT 452* 496* 477* 398   Cardiac Enzymes: No results for input(s): CKTOTAL, CKMB, CKMBINDEX, TROPONINI in the last 168 hours. BNP: Invalid input(s): POCBNP CBG: Recent Labs  Lab 10/24/19 2051 10/24/19 2303 10/25/19 0001 10/25/19 0412 10/25/19 0809  GLUCAP 146* 130* 124* 100* 105*   D-Dimer Recent Labs    10/25/19 0116  DDIMER 0.93*   Hgb A1c No results for input(s): HGBA1C in the last 72 hours. Lipid Profile No results for input(s): CHOL, HDL, LDLCALC, TRIG, CHOLHDL, LDLDIRECT in the last 72 hours. Thyroid function studies No results for input(s): TSH, T4TOTAL, T3FREE, THYROIDAB in the last 72  hours.  Invalid input(s): FREET3 Anemia work up Recent Labs    10/25/19 0116  FERRITIN 35   Urinalysis    Component Value Date/Time   COLORURINE YELLOW (A) 10/13/2019 0904   APPEARANCEUR HAZY (A) 10/13/2019 0904   LABSPEC 1.021 10/13/2019 0904   PHURINE 6.0 10/13/2019 0904   GLUCOSEU >=500 (A) 10/13/2019 0904   GLUCOSEU >=1000 (A) 12/27/2015 1603   HGBUR MODERATE  (A) 10/13/2019 0904   BILIRUBINUR NEGATIVE 10/13/2019 0904   BILIRUBINUR neg 12/27/2015 1602   KETONESUR 80 (A) 10/13/2019 0904   PROTEINUR 100 (A) 10/13/2019 0904   UROBILINOGEN 0.2 12/27/2015 1603   NITRITE NEGATIVE 10/13/2019 0904   LEUKOCYTESUR NEGATIVE 10/13/2019 0904   Sepsis Labs Invalid input(s): PROCALCITONIN,  WBC,  LACTICIDVEN Microbiology No results found for this or any previous visit (from the past 240 hour(s)).   Time coordinating discharge:  I have spent 35 minutes face to face with the patient and on the ward discussing the patients care, assessment, plan and disposition with other care givers. >50% of the time was devoted counseling the patient about the risks and benefits of treatment/Discharge disposition and coordinating care.   SIGNED:   Damita Lack, MD  [facility] 10/25/2019, 11:33 AM   If 7PM-7AM, please contact night-coverage

## 2019-10-26 ENCOUNTER — Other Ambulatory Visit: Payer: Self-pay | Admitting: Internal Medicine

## 2019-10-27 ENCOUNTER — Telehealth: Payer: Self-pay

## 2019-10-27 ENCOUNTER — Telehealth: Payer: Self-pay | Admitting: Internal Medicine

## 2019-10-27 NOTE — Telephone Encounter (Signed)
Copied from [place] [id number]. Topic: General - Other >> Oct 27, 2019  1:57 PM Keene Breath wrote: Reason for CRM: Verbal orders to move start date for PT to tomorrow afternoon, per the patient.  For questions, please call [phone]

## 2019-10-27 NOTE — Telephone Encounter (Signed)
Cardiac Clearance received at this time- low rick from cardiac standpoint. Other health comorbidities and recent covid makes her not appropriate for elective surgeries at the present time. Should be evaluated by PCP to get medical Clearance before proceeding.   Medical Clearance faxed to Dr.Teresa Tullo at this time.

## 2019-10-28 ENCOUNTER — Telehealth: Payer: Self-pay

## 2019-10-28 ENCOUNTER — Telehealth: Payer: Self-pay | Admitting: Internal Medicine

## 2019-10-28 DIAGNOSIS — U071 COVID-19: Secondary | ICD-10-CM

## 2019-10-28 NOTE — Telephone Encounter (Signed)
That is  up to the home health agency's policies. My advice is this:   If they do not have PPE gear to wear when they make a housecall, then they should not go in for 14 days from date of positive test.

## 2019-10-28 NOTE — Telephone Encounter (Signed)
Spoke with Angie at [facility] to let her know that we have not been able to clear pt for surgery because she tested positive for Covid-19 and was just discharged from [facility] on Saturday. Also let Angie know that we would not be able to let pt in the office for 21 days from the date she tested positive but that we could do a virtual visit with her. I let her know that I tried reaching out to pt this morning to schedule a virtual visit with her but I got no answer and the mailbox was full, I did send pt a mychart message letting her know we needed to schedule a virtual visit. Angie stated that she would let the CMA and doctor know about pt and also go ahead ans cancel the appts that are scheduled for tomorrow. She is going to fax over another medical clearance form for when we are able to clear her for surgery.

## 2019-10-28 NOTE — Telephone Encounter (Signed)
Patient just released due to covid should home health go into home yet?

## 2019-10-28 NOTE — Telephone Encounter (Signed)
Maria Bentley from [facility] requesting PT orders  2x  3weeks 1x 3 weeks   Also is requesting a rollator walker prescription  Call back [phone]

## 2019-10-29 ENCOUNTER — Ambulatory Visit: Admission: RE | Admit: 2019-10-29 | Payer: BC Managed Care – PPO | Source: Home / Self Care | Admitting: General Surgery

## 2019-10-29 ENCOUNTER — Encounter: Admission: RE | Payer: Self-pay | Source: Home / Self Care

## 2019-10-29 DIAGNOSIS — M47812 Spondylosis without myelopathy or radiculopathy, cervical region: Secondary | ICD-10-CM | POA: Insufficient documentation

## 2019-10-29 SURGERY — EXAM UNDER ANESTHESIA WITH FISTULECTOMY
Anesthesia: General

## 2019-10-29 NOTE — Telephone Encounter (Signed)
Printed Order for [employer] given to CMA for signature and to fax to [place].

## 2019-10-30 NOTE — Telephone Encounter (Signed)
DME has been signed and faxed to [employer].

## 2019-10-31 ENCOUNTER — Telehealth: Payer: Self-pay | Admitting: Internal Medicine

## 2019-10-31 NOTE — Telephone Encounter (Signed)
Copied from [place] [phone]. Topic: General - Other >> Oct 31, 2019  4:08 PM Keene Breath wrote: Reason for CRM: Called to inform the doctor of the patient's missed visit for today.  Patient said she had a doctor appt. And would like to continue next week.  Please advise and call to discuss at [phone]

## 2019-11-03 ENCOUNTER — Encounter: Payer: Self-pay | Admitting: Internal Medicine

## 2019-11-03 ENCOUNTER — Other Ambulatory Visit: Payer: Self-pay

## 2019-11-03 ENCOUNTER — Ambulatory Visit (INDEPENDENT_AMBULATORY_CARE_PROVIDER_SITE_OTHER): Payer: BC Managed Care – PPO | Admitting: Internal Medicine

## 2019-11-03 VITALS — Ht 64.0 in | Wt 206.0 lb

## 2019-11-03 DIAGNOSIS — N170 Acute kidney failure with tubular necrosis: Secondary | ICD-10-CM | POA: Diagnosis not present

## 2019-11-03 DIAGNOSIS — E11649 Type 2 diabetes mellitus with hypoglycemia without coma: Secondary | ICD-10-CM

## 2019-11-03 DIAGNOSIS — E1111 Type 2 diabetes mellitus with ketoacidosis with coma: Secondary | ICD-10-CM

## 2019-11-03 DIAGNOSIS — Z09 Encounter for follow-up examination after completed treatment for conditions other than malignant neoplasm: Secondary | ICD-10-CM | POA: Diagnosis not present

## 2019-11-03 DIAGNOSIS — U071 COVID-19: Secondary | ICD-10-CM

## 2019-11-03 MED ORDER — PREDNISONE 10 MG PO TABS: ORAL_TABLET | ORAL | 0 refills | Status: DC

## 2019-11-03 NOTE — Telephone Encounter (Signed)
FYI: pt missed PT visit due to another doctor's appt.

## 2019-11-03 NOTE — Progress Notes (Addendum)
Virtual Visit via [email or web address]  This visit type was conducted due to national recommendations for restrictions regarding the COVID-19 pandemic (e.g. social distancing).  This format is felt to be most appropriate for this patient at this time.  All issues noted in this document were discussed and addressed.  No physical exam was performed (except for noted visual exam findings with Video Visits).   I connected with@ on 11/03/19 at  4:00 PM EST by a video enabled telemedicine application  and verified that I am speaking with the correct person using two identifiers. Location patient: home Location provider: work or home office Persons participating in the virtual visit: patient, provider  I discussed the limitations, risks, security and privacy concerns of performing an evaluation and management service by telephone and the availability of in person appointments. I also discussed with the patient that there may be a patient responsible charge related to this service. The patient expressed understanding and agreed to proceed. Reason for visit: hospital follow up  HPI:  48 yr old female hospitalized on Nov 2 with obtundation and respiratory failure secondary to  COVID 19 INFECTION that was diagnosed on Oct 23rd. Husband also became infected. Maria Bentley She was intubated in the ER and extubated on Nov 6 .  Treated for DKA as well with IV insulin drip.  Given remdesivir x 5 days and decadron,  weaned from oxygen and discharged home  on Nov 14 with room air sats above 90% except when sleeping (due to untreated OSA).  She feels generally weak and  fatigued and gets short of breath with activities but not at rest.  Appetite is poor due to lack of sense of smell or taste.  Has a mild cough and rhinitis.  No fevers, nausea or diarrhea.   Uncontrolled diabetes : discharged on SSI used tid qac  Starting with a baseline dose of  15 units,  Along with once daily Lantus at 20 units. Has bottomed out repeatedly to 29 usually   around midnight  Still having some dysphagia since extubation. Not choking .  Not using thickened liquids    ROS: See pertinent positives and negatives per HPI.  Past Medical History:  Diagnosis Date  . Anemia   . Anxiety   . Depression 2010  . Diabetes mellitus   . Endometriosis   . Headache, common migraine, intractable   . History of ovarian cyst    left sided, caused by her endometriosis (possibly an endometrioma), in her 54s.  Maria Bentley Hyperlipidemia   . Hypertension   . Kidney stones    left  . Morbid obesity ([place])    s/p gastric bypass  . Personal hx of gastric bypass 2010  . Rosacea   . Vertigo     Past Surgical History:  Procedure Laterality Date  . DILATION AND CURETTAGE OF UTERUS  2011  . gastric bypass    . OVARIAN CYST REMOVAL    . ROTATOR CUFF REPAIR     right    Family History  Problem Relation Age of Onset  . Diabetes Mother   . Hypertension Mother   . Hypertension Father   . Diabetes Father   . Depression Other        strong hx of  . Breast cancer Paternal Grandmother 13  . Cancer Neg Hx     SOCIAL HX:  reports that she quit smoking about 23 years ago. Her smoking use included cigarettes. She has a 4.00 pack-year smoking history. She has never used  smokeless tobacco. She reports that she does not drink alcohol or use drugs.   Current Outpatient Medications:  .  acetaminophen (TYLENOL) 325 MG tablet, Take 650 mg by mouth every 6 (six) hours as needed for mild pain. , Disp: , Rfl:  .  albuterol (VENTOLIN HFA) 108 (90 Base) MCG/ACT inhaler, Inhale 2 puffs into the lungs every 6 (six) hours as needed for wheezing or shortness of breath., Disp: 18 g, Rfl: 0 .  aspirin-acetaminophen-caffeine (EXCEDRIN MIGRAINE) 250-250-65 MG tablet, Take 2 tablets by mouth every 6 (six) hours as needed for headache., Disp: , Rfl:  .  atorvastatin (LIPITOR) 20 MG tablet, TAKE 1 TABLET BY MOUTH EVERY DAY (Patient taking differently: Take 20 mg by mouth daily at 6 PM. ), Disp:  90 tablet, Rfl: 0 .  B-D INS SYR MICROFINE 1CC/28G 28G X 1/2" 1 ML MISC, USE 1 SYRINGE DAILY AFTER SUPPER, Disp: 100 each, Rfl: 1 .  benzonatate (TESSALON) 100 MG capsule, Take 1-2 capsules (100-200 mg total) by mouth 3 (three) times daily as needed for cough., Disp: 40 capsule, Rfl: 0 .  blood glucose meter kit and supplies KIT, Dispense based on patient and insurance preference. Use up to four times daily as directed. (FOR ICD-E11.21), Disp: 1 each, Rfl: 0 .  buPROPion (WELLBUTRIN XL) 150 MG 24 hr tablet, Take 450 mg by mouth daily. , Disp: , Rfl: 3 .  Continuous Blood Gluc Receiver (FREESTYLE LIBRE 14 DAY READER) DEVI, 1 applicator by Does not apply route 4 (four) times daily., Disp: 1 Device, Rfl: 1 .  Continuous Blood Gluc Sensor (FREESTYLE LIBRE 14 DAY SENSOR) MISC, Inject 1 application into the skin 4 (four) times daily as needed (uncontrolled diabetes)., Disp: 2 each, Rfl: 11 .  cyanocobalamin (,VITAMIN B-12,) 1000 MCG/ML injection, INJECT 1 ML (1,000 MCG TOTAL) INTO THE MUSCLE ONCE A WEEK. FOR 4 WEEKS, THEN MONTHLY THEREAFTER, Disp: 6 mL, Rfl: 4 .  cyclobenzaprine (FLEXERIL) 10 MG tablet, Take 5-10 mg by mouth 3 (three) times daily as needed for muscle spasms. , Disp: , Rfl: 12 .  diltiazem (CARDIZEM CD) 180 MG 24 hr capsule, TAKE 1 CAPSULE BY MOUTH EVERY DAY (Patient taking differently: Take 180 mg by mouth daily. TAKE 1 CAPSULE BY MOUTH EVERY DAY), Disp: 90 capsule, Rfl: 2 .  EPINEPHrine 0.3 mg/0.3 mL IJ SOAJ injection, Inject 0.3 mg into the muscle as needed for anaphylaxis. , Disp: , Rfl:  .  escitalopram (LEXAPRO) 20 MG tablet, Take 20 mg by mouth daily. , Disp: , Rfl:  .  fluticasone (FLONASE) 50 MCG/ACT nasal spray, SPRAY 2 SPRAYS INTO EACH NOSTRIL EVERY DAY, Disp: 16 mL, Rfl: 1 .  INS SYRINGE/NEEDLE 1CC/28G (B-D INS SYR MICROFINE 1CC/28G) 28G X 1/2" 1 ML MISC, USE 1 SYRINGE DAILY AFTER SUPPER, Disp: 100 each, Rfl: 3 .  insulin aspart (NOVOLOG FLEXPEN) 100 UNIT/ML FlexPen, Inject 15  units + sliding scale under the skin with meals. Add 3-5 units for every blood sugar 50 mg/dL over 150. Max dose of 30 units with meals., Disp: 30 mL, Rfl: 2 .  insulin glargine (LANTUS) 100 UNIT/ML injection, Inject 0.2 mLs (20 Units total) into the skin daily., Disp: 6 mL, Rfl: 0 .  Insulin Pen Needle (BD PEN NEEDLE NANO U/F) 32G X 4 MM MISC, Please specify directions, refills and quantity, Disp: 100 each, Rfl: 11 .  Insulin Syringe-Needle U-100 (B-D INS SYR ULTRAFINE 1CC/31G) 31G X 5/16" 1 ML MISC, 1 Syringe by Does not apply route  daily., Disp: 100 each, Rfl: 0 .  lamoTRIgine (LAMICTAL) 150 MG tablet, Take 150 mg by mouth daily., Disp: , Rfl:  .  Lancet Device MISC, Test blood sugar two times daily, Disp: 100 each, Rfl: 6 .  loperamide (IMODIUM) 2 MG capsule, Take 2 capsules (4 mg total) by mouth as needed for diarrhea or loose stools., Disp: 30 capsule, Rfl: 0 .  LORazepam (ATIVAN) 0.5 MG tablet, Take 0.5-1 mg by mouth daily as needed. , Disp: , Rfl:  .  meclizine (ANTIVERT) 25 MG tablet, Take 1 tablet (25 mg total) by mouth 3 (three) times daily as needed for dizziness., Disp: 90 tablet, Rfl: 6 .  meloxicam (MOBIC) 15 MG tablet, Take 15 mg by mouth daily., Disp: , Rfl:  .  metFORMIN (GLUCOPHAGE-XR) 500 MG 24 hr tablet, TAKE 4 TABLETS BY MOUTH EVERY DAY WITH BREAKFAST (Patient taking differently: Take 2,000 mg by mouth daily with breakfast. Give w/food.), Disp: 360 tablet, Rfl: 0 .  NURTEC 75 MG TBDP, Take 1 tablet by mouth daily as needed., Disp: , Rfl:  .  nystatin (MYCOSTATIN/NYSTOP) powder, APPLY 1 APPLICATION TO AFFECTED AREA TWICE A DAY (Patient taking differently: Apply 1 g topically 2 (two) times daily as needed (affected area). Apply 1 application to affected area), Disp: 60 g, Rfl: 0 .  OnabotulinumtoxinA (BOTOX IJ), Inject as directed every 3 (three) months. For migraine, Disp: , Rfl:  .  ONETOUCH VERIO test strip, TEST BLOOD SUGAR 5 TIMES DAILY, Disp: 500 each, Rfl: 1 .   oxyCODONE-acetaminophen (PERCOCET) 10-325 MG tablet, Take 1 tablet by mouth every 6 (six) hours as needed for pain., Disp: , Rfl:  .  pregabalin (LYRICA) 50 MG capsule, Take 50-300 mg by mouth See admin instructions. Taking 1 (36m) capsule during the day if needed and 6 capsules (3063m at bedtime, Disp: , Rfl:  .  SUMAtriptan (IMITREX) 100 MG tablet, Take 100 mg by mouth every 2 (two) hours as needed for migraine. May repeat in 2 hours if headache persists or recurs., Disp: , Rfl:  .  SYRINGE-NEEDLE, DISP, 3 ML (BD INTEGRA SYRINGE) 25G X 1" 3 ML MISC, FOR USE WITH B12 INJECTIONS WEEKLY/MONTHLY, Disp: 25 each, Rfl: 0 .  Vitamin D, Ergocalciferol, (DRISDOL) 1.25 MG (50000 UT) CAPS capsule, TAKE 1 CAPSULE BY MOUTH ONE TIME PER WEEK, Disp: 12 capsule, Rfl: 1 .  predniSONE (DELTASONE) 10 MG tablet, 6 tablets on Day 1 , then reduce by 1 tablet daily until gone, Disp: 21 tablet, Rfl: 0  EXAM:  VITALS per patient if applicable:  GENERAL: alert, oriented, appears well and in no acute distress  HEENT: atraumatic, conjunttiva clear, no obvious abnormalities on inspection of external nose and ears  NECK: normal movements of the head and neck  LUNGS: on inspection no signs of respiratory distress, breathing rate appears normal, no obvious gross SOB, gasping or wheezing  CV: no obvious cyanosis  MS: moves all visible extremities without noticeable abnormality  PSYCH/NEURO: pleasant and cooperative, no obvious depression or anxiety, speech and thought processing grossly intact  ASSESSMENT AND PLAN:  Discussed the following assessment and plan:  Hospital discharge follow-up  COVID-19 virus infection  Uncontrolled type 2 diabetes mellitus with [facility]cemia [facility] with obtundation from COVID 19 INFECTION  requiring mechanical ventilatory  Support.    Patient is stable post discharge and has no new issues or  questions about discharge plans at the visit today for hospital follow up. All labs , imaging studies and progress notes from admission were reviewed with patient today.  She will remain in isolation until Nov 30.  And will return for BMET after isolation/quarantine has been lifted.   COVID-19 virus infection Source of infection is unclear  She apparently infected her husband.  Remain in quarantine until Nov 30  Diabetes mellitus type 2, uncontrolled ([place]) Advised to reduce the baseline dinnertime sliding scale from 15 units to 10 units to resolve the recurrent hypoglycemic events occurring at midnight due to decreased appetite .  Continue 15 units baseline with breakfast and lunch and 20 units Lantus for now. Follow up  With me once quarantine is over   Lab Results  Component Value Date   HGBA1C 14.7 (A) 09/19/2019       I discussed the assessment and treatment plan with the patient. The patient was provided an opportunity to ask questions and all were answered. The patient agreed with the plan and demonstrated an understanding of the instructions.   The patient was advised to call back or seek an in-person evaluation if the symptoms worsen or if the condition fails to improve as anticipated.  I provided  40 minutes of non-face-to-face time during this encounter reviewing patient's current problems and post surgeries.  Providing counseling on the above mentioned problems , and coordination  of care .  [name], MD

## 2019-11-04 DIAGNOSIS — Z09 Encounter for follow-up examination after completed treatment for conditions other than malignant neoplasm: Secondary | ICD-10-CM | POA: Insufficient documentation

## 2019-11-04 NOTE — Assessment & Plan Note (Signed)
Source of infection is unclear  She apparently infected her husband.  Remain in quarantine until Nov 30

## 2019-11-04 NOTE — Assessment & Plan Note (Addendum)
Patient was discharged on hospital on nov 6 from [facility] after developing hypoxic respiratory failure with obtundation from COVID 19 INFECTION  requiring mechanical ventilatory  Support.   Patient is stable post discharge and has no new issues or questions about discharge plans at the visit today for hospital follow up. All labs , imaging studies and progress notes from admission were reviewed with patient today.  She will remain in isolation until Nov 30.  And will return for BMET after isolation/quarantine has been lifted.

## 2019-11-04 NOTE — Assessment & Plan Note (Addendum)
With recent episode of  DKA caused by acute COVID 19 INFECTION. Advised to reduce the baseline dinnertime sliding scale from 15 units to 10 units to resolve the recurrent hypoglycemic events occurring at midnight due to decreased appetite .  Continue 15 units baseline with breakfast and lunch and 20 units Lantus for now. Follow up  With me once quarantine is over   Lab Results  Component Value Date   HGBA1C 14.7 (A) 09/19/2019

## 2019-11-10 DIAGNOSIS — G4733 Obstructive sleep apnea (adult) (pediatric): Secondary | ICD-10-CM

## 2019-11-10 DIAGNOSIS — R339 Retention of urine, unspecified: Secondary | ICD-10-CM

## 2019-11-10 DIAGNOSIS — G43909 Migraine, unspecified, not intractable, without status migrainosus: Secondary | ICD-10-CM

## 2019-11-10 DIAGNOSIS — Z6836 Body mass index (BMI) 36.0-36.9, adult: Secondary | ICD-10-CM

## 2019-11-10 DIAGNOSIS — U071 COVID-19: Secondary | ICD-10-CM

## 2019-11-10 DIAGNOSIS — F419 Anxiety disorder, unspecified: Secondary | ICD-10-CM

## 2019-11-10 DIAGNOSIS — J9601 Acute respiratory failure with hypoxia: Secondary | ICD-10-CM

## 2019-11-10 DIAGNOSIS — E11649 Type 2 diabetes mellitus with hypoglycemia without coma: Secondary | ICD-10-CM

## 2019-11-10 DIAGNOSIS — Z9884 Bariatric surgery status: Secondary | ICD-10-CM

## 2019-11-10 DIAGNOSIS — I10 Essential (primary) hypertension: Secondary | ICD-10-CM

## 2019-11-10 DIAGNOSIS — N170 Acute kidney failure with tubular necrosis: Secondary | ICD-10-CM

## 2019-11-10 DIAGNOSIS — J1289 Other viral pneumonia: Secondary | ICD-10-CM

## 2019-11-10 DIAGNOSIS — Z9181 History of falling: Secondary | ICD-10-CM

## 2019-11-10 DIAGNOSIS — E785 Hyperlipidemia, unspecified: Secondary | ICD-10-CM

## 2019-11-10 DIAGNOSIS — F329 Major depressive disorder, single episode, unspecified: Secondary | ICD-10-CM

## 2019-11-10 DIAGNOSIS — E559 Vitamin D deficiency, unspecified: Secondary | ICD-10-CM

## 2019-11-10 DIAGNOSIS — I479 Paroxysmal tachycardia, unspecified: Secondary | ICD-10-CM

## 2019-11-10 DIAGNOSIS — Z794 Long term (current) use of insulin: Secondary | ICD-10-CM

## 2019-11-11 ENCOUNTER — Other Ambulatory Visit: Payer: Self-pay

## 2019-11-11 ENCOUNTER — Telehealth: Payer: Self-pay | Admitting: Internal Medicine

## 2019-11-11 NOTE — Telephone Encounter (Signed)
Copied from [place] [phone]. Topic: General - Other >> Nov 11, 2019 11:27 AM Keene Breath wrote: Reason for CRM: Report missed visit today.  Would like to resume on Friday.  Please confirm and call with any questions.  CB# [phone]

## 2019-11-11 NOTE — Telephone Encounter (Signed)
Confirmed visit.

## 2019-11-11 NOTE — Telephone Encounter (Signed)
Verbal given ok to Resume on Friday. OK for [facility] nurse to advise.

## 2019-11-12 ENCOUNTER — Other Ambulatory Visit: Payer: Self-pay | Admitting: Internal Medicine

## 2019-11-13 NOTE — Telephone Encounter (Signed)
Copied from [place] [phone]. Topic: General - Other >> Nov 13, 2019  9:00 AM Rainey Pines A wrote: Hildred Alamin from [facility] Case management services called to inform Dr. [name] that she hasnt been able to reach patient . Best contact  [phone]

## 2019-11-13 NOTE — Telephone Encounter (Signed)
FYI

## 2019-11-13 NOTE — Telephone Encounter (Signed)
Copied from [place] [phone]. Topic: General - Other >> Nov 13, 2019  9:00 AM Rainey Pines A wrote: Hildred Alamin from [facility] Case management services called to inform Dr. [name] that she hasnt been able to reach patient . Best contact  [phone]

## 2019-11-14 ENCOUNTER — Telehealth: Payer: Self-pay | Admitting: *Deleted

## 2019-11-14 NOTE — Telephone Encounter (Signed)
Copied from [place] [id number]. Topic: General - Other >> Nov 14, 2019  1:07 PM Rainey Pines A wrote: Chrys Racer home health nurse called to inform Dr. [name] that Pt canceled PT appt today due to upset stomach and will continue next week. Chrys Racer [phone]

## 2019-11-17 ENCOUNTER — Telehealth: Payer: Self-pay | Admitting: Internal Medicine

## 2019-11-17 NOTE — Telephone Encounter (Signed)
I have put the form in the red folder.

## 2019-11-17 NOTE — Telephone Encounter (Signed)
Pt came in to drop off FMLA paperwork to be filled out. Paperwork is in color folder up front. Thank you!

## 2019-11-18 ENCOUNTER — Other Ambulatory Visit: Payer: Self-pay

## 2019-11-18 ENCOUNTER — Telehealth: Payer: Self-pay | Admitting: Internal Medicine

## 2019-11-18 DIAGNOSIS — R7401 Elevation of levels of liver transaminase levels: Secondary | ICD-10-CM

## 2019-11-18 DIAGNOSIS — E876 Hypokalemia: Secondary | ICD-10-CM

## 2019-11-18 NOTE — Telephone Encounter (Signed)
Lab Results  Component Value Date   HGBA1C 14.7 (A) 09/19/2019   It is too soon to check the a1c

## 2019-11-18 NOTE — Telephone Encounter (Signed)
Pt is coming into the office for labs tomorrow. Some labs have been ordered, just wanted to know if Dr. [name] wanted to add any other lab. Pt has a virtual with [place] on Friday.

## 2019-11-19 ENCOUNTER — Other Ambulatory Visit: Payer: Self-pay

## 2019-11-19 ENCOUNTER — Other Ambulatory Visit (INDEPENDENT_AMBULATORY_CARE_PROVIDER_SITE_OTHER): Payer: BC Managed Care – PPO

## 2019-11-19 DIAGNOSIS — R7401 Elevation of levels of liver transaminase levels: Secondary | ICD-10-CM | POA: Diagnosis not present

## 2019-11-19 DIAGNOSIS — E1111 Type 2 diabetes mellitus with ketoacidosis with coma: Secondary | ICD-10-CM

## 2019-11-19 DIAGNOSIS — E876 Hypokalemia: Secondary | ICD-10-CM | POA: Diagnosis not present

## 2019-11-19 LAB — HEPATIC FUNCTION PANEL
ALT: 15 U/L (ref 0–35)
AST: 11 U/L (ref 0–37)
Albumin: 2.8 g/dL — ABNORMAL LOW (ref 3.5–5.2)
Alkaline Phosphatase: 148 U/L — ABNORMAL HIGH (ref 39–117)
Bilirubin, Direct: 0.1 mg/dL (ref 0.0–0.3)
Total Bilirubin: 0.2 mg/dL (ref 0.2–1.2)
Total Protein: 5.4 g/dL — ABNORMAL LOW (ref 6.0–8.3)

## 2019-11-19 LAB — BASIC METABOLIC PANEL
BUN: 20 mg/dL (ref 6–23)
CO2: 29 mEq/L (ref 19–32)
Calcium: 8.7 mg/dL (ref 8.4–10.5)
Chloride: 104 mEq/L (ref 96–112)
Creatinine, Ser: 0.96 mg/dL (ref 0.40–1.20)
GFR: 61.68 mL/min (ref >60.00)
Glucose, Bld: 145 mg/dL — ABNORMAL HIGH (ref 70–99)
Potassium: 4.9 mEq/L (ref 3.5–5.1)
Sodium: 140 mEq/L (ref 135–145)

## 2019-11-19 LAB — MAGNESIUM: Magnesium: 1.7 mg/dL (ref 1.5–2.5)

## 2019-11-20 ENCOUNTER — Telehealth: Payer: Self-pay | Admitting: Internal Medicine

## 2019-11-20 DIAGNOSIS — Z0279 Encounter for issue of other medical certificate: Secondary | ICD-10-CM

## 2019-11-20 NOTE — Telephone Encounter (Signed)
FMLA forms have been faxed and pt is aware.

## 2019-11-20 NOTE — Telephone Encounter (Signed)
FMLA has been competed and signed and returned to [place] in red folder

## 2019-11-21 ENCOUNTER — Other Ambulatory Visit: Payer: Self-pay | Admitting: Internal Medicine

## 2019-11-21 ENCOUNTER — Encounter: Payer: Self-pay | Admitting: Internal Medicine

## 2019-11-21 ENCOUNTER — Other Ambulatory Visit: Payer: Self-pay

## 2019-11-21 ENCOUNTER — Ambulatory Visit (INDEPENDENT_AMBULATORY_CARE_PROVIDER_SITE_OTHER): Payer: BC Managed Care – PPO | Admitting: Internal Medicine

## 2019-11-21 VITALS — BP 135/75 | Ht 64.0 in | Wt 207.0 lb

## 2019-11-21 DIAGNOSIS — J9601 Acute respiratory failure with hypoxia: Secondary | ICD-10-CM | POA: Diagnosis not present

## 2019-11-21 DIAGNOSIS — R748 Abnormal levels of other serum enzymes: Secondary | ICD-10-CM | POA: Insufficient documentation

## 2019-11-21 DIAGNOSIS — H66001 Acute suppurative otitis media without spontaneous rupture of ear drum, right ear: Secondary | ICD-10-CM

## 2019-11-21 DIAGNOSIS — E1165 Type 2 diabetes mellitus with hyperglycemia: Secondary | ICD-10-CM

## 2019-11-21 MED ORDER — PREDNISONE 20 MG PO TABS: 20.0000 mg | ORAL_TABLET | Freq: Every day | ORAL | 0 refills | Status: AC

## 2019-11-21 MED ORDER — LEVOFLOXACIN 500 MG PO TABS: 500.0000 mg | ORAL_TABLET | Freq: Every day | ORAL | 0 refills | Status: DC

## 2019-11-21 NOTE — Assessment & Plan Note (Signed)
Likely iatrogenic from NG tube during hospitalization for COVID 19 respiratory failure.  levaquin 500 mg daily  7,  Prednisone 20 mg daily x 5 .  ENT eval if no improvement ( Sees  [employer])

## 2019-11-21 NOTE — Assessment & Plan Note (Signed)
Hypoxia has resolved even with exertion.  Fatigue is still predominant symptom.

## 2019-11-21 NOTE — Progress Notes (Signed)
Virtual Visit via [email or web address]  This visit type was conducted due to national recommendations for restrictions regarding the COVID-19 pandemic (e.g. social distancing).  This format is felt to be most appropriate for this patient at this time.  All issues noted in this document were discussed and addressed.  No physical exam was performed (except for noted visual exam findings with Video Visits).   I connected with@ on 11/21/19 at  3:30 PM EST by a video enabled telemedicine applicationand verified that I am speaking with the correct person using two identifiers. Location patient: home Location provider: work or home office Persons participating in the virtual visit: patient, provider  I discussed the limitations, risks, security and privacy concerns of performing an evaluation and management service by telephone and the availability of in person appointments. I also discussed with the patient that there may be a patient responsible charge related to this service. The patient expressed understanding and agreed to proceed.   Reason for visit: follow up  HPI:  covid 19  Lab Results  Component Value Date   HGBA1C 14.7 (A) 09/19/2019     Energy slowly improved,  .  Her sense of taste is returning, but her sense of smell is still . Marland Kitchen Off of oxygen since discharge and sats are 91 lowest  Most of the time the sts are 93 to 98 evern with PT>   bS still dropping into 40's in late evening despite reduction of dinner time insulin to 10 units novolog.  Lunchtime sugars still jumpign up to 250 despite use of 15 units of novolog at breakfast and lunch .  20 units basaglar   Right ear tinnitus and some pain.  Right sided sinus pain (broth frontal and maxillary ,  Post ng tube, intubation etc.  Using sudafed  with no change.  ROS: See pertinent positives and negatives per HPI.  Past Medical History:  Diagnosis Date  . Anemia   . Anxiety   . Depression 2010  . Diabetes mellitus   . Endometriosis   .  Headache, common migraine, intractable   . History of ovarian cyst    left sided, caused by her endometriosis (possibly an endometrioma), in her 58s.  Marland Kitchen Hyperlipidemia   . Hypertension   . Kidney stones    left  . Morbid obesity ([place])    s/p gastric bypass  . Personal hx of gastric bypass 2010  . Rosacea   . Vertigo     Past Surgical History:  Procedure Laterality Date  . DILATION AND CURETTAGE OF UTERUS  2011  . gastric bypass    . OVARIAN CYST REMOVAL    . ROTATOR CUFF REPAIR     right    Family History  Problem Relation Age of Onset  . Diabetes Mother   . Hypertension Mother   . Hypertension Father   . Diabetes Father   . Depression Other        strong hx of  . Breast cancer Paternal Grandmother 16  . Cancer Neg Hx     SOCIAL HX:  reports that she quit smoking about 23 years ago. Her smoking use included cigarettes. She has a 4.00 pack-year smoking history. She has never used smokeless tobacco. She reports that she does not drink alcohol or use drugs.   Current Outpatient Medications:  .  acetaminophen (TYLENOL) 325 MG tablet, Take 650 mg by mouth every 6 (six) hours as needed for mild pain. , Disp: , Rfl:  .  albuterol (  VENTOLIN HFA) 108 (90 Base) MCG/ACT inhaler, Inhale 2 puffs into the lungs every 6 (six) hours as needed for wheezing or shortness of breath., Disp: 18 g, Rfl: 0 .  aspirin-acetaminophen-caffeine (EXCEDRIN MIGRAINE) 250-250-65 MG tablet, Take 2 tablets by mouth every 6 (six) hours as needed for headache., Disp: , Rfl:  .  atorvastatin (LIPITOR) 20 MG tablet, TAKE 1 TABLET BY MOUTH EVERY DAY (Patient taking differently: Take 20 mg by mouth daily at 6 PM. ), Disp: 90 tablet, Rfl: 0 .  B-D INS SYR MICROFINE 1CC/28G 28G X 1/2" 1 ML MISC, USE 1 SYRINGE DAILY AFTER SUPPER, Disp: 100 each, Rfl: 1 .  benzonatate (TESSALON) 100 MG capsule, Take 1-2 capsules (100-200 mg total) by mouth 3 (three) times daily as needed for cough., Disp: 40 capsule, Rfl: 0 .  blood  glucose meter kit and supplies KIT, Dispense based on patient and insurance preference. Use up to four times daily as directed. (FOR ICD-E11.21), Disp: 1 each, Rfl: 0 .  buPROPion (WELLBUTRIN XL) 150 MG 24 hr tablet, Take 450 mg by mouth daily. , Disp: , Rfl: 3 .  Continuous Blood Gluc Receiver (FREESTYLE LIBRE 14 DAY READER) DEVI, 1 applicator by Does not apply route 4 (four) times daily., Disp: 1 Device, Rfl: 1 .  Continuous Blood Gluc Sensor (FREESTYLE LIBRE 14 DAY SENSOR) MISC, Inject 1 application into the skin 4 (four) times daily as needed (uncontrolled diabetes)., Disp: 2 each, Rfl: 11 .  cyanocobalamin (,VITAMIN B-12,) 1000 MCG/ML injection, INJECT 1 ML (1,000 MCG TOTAL) INTO THE MUSCLE ONCE A WEEK. FOR 4 WEEKS, THEN MONTHLY THEREAFTER, Disp: 6 mL, Rfl: 4 .  cyclobenzaprine (FLEXERIL) 10 MG tablet, Take 5-10 mg by mouth 3 (three) times daily as needed for muscle spasms. , Disp: , Rfl: 12 .  diltiazem (CARDIZEM CD) 180 MG 24 hr capsule, TAKE 1 CAPSULE BY MOUTH EVERY DAY (Patient taking differently: Take 180 mg by mouth daily. TAKE 1 CAPSULE BY MOUTH EVERY DAY), Disp: 90 capsule, Rfl: 2 .  EPINEPHrine 0.3 mg/0.3 mL IJ SOAJ injection, Inject 0.3 mg into the muscle as needed for anaphylaxis. , Disp: , Rfl:  .  escitalopram (LEXAPRO) 20 MG tablet, Take 20 mg by mouth daily. , Disp: , Rfl:  .  fluticasone (FLONASE) 50 MCG/ACT nasal spray, SPRAY 2 SPRAYS INTO EACH NOSTRIL EVERY DAY, Disp: 16 mL, Rfl: 1 .  INS SYRINGE/NEEDLE 1CC/28G (B-D INS SYR MICROFINE 1CC/28G) 28G X 1/2" 1 ML MISC, USE 1 SYRINGE DAILY AFTER SUPPER, Disp: 100 each, Rfl: 3 .  insulin aspart (NOVOLOG FLEXPEN) 100 UNIT/ML FlexPen, Inject 15 units + sliding scale under the skin with meals. Add 3-5 units for every blood sugar 50 mg/dL over 150. Max dose of 30 units with meals., Disp: 30 mL, Rfl: 2 .  insulin glargine (LANTUS) 100 UNIT/ML injection, Inject 0.2 mLs (20 Units total) into the skin daily., Disp: 6 mL, Rfl: 0 .  Insulin Pen  Needle (BD PEN NEEDLE NANO U/F) 32G X 4 MM MISC, Please specify directions, refills and quantity, Disp: 100 each, Rfl: 11 .  Insulin Syringe-Needle U-100 (B-D INS SYR ULTRAFINE 1CC/31G) 31G X 5/16" 1 ML MISC, 1 Syringe by Does not apply route daily., Disp: 100 each, Rfl: 0 .  lamoTRIgine (LAMICTAL) 150 MG tablet, Take 150 mg by mouth daily., Disp: , Rfl:  .  Lancet Device MISC, Test blood sugar two times daily, Disp: 100 each, Rfl: 6 .  loperamide (IMODIUM) 2 MG capsule, Take 2  capsules (4 mg total) by mouth as needed for diarrhea or loose stools., Disp: 30 capsule, Rfl: 0 .  LORazepam (ATIVAN) 0.5 MG tablet, Take 0.5-1 mg by mouth daily as needed. , Disp: , Rfl:  .  meclizine (ANTIVERT) 25 MG tablet, Take 1 tablet (25 mg total) by mouth 3 (three) times daily as needed for dizziness., Disp: 90 tablet, Rfl: 6 .  meloxicam (MOBIC) 15 MG tablet, Take 15 mg by mouth daily., Disp: , Rfl:  .  metFORMIN (GLUCOPHAGE-XR) 500 MG 24 hr tablet, TAKE 4 TABLETS BY MOUTH EVERY DAY WITH BREAKFAST (Patient taking differently: Take 2,000 mg by mouth daily with breakfast. Give w/food.), Disp: 360 tablet, Rfl: 0 .  NURTEC 75 MG TBDP, Take 1 tablet by mouth daily as needed., Disp: , Rfl:  .  nystatin (MYCOSTATIN/NYSTOP) powder, APPLY 1 APPLICATION TO AFFECTED AREA TWICE A DAY, Disp: 60 g, Rfl: 0 .  OnabotulinumtoxinA (BOTOX IJ), Inject as directed every 3 (three) months. For migraine, Disp: , Rfl:  .  ONETOUCH VERIO test strip, TEST BLOOD SUGAR 5 TIMES DAILY, Disp: 500 each, Rfl: 1 .  oxyCODONE-acetaminophen (PERCOCET) 10-325 MG tablet, Take 1 tablet by mouth every 6 (six) hours as needed for pain., Disp: , Rfl:  .  pregabalin (LYRICA) 50 MG capsule, Take 50-300 mg by mouth See admin instructions. Taking 1 ('50mg'$ ) capsule during the day if needed and 6 capsules ('300mg'$ ) at bedtime, Disp: , Rfl:  .  SUMAtriptan (IMITREX) 100 MG tablet, Take 100 mg by mouth every 2 (two) hours as needed for migraine. May repeat in 2 hours if  headache persists or recurs., Disp: , Rfl:  .  SYRINGE-NEEDLE, DISP, 3 ML (BD INTEGRA SYRINGE) 25G X 1" 3 ML MISC, FOR USE WITH B12 INJECTIONS WEEKLY/MONTHLY, Disp: 25 each, Rfl: 0 .  Vitamin D, Ergocalciferol, (DRISDOL) 1.25 MG (50000 UT) CAPS capsule, TAKE 1 CAPSULE BY MOUTH ONE TIME PER WEEK, Disp: 12 capsule, Rfl: 1 .  levofloxacin (LEVAQUIN) 500 MG tablet, Take 1 tablet (500 mg total) by mouth daily., Disp: 7 tablet, Rfl: 0 .  predniSONE (DELTASONE) 20 MG tablet, Take 1 tablet (20 mg total) by mouth daily with breakfast for 5 doses., Disp: 5 tablet, Rfl: 0  EXAM:  VITALS per patient if applicable:  GENERAL: alert, oriented, appears well and in no acute distress  HEENT: atraumatic, conjunttiva clear, no obvious abnormalities on inspection of external nose and ears  NECK: normal movements of the head and neck  LUNGS: on inspection no signs of respiratory distress, breathing rate appears normal, no obvious gross SOB, gasping or wheezing  CV: no obvious cyanosis  MS: moves all visible extremities without noticeable abnormality  PSYCH/NEURO: pleasant and cooperative, no obvious depression or anxiety, speech and thought processing grossly intact  ASSESSMENT AND PLAN:  Discussed the following assessment and plan:  Uncontrolled type 2 diabetes mellitus with hyperglycemia (HCC) - Plan: Hemoglobin A1c, Comprehensive metabolic panel  Acute exudative otitis media, right  Acute respiratory failure with hypoxia (HCC)  Elevated alkaline phosphatase level  Diabetes mellitus type 2, uncontrolled (HCC) Advised to continue 15 unties breakfast.  Increase lunchtime insulin to 20 units.  Stop mealtime insulin at dinner.  Continue 20 units Basaglar.     Acute exudative otitis media, right Likely iatrogenic from NG tube during hospitalization for COVID 19 respiratory failure.  levaquin 500 mg daily  7,  Prednisone 20 mg daily x 5 .  ENT eval if no improvement ( Sees  [facility])  Acute  respiratory failure with hypoxia (HCC) Hypoxia has resolved even with exertion.  Fatigue is still predominant symptom.   Elevated alkaline phosphatase level Mild, with no nausea or RUQ pain.  She  Is s.p cholecystectomy remotely . suspecte fatty liver  Repeat in one month     I discussed the assessment and treatment plan with the patient. The patient was provided an opportunity to ask questions and all were answered. The patient agreed with the plan and demonstrated an understanding of the instructions.   The patient was advised to call back or seek an in-person evaluation if the symptoms worsen or if the condition fails to improve as anticipated.  I provided  25 minutes of non-face-to-face time during this encounter reviewing patient's current problems and past procedures/imaging studies, providing counseling on the above mentioned problems , and coordination  of care .   [name], MD

## 2019-11-21 NOTE — Assessment & Plan Note (Signed)
Mild, with no nausea or RUQ pain.  She  Is s.p cholecystectomy remotely . suspecte fatty liver  Repeat in one month

## 2019-11-21 NOTE — Assessment & Plan Note (Signed)
Advised to continue 15 unties breakfast.  Increase lunchtime insulin to 20 units.  Stop mealtime insulin at dinner.  Continue 20 units Basaglar.

## 2019-11-23 ENCOUNTER — Other Ambulatory Visit: Payer: Self-pay | Admitting: Internal Medicine

## 2019-11-24 ENCOUNTER — Other Ambulatory Visit: Payer: Self-pay

## 2019-11-24 DIAGNOSIS — Z20822 Contact with and (suspected) exposure to covid-19: Secondary | ICD-10-CM

## 2019-11-25 LAB — NOVEL CORONAVIRUS, NAA: SARS-CoV-2, NAA: NOT DETECTED

## 2019-12-02 ENCOUNTER — Other Ambulatory Visit: Payer: Self-pay | Admitting: Internal Medicine

## 2019-12-02 ENCOUNTER — Other Ambulatory Visit: Payer: Self-pay | Admitting: Cardiovascular Disease

## 2019-12-10 ENCOUNTER — Other Ambulatory Visit: Payer: Self-pay | Admitting: Internal Medicine

## 2019-12-11 NOTE — Telephone Encounter (Signed)
Looks like medication was discontinued on 10/25/2019 at discharge.

## 2019-12-11 NOTE — Telephone Encounter (Signed)
My Chart message sent

## 2019-12-15 ENCOUNTER — Encounter: Payer: BC Managed Care – PPO | Admitting: Obstetrics and Gynecology

## 2019-12-15 ENCOUNTER — Ambulatory Visit (INDEPENDENT_AMBULATORY_CARE_PROVIDER_SITE_OTHER): Payer: BC Managed Care – PPO | Admitting: Internal Medicine

## 2019-12-15 ENCOUNTER — Encounter: Payer: Self-pay | Admitting: Internal Medicine

## 2019-12-15 ENCOUNTER — Other Ambulatory Visit: Payer: Self-pay

## 2019-12-15 VITALS — Ht 64.0 in | Wt 207.0 lb

## 2019-12-15 DIAGNOSIS — H9191 Unspecified hearing loss, right ear: Secondary | ICD-10-CM

## 2019-12-15 DIAGNOSIS — H66001 Acute suppurative otitis media without spontaneous rupture of ear drum, right ear: Secondary | ICD-10-CM | POA: Diagnosis not present

## 2019-12-15 DIAGNOSIS — U071 COVID-19: Secondary | ICD-10-CM | POA: Diagnosis not present

## 2019-12-15 DIAGNOSIS — E11649 Type 2 diabetes mellitus with hypoglycemia without coma: Secondary | ICD-10-CM

## 2019-12-15 DIAGNOSIS — H9201 Otalgia, right ear: Secondary | ICD-10-CM | POA: Diagnosis not present

## 2019-12-15 NOTE — Progress Notes (Signed)
Virtual Visit via [email or web address]  This visit type was conducted due to national recommendations for restrictions regarding the COVID-19 pandemic (e.g. social distancing).  This format is felt to be most appropriate for this patient at this time.  All issues noted in this document were discussed and addressed.  No physical exam was performed (except for noted visual exam findings with Video Visits).   I connected with@ on 12/15/19 at  4:00 PM EST by a video enabled telemedicine application  and verified that I am speaking with the correct person using two identifiers. Location patient: home Location provider: work or home office Persons participating in the virtual visit: patient, provider  I discussed the limitations, risks, security and privacy concerns of performing an evaluation and management service by telephone and the availability of in person appointments. I also discussed with the patient that there may be a patient responsible charge related to this service. The patient expressed understanding and agreed to proceed.  Reason for visit: follow up on COVID 19 INFECTION   HPI:  49 yr old female with T2 DM, uncontrolled , diagnosed with COVID 19 INFECTION  OCT 23.  Hospitalized in Nov for hypoxic respiratory failure, discharged to home  with steady improvement in all symptoms. 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  Needs Letter to return to work but would like to try returning half days FROM HOME.   Type 2 DM:  Using novolog and Basaglar.  Still having  Lability of blood sugars,  bottomign out late at night,  Despite post prandial often up to 300 in the evening  jas been  Using  novolog sliding scale with each  meal and recent increased Basaglar  Dose to 25 units.    Right ear improved transiently with augmentin  and prednisone ,  But has begun hurting again,  And the decreased hearing and tinnnitus still problematic.    ROS: See pertinent positives and negatives per HPI.  Past Medical History:  Diagnosis Date  . Anemia   . Anxiety   . Depression 2010  . Diabetes mellitus   . Endometriosis   . Headache, common migraine, intractable   . History of ovarian cyst    left sided, caused by her endometriosis (possibly an endometrioma), in her 75s.  Marland Kitchen Hyperlipidemia   . Hypertension   . Kidney stones    left  . Morbid obesity ([place])    s/p gastric bypass  . Personal hx of gastric bypass 2010  . Rosacea   . Vertigo     Past Surgical History:  Procedure Laterality Date  . DILATION AND CURETTAGE OF UTERUS  2011  . gastric bypass    . OVARIAN CYST REMOVAL    . ROTATOR CUFF REPAIR     right    Family History  Problem Relation Age of Onset  . Diabetes Mother   . Hypertension Mother   . Hypertension Father   . Diabetes Father   . Depression Other        strong hx of  . Breast cancer Paternal Grandmother 80  . Cancer Neg Hx     SOCIAL HX:  reports that she quit smoking about 23 years ago. Her smoking use included  cigarettes. She has a 4.00 pack-year smoking history. She has never used smokeless tobacco. She reports that she does not drink alcohol or use drugs.  Current Outpatient Medications:  .  acetaminophen (TYLENOL) 325 MG tablet, Take 650 mg by mouth every 6 (six) hours as needed for mild pain. , Disp: , Rfl:  .  albuterol (VENTOLIN HFA) 108 (90 Base) MCG/ACT inhaler, Inhale 2 puffs into the lungs every 6 (six) hours as needed for wheezing or shortness of breath., Disp: 18 g, Rfl: 0 .  aspirin-acetaminophen-caffeine (EXCEDRIN MIGRAINE) 250-250-65 MG tablet, Take 2 tablets by mouth every 6 (six) hours as needed for headache., Disp: , Rfl:  .  atorvastatin (LIPITOR) 20 MG tablet,  TAKE 1 TABLET BY MOUTH EVERY DAY (Patient taking differently: Take 20 mg by mouth daily at 6 PM. ), Disp: 90 tablet, Rfl: 0 .  B-D INS SYR MICROFINE 1CC/28G 28G X 1/2" 1 ML MISC, USE 1 SYRINGE DAILY AFTER SUPPER, Disp: 100 each, Rfl: 1 .  blood glucose meter kit and supplies KIT, Dispense based on patient and insurance preference. Use up to four times daily as directed. (FOR ICD-E11.21), Disp: 1 each, Rfl: 0 .  buPROPion (WELLBUTRIN XL) 150 MG 24 hr tablet, Take 450 mg by mouth daily. , Disp: , Rfl: 3 .  Continuous Blood Gluc Receiver (FREESTYLE LIBRE 14 DAY READER) DEVI, 1 applicator by Does not apply route 4 (four) times daily., Disp: 1 Device, Rfl: 1 .  Continuous Blood Gluc Sensor (FREESTYLE LIBRE 14 DAY SENSOR) MISC, Inject 1 application into the skin 4 (four) times daily as needed (uncontrolled diabetes)., Disp: 2 each, Rfl: 11 .  cyanocobalamin (,VITAMIN B-12,) 1000 MCG/ML injection, INJECT 1 ML (1,000 MCG TOTAL) INTO THE MUSCLE ONCE A WEEK. FOR 4 WEEKS, THEN MONTHLY THEREAFTER, Disp: 6 mL, Rfl: 4 .  cyclobenzaprine (FLEXERIL) 10 MG tablet, Take 5-10 mg by mouth 3 (three) times daily as needed for muscle spasms. , Disp: , Rfl: 12 .  diltiazem (CARDIZEM CD) 180 MG 24 hr capsule, TAKE 1 CAPSULE BY MOUTH EVERY DAY (Patient taking differently: Take 180 mg by mouth daily. TAKE 1 CAPSULE BY MOUTH EVERY DAY), Disp: 90 capsule, Rfl: 2 .  EPINEPHrine 0.3 mg/0.3 mL IJ SOAJ injection, Inject 0.3 mg into the muscle as needed for anaphylaxis. , Disp: , Rfl:  .  escitalopram (LEXAPRO) 20 MG tablet, Take 20 mg by mouth daily. , Disp: , Rfl:  .  fluticasone (FLONASE) 50 MCG/ACT nasal spray, SPRAY 2 SPRAYS INTO EACH NOSTRIL EVERY DAY, Disp: 48 mL, Rfl: 1 .  INS SYRINGE/NEEDLE 1CC/28G (B-D INS SYR MICROFINE 1CC/28G) 28G X 1/2" 1 ML MISC, USE 1 SYRINGE DAILY AFTER SUPPER, Disp: 100 each, Rfl: 3 .  insulin aspart (NOVOLOG FLEXPEN) 100 UNIT/ML FlexPen, Inject 15 units + sliding scale under the skin with meals. Add  3-5 units for every blood sugar 50 mg/dL over 150. Max dose of 30 units with meals., Disp: 30 mL, Rfl: 2 .  Insulin Glargine (BASAGLAR KWIKPEN) 100 UNIT/ML SOPN, Inject 0.5 mLs (50 Units total) into the skin daily., Disp: 45 mL, Rfl: 3 .  Insulin Pen Needle (BD PEN NEEDLE NANO U/F) 32G X 4 MM MISC, Please specify directions, refills and quantity, Disp: 100 each, Rfl: 11 .  Insulin Syringe-Needle U-100 (B-D INS SYR ULTRAFINE 1CC/31G) 31G X 5/16" 1 ML MISC, 1 Syringe by Does not apply route daily., Disp: 100 each, Rfl: 0 .  lamoTRIgine (LAMICTAL) 150 MG tablet, Take 150 mg  by mouth daily., Disp: , Rfl:  .  Lancet Device MISC, Test blood sugar two times daily, Disp: 100 each, Rfl: 6 .  loperamide (IMODIUM) 2 MG capsule, Take 2 capsules (4 mg total) by mouth as needed for diarrhea or loose stools., Disp: 30 capsule, Rfl: 0 .  LORazepam (ATIVAN) 0.5 MG tablet, Take 0.5-1 mg by mouth daily as needed. , Disp: , Rfl:  .  meclizine (ANTIVERT) 25 MG tablet, Take 1 tablet (25 mg total) by mouth 3 (three) times daily as needed for dizziness., Disp: 90 tablet, Rfl: 6 .  NURTEC 75 MG TBDP, Take 1 tablet by mouth daily as needed., Disp: , Rfl:  .  nystatin (MYCOSTATIN/NYSTOP) powder, APPLY 1 APPLICATION TO AFFECTED AREA TWICE A DAY, Disp: 60 g, Rfl: 0 .  OnabotulinumtoxinA (BOTOX IJ), Inject as directed every 3 (three) months. For migraine, Disp: , Rfl:  .  ONETOUCH VERIO test strip, TEST BLOOD SUGAR 5 TIMES DAILY, Disp: 500 each, Rfl: 1 .  oxyCODONE-acetaminophen (PERCOCET) 10-325 MG tablet, Take 1 tablet by mouth every 6 (six) hours as needed for pain., Disp: , Rfl:  .  pregabalin (LYRICA) 50 MG capsule, Take 50-300 mg by mouth See admin instructions. Taking 1 ('50mg'$ ) capsule during the day if needed and 6 capsules ('300mg'$ ) at bedtime, Disp: , Rfl:  .  SUMAtriptan (IMITREX) 100 MG tablet, Take 100 mg by mouth every 2 (two) hours as needed for migraine. May repeat in 2 hours if headache persists or recurs., Disp: ,  Rfl:  .  SYRINGE-NEEDLE, DISP, 3 ML (BD INTEGRA SYRINGE) 25G X 1" 3 ML MISC, FOR USE WITH B12 INJECTIONS WEEKLY/MONTHLY, Disp: 25 each, Rfl: 0 .  Vitamin D, Ergocalciferol, (DRISDOL) 1.25 MG (50000 UT) CAPS capsule, TAKE 1 CAPSULE BY MOUTH ONE TIME PER WEEK, Disp: 12 capsule, Rfl: 1 .  meloxicam (MOBIC) 15 MG tablet, Take 15 mg by mouth daily., Disp: , Rfl:  .  metFORMIN (GLUCOPHAGE-XR) 500 MG 24 hr tablet, TAKE 4 TABLETS BY MOUTH EVERY DAY WITH BREAKFAST (Patient not taking: Reported on 12/15/2019), Disp: 360 tablet, Rfl: 0  EXAM:  VITALS per patient if applicable:  GENERAL: alert, oriented, appears well and in no acute distress  HEENT: atraumatic, conjunttiva clear, no obvious abnormalities on inspection of external nose and ears  NECK: normal movements of the head and neck  LUNGS: on inspection no signs of respiratory distress, breathing rate appears normal, no obvious gross SOB, gasping or wheezing  CV: no obvious cyanosis  MS: moves all visible extremities without noticeable abnormality  PSYCH/NEURO: pleasant and cooperative, no obvious depression or anxiety, speech and thought processing grossly intact  ASSESSMENT AND PLAN:  Discussed the following assessment and plan:  Acute exudative otitis media, right  Decreased hearing of right ear - Plan: Ambulatory referral to ENT  Ear pain, referred, right - Plan: Ambulatory referral to ENT  COVID-19 virus infection  Uncontrolled type 2 diabetes mellitus with hypoglycemia without coma ([place])  Acute exudative otitis media, right Likely iatrogenic from NG tube during hospitalization for COVID 19 respiratory failure.  Her symptoms improved transiently with empiric  levaquin 500 mg daily  7,  Prednisone 20 mg daily x 5  But have returned and she now has tinnitus, pressure and loss of hearing .  Referral to  ENT evaluation for treatment; she may need myringotomy ( Sees  [facility])   COVID-19 virus infection She has not recovered fully,   And has fatigue,  decreased concentration, and short term  memory deficits.  She will not be able to return to work full time in this state.  Recommend return to work half times for two weeks .  Letter written   Diabetes mellitus type 2, uncontrolled ([place]) She continues to have increased need for mealtime insulin but continues to have frequent nocturnal lows.  Recommend reducing the dinnertime novolog to 5 units and increasing Basaglar to 30 units.     I discussed the assessment and treatment plan with the patient. The patient was provided an opportunity to ask questions and all were answered. The patient agreed with the plan and demonstrated an understanding of the instructions.   The patient was advised to call back or seek an in-person evaluation if the symptoms worsen or if the condition fails to improve as anticipated.    [name], MD

## 2019-12-15 NOTE — Assessment & Plan Note (Addendum)
She has not recovered fully,  And has fatigue,  decreased concentration, and short term memory deficits.  She will not be able to return to work full time in this state.  Recommend return to work half times for two weeks .  Letter written

## 2019-12-15 NOTE — Assessment & Plan Note (Signed)
Likely iatrogenic from NG tube during hospitalization for COVID 19 respiratory failure.  Her symptoms improved transiently with empiric  levaquin 500 mg daily  7,  Prednisone 20 mg daily x 5  But have returned and she now has tinnitus, pressure and loss of hearing .  Referral to  ENT evaluation for treatment; she may need myringotomy ( Sees  [facility])

## 2019-12-15 NOTE — Assessment & Plan Note (Signed)
She continues to have increased need for mealtime insulin but continues to have frequent nocturnal lows.  Recommend reducing the dinnertime novolog to 5 units and increasing Basaglar to 30 units.

## 2019-12-30 ENCOUNTER — Ambulatory Visit (INDEPENDENT_AMBULATORY_CARE_PROVIDER_SITE_OTHER): Payer: BC Managed Care – PPO | Admitting: Internal Medicine

## 2019-12-30 ENCOUNTER — Encounter: Payer: Self-pay | Admitting: Internal Medicine

## 2019-12-30 ENCOUNTER — Other Ambulatory Visit: Payer: Self-pay

## 2019-12-30 DIAGNOSIS — H9191 Unspecified hearing loss, right ear: Secondary | ICD-10-CM | POA: Diagnosis not present

## 2019-12-30 DIAGNOSIS — U071 COVID-19: Secondary | ICD-10-CM

## 2019-12-30 DIAGNOSIS — E11649 Type 2 diabetes mellitus with hypoglycemia without coma: Secondary | ICD-10-CM

## 2019-12-30 NOTE — Assessment & Plan Note (Signed)
Secondary to shingles. 75% hearing loss

## 2019-12-30 NOTE — Progress Notes (Signed)
Virtual Visit via [email or web address]  This visit type was conducted due to national recommendations for restrictions regarding the COVID-19 pandemic (e.g. social distancing).  This format is felt to be most appropriate for this patient at this time.  All issues noted in this document were discussed and addressed.  No physical exam was performed (except for noted visual exam findings with Video Visits).   I connected with@ on 12/30/19 at  4:00 PM EST by a video enabled telemedicine application and verified that I am speaking with the correct person using two identifiers. Location patient: home Location provider: work or home office Persons participating in the virtual visit: patient, provider  I discussed the limitations, risks, security and privacy concerns of performing an evaluation and management service by telephone and the availability of in person appointments. I also discussed with the patient that there may be a patient responsible charge related to this service. The patient expressed understanding and agreed to proceed.  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  HAS APPT WITH hearing aid specialist for evaluation of options for treatment  Has been working half days for the past week.  4 to 6 hours,  Exhausted by end of it an dhas to lie down.  Also having frontal headaches daily that make it hard to concentrate.  Not able to work a full day yet .  Needs letter for 2 week delay to full time   ROS: See pertinent positives and negatives per HPI.  Past Medical History:  Diagnosis Date  . Anemia   . Anxiety   . Depression 2010  . Diabetes mellitus   . Endometriosis   . Headache, common migraine, intractable   . History of ovarian cyst    left sided, caused by her endometriosis (possibly an endometrioma), in her 75s.  Marland Kitchen Hyperlipidemia   .  Hypertension   . Kidney stones    left  . Morbid obesity ([place])    s/p gastric bypass  . Personal hx of gastric bypass 2010  . Rosacea   . Vertigo     Past Surgical History:  Procedure Laterality Date  . DILATION AND CURETTAGE OF UTERUS  2011  . gastric bypass    . OVARIAN CYST REMOVAL    . ROTATOR CUFF REPAIR     right    Family History  Problem Relation Age of Onset  . Diabetes Mother   . Hypertension Mother   . Hypertension Father   . Diabetes Father   . Depression Other        strong hx of  . Breast cancer Paternal Grandmother 44  . Cancer Neg Hx     SOCIAL HX:  reports that she quit smoking about 23 years ago. Her smoking use included cigarettes. She has a 4.00 pack-year smoking history. She has never used smokeless tobacco. She reports that she does not drink alcohol or use drugs.   Current Outpatient Medications:  .  acetaminophen (TYLENOL) 325 MG tablet, Take 650 mg by mouth every 6 (six) hours as needed for mild pain. , Disp: , Rfl:  .  albuterol (VENTOLIN HFA) 108 (90 Base) MCG/ACT inhaler, Inhale 2 puffs into the lungs every 6 (six) hours as needed for wheezing or shortness of breath., Disp: 18 g, Rfl: 0 .  aspirin-acetaminophen-caffeine (EXCEDRIN MIGRAINE) 250-250-65 MG tablet, Take 2 tablets by mouth every  6 (six) hours as needed for headache., Disp: , Rfl:  .  atorvastatin (LIPITOR) 20 MG tablet, TAKE 1 TABLET BY MOUTH EVERY DAY (Patient taking differently: Take 20 mg by mouth daily at 6 PM. ), Disp: 90 tablet, Rfl: 0 .  azelastine (ASTELIN) 0.1 % nasal spray, INSTILL 1 2 SPRAYS IN EACH NOSTRIL TWICE A DAY, Disp: , Rfl:  .  B-D INS SYR MICROFINE 1CC/28G 28G X 1/2" 1 ML MISC, USE 1 SYRINGE DAILY AFTER SUPPER, Disp: 100 each, Rfl: 1 .  blood glucose meter kit and supplies KIT, Dispense based on patient and insurance preference. Use up to four times daily as directed. (FOR ICD-E11.21), Disp: 1 each, Rfl: 0 .  buPROPion (WELLBUTRIN XL) 150 MG 24 hr tablet, Take 450  mg by mouth daily. , Disp: , Rfl: 3 .  Continuous Blood Gluc Receiver (FREESTYLE LIBRE 14 DAY READER) DEVI, 1 applicator by Does not apply route 4 (four) times daily., Disp: 1 Device, Rfl: 1 .  Continuous Blood Gluc Sensor (FREESTYLE LIBRE 14 DAY SENSOR) MISC, Inject 1 application into the skin 4 (four) times daily as needed (uncontrolled diabetes)., Disp: 2 each, Rfl: 11 .  cyanocobalamin (,VITAMIN B-12,) 1000 MCG/ML injection, INJECT 1 ML (1,000 MCG TOTAL) INTO THE MUSCLE ONCE A WEEK. FOR 4 WEEKS, THEN MONTHLY THEREAFTER, Disp: 6 mL, Rfl: 4 .  cyclobenzaprine (FLEXERIL) 10 MG tablet, Take 5-10 mg by mouth 3 (three) times daily as needed for muscle spasms. , Disp: , Rfl: 12 .  diltiazem (CARDIZEM CD) 180 MG 24 hr capsule, TAKE 1 CAPSULE BY MOUTH EVERY DAY (Patient taking differently: Take 180 mg by mouth daily. TAKE 1 CAPSULE BY MOUTH EVERY DAY), Disp: 90 capsule, Rfl: 2 .  EPINEPHrine 0.3 mg/0.3 mL IJ SOAJ injection, Inject 0.3 mg into the muscle as needed for anaphylaxis. , Disp: , Rfl:  .  escitalopram (LEXAPRO) 20 MG tablet, Take 20 mg by mouth daily. , Disp: , Rfl:  .  fluticasone (FLONASE) 50 MCG/ACT nasal spray, SPRAY 2 SPRAYS INTO EACH NOSTRIL EVERY DAY, Disp: 48 mL, Rfl: 1 .  HORIZANT 600 MG TBCR, Take 1 tablet by mouth 2 (two) times daily., Disp: , Rfl:  .  INS SYRINGE/NEEDLE 1CC/28G (B-D INS SYR MICROFINE 1CC/28G) 28G X 1/2" 1 ML MISC, USE 1 SYRINGE DAILY AFTER SUPPER, Disp: 100 each, Rfl: 3 .  insulin aspart (NOVOLOG FLEXPEN) 100 UNIT/ML FlexPen, Inject 15 units + sliding scale under the skin with meals. Add 3-5 units for every blood sugar 50 mg/dL over 150. Max dose of 30 units with meals., Disp: 30 mL, Rfl: 2 .  Insulin Glargine (BASAGLAR KWIKPEN) 100 UNIT/ML SOPN, Inject 0.5 mLs (50 Units total) into the skin daily., Disp: 45 mL, Rfl: 3 .  Insulin Pen Needle (BD PEN NEEDLE NANO U/F) 32G X 4 MM MISC, Please specify directions, refills and quantity, Disp: 100 each, Rfl: 11 .  Insulin  Syringe-Needle U-100 (B-D INS SYR ULTRAFINE 1CC/31G) 31G X 5/16" 1 ML MISC, 1 Syringe by Does not apply route daily., Disp: 100 each, Rfl: 0 .  lamoTRIgine (LAMICTAL) 150 MG tablet, Take 150 mg by mouth daily., Disp: , Rfl:  .  Lancet Device MISC, Test blood sugar two times daily, Disp: 100 each, Rfl: 6 .  loperamide (IMODIUM) 2 MG capsule, Take 2 capsules (4 mg total) by mouth as needed for diarrhea or loose stools., Disp: 30 capsule, Rfl: 0 .  LORazepam (ATIVAN) 0.5 MG tablet, Take 0.5-1 mg by mouth  daily as needed. , Disp: , Rfl:  .  meclizine (ANTIVERT) 25 MG tablet, Take 1 tablet (25 mg total) by mouth 3 (three) times daily as needed for dizziness., Disp: 90 tablet, Rfl: 6 .  meloxicam (MOBIC) 15 MG tablet, Take 15 mg by mouth daily., Disp: , Rfl:  .  metFORMIN (GLUCOPHAGE-XR) 500 MG 24 hr tablet, TAKE 4 TABLETS BY MOUTH EVERY DAY WITH BREAKFAST, Disp: 360 tablet, Rfl: 0 .  NURTEC 75 MG TBDP, Take 1 tablet by mouth daily as needed., Disp: , Rfl:  .  nystatin (MYCOSTATIN/NYSTOP) powder, APPLY 1 APPLICATION TO AFFECTED AREA TWICE A DAY, Disp: 60 g, Rfl: 0 .  OnabotulinumtoxinA (BOTOX IJ), Inject as directed every 3 (three) months. For migraine, Disp: , Rfl:  .  ONETOUCH VERIO test strip, TEST BLOOD SUGAR 5 TIMES DAILY, Disp: 500 each, Rfl: 1 .  oxyCODONE-acetaminophen (PERCOCET) 10-325 MG tablet, Take 1 tablet by mouth every 6 (six) hours as needed for pain., Disp: , Rfl:  .  pregabalin (LYRICA) 50 MG capsule, Take 50-300 mg by mouth See admin instructions. Taking 1 (10m) capsule during the day if needed and 6 capsules (3040m at bedtime, Disp: , Rfl:  .  SUMAtriptan (IMITREX) 100 MG tablet, Take 100 mg by mouth every 2 (two) hours as needed for migraine. May repeat in 2 hours if headache persists or recurs., Disp: , Rfl:  .  SYRINGE-NEEDLE, DISP, 3 ML (BD INTEGRA SYRINGE) 25G X 1" 3 ML MISC, FOR USE WITH B12 INJECTIONS WEEKLY/MONTHLY, Disp: 25 each, Rfl: 0 .  Vitamin D, Ergocalciferol, (DRISDOL)  1.25 MG (50000 UT) CAPS capsule, TAKE 1 CAPSULE BY MOUTH ONE TIME PER WEEK, Disp: 12 capsule, Rfl: 1  EXAM:  VITALS per patient if applicable:  GENERAL: alert, oriented, appears well and in no acute distress  HEENT: atraumatic, conjunttiva clear, no obvious abnormalities on inspection of external nose and ears  NECK: normal movements of the head and neck  LUNGS: on inspection no signs of respiratory distress, breathing rate appears normal, no obvious gross SOB, gasping or wheezing  CV: no obvious cyanosis  MS: moves all visible extremities without noticeable abnormality  PSYCH/NEURO: pleasant and cooperative, no obvious depression or anxiety, speech and thought processing grossly intact  ASSESSMENT AND PLAN:  Discussed the following assessment and plan:  COVID-19 virus infection  COVID-19 virus infection She has improved clinically with improved fatigue but remains exhausted after 4 hours of work,  And will need to postpone returning to work Full time work for another 2 weeks.     I discussed the assessment and treatment plan with the patient. The patient was provided an opportunity to ask questions and all were answered. The patient agreed with the plan and demonstrated an understanding of the instructions.   The patient was advised to call back or seek an in-person evaluation if the symptoms worsen or if the condition fails to improve as anticipated.   TeCrecencio McMD

## 2019-12-30 NOTE — Assessment & Plan Note (Signed)
She has improved clinically with improved fatigue but remains exhausted after 4 hours of work,  And will need to postpone returning to work Full time work for another 2 weeks.

## 2019-12-30 NOTE — Assessment & Plan Note (Signed)
She has improved her basal insulin to 35 units and reduced her evening dose of humalog pre dinner with a reduction in the number of hypoglycemic events.

## 2020-01-05 NOTE — Progress Notes (Signed)
Called patient in reference to items in the safe. States that she will be here tomorrow (01-06-20) to pick up.

## 2020-01-06 ENCOUNTER — Encounter: Payer: Self-pay | Admitting: Obstetrics and Gynecology

## 2020-01-09 ENCOUNTER — Other Ambulatory Visit: Payer: Self-pay

## 2020-01-09 ENCOUNTER — Ambulatory Visit: Payer: BC Managed Care – PPO | Admitting: Podiatry

## 2020-01-09 DIAGNOSIS — L989 Disorder of the skin and subcutaneous tissue, unspecified: Secondary | ICD-10-CM | POA: Diagnosis not present

## 2020-01-09 DIAGNOSIS — E0843 Diabetes mellitus due to underlying condition with diabetic autonomic (poly)neuropathy: Secondary | ICD-10-CM

## 2020-01-09 DIAGNOSIS — L600 Ingrowing nail: Secondary | ICD-10-CM | POA: Diagnosis not present

## 2020-01-09 DIAGNOSIS — L97322 Non-pressure chronic ulcer of left ankle with fat layer exposed: Secondary | ICD-10-CM | POA: Diagnosis not present

## 2020-01-09 MED ORDER — DOXYCYCLINE HYCLATE 100 MG PO TABS: 100.0000 mg | ORAL_TABLET | Freq: Two times a day (BID) | ORAL | 0 refills | Status: DC

## 2020-01-09 MED ORDER — GENTAMICIN SULFATE 0.1 % EX CREA: 1.0000 "application " | TOPICAL_CREAM | Freq: Two times a day (BID) | CUTANEOUS | 1 refills | Status: DC

## 2020-01-13 ENCOUNTER — Ambulatory Visit (INDEPENDENT_AMBULATORY_CARE_PROVIDER_SITE_OTHER): Payer: BC Managed Care – PPO | Admitting: Internal Medicine

## 2020-01-13 ENCOUNTER — Encounter: Payer: Self-pay | Admitting: Internal Medicine

## 2020-01-13 VITALS — Ht 64.0 in | Wt 207.0 lb

## 2020-01-13 DIAGNOSIS — T732XXD Exhaustion due to exposure, subsequent encounter: Secondary | ICD-10-CM

## 2020-01-13 DIAGNOSIS — E11649 Type 2 diabetes mellitus with hypoglycemia without coma: Secondary | ICD-10-CM | POA: Diagnosis not present

## 2020-01-13 DIAGNOSIS — R519 Headache, unspecified: Secondary | ICD-10-CM

## 2020-01-13 DIAGNOSIS — G933 Postviral fatigue syndrome: Secondary | ICD-10-CM | POA: Diagnosis not present

## 2020-01-13 DIAGNOSIS — G9331 Postviral fatigue syndrome: Secondary | ICD-10-CM | POA: Insufficient documentation

## 2020-01-13 MED ORDER — DILTIAZEM HCL ER COATED BEADS 240 MG PO CP24: ORAL_CAPSULE | ORAL | 0 refills | Status: DC

## 2020-01-13 MED ORDER — TRAMADOL HCL 50 MG PO TABS: ORAL_TABLET | ORAL | 1 refills | Status: DC

## 2020-01-13 NOTE — Progress Notes (Signed)
Virtual Visit via [email or web address]  This visit type was conducted due to national recommendations for restrictions regarding the COVID-19 pandemic (e.g. social distancing).  This format is felt to be most appropriate for this patient at this time.  All issues noted in this document were discussed and addressed.  No physical exam was performed (except for noted visual exam findings with Video Visits).   I connected with@ on 01/13/20 at  2:00 PM EST by a video enabled telemedicine application and verified that I am speaking with the correct person using two identifiers. Location patient: home Location provider: work or home office Persons participating in the virtual visit: patient, provider  I discussed the limitations, risks, security and privacy concerns of performing an evaluation and management service by telephone and the availability of in person appointments. I also discussed with the patient that there may be a patient responsible charge related to this service. The patient expressed understanding and agreed to proceed.  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  She has been working half days  For the past month,  But continues to Still feels wiped out by the end of a 4 hour period that frequently extends to 6 hours .    She continues to have headaches occurring 2-3 times per week,  and has been having joint pain and Fibromyalgia pain . She attributes the frequency to the suspension of the botox injections  During her recent illness and she resumed botox injections 2 weeks ago.  Her neurologist has given her oxycodone which she uses sparingly .  Prefers to use tramadol prn      Lab Results  Component Value Date   HGBA1C 14.7 (A) 09/19/2019     ROS: See pertinent  positives and negatives per HPI.  Past Medical History:  Diagnosis Date  . Anemia   . Anxiety   . Depression 2010  . Diabetes mellitus   . Endometriosis   . Headache, common migraine, intractable   . History of ovarian cyst    left sided, caused by her endometriosis (possibly an endometrioma), in her 72s.  Marland Kitchen Hyperlipidemia   . Hypertension   . Kidney stones    left  . Morbid obesity ([place])    s/p gastric bypass  . Personal hx of gastric bypass 2010  . Rosacea   . Vertigo     Past Surgical History:  Procedure Laterality Date  . DILATION AND CURETTAGE OF UTERUS  2011  . gastric bypass    . OVARIAN CYST REMOVAL    . ROTATOR CUFF REPAIR     right    Family History  Problem Relation Age of Onset  . Diabetes Mother   . Hypertension Mother   . Hypertension Father   . Diabetes Father   . Depression Other        strong hx of  . Breast cancer Paternal Grandmother 78  . Cancer Neg Hx     SOCIAL HX:  reports that she quit smoking about 23 years ago. Her smoking use included cigarettes. She has a 4.00 pack-year smoking history. She has never used smokeless tobacco. She reports that she does not drink alcohol or use drugs.   Current Outpatient Medications:  .  acetaminophen (TYLENOL) 325 MG  tablet, Take 650 mg by mouth every 6 (six) hours as needed for mild pain. , Disp: , Rfl:  .  albuterol (VENTOLIN HFA) 108 (90 Base) MCG/ACT inhaler, Inhale 2 puffs into the lungs every 6 (six) hours as needed for wheezing or shortness of breath., Disp: 18 g, Rfl: 0 .  aspirin-acetaminophen-caffeine (EXCEDRIN MIGRAINE) 250-250-65 MG tablet, Take 2 tablets by mouth every 6 (six) hours as needed for headache., Disp: , Rfl:  .  atorvastatin (LIPITOR) 20 MG tablet, TAKE 1 TABLET BY MOUTH EVERY DAY (Patient taking differently: Take 20 mg by mouth daily at 6 PM. ), Disp: 90 tablet, Rfl: 0 .  azelastine (ASTELIN) 0.1 % nasal spray, INSTILL 1 2 SPRAYS IN EACH NOSTRIL TWICE A DAY, Disp: , Rfl:  .   blood glucose meter kit and supplies KIT, Dispense based on patient and insurance preference. Use up to four times daily as directed. (FOR ICD-E11.21), Disp: 1 each, Rfl: 0 .  buPROPion (WELLBUTRIN XL) 150 MG 24 hr tablet, Take 450 mg by mouth daily. , Disp: , Rfl: 3 .  Continuous Blood Gluc Receiver (FREESTYLE LIBRE 14 DAY READER) DEVI, 1 applicator by Does not apply route 4 (four) times daily., Disp: 1 Device, Rfl: 1 .  Continuous Blood Gluc Sensor (FREESTYLE LIBRE 14 DAY SENSOR) MISC, Inject 1 application into the skin 4 (four) times daily as needed (uncontrolled diabetes)., Disp: 2 each, Rfl: 11 .  cyanocobalamin (,VITAMIN B-12,) 1000 MCG/ML injection, INJECT 1 ML (1,000 MCG TOTAL) INTO THE MUSCLE ONCE A WEEK. FOR 4 WEEKS, THEN MONTHLY THEREAFTER, Disp: 6 mL, Rfl: 4 .  cyclobenzaprine (FLEXERIL) 10 MG tablet, Take 5-10 mg by mouth 3 (three) times daily as needed for muscle spasms. , Disp: , Rfl: 12 .  diltiazem (CARDIZEM CD) 240 MG 24 hr capsule, TAKE 1 CAPSULE BY MOUTH EVERY DAY, Disp: 90 capsule, Rfl: 0 .  doxycycline (VIBRA-TABS) 100 MG tablet, Take 1 tablet (100 mg total) by mouth 2 (two) times daily., Disp: 20 tablet, Rfl: 0 .  EPINEPHrine 0.3 mg/0.3 mL IJ SOAJ injection, Inject 0.3 mg into the muscle as needed for anaphylaxis. , Disp: , Rfl:  .  escitalopram (LEXAPRO) 20 MG tablet, Take 20 mg by mouth daily. , Disp: , Rfl:  .  fluticasone (FLONASE) 50 MCG/ACT nasal spray, SPRAY 2 SPRAYS INTO EACH NOSTRIL EVERY DAY, Disp: 48 mL, Rfl: 1 .  gentamicin cream (GARAMYCIN) 0.1 %, Apply 1 application topically 2 (two) times daily., Disp: 15 g, Rfl: 1 .  HORIZANT 600 MG TBCR, Take 1 tablet by mouth 2 (two) times daily., Disp: , Rfl:  .  INS SYRINGE/NEEDLE 1CC/28G (B-D INS SYR MICROFINE 1CC/28G) 28G X 1/2" 1 ML MISC, USE 1 SYRINGE DAILY AFTER SUPPER, Disp: 100 each, Rfl: 3 .  insulin aspart (NOVOLOG FLEXPEN) 100 UNIT/ML FlexPen, Inject 15 units + sliding scale under the skin with meals. Add 3-5 units  for every blood sugar 50 mg/dL over 150. Max dose of 30 units with meals., Disp: 30 mL, Rfl: 2 .  Insulin Glargine (BASAGLAR KWIKPEN) 100 UNIT/ML SOPN, Inject 0.5 mLs (50 Units total) into the skin daily., Disp: 45 mL, Rfl: 3 .  Insulin Pen Needle (BD PEN NEEDLE NANO U/F) 32G X 4 MM MISC, Please specify directions, refills and quantity, Disp: 100 each, Rfl: 11 .  lamoTRIgine (LAMICTAL) 150 MG tablet, Take 150 mg by mouth daily., Disp: , Rfl:  .  Lancet Device MISC, Test blood sugar two times daily, Disp: 100  each, Rfl: 6 .  loperamide (IMODIUM) 2 MG capsule, Take 2 capsules (4 mg total) by mouth as needed for diarrhea or loose stools., Disp: 30 capsule, Rfl: 0 .  LORazepam (ATIVAN) 0.5 MG tablet, Take 0.5-1 mg by mouth daily as needed. , Disp: , Rfl:  .  meclizine (ANTIVERT) 25 MG tablet, Take 1 tablet (25 mg total) by mouth 3 (three) times daily as needed for dizziness., Disp: 90 tablet, Rfl: 6 .  NURTEC 75 MG TBDP, Take 1 tablet by mouth daily as needed., Disp: , Rfl:  .  nystatin (MYCOSTATIN/NYSTOP) powder, APPLY 1 APPLICATION TO AFFECTED AREA TWICE A DAY, Disp: 60 g, Rfl: 0 .  OnabotulinumtoxinA (BOTOX IJ), Inject as directed every 3 (three) months. For migraine, Disp: , Rfl:  .  oxyCODONE-acetaminophen (PERCOCET) 10-325 MG tablet, Take 1 tablet by mouth every 6 (six) hours as needed for pain., Disp: , Rfl:  .  pregabalin (LYRICA) 50 MG capsule, Take 50-300 mg by mouth See admin instructions. Taking 1 ('50mg'$ ) capsule during the day if needed and 6 capsules ('300mg'$ ) at bedtime, Disp: , Rfl:  .  SUMAtriptan (IMITREX) 100 MG tablet, Take 100 mg by mouth every 2 (two) hours as needed for migraine. May repeat in 2 hours if headache persists or recurs., Disp: , Rfl:  .  SYRINGE-NEEDLE, DISP, 3 ML (BD INTEGRA SYRINGE) 25G X 1" 3 ML MISC, FOR USE WITH B12 INJECTIONS WEEKLY/MONTHLY, Disp: 25 each, Rfl: 0 .  Vitamin D, Ergocalciferol, (DRISDOL) 1.25 MG (50000 UT) CAPS capsule, TAKE 1 CAPSULE BY MOUTH ONE TIME  PER WEEK, Disp: 12 capsule, Rfl: 1 .  traMADol (ULTRAM) 50 MG tablet, TAKE ONE TABLET BY MOUTH EVERY 6 HOURS AS NEEDED FOR RELIEF FROM PAIN, Disp: 90 tablet, Rfl: 1  EXAM:  VITALS per patient if applicable:  GENERAL: alert, oriented, appears well and in no acute distress  HEENT: atraumatic, conjunttiva clear, no obvious abnormalities on inspection of external nose and ears  NECK: normal movements of the head and neck  LUNGS: on inspection no signs of respiratory distress, breathing rate appears normal, no obvious gross SOB, gasping or wheezing  CV: no obvious cyanosis  MS: moves all visible extremities without noticeable abnormality  PSYCH/NEURO: pleasant and cooperative, no obvious depression or anxiety, speech and thought processing grossly intact  ASSESSMENT AND PLAN:  Discussed the following assessment and plan:  Fatigue due to exposure, subsequent encounter - Plan: Thyroid Panel With TSH, CBC with Differential/Platelet  Uncontrolled type 2 diabetes mellitus with hypoglycemia without coma ([place]) - Plan: Hemoglobin A1c  Postviral fatigue syndrome  Recurrent headache  Postviral fatigue syndrome She continues to report excessive fatigue that is limiting her ability to return to her work full time.  I  have asked her to come in for lab work to rule out thyroiditis,  Anemia and cardiomyopathy  Recurrent headache She is averaging 2-3 per week.  She has resumed Botox injections but continues to have headaches.  Will increase diltiazem to 240 mg daily  Diabetes mellitus type 2, uncontrolled ([place]) She has improved control with an increase in her basal insulin to 35 units and reduced her evening dose of humalog pre dinner with a reduction in the number of hypoglycemic events.     I discussed the assessment and treatment plan with the patient. The patient was provided an opportunity to ask questions and all were answered. The patient agreed with the plan and demonstrated an  understanding of the instructions.   The patient  was advised to call back or seek an in-person evaluation if the symptoms worsen or if the condition fails to improve as anticipated.  I provided 30 minutes of non-face-to-face time during this encounter reviewing patient's current problems and past procedures/imaging studies, providing counseling on the above mentioned problems , and coordination  of care .  [name], MD

## 2020-01-13 NOTE — Assessment & Plan Note (Signed)
She has improved control with an increase in her basal insulin to 35 units and reduced her evening dose of humalog pre dinner with a reduction in the number of hypoglycemic events.

## 2020-01-13 NOTE — Assessment & Plan Note (Addendum)
She continues to report excessive fatigue that is limiting her ability to return to her work full time.  I  have asked her to come in for lab work to rule out thyroiditis,  Anemia and cardiomyopathy

## 2020-01-13 NOTE — Assessment & Plan Note (Signed)
She is averaging 2-3 per week.  She has resumed Botox injections but continues to have headaches.  Will increase diltiazem to 240 mg daily

## 2020-01-13 NOTE — Progress Notes (Signed)
Subjective: Patient presents today for evaluation of intermittent throbbing pain to the lateral border of the left great toe that has been ongoing for the past 1-2 years. She reports associated redness of the area. Patient is concerned for possible ingrown nail. She has not done anything for treatment and denies any modifying factors.  She also complains of a blister noted to the left lateral ankle that appeared a few weeks ago. She has not done anything for treatment and denies modifying factors.  She is also here for follow up evaluation of a painful callus lesion noted to the right 2nd toe. She reports continued pain that is worsened with walking in shoes. She has not had any recent treatment for this complaint. Patient presents today for further treatment and evaluation.   Past Medical History:  Diagnosis Date  . Anemia   . Anxiety   . Depression 2010  . Diabetes mellitus   . Endometriosis   . Headache, common migraine, intractable   . History of ovarian cyst    left sided, caused by her endometriosis (possibly an endometrioma), in her 14s.  Marland Kitchen Hyperlipidemia   . Hypertension   . Kidney stones    left  . Morbid obesity ([place])    s/p gastric bypass  . Personal hx of gastric bypass 2010  . Rosacea   . Vertigo     Objective:  General: Well developed, nourished, in no acute distress, alert and oriented x3   Dermatology: Skin is warm, dry and supple bilateral. Lateral border of the left great toe appears to be erythematous with evidence of an ingrowing nail. Pain on palpation noted to the border of the nail fold. Hyperkeratotic lesion(s) present on the distal tuft of the right 2nd toe. Pain on palpation with a central nucleated core noted.   Wound #1 noted to the lateral left ankle measuring approximately 0.5 x 0.5 x 0.1 cm.   To the above-noted ulceration, there is no eschar. There is a moderate amount of slough, fibrin and necrotic tissue. Granulation tissue and wound base is  red. There is no malodor. There is a minimal amount of serosanginous drainage noted. Periwound integrity is intact.   Vascular: Dorsalis Pedis artery and Posterior Tibial artery pedal pulses palpable. No lower extremity edema noted.   Neruologic: Grossly intact via light touch bilateral.  Musculoskeletal: Muscular strength within normal limits in all groups bilateral. Normal range of motion noted to all pedal and ankle joints. Hammertoe contracture deformity noted to the 2nd digit of the right foot.   Assesement: #1 Paronychia with ingrowing nail lateral border left great toe #2 Incurvated nail #3 Ulceration of the lateral left ankle secondary to diabetes mellitus  #4 Pre-ulcerative callus lesion noted to the right 2nd toe distal tuft  #5 Reducible hammertoe right 2nd toe   Plan of Care:  1. Patient evaluated.  2. Discussed treatment alternatives and plan of care. Explained nail avulsion procedure and post procedure course to patient. 3. Patient opted for permanent partial nail avulsion of the lateral border left great toe.  4. Prior to procedure, local anesthesia infiltration utilized using 3 ml of a 50:50 mixture of 2% plain lidocaine and 0.5% plain marcaine in a normal hallux block fashion and a betadine prep performed.  5. Partial permanent nail avulsion with chemical matrixectomy performed using [id number] applications of phenol followed by alcohol flush.  6. Light dressing applied. 7. Medically necessary excisional debridement including subcutaneous tissue was performed using a tissue nipper and a  chisel blade. Excisional debridement of all the necrotic nonviable tissue down to healthy bleeding viable tissue was performed with post-debridement measurements same as pre-. 8. The wound was cleansed and dry sterile dressing applied. 9. Excisional debridement of keratotic lesion(s) using a chisel blade was performed without incident. Light dressing applied.  10. Prescription for Gentamicin  cream provided to patient to use daily with a bandage.  11. Prescription for Doxycycline 100 mg #20 provided to patient.  12. Return to clinic in 2 weeks for flexor tenotomy of the right 2nd toe.  Edrick Kins, DPM [facility]  Dr. Edrick Kins, [place]                                        [place], [place] [address]                Office [phone]  Fax [phone]

## 2020-01-23 ENCOUNTER — Other Ambulatory Visit (INDEPENDENT_AMBULATORY_CARE_PROVIDER_SITE_OTHER): Payer: BC Managed Care – PPO

## 2020-01-23 ENCOUNTER — Encounter: Payer: Self-pay | Admitting: Podiatry

## 2020-01-23 ENCOUNTER — Ambulatory Visit: Payer: BC Managed Care – PPO | Admitting: Podiatry

## 2020-01-23 ENCOUNTER — Other Ambulatory Visit: Payer: Self-pay

## 2020-01-23 DIAGNOSIS — E11649 Type 2 diabetes mellitus with hypoglycemia without coma: Secondary | ICD-10-CM | POA: Diagnosis not present

## 2020-01-23 DIAGNOSIS — E0843 Diabetes mellitus due to underlying condition with diabetic autonomic (poly)neuropathy: Secondary | ICD-10-CM

## 2020-01-23 DIAGNOSIS — M2041 Other hammer toe(s) (acquired), right foot: Secondary | ICD-10-CM

## 2020-01-23 DIAGNOSIS — E1165 Type 2 diabetes mellitus with hyperglycemia: Secondary | ICD-10-CM | POA: Diagnosis not present

## 2020-01-23 DIAGNOSIS — T732XXD Exhaustion due to exposure, subsequent encounter: Secondary | ICD-10-CM

## 2020-01-23 DIAGNOSIS — L600 Ingrowing nail: Secondary | ICD-10-CM

## 2020-01-23 DIAGNOSIS — L97322 Non-pressure chronic ulcer of left ankle with fat layer exposed: Secondary | ICD-10-CM | POA: Diagnosis not present

## 2020-01-23 LAB — COMPREHENSIVE METABOLIC PANEL
ALT: 24 U/L (ref 0–35)
AST: 14 U/L (ref 0–37)
Albumin: 3.3 g/dL — ABNORMAL LOW (ref 3.5–5.2)
Alkaline Phosphatase: 110 U/L (ref 39–117)
BUN: 23 mg/dL (ref 6–23)
CO2: 29 mEq/L (ref 19–32)
Calcium: 8.9 mg/dL (ref 8.4–10.5)
Chloride: 106 mEq/L (ref 96–112)
Creatinine, Ser: 0.98 mg/dL (ref 0.40–1.20)
GFR: 60.19 mL/min (ref >60.00)
Glucose, Bld: 88 mg/dL (ref 70–99)
Potassium: 4.1 mEq/L (ref 3.5–5.1)
Sodium: 143 mEq/L (ref 135–145)
Total Bilirubin: 0.2 mg/dL (ref 0.2–1.2)
Total Protein: 5.9 g/dL — ABNORMAL LOW (ref 6.0–8.3)

## 2020-01-23 LAB — CBC WITH DIFFERENTIAL/PLATELET
Basophils Absolute: 0.1 10*3/uL (ref 0.0–0.1)
Basophils Relative: 1 % (ref 0.0–3.0)
Eosinophils Absolute: 0.2 10*3/uL (ref 0.0–0.7)
Eosinophils Relative: 2.1 % (ref 0.0–5.0)
HCT: 40.2 % (ref 36.0–46.0)
Hemoglobin: 12.9 g/dL (ref 12.0–15.0)
Lymphocytes Relative: 32.2 % (ref 12.0–46.0)
Lymphs Abs: 3.3 10*3/uL (ref 0.7–4.0)
MCHC: 32 g/dL (ref 30.0–36.0)
MCV: 93.1 fl (ref 78.0–100.0)
Monocytes Absolute: 0.5 10*3/uL (ref 0.1–1.0)
Monocytes Relative: 4.8 % (ref 3.0–12.0)
Neutro Abs: 6.1 10*3/uL (ref 1.4–7.7)
Neutrophils Relative %: 59.9 % (ref 43.0–77.0)
Platelets: 398 10*3/uL (ref 150.0–400.0)
RBC: 4.32 Mil/uL (ref 3.87–5.11)
RDW: 15.3 % (ref 11.5–15.5)
WBC: 10.2 10*3/uL (ref 4.0–10.5)

## 2020-01-23 LAB — HEMOGLOBIN A1C: Hgb A1c MFr Bld: 8.1 % — ABNORMAL HIGH (ref 4.6–6.5)

## 2020-01-23 NOTE — Addendum Note (Signed)
Addended by: Leeanne Rio on: 01/23/2020 09:04 AM   Modules accepted: Orders

## 2020-01-24 LAB — THYROID PANEL WITH TSH
Free Thyroxine Index: 1.4 (ref 1.4–3.8)
T3 Uptake: 31 % (ref 22–35)
T4, Total: 4.5 ug/dL — ABNORMAL LOW (ref 5.1–11.9)
TSH: 6.62 mIU/L — ABNORMAL HIGH

## 2020-01-26 ENCOUNTER — Ambulatory Visit (INDEPENDENT_AMBULATORY_CARE_PROVIDER_SITE_OTHER): Payer: BC Managed Care – PPO | Admitting: Internal Medicine

## 2020-01-26 ENCOUNTER — Other Ambulatory Visit: Payer: Self-pay | Admitting: Internal Medicine

## 2020-01-26 ENCOUNTER — Encounter: Payer: Self-pay | Admitting: Internal Medicine

## 2020-01-26 ENCOUNTER — Other Ambulatory Visit: Payer: Self-pay

## 2020-01-26 DIAGNOSIS — L03032 Cellulitis of left toe: Secondary | ICD-10-CM

## 2020-01-26 DIAGNOSIS — G933 Postviral fatigue syndrome: Secondary | ICD-10-CM

## 2020-01-26 DIAGNOSIS — D508 Other iron deficiency anemias: Secondary | ICD-10-CM | POA: Diagnosis not present

## 2020-01-26 DIAGNOSIS — E1122 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 disease: Secondary | ICD-10-CM | POA: Insufficient documentation

## 2020-01-26 DIAGNOSIS — G9331 Postviral fatigue syndrome: Secondary | ICD-10-CM

## 2020-01-26 DIAGNOSIS — E119 Type 2 diabetes mellitus without complications: Secondary | ICD-10-CM | POA: Insufficient documentation

## 2020-01-26 DIAGNOSIS — U071 COVID-19: Secondary | ICD-10-CM

## 2020-01-26 DIAGNOSIS — E11649 Type 2 diabetes mellitus with hypoglycemia without coma: Secondary | ICD-10-CM

## 2020-01-26 DIAGNOSIS — Z794 Long term (current) use of insulin: Secondary | ICD-10-CM

## 2020-01-26 DIAGNOSIS — E039 Hypothyroidism, unspecified: Secondary | ICD-10-CM

## 2020-01-26 DIAGNOSIS — E033 Postinfectious hypothyroidism: Secondary | ICD-10-CM

## 2020-01-26 MED ORDER — LEVOTHYROXINE SODIUM 50 MCG PO TABS: 50.0000 ug | ORAL_TABLET | Freq: Every day | ORAL | 3 refills | Status: DC

## 2020-01-26 NOTE — Assessment & Plan Note (Signed)
May be related to thyroid hypofuncion caused by covid 19   Lab Results  Component Value Date   TSH 6.62 (H) 01/23/2020

## 2020-01-26 NOTE — Progress Notes (Signed)
Virtual Visit via [email or web address]  This visit type was conducted due to national recommendations for restrictions regarding the COVID-19 pandemic (e.g. social distancing).  This format is felt to be most appropriate for this patient at this time.  All issues noted in this document were discussed and addressed.  No physical exam was performed (except for noted visual exam findings with Video Visits).   I connected with@ on 01/26/20 at  8:30 AM EST by a video enabled telemedicine application  and verified that I am speaking with the correct person using two identifiers. Location patient: home Location provider: work or home office Persons participating in the virtual visit: patient, provider  I discussed the limitations, risks, security and privacy concerns of performing an evaluation and management service by telephone and the availability of in person appointments. I also discussed with the patient that there may be a patient responsible charge related to this service. The patient expressed understanding and agreed to proceed.  Reason for visit: FOLLOW UP ON POST COVID FATIGUE   HPI:  49 yr old female with uncontrolled Type 2 DM  history of hospitalization for COVID 33 INFECTION IN October , has been unable to return to work F/T due  To excessive fatigue.  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  Having hypoglycemic events 3 hours later if she increases her novolog any more,  Has increased basaglar to 40 units  ROS: See pertinent positives and negatives per HPI.  Past Medical History:  Diagnosis Date  . Anemia   . Anxiety   . Depression 2010  . Diabetes mellitus   . DKA, type 2 ([place]) 10/13/2019  . Endometriosis   . Headache, common migraine,  intractable   . History of ovarian cyst    left sided, caused by her endometriosis (possibly an endometrioma), in her 87s.  Marland Kitchen Hyperlipidemia   . Hypertension   . Kidney stones    left  . Morbid obesity ([place])    s/p gastric bypass  . Personal hx of gastric bypass 2010  . Rosacea   . Vertigo     Past Surgical History:  Procedure Laterality Date  . DILATION AND CURETTAGE OF UTERUS  2011  . gastric bypass    . OVARIAN CYST REMOVAL    . ROTATOR CUFF REPAIR     right    Family History  Problem Relation Age of Onset  . Diabetes Mother   . Hypertension Mother   . Hypertension Father   . Diabetes Father   . Depression Other        strong hx of  . Breast cancer Paternal Grandmother 3  . 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 Her smoking use included cigarettes. She has a 4.00 pack-year smoking history. She has never used smokeless tobacco. She reports that she does not drink alcohol or use drugs.   Current Outpatient Medications:  .  acetaminophen (TYLENOL) 325 MG tablet, Take 650 mg by mouth every 6 (six) hours as needed for mild pain. , Disp: , Rfl:  .  albuterol (VENTOLIN HFA) 108 (90 Base) MCG/ACT inhaler, Inhale 2 puffs into the  lungs every 6 (six) hours as needed for wheezing or shortness of breath., Disp: 18 g, Rfl: 0 .  aspirin-acetaminophen-caffeine (EXCEDRIN MIGRAINE) 250-250-65 MG tablet, Take 2 tablets by mouth every 6 (six) hours as needed for headache., Disp: , Rfl:  .  atorvastatin (LIPITOR) 20 MG tablet, TAKE 1 TABLET BY MOUTH EVERY DAY (Patient taking differently: Take 20 mg by mouth daily at 6 PM. ), Disp: 90 tablet, Rfl: 0 .  azelastine (ASTELIN) 0.1 % nasal spray, INSTILL 1 2 SPRAYS IN EACH NOSTRIL TWICE A DAY, Disp: , Rfl:  .  blood glucose meter kit and supplies KIT, Dispense based on patient and insurance preference. Use up to four times daily as directed. (FOR  ICD-E11.21), Disp: 1 each, Rfl: 0 .  buPROPion (WELLBUTRIN XL) 150 MG 24 hr tablet, Take 450 mg by mouth daily. , Disp: , Rfl: 3 .  Continuous Blood Gluc Receiver (FREESTYLE LIBRE 14 DAY READER) DEVI, 1 applicator by Does not apply route 4 (four) times daily., Disp: 1 Device, Rfl: 1 .  Continuous Blood Gluc Sensor (FREESTYLE LIBRE 14 DAY SENSOR) MISC, Inject 1 application into the skin 4 (four) times daily as needed (uncontrolled diabetes)., Disp: 2 each, Rfl: 11 .  cyanocobalamin (,VITAMIN B-12,) 1000 MCG/ML injection, INJECT 1 ML (1,000 MCG TOTAL) INTO THE MUSCLE ONCE A WEEK. FOR 4 WEEKS, THEN MONTHLY THEREAFTER, Disp: 6 mL, Rfl: 4 .  cyclobenzaprine (FLEXERIL) 10 MG tablet, Take 5-10 mg by mouth 3 (three) times daily as needed for muscle spasms. , Disp: , Rfl: 12 .  diltiazem (CARDIZEM CD) 240 MG 24 hr capsule, TAKE 1 CAPSULE BY MOUTH EVERY DAY, Disp: 90 capsule, Rfl: 0 .  EPINEPHrine 0.3 mg/0.3 mL IJ SOAJ injection, Inject 0.3 mg into the muscle as needed for anaphylaxis. , Disp: , Rfl:  .  escitalopram (LEXAPRO) 20 MG tablet, Take 20 mg by mouth daily. , Disp: , Rfl:  .  fluticasone (FLONASE) 50 MCG/ACT nasal spray, SPRAY 2 SPRAYS INTO EACH NOSTRIL EVERY DAY, Disp: 48 mL, Rfl: 1 .  gentamicin cream (GARAMYCIN) 0.1 %, Apply 1 application topically 2 (two) times daily., Disp: 15 g, Rfl: 1 .  HORIZANT 600 MG TBCR, Take 1 tablet by mouth 2 (two) times daily., Disp: , Rfl:  .  INS SYRINGE/NEEDLE 1CC/28G (B-D INS SYR MICROFINE 1CC/28G) 28G X 1/2" 1 ML MISC, USE 1 SYRINGE DAILY AFTER SUPPER, Disp: 100 each, Rfl: 3 .  insulin aspart (NOVOLOG FLEXPEN) 100 UNIT/ML FlexPen, Inject 15 units + sliding scale under the skin with meals. Add 3-5 units for every blood sugar 50 mg/dL over 150. Max dose of 30 units with meals., Disp: 30 mL, Rfl: 2 .  Insulin Glargine (BASAGLAR KWIKPEN) 100 UNIT/ML SOPN, Inject 0.5 mLs (50 Units total) into the skin daily., Disp: 45 mL, Rfl: 3 .  Insulin Pen Needle (BD PEN NEEDLE  NANO U/F) 32G X 4 MM MISC, Please specify directions, refills and quantity, Disp: 100 each, Rfl: 11 .  lamoTRIgine (LAMICTAL) 150 MG tablet, Take 150 mg by mouth daily., Disp: , Rfl:  .  Lancet Device MISC, Test blood sugar two times daily, Disp: 100 each, Rfl: 6 .  levothyroxine (SYNTHROID) 50 MCG tablet, Take 1 tablet (50 mcg total) by mouth daily., Disp: 90 tablet, Rfl: 3 .  loperamide (IMODIUM) 2 MG capsule, Take 2 capsules (4 mg total) by mouth as needed for diarrhea or loose stools., Disp: 30 capsule, Rfl: 0 .  LORazepam (ATIVAN) 0.5 MG tablet, Take  0.5-1 mg by mouth daily as needed. , Disp: , Rfl:  .  meclizine (ANTIVERT) 25 MG tablet, Take 1 tablet (25 mg total) by mouth 3 (three) times daily as needed for dizziness., Disp: 90 tablet, Rfl: 6 .  NURTEC 75 MG TBDP, Take 1 tablet by mouth daily as needed., Disp: , Rfl:  .  nystatin (MYCOSTATIN/NYSTOP) powder, APPLY 1 APPLICATION TO AFFECTED AREA TWICE A DAY, Disp: 60 g, Rfl: 0 .  OnabotulinumtoxinA (BOTOX IJ), Inject as directed every 3 (three) months. For migraine, Disp: , Rfl:  .  oxyCODONE-acetaminophen (PERCOCET) 10-325 MG tablet, Take 1 tablet by mouth every 6 (six) hours as needed for pain., Disp: , Rfl:  .  pregabalin (LYRICA) 50 MG capsule, Take 50-300 mg by mouth See admin instructions. Taking 1 (36m) capsule during the day if needed and 6 capsules (3069m at bedtime, Disp: , Rfl:  .  SUMAtriptan (IMITREX) 100 MG tablet, Take 100 mg by mouth every 2 (two) hours as needed for migraine. May repeat in 2 hours if headache persists or recurs., Disp: , Rfl:  .  SYRINGE-NEEDLE, DISP, 3 ML (BD INTEGRA SYRINGE) 25G X 1" 3 ML MISC, FOR USE WITH B12 INJECTIONS WEEKLY/MONTHLY, Disp: 25 each, Rfl: 0 .  traMADol (ULTRAM) 50 MG tablet, TAKE ONE TABLET BY MOUTH EVERY 6 HOURS AS NEEDED FOR RELIEF FROM PAIN, Disp: 90 tablet, Rfl: 1 .  Vitamin D, Ergocalciferol, (DRISDOL) 1.25 MG (50000 UT) CAPS capsule, TAKE 1 CAPSULE BY MOUTH ONE TIME PER WEEK, Disp: 12  capsule, Rfl: 1  EXAM:  VITALS per patient if applicable:  GENERAL: alert, oriented, appears well and in no acute distress  HEENT: atraumatic, conjunttiva clear, no obvious abnormalities on inspection of external nose and ears  NECK: normal movements of the head and neck  LUNGS: on inspection no signs of respiratory distress, breathing rate appears normal, no obvious gross SOB, gasping or wheezing  CV: no obvious cyanosis  MS: moves all visible extremities without noticeable abnormality  PSYCH/NEURO: pleasant and cooperative, no obvious depression or anxiety, speech and thought processing grossly intact  ASSESSMENT AND PLAN:  Discussed the following assessment and plan:  COVID-19 virus infection  Paronychia of great toe of left foot  Other iron deficiency anemia  Postviral fatigue syndrome  Postinfectious hypothyroidism  Type 2 diabetes mellitus with hypoglycemia without coma, with long-term current use of insulin (HCOliver COVID-19 virus infection She continues to report excessive fatigue that is limiting her ability to return to her work full time. Labs suggest thyroid hypofunction   Paronychia of great toe of left foot Secondary to ingrown toenail.  Seen by podiatry   Iron deficiency anemia Lab Results  Component Value Date   WBC 10.2 01/23/2020   HGB 12.9 01/23/2020   HCT 40.2 01/23/2020   MCV 93.1 01/23/2020   PLT 398.0 01/23/2020   Resolved  Postviral fatigue syndrome May be related to thyroid hypofuncion caused by covid 19   Lab Results  Component Value Date   TSH 6.62 (H) 01/23/2020     Hypothyroid Starting levothyroxine 50 mcg daily.  rtc 6 weeks for additional labs and recheck  tsh   Insulin use (long-term) in type 2 diabetes ([place]view[place]proved but not at goal.  Higher doses of novolog result in hypoglycemia Advised to increase basaglar to 40 units.  increase every 3 days by 3 units  Lab Results  Component Value Date   HGBA1C 8.1 (H) 01/23/2020        I  discussed the assessment and treatment plan with the patient. The patient was provided an opportunity to ask questions and all were answered. The patient agreed with the plan and demonstrated an understanding of the instructions.   The patient was advised to call back or seek an in-person evaluation if the symptoms worsen or if the condition fails to improve as anticipated.  I provided 30 minutes of non-face-to-face time during this encounter reviewing patient's current problems and post surgeries.  Providing counseling on the above mentioned problems , and coordination  of care .   [name], MD

## 2020-01-26 NOTE — Assessment & Plan Note (Signed)
Improved but not at goal.  Higher doses of novolog result in hypoglycemia Advised to increase basaglar to 40 units.  increase every 3 days by 3 units  Lab Results  Component Value Date   HGBA1C 8.1 (H) 01/23/2020

## 2020-01-26 NOTE — Assessment & Plan Note (Signed)
Secondary to ingrown toenail.  Seen by podiatry

## 2020-01-26 NOTE — Assessment & Plan Note (Signed)
She continues to report excessive fatigue that is limiting her ability to return to her work full time. Labs suggest thyroid hypofunction

## 2020-01-26 NOTE — Assessment & Plan Note (Addendum)
Lab Results  Component Value Date   WBC 10.2 01/23/2020   HGB 12.9 01/23/2020   HCT 40.2 01/23/2020   MCV 93.1 01/23/2020   PLT 398.0 01/23/2020   Resolved

## 2020-01-26 NOTE — Assessment & Plan Note (Signed)
Starting levothyroxine 50 mcg daily.  rtc 6 weeks for additional labs and recheck  tsh

## 2020-01-30 NOTE — Progress Notes (Signed)
Subjective: Patient presents today for follow-up treatment evaluation regarding an ingrown toenail of the left great toe.  She states she is doing well.  She continues to have an ulcer to the lateral aspect of the left ankle with some modest improvement.  She presents today to discuss alleviating the pressure from the distal tuft of the right second toe with a flexor tenotomy as discussed on last visit.  She presents for further treatment evaluation   Past Medical History:  Diagnosis Date  . Anemia   . Anxiety   . Depression 2010  . Diabetes mellitus   . DKA, type 2 ([place]) 10/13/2019  . Endometriosis   . Headache, common migraine, intractable   . History of ovarian cyst    left sided, caused by her endometriosis (possibly an endometrioma), in her 83s.  Marland Kitchen Hyperlipidemia   . Hypertension   . Kidney stones    left  . Morbid obesity ([place])    s/p gastric bypass  . Personal hx of gastric bypass 2010  . Rosacea   . Vertigo     Objective:  General: Well developed, nourished, in no acute distress, alert and oriented x3   Dermatology: Skin is warm, dry and supple bilateral. Lateral border of the left great toe appears significantly improved with minimal drainage from the avulsion site. Hyperkeratotic lesion(s) present on the distal tuft of the right 2nd toe. Pain on palpation with a central nucleated core noted.   Wound #1 noted to the lateral left ankle measuring approximately 0.4 x 0.4 x 0.1 cm.   To the above-noted ulceration, there is no eschar. There is a moderate amount of slough, fibrin and necrotic tissue. Granulation tissue and wound base is red. There is no malodor. There is a minimal amount of serosanginous drainage noted. Periwound integrity is intact.   Vascular: Dorsalis Pedis artery and Posterior Tibial artery pedal pulses palpable. No lower extremity edema noted.   Neruologic: Grossly intact via light touch bilateral.  Musculoskeletal: Muscular strength within normal  limits in all groups bilateral. Normal range of motion noted to all pedal and ankle joints. Hammertoe contracture deformity noted to the 2nd digit of the right foot.   Assesement: #1  Follow-up ingrown toenail lateral border left great toe #3 Ulceration of the lateral left ankle secondary to diabetes mellitus  #4 Pre-ulcerative callus lesion noted to the right 2nd toe distal tuft  #5 Reducible hammertoe right 2nd toe   Plan of Care:  1. Patient evaluated.  2. Discussed treatment alternatives and plan of care. Explained flexor tenotomy procedure and post procedure course to patient. 3.  Patient agrees and opts for percutaneous flexor tenotomy of the right second toe 4.  Toe was prepped in aseptic manner and digital block was performed using 2% lidocaine plain.  A surgical #11 blade was utilized to perform a flexor tenotomy at the sulcus of the toe.  Immediately the toe was in a more dorsiflexed position.   6. Light dressing applied. 7. Medically necessary excisional debridement including subcutaneous tissue was performed using a tissue nipper and a chisel blade. Excisional debridement of all the necrotic nonviable tissue down to healthy bleeding viable tissue was performed with post-debridement measurements same as pre-. 8. The wound was cleansed and dry sterile dressing applied. 9.  Continue gentamicin cream daily  10.  Return to clinic in 1 week  Edrick Kins, DPM [facility]  Dr. Edrick Kins, DPM    [address]  [place], [place] [address]                Office [phone]  Fax [phone]

## 2020-02-09 ENCOUNTER — Encounter: Payer: Self-pay | Admitting: Internal Medicine

## 2020-02-09 ENCOUNTER — Other Ambulatory Visit: Payer: Self-pay

## 2020-02-09 ENCOUNTER — Telehealth (INDEPENDENT_AMBULATORY_CARE_PROVIDER_SITE_OTHER): Payer: BC Managed Care – PPO | Admitting: Internal Medicine

## 2020-02-09 DIAGNOSIS — E033 Postinfectious hypothyroidism: Secondary | ICD-10-CM | POA: Diagnosis not present

## 2020-02-09 DIAGNOSIS — M25511 Pain in right shoulder: Secondary | ICD-10-CM | POA: Diagnosis not present

## 2020-02-09 DIAGNOSIS — G933 Postviral fatigue syndrome: Secondary | ICD-10-CM | POA: Diagnosis not present

## 2020-02-09 DIAGNOSIS — G9331 Postviral fatigue syndrome: Secondary | ICD-10-CM

## 2020-02-09 DIAGNOSIS — G8929 Other chronic pain: Secondary | ICD-10-CM

## 2020-02-09 NOTE — Assessment & Plan Note (Signed)
Suspected rotator cuff injury per Orthopedics.  Continue use of tramadol tid  Prn pain   MRI ordered by Orthoepdics

## 2020-02-09 NOTE — Assessment & Plan Note (Addendum)
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

## 2020-02-09 NOTE — Assessment & Plan Note (Signed)
Improved in the last week after starting levothyroxine for TSH > 6

## 2020-02-09 NOTE — Progress Notes (Signed)
Virtual Visit via [place]  This visit type was conducted due to national recommendations for restrictions regarding the COVID-19 pandemic (e.g. social distancing).  This format is felt to be most appropriate for this patient at this time.  All issues noted in this document were discussed and addressed.  No physical exam was performed (except for noted visual exam findings with Video Visits).   I connected with@ on 02/09/20 at  8:30 AM EST by a video enabled telemedicine application  and verified that I am speaking with the correct person using two identifiers. Location patient: home Location provider: work or home office Persons participating in the virtual visit: patient, provider  I discussed the limitations, risks, security and privacy concerns of performing an evaluation and management service by telephone and the availability of in person appointments. I also discussed with the patient that there may be a patient responsible charge related to this service. The patient expressed understanding and agreed to proceed.  Reason for visit: follow up on post covid fatigue   HPI: 49 yr old female with uncontrolled Type 2 DM  history of hospitalization for COVID 40 INFECTION IN October , has been unable to return to work F/T due  To excessive fatigue.  Was started on thyroid medication 2 weeks ago for elevated TSH  Preceded by unintentional  weight wt loss and states that her energy level is better and she is feeling less foggy.   Lab Results  Component Value Date   TSH 6.62 (H) 01/23/2020   returned to work last Friday  For half day,  Feeling more energetic since starting thryoid and is ready to return to work full time.  Energy level and mentation better.   Right shoulder pain persistent after last fall . Has been seeing Reche Dixon and is getting mri shoulder by [facility] march 11 using tramadol for pain management  Up to 3 times daly   Back pain: saw [facility], Feb 16,  Had an ESI for sciatica.   Feels much bette.r    ROS: See pertinent positives and negatives per HPI.  Past Medical History:  Diagnosis Date  . Anemia   . Anxiety   . Depression 2010  . Diabetes mellitus   . DKA, type 2 ([place]) 10/13/2019  . Endometriosis   . Headache, common migraine, intractable   . History of ovarian cyst    left sided, caused by her endometriosis (possibly an endometrioma), in her 68s.  Marland Kitchen Hyperlipidemia   . Hypertension   . Kidney stones    left  . Morbid obesity ([place])    s/p gastric bypass  . Personal hx of gastric bypass 2010  . Rosacea   . Vertigo     Past Surgical History:  Procedure Laterality Date  . DILATION AND CURETTAGE OF UTERUS  2011  . gastric bypass    . OVARIAN CYST REMOVAL    . ROTATOR CUFF REPAIR     right    Family History  Problem Relation Age of Onset  . Diabetes Mother   . Hypertension Mother   . Hypertension Father   . Diabetes Father   . Depression Other        strong hx of  . Breast cancer Paternal Grandmother 13  . Cancer Neg Hx     SOCIAL HX:  reports that she quit smoking about 24 years ago. Her smoking use included cigarettes. She has a 4.00 pack-year smoking history. She has never used smokeless tobacco. She reports that she  does not drink alcohol or use drugs.   Current Outpatient Medications:  .  acetaminophen (TYLENOL) 325 MG tablet, Take 650 mg by mouth every 6 (six) hours as needed for mild pain. , Disp: , Rfl:  .  albuterol (VENTOLIN HFA) 108 (90 Base) MCG/ACT inhaler, Inhale 2 puffs into the lungs every 6 (six) hours as needed for wheezing or shortness of breath., Disp: 18 g, Rfl: 0 .  aspirin-acetaminophen-caffeine (EXCEDRIN MIGRAINE) 250-250-65 MG tablet, Take 2 tablets by mouth every 6 (six) hours as needed for headache., Disp: , Rfl:  .  atorvastatin (LIPITOR) 20 MG tablet, TAKE 1 TABLET BY MOUTH EVERY DAY (Patient taking differently: Take 20 mg by mouth daily at 6 PM. ), Disp: 90 tablet, Rfl: 0 .  azelastine (ASTELIN) 0.1 % nasal  spray, INSTILL 1 2 SPRAYS IN EACH NOSTRIL TWICE A DAY, Disp: , Rfl:  .  blood glucose meter kit and supplies KIT, Dispense based on patient and insurance preference. Use up to four times daily as directed. (FOR ICD-E11.21), Disp: 1 each, Rfl: 0 .  buPROPion (WELLBUTRIN XL) 150 MG 24 hr tablet, Take 450 mg by mouth daily. , Disp: , Rfl: 3 .  Continuous Blood Gluc Receiver (FREESTYLE LIBRE 14 DAY READER) DEVI, 1 applicator by Does not apply route 4 (four) times daily., Disp: 1 Device, Rfl: 1 .  Continuous Blood Gluc Sensor (FREESTYLE LIBRE 14 DAY SENSOR) MISC, Inject 1 application into the skin 4 (four) times daily as needed (uncontrolled diabetes)., Disp: 2 each, Rfl: 11 .  cyanocobalamin (,VITAMIN B-12,) 1000 MCG/ML injection, INJECT 1 ML (1,000 MCG TOTAL) INTO THE MUSCLE ONCE A WEEK. FOR 4 WEEKS, THEN MONTHLY THEREAFTER, Disp: 6 mL, Rfl: 4 .  cyclobenzaprine (FLEXERIL) 10 MG tablet, Take 5-10 mg by mouth 3 (three) times daily as needed for muscle spasms. , Disp: , Rfl: 12 .  diltiazem (CARDIZEM CD) 240 MG 24 hr capsule, TAKE 1 CAPSULE BY MOUTH EVERY DAY, Disp: 90 capsule, Rfl: 0 .  EPINEPHrine 0.3 mg/0.3 mL IJ SOAJ injection, Inject 0.3 mg into the muscle as needed for anaphylaxis. , Disp: , Rfl:  .  escitalopram (LEXAPRO) 20 MG tablet, Take 20 mg by mouth daily. , Disp: , Rfl:  .  fluticasone (FLONASE) 50 MCG/ACT nasal spray, SPRAY 2 SPRAYS INTO EACH NOSTRIL EVERY DAY, Disp: 48 mL, Rfl: 1 .  gentamicin cream (GARAMYCIN) 0.1 %, Apply 1 application topically 2 (two) times daily., Disp: 15 g, Rfl: 1 .  HORIZANT 600 MG TBCR, Take 1 tablet by mouth 2 (two) times daily., Disp: , Rfl:  .  INS SYRINGE/NEEDLE 1CC/28G (B-D INS SYR MICROFINE 1CC/28G) 28G X 1/2" 1 ML MISC, USE 1 SYRINGE DAILY AFTER SUPPER, Disp: 100 each, Rfl: 3 .  insulin aspart (NOVOLOG FLEXPEN) 100 UNIT/ML FlexPen, Inject 15 units + sliding scale under the skin with meals. Add 3-5 units for every blood sugar 50 mg/dL over 150. Max dose of 30  units with meals., Disp: 30 mL, Rfl: 2 .  Insulin Glargine (BASAGLAR KWIKPEN) 100 UNIT/ML SOPN, Inject 0.5 mLs (50 Units total) into the skin daily., Disp: 45 mL, Rfl: 3 .  Insulin Pen Needle (BD PEN NEEDLE NANO U/F) 32G X 4 MM MISC, Please specify directions, refills and quantity, Disp: 100 each, Rfl: 11 .  lamoTRIgine (LAMICTAL) 150 MG tablet, Take 150 mg by mouth daily., Disp: , Rfl:  .  Lancet Device MISC, Test blood sugar two times daily, Disp: 100 each, Rfl: 6 .  levothyroxine (SYNTHROID) 50 MCG tablet, Take 1 tablet (50 mcg total) by mouth daily., Disp: 90 tablet, Rfl: 3 .  loperamide (IMODIUM) 2 MG capsule, Take 2 capsules (4 mg total) by mouth as needed for diarrhea or loose stools., Disp: 30 capsule, Rfl: 0 .  LORazepam (ATIVAN) 0.5 MG tablet, Take 0.5-1 mg by mouth daily as needed. , Disp: , Rfl:  .  meclizine (ANTIVERT) 25 MG tablet, Take 1 tablet (25 mg total) by mouth 3 (three) times daily as needed for dizziness., Disp: 90 tablet, Rfl: 6 .  NURTEC 75 MG TBDP, Take 1 tablet by mouth daily as needed., Disp: , Rfl:  .  nystatin (MYCOSTATIN/NYSTOP) powder, APPLY 1 APPLICATION TO AFFECTED AREA TWICE A DAY, Disp: 60 g, Rfl: 0 .  OnabotulinumtoxinA (BOTOX IJ), Inject as directed every 3 (three) months. For migraine, Disp: , Rfl:  .  oxyCODONE-acetaminophen (PERCOCET) 10-325 MG tablet, Take 1 tablet by mouth every 6 (six) hours as needed for pain., Disp: , Rfl:  .  pregabalin (LYRICA) 50 MG capsule, Take 50-300 mg by mouth See admin instructions. Taking 1 ('50mg'$ ) capsule during the day if needed and 6 capsules ('300mg'$ ) at bedtime, Disp: , Rfl:  .  SUMAtriptan (IMITREX) 100 MG tablet, Take 100 mg by mouth every 2 (two) hours as needed for migraine. May repeat in 2 hours if headache persists or recurs., Disp: , Rfl:  .  SYRINGE-NEEDLE, DISP, 3 ML (BD INTEGRA SYRINGE) 25G X 1" 3 ML MISC, FOR USE WITH B12 INJECTIONS WEEKLY/MONTHLY, Disp: 25 each, Rfl: 0 .  traMADol (ULTRAM) 50 MG tablet, TAKE ONE  TABLET BY MOUTH EVERY 6 HOURS AS NEEDED FOR RELIEF FROM PAIN, Disp: 90 tablet, Rfl: 1 .  Vitamin D, Ergocalciferol, (DRISDOL) 1.25 MG (50000 UT) CAPS capsule, TAKE 1 CAPSULE BY MOUTH ONE TIME PER WEEK, Disp: 12 capsule, Rfl: 1  EXAM:  VITALS per patient if applicable:  GENERAL: alert, oriented, appears well and in no acute distress  HEENT: atraumatic, conjunttiva clear, no obvious abnormalities on inspection of external nose and ears  NECK: normal movements of the head and neck  LUNGS: on inspection no signs of respiratory distress, breathing rate appears normal, no obvious gross SOB, gasping or wheezing  CV: no obvious cyanosis  MS: moves all visible extremities without noticeable abnormality  PSYCH/NEURO: pleasant and cooperative, no obvious depression or anxiety, speech and thought processing grossly intact  ASSESSMENT AND PLAN:  Discussed the following assessment and plan:  Postinfectious hypothyroidism  Chronic right shoulder pain  Postviral fatigue syndrome  Hypothyroid 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  Continue use of tramadol tid  Prn pain   MRI ordered by Orthoepdics   Postviral fatigue syndrome Improved in the last week after starting levothyroxine for TSH > 6     I discussed the assessment and treatment plan with the patient. The patient was provided an opportunity to ask questions and all were answered. The patient agreed with the plan and demonstrated an understanding of the instructions.   The patient was advised to call back or seek an in-person evaluation if the symptoms worsen or if the condition fails to improve as anticipated.  I provided  30 minutes of non-face-to-face time during this encounter reviewing patient's current problems and past surgeries, labs and  imaging studies, providing counseling on the above  mentioned problems , and coordination  of care .  [name], MD

## 2020-02-17 ENCOUNTER — Other Ambulatory Visit: Payer: Self-pay | Admitting: Internal Medicine

## 2020-02-21 ENCOUNTER — Other Ambulatory Visit: Payer: Self-pay | Admitting: Internal Medicine

## 2020-02-24 ENCOUNTER — Ambulatory Visit: Payer: BC Managed Care – PPO | Admitting: Podiatry

## 2020-03-01 ENCOUNTER — Telehealth: Payer: Self-pay

## 2020-03-01 NOTE — Telephone Encounter (Signed)
Call to patient to see how she is progressing and to see if she would like to come in to be seen by Dr Celine Ahr. The patient states that her abscesses have fully healed up and she has had no recurrence of them. She will call us back if she should start to develop any more. She is appreciative of the call.

## 2020-03-01 NOTE — Telephone Encounter (Signed)
-----   Message from Fredirick Maudlin, MD sent at 10/24/2019 10:50 AM EST ----- Regarding: RE: surgery Yes. She can contact us after she's recovered. ----- Message ----- From: Lesly Rubenstein, LPN Sent: [phone]  10:48 AM EST To: Fredirick Maudlin, MD, [facility] Subject: surgery                                        Patient is currently in [place] with Pneumonia ? Due to Covid. Do you want [place] to cancel her surgery scheduled for 10/29/19 for now?

## 2020-03-04 ENCOUNTER — Other Ambulatory Visit: Payer: BC Managed Care – PPO

## 2020-03-06 ENCOUNTER — Other Ambulatory Visit: Payer: Self-pay | Admitting: Internal Medicine

## 2020-03-08 ENCOUNTER — Other Ambulatory Visit: Payer: BC Managed Care – PPO

## 2020-03-11 ENCOUNTER — Other Ambulatory Visit (INDEPENDENT_AMBULATORY_CARE_PROVIDER_SITE_OTHER): Payer: BC Managed Care – PPO

## 2020-03-11 ENCOUNTER — Other Ambulatory Visit: Payer: Self-pay

## 2020-03-11 DIAGNOSIS — E039 Hypothyroidism, unspecified: Secondary | ICD-10-CM

## 2020-03-11 LAB — TSH: TSH: 2.06 u[IU]/mL (ref 0.35–4.50)

## 2020-03-12 LAB — THYROID PEROXIDASE ANTIBODY: Thyroperoxidase Ab SerPl-aCnc: <1 IU/mL (ref <9)

## 2020-03-12 LAB — THYROGLOBULIN LEVEL: Thyroglobulin: 16.1 ng/mL

## 2020-03-12 LAB — THYROGLOBULIN ANTIBODY: Thyroglobulin Ab: <1 IU/mL (ref <=1)

## 2020-03-17 ENCOUNTER — Ambulatory Visit (INDEPENDENT_AMBULATORY_CARE_PROVIDER_SITE_OTHER): Payer: BC Managed Care – PPO | Admitting: Family

## 2020-03-17 ENCOUNTER — Other Ambulatory Visit: Payer: Self-pay

## 2020-03-17 ENCOUNTER — Encounter: Payer: Self-pay | Admitting: Family

## 2020-03-17 VITALS — BP 150/80 | HR 93 | Ht 64.0 in | Wt 225.0 lb

## 2020-03-17 DIAGNOSIS — R0609 Other forms of dyspnea: Secondary | ICD-10-CM

## 2020-03-17 DIAGNOSIS — R000 Tachycardia, unspecified: Secondary | ICD-10-CM | POA: Diagnosis not present

## 2020-03-17 DIAGNOSIS — I10 Essential (primary) hypertension: Secondary | ICD-10-CM

## 2020-03-17 DIAGNOSIS — R0600 Dyspnea, unspecified: Secondary | ICD-10-CM | POA: Diagnosis not present

## 2020-03-17 MED ORDER — DILTIAZEM HCL ER COATED BEADS 300 MG PO CP24: ORAL_CAPSULE | ORAL | 1 refills | Status: DC

## 2020-03-17 NOTE — Patient Instructions (Addendum)
Medication Instructions:  Your physician has recommended you make the following change in your medication:   CHANGE Diltiazem to 300mg  daily  *If you need a refill on your cardiac medications before your next appointment, please call your pharmacy*  Lab Work: None today.  If you have labs (blood work) drawn today and your tests are completely normal, you will receive your results only by: Marland Kitchen MyChart Message (if you have MyChart) OR . A paper copy in the mail If you have any lab test that is abnormal or we need to change your treatment, we will call you to review the results.  Testing/Procedures: Your EKG today showed normal sinus rhythm 93 beats per minute. This is a stable finding.  Follow-Up: At [facility], you and your health needs are our priority.  As part of our continuing mission to provide you with exceptional heart care, we have created designated Provider Care Teams.  These Care Teams include your primary Cardiologist (physician) and Advanced Practice Providers (APPs -  Physician Assistants and Nurse Practitioners) who all work together to provide you with the care you need, when you need it.  We recommend signing up for the patient portal called "MyChart".  Sign up information is provided on this After Visit Summary.  MyChart is used to connect with patients for Virtual Visits (Telemedicine).  Patients are able to view lab/test results, encounter notes, upcoming appointments, etc.  Non-urgent messages can be sent to your provider as well.   To learn more about what you can do with MyChart, go to [email or web address].    Your next appointment:   6 month(s)  The format for your next appointment:   In Person  Provider:    You may see Kathlyn [place], MD or one of the following Advanced Practice Providers on your designated Care Team:    Murray Hodgkins, NP  Christell Faith, PA-C  Marrianne Mood, PA-C  Other Instructions  Recommend checking your blood pressure 2-3  times per week. Our goal is a blood pressure of less than 130/80. We will increase your Diltiazem to try to get to goal.

## 2020-03-17 NOTE — Progress Notes (Signed)
Office Visit    Patient Name: Maria Bentley Date of Encounter: 03/17/2020  Primary Care Provider:  [name], MD Primary Cardiologist:  Kathlyn Sacramento, MD Electrophysiologist:  None   Chief Complaint    Maria Bentley is a 49 y.o. female with a hx of inappropriate sinus tachycardia, elevated blood pressure, dyspnea, palpitations, DM2, morbid obesity, s/p gastric bypass presents today for follow-up of inappropriate sinus tachycardia  Past Medical History    Past Medical History:  Diagnosis Date  . Anemia   . Anxiety   . Depression 2010  . Diabetes mellitus   . DKA, type 2 ([place]) 10/13/2019  . Endometriosis   . Headache, common migraine, intractable   . History of ovarian cyst    left sided, caused by her endometriosis (possibly an endometrioma), in her 53s.  Marland Kitchen Hyperlipidemia   . Hypertension   . Kidney stones    left  . Morbid obesity ([place])    s/p gastric bypass  . Personal hx of gastric bypass 2010  . Rosacea   . Vertigo    Past Surgical History:  Procedure Laterality Date  . DILATION AND CURETTAGE OF UTERUS  2011  . gastric bypass    . OVARIAN CYST REMOVAL    . ROTATOR CUFF REPAIR     right    Allergies  Allergies  Allergen Reactions  . Amoxicillin Hives and Rash  . Avocado Swelling  . Clarithromycin Hives and Rash  . Honey Anaphylaxis  . Other Hives and Swelling    All melon  . Sulfa Drugs Cross Reactors Anaphylaxis  . Watermelon Concentrate Hives and Swelling    Oral swelling, ears swell shut  . Metoprolol Other (See Comments)    Hypotension  . Adhesive [Tape] Itching    Skin breakdown  . Levemir [Insulin Detemir] Rash    Red/swollen injection site reactions with residual lumps  . Victoza [Liraglutide] Nausea Only    History of Present Illness    Maria Bentley is a 49 y.o. female with a hx of  inappropriate sinus tachycardia, elevated blood pressure, dyspnea, palpitations, DM2, morbid obesity, s/p gastric bypass  last seen by Dr. Fletcher Anon 08/29/2019.  Initially seen by Dr. Mariea Clonts in 2014 for dyspnea and palpitations.  Noted resting tachycardia in spite of Toprol 100 mg daily.  48-hour Holter monitor showed normal sinus rhythm with no significant arrhythmia.  Noted rare PAC and PVC.  Echo at that time normal.  Taken off of metoprolol to do allergy shots and symptoms worsen.  Placed on lisinopril which caused worsening tachycardia and orthostatic hypotension.  Metoprolol was reinitiated but did not improve symptoms she was subsequently started on diltiazem with significant improvement in her symptoms.  At one point she stopped her medication and was noted to be hypertensive and tachycardic during office visit with outside provider who called our office and she was recommended to resume diltiazem.  At last office visit Diltiazem was increased to 119m daily. Echo was ordered for DOE.   [place] [facility] [place] [facility] [place] admission she was obtunded and in diabetic ketoacidosis and required intubation. Echo 10/2019 with LVEF 60-65%, gr1DD, RV mildly reduced systolic function, no significant valvular dysfunction. Reviewed results with her in the office today. Tells me she was out of work 3 months after discharge, then went back part time, and just started back full time last month.   Noted fatigue post-COVID and was diagnosed with post-infectious hyopthyroidism. Her most recent thyroid panel was  normal on current dose of Levothyroxine. Tells me her fatigue is improving.   Blood pressure and heart rate are improved since Verapamil was increased by PCP 01/2020. Does not check BP at home routinely. Does not check heart rate but can feel it "racing" when she does more than usual activity. Reports no chest pain, pressure, tightness. Reports no shortness of breath nor dyspnea on exertion.   EKGs/Labs/Other Studies Reviewed:   The following studies were reviewed today: Echo 10/2019 1. Left ventricular ejection fraction, by  visual estimation, is 60 to  65%. The left ventricle has normal function. There is no left ventricular  hypertrophy.   2. Left ventricular diastolic parameters are consistent with Grade I  diastolic dysfunction (impaired relaxation).   3. Global right ventricle has mildly reduced systolic function.The right  ventricular size is normal.   4. Left atrial size was normal.   5. Right atrial size was normal.   6. The mitral valve is normal in structure. No evidence of mitral valve  regurgitation. No evidence of mitral stenosis.   7. The tricuspid valve is normal in structure. Tricuspid valve  regurgitation is trivial.   8. The aortic valve is tricuspid. Aortic valve regurgitation is not  visualized. No evidence of aortic valve sclerosis or stenosis.   9. The pulmonic valve was grossly normal. Pulmonic valve regurgitation is  trivial.  10. Normal LV systolic function; grade 1 diastolic dysfunction; mild RV  dysfunction.   EKG:  EKG is ordered today.  The ekg ordered today demonstrates sinus rhythm 93 bpm left axis deviation and no acute ST/T wave changes.  Recent Labs: 11/19/2019: Magnesium 1.7 01/23/2020: ALT 24; BUN 23; Creatinine, Ser 0.98; Hemoglobin 12.9; Platelets 398.0; Potassium 4.1; Sodium 143 03/11/2020: TSH 2.06  Recent Lipid Panel    Component Value Date/Time   CHOL 194 09/19/2019 1646   TRIG 206 (H) 10/20/2019 0855   HDL 48 (L) 09/19/2019 1646   CHOLHDL 4.0 09/19/2019 1646   VLDL 26.8 11/22/2017 1609   LDLCALC 94 09/19/2019 1646   LDLDIRECT 92.0 02/21/2017 1456    Home Medications   Current Meds  Medication Sig  . acetaminophen (TYLENOL) 325 MG tablet Take 650 mg by mouth every 6 (six) hours as needed for mild pain.   Marland Kitchen albuterol (VENTOLIN HFA) 108 (90 Base) MCG/ACT inhaler Inhale 2 puffs into the lungs every 6 (six) hours as needed for wheezing or shortness of breath.  Marland Kitchen aspirin-acetaminophen-caffeine (EXCEDRIN MIGRAINE) 250-250-65 MG tablet Take 2 tablets by mouth  every 6 (six) hours as needed for headache.  Marland Kitchen atorvastatin (LIPITOR) 20 MG tablet TAKE 1 TABLET BY MOUTH EVERY DAY (Patient taking differently: Take 20 mg by mouth daily at 6 PM. )  . azelastine (ASTELIN) 0.1 % nasal spray INSTILL 1 2 SPRAYS IN EACH NOSTRIL TWICE A DAY  . blood glucose meter kit and supplies KIT Dispense based on patient and insurance preference. Use up to four times daily as directed. (FOR ICD-E11.21)  . buPROPion (WELLBUTRIN XL) 150 MG 24 hr tablet Take 450 mg by mouth daily.   . Continuous Blood Gluc Receiver (FREESTYLE LIBRE 14 DAY READER) DEVI 1 applicator by Does not apply route 4 (four) times daily.  . Continuous Blood Gluc Sensor (FREESTYLE LIBRE 14 DAY SENSOR) MISC Inject 1 application into the skin 4 (four) times daily as needed (uncontrolled diabetes).  . cyanocobalamin (,VITAMIN B-12,) 1000 MCG/ML injection INJECT 1 ML (1,000 MCG TOTAL) INTO THE MUSCLE ONCE A WEEK. FOR 4 WEEKS, THEN  MONTHLY THEREAFTER  . cyclobenzaprine (FLEXERIL) 10 MG tablet Take 5-10 mg by mouth 3 (three) times daily as needed for muscle spasms.   Marland Kitchen diltiazem (CARDIZEM CD) 240 MG 24 hr capsule TAKE 1 CAPSULE BY MOUTH EVERY DAY  . EPINEPHrine 0.3 mg/0.3 mL IJ SOAJ injection Inject 0.3 mg into the muscle as needed for anaphylaxis.   Marland Kitchen escitalopram (LEXAPRO) 20 MG tablet Take 20 mg by mouth daily.   . fluticasone (FLONASE) 50 MCG/ACT nasal spray SPRAY 2 SPRAYS INTO EACH NOSTRIL EVERY DAY  . gentamicin cream (GARAMYCIN) 0.1 % Apply 1 application topically 2 (two) times daily.  Marland Kitchen HORIZANT 600 MG TBCR Take 1 tablet by mouth 2 (two) times daily.  . INS SYRINGE/NEEDLE 1CC/28G (B-D INS SYR MICROFINE 1CC/28G) 28G X 1/2" 1 ML MISC USE 1 SYRINGE DAILY AFTER SUPPER  . Insulin Glargine (BASAGLAR KWIKPEN) 100 UNIT/ML SOPN Inject 0.5 mLs (50 Units total) into the skin daily.  . Insulin Pen Needle (BD PEN NEEDLE NANO U/F) 32G X 4 MM MISC Please specify directions, refills and quantity  . lamoTRIgine (LAMICTAL) 150  MG tablet Take 150 mg by mouth daily.  [name] Device MISC Test blood sugar two times daily  . levothyroxine (SYNTHROID) 50 MCG tablet Take 1 tablet (50 mcg total) by mouth daily.  Marland Kitchen loperamide (IMODIUM) 2 MG capsule Take 2 capsules (4 mg total) by mouth as needed for diarrhea or loose stools.  Marland Kitchen LORazepam (ATIVAN) 0.5 MG tablet Take 0.5-1 mg by mouth daily as needed.   . meclizine (ANTIVERT) 25 MG tablet Take 1 tablet (25 mg total) by mouth 3 (three) times daily as needed for dizziness.  Marland Kitchen NOVOLOG FLEXPEN 100 UNIT/ML FlexPen INJECT 20 UNITS INTO THE SKIN 3 TIMES DAILY WITH MEALS  . NURTEC 75 MG TBDP Take 1 tablet by mouth daily as needed.  . nystatin (MYCOSTATIN/NYSTOP) powder APPLY 1 APPLICATION TO AFFECTED AREA TWICE A DAY  . OnabotulinumtoxinA (BOTOX IJ) Inject as directed every 3 (three) months. For migraine  . oxyCODONE-acetaminophen (PERCOCET) 10-325 MG tablet Take 1 tablet by mouth every 6 (six) hours as needed for pain.  . pregabalin (LYRICA) 50 MG capsule Take 50-300 mg by mouth See admin instructions. Taking 1 (43m) capsule during the day if needed and 6 capsules (3065m at bedtime  . SUMAtriptan (IMITREX) 100 MG tablet Take 100 mg by mouth every 2 (two) hours as needed for migraine. May repeat in 2 hours if headache persists or recurs.  . Marland KitchenYRINGE-NEEDLE, DISP, 3 ML (BD INTEGRA SYRINGE) 25G X 1" 3 ML MISC FOR USE WITH B12 INJECTIONS WEEKLY/MONTHLY  . traMADol (ULTRAM) 50 MG tablet TAKE ONE TABLET BY MOUTH EVERY 6 HOURS AS NEEDED FOR RELIEF FROM PAIN  . Vitamin D, Ergocalciferol, (DRISDOL) 1.25 MG (50000 UT) CAPS capsule TAKE 1 CAPSULE BY MOUTH ONE TIME PER WEEK   Review of Systems      Review of Systems  Constitution: Positive for malaise/fatigue. Negative for chills and fever.  Cardiovascular: Negative for chest pain, dyspnea on exertion, leg swelling, near-syncope, orthopnea, palpitations and syncope.  Respiratory: Negative for cough, shortness of breath and wheezing.     Gastrointestinal: Negative for nausea and vomiting.  Neurological: Negative for dizziness, light-headedness and weakness.   All other systems reviewed and are otherwise negative except as noted above.  Physical Exam    VS:  LMP 05/09/2016 (LMP Unknown) Comment: D/C , BMI There is no height or weight on file to calculate BMI. GEN: Well nourished, overweight,  well developed, in no acute distress. HEENT: normal. Neck: Supple, no JVD, carotid bruits, or masses. Cardiac: RRR, no murmurs, rubs, or gallops. No clubbing, cyanosis, edema.  Radials/DP/PT 2+ and equal bilaterally.  Respiratory:  Respirations regular and unlabored, clear to auscultation bilaterally. GI: Soft, nontender, nondistended, BS + x 4. MS: No deformity or atrophy. Skin: Warm and dry, no rash. Neuro:  Strength and sensation are intact. Psych: Normal affect.  Accessory Clinical Findings    ECG personally reviewed by me today -sinus rhythm 93 bpm with left axis deviation- no acute changes.  Assessment & Plan    1. Inappropriate sinus tachycardia - When last seen by Dr. Fletcher Anon diltiazem increased to 180 mg.  Subsequently increased to 240 mg by her PCP February 2020.  Reports her blood pressure and tachycardia have improved. However, heart rate remains elevated 90 bpm and blood pressure remains elevated.  As such, increase diltiazem to 300 mg daily.  Noted intolerance to metoprolol.  2. Exertional dyspnea - States this is much improved since last seen by Dr. Fletcher Anon.  Echocardiogram 10/2019 with LVEF 60-65%, no LVH, grade 1 diastolic dysfunction.  No indication for further work-up this time.    3. HTN - Blood pressure above goal with reading 150/80 in the office today. Discussed goal BP <130/80.  Increase diltiazem to 300 mg daily.  She prefers to remain off since 5 medication regimen and as such we will increase diltiazem rather than adding additional agent.  However, in the future may require additional agent and would avoid  vasodilators or diuretics.  4. Fatigue -likely multifactorial deconditioning, post Covid infection diagnosis of hypothyroidism, and post Covid.  States this is improving.  Recent thyroid labs were normal on present dose of levothyroxine.  Encouraged her to continue to follow with her PCP.  5. Headaches - Upcoming appointment with headache specialist. Hopeful getting her BP to goal may improve symptoms.    Disposition: Follow up in 6 month(s) with Dr. Tor Netters, NP 03/17/2020, 9:09 AM

## 2020-03-25 ENCOUNTER — Telehealth: Payer: BC Managed Care – PPO | Admitting: Nurse Practitioner

## 2020-03-25 ENCOUNTER — Telehealth: Payer: Self-pay

## 2020-03-25 ENCOUNTER — Encounter: Payer: Self-pay | Admitting: Nurse Practitioner

## 2020-03-25 VITALS — Ht 64.0 in | Wt 225.0 lb

## 2020-03-25 DIAGNOSIS — R21 Rash and other nonspecific skin eruption: Secondary | ICD-10-CM

## 2020-03-25 MED ORDER — FLUOCINONIDE 0.05 % EX CREA: TOPICAL_CREAM | CUTANEOUS | 0 refills | Status: DC

## 2020-03-25 MED ORDER — METHYLPREDNISOLONE 4 MG PO TBPK: ORAL_TABLET | ORAL | 0 refills | Status: DC

## 2020-03-25 NOTE — Patient Instructions (Addendum)
It was very nice to meet you today via the video.  Your rash has  features of a contact dermatitis.   Order placed- but HOLD for now  the Medrol Dosepak  If you start - monitor your blood sugar closely as it will raise your blood sugar and you may need to re adjust your Novolg dose.   Please obtain a Covid test at [facility]. Please get labs at [facility] laboratory.   Will consult with Dermatology before recommending the steroid since you had Covid infection in the Oct.   Follow up TBD

## 2020-03-25 NOTE — Progress Notes (Signed)
Virtual Visit via Video Note  I connected with@ on 03/25/20 at  8:00 AM EDT by a video enabled telemedicine application and verified that I am speaking with the correct person using two identifiers. This visit type was conducted due to national recommendations for restrictions regarding the COVID-19 Pandemic (e.g. social distancing).  This format is felt to be most appropriate for this patient at this time.   I discussed the limitations of evaluation and management by telemedicine and the availability of in person appointments. The patient expressed understanding and agreed to proceed.  Only the patient and myself were on today's video visit. The patient was at home and I was in my office at the time of today's visit.   History of Present Illness: 49 yo female hosp for 2 weeks for Covid in early Nov  11/24/2019 and had seen Dr. [name] 01/2011 in Feb for fatigue. New start levothyroxine.   She saw her hairdresser Thurs and had foil highlighting and hair washed. Onset Friday, she noticed a couple bumps on back of posterior neck where her neck rested against the sink. No allergies to shampoos or highlights in the past. Nothing new as far as she knows with skin products. She started new med- levothyroxine in Feb .The rash is extremely itchy and red smallish bumps, not blistered and raised and where the most dense. It has spread to her anterior neck, just to center of throat and hull of throat, not down chest, redness jaw line, and a patch on cheek , not much on the face, down her back-in a strip whole back and a few patches on left arm and posterior buttocks. Not inner thighs. No fever/chills/ HA, URI sx and she feels well. No new sheets, detergents, soaps, etc. No rash on palms. No RUQ pain.   Last night she diarrhea -passed x3 non bloody  Liquid stool x 3 over 5-6 hours including 00320 x1 with urination small diarrhea. No abd pain or cramps. No BM today. She does get the occasional diarrhea - loose. No  other GI c/o.  She thought about Covid. She had a slight bout of diarrhea with Covid hospitalization attributed to SE of Miralax. Since she has been home a few bouts of diarrhea. She had critical care illness with Covid in Oct/Nov and was intubated for a week. "I almost died." She had her vaccine series Covid  Pfizer 02/09/20 and 03/01/2020.   Her DM is doing well and she has reduced her Novolog to 10 units am unless < BS75 then holds, lunch 15 units and none with dinner and lows late evening. and average BS is 129. She has had lows at 53 and she has adjusted Highs 173, FBS,100.eating more reg and healthier.   Observations/Objective: Gen: Awake, alert, no acute distress Resp: Breathing is even and non-labored Psych: calm/pleasant demeanor Neuro: Alert and Oriented x 3, + facial symmetry, speech is clear. Skin: See Epic photos, I cannot attach them here. Rash does not blanch.    Assessment and Plan: New onset pruritic rash that does not blanch. New med in Feb - levothyroxine. Hair -highlights Thurs rash back of neck and spread. Critical illness with Covid and hospitalized a month and intubated a week Oct/Nov. Had Pfizer vaccine series 3/1 and 03/01/2020. Does not feel systemically ill except slight fatigue from Covid. No fever/chills, cough, HA, body aches, RUQ pain, rash on palms.  An episode of diarrhea last eve- resolved. No other GI complaints.  Diabetic.   Concern about Covid  rash and will check CBC with DIFF, PLT, Cmet, ESR, CRP and retest for Covid infection. Discussed with Dr. Nicki Reaper about allergic rash and a Medrol dose Pak- but advised Jenny Reichmann not to pick it up, yet. I will try to consult with Derm through Epic before recommending steroid.     Follow Up Instructions:  I discussed the assessment and treatment plan with the patient. The patient was provided an opportunity to ask questions and all were answered. The patient agreed with the plan and demonstrated an understanding of the  instructions.   The patient was advised to call back or seek an in-person evaluation if the symptoms worsen or if the condition fails to improve as anticipated.    Denice Paradise, NP

## 2020-03-25 NOTE — Telephone Encounter (Signed)
I called and spoke with patient. I gave her number to call to get tested for Covid at [facility]. I also advised that Maria Bentley after speaking with Dr. Nicki Reaper felt that her rash was a contact dermatitis & that a medrol pack would be sent in to treat. I let patient know too that she will most likely see an increase in blood sugar numbers from this. She was advised to call us Monday if no improvement.

## 2020-03-25 NOTE — Telephone Encounter (Signed)
I canceled her medrol dose pak in favor of topical steroid cream after a Chat message with Dr. Nehemiah Massed who was kind enough to look at her Epic pictures. It looks like contact dermatitis. If she is Covid negative, he can see her in the office next Tuesday, Wednesday, or Thursday.  Jenny Reichmann is aware of this Rx change and POC for her to see DERM , and she will check her schedule.  She has seen Dr. Nehemiah Massed in the past and is very agreeable to this plan.

## 2020-03-26 ENCOUNTER — Other Ambulatory Visit: Payer: Self-pay

## 2020-03-26 ENCOUNTER — Emergency Department: Payer: BC Managed Care – PPO

## 2020-03-26 ENCOUNTER — Ambulatory Visit
Admission: EM | Admit: 2020-03-26 | Discharge: 2020-03-26 | Disposition: A | Discharge status: Home / Self Care | Payer: BC Managed Care – PPO | Attending: Family Medicine | Admitting: Family Medicine

## 2020-03-26 ENCOUNTER — Encounter: Payer: Self-pay | Admitting: Emergency Medicine

## 2020-03-26 ENCOUNTER — Other Ambulatory Visit: Payer: Self-pay | Admitting: Nurse Practitioner

## 2020-03-26 ENCOUNTER — Emergency Department
Admission: EM | Admit: 2020-03-26 | Discharge: 2020-03-26 | Disposition: A | Discharge status: Home / Self Care | Payer: BC Managed Care – PPO | Attending: Emergency Medicine | Admitting: Emergency Medicine

## 2020-03-26 DIAGNOSIS — Z87891 Personal history of nicotine dependence: Secondary | ICD-10-CM | POA: Diagnosis not present

## 2020-03-26 DIAGNOSIS — N3000 Acute cystitis without hematuria: Secondary | ICD-10-CM

## 2020-03-26 DIAGNOSIS — E11649 Type 2 diabetes mellitus with hypoglycemia without coma: Secondary | ICD-10-CM | POA: Diagnosis not present

## 2020-03-26 DIAGNOSIS — R0902 Hypoxemia: Secondary | ICD-10-CM

## 2020-03-26 DIAGNOSIS — Z794 Long term (current) use of insulin: Secondary | ICD-10-CM | POA: Diagnosis not present

## 2020-03-26 DIAGNOSIS — R739 Hyperglycemia, unspecified: Secondary | ICD-10-CM

## 2020-03-26 DIAGNOSIS — R358 Other polyuria: Secondary | ICD-10-CM

## 2020-03-26 DIAGNOSIS — R197 Diarrhea, unspecified: Secondary | ICD-10-CM

## 2020-03-26 DIAGNOSIS — I10 Essential (primary) hypertension: Secondary | ICD-10-CM | POA: Insufficient documentation

## 2020-03-26 DIAGNOSIS — R0602 Shortness of breath: Secondary | ICD-10-CM

## 2020-03-26 DIAGNOSIS — Z79899 Other long term (current) drug therapy: Secondary | ICD-10-CM | POA: Diagnosis not present

## 2020-03-26 DIAGNOSIS — R631 Polydipsia: Secondary | ICD-10-CM

## 2020-03-26 DIAGNOSIS — E162 Hypoglycemia, unspecified: Secondary | ICD-10-CM

## 2020-03-26 DIAGNOSIS — R05 Cough: Secondary | ICD-10-CM | POA: Insufficient documentation

## 2020-03-26 DIAGNOSIS — E1165 Type 2 diabetes mellitus with hyperglycemia: Secondary | ICD-10-CM

## 2020-03-26 DIAGNOSIS — E039 Hypothyroidism, unspecified: Secondary | ICD-10-CM | POA: Diagnosis not present

## 2020-03-26 DIAGNOSIS — R5383 Other fatigue: Secondary | ICD-10-CM

## 2020-03-26 DIAGNOSIS — R3589 Other polyuria: Secondary | ICD-10-CM

## 2020-03-26 DIAGNOSIS — L259 Unspecified contact dermatitis, unspecified cause: Secondary | ICD-10-CM | POA: Diagnosis not present

## 2020-03-26 DIAGNOSIS — R81 Glycosuria: Secondary | ICD-10-CM

## 2020-03-26 DIAGNOSIS — Z8616 Personal history of COVID-19: Secondary | ICD-10-CM | POA: Insufficient documentation

## 2020-03-26 DIAGNOSIS — R21 Rash and other nonspecific skin eruption: Secondary | ICD-10-CM

## 2020-03-26 DIAGNOSIS — R350 Frequency of micturition: Secondary | ICD-10-CM | POA: Diagnosis not present

## 2020-03-26 LAB — COMPREHENSIVE METABOLIC PANEL
ALT: 29 U/L (ref 0–44)
AST: 22 U/L (ref 15–41)
Albumin: 2.8 g/dL — ABNORMAL LOW (ref 3.5–5.0)
Alkaline Phosphatase: 104 U/L (ref 38–126)
Anion gap: 10 (ref 5–15)
BUN: 24 mg/dL — ABNORMAL HIGH (ref 6–20)
CO2: 26 mmol/L (ref 22–32)
Calcium: 8.2 mg/dL — ABNORMAL LOW (ref 8.9–10.3)
Chloride: 101 mmol/L (ref 98–111)
Creatinine, Ser: 1.21 mg/dL — ABNORMAL HIGH (ref 0.44–1.00)
GFR calc Af Amer: >60 mL/min (ref >60)
GFR calc non Af Amer: 52 mL/min — ABNORMAL LOW (ref >60)
Glucose, Bld: 269 mg/dL — ABNORMAL HIGH (ref 70–99)
Potassium: 4.9 mmol/L (ref 3.5–5.1)
Sodium: 137 mmol/L (ref 135–145)
Total Bilirubin: 0.6 mg/dL (ref 0.3–1.2)
Total Protein: 6.1 g/dL — ABNORMAL LOW (ref 6.5–8.1)

## 2020-03-26 LAB — CBC
HCT: 39.9 % (ref 36.0–46.0)
Hemoglobin: 12.2 g/dL (ref 12.0–15.0)
MCH: 30 pg (ref 26.0–34.0)
MCHC: 30.6 g/dL (ref 30.0–36.0)
MCV: 98 fL (ref 80.0–100.0)
Platelets: 402 10*3/uL — ABNORMAL HIGH (ref 150–400)
RBC: 4.07 MIL/uL (ref 3.87–5.11)
RDW: 15.2 % (ref 11.5–15.5)
WBC: 9.9 10*3/uL (ref 4.0–10.5)
nRBC: 0 % (ref 0.0–0.2)

## 2020-03-26 LAB — POCT URINALYSIS DIP (MANUAL ENTRY)
Bilirubin, UA: NEGATIVE
Blood, UA: NEGATIVE
Glucose, UA: 500 mg/dL — AB
Ketones, POC UA: NEGATIVE mg/dL
Leukocytes, UA: NEGATIVE
Nitrite, UA: POSITIVE — AB
Protein Ur, POC: NEGATIVE mg/dL
Spec Grav, UA: 1.025 (ref 1.010–1.025)
Urobilinogen, UA: 0.2 E.U./dL
pH, UA: 5.5 (ref 5.0–8.0)

## 2020-03-26 LAB — GLUCOSE, CAPILLARY
Glucose-Capillary: 44 mg/dL — CL (ref 70–99)
Glucose-Capillary: 57 mg/dL — ABNORMAL LOW (ref 70–99)
Glucose-Capillary: 181 mg/dL — ABNORMAL HIGH (ref 70–99)

## 2020-03-26 LAB — POC SARS CORONAVIRUS 2 AG -  ED: SARS Coronavirus 2 Ag: NEGATIVE

## 2020-03-26 MED ORDER — LACTATED RINGERS IV BOLUS
1000.0000 mL | Freq: Once | INTRAVENOUS | Status: AC
  Administered 2020-03-26: 1000 mL via INTRAVENOUS

## 2020-03-26 NOTE — Discharge Instructions (Addendum)
Given your history with diabetes and hypoxia with Covid, I really would like for you to go to the ER to be further evaluated.  I am concerned about your oxygen being 91% when you arrived here in our office.  You were responsive to 2 L/min of oxygen, which changed her entire demeanor in a good way.  You do also have a UTI, I will send an antibiotic for this as well as send for culture.  I would like for you to have a chest x-ray and stat labs, to be sure that there is nothing that were missing as far as pneumonia or infection goes.  Also concerned about electrolyte imbalance given diarrhea and fluid loss.

## 2020-03-26 NOTE — ED Notes (Signed)
Pt ambulatory on RA to restroom to provide sample.  Post-ambulation vitals documented.

## 2020-03-26 NOTE — ED Triage Notes (Signed)
Pt sent to urgent care from PCP.  Presents with rash on lower scalp, R neck, back, and LUE starting one week ago.  Had hair done day prior and initially thought could be reaction to product.  Reports diarrhea x 2 days, HA x 5 days and worsening fatigue. Requesting COVID test.  Had COVID in October.  Has had Pfizer vaccine.

## 2020-03-26 NOTE — ED Triage Notes (Signed)
Pt was seen at [facility] and dx'd with UTI. Pt reports her FSBS is abnormal. Pt states that her blood sugar this am was 53 and then she at and it was 353. Pt reports UC advised her to come to the ED.

## 2020-03-26 NOTE — ED Notes (Signed)
Pt trx to X-ray  

## 2020-03-26 NOTE — ED Provider Notes (Signed)
Roderic Palau    CSN: [id number] Arrival date & time: 03/26/20  3474      History   Chief Complaint No chief complaint on file.   HPI Maria Bentley is a 49 y.o. female.   Patient reports that she has been experiencing headache, diarrhea, and that her blood sugars have been "everywhere."  Patient has glucose sensor on left arm, per her meter at this time her blood sugar is 353.  She reports that her blood sugar was 53 when she woke up this morning.  Reports that she ate a breakfast with carbs and fruits and did not take any insulin afterwards.  Reports that she has history of her sugars getting out of control.  Reports that she has been having diarrhea for the last 3 days, headache for the last 5 days, rash for the last 5 days as well.  Reports that the rashes on her right neck and shoulder.  Reports that she was seen with her primary care yesterday, and given a steroid cream for this that is helping the rash.  She initially reported to urgent care today for a Covid test that she may be seen in her primary care's office.  Reports polydipsia, polyuria.  Patient also reporting shortness of breath on exertion. Denies fever, nausea, vomiting, other symptoms.  Patient has an extensive medical history including multiple food and drug allergies, sleep apnea, headache, anxiety, hypothyroidism, uncontrolled diabetes type 2, constipation, hypertension, chronic pain, history of COVID-19 infection, history of gastric bypass procedure, long-term use of insulin.  ROS per HPI  The history is provided by the patient.    Past Medical History:  Diagnosis Date  . Anemia   . Anxiety   . Depression 2010  . Diabetes mellitus   . DKA, type 2 ([place]) 10/13/2019  . Endometriosis   . Headache, common migraine, intractable   . History of ovarian cyst    left sided, caused by her endometriosis (possibly an endometrioma), in her 102s.  Marland Kitchen Hyperlipidemia   . Hypertension   . Kidney stones    left    . Morbid obesity ([place])    s/p gastric bypass  . Personal hx of gastric bypass 2010  . Rosacea   . Vertigo     Patient Active Problem List   Diagnosis Date Noted  . Chronic right shoulder pain 02/09/2020  . Insulin use (long-term) in type 2 diabetes ([place]) 01/26/2020  . 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 Recurrent headache 01/13/2020  . 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 Elevated alkaline phosphatase level 11/21/2019  . Hospital discharge follow-up 11/04/2019  . Cervical spondylosis without myelopathy 10/29/2019  . Pressure injury of skin 10/16/2019  . History of gastric bypass 10/13/2019  . COVID-19 virus infection 10/06/2019  . Preoperative evaluation of a medical condition to rule out surgical contraindications (TAR required) 09/21/2019  . Abscess of right buttock 04/30/2019  . Chronic pain 06/05/2018  . Iron deficiency anemia 06/05/2018  . Paronychia of great toe of left foot 11/24/2017  . Hypertension 11/24/2017  . Pelvic mass in female 04/05/2017  . Tendonitis 03/30/2016  . Left ankle sprain 03/30/2016  . Peroneal tendonitis 01/13/2016  . Hip pain, chronic 12/28/2015  . Pain in joint, ankle and foot 12/28/2015  . Constipation 12/27/2015  . 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 Noncompliance with diabetes treatment 06/08/2015  . Right hip pain 05/30/2014  . Vitamin D deficiency 05/30/2014  . Tachycardia 08/15/2013  .  Diabetes mellitus type 2, uncontrolled ([place]) 08/15/2013  . 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  . Dyspnea 07/15/2013  . Morbid obesity ([place]) 06/09/2012  . Anxiety   . Headache, common migraine, intractable   . Hyperlipidemia   . Hypothyroid   . OSA (obstructive sleep apnea) 02/05/2012    Past Surgical History:  Procedure Laterality Date  . DILATION AND CURETTAGE OF UTERUS  2011  . gastric bypass    . OVARIAN CYST REMOVAL    . ROTATOR CUFF REPAIR     right    OB History    Gravida  0    Para  0   Term  0   Preterm  0   AB  0   Living  0     SAB  0   TAB  0   Ectopic  0   Multiple  0   Live Births  0            Home Medications    Prior to Admission medications   Medication Sig Start Date End Date Taking? Authorizing Provider  acetaminophen (TYLENOL) 325 MG tablet Take 650 mg by mouth every 6 (six) hours as needed for mild pain.    Yes [provider]  albuterol (VENTOLIN HFA) 108 (90 Base) MCG/ACT inhaler Inhale 2 puffs into the lungs every 6 (six) hours as needed for wheezing or shortness of breath. 10/02/19  Yes Terald Sleeper, PA-C  aspirin-acetaminophen-caffeine (EXCEDRIN MIGRAINE) [phone] MG tablet Take 2 tablets by mouth every 6 (six) hours as needed for headache.   Yes [provider]  atorvastatin (LIPITOR) 20 MG tablet TAKE 1 TABLET BY MOUTH EVERY DAY Patient taking differently: Take 20 mg by mouth daily at 6 PM.  05/16/19  Yes [name], MD  azelastine (ASTELIN) 0.1 % nasal spray INSTILL 1 2 SPRAYS IN EACH NOSTRIL TWICE A DAY 12/22/19  Yes [provider]  blood glucose meter kit and supplies KIT Dispense based on patient and insurance preference. Use up to four times daily as directed. (FOR ICD-E11.21) 08/22/16  Yes [name], MD  buPROPion (WELLBUTRIN XL) 150 MG 24 hr tablet Take 450 mg by mouth daily.  05/12/18  Yes [provider]  Continuous Blood Gluc Receiver (FREESTYLE LIBRE 14 DAY READER) DEVI 1 applicator by Does not apply route 4 (four) times daily. 11/24/17  Yes [name], MD  Continuous Blood Gluc Sensor (FREESTYLE LIBRE 14 DAY SENSOR) MISC Inject 1 application into the skin 4 (four) times daily as needed (uncontrolled diabetes). 09/19/19  Yes [name], MD  cyanocobalamin (,VITAMIN B-12,) 1000 MCG/ML injection INJECT 1 ML (1,000 MCG TOTAL) INTO THE MUSCLE ONCE A WEEK. FOR 4 WEEKS, THEN MONTHLY THEREAFTER 02/18/19  Yes [name], MD  cyclobenzaprine (FLEXERIL) 10 MG  tablet Take 5-10 mg by mouth 3 (three) times daily as needed for muscle spasms.  05/21/18  Yes [provider]  diltiazem (CARDIZEM CD) 300 MG 24 hr capsule TAKE 1 CAPSULE BY MOUTH EVERY DAY 03/17/20  Yes Loel Dubonnet, NP  EPINEPHrine 0.3 mg/0.3 mL IJ SOAJ injection Inject 0.3 mg into the muscle as needed for anaphylaxis.  06/25/10  Yes [provider]  escitalopram (LEXAPRO) 20 MG tablet Take 20 mg by mouth daily.  02/27/17  Yes [provider]  fluocinonide cream (LIDEX) 0.05 % Apply sparingly up to 2 times a day-only on the  rash 03/25/20  Yes Marval Regal, NP  fluticasone Asencion Islam)  50 MCG/ACT nasal spray SPRAY 2 SPRAYS INTO EACH NOSTRIL EVERY DAY 11/24/19  Yes [name], MD  HORIZANT 600 MG TBCR Take 1 tablet by mouth 2 (two) times daily. 12/24/19  Yes [provider]  INS SYRINGE/NEEDLE 1CC/28G (B-D INS SYR MICROFINE 1CC/28G) 28G X 1/2" 1 ML MISC USE 1 SYRINGE DAILY AFTER SUPPER 08/01/18  Yes [name], MD  Insulin Glargine (BASAGLAR KWIKPEN) 100 UNIT/ML SOPN Inject 0.5 mLs (50 Units total) into the skin daily. 12/11/19  Yes [name], MD  Insulin Pen Needle (BD PEN NEEDLE NANO U/F) 32G X 4 MM MISC Please specify directions, refills and quantity 08/22/18  Yes [name], MD  lamoTRIgine (LAMICTAL) 150 MG tablet Take 150 mg by mouth daily.   Yes [provider]  Lancet Device MISC Test blood sugar two times daily 07/29/12  Yes [name], MD  levothyroxine (SYNTHROID) 50 MCG tablet Take 1 tablet (50 mcg total) by mouth daily. 01/26/20  Yes [name], MD  loperamide (IMODIUM) 2 MG capsule Take 2 capsules (4 mg total) by mouth as needed for diarrhea or loose stools. 10/25/19  Yes Amin, Ankit Chirag, MD  LORazepam (ATIVAN) 0.5 MG tablet Take 0.5-1 mg by mouth daily as needed.    Yes [provider]  meclizine (ANTIVERT) 25 MG tablet Take 1 tablet (25 mg total) by mouth 3 (three) times daily as needed for dizziness.  06/16/15  Yes [name], MD  NOVOLOG FLEXPEN 100 UNIT/ML FlexPen INJECT 20 UNITS INTO THE SKIN 3 TIMES DAILY WITH MEALS 02/23/20  Yes [name], MD  NURTEC 75 MG TBDP Take 1 tablet by mouth daily as needed. 09/19/19  Yes [provider]  nystatin (MYCOSTATIN/NYSTOP) powder APPLY 1 APPLICATION TO AFFECTED AREA TWICE A DAY 02/18/20  Yes [name], MD  OnabotulinumtoxinA (BOTOX IJ) Inject as directed every 3 (three) months. For migraine   Yes [provider]  oxyCODONE-acetaminophen (PERCOCET) 10-325 MG tablet Take 1 tablet by mouth every 6 (six) hours as needed for pain.   Yes [provider]  SUMAtriptan (IMITREX) 100 MG tablet Take 100 mg by mouth every 2 (two) hours as needed for migraine. May repeat in 2 hours if headache persists or recurs.   Yes [provider]  SYRINGE-NEEDLE, DISP, 3 ML (BD INTEGRA SYRINGE) 25G X 1" 3 ML MISC FOR USE WITH B12 INJECTIONS WEEKLY/MONTHLY 08/01/18  Yes [name], MD  traMADol (ULTRAM) 50 MG tablet TAKE ONE TABLET BY MOUTH EVERY 6 HOURS AS NEEDED FOR RELIEF FROM PAIN 01/13/20  Yes [name], MD  Vitamin D, Ergocalciferol, (DRISDOL) 1.25 MG (50000 UT) CAPS capsule TAKE 1 CAPSULE BY MOUTH ONE TIME PER WEEK 10/27/19  Yes [name], MD    Family History Family History  Problem Relation Age of Onset  . Diabetes Mother   . Hypertension Mother   . Hypertension Father   . Diabetes Father   . Depression Other        strong hx of  . Breast cancer Paternal Grandmother 25  . Cancer Neg Hx     Social History Social History   Tobacco Use  . Smoking status: Former Smoker    Packs/day: 1.00    Years: 4.00    Pack years: 4.00    Types: Cigarettes    Quit date: 02/05/1996    Years since quitting: 24.1  . Smokeless tobacco: Never Used  Substance Use Topics  .  Alcohol use: No  . Drug use: No     Allergies   Amoxicillin, Avocado, Clarithromycin, Honey, Other, Sulfa drugs cross reactors, Watermelon  concentrate, Metoprolol, Adhesive [tape], Levemir [insulin detemir], and Victoza [liraglutide]   Review of Systems Review of Systems   Physical Exam Triage Vital Signs ED Triage Vitals  Enc Vitals Group     BP 03/26/20 0938 129/74     Pulse Rate 03/26/20 0938 (!) 132     Resp 03/26/20 0938 20     Temp 03/26/20 0938 98.6 F (37 C)     Temp Source 03/26/20 0938 Oral     SpO2 03/26/20 0938 93 %     Weight --      Height --      Head Circumference --      Peak Flow --      Pain Score 03/26/20 0946 3     Pain Loc --      Pain Edu? --      Excl. in [place]? --    No data found.  Updated Vital Signs BP 129/74 (BP Location: Right Arm)   Pulse (!) 114   Temp 98.6 F (37 C) (Oral)   Resp 20   LMP 05/09/2016 (LMP Unknown) Comment: D/C  SpO2 95%   Visual Acuity Right Eye Distance:   Left Eye Distance:   Bilateral Distance:    Right Eye Near:   Left Eye Near:    Bilateral Near:     Physical Exam Vitals and nursing note reviewed.  Constitutional:      General: She is not in acute distress.    Appearance: She is well-developed. She is obese. She is ill-appearing.  HENT:     Head: Normocephalic and atraumatic.     Right Ear: Tympanic membrane normal.     Left Ear: Tympanic membrane normal.     Nose: Nose normal.     Mouth/Throat:     Mouth: Mucous membranes are moist.     Pharynx: Oropharynx is clear.  Eyes:     Conjunctiva/sclera: Conjunctivae normal.  Neck:     Vascular: No carotid bruit.  Cardiovascular:     Rate and Rhythm: Regular rhythm. Tachycardia present.     Heart sounds: Normal heart sounds. No murmur.  Pulmonary:     Effort: Pulmonary effort is normal. No respiratory distress.     Breath sounds: No stridor. Examination of the right-middle field reveals decreased breath sounds. Examination of the left-middle field reveals decreased breath sounds. Examination of the right-lower field reveals decreased breath sounds. Examination of the left-lower field reveals  decreased breath sounds. Decreased breath sounds present. No wheezing, rhonchi or rales.  Chest:     Chest wall: No tenderness.  Abdominal:     General: Bowel sounds are increased.     Palpations: Abdomen is soft.     Tenderness: There is no abdominal tenderness.  Musculoskeletal:        General: Normal range of motion.     Cervical back: Normal range of motion and neck supple. No rigidity or tenderness.  Lymphadenopathy:     Cervical: Cervical adenopathy (Right side) present.  Skin:    General: Skin is warm and dry.     Capillary Refill: Capillary refill takes less than 2 seconds.  Neurological:     General: No focal deficit present.     Mental Status: She is alert and oriented to person, place, and time.  Psychiatric:  Mood and Affect: Mood normal.        Behavior: Behavior normal.        Thought Content: Thought content normal.      UC Treatments / Results  Labs (all labs ordered are listed, but only abnormal results are displayed) Labs Reviewed  POCT URINALYSIS DIP (MANUAL ENTRY) - Abnormal; Notable for the following components:      Result Value   Glucose, UA =500 (*)    Nitrite, UA Positive (*)    All other components within normal limits  POC SARS CORONAVIRUS 2 AG -  ED    EKG   Radiology No results found.  Procedures Procedures (including critical care time)  Medications Ordered in UC Medications - No data to display  Initial Impression / Assessment and Plan / UC Course  I have reviewed the triage vital signs and the nursing notes.  Pertinent labs & imaging results that were available during my care of the patient were reviewed by me and considered in my medical decision making (see chart for details).     Presents to urgent care with multiple complaints: Presents with diarrhea for the last 3 days, headache for the last 5 days fatigue that has been ongoing for months rash to the right neck that she has already been seen and is being treated for,  urinary frequency, urinary urgency, hyperglycemia.  Upon arrival and initial vitals assessment, patient's oxygen saturation was at 91% placed on 2 L O2 via nasal cannula and after about 5 minutes, patient O2 saturation came up to 97%.  Patient has glucose sensor on her left arm, I had her check her sugar in the office with this and it was 353, which is consistent with uncontrolled diabetes, she reports her blood sugar at 53 when she woke up this morning.  Patient has made attempts to balance her sugar with carbs, sugars, insulin without success.  UA in office positive for 500 sugars, positive nitrites.  Prescribed Macrobid 500 mg twice daily x5 days to treat UTI.  Discussed with patient the need for her to report to the ER for further evaluation and treatment due to her fluid loss, uncontrolled blood sugars, shortness of breath, decreased oxygen saturation.  The combination of these can be very dangerous, patient agrees to report to the ER.  Declines EMS transport.  Upon leaving the office, patient was stable on her feet, vitals were stable.  Sent note to Letitia Neri, PA in the ER at [facility] to give some report on this patient and let them know that she was on her way. Final Clinical Impressions(s) / UC Diagnoses   Final diagnoses:  Hypoxia  Diarrhea, unspecified type  Hyperglycemia  Other fatigue  Rash and nonspecific skin eruption  Acute cystitis without hematuria  Polyuria  Polydipsia  Uncontrolled type 2 diabetes mellitus with hyperglycemia (HCC)  Glucosuria  SOBOE (shortness of breath on exertion)     Discharge Instructions     Given your history with diabetes and hypoxia with Covid, I really would like for you to go to the ER to be further evaluated.  I am concerned about your oxygen being 91% when you arrived here in our office.  You were responsive to 2 L/min of oxygen, which changed her entire demeanor in a good way.  You do also have a UTI, I will send an antibiotic for  this as well as send for culture.  I would like for you to have a chest x-ray and  stat labs, to be sure that there is nothing that were missing as far as pneumonia or infection goes.  Also concerned about electrolyte imbalance given diarrhea and fluid loss.    ED Prescriptions    None     PDMP not reviewed this encounter.   Faustino Congress, NP 03/26/20 1315

## 2020-03-26 NOTE — ED Provider Notes (Signed)
[facility] Emergency Department Provider Note   ____________________________________________   First MD Initiated Contact with Patient 03/26/20 1226     (approximate)  I have reviewed the triage vital signs and the nursing notes.   HISTORY  Chief Complaint Hyperglycemia   HPI Maria Bentley is a 49 y.o. female with past medical history of diabetes, hypertension, hyperlipidemia, and gastric bypass who presents to the ED for hyperglycemia.  Patient reports that she initially developed a rash to the right side of her neck and shoulder 1 week ago after being seen for hair dye by her hairdresser.  She reports similar reactions in the past to various soaps and detergents, was subsequently diagnosed with contact dermatitis by her PCP and started on steroid cream.  Over the past 3 days, she has noticed some diarrhea and occasional mild shortness of breath with nonproductive cough.  She was concerned she might have developed COVID-19 and was seen at an urgent care earlier today for COVID-19 testing, with negative point-of-care test.  They found that her blood glucose was elevated and patient reported it had been low earlier in the morning.  They then referred her to the ED for further evaluation.  She was also diagnosed with UTI at the urgent care, does endorse recent urinary frequency.        Past Medical History:  Diagnosis Date  . Anemia   . Anxiety   . Depression 2010  . Diabetes mellitus   . DKA, type 2 ([place]) 10/13/2019  . Endometriosis   . Headache, common migraine, intractable   . History of ovarian cyst    left sided, caused by her endometriosis (possibly an endometrioma), in her 41s.  Marland Kitchen Hyperlipidemia   . Hypertension   . Kidney stones    left  . Morbid obesity ([place])    s/p gastric bypass  . Personal hx of gastric bypass 2010  . Rosacea   . Vertigo     Patient Active Problem List   Diagnosis Date Noted  . Chronic right shoulder pain  02/09/2020  . Insulin use (long-term) in type 2 diabetes ([place]) 01/26/2020  . 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 Recurrent headache 01/13/2020  . 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 Elevated alkaline phosphatase level 11/21/2019  . Hospital discharge follow-up 11/04/2019  . Cervical spondylosis without myelopathy 10/29/2019  . Pressure injury of skin 10/16/2019  . History of gastric bypass 10/13/2019  . COVID-19 virus infection 10/06/2019  . Preoperative evaluation of a medical condition to rule out surgical contraindications (TAR required) 09/21/2019  . Abscess of right buttock 04/30/2019  . Chronic pain 06/05/2018  . Iron deficiency anemia 06/05/2018  . Paronychia of great toe of left foot 11/24/2017  . Hypertension 11/24/2017  . Pelvic mass in female 04/05/2017  . Tendonitis 03/30/2016  . Left ankle sprain 03/30/2016  . Peroneal tendonitis 01/13/2016  . Hip pain, chronic 12/28/2015  . Pain in joint, ankle and foot 12/28/2015  . Constipation 12/27/2015  . 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 Noncompliance with diabetes treatment 06/08/2015  . Right hip pain 05/30/2014  . Vitamin D deficiency 05/30/2014  . Tachycardia 08/15/2013  . Diabetes mellitus type 2, uncontrolled ([place]) 08/15/2013  . 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  . Dyspnea 07/15/2013  . Morbid obesity ([place]) 06/09/2012  . Anxiety   . Headache, common migraine, intractable   . Hyperlipidemia   . Hypothyroid   . OSA (obstructive sleep apnea) 02/05/2012  Past Surgical History:  Procedure Laterality Date  . DILATION AND CURETTAGE OF UTERUS  2011  . gastric bypass    . OVARIAN CYST REMOVAL    . ROTATOR CUFF REPAIR     right    Prior to Admission medications   Medication Sig Start Date End Date Taking? Authorizing Provider  acetaminophen (TYLENOL) 325 MG tablet Take 650 mg by mouth every 6 (six) hours as needed for mild pain.     [provider]  albuterol (VENTOLIN HFA) 108 (90 Base) MCG/ACT inhaler Inhale 2 puffs into the lungs every 6 (six) hours as needed for wheezing or shortness of breath. 10/02/19   Terald Sleeper, PA-C  aspirin-acetaminophen-caffeine (EXCEDRIN MIGRAINE) [phone] MG tablet Take 2 tablets by mouth every 6 (six) hours as needed for headache.    [provider]  atorvastatin (LIPITOR) 20 MG tablet TAKE 1 TABLET BY MOUTH EVERY DAY Patient taking differently: Take 20 mg by mouth daily at 6 PM.  05/16/19   [name], MD  azelastine (ASTELIN) 0.1 % nasal spray INSTILL 1 2 SPRAYS IN EACH NOSTRIL TWICE A DAY 12/22/19   [provider]  blood glucose meter kit and supplies KIT Dispense based on patient and insurance preference. Use up to four times daily as directed. (FOR ICD-E11.21) 08/22/16   [name], MD  buPROPion (WELLBUTRIN XL) 150 MG 24 hr tablet Take 450 mg by mouth daily.  05/12/18   [provider]  Continuous Blood Gluc Receiver (FREESTYLE LIBRE 14 DAY READER) DEVI 1 applicator by Does not apply route 4 (four) times daily. 11/24/17   [name], MD  Continuous Blood Gluc Sensor (FREESTYLE LIBRE 14 DAY SENSOR) MISC Inject 1 application into the skin 4 (four) times daily as needed (uncontrolled diabetes). 09/19/19   [name], MD  cyanocobalamin (,VITAMIN B-12,) 1000 MCG/ML injection INJECT 1 ML (1,000 MCG TOTAL) INTO THE MUSCLE ONCE A WEEK. FOR 4 WEEKS, THEN MONTHLY THEREAFTER 02/18/19   [name], MD  cyclobenzaprine (FLEXERIL) 10 MG tablet Take 5-10 mg by mouth 3 (three) times daily as needed for muscle spasms.  05/21/18   [provider]  diltiazem (CARDIZEM CD) 300 MG 24 hr capsule TAKE 1 CAPSULE BY MOUTH EVERY DAY 03/17/20   Loel Dubonnet, NP  EPINEPHrine 0.3 mg/0.3 mL IJ SOAJ injection Inject 0.3 mg into the muscle as needed for anaphylaxis.  06/25/10   [provider]  escitalopram (LEXAPRO) 20 MG tablet Take 20 mg by mouth daily.  02/27/17    [provider]  fluocinonide cream (LIDEX) 0.05 % Apply sparingly up to 2 times a day-only on the  rash 03/25/20   Marval Regal, NP  fluticasone (FLONASE) 50 MCG/ACT nasal spray SPRAY 2 SPRAYS INTO EACH NOSTRIL EVERY DAY 11/24/19   [name], MD  HORIZANT 600 MG TBCR Take 1 tablet by mouth 2 (two) times daily. 12/24/19   [provider]  INS SYRINGE/NEEDLE 1CC/28G (B-D INS SYR MICROFINE 1CC/28G) 28G X 1/2" 1 ML MISC USE 1 SYRINGE DAILY AFTER SUPPER 08/01/18   [name], MD  Insulin Glargine (BASAGLAR KWIKPEN) 100 UNIT/ML SOPN Inject 0.5 mLs (50 Units total) into the skin daily. 12/11/19   [name], MD  Insulin Pen Needle (BD PEN NEEDLE NANO U/F) 32G X 4 MM MISC Please specify directions, refills and quantity 08/22/18   [name], MD  lamoTRIgine (LAMICTAL) 150 MG tablet Take 150 mg by  mouth daily.    [provider]  Lancet Device MISC Test blood sugar two times daily 07/29/12   [name], MD  levothyroxine (SYNTHROID) 50 MCG tablet Take 1 tablet (50 mcg total) by mouth daily. 01/26/20   [name], MD  loperamide (IMODIUM) 2 MG capsule Take 2 capsules (4 mg total) by mouth as needed for diarrhea or loose stools. 10/25/19   Amin, Jeanella Flattery, MD  LORazepam (ATIVAN) 0.5 MG tablet Take 0.5-1 mg by mouth daily as needed.     [provider]  meclizine (ANTIVERT) 25 MG tablet Take 1 tablet (25 mg total) by mouth 3 (three) times daily as needed for dizziness. 06/16/15   [name], MD  NOVOLOG FLEXPEN 100 UNIT/ML FlexPen INJECT 20 UNITS INTO THE SKIN 3 TIMES DAILY WITH MEALS 02/23/20   [name], MD  NURTEC 75 MG TBDP Take 1 tablet by mouth daily as needed. 09/19/19   [provider]  nystatin (MYCOSTATIN/NYSTOP) powder APPLY 1 APPLICATION TO AFFECTED AREA TWICE A DAY 02/18/20   [name], MD  OnabotulinumtoxinA (BOTOX IJ) Inject as directed every 3 (three) months. For migraine    [provider]    oxyCODONE-acetaminophen (PERCOCET) 10-325 MG tablet Take 1 tablet by mouth every 6 (six) hours as needed for pain.    [provider]  SUMAtriptan (IMITREX) 100 MG tablet Take 100 mg by mouth every 2 (two) hours as needed for migraine. May repeat in 2 hours if headache persists or recurs.    [provider]  SYRINGE-NEEDLE, DISP, 3 ML (BD INTEGRA SYRINGE) 25G X 1" 3 ML MISC FOR USE WITH B12 INJECTIONS WEEKLY/MONTHLY 08/01/18   [name], MD  traMADol (ULTRAM) 50 MG tablet TAKE ONE TABLET BY MOUTH EVERY 6 HOURS AS NEEDED FOR RELIEF FROM PAIN 01/13/20   [name], MD  Vitamin D, Ergocalciferol, (DRISDOL) 1.25 MG (50000 UT) CAPS capsule TAKE 1 CAPSULE BY MOUTH ONE TIME PER WEEK 10/27/19   [name], MD    Allergies Amoxicillin, Avocado, Clarithromycin, Honey, Other, Sulfa drugs cross reactors, Watermelon concentrate, Metoprolol, Adhesive [tape], Levemir [insulin detemir], and Victoza [liraglutide]  Family History  Problem Relation Age of Onset  . Diabetes Mother   . Hypertension Mother   . Hypertension Father   . Diabetes Father   . Depression Other        strong hx of  . Breast cancer Paternal Grandmother 105  . Cancer Neg Hx     Social History Social History   Tobacco Use  . Smoking status: Former Smoker    Packs/day: 1.00    Years: 4.00    Pack years: 4.00    Types: Cigarettes    Quit date: 02/05/1996    Years since quitting: 24.1  . Smokeless tobacco: Never Used  Substance Use Topics  . Alcohol use: No  . Drug use: No    Review of Systems  Constitutional: No fever/chills Eyes: No visual changes. ENT: No sore throat. Cardiovascular: Denies chest pain. Respiratory: Positive for cough and shortness of breath. Gastrointestinal: No abdominal pain.  No nausea, no vomiting.  No diarrhea.  No constipation. Genitourinary: Negative for dysuria.  Positive for urinary frequency. Musculoskeletal: Negative for back pain. Skin: Positive for  rash. Neurological: Negative for headaches, focal weakness or numbness.  ____________________________________________   PHYSICAL EXAM:  VITAL SIGNS: ED Triage Vitals  Enc Vitals Group     BP 03/26/20 1118 (!) 152/91  Pulse Rate 03/26/20 1118 (!) 104     Resp 03/26/20 1118 20     Temp 03/26/20 1122 98.2 F (36.8 C)     Temp Source 03/26/20 1122 Oral     SpO2 03/26/20 1118 95 %     Weight 03/26/20 1118 225 lb (102.1 kg)     Height 03/26/20 1118 _0  (1.626 m)     Head Circumference --      Peak Flow --      Pain Score 03/26/20 1118 3     Pain Loc --      Pain Edu? --      Excl. in [place]? --     Constitutional: Alert and oriented. Eyes: Conjunctivae are normal. Head: Atraumatic. Nose: No congestion/rhinnorhea. Mouth/Throat: Mucous membranes are moist. Neck: Normal ROM Cardiovascular: Tachycardic, regular rhythm. Grossly normal heart sounds. Respiratory: Normal respiratory effort.  No retractions. Lungs CTAB. Gastrointestinal: Soft and nontender. No distention. Genitourinary: deferred Musculoskeletal: No lower extremity tenderness nor edema. Neurologic:  Normal speech and language. No gross focal neurologic deficits are appreciated. Skin:  Skin is warm, dry and intact.  Erythematous maculopapular rash to area of right neck extending over her right trapezius, no tenderness or purulence noted. Psychiatric: Mood and affect are normal. Speech and behavior are normal.  ____________________________________________   LABS (all labs ordered are listed, but only abnormal results are displayed)  Labs Reviewed  COMPREHENSIVE METABOLIC PANEL - Abnormal; Notable for the following components:      Result Value   Glucose, Bld 269 (*)    BUN 24 (*)    Creatinine, Ser 1.21 (*)    Calcium 8.2 (*)    Total Protein 6.1 (*)    Albumin 2.8 (*)    GFR calc non Af Amer 52 (*)    All other components within normal limits  CBC - Abnormal; Notable for the following components:    Platelets 402 (*)    All other components within normal limits  GLUCOSE, CAPILLARY - Abnormal; Notable for the following components:   Glucose-Capillary 44 (*)    All other components within normal limits  GLUCOSE, CAPILLARY - Abnormal; Notable for the following components:   Glucose-Capillary 57 (*)    All other components within normal limits  GLUCOSE, CAPILLARY - Abnormal; Notable for the following components:   Glucose-Capillary 181 (*)    All other components within normal limits   ____________________________________________  EKG  ED ECG REPORT I, Blake Divine, the attending physician, personally viewed and interpreted this ECG.   Date: 03/26/2020  EKG Time: 13:46  Rate: 95  Rhythm: normal sinus rhythm  Axis: LAD  Intervals:none  ST&T Change: LVH   PROCEDURES  Procedure(s) performed (including Critical Care):  Procedures   ____________________________________________   INITIAL IMPRESSION / ASSESSMENT AND PLAN / ED COURSE       49 year old female with history of diabetes presents to the ED complaining of 1 week of red itchy rash to her right neck, has had some diarrhea and mild cough with shortness of breath more recently.  She is not in any respiratory distress and is maintaining O2 sats on room air.  Rash to her right neck does appear consistent with contact dermatitis and I agree with plan for steroid cream.  Lab work thus far is unremarkable and patient previously had COVID-19 testing performed that is now pending.  UA from urgent care does appear consistent with UTI given positive nitrites.  We will further assess with EKG and chest  x-ray, patient also noted to be hyperglycemic with no evidence of DKA.  We will hydrate with IV fluids and reevaluate.  EKG and chest x-ray are unremarkable.  Patient did have brief episode of hypoglycemia with no change in her mental status, she was subsequently given something to eat and glucose levels remained stable during  period of observation.  She is appropriate for discharge home and I have counseled her to follow-up with her PCP, closely monitor her glucose levels.  Patient counseled to return to the ED for new or worsening symptoms, patient agrees with plan.      ____________________________________________   FINAL CLINICAL IMPRESSION(S) / ED DIAGNOSES  Final diagnoses:  Hypoglycemia  Contact dermatitis, unspecified contact dermatitis type, unspecified trigger     ED Discharge Orders    None       Note:  This document was prepared using Dragon voice recognition software and may include unintentional dictation errors.   Blake Divine, MD 03/26/20 [phone]

## 2020-03-29 ENCOUNTER — Telehealth: Payer: Self-pay | Admitting: Nurse Practitioner

## 2020-03-29 ENCOUNTER — Telehealth: Payer: Self-pay | Admitting: Family Medicine

## 2020-03-29 MED ORDER — NITROFURANTOIN MONOHYD MACRO 100 MG PO CAPS: 100.0000 mg | ORAL_CAPSULE | Freq: Two times a day (BID) | ORAL | 0 refills | Status: DC

## 2020-03-29 NOTE — Telephone Encounter (Signed)
No urine culture visible is pending.  We will follow the treatment plan of using Macrobid to treat UTI.  Prescription sent electronically to the pharmacy.  If patient continues to have symptoms, please have her return to the clinic as soon as possible.

## 2020-03-29 NOTE — Telephone Encounter (Signed)
I called her and LMOM to give [place] a call back for update. I see her Acute care and ED visits. I want to speak with her when she calls back.

## 2020-03-29 NOTE — Telephone Encounter (Signed)
Pt called to report her pharmacy has not received the antibiotic from her visit 03/26/20. Per OV note provider, Faustino Congress NP, was to send in [place]:  - Prescribed Macrobid 500 mg twice daily x5 days to treat UTI.  - You do also have a UTI, I will send an antibiotic for this as well as send for culture.  Per chart this rx was not sent. Also, per chart, no urine culture was ordered or collected.   Please advise.  Thank you!

## 2020-04-05 ENCOUNTER — Other Ambulatory Visit: Payer: Self-pay | Admitting: Internal Medicine

## 2020-04-10 ENCOUNTER — Other Ambulatory Visit: Payer: Self-pay | Admitting: Internal Medicine

## 2020-04-13 ENCOUNTER — Encounter: Payer: Self-pay | Admitting: Obstetrics and Gynecology

## 2020-04-13 ENCOUNTER — Ambulatory Visit (INDEPENDENT_AMBULATORY_CARE_PROVIDER_SITE_OTHER): Payer: BC Managed Care – PPO | Admitting: Obstetrics and Gynecology

## 2020-04-13 ENCOUNTER — Other Ambulatory Visit (HOSPITAL_COMMUNITY)
Admission: RE | Admit: 2020-04-13 | Discharge: 2020-04-13 | Disposition: A | Discharge status: Home / Self Care | Payer: BC Managed Care – PPO | Source: Ambulatory Visit | Attending: Obstetrics and Gynecology | Admitting: Obstetrics and Gynecology

## 2020-04-13 ENCOUNTER — Other Ambulatory Visit: Payer: Self-pay

## 2020-04-13 VITALS — BP 137/102 | HR 134 | Ht 64.0 in | Wt 236.1 lb

## 2020-04-13 DIAGNOSIS — Z780 Asymptomatic menopausal state: Secondary | ICD-10-CM | POA: Diagnosis not present

## 2020-04-13 DIAGNOSIS — Z01419 Encounter for gynecological examination (general) (routine) without abnormal findings: Secondary | ICD-10-CM | POA: Insufficient documentation

## 2020-04-13 DIAGNOSIS — E11649 Type 2 diabetes mellitus with hypoglycemia without coma: Secondary | ICD-10-CM

## 2020-04-13 DIAGNOSIS — Z8616 Personal history of COVID-19: Secondary | ICD-10-CM

## 2020-04-13 DIAGNOSIS — I10 Essential (primary) hypertension: Secondary | ICD-10-CM

## 2020-04-13 DIAGNOSIS — Z1231 Encounter for screening mammogram for malignant neoplasm of breast: Secondary | ICD-10-CM

## 2020-04-13 NOTE — Progress Notes (Signed)
GYNECOLOGY ANNUAL PHYSICAL EXAM PROGRESS NOTE  Subjective:    Maria Bentley is a 49 y.o. G0P0 menopausal female who presents for an annual exam. She is sexually active. She denies post-menopausal bleeding. The patient has never been on hormonal therapy. The patient wears seatbelts: yes. The patient participates in regular exercise: yes.    The patient has the following complaints/concerns today:  1. None  Gynecologic History  Menarche age: 32 Patient's last menstrual period was 05/09/2016 (lmp unknown). Contraception: post menopausal status History of STI's: Denies Last Pap:06/2017. Results were: normal.  Denies h/o abnormal pap smears. Last mammogram: 07/2017.  Results were: normal.    OB History  Gravida Para Term Preterm AB Living  0 0 0 0 0 0  SAB TAB Ectopic Multiple Live Births  0 0 0 0 0    Past Medical History:  Diagnosis Date  . Anemia   . Anxiety   . COVID-19 virus infection   . Depression 2010  . Diabetes mellitus   . DKA, type 2 ([place]) 10/13/2019  . Endometriosis   . Headache, common migraine, intractable   . History of ovarian cyst    left sided, caused by her endometriosis (possibly an endometrioma), in her 13s.  Marland Kitchen Hyperlipidemia   . Hypertension   . Kidney stones    left  . Morbid obesity ([place])    s/p gastric bypass  . Personal hx of gastric bypass 2010  . Rosacea   . Vertigo     Past Surgical History:  Procedure Laterality Date  . DILATION AND CURETTAGE OF UTERUS  2011  . gastric bypass    . OVARIAN CYST REMOVAL    . ROTATOR CUFF REPAIR     right    Family History  Problem Relation Age of Onset  . Diabetes Mother   . Hypertension Mother   . Hypertension Father   . Diabetes Father   . Depression Other        strong hx of  . Breast cancer Paternal Grandmother 31  . Breast cancer Maternal Grandmother   . Cancer Neg Hx     Social History   Socioeconomic History  . Marital status: Married    Spouse name: Not on file  .  Number of children: Not on file  . Years of education: Not on file  . Highest education level: Not on file  Occupational History  . Not on file  Tobacco Use  . Smoking status: Former Smoker    Packs/day: 1.00    Years: 4.00    Pack years: 4.00    Types: Cigarettes    Quit date: 02/05/1996    Years since quitting: 24.2  . Smokeless tobacco: Never Used  Substance and Sexual Activity  . Alcohol use: No  . Drug use: No  . Sexual activity: Yes    Birth control/protection: None  Other Topics Concern  . Not on file  Social History Narrative  . Not on file   Social Determinants of Health   Financial Resource Strain:   . Difficulty of Paying Living Expenses:   Food Insecurity:   . Worried About Charity fundraiser in the Last Year:   . Arboriculturist in the Last Year:   Transportation Needs:   . Film/video editor (Medical):   Marland Kitchen Lack of Transportation (Non-Medical):   Physical Activity:   . Days of Exercise per Week:   . Minutes of Exercise per Session:   Stress:   .  Feeling of Stress :   Social Connections:   . Frequency of Communication with Friends and Family:   . Frequency of Social Gatherings with Friends and Family:   . Attends Religious Services:   . Active Member of Clubs or Organizations:   . Attends Archivist Meetings:   Marland Kitchen Marital Status:   Intimate Partner Violence:   . Fear of Current or Ex-Partner:   . Emotionally Abused:   Marland Kitchen Physically Abused:   . Sexually Abused:     Current Outpatient Medications on File Prior to Visit  Medication Sig Dispense Refill  . acetaminophen (TYLENOL) 325 MG tablet Take 650 mg by mouth every 6 (six) hours as needed for mild pain.     Marland Kitchen albuterol (VENTOLIN HFA) 108 (90 Base) MCG/ACT inhaler Inhale 2 puffs into the lungs every 6 (six) hours as needed for wheezing or shortness of breath. 18 g 0  . aspirin-acetaminophen-caffeine (EXCEDRIN MIGRAINE) 250-250-65 MG tablet Take 2 tablets by mouth every 6 (six) hours as  needed for headache.    Marland Kitchen atorvastatin (LIPITOR) 20 MG tablet TAKE 1 TABLET BY MOUTH EVERY DAY (Patient taking differently: Take 20 mg by mouth daily at 6 PM. ) 90 tablet 0  . azelastine (ASTELIN) 0.1 % nasal spray INSTILL 1 2 SPRAYS IN EACH NOSTRIL TWICE A DAY    . blood glucose meter kit and supplies KIT Dispense based on patient and insurance preference. Use up to four times daily as directed. (FOR ICD-E11.21) 1 each 0  . buPROPion (WELLBUTRIN XL) 150 MG 24 hr tablet Take 450 mg by mouth daily.   3  . Continuous Blood Gluc Receiver (FREESTYLE LIBRE 14 DAY READER) DEVI 1 applicator by Does not apply route 4 (four) times daily. 1 Device 1  . Continuous Blood Gluc Sensor (FREESTYLE LIBRE 14 DAY SENSOR) MISC Inject 1 application into the skin 4 (four) times daily as needed (uncontrolled diabetes). 2 each 11  . cyanocobalamin (,VITAMIN B-12,) 1000 MCG/ML injection INJECT 1 ML (1,000 MCG TOTAL) INTO THE MUSCLE ONCE A WEEK. FOR 4 WEEKS, THEN MONTHLY THEREAFTER 6 mL 4  . cyclobenzaprine (FLEXERIL) 10 MG tablet Take 5-10 mg by mouth 3 (three) times daily as needed for muscle spasms.   12  . diltiazem (CARDIZEM CD) 300 MG 24 hr capsule TAKE 1 CAPSULE BY MOUTH EVERY DAY 90 capsule 1  . EPINEPHrine 0.3 mg/0.3 mL IJ SOAJ injection Inject 0.3 mg into the muscle as needed for anaphylaxis.     Marland Kitchen escitalopram (LEXAPRO) 20 MG tablet Take 20 mg by mouth daily.     . fluticasone (FLONASE) 50 MCG/ACT nasal spray SPRAY 2 SPRAYS INTO EACH NOSTRIL EVERY DAY 48 mL 1  . HORIZANT 600 MG TBCR Take 1 tablet by mouth 2 (two) times daily.    . INS SYRINGE/NEEDLE 1CC/28G (B-D INS SYR MICROFINE 1CC/28G) 28G X 1/2" 1 ML MISC USE 1 SYRINGE DAILY AFTER SUPPER 100 each 3  . Insulin Glargine (BASAGLAR KWIKPEN) 100 UNIT/ML SOPN Inject 0.5 mLs (50 Units total) into the skin daily. 45 mL 3  . Insulin Pen Needle (BD PEN NEEDLE NANO U/F) 32G X 4 MM MISC Please specify directions, refills and quantity 100 each 11  . lamoTRIgine (LAMICTAL)  150 MG tablet Take 150 mg by mouth daily.    [name] Device MISC Test blood sugar two times daily 100 each 6  . levothyroxine (SYNTHROID) 50 MCG tablet Take 1 tablet (50 mcg total) by mouth daily. 90 tablet  3  . loperamide (IMODIUM) 2 MG capsule Take 2 capsules (4 mg total) by mouth as needed for diarrhea or loose stools. 30 capsule 0  . LORazepam (ATIVAN) 0.5 MG tablet Take 0.5-1 mg by mouth daily as needed.     . meclizine (ANTIVERT) 25 MG tablet Take 1 tablet (25 mg total) by mouth 3 (three) times daily as needed for dizziness. 90 tablet 6  . NOVOLOG FLEXPEN 100 UNIT/ML FlexPen INJECT 20 UNITS INTO THE SKIN 3 TIMES DAILY WITH MEALS 15 mL 2  . NURTEC 75 MG TBDP Take 1 tablet by mouth daily as needed.    . nystatin (MYCOSTATIN/NYSTOP) powder APPLY 1 APPLICATION TO AFFECTED AREA TWICE A DAY 60 g 0  . OnabotulinumtoxinA (BOTOX IJ) Inject as directed every 3 (three) months. For migraine    . SUMAtriptan (IMITREX) 100 MG tablet Take 100 mg by mouth every 2 (two) hours as needed for migraine. May repeat in 2 hours if headache persists or recurs.    Marland Kitchen SYRINGE-NEEDLE, DISP, 3 ML (BD INTEGRA SYRINGE) 25G X 1" 3 ML MISC FOR USE WITH B12 INJECTIONS WEEKLY/MONTHLY 25 each 0  . Vitamin D, Ergocalciferol, (DRISDOL) 1.25 MG (50000 UNIT) CAPS capsule TAKE 1 CAPSULE BY MOUTH ONE TIME PER WEEK 12 capsule 1  . fluocinonide cream (LIDEX) 0.05 % Apply sparingly up to 2 times a day-only on the  rash (Patient not taking: Reported on 04/13/2020) 120 g 0  . nitrofurantoin, macrocrystal-monohydrate, (MACROBID) 100 MG capsule Take 1 capsule (100 mg total) by mouth 2 (two) times daily. (Patient not taking: Reported on 04/13/2020) 10 capsule 0  . oxyCODONE-acetaminophen (PERCOCET) 10-325 MG tablet Take 1 tablet by mouth every 6 (six) hours as needed for pain.    . traMADol (ULTRAM) 50 MG tablet TAKE ONE TABLET BY MOUTH EVERY 6 HOURS AS NEEDED FOR RELIEF FROM PAIN (Patient not taking: Reported on 04/13/2020) 90 tablet 1   No  current facility-administered medications on file prior to visit.    Allergies  Allergen Reactions  . Amoxicillin Hives and Rash  . Avocado Swelling  . Clarithromycin Hives and Rash  . Honey Anaphylaxis  . Other Hives and Swelling    All melon  . Sulfa Drugs Cross Reactors Anaphylaxis  . Watermelon Concentrate Hives and Swelling    Oral swelling, ears swell shut  . Metoprolol Other (See Comments)    Hypotension  . Adhesive [Tape] Itching    Skin breakdown  . Levemir [Insulin Detemir] Rash    Red/swollen injection site reactions with residual lumps  . Victoza [Liraglutide] Nausea Only    Review of Systems Constitutional: negative for chills, fatigue, fevers and sweats Eyes: negative for irritation, redness and visual disturbance Ears, nose, mouth, throat, and face: negative for hearing loss, nasal congestion, snoring and tinnitus Respiratory: negative for asthma, cough, sputum Cardiovascular: negative for chest pain, dyspnea, exertional chest pressure/discomfort, irregular heart beat, palpitations and syncope Gastrointestinal: negative for abdominal pain, change in bowel habits, nausea and vomiting Genitourinary: negative for abnormal menstrual periods, genital lesions, sexual problems and vaginal discharge, irritation, dysuria and urinary incontinence.   Integument/breast: negative for breast lump, breast tenderness and nipple discharge Hematologic/lymphatic: negative for bleeding and easy bruising Musculoskeletal:negative for back pain and muscle weakness Neurological: negative for dizziness, headaches, vertigo and weakness Endocrine: negative for diabetic symptoms including polydipsia, polyuria and skin dryness Allergic/Immunologic: negative for hay fever and urticaria       Objective:  Blood pressure (!) 164/99, pulse (!) 137, height 5'  4" (1.626 m), weight 236 lb 1.6 oz (107.1 kg), last menstrual period 05/09/2016. Body mass index is 40.53 kg/m.  General Appearance:     Alert, cooperative, no distress, appears stated age, morbid obesity  Head:    Normocephalic, without obvious abnormality, atraumatic  Eyes:    PERRL, conjunctiva/corneas clear, EOM's intact, both eyes  Ears:    Normal external ear canals, both ears  Nose:   Nares normal, septum midline, mucosa normal, no drainage or sinus tenderness  Throat:   Lips, mucosa, and tongue normal; teeth and gums normal  Neck:   Supple, symmetrical, trachea midline, no adenopathy; thyroid: no enlargement/tenderness/nodules; no carotid bruit or JVD  Back:     Symmetric, no curvature, ROM normal, no CVA tenderness  Lungs:     Clear to auscultation bilaterally, respirations unlabored  Chest Wall:    No tenderness or deformity   Heart:    Tachycardic, regular rhythm, S1 and S2 normal, no murmur, rub or gallop  Breast Exam:    No tenderness, masses, or nipple abnormality  Abdomen:     Soft, non-tender, bowel sounds active all four quadrants, no masses, no organomegaly.    Genitalia:    Pelvic:external genitalia normal, vagina without lesions, discharge, or tenderness, rectovaginal septum  normal. Cervix normal in appearance, no cervical motion tenderness. Difficult to palpate adnexa due to patient body habitus, no masses palpated, no tenderness.  Uterus normal size, shape, mobile, nontender.  Rectal:    Normal external sphincter.  No hemorrhoids appreciated. Internal exam not done.   Extremities:   Extremities normal, atraumatic, no cyanosis or edema  Pulses:   2+ and symmetric all extremities  Skin:   Skin color, texture, turgor normal, no rashes or lesions  Lymph nodes:   Cervical, supraclavicular, and axillary nodes normal  Neurologic:   CNII-XII intact, normal strength, sensation and reflexes throughout   .  Labs:  Lab Results  Component Value Date   WBC 9.9 03/26/2020   HGB 12.2 03/26/2020   HCT 39.9 03/26/2020   MCV 98.0 03/26/2020   PLT 402 (H) 03/26/2020    Lab Results  Component Value Date    CREATININE 1.21 (H) 03/26/2020   BUN 24 (H) 03/26/2020   NA 137 03/26/2020   K 4.9 03/26/2020   CL 101 03/26/2020   CO2 26 03/26/2020    Lab Results  Component Value Date   ALT 29 03/26/2020   AST 22 03/26/2020   ALKPHOS 104 03/26/2020   BILITOT 0.6 03/26/2020    Lab Results  Component Value Date   TSH 2.06 03/11/2020    Lab Results  Component Value Date   CHOL 194 09/19/2019   HDL 48 (L) 09/19/2019   LDLCALC 94 09/19/2019   LDLDIRECT 92.0 02/21/2017   TRIG 206 (H) 10/20/2019   CHOLHDL 4.0 09/19/2019        Assessment:   Healthy female exam.  Morbid obesity Post-menopausal state H/o ovarian cyst (left) Multiple comorbidites (DM, HTN)  Plan:     Blood tests: None needed. Completed with PCP.  Breast self exam technique reviewed and patient encouraged to perform self-exam monthly. Contraception: post menopausal status. Discussed healthy lifestyle modifications.  Has a h/o bariatric surgery in the past. Continued to encourage healthy diet and exercise. Patient states she is walking more, trying to increase her exercise capacity since having COVID in November 2020.  Mammogram ordered. No prior screening for colorectal cancer - patient states will arrange with PCP.  Pap smear performed today.  Continue routine screening.  Postmenopausal state, currently denies menopausal symptoms. Multiple comorbidities, managed by PCP.  Patient does report BP is likely high today as she has forgotten to take her medications over the past 2-3 days. Encouraged better compliance with medications.  Follow up in 1-2 years for well woman exam, or as needed.      I have seen and examined the patient with Blair Heys, [place] PA-S.  I have reviewed the record and concur with patient management and plan.   Rubie Maid, MD [facility]    Rubie Maid, MD [facility]

## 2020-04-13 NOTE — Progress Notes (Signed)
Pt present for annual exam. Pt stated that she was doing well no problems. First bp check 164/99 p 137. 2nd check bp 137/102 p 134.

## 2020-04-13 NOTE — Patient Instructions (Signed)
Preventive Care 40-50 Years Old, Female Preventive care refers to visits with your health care provider and lifestyle choices that can promote health and wellness. This includes:  A yearly physical exam. This may also be called an annual well check.  Regular dental visits and eye exams.  Immunizations.  Screening for certain conditions.  Healthy lifestyle choices, such as eating a healthy diet, getting regular exercise, not using drugs or products that contain nicotine and tobacco, and limiting alcohol use. What can I expect for my preventive care visit? Physical exam Your health care provider will check your:  Height and weight. This may be used to calculate body mass index (BMI), which tells if you are at a healthy weight.  Heart rate and blood pressure.  Skin for abnormal spots. Counseling Your health care provider may ask you questions about your:  Alcohol, tobacco, and drug use.  Emotional well-being.  Home and relationship well-being.  Sexual activity.  Eating habits.  Work and work environment.  Method of birth control.  Menstrual cycle.  Pregnancy history. What immunizations do I need?  Influenza (flu) vaccine  This is recommended every year. Tetanus, diphtheria, and pertussis (Tdap) vaccine  You may need a Td booster every 10 years. Varicella (chickenpox) vaccine  You may need this if you have not been vaccinated. Zoster (shingles) vaccine  You may need this after age 60. Measles, mumps, and rubella (MMR) vaccine  You may need at least one dose of MMR if you were born in 1957 or later. You may also need a second dose. Pneumococcal conjugate (PCV13) vaccine  You may need this if you have certain conditions and were not previously vaccinated. Pneumococcal polysaccharide (PPSV23) vaccine  You may need one or two doses if you smoke cigarettes or if you have certain conditions. Meningococcal conjugate (MenACWY) vaccine  You may need this if you  have certain conditions. Hepatitis A vaccine  You may need this if you have certain conditions or if you travel or work in places where you may be exposed to hepatitis A. Hepatitis B vaccine  You may need this if you have certain conditions or if you travel or work in places where you may be exposed to hepatitis B. Haemophilus influenzae type b (Hib) vaccine  You may need this if you have certain conditions. Human papillomavirus (HPV) vaccine  If recommended by your health care provider, you may need three doses over 6 months. You may receive vaccines as individual doses or as more than one vaccine together in one shot (combination vaccines). Talk with your health care provider about the risks and benefits of combination vaccines. What tests do I need? Blood tests  Lipid and cholesterol levels. These may be checked every 5 years, or more frequently if you are over 50 years old.  Hepatitis C test.  Hepatitis B test. Screening  Lung cancer screening. You may have this screening every year starting at age 55 if you have a 30-pack-year history of smoking and currently smoke or have quit within the past 15 years.  Colorectal cancer screening. All adults should have this screening starting at age 50 and continuing until age 75. Your health care provider may recommend screening at age 45 if you are at increased risk. You will have tests every 1-10 years, depending on your results and the type of screening test.  Diabetes screening. This is done by checking your blood sugar (glucose) after you have not eaten for a while (fasting). You may have this   done every 1-3 years.  Mammogram. This may be done every 1-2 years. Talk with your health care provider about when you should start having regular mammograms. This may depend on whether you have a family history of breast cancer.  BRCA-related cancer screening. This may be done if you have a family history of breast, ovarian, tubal, or peritoneal  cancers.  Pelvic exam and Pap test. This may be done every 3 years starting at age 60. Starting at age 7, this may be done every 5 years if you have a Pap test in combination with an HPV test. Other tests  Sexually transmitted disease (STD) testing.  Bone density scan. This is done to screen for osteoporosis. You may have this scan if you are at high risk for osteoporosis. Follow these instructions at home: Eating and drinking  Eat a diet that includes fresh fruits and vegetables, whole grains, lean protein, and low-fat dairy.  Take vitamin and mineral supplements as recommended by your health care provider.  Do not drink alcohol if: ? Your health care provider tells you not to drink. ? You are pregnant, may be pregnant, or are planning to become pregnant.  If you drink alcohol: ? Limit how much you have to 0-1 drink a day. ? Be aware of how much alcohol is in your drink. In the [place] one drink equals one 12 oz bottle of beer (355 mL), one 5 oz glass of wine (148 mL), or one 1 oz glass of hard liquor (44 mL). Lifestyle  Take daily care of your teeth and gums.  Stay active. Exercise for at least 30 minutes on 5 or more days each week.  Do not use any products that contain nicotine or tobacco, such as cigarettes, e-cigarettes, and chewing tobacco. If you need help quitting, ask your health care provider.  If you are sexually active, practice safe sex. Use a condom or other form of birth control (contraception) in order to prevent pregnancy and STIs (sexually transmitted infections).  If told by your health care provider, take low-dose aspirin daily starting at age 48. What's next?  Visit your health care provider once a year for a well check visit.  Ask your health care provider how often you should have your eyes and teeth checked.  Stay up to date on all vaccines. This information is not intended to replace advice given to you by your health care provider. Make sure you  discuss any questions you have with your health care provider. Document Revised: 08/08/2018 Document Reviewed: 08/08/2018 Elsevier Patient Education  2020 [place] Breast self-awareness is knowing how your breasts look and feel. Doing breast self-awareness is important. It allows you to catch a breast problem early while it is still small and can be treated. All women should do breast self-awareness, including women who have had breast implants. Tell your doctor if you notice a change in your breasts. What you need:  A mirror.  A well-lit room. How to do a breast self-exam A breast self-exam is one way to learn what is normal for your breasts and to check for changes. To do a breast self-exam: Look for changes  1. Take off all the clothes above your waist. 2. Stand in front of a mirror in a room with good lighting. 3. Put your hands on your hips. 4. Push your hands down. 5. Look at your breasts and nipples in the mirror to see if one breast or nipple looks different from the  other. Check to see if: ? The shape of one breast is different. ? The size of one breast is different. ? There are wrinkles, dips, and bumps in one breast and not the other. 6. Look at each breast for changes in the skin, such as: ? Redness. ? Scaly areas. 7. Look for changes in your nipples, such as: ? Liquid around the nipples. ? Bleeding. ? Dimpling. ? Redness. ? A change in where the nipples are. Feel for changes  1. Lie on your back on the floor. 2. Feel each breast. To do this, follow these steps: ? Pick a breast to feel. ? Put the arm closest to that breast above your head. ? Use your other arm to feel the nipple area of your breast. Feel the area with the pads of your three middle fingers by making small circles with your fingers. For the first circle, press lightly. For the second circle, press harder. For the third circle, press even harder. ? Keep making circles with  your fingers at the different pressures as you move down your breast. Stop when you feel your ribs. ? Move your fingers a little toward the center of your body. ? Start making circles with your fingers again, this time going up until you reach your collarbone. ? Keep making up-and-down circles until you reach your armpit. Remember to keep using the three pressures. ? Feel the other breast in the same way. 3. Sit or stand in the tub or shower. 4. With soapy water on your skin, feel each breast the same way you did in step 2 when you were lying on the floor. Write down what you find Writing down what you find can help you remember what to tell your doctor. Write down:  What is normal for each breast.  Any changes you find in each breast, including: ? The kind of changes you find. ? Whether you have pain. ? Size and location of any lumps.  When you last had your menstrual period. General tips  Check your breasts every month.  If you are breastfeeding, the best time to check your breasts is after you feed your baby or after you use a breast pump.  If you get menstrual periods, the best time to check your breasts is 5-7 days after your menstrual period is over.  With time, you will become comfortable with the self-exam, and you will begin to know if there are changes in your breasts. Contact a doctor if you:  See a change in the shape or size of your breasts or nipples.  See a change in the skin of your breast or nipples, such as red or scaly skin.  Have fluid coming from your nipples that is not normal.  Find a lump or thick area that was not there before.  Have pain in your breasts.  Have any concerns about your breast health. Summary  Breast self-awareness includes looking for changes in your breasts, as well as feeling for changes within your breasts.  Breast self-awareness should be done in front of a mirror in a well-lit room.  You should check your breasts every month.  If you get menstrual periods, the best time to check your breasts is 5-7 days after your menstrual period is over.  Let your doctor know of any changes you see in your breasts, including changes in size, changes on the skin, pain or tenderness, or fluid from your nipples that is not normal. This information is not  intended to replace advice given to you by your health care provider. Make sure you discuss any questions you have with your health care provider. Document Revised: 07/16/2018 Document Reviewed: 07/16/2018 Elsevier Patient Education  [place].

## 2020-04-15 ENCOUNTER — Encounter: Payer: Self-pay | Admitting: Obstetrics and Gynecology

## 2020-04-20 LAB — CYTOLOGY - PAP
Comment: NEGATIVE
Diagnosis: NEGATIVE
High risk HPV: NEGATIVE

## 2020-05-07 ENCOUNTER — Other Ambulatory Visit: Payer: Self-pay | Admitting: Internal Medicine

## 2020-05-10 ENCOUNTER — Other Ambulatory Visit: Payer: Self-pay | Admitting: Internal Medicine

## 2020-05-17 ENCOUNTER — Other Ambulatory Visit: Payer: Self-pay

## 2020-05-17 ENCOUNTER — Other Ambulatory Visit: Payer: Self-pay | Admitting: Orthopedic Surgery

## 2020-05-17 ENCOUNTER — Emergency Department
Admission: EM | Admit: 2020-05-17 | Discharge: 2020-05-17 | Disposition: A | Discharge status: Home / Self Care | Payer: BC Managed Care – PPO | Attending: Emergency Medicine | Admitting: Emergency Medicine

## 2020-05-17 DIAGNOSIS — R531 Weakness: Secondary | ICD-10-CM

## 2020-05-17 DIAGNOSIS — R42 Dizziness and giddiness: Secondary | ICD-10-CM | POA: Insufficient documentation

## 2020-05-17 DIAGNOSIS — E119 Type 2 diabetes mellitus without complications: Secondary | ICD-10-CM | POA: Insufficient documentation

## 2020-05-17 DIAGNOSIS — I959 Hypotension, unspecified: Secondary | ICD-10-CM

## 2020-05-17 DIAGNOSIS — E860 Dehydration: Secondary | ICD-10-CM | POA: Diagnosis not present

## 2020-05-17 DIAGNOSIS — R519 Headache, unspecified: Secondary | ICD-10-CM | POA: Diagnosis not present

## 2020-05-17 LAB — CBC WITH DIFFERENTIAL/PLATELET
Abs Immature Granulocytes: 0.05 10*3/uL (ref 0.00–0.07)
Basophils Absolute: 0 10*3/uL (ref 0.0–0.1)
Basophils Relative: 0 %
Eosinophils Absolute: 0.1 10*3/uL (ref 0.0–0.5)
Eosinophils Relative: 1 %
HCT: 35.9 % — ABNORMAL LOW (ref 36.0–46.0)
Hemoglobin: 11.1 g/dL — ABNORMAL LOW (ref 12.0–15.0)
Immature Granulocytes: 0 %
Lymphocytes Relative: 30 %
Lymphs Abs: 3.9 10*3/uL (ref 0.7–4.0)
MCH: 29.2 pg (ref 26.0–34.0)
MCHC: 30.9 g/dL (ref 30.0–36.0)
MCV: 94.5 fL (ref 80.0–100.0)
Monocytes Absolute: 0.9 10*3/uL (ref 0.1–1.0)
Monocytes Relative: 7 %
Neutro Abs: 8.1 10*3/uL — ABNORMAL HIGH (ref 1.7–7.7)
Neutrophils Relative %: 62 %
Platelets: 452 10*3/uL — ABNORMAL HIGH (ref 150–400)
RBC: 3.8 MIL/uL — ABNORMAL LOW (ref 3.87–5.11)
RDW: 14.2 % (ref 11.5–15.5)
WBC: 13 10*3/uL — ABNORMAL HIGH (ref 4.0–10.5)
nRBC: 0 % (ref 0.0–0.2)

## 2020-05-17 LAB — URINALYSIS, COMPLETE (UACMP) WITH MICROSCOPIC
Bilirubin Urine: NEGATIVE
Glucose, UA: NEGATIVE mg/dL
Hgb urine dipstick: NEGATIVE
Ketones, ur: NEGATIVE mg/dL
Nitrite: NEGATIVE
Protein, ur: NEGATIVE mg/dL
Specific Gravity, Urine: 1.021 (ref 1.005–1.030)
pH: 5 (ref 5.0–8.0)

## 2020-05-17 LAB — COMPREHENSIVE METABOLIC PANEL
ALT: 44 U/L (ref 0–44)
AST: 39 U/L (ref 15–41)
Albumin: 2 g/dL — ABNORMAL LOW (ref 3.5–5.0)
Alkaline Phosphatase: 108 U/L (ref 38–126)
Anion gap: 10 (ref 5–15)
BUN: 28 mg/dL — ABNORMAL HIGH (ref 6–20)
CO2: 24 mmol/L (ref 22–32)
Calcium: 7.5 mg/dL — ABNORMAL LOW (ref 8.9–10.3)
Chloride: 104 mmol/L (ref 98–111)
Creatinine, Ser: 1.24 mg/dL — ABNORMAL HIGH (ref 0.44–1.00)
GFR calc Af Amer: 59 mL/min — ABNORMAL LOW (ref >60)
GFR calc non Af Amer: 51 mL/min — ABNORMAL LOW (ref >60)
Glucose, Bld: 109 mg/dL — ABNORMAL HIGH (ref 70–99)
Potassium: 4.6 mmol/L (ref 3.5–5.1)
Sodium: 138 mmol/L (ref 135–145)
Total Bilirubin: 0.5 mg/dL (ref 0.3–1.2)
Total Protein: 4.7 g/dL — ABNORMAL LOW (ref 6.5–8.1)

## 2020-05-17 LAB — LACTIC ACID, PLASMA
Lactic Acid, Venous: 2.5 mmol/L (ref 0.5–1.9)
Lactic Acid, Venous: 2.3 mmol/L (ref 0.5–1.9)

## 2020-05-17 LAB — TROPONIN I (HIGH SENSITIVITY): Troponin I (High Sensitivity): 10 ng/L (ref <18)

## 2020-05-17 MED ORDER — SODIUM CHLORIDE 0.9 % IV SOLN: Freq: Once | INTRAVENOUS | Status: AC

## 2020-05-17 MED ORDER — CALCIUM GLUCONATE-NACL 1-0.675 GM/50ML-% IV SOLN
1.0000 g | Freq: Once | INTRAVENOUS | Status: AC
  Administered 2020-05-17: 1000 mg via INTRAVENOUS
  Filled 2020-05-17: qty 50

## 2020-05-17 NOTE — ED Provider Notes (Signed)
ER Provider Note       Time seen: 1:49 PM    I have reviewed the vital signs and the nursing notes.  HISTORY   Chief Complaint No chief complaint on file.   HPI Peytyn Trine is a 49 y.o. female with a history of anemia, anxiety, severe COVID-19, depression, diabetes, DKA, hyperlipidemia, hypertension, morbid obesity status post gastric bypass who presents today for low blood pressure with dizziness, lightheadedness and headache.  She has had near syncopal events.  Last blood pressure in route was low, reports she had hypotension in the past after gastric bypass and weight loss.  Past Medical History:  Diagnosis Date  . Anemia   . Anxiety   . COVID-19 virus infection   . Depression 2010  . Diabetes mellitus   . DKA, type 2 ([place]) 10/13/2019  . Endometriosis   . Headache, common migraine, intractable   . History of ovarian cyst    left sided, caused by her endometriosis (possibly an endometrioma), in her 46s.  Marland Kitchen Hyperlipidemia   . Hypertension   . Kidney stones    left  . Morbid obesity ([place])    s/p gastric bypass  . Personal hx of gastric bypass 2010  . Rosacea   . Vertigo     Past Surgical History:  Procedure Laterality Date  . DILATION AND CURETTAGE OF UTERUS  2011  . gastric bypass    . OVARIAN CYST REMOVAL    . ROTATOR CUFF REPAIR     right    Allergies Amoxicillin, Avocado, Clarithromycin, Honey, Other, Sulfa drugs cross reactors, Watermelon concentrate, Metoprolol, Adhesive [tape], Levemir [insulin detemir], and Victoza [liraglutide]   Review of Systems Constitutional: Negative for fever. Cardiovascular: Negative for chest pain. Respiratory: Negative for shortness of breath. Gastrointestinal: Negative for abdominal pain, vomiting and diarrhea. Musculoskeletal: Negative for back pain. Skin: Negative for rash. Neurological: Positive for weakness  All systems negative/normal/unremarkable except as stated in the  HPI  ____________________________________________   PHYSICAL EXAM:  VITAL SIGNS: There were no vitals filed for this visit.  Constitutional: Alert and oriented. Well appearing and in no distress. Eyes: Conjunctivae are normal. Normal extraocular movements. ENT      Head: Normocephalic and atraumatic.      Nose: No congestion/rhinnorhea.      Mouth/Throat: Mucous membranes are moist.      Neck: No stridor. Cardiovascular: Normal rate, regular rhythm. No murmurs, rubs, or gallops. Respiratory: Normal respiratory effort without tachypnea nor retractions. Breath sounds are clear and equal bilaterally. No wheezes/rales/rhonchi. Gastrointestinal: Soft and nontender. Normal bowel sounds Musculoskeletal: Nontender with normal range of motion in extremities. No lower extremity tenderness nor edema. Neurologic:  Normal speech and language. No gross focal neurologic deficits are appreciated.  Skin:  Skin is warm, dry and intact. No rash noted. Psychiatric: Speech and behavior are normal.  ____________________________________________  EKG: Interpreted by me. Sinus rhythm with rate of 60 bpm, LVH, leftward axis, normal QT  ____________________________________________   LABS (pertinent positives/negatives)  Labs Reviewed  CBC WITH DIFFERENTIAL/PLATELET - Abnormal; Notable for the following components:      Result Value   WBC 13.0 (*)    RBC 3.80 (*)    Hemoglobin 11.1 (*)    HCT 35.9 (*)    Platelets 452 (*)    Neutro Abs 8.1 (*)    All other components within normal limits  COMPREHENSIVE METABOLIC PANEL - Abnormal; Notable for the following components:   Glucose, Bld 109 (*)  BUN 28 (*)    Creatinine, Ser 1.24 (*)    Calcium 7.5 (*)    Total Protein 4.7 (*)    Albumin 2.0 (*)    GFR calc non Af Amer 51 (*)    GFR calc Af Amer 59 (*)    All other components within normal limits  CULTURE, BLOOD (ROUTINE X 2)  CULTURE, BLOOD (ROUTINE X 2)  URINALYSIS, COMPLETE (UACMP) WITH  MICROSCOPIC  LACTIC ACID, PLASMA  LACTIC ACID, PLASMA  CBG MONITORING, ED  TROPONIN I (HIGH SENSITIVITY)   CRITICAL CARE Performed by: Laurence Aly   Total critical care time: 30 minutes  Critical care time was exclusive of separately billable procedures and treating other patients.  Critical care was necessary to treat or prevent imminent or life-threatening deterioration.  Critical care was time spent personally by me on the following activities: development of treatment plan with patient and/or surrogate as well as nursing, discussions with consultants, evaluation of patient's response to treatment, examination of patient, obtaining history from patient or surrogate, ordering and performing treatments and interventions, ordering and review of laboratory studies, ordering and review of radiographic studies, pulse oximetry and re-evaluation of patient's condition.  DIFFERENTIAL DIAGNOSIS  Dehydration, orthostasis, arrhythmia, MI, sepsis  ASSESSMENT AND PLAN  Hypotension   Plan: The patient had presented for hypotension and dizziness.  She was given IV fluid boluses on arrival due to severe hypotension.  Patient's labs were grossly unremarkable for her.  This may be secondary to taking Zanaflex as dictated above.  She is pending further lab work and observation.  Lenise Arena MD    Note: This note was generated in part or whole with voice recognition software. Voice recognition is usually quite accurate but there are transcription errors that can and very often do occur. I apologize for any typographical errors that were not detected and corrected.     Earleen Newport, MD 05/17/20 [phone]

## 2020-05-17 NOTE — ED Triage Notes (Signed)
Pt arrives via [facility] from work for reports of low BP, dizziness, lightheadedness and headache. Pt reports she was at work and started to see spots. Pt speech slow but clear, A&Ox4. 500cc bolus NS given in route. Last BP with EMS 84/51.

## 2020-05-17 NOTE — Discharge Instructions (Addendum)
As we discussed please drink plenty of fluids over the next 24 hours.  Please follow-up with your doctor tomorrow for recheck/reevaluation.  Return to the emergency department for any further episodes of low blood pressure, fever, or any other symptom personally concerning to yourself.

## 2020-05-17 NOTE — ED Provider Notes (Signed)
-----------------------------------------   5:29 PM on 05/17/2020 -----------------------------------------  Patient's lactate is slightly elevated still but decreasing down to 2.3.  Patient's work-up is otherwise nonrevealing.  Blood cultures have been sent.  Denies any dysuria.  No cough or shortness of breath.  States she has been experiencing migraines but this is fairly typical for her.  She has not been drinking as much over the last few days.  Lab work does reflect some degree of dehydration.  Patient's blood pressure is improved and has remained improved after fluids.  Patient states she feels much better.  Denied any fever at any point.  Patient wishes to go home.  I believe this is a reasonable plan of care.  I discussed with the patient increasing oral hydration and following up with her doctor.  Patient agreeable to plan of care.  I discussed strict return precautions.   Harvest Dark, MD 05/17/20 1731

## 2020-05-18 ENCOUNTER — Encounter: Payer: Self-pay | Admitting: Internal Medicine

## 2020-05-18 ENCOUNTER — Other Ambulatory Visit: Payer: Self-pay

## 2020-05-18 ENCOUNTER — Ambulatory Visit (INDEPENDENT_AMBULATORY_CARE_PROVIDER_SITE_OTHER): Payer: BC Managed Care – PPO | Admitting: Internal Medicine

## 2020-05-18 VITALS — BP 164/92 | HR 119 | Temp 97.1°F | Resp 17 | Ht 64.0 in | Wt 249.0 lb

## 2020-05-18 DIAGNOSIS — E872 Acidosis, unspecified: Secondary | ICD-10-CM

## 2020-05-18 DIAGNOSIS — K529 Noninfective gastroenteritis and colitis, unspecified: Secondary | ICD-10-CM | POA: Diagnosis not present

## 2020-05-18 DIAGNOSIS — R140 Abdominal distension (gaseous): Secondary | ICD-10-CM

## 2020-05-18 DIAGNOSIS — R197 Diarrhea, unspecified: Secondary | ICD-10-CM | POA: Diagnosis not present

## 2020-05-18 DIAGNOSIS — R1907 Generalized intra-abdominal and pelvic swelling, mass and lump: Secondary | ICD-10-CM | POA: Diagnosis not present

## 2020-05-18 MED ORDER — FUROSEMIDE 20 MG PO TABS
20.0000 mg | ORAL_TABLET | Freq: Every day | ORAL | 3 refills | Status: DC
Start: 2020-05-18 — End: 2020-06-15

## 2020-05-18 MED ORDER — METOLAZONE 2.5 MG PO TABS: ORAL_TABLET | ORAL | 3 refills | Status: DC

## 2020-05-18 NOTE — Patient Instructions (Addendum)
Resume the lower dose of cardizem at 240 mg daily dose In the evening   I am prescribing 2 fluid pills to take once daily  To reduce abdominal swelling  Stool studues are to rule out infection and malabsorption    GI referral for colonoscopy

## 2020-05-18 NOTE — Assessment & Plan Note (Addendum)
Accompanied by nausea, anorexia,  Fluid retention and reports of increased gas.  No severe pain or cramping.  abd [place] to rule out ascites.  Zaroxolyn/furosemide prescribed.  Will need BMET one week .

## 2020-05-18 NOTE — Assessment & Plan Note (Addendum)
Malabsorption suspected,  However she has gained weight.  Fecal fat and GI pathogen panel ordered.  GI referral in process for diagnostic colonoscopy. May need rifaximin

## 2020-05-18 NOTE — Progress Notes (Signed)
Subjective:  Patient ID: Maria Bentley, female    DOB: 1971-08-31  Age: 49 y.o. MRN: [id number]  CC: The primary encounter diagnosis was Abdominal swelling, generalized. Diagnoses of Secretory diarrhea, Diarrhea, unspecified type, Abdominal distension, and Lactic acidosis were also pertinent to this visit.  HPI Maria Bentley presents for ER follow up,  Intermittent diarrhea since her COVID infection  This visit occurred during the SARS-CoV-2 public health emergency.  Safety protocols were in place, including screening questions prior to the visit, additional usage of staff PPE, and extensive cleaning of exam room while observing appropriate contact time as indicated for disinfecting solutions.    Patient has received both doses of the available COVID 19 vaccine without complications.  Patient continues to mask when outside of the home except when walking in yard or at safe distances from others .  Patient denies any change in mood or development of unhealthy behaviors resuting from the pandemic's restriction of activities and socialization.    Taken to ER yesterday by EMS for severe hypotension reportedly 60/40 with presyncope and vision changes, profound weakness. Symptoms were present at home but tolerated until she got to work around 10 am,  And were triggered by several episodes of diarrhea  over the weekend  She has been having recurrent bouts of diarrhea since November.  The episodes resolve for a few days after Imodium;  She will have a solid or soft  stool for a day or two but then the diarrhea returns and all stools are loose . The episodes are also occurring at night and waking her from sleep.  She denies fecal urgency without blood or mucus   Paradoxically she reports  a weight gain of 20 lbs over the last month despite eating less dur to persistent nausea.  Her abdominal girth has increased dramatically , but she denies orthopnea .  She is scheduled for a preoperative  evaluation by her orthopedist today for anticipated shoulder surgery in one week but does not feel well enough to proceed with this plan.   In the ER she was noted to have a lactic acidosis  And a normal bicarb on CMET.  No ABG was done.  She was given IV fluids and repeat lactic acid was lower but still elevated.  . WBC was 13.0 with a left shift.  CR was 1.24 elevated,  And albumin and calcium were both low. She was given IV fluids,  Calcium gluconate,  And She felt well enough to go home and was discharged home and told to follow up with PCP   Outpatient Medications Prior to Visit  Medication Sig Dispense Refill  . acetaminophen (TYLENOL) 325 MG tablet Take 650 mg by mouth every 6 (six) hours as needed for mild pain.     Marland Kitchen albuterol (VENTOLIN HFA) 108 (90 Base) MCG/ACT inhaler Inhale 2 puffs into the lungs every 6 (six) hours as needed for wheezing or shortness of breath. 18 g 0  . aspirin-acetaminophen-caffeine (EXCEDRIN MIGRAINE) 250-250-65 MG tablet Take 2 tablets by mouth every 6 (six) hours as needed for headache.    Marland Kitchen atorvastatin (LIPITOR) 20 MG tablet TAKE 1 TABLET BY MOUTH EVERY DAY (Patient taking differently: Take 20 mg by mouth daily at 6 PM. ) 90 tablet 0  . azelastine (ASTELIN) 0.1 % nasal spray INSTILL 1 2 SPRAYS IN EACH NOSTRIL TWICE A DAY    . blood glucose meter kit and supplies KIT Dispense based on patient and insurance preference. Use up  to four times daily as directed. (FOR ICD-E11.21) 1 each 0  . buPROPion (WELLBUTRIN XL) 150 MG 24 hr tablet Take 450 mg by mouth daily.   3  . Continuous Blood Gluc Receiver (FREESTYLE LIBRE 14 DAY READER) DEVI 1 applicator by Does not apply route 4 (four) times daily. 1 Device 1  . Continuous Blood Gluc Sensor (FREESTYLE LIBRE 14 DAY SENSOR) MISC Inject 1 application into the skin 4 (four) times daily as needed (uncontrolled diabetes). 2 each 11  . cyanocobalamin (,VITAMIN B-12,) 1000 MCG/ML injection INJECT 1 ML (1,000 MCG TOTAL) INTO THE  MUSCLE ONCE A WEEK. FOR 4 WEEKS, THEN MONTHLY THEREAFTER 6 mL 4  . cyclobenzaprine (FLEXERIL) 10 MG tablet Take 5-10 mg by mouth 3 (three) times daily as needed for muscle spasms.   12  . diltiazem (CARDIZEM CD) 300 MG 24 hr capsule TAKE 1 CAPSULE BY MOUTH EVERY DAY 90 capsule 1  . EPINEPHrine 0.3 mg/0.3 mL IJ SOAJ injection Inject 0.3 mg into the muscle as needed for anaphylaxis.     Marland Kitchen escitalopram (LEXAPRO) 20 MG tablet Take 20 mg by mouth daily.     . fluocinonide cream (LIDEX) 0.05 % Apply sparingly up to 2 times a day-only on the  rash 120 g 0  . fluticasone (FLONASE) 50 MCG/ACT nasal spray SPRAY 2 SPRAYS INTO EACH NOSTRIL EVERY DAY 48 mL 1  . HORIZANT 600 MG TBCR Take 1 tablet by mouth 2 (two) times daily.    . INS SYRINGE/NEEDLE 1CC/28G (B-D INS SYR MICROFINE 1CC/28G) 28G X 1/2" 1 ML MISC USE 1 SYRINGE DAILY AFTER SUPPER 100 each 3  . Insulin Glargine (BASAGLAR KWIKPEN) 100 UNIT/ML SOPN Inject 0.5 mLs (50 Units total) into the skin daily. 45 mL 3  . Insulin Pen Needle (BD PEN NEEDLE NANO U/F) 32G X 4 MM MISC Please specify directions, refills and quantity 100 each 11  . lamoTRIgine (LAMICTAL) 150 MG tablet Take 150 mg by mouth daily.    [name] Device MISC Test blood sugar two times daily 100 each 6  . levothyroxine (SYNTHROID) 50 MCG tablet Take 1 tablet (50 mcg total) by mouth daily. 90 tablet 3  . loperamide (IMODIUM) 2 MG capsule Take 2 capsules (4 mg total) by mouth as needed for diarrhea or loose stools. 30 capsule 0  . LORazepam (ATIVAN) 0.5 MG tablet Take 0.5-1 mg by mouth daily as needed.     . meclizine (ANTIVERT) 25 MG tablet Take 1 tablet (25 mg total) by mouth 3 (three) times daily as needed for dizziness. 90 tablet 6  . nitrofurantoin, macrocrystal-monohydrate, (MACROBID) 100 MG capsule Take 1 capsule (100 mg total) by mouth 2 (two) times daily. 10 capsule 0  . NOVOLOG FLEXPEN 100 UNIT/ML FlexPen INJECT 20 UNITS INTO THE SKIN 3 TIMES DAILY WITH MEALS 15 mL 2  . NURTEC 75 MG  TBDP Take 1 tablet by mouth daily as needed.    . nystatin (MYCOSTATIN/NYSTOP) powder APPLY 1 APPLICATION TO AFFECTED AREA TWICE A DAY 60 g 0  . OnabotulinumtoxinA (BOTOX IJ) Inject as directed every 3 (three) months. For migraine    . oxyCODONE-acetaminophen (PERCOCET) 10-325 MG tablet Take 1 tablet by mouth every 6 (six) hours as needed for pain.    . SUMAtriptan (IMITREX) 100 MG tablet Take 100 mg by mouth every 2 (two) hours as needed for migraine. May repeat in 2 hours if headache persists or recurs.    Marland Kitchen SYRINGE-NEEDLE, DISP, 3 ML (BD INTEGRA SYRINGE)  25G X 1" 3 ML MISC FOR USE WITH B12 INJECTIONS WEEKLY/MONTHLY 25 each 0  . traMADol (ULTRAM) 50 MG tablet TAKE ONE TABLET BY MOUTH EVERY 6 HOURS AS NEEDED FOR RELIEF FROM PAIN 90 tablet 1  . Vitamin D, Ergocalciferol, (DRISDOL) 1.25 MG (50000 UNIT) CAPS capsule TAKE 1 CAPSULE BY MOUTH ONE TIME PER WEEK 12 capsule 1   No facility-administered medications prior to visit.    Review of Systems;  Patient denies headache, fevers, malaise, unintentional weight loss, skin rash, eye pain, sinus congestion and sinus pain, sore throat, dysphagia,  hemoptysis , cough, dyspnea, wheezing, chest pain, palpitations, orthopnea, edema, abdominal pain, nausea, melena, diarrhea, constipation, flank pain, dysuria, hematuria, urinary  Frequency, nocturia, numbness, tingling, seizures,  Focal weakness, Loss of consciousness,  Tremor, insomnia, depression, anxiety, and suicidal ideation.      Objective:  BP (!) 164/92 (BP Location: Left Arm, Patient Position: Sitting, Cuff Size: Normal)   Pulse (!) 119   Temp (!) 97.1 F (36.2 C) (Temporal)   Resp 17   Ht _0  (1.626 m)   Wt 249 lb (112.9 kg)   LMP 05/09/2016 (LMP Unknown) Comment: D/C  SpO2 96%   BMI 42.74 kg/m   BP Readings from Last 3 Encounters:  05/18/20 (!) 164/92  05/17/20 110/66  04/13/20 (!) 137/102    Wt Readings from Last 3 Encounters:  05/18/20 249 lb (112.9 kg)  05/17/20 225 lb (102.1  kg)  04/13/20 236 lb 1.6 oz (107.1 kg)    General appearance: alert, cooperative and appears stated age Ears: normal TM's and external ear canals both ears Throat: lips, mucosa, and tongue normal; teeth and gums normal Neck: no adenopathy, no carotid bruit, supple, symmetrical, trachea midline and thyroid not enlarged, symmetric, no tenderness/mass/nodules Back: symmetric, no curvature. ROM normal. No CVA tenderness. Lungs: clear to auscultation bilaterally Heart: regular rate and rhythm, S1, S2 normal, no murmur, click, rub or gallop Abdomen: very distended,  Possible fluid wave.  soft, non-tender; bowel sounds normal; no masses,  no organomegaly Pulses: 2+ and symmetric Skin: Skin color, texture, turgor normal. No rashes or lesions Lymph nodes: Cervical, supraclavicular, and axillary nodes normal.  Lab Results  Component Value Date   HGBA1C 8.1 (H) 01/23/2020   HGBA1C 14.7 (A) 09/19/2019   HGBA1C 8.7 (A) 08/19/2018    Lab Results  Component Value Date   CREATININE 1.24 (H) 05/17/2020   CREATININE 1.21 (H) 03/26/2020   CREATININE 0.98 01/23/2020    Lab Results  Component Value Date   WBC 13.0 (H) 05/17/2020   HGB 11.1 (L) 05/17/2020   HCT 35.9 (L) 05/17/2020   PLT 452 (H) 05/17/2020   GLUCOSE 109 (H) 05/17/2020   CHOL 194 09/19/2019   TRIG 206 (H) 10/20/2019   HDL 48 (L) 09/19/2019   LDLDIRECT 92.0 02/21/2017   LDLCALC 94 09/19/2019   ALT 44 05/17/2020   AST 39 05/17/2020   NA 138 05/17/2020   K 4.6 05/17/2020   CL 104 05/17/2020   CREATININE 1.24 (H) 05/17/2020   BUN 28 (H) 05/17/2020   CO2 24 05/17/2020   TSH 2.06 03/11/2020   INR 0.9 10/25/2019   HGBA1C 8.1 (H) 01/23/2020   MICROALBUR 1.8 09/19/2019    No results found.  Assessment & Plan:   Problem List Items Addressed This Visit      Unprioritized   Abdominal distension    Accompanied by nausea, anorexia,  Fluid retention and reports of increased gas.  No severe pain or  cramping.  abd [place] to rule  out ascites.  Zaroxolyn/furosemide prescribed.  Will need BMET one week .        Relevant Orders   Basic metabolic panel   Diarrhea    Malabsorption suspected,  However she has gained weight.  Fecal fat and GI pathogen panel ordered.  GI referral in process for diagnostic colonoscopy. May need rifaximin       Lactic acidosis    Likely due to dehydration ,  Since it was noted to improve with fluid resuscitation during ED evaluation.  No ABG was done,  But bicarb was normal on BMET.  Blood cultures were drawn and are negative at 2 days and serum glucose was 109        Other Visit Diagnoses    Abdominal swelling, generalized    -  Primary   Relevant Orders   US Abdomen Complete   Secretory diarrhea       Relevant Orders   GI pathogen panel by PCR, stool   Ambulatory referral to Gastroenterology   Fecal fat, qualitative      I am having Caren Griffins R. Minks "Jenny Reichmann" start on metolazone and furosemide. I am also having her maintain her Lancet Device, OnabotulinumtoxinA (BOTOX IJ), LORazepam, meclizine, oxyCODONE-acetaminophen, blood glucose meter kit and supplies, EPINEPHrine, escitalopram, acetaminophen, FreeStyle Libre 14 Day Reader, cyclobenzaprine, buPROPion, INS SYRINGE/NEEDLE 1CC/28G, SYRINGE-NEEDLE (DISP) 3 ML, Insulin Pen Needle, atorvastatin, lamoTRIgine, FreeStyle Libre 14 Day Sensor, albuterol, Nurtec, SUMAtriptan, aspirin-acetaminophen-caffeine, loperamide, fluticasone, Basaglar KwikPen, azelastine, Horizant, traMADol, levothyroxine, nystatin, diltiazem, fluocinonide cream, nitrofurantoin (macrocrystal-monohydrate), Vitamin D (Ergocalciferol), cyanocobalamin, and NovoLOG FlexPen.  Meds ordered this encounter  Medications  . metolazone (ZAROXOLYN) 2.5 MG tablet    Sig: Take daily, 30 minutes before furosemide    Dispense:  30 tablet    Refill:  3  . furosemide (LASIX) 20 MG tablet    Sig: Take 1 tablet (20 mg total) by mouth daily.    Dispense:  30 tablet    Refill:  3  A  total of 40 minutes was spent with patient more than half of which was spent in counseling patient on the above mentioned issues , reviewing and explaining recent labs and imaging studies done, and coordination of care.  There are no discontinued medications.  Follow-up: No follow-ups on file.   [name], MD

## 2020-05-19 DIAGNOSIS — E872 Acidosis, unspecified: Secondary | ICD-10-CM | POA: Insufficient documentation

## 2020-05-19 NOTE — Assessment & Plan Note (Addendum)
Likely due to dehydration ,  Since it was noted to improve with fluid resuscitation during ED evaluation.  No ABG was done,  But bicarb was normal on BMET.  Blood cultures were drawn and are negative at 2 days and serum glucose was 109

## 2020-05-22 LAB — CULTURE, BLOOD (ROUTINE X 2)
Culture: NO GROWTH
Culture: NO GROWTH
Special Requests: ADEQUATE
Special Requests: ADEQUATE

## 2020-05-24 ENCOUNTER — Other Ambulatory Visit: Payer: BC Managed Care – PPO

## 2020-05-25 ENCOUNTER — Other Ambulatory Visit: Payer: Self-pay

## 2020-05-25 ENCOUNTER — Inpatient Hospital Stay: Admit: 2020-05-25 | Payer: BC Managed Care – PPO

## 2020-05-25 ENCOUNTER — Ambulatory Visit
Admission: RE | Admit: 2020-05-25 | Discharge: 2020-05-25 | Disposition: A | Discharge status: Home / Self Care | Payer: BC Managed Care – PPO | Source: Ambulatory Visit | Attending: Internal Medicine | Admitting: Internal Medicine

## 2020-05-25 DIAGNOSIS — R1907 Generalized intra-abdominal and pelvic swelling, mass and lump: Secondary | ICD-10-CM | POA: Diagnosis not present

## 2020-05-27 ENCOUNTER — Other Ambulatory Visit: Payer: Self-pay | Admitting: Internal Medicine

## 2020-05-27 ENCOUNTER — Ambulatory Visit: Admit: 2020-05-27 | Payer: BC Managed Care – PPO | Admitting: Orthopedic Surgery

## 2020-05-27 SURGERY — SHOULDER ARTHROSCOPY WITH SUBACROMIAL DECOMPRESSION AND OPEN ROTATOR CUFF REPAIR, OPEN BICEPS TENDON REPAIR
Anesthesia: Choice | Site: Shoulder | Laterality: Right

## 2020-05-31 ENCOUNTER — Other Ambulatory Visit: Payer: Self-pay | Admitting: Internal Medicine

## 2020-05-31 DIAGNOSIS — R140 Abdominal distension (gaseous): Secondary | ICD-10-CM

## 2020-05-31 DIAGNOSIS — R198 Other specified symptoms and signs involving the digestive system and abdomen: Secondary | ICD-10-CM

## 2020-06-01 ENCOUNTER — Other Ambulatory Visit: Payer: Self-pay

## 2020-06-01 ENCOUNTER — Telehealth: Payer: Self-pay | Admitting: Internal Medicine

## 2020-06-01 ENCOUNTER — Ambulatory Visit (INDEPENDENT_AMBULATORY_CARE_PROVIDER_SITE_OTHER): Payer: BC Managed Care – PPO | Admitting: Internal Medicine

## 2020-06-01 ENCOUNTER — Encounter: Payer: Self-pay | Admitting: Internal Medicine

## 2020-06-01 ENCOUNTER — Encounter: Payer: Self-pay | Admitting: *Deleted

## 2020-06-01 VITALS — BP 122/70 | HR 116 | Temp 97.1°F | Resp 17 | Ht 64.0 in | Wt 232.8 lb

## 2020-06-01 DIAGNOSIS — N3000 Acute cystitis without hematuria: Secondary | ICD-10-CM

## 2020-06-01 DIAGNOSIS — E11649 Type 2 diabetes mellitus with hypoglycemia without coma: Secondary | ICD-10-CM

## 2020-06-01 DIAGNOSIS — R197 Diarrhea, unspecified: Secondary | ICD-10-CM

## 2020-06-01 DIAGNOSIS — M797 Fibromyalgia: Secondary | ICD-10-CM

## 2020-06-01 DIAGNOSIS — R609 Edema, unspecified: Secondary | ICD-10-CM | POA: Diagnosis not present

## 2020-06-01 DIAGNOSIS — R52 Pain, unspecified: Secondary | ICD-10-CM | POA: Diagnosis not present

## 2020-06-01 DIAGNOSIS — R140 Abdominal distension (gaseous): Secondary | ICD-10-CM

## 2020-06-01 DIAGNOSIS — N179 Acute kidney failure, unspecified: Secondary | ICD-10-CM

## 2020-06-01 DIAGNOSIS — R300 Dysuria: Secondary | ICD-10-CM | POA: Diagnosis not present

## 2020-06-01 LAB — POCT URINALYSIS DIPSTICK
Bilirubin, UA: NEGATIVE
Blood, UA: NEGATIVE
Glucose, UA: POSITIVE — AB
Ketones, UA: NEGATIVE
Nitrite, UA: POSITIVE
Protein, UA: NEGATIVE
Spec Grav, UA: 1.01 (ref 1.010–1.025)
Urobilinogen, UA: 0.2 E.U./dL
pH, UA: 5 (ref 5.0–8.0)

## 2020-06-01 MED ORDER — AMITRIPTYLINE HCL 25 MG PO TABS: 25.0000 mg | ORAL_TABLET | Freq: Every day | ORAL | 0 refills | Status: DC

## 2020-06-01 MED ORDER — AMITRIPTYLINE HCL 10 MG PO TABS: 10.0000 mg | ORAL_TABLET | Freq: Every day | ORAL | 0 refills | Status: DC

## 2020-06-01 NOTE — Telephone Encounter (Signed)
Left pt vm to call ofc to schedule CT

## 2020-06-01 NOTE — Patient Instructions (Addendum)
Check a pre/2hr post prandial blood sugar with each meal and send me the readings in one week  Reduce basaglar to 30 units  And 15 units novolog with lunch ,  10 with dinner    adding 10 mg amitriptyline 1 hour before bedtime  For the fibromyalgia.  You can increase the dose to 20 mg after 4-5 days if needed   Ok to continue flexeril and gabapentin   Please follow up with GI regarding the diarrhea.  They have been trying to reach you

## 2020-06-01 NOTE — Progress Notes (Signed)
Subjective:  Patient ID: Masayo Fera, female    DOB: [birth year]  Age: 49 y.o. MRN: [id number]  CC: The primary encounter diagnosis was Dysuria. Diagnoses of Edema, unspecified type, Pain, Uncontrolled type 2 diabetes mellitus with hypoglycemia without coma ([place]), Acute renal failure, unspecified acute renal failure type ([place]), Abdominal distension, Acute cystitis without hematuria, Fibromyalgia, and Diarrhea, unspecified type were also pertinent to this visit.  HPI Thecla Forgione presents for follow up o recent ER visit for diarrhea and recent workup for abdominal distension.    This visit occurred during the SARS-CoV-2 public health emergency.  Safety protocols were in place, including screening questions prior to the visit, additional usage of staff PPE, and extensive cleaning of exam room while observing appropriate contact time as indicated for disinfecting solutions.   Increase abdominal girth : she was given furosemide/metolazone and sent for liver ultrasound  To rule out ascites.  Ascites was not seen but she states that she has lost 17 lbs using the combination diuretic and her abdominal girth has decreased  Weekly migraines  Managed with trigger pooint injections with neurologist  Friedman,  botox injections planned.  Fibromyalgia:  Diagnosed  12 years ago by rheumatology. Diffuse body pain for the last 7-8 days,  Out of tramadol.   Currently taking flexeril tid,  lyrica and Horizant 600 mg 2 times daily (prescribed by prior neurologist).  Last refill of tramadol was in February, and of percocet in March per DMP review. No prior trial of elavil   Dysuria  Started 2-3 days ago   Type 2 DM:  Having recurrent lows in the morning  using 40 units Basaglar and mealtime insulin with lunch and dinner (skips breakfast ).     Outpatient Medications Prior to Visit  Medication Sig Dispense Refill  . acetaminophen (TYLENOL) 325 MG tablet Take 650 mg by mouth every 6  (six) hours as needed for mild pain.     Marland Kitchen albuterol (VENTOLIN HFA) 108 (90 Base) MCG/ACT inhaler Inhale 2 puffs into the lungs every 6 (six) hours as needed for wheezing or shortness of breath. 18 g 0  . aspirin-acetaminophen-caffeine (EXCEDRIN MIGRAINE) 250-250-65 MG tablet Take 2 tablets by mouth every 6 (six) hours as needed for headache.    Marland Kitchen atorvastatin (LIPITOR) 20 MG tablet TAKE 1 TABLET BY MOUTH EVERY DAY (Patient taking differently: Take 20 mg by mouth daily at 6 PM. ) 90 tablet 0  . azelastine (ASTELIN) 0.1 % nasal spray INSTILL 1 2 SPRAYS IN EACH NOSTRIL TWICE A DAY    . blood glucose meter kit and supplies KIT Dispense based on patient and insurance preference. Use up to four times daily as directed. (FOR ICD-E11.21) 1 each 0  . buPROPion (WELLBUTRIN XL) 150 MG 24 hr tablet Take 450 mg by mouth daily.   3  . Continuous Blood Gluc Receiver (FREESTYLE LIBRE 14 DAY READER) DEVI 1 applicator by Does not apply route 4 (four) times daily. 1 Device 1  . Continuous Blood Gluc Sensor (FREESTYLE LIBRE 14 DAY SENSOR) MISC Inject 1 application into the skin 4 (four) times daily as needed (uncontrolled diabetes). 2 each 11  . cyanocobalamin (,VITAMIN B-12,) 1000 MCG/ML injection INJECT 1 ML (1,000 MCG TOTAL) INTO THE MUSCLE ONCE A WEEK. FOR 4 WEEKS, THEN MONTHLY THEREAFTER 6 mL 4  . cyclobenzaprine (FLEXERIL) 10 MG tablet Take 5-10 mg by mouth 3 (three) times daily as needed for muscle spasms.   12  . diltiazem (CARDIZEM CD) 300  MG 24 hr capsule TAKE 1 CAPSULE BY MOUTH EVERY DAY 90 capsule 1  . EPINEPHrine 0.3 mg/0.3 mL IJ SOAJ injection Inject 0.3 mg into the muscle as needed for anaphylaxis.     Marland Kitchen escitalopram (LEXAPRO) 20 MG tablet Take 20 mg by mouth daily.     . fluocinonide cream (LIDEX) 0.05 % Apply sparingly up to 2 times a day-only on the  rash 120 g 0  . fluticasone (FLONASE) 50 MCG/ACT nasal spray SPRAY 2 SPRAYS INTO EACH NOSTRIL EVERY DAY 48 mL 1  . furosemide (LASIX) 20 MG tablet Take 1  tablet (20 mg total) by mouth daily. 30 tablet 3  . HORIZANT 600 MG TBCR Take 1 tablet by mouth 2 (two) times daily.    . INS SYRINGE/NEEDLE 1CC/28G (B-D INS SYR MICROFINE 1CC/28G) 28G X 1/2" 1 ML MISC USE 1 SYRINGE DAILY AFTER SUPPER 100 each 3  . Insulin Glargine (BASAGLAR KWIKPEN) 100 UNIT/ML SOPN Inject 0.5 mLs (50 Units total) into the skin daily. 45 mL 3  . Insulin Pen Needle (BD PEN NEEDLE NANO U/F) 32G X 4 MM MISC Please specify directions, refills and quantity 100 each 11  . lamoTRIgine (LAMICTAL) 150 MG tablet Take 150 mg by mouth daily.    [name] Device MISC Test blood sugar two times daily 100 each 6  . levothyroxine (SYNTHROID) 50 MCG tablet Take 1 tablet (50 mcg total) by mouth daily. 90 tablet 3  . loperamide (IMODIUM) 2 MG capsule Take 2 capsules (4 mg total) by mouth as needed for diarrhea or loose stools. 30 capsule 0  . LORazepam (ATIVAN) 0.5 MG tablet Take 0.5-1 mg by mouth daily as needed.     . meclizine (ANTIVERT) 25 MG tablet Take 1 tablet (25 mg total) by mouth 3 (three) times daily as needed for dizziness. 90 tablet 6  . metolazone (ZAROXOLYN) 2.5 MG tablet Take daily, 30 minutes before furosemide 30 tablet 3  . nitrofurantoin, macrocrystal-monohydrate, (MACROBID) 100 MG capsule Take 1 capsule (100 mg total) by mouth 2 (two) times daily. 10 capsule 0  . NOVOLOG FLEXPEN 100 UNIT/ML FlexPen INJECT 20 UNITS INTO THE SKIN 3 TIMES DAILY WITH MEALS 15 mL 2  . NURTEC 75 MG TBDP Take 1 tablet by mouth daily as needed.    . nystatin (MYCOSTATIN/NYSTOP) powder APPLY 1 APPLICATION TO AFFECTED AREA TWICE A DAY 60 g 0  . OnabotulinumtoxinA (BOTOX IJ) Inject as directed every 3 (three) months. For migraine    . SUMAtriptan (IMITREX) 100 MG tablet Take 100 mg by mouth every 2 (two) hours as needed for migraine. May repeat in 2 hours if headache persists or recurs.    Marland Kitchen SYRINGE-NEEDLE, DISP, 3 ML (BD INTEGRA SYRINGE) 25G X 1" 3 ML MISC FOR USE WITH B12 INJECTIONS WEEKLY/MONTHLY 25 each  0  . Vitamin D, Ergocalciferol, (DRISDOL) 1.25 MG (50000 UNIT) CAPS capsule TAKE 1 CAPSULE BY MOUTH ONE TIME PER WEEK 12 capsule 1  . oxyCODONE-acetaminophen (PERCOCET) 10-325 MG tablet Take 1 tablet by mouth every 6 (six) hours as needed for pain.    . traMADol (ULTRAM) 50 MG tablet TAKE ONE TABLET BY MOUTH EVERY 6 HOURS AS NEEDED FOR RELIEF FROM PAIN 90 tablet 1   No facility-administered medications prior to visit.    Review of Systems;  Patient denies headache, fevers, malaise, unintentional weight loss, skin rash, eye pain, sinus congestion and sinus pain, sore throat, dysphagia,  hemoptysis , cough, dyspnea, wheezing, chest pain, palpitations, orthopnea, edema,  abdominal pain, nausea, melena, diarrhea, constipation, flank pain, dysuria, hematuria, urinary  Frequency, nocturia, numbness, tingling, seizures,  Focal weakness, Loss of consciousness,  Tremor, insomnia, depression, anxiety, and suicidal ideation.      Objective:  BP 122/70 (BP Location: Left Arm, Patient Position: Sitting, Cuff Size: Normal)   Pulse (!) 116   Temp (!) 97.1 F (36.2 C) (Temporal)   Resp 17   Ht 5' 4"  (1.626 m)   Wt 232 lb 12.8 oz (105.6 kg)   LMP 05/09/2016 (LMP Unknown) Comment: D/C  SpO2 93%   BMI 39.96 kg/m   BP Readings from Last 3 Encounters:  06/01/20 122/70  05/18/20 (!) 164/92  05/17/20 110/66    Wt Readings from Last 3 Encounters:  06/01/20 232 lb 12.8 oz (105.6 kg)  05/18/20 249 lb (112.9 kg)  05/17/20 225 lb (102.1 kg)    General appearance: alert, cooperative and appears stated age Ears: normal TM's and external ear canals both ears Throat: lips, mucosa, and tongue normal; teeth and gums normal Neck: no adenopathy, no carotid bruit, supple, symmetrical, trachea midline and thyroid not enlarged, symmetric, no tenderness/mass/nodules Back: symmetric, no curvature. ROM normal. No CVA tenderness. Lungs: clear to auscultation bilaterally Heart: regular rate and rhythm, S1, S2  normal, no murmur, click, rub or gallop Abdomen: soft, non-tender; bowel sounds normal; no masses,  no organomegaly Pulses: 2+ and symmetric Skin: Skin color, texture, turgor normal. No rashes or lesions Lymph nodes: Cervical, supraclavicular, and axillary nodes normal.  Lab Results  Component Value Date   HGBA1C 8.4 (H) 06/01/2020   HGBA1C 8.1 (H) 01/23/2020   HGBA1C 14.7 (A) 09/19/2019    Lab Results  Component Value Date   CREATININE 1.87 (H) 06/01/2020   CREATININE 1.24 (H) 05/17/2020   CREATININE 1.21 (H) 03/26/2020    Lab Results  Component Value Date   WBC 13.0 (H) 05/17/2020   HGB 11.1 (L) 05/17/2020   HCT 35.9 (L) 05/17/2020   PLT 452 (H) 05/17/2020   GLUCOSE 479 (H) 06/01/2020   CHOL 194 09/19/2019   TRIG 206 (H) 10/20/2019   HDL 48 (L) 09/19/2019   LDLDIRECT 92.0 02/21/2017   LDLCALC 94 09/19/2019   ALT 44 05/17/2020   AST 39 05/17/2020   NA 130 (L) 06/01/2020   K 3.8 06/01/2020   CL 97 06/01/2020   CREATININE 1.87 (H) 06/01/2020   BUN 48 (H) 06/01/2020   CO2 25 06/01/2020   TSH 2.06 03/11/2020   INR 0.9 10/25/2019   HGBA1C 8.4 (H) 06/01/2020   MICROALBUR 1.8 09/19/2019    [place] ASCITES (ABDOMEN LIMITED)  Result Date: 05/25/2020 CLINICAL DATA:  Increasing abdominal pain and weight gain, initial encounter EXAM: LIMITED ABDOMEN ULTRASOUND FOR ASCITES TECHNIQUE: Limited ultrasound survey for ascites was performed in all four abdominal quadrants. COMPARISON:  None. FINDINGS: Sonographic evaluation of the abdomen in all 4 quadrants shows no evidence of ascites. IMPRESSION: No ascites is noted. Electronically Signed   By: Inez Catalina M.D.   On: 05/25/2020 10:03    Assessment & Plan:   Problem List Items Addressed This Visit      Unprioritized   UTI (urinary tract infection)    UA strongly suggestive of UTI.  Empiric cipo      Fibromyalgia    Diagnosed remotely by rheumatology.  Medications reviewed; she is already taking flexeril and gabapentin 600 mg  bid.  Adding elavil in attempt to avoid use of narcotics/opioids      Relevant Medications   amitriptyline (  ELAVIL) 10 MG tablet   Dysuria - Primary    UA highly suggestive of UTI.  Empiric ciprofloxacin      Relevant Orders   POCT Urinalysis Dipstick (Completed)   Urine Culture   Urine Microscopic Only (Completed)   Diarrhea    She was referred to GI at last visit but has not returned their calls .       Diabetes mellitus type 2, uncontrolled ([place])    Not at goal but having recurrent hypoglycemic events. Advised to reduce basaglar dose to 30 units and continue bid mealtime insulin (15 units at lunch and 10 at dinner);  Advised to check pre and post prandials to improve glycemic control given her recent receipt of Freestyle Libre monitor   Lab Results  Component Value Date   HGBA1C 8.4 (H) 06/01/2020         Relevant Orders   Hemoglobin A1c (Completed)   Acute renal failure (ARF) (HCC)    Likely prerenal azotemia from daily use of lasix/metolazone.  UA negative for proteinuria.   Medication stopped .  Repeat BMET one week       Abdominal distension    Improved with diuresis; U/s was negative for ascites.  Given decline in GFR suggesitve of prerenal Azotemia, willl  stop diureitc and repeat BMET one week        Other Visit Diagnoses    Edema, unspecified type       Relevant Orders   Basic metabolic panel (Completed)   Pain       Relevant Orders   Sedimentation rate (Completed)     A total of 40 minutes was spent with patient more than half of which was spent in counseling patient on the above mentioned issues , reviewing and explaining recent labs and imaging studies done, and coordination of care.  I have discontinued Rosann Gorum. Cozzolino "Cindy"'s oxyCODONE-acetaminophen, traMADol, and amitriptyline. I am also having her start on amitriptyline. Additionally, I am having her maintain her Lancet Device, OnabotulinumtoxinA (BOTOX IJ), LORazepam, meclizine, blood glucose  meter kit and supplies, EPINEPHrine, escitalopram, acetaminophen, FreeStyle Libre 14 Day Reader, cyclobenzaprine, buPROPion, INS SYRINGE/NEEDLE 1CC/28G, SYRINGE-NEEDLE (DISP) 3 ML, Insulin Pen Needle, atorvastatin, lamoTRIgine, FreeStyle Libre 14 Day Sensor, albuterol, Nurtec, SUMAtriptan, aspirin-acetaminophen-caffeine, loperamide, Basaglar KwikPen, azelastine, Horizant, levothyroxine, nystatin, diltiazem, fluocinonide cream, nitrofurantoin (macrocrystal-monohydrate), Vitamin D (Ergocalciferol), cyanocobalamin, NovoLOG FlexPen, metolazone, furosemide, and fluticasone.  Meds ordered this encounter  Medications  . DISCONTD: amitriptyline (ELAVIL) 25 MG tablet    Sig: Take 1 tablet (25 mg total) by mouth at bedtime.    Dispense:  90 tablet    Refill:  0  . amitriptyline (ELAVIL) 10 MG tablet    Sig: Take 1 tablet (10 mg total) by mouth at bedtime.    Dispense:  90 tablet    Refill:  0    Medications Discontinued During This Encounter  Medication Reason  . traMADol (ULTRAM) 50 MG tablet   . amitriptyline (ELAVIL) 25 MG tablet   . oxyCODONE-acetaminophen (PERCOCET) 10-325 MG tablet     Follow-up: No follow-ups on file.   [name], MD

## 2020-06-02 ENCOUNTER — Encounter: Payer: Self-pay | Admitting: Internal Medicine

## 2020-06-02 ENCOUNTER — Other Ambulatory Visit: Payer: Self-pay | Admitting: Internal Medicine

## 2020-06-02 DIAGNOSIS — N179 Acute kidney failure, unspecified: Secondary | ICD-10-CM | POA: Insufficient documentation

## 2020-06-02 LAB — SEDIMENTATION RATE: Sed Rate: 106 mm/hr — ABNORMAL HIGH (ref 0–20)

## 2020-06-02 LAB — URINALYSIS, MICROSCOPIC ONLY

## 2020-06-02 LAB — BASIC METABOLIC PANEL
BUN: 48 mg/dL — ABNORMAL HIGH (ref 6–23)
CO2: 25 mEq/L (ref 19–32)
Calcium: 8.2 mg/dL — ABNORMAL LOW (ref 8.4–10.5)
Chloride: 97 mEq/L (ref 96–112)
Creatinine, Ser: 1.87 mg/dL — ABNORMAL HIGH (ref 0.40–1.20)
GFR: 28.51 mL/min — ABNORMAL LOW (ref >60.00)
Glucose, Bld: 479 mg/dL — ABNORMAL HIGH (ref 70–99)
Potassium: 3.8 mEq/L (ref 3.5–5.1)
Sodium: 130 mEq/L — ABNORMAL LOW (ref 135–145)

## 2020-06-02 LAB — HEMOGLOBIN A1C: Hgb A1c MFr Bld: 8.4 % — ABNORMAL HIGH (ref 4.6–6.5)

## 2020-06-02 MED ORDER — CIPROFLOXACIN HCL 250 MG PO TABS
250.0000 mg | ORAL_TABLET | Freq: Two times a day (BID) | ORAL | 0 refills | Status: DC
Start: 2020-06-02 — End: 2020-06-15

## 2020-06-02 NOTE — Assessment & Plan Note (Signed)
Diagnosed remotely by rheumatology.  Medications reviewed; she is already taking flexeril and gabapentin 600 mg bid.  Adding elavil in attempt to avoid use of narcotics/opioids

## 2020-06-02 NOTE — Assessment & Plan Note (Signed)
Likely prerenal azotemia from daily use of lasix/metolazone.  UA negative for proteinuria.   Medication stopped .  Repeat BMET one week

## 2020-06-02 NOTE — Assessment & Plan Note (Signed)
Improved with diuresis; U/s was negative for ascites.  Given decline in GFR suggesitve of prerenal Azotemia, willl  stop diureitc and repeat BMET one week

## 2020-06-02 NOTE — Assessment & Plan Note (Signed)
UA strongly suggestive of UTI.  Empiric cipo

## 2020-06-02 NOTE — Assessment & Plan Note (Addendum)
Not at goal but having recurrent hypoglycemic events. Advised to reduce basaglar dose to 30 units and continue bid mealtime insulin (15 units at lunch and 10 at dinner);  Advised to check pre and post prandials to improve glycemic control given her recent receipt of Freestyle Libre monitor   Lab Results  Component Value Date   HGBA1C 8.4 (H) 06/01/2020

## 2020-06-02 NOTE — Assessment & Plan Note (Signed)
She was referred to GI at last visit but has not returned their calls .

## 2020-06-02 NOTE — Assessment & Plan Note (Signed)
UA highly suggestive of UTI.  Empiric ciprofloxacin

## 2020-06-03 LAB — URINE CULTURE
MICRO NUMBER:: 10619842
SPECIMEN QUALITY:: ADEQUATE

## 2020-06-06 ENCOUNTER — Other Ambulatory Visit: Payer: Self-pay | Admitting: Cardiovascular Disease

## 2020-06-12 ENCOUNTER — Other Ambulatory Visit: Payer: Self-pay | Admitting: Internal Medicine

## 2020-06-15 ENCOUNTER — Encounter: Payer: Self-pay | Admitting: Internal Medicine

## 2020-06-15 ENCOUNTER — Ambulatory Visit (INDEPENDENT_AMBULATORY_CARE_PROVIDER_SITE_OTHER): Payer: BC Managed Care – PPO | Admitting: Internal Medicine

## 2020-06-15 ENCOUNTER — Other Ambulatory Visit: Payer: Self-pay

## 2020-06-15 VITALS — BP 158/82 | HR 117 | Temp 98.2°F | Resp 15 | Ht 64.0 in | Wt 239.0 lb

## 2020-06-15 DIAGNOSIS — N390 Urinary tract infection, site not specified: Secondary | ICD-10-CM | POA: Diagnosis not present

## 2020-06-15 DIAGNOSIS — L03031 Cellulitis of right toe: Secondary | ICD-10-CM

## 2020-06-15 DIAGNOSIS — N179 Acute kidney failure, unspecified: Secondary | ICD-10-CM

## 2020-06-15 DIAGNOSIS — N3000 Acute cystitis without hematuria: Secondary | ICD-10-CM

## 2020-06-15 DIAGNOSIS — K591 Functional diarrhea: Secondary | ICD-10-CM

## 2020-06-15 DIAGNOSIS — E11649 Type 2 diabetes mellitus with hypoglycemia without coma: Secondary | ICD-10-CM | POA: Diagnosis not present

## 2020-06-15 DIAGNOSIS — T17908S Unspecified foreign body in respiratory tract, part unspecified causing other injury, sequela: Secondary | ICD-10-CM

## 2020-06-15 DIAGNOSIS — R140 Abdominal distension (gaseous): Secondary | ICD-10-CM

## 2020-06-15 DIAGNOSIS — T17998S Other foreign object in respiratory tract, part unspecified causing other injury, sequela: Secondary | ICD-10-CM

## 2020-06-15 DIAGNOSIS — R000 Tachycardia, unspecified: Secondary | ICD-10-CM

## 2020-06-15 LAB — POCT URINALYSIS DIPSTICK
Bilirubin, UA: NEGATIVE
Blood, UA: NEGATIVE
Glucose, UA: POSITIVE — AB
Ketones, UA: NEGATIVE
Leukocytes, UA: NEGATIVE
Nitrite, UA: NEGATIVE
Protein, UA: NEGATIVE
Spec Grav, UA: 1.015 (ref 1.010–1.025)
Urobilinogen, UA: 0.2 E.U./dL
pH, UA: 6 (ref 5.0–8.0)

## 2020-06-15 MED ORDER — METOPROLOL TARTRATE 25 MG PO TABS: 25.0000 mg | ORAL_TABLET | Freq: Two times a day (BID) | ORAL | 3 refills | Status: DC

## 2020-06-15 MED ORDER — FUROSEMIDE 20 MG PO TABS: 20.0000 mg | ORAL_TABLET | Freq: Every day | ORAL | 3 refills | Status: DC

## 2020-06-15 MED ORDER — CEPHALEXIN 500 MG PO CAPS
500.0000 mg | ORAL_CAPSULE | Freq: Four times a day (QID) | ORAL | 0 refills | Status: DC
Start: 2020-06-15 — End: 2020-07-05

## 2020-06-15 NOTE — Patient Instructions (Signed)
GENERIC KEFLEX 4 TIMES  DAILY FOR PARONYCHIA   MODIFIED BARIUM SWALLOW ORDERED TO INVESTIGATE RECURRENT ASPIRATION EVENTS  CT ABD AND PELVIS WILL BE ORDERED FOR GI EVALUATION  STOP THE DILTIAZEM ONCE YOU START THE METOPROLOL TWICE DAILY  INCREASE DOSE FROM 25 MG TO 50 MG AFTER 48 HOURS IF BP > 150 OR PULSE > 120

## 2020-06-15 NOTE — Progress Notes (Signed)
Subjective:  Patient ID: Maria Bentley, female    DOB: 1971-08-25  Age: 49 y.o. MRN: [id number]  CC: The primary encounter diagnosis was Recurrent UTI. Diagnoses of Aspiration of liquid, sequela, Uncontrolled type 2 diabetes mellitus with hypoglycemia without coma ([place]), Tachycardia, Acute cystitis without hematuria, Abdominal distension, Abdominal distension (gaseous), Functional diarrhea, Acute renal failure, unspecified acute renal failure type ([place]), Aspiration into airway, sequela, and Paronychia of great toe, right were also pertinent to this visit.  HPI Maria Bentley presents for  Follow up on multiple  chronic issues  1) Elevated blood pressure ,  .  Taking cardizem 180 mg twice daily,  Some LE edema recurrent,  Pulse still chronically elevated.  Discussed prior metoprolol use   2) Type 2 DM.  BD labile due to multiple steroid injections. Taking 30 basaglar,  20 an d10 and 5-10 at meals. Finds that if she increases the basaglar at all she has more events    3) infected great toe:  Has been Soaking it in Epsom salts with no significant change.    4) UTI follow up. ua normal   5) Intermittent diarrhea :  Has not resolved.  GI  Referral is not until Sept  .  Wants to have CT   6) fibromyalgia responding to 20 mg elavil . Headaches also responding.  Wants to resume medical massage therapy.   7) wants a swallow study.  Aspirates liquid nearly every time she drinks any liquid     Outpatient Medications Prior to Visit  Medication Sig Dispense Refill  . acetaminophen (TYLENOL) 325 MG tablet Take 650 mg by mouth every 6 (six) hours as needed for mild pain.     Marland Kitchen albuterol (VENTOLIN HFA) 108 (90 Base) MCG/ACT inhaler Inhale 2 puffs into the lungs every 6 (six) hours as needed for wheezing or shortness of breath. 18 g 0  . amitriptyline (ELAVIL) 10 MG tablet Take 1 tablet (10 mg total) by mouth at bedtime. 90 tablet 0  . aspirin-acetaminophen-caffeine (EXCEDRIN MIGRAINE)  250-250-65 MG tablet Take 2 tablets by mouth every 6 (six) hours as needed for headache.    Marland Kitchen atorvastatin (LIPITOR) 20 MG tablet TAKE 1 TABLET BY MOUTH EVERY DAY (Patient taking differently: Take 20 mg by mouth daily at 6 PM. ) 90 tablet 0  . azelastine (ASTELIN) 0.1 % nasal spray INSTILL 1 2 SPRAYS IN EACH NOSTRIL TWICE A DAY    . blood glucose meter kit and supplies KIT Dispense based on patient and insurance preference. Use up to four times daily as directed. (FOR ICD-E11.21) 1 each 0  . buPROPion (WELLBUTRIN XL) 150 MG 24 hr tablet Take 450 mg by mouth daily.   3  . Continuous Blood Gluc Receiver (FREESTYLE LIBRE 14 DAY READER) DEVI 1 applicator by Does not apply route 4 (four) times daily. 1 Device 1  . Continuous Blood Gluc Sensor (FREESTYLE LIBRE 14 DAY SENSOR) MISC Inject 1 application into the skin 4 (four) times daily as needed (uncontrolled diabetes). 2 each 11  . cyanocobalamin (,VITAMIN B-12,) 1000 MCG/ML injection INJECT 1 ML (1,000 MCG TOTAL) INTO THE MUSCLE ONCE A WEEK. FOR 4 WEEKS, THEN MONTHLY THEREAFTER 6 mL 4  . cyclobenzaprine (FLEXERIL) 10 MG tablet Take 5-10 mg by mouth 3 (three) times daily as needed for muscle spasms.   12  . diltiazem (CARDIZEM CD) 300 MG 24 hr capsule TAKE 1 CAPSULE BY MOUTH EVERY DAY 90 capsule 1  . EPINEPHrine 0.3 mg/0.3 mL  IJ SOAJ injection Inject 0.3 mg into the muscle as needed for anaphylaxis.     Marland Kitchen escitalopram (LEXAPRO) 20 MG tablet Take 20 mg by mouth daily.     . fluocinonide cream (LIDEX) 0.05 % Apply sparingly up to 2 times a day-only on the  rash 120 g 0  . fluticasone (FLONASE) 50 MCG/ACT nasal spray SPRAY 2 SPRAYS INTO EACH NOSTRIL EVERY DAY 48 mL 1  . HORIZANT 600 MG TBCR Take 1 tablet by mouth 2 (two) times daily.    . Insulin Glargine (BASAGLAR KWIKPEN) 100 UNIT/ML SOPN Inject 0.5 mLs (50 Units total) into the skin daily. 45 mL 3  . Insulin Pen Needle (BD PEN NEEDLE NANO U/F) 32G X 4 MM MISC Please specify directions, refills and quantity  100 each 11  . lamoTRIgine (LAMICTAL) 150 MG tablet Take 150 mg by mouth daily.    [name] Device MISC Test blood sugar two times daily 100 each 6  . levothyroxine (SYNTHROID) 50 MCG tablet Take 1 tablet (50 mcg total) by mouth daily. 90 tablet 3  . loperamide (IMODIUM) 2 MG capsule Take 2 capsules (4 mg total) by mouth as needed for diarrhea or loose stools. 30 capsule 0  . LORazepam (ATIVAN) 0.5 MG tablet Take 0.5-1 mg by mouth daily as needed.     . meclizine (ANTIVERT) 25 MG tablet Take 1 tablet (25 mg total) by mouth 3 (three) times daily as needed for dizziness. 90 tablet 6  . metolazone (ZAROXOLYN) 2.5 MG tablet Take daily, 30 minutes before furosemide 30 tablet 3  . NOVOLOG FLEXPEN 100 UNIT/ML FlexPen INJECT 20 UNITS INTO THE SKIN 3 TIMES DAILY WITH MEALS 15 mL 2  . nystatin (MYCOSTATIN/NYSTOP) powder APPLY 1 APPLICATION TO AFFECTED AREA TWICE A DAY 60 g 0  . OnabotulinumtoxinA (BOTOX IJ) Inject as directed every 3 (three) months. For migraine    . SUMAtriptan (IMITREX) 100 MG tablet Take 100 mg by mouth every 2 (two) hours as needed for migraine. May repeat in 2 hours if headache persists or recurs.    Marland Kitchen SYRINGE-NEEDLE, DISP, 3 ML (BD INTEGRA SYRINGE) 25G X 1" 3 ML MISC FOR USE WITH B12 INJECTIONS WEEKLY/MONTHLY 25 each 0  . Vitamin D, Ergocalciferol, (DRISDOL) 1.25 MG (50000 UNIT) CAPS capsule TAKE 1 CAPSULE BY MOUTH ONE TIME PER WEEK 12 capsule 1  . furosemide (LASIX) 20 MG tablet Take 1 tablet (20 mg total) by mouth daily. 30 tablet 3  . ciprofloxacin (CIPRO) 250 MG tablet Take 1 tablet (250 mg total) by mouth 2 (two) times daily. 10 tablet 0  . INS SYRINGE/NEEDLE 1CC/28G (B-D INS SYR MICROFINE 1CC/28G) 28G X 1/2" 1 ML MISC USE 1 SYRINGE DAILY AFTER SUPPER 100 each 3  . nitrofurantoin, macrocrystal-monohydrate, (MACROBID) 100 MG capsule Take 1 capsule (100 mg total) by mouth 2 (two) times daily. (Patient not taking: Reported on 06/15/2020) 10 capsule 0  . NURTEC 75 MG TBDP Take 1 tablet  by mouth daily as needed.     No facility-administered medications prior to visit.    Review of Systems;  Patient denies headache, fevers, malaise, unintentional weight loss, skin rash, eye pain, sinus congestion and sinus pain, sore throat, dysphagia,  hemoptysis , cough, dyspnea, wheezing, chest pain, palpitations, orthopnea, edema, abdominal pain, nausea, melena, diarrhea, constipation, flank pain, dysuria, hematuria, urinary  Frequency, nocturia, numbness, tingling, seizures,  Focal weakness, Loss of consciousness,  Tremor, insomnia, depression, anxiety, and suicidal ideation.      Objective:  BP (!) 158/82 (BP Location: Right Arm, Patient Position: Sitting, Cuff Size: Normal)   Pulse (!) 117   Temp 98.2 F (36.8 C) (Temporal)   Resp 15   Ht 5' 4"  (1.626 m)   Wt 239 lb (108.4 kg)   LMP 05/09/2016 (LMP Unknown) Comment: D/C  SpO2 95%   BMI 41.02 kg/m   BP Readings from Last 3 Encounters:  06/15/20 (!) 158/82  06/01/20 122/70  05/18/20 (!) 164/92    Wt Readings from Last 3 Encounters:  06/15/20 239 lb (108.4 kg)  06/01/20 232 lb 12.8 oz (105.6 kg)  05/18/20 249 lb (112.9 kg)    General appearance: alert, cooperative and appears stated age Ears: normal TM's and external ear canals both ears Throat: lips, mucosa, and tongue normal; teeth and gums normal Neck: no adenopathy, no carotid bruit, supple, symmetrical, trachea midline and thyroid not enlarged, symmetric, no tenderness/mass/nodules Back: symmetric, no curvature. ROM normal. No CVA tenderness. Lungs: clear to auscultation bilaterally Heart: regular rate and rhythm, S1, S2 normal, no murmur, click, rub or gallop Abdomen: soft, non-tender; bowel sounds normal; no masses,  no organomegaly Pulses: 2+ and symmetric Skin: Skin color, texture, turgor normal. No rashes or lesions Lymph nodes: Cervical, supraclavicular, and axillary nodes normal.  Lab Results  Component Value Date   HGBA1C 8.4 (H) 06/01/2020   HGBA1C  8.1 (H) 01/23/2020   HGBA1C 14.7 (A) 09/19/2019    Lab Results  Component Value Date   CREATININE 1.87 (H) 06/01/2020   CREATININE 1.24 (H) 05/17/2020   CREATININE 1.21 (H) 03/26/2020    Lab Results  Component Value Date   WBC 13.0 (H) 05/17/2020   HGB 11.1 (L) 05/17/2020   HCT 35.9 (L) 05/17/2020   PLT 452 (H) 05/17/2020   GLUCOSE 479 (H) 06/01/2020   CHOL 194 09/19/2019   TRIG 206 (H) 10/20/2019   HDL 48 (L) 09/19/2019   LDLDIRECT 92.0 02/21/2017   LDLCALC 94 09/19/2019   ALT 44 05/17/2020   AST 39 05/17/2020   NA 130 (L) 06/01/2020   K 3.8 06/01/2020   CL 97 06/01/2020   CREATININE 1.87 (H) 06/01/2020   BUN 48 (H) 06/01/2020   CO2 25 06/01/2020   TSH 2.06 03/11/2020   INR 0.9 10/25/2019   HGBA1C 8.4 (H) 06/01/2020   MICROALBUR 1.8 09/19/2019    [place] ASCITES (ABDOMEN LIMITED)  Result Date: 05/25/2020 CLINICAL DATA:  Increasing abdominal pain and weight gain, initial encounter EXAM: LIMITED ABDOMEN ULTRASOUND FOR ASCITES TECHNIQUE: Limited ultrasound survey for ascites was performed in all four abdominal quadrants. COMPARISON:  None. FINDINGS: Sonographic evaluation of the abdomen in all 4 quadrants shows no evidence of ascites. IMPRESSION: No ascites is noted. Electronically Signed   By: Inez Catalina M.D.   On: 05/25/2020 10:03    Assessment & Plan:   Problem List Items Addressed This Visit      Unprioritized   Tachycardia    Chronic, despite maximal dose of cardizem,  And she is having edema.  Will change to metoprolol       Diabetes mellitus type 2, uncontrolled (H[facility])    Elevated secondary to recent steroid injections.  Continue current basaglar dose and adjust mealtime insulin       Relevant Orders   Comprehensive metabolic panel   UTI (urinary tract infection)    Resolved by repeat UA      Relevant Medications   cephALEXin (KEFLEX) 500 MG capsule   Diarrhea   Relevant Orders   CT  ABDOMEN PELVIS W WO CONTRAST   Abdominal distension    She is now  willing to have CT scan given recurrent diarrhea and abdominal distension       Relevant Orders   CT ABDOMEN PELVIS W WO CONTRAST   Acute renal failure (ARF) ([place])    Secondary to diuretic use. Repeat assessment needed.       Aspiration into airway    She has recurrent episodes of aspiration of thin liquids .  Modified barium swallow ordered       Paronychia of great toe, right    Cephalexin prescribed.       Relevant Medications   cephALEXin (KEFLEX) 500 MG capsule    Other Visit Diagnoses    Recurrent UTI    -  Primary   Relevant Medications   cephALEXin (KEFLEX) 500 MG capsule   Other Relevant Orders   POCT Urinalysis Dipstick (Completed)   CT ABDOMEN PELVIS W WO CONTRAST   Aspiration of liquid, sequela       Relevant Orders   SLP modified barium swallow   Abdominal distension (gaseous)          I have discontinued Derek Jack. Bommarito "Cindy"'s INS SYRINGE/NEEDLE 1CC/28G, Nurtec, nitrofurantoin (macrocrystal-monohydrate), and ciprofloxacin. I have also changed her furosemide. Additionally, I am having her start on cephALEXin and metoprolol tartrate. Lastly, I am having her maintain her Lancet Device, OnabotulinumtoxinA (BOTOX IJ), LORazepam, meclizine, blood glucose meter kit and supplies, EPINEPHrine, escitalopram, acetaminophen, FreeStyle Libre 14 Day Reader, cyclobenzaprine, buPROPion, SYRINGE-NEEDLE (DISP) 3 ML, Insulin Pen Needle, atorvastatin, lamoTRIgine, FreeStyle Libre 14 Day Sensor, albuterol, SUMAtriptan, aspirin-acetaminophen-caffeine, loperamide, Basaglar KwikPen, azelastine, Horizant, levothyroxine, diltiazem, fluocinonide cream, Vitamin D (Ergocalciferol), cyanocobalamin, NovoLOG FlexPen, metolazone, fluticasone, amitriptyline, and nystatin.  Meds ordered this encounter  Medications  . cephALEXin (KEFLEX) 500 MG capsule    Sig: Take 1 capsule (500 mg total) by mouth 4 (four) times daily.    Dispense:  28 capsule    Refill:  0  . metoprolol tartrate  (LOPRESSOR) 25 MG tablet    Sig: Take 1 tablet (25 mg total) by mouth 2 (two) times daily.    Dispense:  180 tablet    Refill:  3  . furosemide (LASIX) 20 MG tablet    Sig: Take 1 tablet (20 mg total) by mouth daily. As needed for fluid retention    Dispense:  30 tablet    Refill:  3    OK TO FILL EARLY. PATIENT DROPPED OPEN BOTTLE ON PUBLIC FLOOR    Medications Discontinued During This Encounter  Medication Reason  . INS SYRINGE/NEEDLE 1CC/28G (B-D INS SYR MICROFINE 1CC/28G) 28G X 1/2" 1 ML MISC Error  . NURTEC 75 MG TBDP Error  . ciprofloxacin (CIPRO) 250 MG tablet Completed Course  . nitrofurantoin, macrocrystal-monohydrate, (MACROBID) 100 MG capsule Completed Course  . furosemide (LASIX) 20 MG tablet Reorder    I provided 40 minutes of  face-to-face time during this encounter reviewing patient's current problems and past surgeries, labs and imaging studies, providing counseling on the above mentioned problems , and coordination  of care .  Follow-up: Return in about 3 months (around 09/15/2020) for follow up diabetes.   [name], MD

## 2020-06-16 DIAGNOSIS — T17908A Unspecified foreign body in respiratory tract, part unspecified causing other injury, initial encounter: Secondary | ICD-10-CM | POA: Insufficient documentation

## 2020-06-16 DIAGNOSIS — L03031 Cellulitis of right toe: Secondary | ICD-10-CM | POA: Insufficient documentation

## 2020-06-16 NOTE — Assessment & Plan Note (Signed)
Resolved by repeat UA

## 2020-06-16 NOTE — Assessment & Plan Note (Signed)
Chronic, despite maximal dose of cardizem,  And she is having edema.  Will change to metoprolol

## 2020-06-16 NOTE — Assessment & Plan Note (Signed)
She has recurrent episodes of aspiration of thin liquids .  Modified barium swallow ordered

## 2020-06-16 NOTE — Assessment & Plan Note (Signed)
Elevated secondary to recent steroid injections.  Continue current basaglar dose and adjust mealtime insulin

## 2020-06-16 NOTE — Assessment & Plan Note (Signed)
Cephalexin prescribed.

## 2020-06-16 NOTE — Assessment & Plan Note (Signed)
She is now willing to have CT scan given recurrent diarrhea and abdominal distension

## 2020-06-16 NOTE — Assessment & Plan Note (Signed)
Secondary to diuretic use. Repeat assessment needed.

## 2020-06-23 ENCOUNTER — Telehealth: Payer: Self-pay

## 2020-06-23 ENCOUNTER — Telehealth: Payer: Self-pay | Admitting: Internal Medicine

## 2020-06-23 ENCOUNTER — Other Ambulatory Visit: Payer: Self-pay

## 2020-06-23 ENCOUNTER — Other Ambulatory Visit (INDEPENDENT_AMBULATORY_CARE_PROVIDER_SITE_OTHER): Payer: BC Managed Care – PPO

## 2020-06-23 DIAGNOSIS — E11649 Type 2 diabetes mellitus with hypoglycemia without coma: Secondary | ICD-10-CM | POA: Diagnosis not present

## 2020-06-23 NOTE — Addendum Note (Signed)
Addended by: Leeanne Rio on: 06/23/2020 04:43 PM   Modules accepted: Orders

## 2020-06-23 NOTE — Telephone Encounter (Signed)
error 

## 2020-06-24 ENCOUNTER — Ambulatory Visit
Admission: RE | Admit: 2020-06-24 | Discharge: 2020-06-24 | Disposition: A | Discharge status: Home / Self Care | Payer: BC Managed Care – PPO | Source: Ambulatory Visit | Attending: Internal Medicine | Admitting: Internal Medicine

## 2020-06-24 ENCOUNTER — Ambulatory Visit: Admission: RE | Admit: 2020-06-24 | Payer: BC Managed Care – PPO | Source: Ambulatory Visit

## 2020-06-24 DIAGNOSIS — N390 Urinary tract infection, site not specified: Secondary | ICD-10-CM

## 2020-06-24 DIAGNOSIS — R140 Abdominal distension (gaseous): Secondary | ICD-10-CM

## 2020-06-24 DIAGNOSIS — N179 Acute kidney failure, unspecified: Secondary | ICD-10-CM

## 2020-06-24 DIAGNOSIS — K591 Functional diarrhea: Secondary | ICD-10-CM

## 2020-06-24 LAB — COMPREHENSIVE METABOLIC PANEL
ALT: 23 IU/L (ref 0–32)
AST: 16 IU/L (ref 0–40)
Albumin/Globulin Ratio: 1.5 (ref 1.2–2.2)
Albumin: 3.5 g/dL — ABNORMAL LOW (ref 3.8–4.8)
Alkaline Phosphatase: 116 IU/L (ref 48–121)
BUN/Creatinine Ratio: 14 (ref 9–23)
BUN: 18 mg/dL (ref 6–24)
Bilirubin Total: <0.2 mg/dL (ref 0.0–1.2)
CO2: 18 mmol/L — ABNORMAL LOW (ref 20–29)
Calcium: 8.3 mg/dL — ABNORMAL LOW (ref 8.7–10.2)
Chloride: 103 mmol/L (ref 96–106)
Creatinine, Ser: 1.26 mg/dL — ABNORMAL HIGH (ref 0.57–1.00)
GFR calc Af Amer: 58 mL/min/{1.73_m2} — ABNORMAL LOW (ref >59)
GFR calc non Af Amer: 50 mL/min/{1.73_m2} — ABNORMAL LOW (ref >59)
Globulin, Total: 2.4 g/dL (ref 1.5–4.5)
Glucose: 451 mg/dL — ABNORMAL HIGH (ref 65–99)
Potassium: 5 mmol/L (ref 3.5–5.2)
Sodium: 135 mmol/L (ref 134–144)
Total Protein: 5.9 g/dL — ABNORMAL LOW (ref 6.0–8.5)

## 2020-06-28 ENCOUNTER — Other Ambulatory Visit: Payer: Self-pay

## 2020-06-28 ENCOUNTER — Ambulatory Visit
Admission: RE | Admit: 2020-06-28 | Discharge: 2020-06-28 | Disposition: A | Discharge status: Home / Self Care | Payer: BC Managed Care – PPO | Source: Ambulatory Visit | Attending: Internal Medicine | Admitting: Internal Medicine

## 2020-06-28 DIAGNOSIS — R140 Abdominal distension (gaseous): Secondary | ICD-10-CM | POA: Diagnosis present

## 2020-06-28 DIAGNOSIS — N390 Urinary tract infection, site not specified: Secondary | ICD-10-CM | POA: Diagnosis not present

## 2020-06-28 DIAGNOSIS — K591 Functional diarrhea: Secondary | ICD-10-CM | POA: Insufficient documentation

## 2020-06-28 MED ORDER — IOHEXOL 300 MG/ML  SOLN
85.0000 mL | Freq: Once | INTRAMUSCULAR | Status: AC | PRN
  Administered 2020-06-28: 85 mL via INTRAVENOUS

## 2020-06-29 ENCOUNTER — Telehealth: Payer: Self-pay

## 2020-06-29 NOTE — Telephone Encounter (Signed)
[facility] called to let PCP know results are back. Impression is as follow.  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 No complicating feature.  Area of ill-defined low-density anteriorly in the left kidney in the midpole region. This could reflect an area of pyelonephritis although mass/neoplasm cannot be excluded. Consider further evaluation with MRI if the patient is asymptomatic and pyelonephritis is not clinically suspected.  Diffuse fatty infiltration of the liver.  Coronary artery disease, aortic atherosclerosis.  These results will be called to the ordering clinician or representative by the Radiologist Assistant, and communication documented in the PACS or [employer].

## 2020-06-30 NOTE — Telephone Encounter (Signed)
Please have patient schedule a follow up telephone or virtual visit to discuss the results of the CT scan

## 2020-07-01 ENCOUNTER — Telehealth: Payer: Self-pay

## 2020-07-01 DIAGNOSIS — N390 Urinary tract infection, site not specified: Secondary | ICD-10-CM

## 2020-07-01 DIAGNOSIS — G43019 Migraine without aura, intractable, without status migrainosus: Secondary | ICD-10-CM

## 2020-07-01 NOTE — Telephone Encounter (Signed)
Lab has been ordered per result note message and pt has been scheduled for a lab appt.

## 2020-07-02 ENCOUNTER — Other Ambulatory Visit: Payer: Self-pay

## 2020-07-02 ENCOUNTER — Other Ambulatory Visit (INDEPENDENT_AMBULATORY_CARE_PROVIDER_SITE_OTHER): Payer: BC Managed Care – PPO

## 2020-07-02 DIAGNOSIS — N390 Urinary tract infection, site not specified: Secondary | ICD-10-CM | POA: Diagnosis not present

## 2020-07-02 LAB — URINALYSIS, ROUTINE W REFLEX MICROSCOPIC
Bilirubin Urine: NEGATIVE
Ketones, ur: NEGATIVE
Leukocytes,Ua: NEGATIVE
Nitrite: NEGATIVE
Specific Gravity, Urine: 1.015 (ref 1.000–1.030)
Total Protein, Urine: NEGATIVE
Urine Glucose: NEGATIVE
Urobilinogen, UA: 0.2 (ref 0.0–1.0)
pH: 5.5 (ref 5.0–8.0)

## 2020-07-03 LAB — URINE CULTURE
MICRO NUMBER:: 10742602
SPECIMEN QUALITY:: ADEQUATE

## 2020-07-05 ENCOUNTER — Telehealth (INDEPENDENT_AMBULATORY_CARE_PROVIDER_SITE_OTHER): Payer: BC Managed Care – PPO | Admitting: Internal Medicine

## 2020-07-05 ENCOUNTER — Encounter: Payer: Self-pay | Admitting: Internal Medicine

## 2020-07-05 VITALS — Ht 64.0 in | Wt 239.0 lb

## 2020-07-05 DIAGNOSIS — E11649 Type 2 diabetes mellitus with hypoglycemia without coma: Secondary | ICD-10-CM

## 2020-07-05 DIAGNOSIS — E872 Acidosis, unspecified: Secondary | ICD-10-CM

## 2020-07-05 DIAGNOSIS — N2889 Other specified disorders of kidney and ureter: Secondary | ICD-10-CM

## 2020-07-05 DIAGNOSIS — Z794 Long term (current) use of insulin: Secondary | ICD-10-CM

## 2020-07-06 DIAGNOSIS — N2889 Other specified disorders of kidney and ureter: Secondary | ICD-10-CM | POA: Insufficient documentation

## 2020-07-06 NOTE — Addendum Note (Signed)
Addended by: [name] on: 07/06/2020 01:10 PM   Modules accepted: Orders

## 2020-07-06 NOTE — Progress Notes (Signed)
Virtual Visit via [place]  This visit type was conducted due to national recommendations for restrictions regarding the COVID-19 pandemic (e.g. social distancing).  This format is felt to be most appropriate for this patient at this time.  All issues noted in this document were discussed and addressed.  No physical exam was performed (except for noted visual exam findings with Video Visits).   I connected with@ on 07/06/20 at  4:30 PM EDT by a video enabled telemedicine application  and verified that I am speaking with the correct person using two identifiers. Location patient: home Location provider: work or home office Persons participating in the virtual visit: patient, provider  I discussed the limitations, risks, security and privacy concerns of performing an evaluation and management service by telephone and the availability of in person appointments. I also discussed with the patient that there may be a patient responsible charge related to this service. The patient expressed understanding and agreed to proceed.  Reason for visit: follow up on CT abd and pelvis  HPI: 49 yr old female with history of morbid obesity and uncontrolled type 2 DM  s/p bariatric surgery with weight regain, fatty liver, history of COVID 19 INFECTION  Complicated by VDRF in October 2020 presents for follow up on CT abd and pelvis done to investigate persistent lower abdominal pain and diarrhea    Recent history:  Treated In ER June 6 for dehydration, hyponatremia .  Blood cultures negative, lactic acid slightly elevated. Seen for follow up and significantly  increased abdominal girth. Sent for ABd ultrasound to rule out ascites (negative)   Treated with diuretics,  Developed acute renal failure due to overdiuresis.  Diuretics stopped but renal function has not returned to baseline. Nephrology referral made.  Treated for E Coli UTI in late June with cipro..  Dysuria  resolved,   Abd CT done July 19  worrisome  for left renal mass  Vs unresolved pyelonephritis. and repeat UA/culture done last week were negative for persistent or recurrent infection.  Pancreas atrophic ,  fatty  Liver,  And LAD calc's also discussed   DM follow up:  Current insulin regimen:  33 units basaglar,  Novolog qac 15 U bfast, 20 U lunch,  0-10 dinner.  Post prandials still 250, Prior attempts at raising basaglar dose resulted in recurrent hypoglycemia to 50's.     ROS: See pertinent positives and negatives per HPI.  Past Medical History:  Diagnosis Date  . Abscess of right buttock 04/30/2019  . Anemia   . Anxiety   . COVID-19 virus infection   . Depression 2010  . Diabetes mellitus   . DKA, type 2 ([place]) 10/13/2019  . Endometriosis   . Headache, common migraine, intractable   . History of ovarian cyst    left sided, caused by her endometriosis (possibly an endometrioma), in her 58s.  [place] Hyperlipidemia   . Hypertension   . Kidney stones    left  . Morbid obesity ([place])    s/p gastric bypass  . Personal hx of gastric bypass 2010  . Rosacea   . Vertigo     Past Surgical History:  Procedure Laterality Date  . DILATION AND CURETTAGE OF UTERUS  2011  . gastric bypass    . OVARIAN CYST REMOVAL    . ROTATOR CUFF REPAIR     right    Family History  Problem Relation Age of Onset  . Diabetes Mother   . Hypertension Mother   . Hypertension Father   .  Diabetes Father   . Depression Other        strong hx of  . Breast cancer Paternal Grandmother 58  . Breast cancer Maternal Grandmother   . Cancer Neg Hx     SOCIAL HX:  reports that she quit smoking about 24 years ago. Her smoking use included cigarettes. She has a 4.00 pack-year smoking history. She has never used smokeless tobacco. She reports that she does not drink alcohol and does not use drugs.  Current Outpatient Medications:  .  acetaminophen (TYLENOL) 325 MG tablet, Take 650 mg by mouth every 6 (six) hours as needed for mild pain. , Disp: , Rfl:  .   albuterol (VENTOLIN HFA) 108 (90 Base) MCG/ACT inhaler, Inhale 2 puffs into the lungs every 6 (six) hours as needed for wheezing or shortness of breath., Disp: 18 g, Rfl: 0 .  amitriptyline (ELAVIL) 10 MG tablet, Take 1 tablet (10 mg total) by mouth at bedtime., Disp: 90 tablet, Rfl: 0 .  aspirin-acetaminophen-caffeine (EXCEDRIN MIGRAINE) 250-250-65 MG tablet, Take 2 tablets by mouth every 6 (six) hours as needed for headache., Disp: , Rfl:  .  atorvastatin (LIPITOR) 20 MG tablet, TAKE 1 TABLET BY MOUTH EVERY DAY (Patient taking differently: Take 20 mg by mouth daily at 6 PM. ), Disp: 90 tablet, Rfl: 0 .  azelastine (ASTELIN) 0.1 % nasal spray, INSTILL 1 2 SPRAYS IN EACH NOSTRIL TWICE A DAY, Disp: , Rfl:  .  blood glucose meter kit and supplies KIT, Dispense based on patient and insurance preference. Use up to four times daily as directed. (FOR ICD-E11.21), Disp: 1 each, Rfl: 0 .  buPROPion (WELLBUTRIN XL) 150 MG 24 hr tablet, Take 450 mg by mouth daily. , Disp: , Rfl: 3 .  CLINPRO 5000 1.1 % PSTE, Place onto teeth at bedtime., Disp: , Rfl:  .  Continuous Blood Gluc Receiver (FREESTYLE LIBRE 14 DAY READER) DEVI, 1 applicator by Does not apply route 4 (four) times daily., Disp: 1 Device, Rfl: 1 .  Continuous Blood Gluc Sensor (FREESTYLE LIBRE 14 DAY SENSOR) MISC, Inject 1 application into the skin 4 (four) times daily as needed (uncontrolled diabetes)., Disp: 2 each, Rfl: 11 .  cyanocobalamin (,VITAMIN B-12,) 1000 MCG/ML injection, INJECT 1 ML (1,000 MCG TOTAL) INTO THE MUSCLE ONCE A WEEK. FOR 4 WEEKS, THEN MONTHLY THEREAFTER, Disp: 6 mL, Rfl: 4 .  cyclobenzaprine (FLEXERIL) 10 MG tablet, Take 5-10 mg by mouth 3 (three) times daily as needed for muscle spasms. , Disp: , Rfl: 12 .  diltiazem (CARDIZEM CD) 300 MG 24 hr capsule, TAKE 1 CAPSULE BY MOUTH EVERY DAY, Disp: 90 capsule, Rfl: 1 .  EPINEPHrine 0.3 mg/0.3 mL IJ SOAJ injection, Inject 0.3 mg into the muscle as needed for anaphylaxis. , Disp: , Rfl:   .  escitalopram (LEXAPRO) 20 MG tablet, Take 20 mg by mouth daily. , Disp: , Rfl:  .  fluocinonide cream (LIDEX) 0.05 %, Apply sparingly up to 2 times a day-only on the  rash, Disp: 120 g, Rfl: 0 .  fluticasone (FLONASE) 50 MCG/ACT nasal spray, SPRAY 2 SPRAYS INTO EACH NOSTRIL EVERY DAY, Disp: 48 mL, Rfl: 1 .  furosemide (LASIX) 20 MG tablet, Take 1 tablet (20 mg total) by mouth daily. As needed for fluid retention, Disp: 30 tablet, Rfl: 3 .  HORIZANT 600 MG TBCR, Take 1 tablet by mouth 2 (two) times daily., Disp: , Rfl:  .  Insulin Glargine (BASAGLAR KWIKPEN) 100 UNIT/ML SOPN, Inject 0.5 mLs (  50 Units total) into the skin daily., Disp: 45 mL, Rfl: 3 .  Insulin Pen Needle (BD PEN NEEDLE NANO U/F) 32G X 4 MM MISC, Please specify directions, refills and quantity, Disp: 100 each, Rfl: 11 .  lamoTRIgine (LAMICTAL) 150 MG tablet, Take 150 mg by mouth daily., Disp: , Rfl:  .  Lancet Device MISC, Test blood sugar two times daily, Disp: 100 each, Rfl: 6 .  levothyroxine (SYNTHROID) 50 MCG tablet, Take 1 tablet (50 mcg total) by mouth daily., Disp: 90 tablet, Rfl: 3 .  loperamide (IMODIUM) 2 MG capsule, Take 2 capsules (4 mg total) by mouth as needed for diarrhea or loose stools., Disp: 30 capsule, Rfl: 0 .  LORazepam (ATIVAN) 0.5 MG tablet, Take 0.5-1 mg by mouth daily as needed. , Disp: , Rfl:  .  meclizine (ANTIVERT) 25 MG tablet, Take 1 tablet (25 mg total) by mouth 3 (three) times daily as needed for dizziness., Disp: 90 tablet, Rfl: 6 .  metolazone (ZAROXOLYN) 2.5 MG tablet, Take daily, 30 minutes before furosemide, Disp: 30 tablet, Rfl: 3 .  metoprolol tartrate (LOPRESSOR) 25 MG tablet, Take 1 tablet (25 mg total) by mouth 2 (two) times daily., Disp: 180 tablet, Rfl: 3 .  NOVOLOG FLEXPEN 100 UNIT/ML FlexPen, INJECT 20 UNITS INTO THE SKIN 3 TIMES DAILY WITH MEALS, Disp: 15 mL, Rfl: 2 .  nystatin (MYCOSTATIN/NYSTOP) powder, APPLY 1 APPLICATION TO AFFECTED AREA TWICE A DAY, Disp: 60 g, Rfl: 0 .   OnabotulinumtoxinA (BOTOX IJ), Inject as directed every 3 (three) months. For migraine, Disp: , Rfl:  .  promethazine (PHENERGAN) 25 MG tablet, Take 25 mg by mouth daily as needed., Disp: , Rfl:  .  SUMAtriptan (IMITREX) 100 MG tablet, Take 100 mg by mouth every 2 (two) hours as needed for migraine. May repeat in 2 hours if headache persists or recurs., Disp: , Rfl:  .  SYRINGE-NEEDLE, DISP, 3 ML (BD INTEGRA SYRINGE) 25G X 1" 3 ML MISC, FOR USE WITH B12 INJECTIONS WEEKLY/MONTHLY, Disp: 25 each, Rfl: 0 .  Vitamin D, Ergocalciferol, (DRISDOL) 1.25 MG (50000 UNIT) CAPS capsule, TAKE 1 CAPSULE BY MOUTH ONE TIME PER WEEK, Disp: 12 capsule, Rfl: 1  EXAM:  VITALS per patient if applicable:  GENERAL: alert, oriented, appears well and in no acute distress  HEENT: atraumatic, conjunttiva clear, no obvious abnormalities on inspection of external nose and ears  NECK: normal movements of the head and neck  LUNGS: on inspection no signs of respiratory distress, breathing rate appears normal, no obvious gross SOB, gasping or wheezing  CV: no obvious cyanosis  MS: moves all visible extremities without noticeable abnormality  PSYCH/NEURO: pleasant and cooperative, no obvious depression or anxiety, speech and thought processing grossly intact  ASSESSMENT AND PLAN:  Discussed the following assessment and plan:  Type 2 diabetes mellitus with hypoglycemia without coma, with long-term current use of insulin (HCC) - Plan: Basic metabolic panel, C-peptide  Lactic acidosis  Renal mass, left - Plan: MR Abdomen W Wo Contrast  Other specified disorders of kidney and ureter  Lactic acidosis Noted during ER visit in June. Not taking metformin.  Acid base balance still off based on last BMET.  Etiology   Likely due to uncontrolled diabetes and   Renal disease.   Referral to nephrology in progress.   Lab Results  Component Value Date   NA 135 06/23/2020   K 5.0 06/23/2020   CL 103 06/23/2020   CO2 18 (L)  06/23/2020   Lab  Results  Component Value Date   CREATININE 1.26 (H) 06/23/2020      Renal mass, left Noted on CT Abd and pelvis .  She is asymptomatic and urine culture /UA were repeated and normal.  MRI needed     I discussed the assessment and treatment plan with the patient. The patient was provided an opportunity to ask questions and all were answered. The patient agreed with the plan and demonstrated an understanding of the instructions.   The patient was advised to call back or seek an in-person evaluation if the symptoms worsen or if the condition fails to improve as anticipated.  I provided  30 minutes of non-face-to-face time during this encounter.   [name], MD

## 2020-07-06 NOTE — Assessment & Plan Note (Signed)
Noted during ER visit in June. Not taking metformin.  Acid base balance still off based on last BMET.  Etiology   Likely due to uncontrolled diabetes and   Renal disease.   Referral to nephrology in progress.   Lab Results  Component Value Date   NA 135 06/23/2020   K 5.0 06/23/2020   CL 103 06/23/2020   CO2 18 (L) 06/23/2020   Lab Results  Component Value Date   CREATININE 1.26 (H) 06/23/2020

## 2020-07-06 NOTE — Assessment & Plan Note (Signed)
Noted on CT Abd and pelvis .  She is asymptomatic and urine culture /UA were repeated and normal.  MRI needed

## 2020-07-07 ENCOUNTER — Other Ambulatory Visit: Payer: Self-pay | Admitting: Orthopedic Surgery

## 2020-07-08 ENCOUNTER — Telehealth: Payer: Self-pay | Admitting: Internal Medicine

## 2020-07-08 ENCOUNTER — Telehealth: Payer: Self-pay

## 2020-07-08 NOTE — Telephone Encounter (Signed)
error 

## 2020-07-08 NOTE — Telephone Encounter (Signed)
"  Rejection Reason - Patient Declined" [facility] said about 16 hours ago

## 2020-07-12 ENCOUNTER — Ambulatory Visit: Payer: BC Managed Care – PPO

## 2020-07-14 ENCOUNTER — Other Ambulatory Visit: Payer: Self-pay

## 2020-07-14 ENCOUNTER — Encounter
Admission: RE | Admit: 2020-07-14 | Discharge: 2020-07-14 | Disposition: A | Discharge status: Home / Self Care | Payer: BC Managed Care – PPO | Source: Ambulatory Visit | Attending: Orthopedic Surgery | Admitting: Orthopedic Surgery

## 2020-07-14 DIAGNOSIS — Z01812 Encounter for preprocedural laboratory examination: Secondary | ICD-10-CM | POA: Diagnosis not present

## 2020-07-14 HISTORY — DX: Personal history of urinary calculi: Z87.442

## 2020-07-14 NOTE — Patient Instructions (Signed)
Your procedure is scheduled on: Monday 07/19/20.  Report to DAY SURGERY DEPARTMENT LOCATED ON 2ND FLOOR MEDICAL MALL ENTRANCE. To find out your arrival time please call [phone] between 1PM - 3PM Friday 07/16/20.   Remember: Instructions that are not followed completely may result in serious medical risk, up to and including death, or upon the discretion of your surgeon and anesthesiologist your surgery may need to be rescheduled.     __X__ 1. Do not eat food after midnight the night before your procedure.                 No gum chewing or hard candies. You may drink clear liquids up to 2 hours                 before you are scheduled to arrive for your surgery- DO NOT drink clear                 liquids within 2 hours of the start of your surgery.                 Clear Liquids include:  water, apple juice without pulp, clear carbohydrate                 drink such as Clearfast or Gatorade, Black Coffee or Tea (Do not add                 milk or creamer to coffee or tea).   __X__2.  On the morning of surgery brush your teeth with toothpaste and water, you may rinse your mouth with mouthwash if you wish.  Do not swallow any toothpaste or mouthwash.    __X__ 3.  No Alcohol for 24 hours before or after surgery.  __X__ 4.  Do Not Smoke or use e-cigarettes For 24 Hours Prior to Your Surgery.                 Do not use any chewable tobacco products for at least 6 hours prior to                 surgery.  __X__5.  Notify your doctor if there is any change in your medical condition      (cold, fever, infections).      Do NOT wear jewelry, make-up, hairpins, clips or nail polish. Do NOT wear lotions, powders, or perfumes.  Do NOT shave 48 hours prior to surgery. Men may shave face and neck. Do NOT bring valuables to the hospital.     [facility] is not responsible for any belongings or valuables.   Contacts, dentures/partials or body piercings may not be worn into surgery. Bring a case  for your contacts, glasses or hearing aids, a denture cup will be supplied.    Patients discharged the day of surgery will not be allowed to drive home.    __X__ Take these medicines the morning of surgery with A SIP OF WATER:     1. albuterol (VENTOLIN HFA)   2. atorvastatin (LIPITOR)   3. buPROPion (WELLBUTRIN XL)  4. escitalopram (LEXAPRO)   5. fluticasone (FLONASE)  6. HORIZANT   7. lamoTRIgine (LAMICTAL)   8. metoprolol tartrate (LOPRESSOR)   9. Rimegepant Sulfate (NURTEC PO) if needed  10. meclizine (ANTIVERT) if needed     __X__ Use CHG Soap wipes as directed  __X__ Use inhalers on the day of surgery. Also bring the inhaler with you to the hospital on the morning of  surgery.   __X__ Take 1/2 of usual insulin dose the night before surgery. No insulin the morning of surgery.   __X__ Stop Anti-inflammatories 7 days before surgery such as Advil, Ibuprofen, Motrin, BC or Goodies Powder, Naprosyn, Naproxen, Aleve, Aspirin, Meloxicam. May take Tylenol if needed for pain or discomfort.   __X__Do not start taking any new herbal supplements or vitamins prior to your procedure.     Wear comfortable clothing (specific to your surgery type) to the hospital.  Plan for stool softeners for home use; pain medications have a tendency to cause constipation. You can also help prevent constipation by eating foods high in fiber such as fruits and vegetables and drinking plenty of fluids as your diet allows.  After surgery, you can prevent lung complications by doing breathing exercises.Take deep breaths and cough every 1-2 hours. Your doctor may order a device called an Incentive Spirometer to help you take deep breaths.  Please call the [facility] at [phone] if you have any questions about these instructions

## 2020-07-15 ENCOUNTER — Other Ambulatory Visit
Admission: RE | Admit: 2020-07-15 | Discharge: 2020-07-15 | Disposition: A | Discharge status: Home / Self Care | Payer: BC Managed Care – PPO | Source: Ambulatory Visit | Attending: Orthopedic Surgery | Admitting: Orthopedic Surgery

## 2020-07-15 DIAGNOSIS — Z01812 Encounter for preprocedural laboratory examination: Secondary | ICD-10-CM | POA: Insufficient documentation

## 2020-07-15 DIAGNOSIS — Z20822 Contact with and (suspected) exposure to covid-19: Secondary | ICD-10-CM | POA: Insufficient documentation

## 2020-07-15 LAB — BASIC METABOLIC PANEL
Anion gap: 7 (ref 5–15)
BUN: 27 mg/dL — ABNORMAL HIGH (ref 6–20)
CO2: 26 mmol/L (ref 22–32)
Calcium: 8.3 mg/dL — ABNORMAL LOW (ref 8.9–10.3)
Chloride: 106 mmol/L (ref 98–111)
Creatinine, Ser: 1.28 mg/dL — ABNORMAL HIGH (ref 0.44–1.00)
GFR calc Af Amer: 56 mL/min — ABNORMAL LOW (ref >60)
GFR calc non Af Amer: 49 mL/min — ABNORMAL LOW (ref >60)
Glucose, Bld: 151 mg/dL — ABNORMAL HIGH (ref 70–99)
Potassium: 4.3 mmol/L (ref 3.5–5.1)
Sodium: 139 mmol/L (ref 135–145)

## 2020-07-15 LAB — CBC
HCT: 36.4 % (ref 36.0–46.0)
Hemoglobin: 11.4 g/dL — ABNORMAL LOW (ref 12.0–15.0)
MCH: 29.3 pg (ref 26.0–34.0)
MCHC: 31.3 g/dL (ref 30.0–36.0)
MCV: 93.6 fL (ref 80.0–100.0)
Platelets: 445 10*3/uL — ABNORMAL HIGH (ref 150–400)
RBC: 3.89 MIL/uL (ref 3.87–5.11)
RDW: 16.5 % — ABNORMAL HIGH (ref 11.5–15.5)
WBC: 7.3 10*3/uL (ref 4.0–10.5)
nRBC: 0 % (ref 0.0–0.2)

## 2020-07-15 LAB — SARS CORONAVIRUS 2 (TAT 6-24 HRS): SARS Coronavirus 2: NEGATIVE

## 2020-07-19 ENCOUNTER — Encounter
Admission: RE | Disposition: A | Discharge status: Home / Self Care | Payer: Self-pay | Source: Home / Self Care | Attending: Orthopedic Surgery

## 2020-07-19 ENCOUNTER — Encounter: Payer: Self-pay | Admitting: Orthopedic Surgery

## 2020-07-19 ENCOUNTER — Ambulatory Visit
Admission: RE | Admit: 2020-07-19 | Discharge: 2020-07-19 | Disposition: A | Discharge status: Home / Self Care | Payer: BC Managed Care – PPO | Attending: Orthopedic Surgery | Admitting: Orthopedic Surgery

## 2020-07-19 ENCOUNTER — Ambulatory Visit: Payer: BC Managed Care – PPO

## 2020-07-19 ENCOUNTER — Ambulatory Visit: Payer: BC Managed Care – PPO | Admitting: Anesthesiology

## 2020-07-19 ENCOUNTER — Other Ambulatory Visit: Payer: Self-pay | Admitting: Internal Medicine

## 2020-07-19 DIAGNOSIS — M25811 Other specified joint disorders, right shoulder: Secondary | ICD-10-CM | POA: Diagnosis not present

## 2020-07-19 DIAGNOSIS — Z7982 Long term (current) use of aspirin: Secondary | ICD-10-CM | POA: Insufficient documentation

## 2020-07-19 DIAGNOSIS — Z9884 Bariatric surgery status: Secondary | ICD-10-CM | POA: Insufficient documentation

## 2020-07-19 DIAGNOSIS — Z888 Allergy status to other drugs, medicaments and biological substances status: Secondary | ICD-10-CM | POA: Diagnosis not present

## 2020-07-19 DIAGNOSIS — J45909 Unspecified asthma, uncomplicated: Secondary | ICD-10-CM | POA: Insufficient documentation

## 2020-07-19 DIAGNOSIS — Z419 Encounter for procedure for purposes other than remedying health state, unspecified: Secondary | ICD-10-CM

## 2020-07-19 DIAGNOSIS — Z79899 Other long term (current) drug therapy: Secondary | ICD-10-CM | POA: Diagnosis not present

## 2020-07-19 DIAGNOSIS — G43009 Migraine without aura, not intractable, without status migrainosus: Secondary | ICD-10-CM | POA: Diagnosis not present

## 2020-07-19 DIAGNOSIS — Z79890 Hormone replacement therapy (postmenopausal): Secondary | ICD-10-CM | POA: Insufficient documentation

## 2020-07-19 DIAGNOSIS — E785 Hyperlipidemia, unspecified: Secondary | ICD-10-CM | POA: Diagnosis not present

## 2020-07-19 DIAGNOSIS — F419 Anxiety disorder, unspecified: Secondary | ICD-10-CM | POA: Diagnosis not present

## 2020-07-19 DIAGNOSIS — M1990 Unspecified osteoarthritis, unspecified site: Secondary | ICD-10-CM | POA: Insufficient documentation

## 2020-07-19 DIAGNOSIS — M797 Fibromyalgia: Secondary | ICD-10-CM | POA: Insufficient documentation

## 2020-07-19 DIAGNOSIS — F329 Major depressive disorder, single episode, unspecified: Secondary | ICD-10-CM | POA: Insufficient documentation

## 2020-07-19 DIAGNOSIS — Z794 Long term (current) use of insulin: Secondary | ICD-10-CM | POA: Insufficient documentation

## 2020-07-19 DIAGNOSIS — Z91018 Allergy to other foods: Secondary | ICD-10-CM | POA: Diagnosis not present

## 2020-07-19 DIAGNOSIS — S46011A Strain of muscle(s) and tendon(s) of the rotator cuff of right shoulder, initial encounter: Secondary | ICD-10-CM | POA: Insufficient documentation

## 2020-07-19 DIAGNOSIS — Z882 Allergy status to sulfonamides status: Secondary | ICD-10-CM | POA: Insufficient documentation

## 2020-07-19 DIAGNOSIS — Z87892 Personal history of anaphylaxis: Secondary | ICD-10-CM | POA: Insufficient documentation

## 2020-07-19 DIAGNOSIS — Z8616 Personal history of COVID-19: Secondary | ICD-10-CM | POA: Diagnosis not present

## 2020-07-19 DIAGNOSIS — E119 Type 2 diabetes mellitus without complications: Secondary | ICD-10-CM | POA: Insufficient documentation

## 2020-07-19 DIAGNOSIS — Z87891 Personal history of nicotine dependence: Secondary | ICD-10-CM | POA: Diagnosis not present

## 2020-07-19 DIAGNOSIS — G4730 Sleep apnea, unspecified: Secondary | ICD-10-CM | POA: Insufficient documentation

## 2020-07-19 DIAGNOSIS — W19XXXA Unspecified fall, initial encounter: Secondary | ICD-10-CM | POA: Diagnosis not present

## 2020-07-19 DIAGNOSIS — Z881 Allergy status to other antibiotic agents status: Secondary | ICD-10-CM | POA: Diagnosis not present

## 2020-07-19 HISTORY — PX: SHOULDER ARTHROSCOPY WITH SUBACROMIAL DECOMPRESSION AND OPEN ROTATOR C: SHX5688

## 2020-07-19 LAB — GLUCOSE, CAPILLARY
Glucose-Capillary: 86 mg/dL (ref 70–99)
Glucose-Capillary: 139 mg/dL — ABNORMAL HIGH (ref 70–99)

## 2020-07-19 SURGERY — SHOULDER ARTHROSCOPY WITH SUBACROMIAL DECOMPRESSION AND OPEN ROTATOR CUFF REPAIR, OPEN BICEPS TENDON REPAIR
Anesthesia: General | Laterality: Right

## 2020-07-19 MED ORDER — SODIUM CHLORIDE 0.9 % IV SOLN: INTRAVENOUS | Status: DC

## 2020-07-19 MED ORDER — CEFAZOLIN SODIUM-DEXTROSE 2-3 GM-%(50ML) IV SOLR
INTRAVENOUS | Status: DC | PRN
  Administered 2020-07-19: 2 g via INTRAVENOUS

## 2020-07-19 MED ORDER — ORAL CARE MOUTH RINSE: 15.0000 mL | Freq: Once | OROMUCOSAL | Status: AC

## 2020-07-19 MED ORDER — BUPIVACAINE LIPOSOME 1.3 % IJ SUSP
INTRAMUSCULAR | Status: AC
  Filled 2020-07-19: qty 20

## 2020-07-19 MED ORDER — DEXAMETHASONE SODIUM PHOSPHATE 10 MG/ML IJ SOLN
INTRAMUSCULAR | Status: DC | PRN
  Administered 2020-07-19: 10 mg via INTRAVENOUS

## 2020-07-19 MED ORDER — BUPIVACAINE HCL (PF) 0.5 % IJ SOLN
INTRAMUSCULAR | Status: AC
  Filled 2020-07-19: qty 30

## 2020-07-19 MED ORDER — LIDOCAINE HCL (CARDIAC) PF 100 MG/5ML IV SOSY
PREFILLED_SYRINGE | INTRAVENOUS | Status: DC | PRN
  Administered 2020-07-19: 100 mg via INTRAVENOUS

## 2020-07-19 MED ORDER — LACTATED RINGERS IV SOLN
INTRAVENOUS | Status: DC | PRN
  Administered 2020-07-19: 12000 mL

## 2020-07-19 MED ORDER — ACETAMINOPHEN 500 MG PO TABS
1000.0000 mg | ORAL_TABLET | Freq: Three times a day (TID) | ORAL | 2 refills | Status: AC
Start: 2020-07-19 — End: 2021-07-19

## 2020-07-19 MED ORDER — CHLORHEXIDINE GLUCONATE 0.12 % MT SOLN
OROMUCOSAL | Status: AC
  Administered 2020-07-19: 15 mL via OROMUCOSAL
  Filled 2020-07-19: qty 15

## 2020-07-19 MED ORDER — KETOROLAC TROMETHAMINE 30 MG/ML IJ SOLN
INTRAMUSCULAR | Status: DC | PRN
  Administered 2020-07-19: 30 mg via INTRAVENOUS

## 2020-07-19 MED ORDER — EPHEDRINE 5 MG/ML INJ
INTRAVENOUS | Status: AC
  Filled 2020-07-19: qty 10

## 2020-07-19 MED ORDER — BUPIVACAINE HCL (PF) 0.5 % IJ SOLN
INTRAMUSCULAR | Status: AC
  Filled 2020-07-19: qty 20

## 2020-07-19 MED ORDER — PROPOFOL 10 MG/ML IV BOLUS
INTRAVENOUS | Status: DC | PRN
  Administered 2020-07-19: 200 mg via INTRAVENOUS

## 2020-07-19 MED ORDER — CEFAZOLIN SODIUM-DEXTROSE 2-4 GM/100ML-% IV SOLN
INTRAVENOUS | Status: AC
  Filled 2020-07-19: qty 100

## 2020-07-19 MED ORDER — EPHEDRINE SULFATE 50 MG/ML IJ SOLN
INTRAMUSCULAR | Status: DC | PRN
  Administered 2020-07-19 (×2): 5 mg via INTRAVENOUS
  Administered 2020-07-19: 10 mg via INTRAVENOUS

## 2020-07-19 MED ORDER — ROCURONIUM BROMIDE 10 MG/ML (PF) SYRINGE
PREFILLED_SYRINGE | INTRAVENOUS | Status: AC
  Filled 2020-07-19: qty 10

## 2020-07-19 MED ORDER — PROPOFOL 10 MG/ML IV BOLUS
INTRAVENOUS | Status: AC
  Filled 2020-07-19: qty 20

## 2020-07-19 MED ORDER — FENTANYL CITRATE (PF) 100 MCG/2ML IJ SOLN: 25.0000 ug | INTRAMUSCULAR | Status: DC | PRN

## 2020-07-19 MED ORDER — KETOROLAC TROMETHAMINE 30 MG/ML IJ SOLN
INTRAMUSCULAR | Status: AC
  Filled 2020-07-19: qty 1

## 2020-07-19 MED ORDER — OXYCODONE HCL 5 MG PO TABS: 5.0000 mg | ORAL_TABLET | ORAL | 0 refills | Status: DC | PRN

## 2020-07-19 MED ORDER — OXYCODONE HCL 5 MG/5ML PO SOLN: 5.0000 mg | Freq: Once | ORAL | Status: DC | PRN

## 2020-07-19 MED ORDER — CHLORHEXIDINE GLUCONATE 0.12 % MT SOLN: 15.0000 mL | Freq: Once | OROMUCOSAL | Status: AC

## 2020-07-19 MED ORDER — ASPIRIN EC 325 MG PO TBEC
325.0000 mg | DELAYED_RELEASE_TABLET | Freq: Every day | ORAL | 0 refills | Status: AC
Start: 2020-07-19 — End: 2020-08-02

## 2020-07-19 MED ORDER — FENTANYL CITRATE (PF) 100 MCG/2ML IJ SOLN
INTRAMUSCULAR | Status: AC
  Filled 2020-07-19: qty 2

## 2020-07-19 MED ORDER — BUPIVACAINE LIPOSOME 1.3 % IJ SUSP
INTRAMUSCULAR | Status: DC | PRN
Start: 2020-07-19 — End: 2020-07-19
  Administered 2020-07-19: 10 mL

## 2020-07-19 MED ORDER — CLINDAMYCIN PHOSPHATE 900 MG/50ML IV SOLN: 900.0000 mg | INTRAVENOUS | Status: DC

## 2020-07-19 MED ORDER — PHENYLEPHRINE HCL (PRESSORS) 10 MG/ML IV SOLN
INTRAVENOUS | Status: DC | PRN
  Administered 2020-07-19: 200 ug via INTRAVENOUS
  Administered 2020-07-19 (×3): 100 ug via INTRAVENOUS

## 2020-07-19 MED ORDER — VASOPRESSIN 20 UNIT/ML IV SOLN
INTRAVENOUS | Status: AC
  Filled 2020-07-19: qty 1

## 2020-07-19 MED ORDER — BUPIVACAINE HCL (PF) 0.5 % IJ SOLN
INTRAMUSCULAR | Status: DC | PRN
  Administered 2020-07-19: 10 mL

## 2020-07-19 MED ORDER — VASOPRESSIN 20 UNIT/ML IV SOLN
INTRAVENOUS | Status: DC | PRN
Start: 2020-07-19 — End: 2020-07-19
  Administered 2020-07-19: 1 [IU] via INTRAVENOUS
  Administered 2020-07-19 (×2): 2 [IU] via INTRAVENOUS

## 2020-07-19 MED ORDER — CLINDAMYCIN PHOSPHATE 900 MG/50ML IV SOLN
INTRAVENOUS | Status: AC
  Filled 2020-07-19: qty 50

## 2020-07-19 MED ORDER — FAMOTIDINE 20 MG PO TABS
ORAL_TABLET | ORAL | Status: AC
  Administered 2020-07-19: 20 mg via ORAL
  Filled 2020-07-19: qty 1

## 2020-07-19 MED ORDER — MIDAZOLAM HCL 2 MG/2ML IJ SOLN: 1.0000 mg | INTRAMUSCULAR | Status: DC | PRN

## 2020-07-19 MED ORDER — SODIUM CHLORIDE 0.9 % IV SOLN
INTRAVENOUS | Status: DC | PRN
  Administered 2020-07-19: 50 ug/min via INTRAVENOUS

## 2020-07-19 MED ORDER — SUGAMMADEX SODIUM 500 MG/5ML IV SOLN
INTRAVENOUS | Status: DC | PRN
  Administered 2020-07-19: 200 mg via INTRAVENOUS

## 2020-07-19 MED ORDER — OXYCODONE HCL 5 MG PO TABS: 5.0000 mg | ORAL_TABLET | Freq: Once | ORAL | Status: DC | PRN

## 2020-07-19 MED ORDER — FENTANYL CITRATE (PF) 100 MCG/2ML IJ SOLN
INTRAMUSCULAR | Status: AC
  Administered 2020-07-19: 50 ug via INTRAVENOUS
  Filled 2020-07-19: qty 2

## 2020-07-19 MED ORDER — DEXAMETHASONE SODIUM PHOSPHATE 10 MG/ML IJ SOLN
INTRAMUSCULAR | Status: AC
  Filled 2020-07-19: qty 1

## 2020-07-19 MED ORDER — ACETAMINOPHEN 10 MG/ML IV SOLN
INTRAVENOUS | Status: AC
  Filled 2020-07-19: qty 100

## 2020-07-19 MED ORDER — LIDOCAINE HCL (PF) 2 % IJ SOLN
INTRAMUSCULAR | Status: AC
  Filled 2020-07-19: qty 5

## 2020-07-19 MED ORDER — FENTANYL CITRATE (PF) 100 MCG/2ML IJ SOLN: 50.0000 ug | INTRAMUSCULAR | Status: DC | PRN

## 2020-07-19 MED ORDER — ONDANSETRON HCL 4 MG/2ML IJ SOLN
INTRAMUSCULAR | Status: AC
  Filled 2020-07-19: qty 2

## 2020-07-19 MED ORDER — EPINEPHRINE PF 1 MG/ML IJ SOLN
INTRAMUSCULAR | Status: AC
  Filled 2020-07-19: qty 4

## 2020-07-19 MED ORDER — ONDANSETRON 4 MG PO TBDP
4.0000 mg | ORAL_TABLET | Freq: Three times a day (TID) | ORAL | 0 refills | Status: DC | PRN
Start: 2020-07-19 — End: 2021-10-03

## 2020-07-19 MED ORDER — FENTANYL CITRATE (PF) 100 MCG/2ML IJ SOLN
INTRAMUSCULAR | Status: DC | PRN
  Administered 2020-07-19: 100 ug via INTRAVENOUS

## 2020-07-19 MED ORDER — ONDANSETRON HCL 4 MG/2ML IJ SOLN: 4.0000 mg | Freq: Once | INTRAMUSCULAR | Status: DC | PRN

## 2020-07-19 MED ORDER — ONDANSETRON HCL 4 MG/2ML IJ SOLN
INTRAMUSCULAR | Status: DC | PRN
  Administered 2020-07-19: 4 mg via INTRAVENOUS

## 2020-07-19 MED ORDER — MIDAZOLAM HCL 2 MG/2ML IJ SOLN
INTRAMUSCULAR | Status: AC
  Filled 2020-07-19: qty 2

## 2020-07-19 MED ORDER — SODIUM CHLORIDE (PF) 0.9 % IJ SOLN
INTRAMUSCULAR | Status: AC
  Filled 2020-07-19: qty 20

## 2020-07-19 MED ORDER — FAMOTIDINE 20 MG PO TABS: 20.0000 mg | ORAL_TABLET | Freq: Once | ORAL | Status: AC

## 2020-07-19 MED ORDER — MIDAZOLAM HCL 2 MG/2ML IJ SOLN
INTRAMUSCULAR | Status: AC
  Administered 2020-07-19: 1 mg via INTRAVENOUS
  Filled 2020-07-19: qty 2

## 2020-07-19 MED ORDER — MIDAZOLAM HCL 2 MG/2ML IJ SOLN
INTRAMUSCULAR | Status: DC | PRN
  Administered 2020-07-19: 2 mg via INTRAVENOUS

## 2020-07-19 MED ORDER — ROCURONIUM BROMIDE 100 MG/10ML IV SOLN
INTRAVENOUS | Status: DC | PRN
  Administered 2020-07-19: 80 mg via INTRAVENOUS

## 2020-07-19 MED ORDER — ACETAMINOPHEN 10 MG/ML IV SOLN: 1000.0000 mg | Freq: Once | INTRAVENOUS | Status: DC | PRN

## 2020-07-19 MED ORDER — ACETAMINOPHEN 10 MG/ML IV SOLN
INTRAVENOUS | Status: DC | PRN
  Administered 2020-07-19: 1000 mg via INTRAVENOUS

## 2020-07-19 SURGICAL SUPPLY — 84 items
ADAPTER IRRIG TUBE 2 SPIKE SOL (ADAPTER) ×4 IMPLANT
ADH SKN CLS APL DERMABOND .7 (GAUZE/BANDAGES/DRESSINGS)
ADPR TBG 2 SPK PMP STRL ASCP (ADAPTER) ×2
ANCH SUT 2X2.3 TAPE (Anchor) ×1 IMPLANT
ANCH SUT SHRT 12.5 CANN EYLT (Anchor) ×1 IMPLANT
ANCH SUT SWLK 19.1X5.5 CLS (Anchor) ×2 IMPLANT
ANCHOR 2.3 SP SGL 1.2 XBRAID (Anchor) ×2 IMPLANT
ANCHOR SUT BIOCOMP LK 2.9X12.5 (Anchor) ×2 IMPLANT
ANCHOR SWIVELOCK BIO COMP (Anchor) ×4 IMPLANT
APL PRP STRL LF DISP 70% ISPRP (MISCELLANEOUS) ×1
BNDG ADH 2 X3.75 FABRIC TAN LF (GAUZE/BANDAGES/DRESSINGS) ×2 IMPLANT
BNDG ADH XL 3.75X2 STRCH LF (GAUZE/BANDAGES/DRESSINGS) ×1
BUR BR 5.5 12 FLUTE (BURR) ×2 IMPLANT
BUR RADIUS 4.0X18.5 (BURR) ×2 IMPLANT
CANNULA PART THRD DISP 5.75X7 (CANNULA) IMPLANT
CANNULA PARTIAL THREAD 2X7 (CANNULA) ×2 IMPLANT
CANNULA TWIST IN 8.25X7CM (CANNULA) ×2 IMPLANT
CANNULA TWIST IN 8.25X9CM (CANNULA) IMPLANT
CHLORAPREP W/TINT 26 (MISCELLANEOUS) ×2 IMPLANT
COOLER POLAR GLACIER W/PUMP (MISCELLANEOUS) ×2 IMPLANT
COVER WAND RF STERILE (DRAPES) ×2 IMPLANT
DERMABOND ADVANCED (GAUZE/BANDAGES/DRESSINGS)
DERMABOND ADVANCED .7 DNX12 (GAUZE/BANDAGES/DRESSINGS) IMPLANT
DRAPE 3/4 80X56 (DRAPES) ×2 IMPLANT
DRAPE IMP U-DRAPE 54X76 (DRAPES) ×4 IMPLANT
DRAPE INCISE IOBAN 66X45 STRL (DRAPES) ×2 IMPLANT
DRAPE U-SHAPE 47X51 STRL (DRAPES) ×4 IMPLANT
DRSG TEGADERM 4X4.75 (GAUZE/BANDAGES/DRESSINGS) ×6 IMPLANT
ELECT REM PT RETURN 9FT ADLT (ELECTROSURGICAL) ×2
ELECTRODE REM PT RTRN 9FT ADLT (ELECTROSURGICAL) ×1 IMPLANT
GAUZE SPONGE 4X4 12PLY STRL (GAUZE/BANDAGES/DRESSINGS) ×2 IMPLANT
GAUZE XEROFORM 1X8 LF (GAUZE/BANDAGES/DRESSINGS) ×2 IMPLANT
GLOVE BIO SURGEON STRL SZ7.5 (GLOVE) ×2 IMPLANT
GLOVE BIOGEL PI IND STRL 8 (GLOVE) ×2 IMPLANT
GLOVE BIOGEL PI INDICATOR 8 (GLOVE) ×2
GLOVE SURG ORTHO 8.0 STRL STRW (GLOVE) ×2 IMPLANT
GLOVE SURG SYN 8.0 (GLOVE) ×2 IMPLANT
GOWN STRL REUS W/ TWL LRG LVL3 (GOWN DISPOSABLE) ×2 IMPLANT
GOWN STRL REUS W/TWL LRG LVL3 (GOWN DISPOSABLE) ×4
GOWN STRL REUS W/TWL XL LVL4 (GOWN DISPOSABLE) ×2 IMPLANT
IV LACTATED RINGER IRRG 3000ML (IV SOLUTION) ×30
IV LR IRRIG 3000ML ARTHROMATIC (IV SOLUTION) ×15 IMPLANT
KIT CORKSCREW KNTLS 3.9 S/T/P (INSTRUMENTS) IMPLANT
KIT INSERTION 2.9 PUSHLOCK (KITS) ×2 IMPLANT
KIT STABILIZATION SHOULDER (MISCELLANEOUS) ×2 IMPLANT
KIT SUTURETAK 3.0 INSERT PERC (KITS) IMPLANT
KIT TURNOVER KIT A (KITS) ×2 IMPLANT
LASSO 45 DEG LEFT QUICKPASS (SUTURE) ×2 IMPLANT
MANIFOLD NEPTUNE II (INSTRUMENTS) ×2 IMPLANT
MASK FACE SPIDER DISP (MASK) ×2 IMPLANT
MAT ABSORB  FLUID 56X50 GRAY (MISCELLANEOUS) ×2
MAT ABSORB FLUID 56X50 GRAY (MISCELLANEOUS) ×2 IMPLANT
NDL MAYO CATGUT SZ5 (NEEDLE)
NDL SAFETY ECLIPSE 18X1.5 (NEEDLE) ×1 IMPLANT
NDL SUT 5 .5 CRC TPR PNT MAYO (NEEDLE) IMPLANT
NEEDLE HYPO 18GX1.5 SHARP (NEEDLE) ×2
NEEDLE SCORPION MULTI FIRE (NEEDLE) ×2 IMPLANT
PACK SHDR ARTHRO (MISCELLANEOUS) ×2 IMPLANT
PAD ARMBOARD 7.5X6 YLW CONV (MISCELLANEOUS) ×2 IMPLANT
PAD WRAPON POLAR SHDR XLG (MISCELLANEOUS) ×1 IMPLANT
PENCIL SMOKE ULTRAEVAC 22 CON (MISCELLANEOUS) ×2 IMPLANT
SET TUBE SUCT SHAVER OUTFL 24K (TUBING) ×2 IMPLANT
SET TUBE TIP INTRA-ARTICULAR (MISCELLANEOUS) ×2 IMPLANT
SLING ULTRA II M (MISCELLANEOUS) ×2 IMPLANT
STAPLER SKIN PROX 35W (STAPLE) IMPLANT
STRAP SAFETY 5IN WIDE (MISCELLANEOUS) ×2 IMPLANT
SUT ETHILON 2 0 FS 18 (SUTURE) ×2 IMPLANT
SUT ETHILON 3-0 (SUTURE) ×2 IMPLANT
SUT LASSO 90 DEG CVD (SUTURE) IMPLANT
SUT LASSO 90 DEG SD STR (SUTURE) IMPLANT
SUT MNCRL 4-0 (SUTURE)
SUT MNCRL 4-0 27XMFL (SUTURE)
SUT PROLENE 0 CT 2 (SUTURE) ×2 IMPLANT
SUT VIC AB 0 CT1 36 (SUTURE) IMPLANT
SUT VIC AB 2-0 CT2 27 (SUTURE) IMPLANT
SUTURE MNCRL 4-0 27XMF (SUTURE) IMPLANT
SUTURE TAPE 1.3 40 TPR END (SUTURE) ×1 IMPLANT
SUTURETAPE 1.3 40 TPR END (SUTURE) ×2
TAPE CLOTH 3X10 WHT NS LF (GAUZE/BANDAGES/DRESSINGS) ×2 IMPLANT
TAPE MICROFOAM 4IN (TAPE) ×2 IMPLANT
TUBING ARTHRO INFLOW-ONLY STRL (TUBING) ×2 IMPLANT
TUBING CONNECTING 10 (TUBING) IMPLANT
WAND WEREWOLF FLOW 90D (MISCELLANEOUS) ×2 IMPLANT
WRAPON POLAR PAD SHDR XLG (MISCELLANEOUS) ×2

## 2020-07-19 NOTE — H&P (Signed)
Paper H&P to be scanned into permanent record. H&P reviewed. No significant changes noted.  

## 2020-07-19 NOTE — Anesthesia Preprocedure Evaluation (Addendum)
Anesthesia Evaluation  Patient identified by MRN, date of birth, ID band Patient awake    Reviewed: Allergy & Precautions, NPO status , Patient's Chart, lab work & pertinent test results  History of Anesthesia Complications Negative for: history of anesthetic complications  Airway Mallampati: II  TM Distance: >3 FB Neck ROM: Full    Dental no notable dental hx. (+) Teeth Intact, Dental Advisory Given   Pulmonary asthma , sleep apnea , neg COPD, Patient abstained from smoking.Not current smoker, former smoker,  COVID pneumonia 09/2019 on ventilator for a week. Now back to baseline with no breathing issues Very mild asthma, inhaler couple times per year Mild OSA prior to gastric bypass, now she is 70lb lighter than that weight   Pulmonary exam normal breath sounds clear to auscultation       Cardiovascular Exercise Tolerance: Good METShypertension, (-) CAD and (-) Past MI (-) dysrhythmias  Rhythm:Regular Rate:Normal - Systolic murmurs    Neuro/Psych  Headaches, PSYCHIATRIC DISORDERS Anxiety Depression  Neuromuscular disease    GI/Hepatic neg GERD  ,(+)     (-) substance abuse  ,   Endo/Other  diabetesHypothyroidism Morbid obesity  Renal/GU negative Renal ROS     Musculoskeletal  (+) Arthritis , Fibromyalgia -  Abdominal (+) + obese,   Peds  Hematology  (+) anemia ,   Anesthesia Other Findings Past Medical History: 04/30/2019: Abscess of right buttock No date: Anemia No date: Anxiety No date: COVID-19 virus infection 2010: Depression No date: Diabetes mellitus 10/13/2019: DKA, type 2 ([place]) No date: Endometriosis No date: Headache, common migraine, intractable No date: History of kidney stones No date: History of ovarian cyst     Comment:  left sided, caused by her endometriosis (possibly an               endometrioma), in her 1s. No date: Hyperlipidemia No date: Hypertension No date: Kidney stones      Comment:  left No date: Morbid obesity ([place])     Comment:  s/p gastric bypass 2010: Personal hx of gastric bypass No date: Rosacea No date: Vertigo  Reproductive/Obstetrics                             Anesthesia Physical Anesthesia Plan  ASA: III  Anesthesia Plan: General   Post-op Pain Management:    Induction: Intravenous  PONV Risk Score and Plan: 4 or greater and Ondansetron and Dexamethasone  Airway Management Planned: Oral ETT  Additional Equipment: None  Intra-op Plan:   Post-operative Plan: Extubation in OR  Informed Consent: I have reviewed the patients History and Physical, chart, labs and discussed the procedure including the risks, benefits and alternatives for the proposed anesthesia with the patient or authorized representative who has indicated his/her understanding and acceptance.     Dental advisory given  Plan Discussed with: CRNA and Surgeon  Anesthesia Plan Comments: (Patient previously tolerated cephalosporins without issue, will administer cefazolin for prophylaxis.   Discussed risks of anesthesia with patient, including PONV, sore throat, lip/dental damage. Rare risks discussed as well, such as cardiorespiratory and neurological sequelae. Patient understands. Discussed r/b/a of interscalene block, including elective nature. Risks discussed: - Rare: bleeding, infection, nerve damage - shortness of breath from hemidiaphragmatic paralysis - unilateral horner's syndrome - poor/non-working blocks Patient understands and agrees. )       Anesthesia Quick Evaluation

## 2020-07-19 NOTE — Anesthesia Procedure Notes (Signed)
Procedure Name: Intubation Performed by: Gaynelle Cage, CRNA Pre-anesthesia Checklist: Patient identified, Emergency Drugs available, Suction available and Patient being monitored Patient Re-evaluated:Patient Re-evaluated prior to induction Oxygen Delivery Method: Circle system utilized Preoxygenation: Pre-oxygenation with 100% oxygen Induction Type: IV induction and Cricoid Pressure applied Ventilation: Mask ventilation without difficulty and Oral airway inserted - appropriate to patient size Laryngoscope Size: 3 and Mac Grade View: Grade II Tube type: Oral Tube size: 6.5 mm Number of attempts: 1 Airway Equipment and Method: Oral airway,  Stylet and Patient positioned with wedge pillow Placement Confirmation: ETT inserted through vocal cords under direct vision,  positive ETCO2 and breath sounds checked- equal and bilateral Secured at: 20 cm Tube secured with: Tape Dental Injury: Teeth and Oropharynx as per pre-operative assessment

## 2020-07-19 NOTE — Discharge Instructions (Signed)
Interscalene Nerve Block, Care After This sheet gives you information about how to care for yourself after your procedure. Your health care provider may also give you more specific instructions. If you have problems or questions, contact your health care provider. What can I expect after the procedure? After the procedure, it is common to have:  Soreness or tenderness in your neck.  Numbness in your shoulder, upper arm, and some fingers.  Weakness in your shoulder and arm muscles. The feeling and strength in your shoulder, arm, and fingers should return to normal within hours after your procedure. Follow these instructions at home: For at least 24 hours after the procedure:  Do not: ? Participate in activities in which you could fall or become injured. ? Drive. ? Use heavy machinery. ? Drink alcohol. ? Take sleeping pills or medicines that cause drowsiness. ? Make important decisions or sign legal documents. ? Take care of children on your own.  Rest. Eating and drinking  If you vomit, drink water, juice, or soup when you can drink without vomiting.  Make sure you have little or no nausea before eating solid foods.  Follow the diet that is recommended by your health care provider. If you have a sling:  Wear it as told by your health care provider. Remove it only as told by your health care provider.  Loosen the sling if your fingers tingle, become numb, or turn cold and blue.  Make sure that your entire arm, including your wrist, is supported. Do not allow your wrist to dangle over the end of the sling.  Do not let your sling get wet if it is not waterproof.  Keep the sling clean. Bathing  Do not take baths, swim, or use a hot tub until your health care provider approves.  If you have a nerve block catheter in place, keep the incision site and tubing dry. Injection site care   Wash your hands with soap and water before you change your bandage (dressing). If soap and  water are not available, use hand sanitizer.  Change your dressing as told by your health care provider.  Keep your dressing dry.  Check your nerve block injection site every day for signs of infection. Check for: ? Redness, swelling, or pain. ? Fluid or blood. ? Warmth. Activity  Do not perform complex or risky activities while taking prescription pain medicine and until you have fully recovered.  Return to your normal activities as told by your health care provider and as you can tolerate them. Ask your health care provider what activities are safe for you.  Rest and take it easy. This will help you heal and recover more quickly and fully.  Be very cautious until you have regained strength and sensation. General instructions  Have a responsible adult stay with you until you are awake and alert.  Do not drive or use heavy machinery while taking prescription pain medicine and until you have fully recovered. Ask your health care provider when it is safe to drive.  Take over-the-counter and prescription medicines only as told by your health care provider.  If you smoke, do not smoke without supervision.  Do not expose your arm or shoulder to very cold or very hot temperatures until you have full feeling back.  If you have a nerve block catheter in place: ? Try to keep the catheter from getting kinked or pinched. ? Avoid pulling or tugging on the catheter.  Keep all follow-up visits as told  by your health care provider. This is important. Contact a health care provider if:  You have chills or fever.  You have redness, swelling, or pain around your injection site.  You have fluid or blood coming from the injection site.  The skin around the injection site is warm to the touch.  There is a bad smell coming from your dressing.  You have hoarseness or a drooping or dry eye that lasts more than a few days.  You have pain that is poorly controlled with the block or with pain  medicine.  You have numbness, tingling, or weakness in your shoulder or arm that lasts for more than one week. Get help right away if:  You have severe pain.  You lose or do not regain strength and sensation in your arm even after the nerve block medicine has stopped.  You have trouble breathing.  You have a nerve block catheter still in place and you begin to shiver.  You have a nerve block catheter still in place and you are getting more and more numb or weak. This information is not intended to replace advice given to you by your health care provider. Make sure you discuss any questions you have with your health care provider. Document Revised: 11/30/2017 Document Reviewed: 07/28/2016 Elsevier Patient Education  2020 [employer]   1) The drugs that you were given will stay in your system until tomorrow so for the next 24 hours you should not:  A) Drive an automobile B) Make any legal decisions C) Drink any alcoholic beverage   2) You may resume regular meals tomorrow.  Today it is better to start with liquids and gradually work up to solid foods.  You may eat anything you prefer, but it is better to start with liquids, then soup and crackers, and gradually work up to solid foods.   3) Please notify your doctor immediately if you have any unusual bleeding, trouble breathing, redness and pain at the surgery site, drainage, fever, or pain not relieved by medication.    4) Additional Instructions:        Please contact your physician with any problems or Same Day Surgery at [phone], Monday through Friday 6 am to 4 pm, or  at [facility] number at [phone].Post-Op Instructions - Rotator Cuff Repair  1. Bracing: You will wear a shoulder immobilizer or sling for 4 weeks.   2. Driving: No driving for 4 weeks post-op. When driving, do not wear the immobilizer. Ideally, we recommend no driving for 6 weeks  while sling is in place as one arm will be immobilized.   3. Activity: No active lifting for 2 months. Wrist, hand, and elbow motion only. Avoid lifting the upper arm away from the body except for hygiene. You are permitted to bend and straighten the elbow passively only (no active elbow motion). You may use your hand and wrist for typing, writing, and managing utensils (cutting food). Do not lift more than a coffee cup for 8 weeks.  When sleeping or resting, inclined positions (recliner chair or wedge pillow) and a pillow under the forearm for support may provide better comfort for up to 4 weeks.  Avoid long distance travel for 4 weeks.  Return to normal activities after rotator cuff repair repair normally takes 6 months on average. If rehab goes very well, may be able to do most activities at 4 months, except overhead or contact sports.  4. Physical  Therapy: Begins 3-4 days after surgery, and proceed 1 time per week for the first 6 weeks, then 1-2 times per week from weeks 6-20 post-op.  5. Medications:  - You will be provided a prescription for narcotic pain medicine. After surgery, take 1-2 narcotic tablets every 4 hours if needed for severe pain.  - A prescription for anti-nausea medication will be provided in case the narcotic medicine causes nausea - take 1 tablet every 6 hours only if nauseated.   - Take tylenol 1000 mg (2 Extra Strength tablets or 3 regular strength) every 8 hours for pain.  May decrease or stop tylenol 5 days after surgery if you are having minimal pain. - Take ASA 325mg /day x 2 weeks to help prevent DVTs/PEs (blood clots).  - DO NOT take ANY nonsteroidal anti-inflammatory pain medications (Advil, Motrin, Ibuprofen, Aleve, Naproxen, or Naprosyn). These medicines can inhibit healing of your shoulder repair.    If you are taking prescription medication for anxiety, depression, insomnia, muscle spasm, chronic pain, or for attention deficit disorder, you are advised that you  are at a higher risk of adverse effects with use of narcotics post-op, including narcotic addiction/dependence, depressed breathing, death. If you use non-prescribed substances: alcohol, marijuana, cocaine, heroin, methamphetamines, etc., you are at a higher risk of adverse effects with use of narcotics post-op, including narcotic addiction/dependence, depressed breathing, death. You are advised that taking > 50 morphine milligram equivalents (MME) of narcotic pain medication per day results in twice the risk of overdose or death. For your prescription provided: oxycodone 5 mg - taking more than 6 tablets per day would result in > 50 morphine milligram equivalents (MME) of narcotic pain medication. Be advised that we will prescribe narcotics short-term, for acute post-operative pain only - 3 weeks for major operations such as shoulder repair/reconstruction surgeries.     6. Post-Op Appointment:  Your first post-op appointment will be 10-14 days post-op.  7. Work or School: For most, but not all procedures, we advise staying out of work or school for at least 1 to 2 weeks in order to recover from the stress of surgery and to allow time for healing.   If you need a work or school note this can be provided.   8. Smoking: If you are a smoker, you need to refrain from smoking in the postoperative period. The nicotine in cigarettes will inhibit healing of your shoulder repair and decrease the chance of successful repair. Similarly, nicotine containing products (gum, patches) should be avoided.   Post-operative Brace: Apply and remove the brace you received as you were instructed to at the time of fitting and as described in detail as the brace's instructions for use indicate.  Wear the brace for the period of time prescribed by your physician.  The brace can be cleaned with soap and water and allowed to air dry only.  Should the brace result in increased pain, decreased feeling (numbness/tingling),  increased swelling or an overall worsening of your medical condition, please contact your doctor immediately.  If an emergency situation occurs as a result of wearing the brace after normal business hours, please dial 911 and seek immediate medical attention.  Let your doctor know if you have any further questions about the brace issued to you. Refer to the shoulder sling instructions for use if you have any questions regarding the correct fit of your shoulder sling.  [facility]: [phone]  Video that illustrates how to properly use a shoulder  sling: "Instructions for Proper Use of an Orthopaedic Sling" [email or web address]

## 2020-07-19 NOTE — Anesthesia Procedure Notes (Signed)
Anesthesia Regional Block: Interscalene brachial plexus block   Pre-Anesthetic Checklist: ,, timeout performed, Correct Patient, Correct Site, Correct Laterality, Correct Procedure, Correct Position, site marked, Risks and benefits discussed,  Surgical consent,  Pre-op evaluation,  At surgeon's request and post-op pain management  Laterality: Right  Prep: chloraprep       Needles:  Injection technique: Single-shot  Needle Type: Echogenic Needle     Needle Length: 4cm  Needle Gauge: 25     Additional Needles:   Narrative:  Start time: 07/19/2020 9:55 AM End time: 07/19/2020 10:02 AM Injection made incrementally with aspirations every 5 mL.  Performed by: Personally  Anesthesiologist: Arita Miss, MD  Additional Notes: Patient consented for risk and benefits of nerve block including but not limited to: nerve damage, failed block, bleeding and infection, hemidiaphragmatic paralysis and shortness of breath, Horner's syndrome.  Patient voiced understanding.  Functioning IV was confirmed and monitors were applied.  Sterile prep,hand hygiene and sterile gloves were used.  Minimal sedation used for procedure.   No paresthesia endorsed by patient during the procedure.  Negative aspiration and negative test dose prior to incremental administration of local anesthetic. The patient tolerated the procedure well with no immediate complications.

## 2020-07-19 NOTE — Transfer of Care (Signed)
Immediate Anesthesia Transfer of Care Note  Patient: Maria Bentley  Procedure(s) Performed: Right shoulder arthroscopic vs mini-open rotator cuff repair vs Regeneten patch application, subacomial dcompression, and biceps tenodesis (Right )  Patient Location: PACU  Anesthesia Type:GA combined with regional for post-op pain  Level of Consciousness: awake  Airway & Oxygen Therapy: Patient Spontanous Breathing and Patient connected to face mask oxygen  Post-op Assessment: Report given to RN and Post -op Vital signs reviewed and stable  Post vital signs: Reviewed and stable  Last Vitals:  Vitals Value Taken Time  BP 107/64 07/19/20 1424  Temp 36.3 C 07/19/20 1423  Pulse 89 07/19/20 1427  Resp 14 07/19/20 1427  SpO2 100 % 07/19/20 1427  Vitals shown include unvalidated device data.  Last Pain:  Vitals:   07/19/20 0851  PainSc: 3          Complications: No complications documented.

## 2020-07-19 NOTE — Anesthesia Postprocedure Evaluation (Signed)
Anesthesia Post Note  Patient: Maria Bentley  Procedure(s) Performed: Right shoulder arthroscopic vs mini-open rotator cuff repair vs Regeneten patch application, subacomial dcompression, and biceps tenodesis (Right )  Patient location during evaluation: PACU Anesthesia Type: General Level of consciousness: awake and alert Pain management: pain level controlled Vital Signs Assessment: post-procedure vital signs reviewed and stable Respiratory status: spontaneous breathing, nonlabored ventilation, respiratory function stable and patient connected to nasal cannula oxygen Cardiovascular status: blood pressure returned to baseline and stable Postop Assessment: no apparent nausea or vomiting Anesthetic complications: no   No complications documented.   Last Vitals:  Vitals:   07/19/20 1455 07/19/20 1500  BP: (!) 95/56   Pulse: 88 85  Resp: (!) 22 18  Temp:    SpO2: 97% 96%    Last Pain:  Vitals:   07/19/20 1453  PainSc: 0-No pain                 Arita Miss

## 2020-07-19 NOTE — Op Note (Signed)
SURGERY DATE: 07/19/2020   PRE-OP DIAGNOSIS:  1. Right subacromial impingement 2. Right biceps tendinopathy 3. Right rotator cuff tear  POST-OP DIAGNOSIS: 1. Right subacromial impingement 2. Right biceps tendinopathy 3. Right rotator cuff tear  PROCEDURES:  1. Right arthroscopic rotator cuff repair 2. Right arthroscopic biceps tenodesis 3. Right subacromial decompression 4. Right extensive debridement of shoulder (glenohumeral and subacromial spaces)  SURGEON: Cato Mulligan, MD  ASSISTANT: Reche Dixon, PA; Gaynelle Arabian, PA-S  ANESTHESIA: Gen with Exparel interscalene block  ESTIMATED BLOOD LOSS: 5cc  DRAINS:  none  TOTAL IV FLUIDS: per anesthesia   SPECIMENS: none  IMPLANTS:   - Arthrex 2.93mm Pushlock - x1 - Arthrex 5.20mm SwiveLock - x2    OPERATIVE FINDINGS:  Examination under anesthesia: A careful examination under anesthesia was performed.  Passive range of motion was: FF: 160; ER at side: 45; ER in abduction: 90; IR in abduction: 45.  Anterior load shift: NT.  Posterior load shift: NT.  Sulcus in neutral: NT.  Sulcus in ER: NT.    Intra-operative findings: A thorough arthroscopic examination of the shoulder was performed.  The findings are: 1. Biceps tendon: tendinopathy with significant erythema at the biceps anchor 2. Superior labrum: Normal 3. Posterior labrum and capsule: normal 4. Inferior capsule and inferior recess: normal 5. Glenoid cartilage surface: areas of grade 2-3 degenerative changes posteriorly 6. Supraspinatus attachment: High-grade articular sided tear involving ~80% of articular surface 7. Posterior rotator cuff attachment: normal 8. Humeral head articular cartilage: large, focal area of grade 2-3 degenerative changes 9. Rotator interval: Significant synovitis 10: Subscapularis tendon: attachment intact 11. Anterior labrum: Mild degeneration 12. IGHL: normal  OPERATIVE REPORT:   Indications for procedure: Maria Bentley is a 49  y.o. female who has had approximately 1.5 years of shoulder pain.  Of note she has had prior right shoulder arthroscopic subacromial decompression and distal clavicle excision in 2008.  She did well initially after the procedure.  Clinical exam and MRI were concerning for high-grade rotator cuff tear, biceps tendinopathy, and subacromial impingement.  She underwent a course of nonoperative management in the form of activity modifications, medical management, physical therapy, and corticosteroid injection without significant improvement in her symptoms.  Corticosteroid injection gave the patient excellent relief of her symptoms, but only for 1 week.  Given findings and failure of nonoperative management, we elected to proceed with surgical intervention after discussion of risks, benefits, and alternatives.   Procedure in detail: I identified [name] in the pre-operative holding area.  I marked the operative shoulder with my initials. I reviewed the risks and benefits of the proposed surgical intervention, and the patient (and/or patient's guardian) wished to proceed.  Anesthesia was performed with an Exparel interscalene block.  The patient was transferred to the operative suite and placed in the beach chair position.    SCDs were placed on the lower extremities. Appropriate IV antibiotics were administered within 1 hour before incision. The operative upper extremity was then prepped and draped in standard fashion. A time out was performed confirming the correct extremity, correct patient and correct procedure.   I then created a standard posterior portal with an 11 blade. The glenohumeral joint was easily entered with a blunt trochar and the arthroscope introduced. The findings of diagnostic arthroscopy are described above. I debrided degenerative tissue including labrum and cartilaginous surfaces and also debrided and coagulated the inflamed synovium to obtain hemostasis and reduce the risk of  post-operative swelling using an Arthrocare radiofrequency device.  I then turned my attention to the arthroscopic biceps tenodesis.  I used the loop n tack technique to pass a fiber tape through the biceps in a locked fashion adjacent to the biceps anchor.  A hole for a 2.9 mm Arthrex PushLock was drilled in the bicipital groove just superior to the subscapularis tendon insertion.  The fiber tape was loaded onto the push lock anchor and impacted into place into the previously drilled hole in the bicipital groove.  This appropriately secured the biceps into the bicipital groove and took it off of tension.  I then passed 2 sutures through a spinal needle into the anterior and posterior aspects of the high-grade articular sided tear for referencing in the subacromial space.  Next, the arthroscope was then introduced into the subacromial space. A direct lateral portal was created with an 11-blade after spinal needle localization. An extensive subacromial bursectomy was performed using a combination of the shaver and Arthrocare wand. The entire acromial undersurface was exposed and the CA ligament was subperiosteally elevated to expose the anterior acromial hook. A burr was used to create a flat anterior and lateral aspect of the acromion, converting it from a Type 2 to a Type 1 acromion. Care was made to keep the deltoid fascia intact.  The sutures marking the anterior and posterior borders of the articular sided tear were identified.  A probe was used to examine the quality of the bursal sided rotator cuff tissue between the sutures.  The probe was easily able to be pushed into the glenohumeral joint suggestive of poor quality bursal sided tissue.  A shaver was used to debride the incompetent bursal side of the rotator cuff.  This resulted in a V-shaped tear of the anterior supraspinatus.  I created an accessory posterolateral portal to assist with visualization I debrided the poor quality edges of the  supraspinatus tendon with an oscillating shaver. The soft tissue from the footprint was cleared with an ArthroCare wand. I prepared the footprint using a burr to expose bleeding bone.   I then percutaneously placed 1 medial row anchor.  I first attempted to place an Iconix Speed anchor, but this pulled out of the bone.  Therefore the sutures from this anchor were loaded onto a 5.5 mm SwiveLock, and this was appropriately impacted into place.  This achieved excellent fixation.  I then used a scorpion suture passer to pass 2 SutureTapes in a margin convergence fashion.  These were tied arthroscopically.  This converted the tear to a small U-shaped tear.  I then shuttled all 4 strands of tape from the medial row anchor through the rotator cuff just lateral to the musculotendinous junction using a scorpion suture passer.  All 4 limbs were passed through a 5.5 mm SwiveLock anchor.  This was then placed 1 cm distal to the edge of the greater tuberosity in line with the rotator cuff tear.  There was a small area of the rotator cuff that was not compressed down to the footprint.  Therefore the FiberWire sutures from the lateral row anchor were shuttled in a mattress fashion (1 anterior and one posterior) using a suture lasso.  This was tied arthroscopically and cut.  This construct afforded appropriate reduction of the rotator cuff tear with homogeneous compression of the tendon across the prepared footprint.  It was stable to external and internal rotation.  Fluid was evacuated from the shoulder, and the portals were closed with 3-0 Nylon. Xeroform was applied to the portals. A sterile dressing  was applied, followed by a Polar Care sleeve and a SlingShot shoulder immobilizer/sling. The patient awoke from anesthesia without difficulty and was transferred to the PACU in stable condition.     COMPLICATIONS: none  DISPOSITION: plan for discharge home after recovery in PACU   POSTOPERATIVE PLAN: Remain in sling  (except hygiene and elbow/wrist/hand RoM exercises as instructed by PT) x 4 weeks and NWB for this time.  Use small/medium rotator cuff tear rehab protocol.  PT to begin 3-4 days after surgery.  Follow-up with me as an outpatient in approximately 2 weeks.

## 2020-07-20 ENCOUNTER — Encounter: Payer: Self-pay | Admitting: Orthopedic Surgery

## 2020-07-22 ENCOUNTER — Other Ambulatory Visit: Payer: Self-pay

## 2020-07-22 ENCOUNTER — Emergency Department: Payer: BC Managed Care – PPO

## 2020-07-22 ENCOUNTER — Telehealth: Payer: Self-pay | Admitting: Internal Medicine

## 2020-07-22 ENCOUNTER — Observation Stay
Admission: EM | Admit: 2020-07-22 | Discharge: 2020-07-23 | Disposition: A | Discharge status: Home / Self Care | Payer: BC Managed Care – PPO | Attending: Internal Medicine | Admitting: Internal Medicine

## 2020-07-22 DIAGNOSIS — E119 Type 2 diabetes mellitus without complications: Secondary | ICD-10-CM | POA: Diagnosis not present

## 2020-07-22 DIAGNOSIS — E11649 Type 2 diabetes mellitus with hypoglycemia without coma: Secondary | ICD-10-CM

## 2020-07-22 DIAGNOSIS — R0902 Hypoxemia: Principal | ICD-10-CM | POA: Insufficient documentation

## 2020-07-22 DIAGNOSIS — Z20822 Contact with and (suspected) exposure to covid-19: Secondary | ICD-10-CM | POA: Insufficient documentation

## 2020-07-22 DIAGNOSIS — R0602 Shortness of breath: Secondary | ICD-10-CM | POA: Diagnosis present

## 2020-07-22 DIAGNOSIS — E785 Hyperlipidemia, unspecified: Secondary | ICD-10-CM | POA: Insufficient documentation

## 2020-07-22 DIAGNOSIS — Z7982 Long term (current) use of aspirin: Secondary | ICD-10-CM | POA: Diagnosis not present

## 2020-07-22 DIAGNOSIS — Z79899 Other long term (current) drug therapy: Secondary | ICD-10-CM | POA: Diagnosis not present

## 2020-07-22 DIAGNOSIS — I10 Essential (primary) hypertension: Secondary | ICD-10-CM | POA: Diagnosis not present

## 2020-07-22 DIAGNOSIS — F329 Major depressive disorder, single episode, unspecified: Secondary | ICD-10-CM | POA: Insufficient documentation

## 2020-07-22 DIAGNOSIS — R0600 Dyspnea, unspecified: Secondary | ICD-10-CM

## 2020-07-22 LAB — CBC WITH DIFFERENTIAL/PLATELET
Abs Immature Granulocytes: 0.06 10*3/uL (ref 0.00–0.07)
Basophils Absolute: 0.1 10*3/uL (ref 0.0–0.1)
Basophils Relative: 1 %
Eosinophils Absolute: 0.2 10*3/uL (ref 0.0–0.5)
Eosinophils Relative: 2 %
HCT: 38.3 % (ref 36.0–46.0)
Hemoglobin: 11.4 g/dL — ABNORMAL LOW (ref 12.0–15.0)
Immature Granulocytes: 1 %
Lymphocytes Relative: 30 %
Lymphs Abs: 3.2 10*3/uL (ref 0.7–4.0)
MCH: 29.2 pg (ref 26.0–34.0)
MCHC: 29.8 g/dL — ABNORMAL LOW (ref 30.0–36.0)
MCV: 98 fL (ref 80.0–100.0)
Monocytes Absolute: 0.7 10*3/uL (ref 0.1–1.0)
Monocytes Relative: 7 %
Neutro Abs: 6.4 10*3/uL (ref 1.7–7.7)
Neutrophils Relative %: 59 %
Platelets: 404 10*3/uL — ABNORMAL HIGH (ref 150–400)
RBC: 3.91 MIL/uL (ref 3.87–5.11)
RDW: 16.4 % — ABNORMAL HIGH (ref 11.5–15.5)
WBC: 10.6 10*3/uL — ABNORMAL HIGH (ref 4.0–10.5)
nRBC: 0.2 % (ref 0.0–0.2)

## 2020-07-22 LAB — COMPREHENSIVE METABOLIC PANEL
ALT: 27 U/L (ref 0–44)
AST: 20 U/L (ref 15–41)
Albumin: 2.7 g/dL — ABNORMAL LOW (ref 3.5–5.0)
Alkaline Phosphatase: 123 U/L (ref 38–126)
Anion gap: 10 (ref 5–15)
BUN: 20 mg/dL (ref 6–20)
CO2: 24 mmol/L (ref 22–32)
Calcium: 8.1 mg/dL — ABNORMAL LOW (ref 8.9–10.3)
Chloride: 102 mmol/L (ref 98–111)
Creatinine, Ser: 1.2 mg/dL — ABNORMAL HIGH (ref 0.44–1.00)
GFR calc Af Amer: >60 mL/min (ref >60)
GFR calc non Af Amer: 53 mL/min — ABNORMAL LOW (ref >60)
Glucose, Bld: 136 mg/dL — ABNORMAL HIGH (ref 70–99)
Potassium: 4.5 mmol/L (ref 3.5–5.1)
Sodium: 136 mmol/L (ref 135–145)
Total Bilirubin: 0.6 mg/dL (ref 0.3–1.2)
Total Protein: 6.1 g/dL — ABNORMAL LOW (ref 6.5–8.1)

## 2020-07-22 LAB — TROPONIN I (HIGH SENSITIVITY): Troponin I (High Sensitivity): 9 ng/L (ref <18)

## 2020-07-22 LAB — BRAIN NATRIURETIC PEPTIDE: B Natriuretic Peptide: 255.8 pg/mL — ABNORMAL HIGH (ref 0.0–100.0)

## 2020-07-22 LAB — SARS CORONAVIRUS 2 BY RT PCR (HOSPITAL ORDER, PERFORMED IN ~~LOC~~ HOSPITAL LAB): SARS Coronavirus 2: NEGATIVE

## 2020-07-22 MED ORDER — ACETAMINOPHEN 325 MG PO TABS: 650.0000 mg | ORAL_TABLET | Freq: Once | ORAL | Status: AC

## 2020-07-22 MED ORDER — ACETAMINOPHEN 325 MG PO TABS
ORAL_TABLET | ORAL | Status: AC
  Administered 2020-07-22: 650 mg via ORAL
  Filled 2020-07-22: qty 2

## 2020-07-22 MED ORDER — IOHEXOL 350 MG/ML SOLN
75.0000 mL | Freq: Once | INTRAVENOUS | Status: AC | PRN
  Administered 2020-07-22: 75 mL via INTRAVENOUS

## 2020-07-22 NOTE — ED Triage Notes (Signed)
Pt states had right rotator cuff repair on Monday and has been short of breath since Monday night. Pt with pwd skin, able to talk in comoplete sentences in no acute distress.

## 2020-07-22 NOTE — ED Notes (Signed)
Ambulated patient with O2 saturation. Pt saturation dropped to 88% on room air while ambulating. Pt returned to stretcher and placed on 3L via nasal cannula. Pt resting comfortably,

## 2020-07-22 NOTE — ED Notes (Signed)
Pt placed on oxygen at 1lpm via [place]

## 2020-07-22 NOTE — Telephone Encounter (Signed)
Agree with plan and advice to go call the surgeon and head to ED

## 2020-07-22 NOTE — ED Notes (Signed)
Spoke with dr. Archie Balboa regarding pt's symptoms, pox on ra and ?need for dimer, no order for dimer received.

## 2020-07-22 NOTE — Telephone Encounter (Signed)
Surgery was on Monday, she received the nerve block interscalene, so one of her lungs was numb. Patient is seeing and feeling her lung inflate on the nerve block side now. But she is still having short of breath. Walking into PT office today her o2 was at 85% then rose and stayed at 90%. Patient is worried if the nerve block could've affected her. Instructed patient to call her surgeon to inform them what is going on. Also instructed patient needs to be evaluated at ED due to her sx. She is very labored breathing while on the phone and unable to complete a sentence no there sx that are outside of the norm after surgery. Spoken with Dr Olivia Mackie about patient and she agreed with assessment, for patient to go to ED to be evaluated.

## 2020-07-22 NOTE — ED Provider Notes (Signed)
[facility] Emergency Department Provider Note   ____________________________________________   None    (approximate)  I have reviewed the triage vital signs and the nursing notes.   HISTORY  Chief Complaint Shortness of Breath    HPI Maria Bentley is a 49 y.o. female who had surgery on her rotator cuff on the ninth.  She had a scalene block.  She has been short of breath ever since.  The shortness of breath waxes and wanes.  When she walks here in the emergency room she desats to 88.  She is not coughing or running a fever.  He is not having any chest pain or tightness.  Her shortness of breath dates from the surgery.         Past Medical History:  Diagnosis Date  . Abscess of right buttock 04/30/2019  . Anemia   . Anxiety   . COVID-19 virus infection   . Depression 2010  . Diabetes mellitus   . DKA, type 2 ([place]) 10/13/2019  . Endometriosis   . Headache, common migraine, intractable   . History of kidney stones   . History of ovarian cyst    left sided, caused by her endometriosis (possibly an endometrioma), in her 28s.  Marland Kitchen Hyperlipidemia   . Hypertension   . Kidney stones    left  . Morbid obesity ([place])    s/p gastric bypass  . Personal hx of gastric bypass 2010  . Rosacea   . Vertigo     Patient Active Problem List   Diagnosis Date Noted  . Renal mass, left 07/06/2020  . Aspiration into airway 06/16/2020  . Paronychia of great toe, right 06/16/2020  . Acute renal failure (ARF) ([place]) 06/02/2020  . Lactic acidosis 05/19/2020  . Abdominal distension 05/18/2020  . Chronic right shoulder pain 02/09/2020  . Insulin use (long-term) in type 2 diabetes ([place]) 01/26/2020  . 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 Recurrent headache 01/13/2020  . 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 Elevated alkaline phosphatase level 11/21/2019  . Hospital discharge follow-up 11/04/2019  . Cervical spondylosis without  myelopathy 10/29/2019  . Pressure injury of skin 10/16/2019  . History of gastric bypass 10/13/2019  . COVID-19 virus infection 10/06/2019  . Preoperative evaluation of a medical condition to rule out surgical contraindications (TAR required) 09/21/2019  . Fibromyalgia 06/05/2018  . Iron deficiency anemia 06/05/2018  . Diarrhea 04/21/2018  . Paronychia of great toe of left foot 11/24/2017  . Hypertension 11/24/2017  . Hip pain, chronic 12/28/2015  . Pain in joint, ankle and foot 12/28/2015  . 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 Right hip pain 05/30/2014  . Vitamin D deficiency 05/30/2014  . Tachycardia 08/15/2013  . Diabetes mellitus type 2, uncontrolled ([place]) 08/15/2013  . 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  . Morbid obesity ([place]) 06/09/2012  . Anxiety   . Headache, common migraine, intractable   . Hyperlipidemia   . Hypothyroid   . OSA (obstructive sleep apnea) 02/05/2012    Past Surgical History:  Procedure Laterality Date  . DILATION AND CURETTAGE OF UTERUS  2011  . gastric bypass    . OVARIAN CYST REMOVAL    . ROTATOR CUFF REPAIR     right  . SHOULDER ARTHROSCOPY WITH SUBACROMIAL DECOMPRESSION AND OPEN ROTATOR C Right 07/19/2020   Procedure: Right shoulder arthroscopic vs mini-open rotator cuff repair vs Regeneten patch application, subacomial dcompression, and biceps tenodesis;  Surgeon: Leim Fabry, MD;  Location: [facility] ORS;  Service: Orthopedics;  Laterality: Right;    Prior to Admission medications   Medication Sig Start Date End Date Taking? Authorizing Provider  acetaminophen (TYLENOL) 500 MG tablet Take 2 tablets (1,000 mg total) by mouth every 8 (eight) hours. 07/19/20 07/19/21  Leim Fabry, MD  albuterol (VENTOLIN HFA) 108 (90 Base) MCG/ACT inhaler Inhale 2 puffs into the lungs every 6 (six) hours as needed for wheezing or shortness of breath. 10/02/19   Terald Sleeper, PA-C  amitriptyline (ELAVIL) 10 MG tablet TAKE 1 TABLET BY MOUTH EVERYDAY  AT BEDTIME 07/19/20   [name], MD  aspirin EC 325 MG tablet Take 1 tablet (325 mg total) by mouth daily for 14 days. 07/19/20 08/02/20  Leim Fabry, MD  aspirin-acetaminophen-caffeine [facility] MIGRAINE) [phone] MG tablet Take 2 tablets by mouth every 6 (six) hours as needed for headache.    [provider]  atorvastatin (LIPITOR) 20 MG tablet TAKE 1 TABLET BY MOUTH EVERY DAY Patient taking differently: Take 20 mg by mouth daily.  05/16/19   [name], MD  azelastine (ASTELIN) 0.1 % nasal spray Place 2 sprays into both nostrils daily as needed for rhinitis or allergies.  12/22/19   [provider]  blood glucose meter kit and supplies KIT Dispense based on patient and insurance preference. Use up to four times daily as directed. (FOR ICD-E11.21) 08/22/16   [name], MD  buPROPion (WELLBUTRIN XL) 150 MG 24 hr tablet Take 450 mg by mouth daily.  05/12/18   [provider]  CLINPRO 5000 1.1 % PSTE Place 1 application onto teeth at bedtime.  06/15/20   [provider]  Continuous Blood Gluc Receiver (FREESTYLE LIBRE 14 DAY READER) DEVI 1 applicator by Does not apply route 4 (four) times daily. 11/24/17   [name], MD  Continuous Blood Gluc Sensor (FREESTYLE LIBRE 14 DAY SENSOR) MISC Inject 1 application into the skin 4 (four) times daily as needed (uncontrolled diabetes). 09/19/19   [name], MD  cyanocobalamin (,VITAMIN B-12,) 1000 MCG/ML injection INJECT 1 ML (1,000 MCG TOTAL) INTO THE MUSCLE ONCE A WEEK. FOR 4 WEEKS, THEN MONTHLY THEREAFTER 05/07/20   [name], MD  cyclobenzaprine (FLEXERIL) 10 MG tablet Take 5-10 mg by mouth 3 (three) times daily as needed for muscle spasms.  05/21/18   [provider]  diltiazem (CARDIZEM CD) 300 MG 24 hr capsule TAKE 1 CAPSULE BY MOUTH EVERY DAY Patient not taking: Reported on 07/08/2020 03/17/20   Loel Dubonnet, NP  diphenhydrAMINE (BENADRYL) 25 MG tablet Take 25 mg by mouth every 6 (six)  hours as needed for allergies.    [provider]  EPINEPHrine 0.3 mg/0.3 mL IJ SOAJ injection Inject 0.3 mg into the muscle as needed for anaphylaxis.  06/25/10   [provider]  escitalopram (LEXAPRO) 20 MG tablet Take 20 mg by mouth daily.  02/27/17   [provider]  fluocinonide cream (LIDEX) 0.05 % Apply sparingly up to 2 times a day-only on the  rash Patient not taking: Reported on 07/08/2020 03/25/20   Marval Regal, NP  fluticasone (FLONASE) 50 MCG/ACT nasal spray SPRAY 2 SPRAYS INTO EACH NOSTRIL EVERY DAY Patient taking differently: Place 2 sprays into both nostrils daily.  05/27/20   [name], MD  furosemide (LASIX) 20 MG tablet Take 1 tablet (20 mg total) by mouth daily. As needed for fluid retention Patient taking differently: Take 20 mg by mouth daily  as needed for fluid.  06/15/20   [name], MD  HORIZANT 600 MG TBCR Take 600 mg by mouth 2 (two) times daily.  12/24/19   [provider]  Insulin Glargine (BASAGLAR KWIKPEN) 100 UNIT/ML SOPN Inject 0.5 mLs (50 Units total) into the skin daily. Patient taking differently: Inject 33 Units into the skin at bedtime.  12/11/19   [name], MD  Insulin Pen Needle (BD PEN NEEDLE NANO U/F) 32G X 4 MM MISC Please specify directions, refills and quantity 08/22/18   [name], MD  lamoTRIgine (LAMICTAL) 150 MG tablet Take 150 mg by mouth daily.    [provider]  Lancet Device MISC Test blood sugar two times daily 07/29/12   [name], MD  levothyroxine (SYNTHROID) 50 MCG tablet Take 1 tablet (50 mcg total) by mouth daily. 01/26/20   [name], MD  loperamide (IMODIUM) 2 MG capsule Take 2 capsules (4 mg total) by mouth as needed for diarrhea or loose stools. 10/25/19   Amin, Jeanella Flattery, MD  LORazepam (ATIVAN) 0.5 MG tablet Take 0.5-1 mg by mouth daily as needed for anxiety.     [provider]  meclizine (ANTIVERT) 25 MG tablet Take 1 tablet (25 mg total) by  mouth 3 (three) times daily as needed for dizziness. 06/16/15   [name], MD  Melatonin 10 MG TABS Take 10 mg by mouth at bedtime as needed (sleep).    [provider]  metolazone (ZAROXOLYN) 2.5 MG tablet Take daily, 30 minutes before furosemide Patient not taking: Reported on 07/08/2020 05/18/20   [name], MD  metoprolol tartrate (LOPRESSOR) 25 MG tablet Take 1 tablet (25 mg total) by mouth 2 (two) times daily. 06/15/20   [name], MD  NOVOLOG FLEXPEN 100 UNIT/ML FlexPen INJECT 20 UNITS INTO THE SKIN 3 TIMES DAILY WITH MEALS Patient taking differently: Inject 5-20 Units into the skin 3 (three) times daily with meals. Inject 15 units in the morning, 20 units at lunch and 5-10 units with dinner 05/11/20   [name], MD  nystatin (MYCOSTATIN/NYSTOP) powder APPLY 1 APPLICATION TO AFFECTED AREA TWICE A DAY Patient taking differently: Apply 1 application topically daily as needed (irritation).  06/15/20   [name], MD  OnabotulinumtoxinA (BOTOX IJ) Inject as directed every 3 (three) months. For migraine    [provider]  ondansetron (ZOFRAN ODT) 4 MG disintegrating tablet Take 1 tablet (4 mg total) by mouth every 8 (eight) hours as needed for nausea or vomiting. 07/19/20   Leim Fabry, MD  oxyCODONE (ROXICODONE) 5 MG immediate release tablet Take 1-2 tablets (5-10 mg total) by mouth every 4 (four) hours as needed (pain). 07/19/20 07/19/21  Leim Fabry, MD  promethazine (PHENERGAN) 25 MG tablet Take 25 mg by mouth every 6 (six) hours as needed for nausea or vomiting.  07/01/20   [provider]  Rimegepant Sulfate (NURTEC PO) Take by mouth as needed.    [provider]  SUMAtriptan (IMITREX) 100 MG tablet Take 100 mg by mouth every 2 (two) hours as needed for migraine. May repeat in 2 hours if headache persists or recurs. Patient not taking: Reported on 07/08/2020    [provider]  SYRINGE-NEEDLE, DISP, 3 ML (BD INTEGRA SYRINGE) 25G X 1" 3  ML MISC FOR USE WITH B12 INJECTIONS WEEKLY/MONTHLY 08/01/18   [name], MD  Vitamin D, Ergocalciferol, (DRISDOL) 1.25 MG (50000 UNIT) CAPS capsule TAKE 1 CAPSULE  BY MOUTH ONE TIME PER WEEK Patient taking differently: Take 50,000 Units by mouth every 7 (seven) days.  04/12/20   [name], MD    Allergies Amoxicillin, Avocado, Clarithromycin, Honey, Other, Sulfa drugs cross reactors, Watermelon concentrate, Adhesive [tape], Levemir [insulin detemir], and Victoza [liraglutide]  Family History  Problem Relation Age of Onset  . Diabetes Mother   . Hypertension Mother   . Hypertension Father   . Diabetes Father   . Depression Other        strong hx of  . Breast cancer Paternal Grandmother 20  . Breast cancer Maternal Grandmother   . Cancer Neg Hx     Social History Social History   Tobacco Use  . Smoking status: Former Smoker    Packs/day: 1.00    Years: 4.00    Pack years: 4.00    Types: Cigarettes    Quit date: 02/05/1996    Years since quitting: 24.4  . Smokeless tobacco: Never Used  Vaping Use  . Vaping Use: Never used  Substance Use Topics  . Alcohol use: No  . Drug use: No    Review of Systems  Constitutional: No fever/chills Eyes: No visual changes. ENT: No sore throat. Cardiovascular: Denies chest pain. Respiratory:  shortness of breath. Gastrointestinal: No abdominal pain.  No nausea, no vomiting.  No diarrhea.  No constipation. Genitourinary: Negative for dysuria. Musculoskeletal: Negative for back pain. Skin: Negative for rash. Neurological: Negative for headaches, focal weakness  ____________________________________________   PHYSICAL EXAM:  VITAL SIGNS: ED Triage Vitals  Enc Vitals Group     BP 07/22/20 1828 110/79     Pulse Rate 07/22/20 1828 87     Resp 07/22/20 1828 16     Temp --      Temp src --      SpO2 07/22/20 1603 92 %     Weight 07/22/20 1602 240 lb (108.9 kg)     Height 07/22/20 1602 _0  (1.626 m)     Head Circumference  --      Peak Flow --      Pain Score 07/22/20 1602 7     Pain Loc --      Pain Edu? --      Excl. in [place]? --     Constitutional: Alert and oriented. Well appearing and in no acute distress.  She does get short of breath when she moves around though. Eyes: Conjunctivae are normal.  Head: Atraumatic. Nose: No congestion/rhinnorhea. Mouth/Throat: Mucous membranes are moist.  Oropharynx non-erythematous. Neck: No stridor.   Cardiovascular: Normal rate, regular rhythm. Grossly normal heart sounds.  Good peripheral circulation. Respiratory: Normal respiratory effort.  But when she moves she has increased respiratory effort and some slight retractions.  Otherwise no retractions. Lungs CTAB. Gastrointestinal: Soft and nontender. No distention. No abdominal bruits. No CVA tenderness. Musculoskeletal: No lower extremity tenderness some slight edema.   Neurologic:  Normal speech and language. No gross focal neurologic deficits are appreciated. No gait instability. Skin:  Skin is warm, dry and intact. No rash noted.   ____________________________________________   LABS (all labs ordered are listed, but only abnormal results are displayed)  Labs Reviewed  CBC WITH DIFFERENTIAL/PLATELET - Abnormal; Notable for the following components:      Result Value   WBC 10.6 (*)    Hemoglobin 11.4 (*)    MCHC 29.8 (*)    RDW 16.4 (*)    Platelets 404 (*)    All other components within  normal limits  COMPREHENSIVE METABOLIC PANEL - Abnormal; Notable for the following components:   Glucose, Bld 136 (*)    Creatinine, Ser 1.20 (*)    Calcium 8.1 (*)    Total Protein 6.1 (*)    Albumin 2.7 (*)    GFR calc non Af Amer 53 (*)    All other components within normal limits  BRAIN NATRIURETIC PEPTIDE - Abnormal; Notable for the following components:   B Natriuretic Peptide 255.8 (*)    All other components within normal limits  SARS CORONAVIRUS 2 BY RT PCR (HOSPITAL ORDER, [facility])  TROPONIN I (HIGH SENSITIVITY)  TROPONIN I (HIGH SENSITIVITY)   ____________________________________________  EKG  EKG read interpreted by me shows normal sinus rhythm rate of 85 decreased R wave progression there are some flattening of the T waves in V2 and 3 since June of this year and the decreased R wave progression is also new since June of this year. ____________________________________________  [place]  ED MD interpretation: Chest x-ray read by radiology reviewed by me shows chronic elevation of the right hemidiaphragm and some atelectasis.  Official radiology report(s): DG Chest 2 View  Result Date: 07/22/2020 CLINICAL DATA:  Shortness of breath, shoulder surgery 3 days prior, history of COVID-19 EXAM: CHEST - 2 VIEW COMPARISON:  Radiograph 10/24/2019 FINDINGS: Chronic elevation of the right hemidiaphragm, likely accentuated by overall low lung volumes/atelectasis including more bandlike atelectasis in the right lung base. No focal consolidative opacity. No pneumothorax or visible effusion. The aorta is calcified. The remaining cardiomediastinal contours are unremarkable. No acute osseous or soft tissue abnormality. IMPRESSION: Chronic elevation of the right hemidiaphragm, likely accentuated by overall low lung volumes/atelectasis including subsegmental right basilar atelectasis. Electronically Signed   By: Lovena Le M.D.   On: 07/22/2020 16:52   CT Angio Chest PE W and/or Wo Contrast  Result Date: 07/22/2020 CLINICAL DATA:  Shortness of breath.  Recent shoulder surgery EXAM: CT ANGIOGRAPHY CHEST WITH CONTRAST TECHNIQUE: Multidetector CT imaging of the chest was performed using the standard protocol during bolus administration of intravenous contrast. Multiplanar CT image reconstructions and MIPs were obtained to evaluate the vascular anatomy. CONTRAST:  72m OMNIPAQUE IOHEXOL 350 MG/ML SOLN COMPARISON:  None. FINDINGS: Cardiovascular: No filling defects in the  pulmonary arteries to suggest pulmonary emboli. Heart is normal size. Aorta is normal caliber. Mediastinum/Nodes: No mediastinal, hilar, or axillary adenopathy. Lungs/Pleura: Chronic elevation of the right hemidiaphragm. There is medial right lower lobe atelectasis. Ground-glass opacity noted peripherally in the left upper lobe, rounded measuring 19 mm. No effusions. Upper Abdomen: Diffuse fatty infiltration of the liver. Prior cholecystectomy and gastric bypass. Musculoskeletal: Chest wall soft tissues are unremarkable. No acute bony abnormality. Review of the MIP images confirms the above findings. IMPRESSION: No evidence of pulmonary embolus. Chronic elevation of the right hemidiaphragm, stable dating back to prior chest x-ray from 10/24/2019. Right posteromedial lower lobe atelectasis. 19 mm rounded ground-glass opacity peripherally in the left upper lobe. This could be infectious or inflammatory, but follow-up chest CT in 6 months is recommended to assess for resolution. Electronically Signed   By: KRolm BaptiseM.D.   On: 07/22/2020 22:36    ____________________________________________   PROCEDURES  Procedure(s) performed (including Critical Care):  Procedures   ____________________________________________   INITIAL IMPRESSION / ASSESSMENT AND PLAN / ED COURSE  Patient's BNP is elevated.  Patient desaturates when she walks.  She reports she was not short of breath before the surgery.  CT  shows an almost 2 cm rounded groundglass opacity in the left upper lobe.  This could be infectious or inflammatory.  Possible she is developing a pneumonia.  I will consult with the hospitalist for possible admission.              ____________________________________________   FINAL CLINICAL IMPRESSION(S) / ED DIAGNOSES  Final diagnoses:  Hypoxia  Dyspnea, unspecified type     ED Discharge Orders    None       Note:  This document was prepared using Dragon voice recognition software  and may include unintentional dictation errors.    Nena Polio, MD 07/22/20 [phone]

## 2020-07-22 NOTE — Telephone Encounter (Signed)
Patient's husband called for his wife. Patient is concerned about her oxygen levels. She was at PT and level was 85 and then went up to 90 and is stable. Please call husband.

## 2020-07-23 DIAGNOSIS — I5033 Acute on chronic diastolic (congestive) heart failure: Secondary | ICD-10-CM | POA: Diagnosis not present

## 2020-07-23 DIAGNOSIS — J9601 Acute respiratory failure with hypoxia: Secondary | ICD-10-CM

## 2020-07-23 DIAGNOSIS — J690 Pneumonitis due to inhalation of food and vomit: Secondary | ICD-10-CM | POA: Diagnosis not present

## 2020-07-23 DIAGNOSIS — E785 Hyperlipidemia, unspecified: Secondary | ICD-10-CM | POA: Diagnosis not present

## 2020-07-23 DIAGNOSIS — R0902 Hypoxemia: Secondary | ICD-10-CM | POA: Diagnosis present

## 2020-07-23 LAB — GLUCOSE, CAPILLARY
Glucose-Capillary: 166 mg/dL — ABNORMAL HIGH (ref 70–99)
Glucose-Capillary: 41 mg/dL — CL (ref 70–99)
Glucose-Capillary: 84 mg/dL (ref 70–99)
Glucose-Capillary: 88 mg/dL (ref 70–99)

## 2020-07-23 LAB — TROPONIN I (HIGH SENSITIVITY): Troponin I (High Sensitivity): 7 ng/L (ref <18)

## 2020-07-23 LAB — STREP PNEUMONIAE URINARY ANTIGEN: Strep Pneumo Urinary Antigen: NEGATIVE

## 2020-07-23 MED ORDER — FLUOCINONIDE 0.05 % EX CREA: TOPICAL_CREAM | Freq: Two times a day (BID) | CUTANEOUS | Status: DC

## 2020-07-23 MED ORDER — PROMETHAZINE HCL 25 MG PO TABS: 25.0000 mg | ORAL_TABLET | Freq: Four times a day (QID) | ORAL | Status: DC | PRN

## 2020-07-23 MED ORDER — LEVOFLOXACIN 500 MG PO TABS
500.0000 mg | ORAL_TABLET | Freq: Every day | ORAL | 0 refills | Status: AC
Start: 2020-07-23 — End: 2020-07-28

## 2020-07-23 MED ORDER — BUPROPION HCL ER (XL) 150 MG PO TB24
450.0000 mg | ORAL_TABLET | Freq: Every day | ORAL | Status: DC
  Administered 2020-07-23: 450 mg via ORAL
  Filled 2020-07-23: qty 3

## 2020-07-23 MED ORDER — ASPIRIN EC 325 MG PO TBEC
325.0000 mg | DELAYED_RELEASE_TABLET | Freq: Every day | ORAL | Status: DC
  Administered 2020-07-23: 325 mg via ORAL
  Filled 2020-07-23: qty 1

## 2020-07-23 MED ORDER — MELATONIN 5 MG PO TABS
10.0000 mg | ORAL_TABLET | Freq: Every evening | ORAL | Status: DC | PRN
  Filled 2020-07-23: qty 2

## 2020-07-23 MED ORDER — ONDANSETRON 4 MG PO TBDP: 4.0000 mg | ORAL_TABLET | Freq: Three times a day (TID) | ORAL | Status: DC | PRN

## 2020-07-23 MED ORDER — ENOXAPARIN SODIUM 40 MG/0.4ML ~~LOC~~ SOLN
40.0000 mg | Freq: Two times a day (BID) | SUBCUTANEOUS | Status: DC
  Administered 2020-07-23 (×2): 40 mg via SUBCUTANEOUS
  Filled 2020-07-23 (×2): qty 0.4

## 2020-07-23 MED ORDER — SODIUM CHLORIDE 0.9 % IV SOLN: 250.0000 mL | INTRAVENOUS | Status: DC | PRN

## 2020-07-23 MED ORDER — CYCLOBENZAPRINE HCL 10 MG PO TABS: 5.0000 mg | ORAL_TABLET | Freq: Three times a day (TID) | ORAL | Status: DC | PRN

## 2020-07-23 MED ORDER — OXYCODONE HCL 5 MG PO TABS: 5.0000 mg | ORAL_TABLET | ORAL | Status: DC | PRN

## 2020-07-23 MED ORDER — ZOLPIDEM TARTRATE 5 MG PO TABS: 5.0000 mg | ORAL_TABLET | Freq: Every evening | ORAL | Status: DC | PRN

## 2020-07-23 MED ORDER — METOPROLOL TARTRATE 25 MG PO TABS
25.0000 mg | ORAL_TABLET | Freq: Two times a day (BID) | ORAL | Status: DC
  Administered 2020-07-23 (×2): 25 mg via ORAL
  Filled 2020-07-23 (×2): qty 1

## 2020-07-23 MED ORDER — INSULIN ASPART 100 UNIT/ML ~~LOC~~ SOLN
0.0000 [IU] | Freq: Three times a day (TID) | SUBCUTANEOUS | Status: DC
  Administered 2020-07-23: 2 [IU] via SUBCUTANEOUS
  Filled 2020-07-23: qty 1

## 2020-07-23 MED ORDER — MECLIZINE HCL 25 MG PO TABS: 25.0000 mg | ORAL_TABLET | Freq: Three times a day (TID) | ORAL | Status: DC | PRN

## 2020-07-23 MED ORDER — ASPIRIN-ACETAMINOPHEN-CAFFEINE 250-250-65 MG PO TABS: 2.0000 | ORAL_TABLET | Freq: Four times a day (QID) | ORAL | Status: DC | PRN

## 2020-07-23 MED ORDER — SODIUM CHLORIDE 0.9% FLUSH: 3.0000 mL | Freq: Two times a day (BID) | INTRAVENOUS | Status: DC

## 2020-07-23 MED ORDER — AZELASTINE HCL 0.1 % NA SOLN
2.0000 | Freq: Every day | NASAL | Status: DC | PRN
  Filled 2020-07-23: qty 30

## 2020-07-23 MED ORDER — LORAZEPAM 0.5 MG PO TABS: 0.5000 mg | ORAL_TABLET | Freq: Every day | ORAL | Status: DC | PRN

## 2020-07-23 MED ORDER — VITAMIN D (ERGOCALCIFEROL) 1.25 MG (50000 UNIT) PO CAPS: 50000.0000 [IU] | ORAL_CAPSULE | ORAL | Status: DC

## 2020-07-23 MED ORDER — INSULIN ASPART 100 UNIT/ML ~~LOC~~ SOLN: 0.0000 [IU] | Freq: Every day | SUBCUTANEOUS | Status: DC

## 2020-07-23 MED ORDER — NYSTATIN 100000 UNIT/GM EX POWD
1.0000 "application " | Freq: Every day | CUTANEOUS | Status: DC | PRN
  Filled 2020-07-23: qty 15

## 2020-07-23 MED ORDER — CYANOCOBALAMIN 1000 MCG/ML IJ SOLN: 1000.0000 ug | INTRAMUSCULAR | Status: DC

## 2020-07-23 MED ORDER — LEVOTHYROXINE SODIUM 50 MCG PO TABS
50.0000 ug | ORAL_TABLET | Freq: Every day | ORAL | Status: DC
  Administered 2020-07-23: 50 ug via ORAL
  Filled 2020-07-23: qty 1

## 2020-07-23 MED ORDER — FUROSEMIDE 40 MG PO TABS: 20.0000 mg | ORAL_TABLET | Freq: Every day | ORAL | Status: DC | PRN

## 2020-07-23 MED ORDER — INSULIN GLARGINE 100 UNIT/ML ~~LOC~~ SOLN
33.0000 [IU] | Freq: Every day | SUBCUTANEOUS | Status: DC
  Filled 2020-07-23: qty 0.33

## 2020-07-23 MED ORDER — EPINEPHRINE 0.3 MG/0.3ML IJ SOAJ: 0.3000 mg | INTRAMUSCULAR | Status: DC | PRN

## 2020-07-23 MED ORDER — ESCITALOPRAM OXALATE 10 MG PO TABS
20.0000 mg | ORAL_TABLET | Freq: Every day | ORAL | Status: DC
  Administered 2020-07-23: 20 mg via ORAL
  Filled 2020-07-23: qty 2

## 2020-07-23 MED ORDER — FUROSEMIDE 10 MG/ML IJ SOLN
40.0000 mg | Freq: Every day | INTRAMUSCULAR | Status: DC
  Administered 2020-07-23: 40 mg via INTRAVENOUS
  Filled 2020-07-23 (×2): qty 4

## 2020-07-23 MED ORDER — FLUTICASONE PROPIONATE 50 MCG/ACT NA SUSP
2.0000 | Freq: Every day | NASAL | Status: DC
  Administered 2020-07-23: 2 via NASAL
  Filled 2020-07-23: qty 16

## 2020-07-23 MED ORDER — DIPHENHYDRAMINE HCL 25 MG PO CAPS: 25.0000 mg | ORAL_CAPSULE | Freq: Four times a day (QID) | ORAL | Status: DC | PRN

## 2020-07-23 MED ORDER — ALPRAZOLAM 0.25 MG PO TABS: 0.2500 mg | ORAL_TABLET | Freq: Two times a day (BID) | ORAL | Status: DC | PRN

## 2020-07-23 MED ORDER — ONDANSETRON HCL 4 MG/2ML IJ SOLN: 4.0000 mg | Freq: Four times a day (QID) | INTRAMUSCULAR | Status: DC | PRN

## 2020-07-23 MED ORDER — LAMOTRIGINE 25 MG PO TABS
150.0000 mg | ORAL_TABLET | Freq: Every day | ORAL | Status: DC
  Administered 2020-07-23: 150 mg via ORAL
  Filled 2020-07-23: qty 1

## 2020-07-23 MED ORDER — SODIUM CHLORIDE 0.9% FLUSH: 3.0000 mL | INTRAVENOUS | Status: DC | PRN

## 2020-07-23 MED ORDER — ATORVASTATIN CALCIUM 20 MG PO TABS: 20.0000 mg | ORAL_TABLET | Freq: Every day | ORAL | Status: DC

## 2020-07-23 MED ORDER — SODIUM FLUORIDE 1.1 % DT PSTE: 1.0000 "application " | PASTE | Freq: Every day | DENTAL | Status: DC

## 2020-07-23 MED ORDER — LEVOFLOXACIN IN D5W 750 MG/150ML IV SOLN
750.0000 mg | INTRAVENOUS | Status: DC
  Administered 2020-07-23: 750 mg via INTRAVENOUS
  Filled 2020-07-23: qty 150

## 2020-07-23 MED ORDER — LOPERAMIDE HCL 2 MG PO CAPS: 4.0000 mg | ORAL_CAPSULE | ORAL | Status: DC | PRN

## 2020-07-23 MED ORDER — ALBUTEROL SULFATE (2.5 MG/3ML) 0.083% IN NEBU: 2.5000 mg | INHALATION_SOLUTION | Freq: Four times a day (QID) | RESPIRATORY_TRACT | Status: DC | PRN

## 2020-07-23 MED ORDER — ACETAMINOPHEN 325 MG PO TABS
650.0000 mg | ORAL_TABLET | ORAL | Status: DC | PRN
  Administered 2020-07-23 (×2): 650 mg via ORAL
  Filled 2020-07-23 (×2): qty 2

## 2020-07-23 MED ORDER — GABAPENTIN 600 MG PO TABS
600.0000 mg | ORAL_TABLET | Freq: Two times a day (BID) | ORAL | Status: DC
  Administered 2020-07-23 (×2): 600 mg via ORAL
  Filled 2020-07-23 (×2): qty 1

## 2020-07-23 MED ORDER — INSULIN ASPART 100 UNIT/ML ~~LOC~~ SOLN: 5.0000 [IU] | Freq: Three times a day (TID) | SUBCUTANEOUS | Status: DC

## 2020-07-23 MED ORDER — TRIAMCINOLONE ACETONIDE 0.025 % EX CREA
TOPICAL_CREAM | Freq: Two times a day (BID) | CUTANEOUS | Status: DC
  Filled 2020-07-23: qty 15

## 2020-07-23 NOTE — Progress Notes (Signed)
Pharmacy Antibiotic Note  Maria Bentley is a 49 y.o. female admitted on 07/22/2020 with pneumonia.  Pharmacy has been consulted for levaquin dosing.  Plan: Will start levaquin 750 mg IV q24h per CrCl > 50 ml/min and will continue to monitor and adjust per changes in renal function.  QTc 452 WNL  Height: 5\' 4"  (162.6 cm) Weight: 108.9 kg (240 lb) IBW/kg (Calculated) : 54.7  Temp (24hrs), Avg:98 F (36.7 C), Min:98 F (36.7 C), Max:98 F (36.7 C)  Recent Labs  Lab 07/22/20 1613  WBC 10.6*  CREATININE 1.20*    Estimated Creatinine Clearance: 67.6 mL/min (A) (by C-G formula based on SCr of 1.2 mg/dL (H)).    Allergies  Allergen Reactions  . Amoxicillin Hives and Rash  . Avocado Swelling  . Clarithromycin Hives and Rash  . Honey Anaphylaxis  . Other Hives and Swelling    All melon  . Sulfa Drugs Cross Reactors Anaphylaxis  . Watermelon Concentrate Hives and Swelling    Oral swelling, ears swell shut  . Adhesive [Tape] Itching    Skin breakdown  . Levemir [Insulin Detemir] Rash    Red/swollen injection site reactions with residual lumps  . Victoza [Liraglutide] Nausea Only    Thank you for allowing pharmacy to be a part of this patient's care.  Tobie Lords, PharmD, BCPS Clinical Pharmacist 07/23/2020 2:41 AM

## 2020-07-23 NOTE — ED Notes (Addendum)
This RN messaged admitting MD in regards to pt maintaining oxygen saturation. Pt ambulated to bathroom without oxygen on and sats stayed around 92-94%. This RN decreased [place] to 2L and pt has been maintaining at 97-98%.

## 2020-07-23 NOTE — ED Notes (Signed)
Pt moved to room 32 and placed on monitoring equipment/ 3L [place] still at this time. Pt given ice water and lights dimmed for comfort. Denies further needs at this time. Pt made aware of RN hourly rounding and understands.

## 2020-07-23 NOTE — ED Notes (Signed)
Pt ambulatory with steady gait to bathroom with assistance by this RN.

## 2020-07-23 NOTE — Discharge Summary (Signed)
Physician Discharge Summary  Maria Bentley GNF:[id number] DOB: 09-02-1971 DOA: 07/22/2020  PCP: [name], MD  Admit date: 07/22/2020 Discharge date: 07/23/2020  Admitted From: Home Disposition: Home  Recommendations for Outpatient Follow-up:  1. Follow up with PCP in 1-2 weeks 2. Follow-up with orthopedic surgery as scheduled.   Discharge Condition: Stable CODE STATUS: Full code Diet recommendation: Low-carb diet.  Discharge summary: 49 year old female with history of diabetes, hypertension, COVID-19 pneumonia on November 2020 treated with mechanical ventilation with subsequent improvement, depression anxiety and morbid obesity presented to the emergency room with shortness of breath and exertional dyspnea associated with dry cough that started after she had right rotator cuff tear repair on 8/9.  She was sedated and she had also received a scalene block.  At the emergency room, hemodynamically stable.  92% on room air and it will drop to 88% on ambulation.  Lab test were normal.  COVID-19 was negative.  Chest x-ray shows elevated right hemidiaphragm and some atelectasis.  CTA of the chest with no evidence of PE, it showed chronic elevation of right hemidiaphragm that was present on imaging studies from last year, it also showed 19 mm rounded groundglass density left upper lobe with unknown significance.  Due to significant symptoms and hypoxia, she was started on Levaquin and was admitted for treatment.  Incidentally, she had missed her dinner last night and also found to be hypoglycemic.  Patient was evaluated in the emergency room waiting for bed assignment, her symptoms has improved.  Patient is mobilized in the hallway with no drop in saturations, her breathing has improved and she is using incentive spirometry.  Patient has adequate improvement today and is almost at her usual self.  Patient was able to maintain her saturations more than 94% on room air on walking in the  hallway.  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  19 mm groundglass opacity on the left upper lobe is of unknown significance, she has history of COVID-19 pneumonia.  Currently no evidence of bacterial infection.  With resolution of symptoms, will treat with 5 days of antibiotic therapy with Levaquin as patient is allergic to all other safer medications.  She is trained to use incentive spirometry at home.  Case also discussed with orthopedic surgery and they will follow up outpatient. A 2D echocardiogram was ordered, BNP was 255.  She has known diastolic dysfunction and on diuretics at home.  She will resume all her medications.  Patient had hypoglycemic episode in the morning, blood sugar went down to 41 after taking her long-acting insulin at night and missing dinner.  This was adequately replaced with improvement.  Patient has good appetite now.  She will be taking half dose of long-acting insulin tonight and monitor her blood sugars at home and take full dose only when blood sugars are well above 100.  Discharge Diagnoses:  Active Problems:   Hypoxia    Discharge Instructions  Discharge Instructions    Call MD for:  difficulty breathing, headache or visual disturbances   Complete by: As directed    Diet - low sodium heart healthy   Complete by: As directed    Diet Carb Modified   Complete by: As directed    Discharge instructions   Complete by: As directed  Take half the insulin doses tonight Use incentive spirometry at home   Increase activity slowly   Complete by: As directed      Allergies as of 07/23/2020      Reactions   Amoxicillin Hives, Rash   Avocado Swelling   Clarithromycin Hives, Rash   Honey Anaphylaxis   Other Hives,  Swelling   All melon   Sulfa Drugs Cross Reactors Anaphylaxis   Watermelon Concentrate Hives, Swelling   Oral swelling, ears swell shut   Adhesive [tape] Itching   Skin breakdown   Levemir [insulin Detemir] Rash   Red/swollen injection site reactions with residual lumps   Victoza [liraglutide] Nausea Only      Medication List    STOP taking these medications   diltiazem 300 MG 24 hr capsule Commonly known as: CARDIZEM CD   fluocinonide cream 0.05 % Commonly known as: LIDEX   metolazone 2.5 MG tablet Commonly known as: ZAROXOLYN     TAKE these medications   acetaminophen 500 MG tablet Commonly known as: TYLENOL Take 2 tablets (1,000 mg total) by mouth every 8 (eight) hours.   albuterol 108 (90 Base) MCG/ACT inhaler Commonly known as: VENTOLIN HFA Inhale 2 puffs into the lungs every 6 (six) hours as needed for wheezing or shortness of breath.   amitriptyline 10 MG tablet Commonly known as: ELAVIL TAKE 1 TABLET BY MOUTH EVERYDAY AT BEDTIME What changed: See the new instructions.   aspirin EC 325 MG tablet Take 1 tablet (325 mg total) by mouth daily for 14 days.   aspirin-acetaminophen-caffeine 250-250-65 MG tablet Commonly known as: EXCEDRIN MIGRAINE Take 2 tablets by mouth every 6 (six) hours as needed for headache.   atorvastatin 20 MG tablet Commonly known as: LIPITOR TAKE 1 TABLET BY MOUTH EVERY DAY   azelastine 0.1 % nasal spray Commonly known as: ASTELIN Place 2 sprays into both nostrils daily as needed for rhinitis or allergies.   Basaglar KwikPen 100 UNIT/ML Inject 0.5 mLs (50 Units total) into the skin daily. What changed:   how much to take  when to take this   blood glucose meter kit and supplies Kit Dispense based on patient and insurance preference. Use up to four times daily as directed. (FOR ICD-E11.21)   BOTOX IJ Inject as directed every 3 (three) months. For migraine   buPROPion 150 MG 24 hr tablet Commonly known as: WELLBUTRIN  XL Take 450 mg by mouth daily.   Clinpro 5000 1.1 % Pste Generic drug: Sodium Fluoride Place 1 application onto teeth at bedtime.   cyanocobalamin 1000 MCG/ML injection Commonly known as: (VITAMIN B-12) INJECT 1 ML (1,000 MCG TOTAL) INTO THE MUSCLE ONCE A WEEK. FOR 4 WEEKS, THEN MONTHLY THEREAFTER   cyclobenzaprine 10 MG tablet Commonly known as: FLEXERIL Take 5-10 mg by mouth 3 (three) times daily as needed for muscle spasms.   diphenhydrAMINE 25 MG tablet Commonly known as: BENADRYL Take 25 mg by mouth every 6 (six) hours as needed for allergies.   EPINEPHrine 0.3 mg/0.3 mL Soaj injection Commonly known as: EPI-PEN Inject 0.3 mg into the muscle as needed for anaphylaxis.   escitalopram 20 MG tablet Commonly known as: LEXAPRO Take 20 mg by mouth daily.   fluticasone 50 MCG/ACT nasal spray Commonly known as: FLONASE SPRAY 2 SPRAYS INTO EACH NOSTRIL EVERY DAY What changed: See the new instructions.   FreeStyle Libre 14 Day Reader Kerrin Mo 1 applicator by Does not apply route 4 (four) times daily.   FreeStyle Libre 14 Day  Sensor Misc Inject 1 application into the skin 4 (four) times daily as needed (uncontrolled diabetes).   furosemide 20 MG tablet Commonly known as: LASIX Take 1 tablet (20 mg total) by mouth daily. As needed for fluid retention What changed:   when to take this  reasons to take this  additional instructions   Horizant 600 MG Tbcr Generic drug: Gabapentin Enacarbil Take 600 mg by mouth 2 (two) times daily.   Insulin Pen Needle 32G X 4 MM Misc Commonly known as: BD Pen Needle Nano U/F Please specify directions, refills and quantity   lamoTRIgine 150 MG tablet Commonly known as: LAMICTAL Take 150 mg by mouth daily.   Lancet Device Misc Test blood sugar two times daily   levofloxacin 500 MG tablet Commonly known as: Levaquin Take 1 tablet (500 mg total) by mouth daily for 5 days.   levothyroxine 50 MCG tablet Commonly known as:  SYNTHROID Take 1 tablet (50 mcg total) by mouth daily.   loperamide 2 MG capsule Commonly known as: IMODIUM Take 2 capsules (4 mg total) by mouth as needed for diarrhea or loose stools.   LORazepam 0.5 MG tablet Commonly known as: ATIVAN Take 0.5-1 mg by mouth daily as needed for anxiety.   meclizine 25 MG tablet Commonly known as: ANTIVERT Take 1 tablet (25 mg total) by mouth 3 (three) times daily as needed for dizziness.   Melatonin 10 MG Tabs Take 10 mg by mouth at bedtime as needed (sleep).   metoprolol tartrate 25 MG tablet Commonly known as: LOPRESSOR Take 1 tablet (25 mg total) by mouth 2 (two) times daily.   NovoLOG FlexPen 100 UNIT/ML FlexPen Generic drug: insulin aspart INJECT 20 UNITS INTO THE SKIN 3 TIMES DAILY WITH MEALS What changed: See the new instructions.   NURTEC PO Take by mouth as needed.   nystatin powder Commonly known as: MYCOSTATIN/NYSTOP APPLY 1 APPLICATION TO AFFECTED AREA TWICE A DAY What changed: See the new instructions.   ondansetron 4 MG disintegrating tablet Commonly known as: Zofran ODT Take 1 tablet (4 mg total) by mouth every 8 (eight) hours as needed for nausea or vomiting.   oxyCODONE 5 MG immediate release tablet Commonly known as: Roxicodone Take 1-2 tablets (5-10 mg total) by mouth every 4 (four) hours as needed (pain).   promethazine 25 MG tablet Commonly known as: PHENERGAN Take 25 mg by mouth every 6 (six) hours as needed for nausea or vomiting.   SUMAtriptan 100 MG tablet Commonly known as: IMITREX Take 100 mg by mouth every 2 (two) hours as needed for migraine. May repeat in 2 hours if headache persists or recurs.   SYRINGE-NEEDLE (DISP) 3 ML 25G X 1" 3 ML Misc Commonly known as: BD Integra Syringe FOR USE WITH B12 INJECTIONS WEEKLY/MONTHLY   Vitamin D (Ergocalciferol) 1.25 MG (50000 UNIT) Caps capsule Commonly known as: DRISDOL TAKE 1 CAPSULE BY MOUTH ONE TIME PER WEEK What changed: See the new instructions.        Allergies  Allergen Reactions  . Amoxicillin Hives and Rash  . Avocado Swelling  . Clarithromycin Hives and Rash  . Honey Anaphylaxis  . Other Hives and Swelling    All melon  . Sulfa Drugs Cross Reactors Anaphylaxis  . Watermelon Concentrate Hives and Swelling    Oral swelling, ears swell shut  . Adhesive [Tape] Itching    Skin breakdown  . Levemir [Insulin Detemir] Rash    Red/swollen injection site reactions with residual lumps  . Victoza [  Liraglutide] Nausea Only      Procedures/Studies: DG Chest 2 View  Result Date: 07/22/2020 CLINICAL DATA:  Shortness of breath, shoulder surgery 3 days prior, history of COVID-19 EXAM: CHEST - 2 VIEW COMPARISON:  Radiograph 10/24/2019 FINDINGS: Chronic elevation of the right hemidiaphragm, likely accentuated by overall low lung volumes/atelectasis including more bandlike atelectasis in the right lung base. No focal consolidative opacity. No pneumothorax or visible effusion. The aorta is calcified. The remaining cardiomediastinal contours are unremarkable. No acute osseous or soft tissue abnormality. IMPRESSION: Chronic elevation of the right hemidiaphragm, likely accentuated by overall low lung volumes/atelectasis including subsegmental right basilar atelectasis. Electronically Signed   By: Lovena Le M.D.   On: 07/22/2020 16:52   CT Angio Chest PE W and/or Wo Contrast  Result Date: 07/22/2020 CLINICAL DATA:  Shortness of breath.  Recent shoulder surgery EXAM: CT ANGIOGRAPHY CHEST WITH CONTRAST TECHNIQUE: Multidetector CT imaging of the chest was performed using the standard protocol during bolus administration of intravenous contrast. Multiplanar CT image reconstructions and MIPs were obtained to evaluate the vascular anatomy. CONTRAST:  44m OMNIPAQUE IOHEXOL 350 MG/ML SOLN COMPARISON:  None. FINDINGS: Cardiovascular: No filling defects in the pulmonary arteries to suggest pulmonary emboli. Heart is normal size. Aorta is normal caliber.  Mediastinum/Nodes: No mediastinal, hilar, or axillary adenopathy. Lungs/Pleura: Chronic elevation of the right hemidiaphragm. There is medial right lower lobe atelectasis. Ground-glass opacity noted peripherally in the left upper lobe, rounded measuring 19 mm. No effusions. Upper Abdomen: Diffuse fatty infiltration of the liver. Prior cholecystectomy and gastric bypass. Musculoskeletal: Chest wall soft tissues are unremarkable. No acute bony abnormality. Review of the MIP images confirms the above findings. IMPRESSION: No evidence of pulmonary embolus. Chronic elevation of the right hemidiaphragm, stable dating back to prior chest x-ray from 10/24/2019. Right posteromedial lower lobe atelectasis. 19 mm rounded ground-glass opacity peripherally in the left upper lobe. This could be infectious or inflammatory, but follow-up chest CT in 6 months is recommended to assess for resolution. Electronically Signed   By: KRolm BaptiseM.D.   On: 07/22/2020 22:36   CT ABDOMEN PELVIS W CONTRAST  Result Date: 06/29/2020 CLINICAL DATA:  Abdominal distension. Recurrent diarrhea. History of gastric bypass. EXAM: CT ABDOMEN AND PELVIS WITH CONTRAST TECHNIQUE: Multidetector CT imaging of the abdomen and pelvis was performed using the standard protocol following bolus administration of intravenous contrast. CONTRAST:  81mOMNIPAQUE IOHEXOL 300 MG/ML  SOLN COMPARISON:  03/29/2007 FINDINGS: Lower chest: Calcifications in the visualized left anterior descending coronary artery. Heart is normal size. No acute findings Hepatobiliary: Prior cholecystectomy. Diffuse fatty infiltration of the liver. No focal hepatic abnormality. Pancreas: Diffuse atrophy and fatty replacement. No focal abnormality or ductal dilatation. Spleen: No focal abnormality.  Normal size. Adrenals/Urinary Tract: There is an area of ill-defined low-density anteriorly within the midpole of the left kidney. This persists on the delayed images. Slight perinephric  stranding noted in this area. This is difficult to measure due to its indistinct nature but measures approximately 3 cm. Adrenal glands and right kidney unremarkable. No hydronephrosis bilaterally. Urinary bladder unremarkable. Stomach/Bowel: Prior gastric bypass. No complicating feature. There is dilatation of small bowel loop in the left abdomen which appears to be at the level of distal anastomosis, likely related to postoperative denervation. Remainder of large and small bowel unremarkable. Vascular/Lymphatic: Scattered aortic atherosclerosis. No evidence of aneurysm or adenopathy. Reproductive: Uterus and adnexa unremarkable.  No mass. Other: No free fluid or free air. Musculoskeletal: No acute bony abnormality. IMPRESSION: Postoperative changes  from prior gastric bypass. No complicating feature. Area of ill-defined low-density anteriorly in the left kidney in the midpole region. This could reflect an area of pyelonephritis although mass/neoplasm cannot be excluded. Consider further evaluation with MRI if the patient is asymptomatic and pyelonephritis is not clinically suspected. Diffuse fatty infiltration of the liver. Coronary artery disease, aortic atherosclerosis. These results will be called to the ordering clinician or representative by the Radiologist Assistant, and communication documented in the PACS or [employer]. Electronically Signed   By: Rolm Baptise M.D.   On: 06/29/2020 13:18   [place] OR NERVE BLOCK-IMAGE ONLY [facility])  Result Date: 07/19/2020 There is no interpretation for this exam.  This order is for images obtained during a surgical procedure.  Please See "Surgeries" Tab for more information regarding the procedure.   (Echo, Carotid, EGD, Colonoscopy, ERCP)    Subjective: Patient seen and examined.  Early morning she was trying to ambulate and was feeling better. Patient reevaluated in the afternoon for discharge readiness.  She is on room air and walking around.  Does not feel  short of breath on ambulation.  Eager to go home.   Discharge Exam: Vitals:   07/23/20 1230 07/23/20 1400  BP: 120/76 140/67  Pulse: 72 91  Resp: 15 15  Temp:    SpO2: 100% 95%   Vitals:   07/23/20 0900 07/23/20 1030 07/23/20 1230 07/23/20 1400  BP: 114/64 121/79 120/76 140/67  Pulse: 99 82 72 91  Resp: _0 Temp:      TempSrc:      SpO2: 100% 100% 100% 95%  Weight:      Height:        General: Pt is alert, awake, not in acute distress Cardiovascular: RRR, S1/S2 +, no rubs, no gallops Respiratory: CTA bilaterally, no wheezing, no rhonchi Abdominal: Soft, NT, ND, bowel sounds + Extremities: no edema, no cyanosis Right shoulder on immobilizer, postop dressing not removed by me.    The results of significant diagnostics from this hospitalization (including imaging, microbiology, ancillary and laboratory) are listed below for reference.     Microbiology: Recent Results (from the past 240 hour(s))  SARS CORONAVIRUS 2 (TAT 6-24 HRS) Nasopharyngeal Nasopharyngeal Swab     Status: None   Collection Time: 07/15/20 10:33 AM   Specimen: Nasopharyngeal Swab  Result Value Ref Range Status   SARS Coronavirus 2 NEGATIVE NEGATIVE Final    Comment: (NOTE) SARS-CoV-2 target nucleic acids are NOT DETECTED.  The SARS-CoV-2 RNA is generally detectable in upper and lower respiratory specimens during the acute phase of infection. Negative results do not preclude SARS-CoV-2 infection, do not rule out co-infections with other pathogens, and should not be used as the sole basis for treatment or other patient management decisions. Negative results must be combined with clinical observations, patient history, and epidemiological information. The expected result is Negative.  Fact Sheet for Patients: SugarRoll.be  Fact Sheet for Healthcare Providers: https://www.woods-mathews.com/  This test is not yet approved or cleared by the [place] FDA and  has been authorized for detection and/or diagnosis of SARS-CoV-2 by FDA under an Emergency Use Authorization (EUA). This EUA will remain  in effect (meaning this test can be used) for the duration of the COVID-19 declaration under Se ction 564(b)(1) of the Act, 21 U.S.C. section 360bbb-3(b)(1), unless the authorization is terminated or revoked sooner.  Performed at [facility], [place] [address], [place], [place] [address]   SARS Coronavirus 2 by  RT PCR (hospital order, performed in [facility] hospital lab) Nasopharyngeal Nasopharyngeal Swab     Status: None   Collection Time: 07/22/20 10:05 PM   Specimen: Nasopharyngeal Swab  Result Value Ref Range Status   SARS Coronavirus 2 NEGATIVE NEGATIVE Final    Comment: (NOTE) SARS-CoV-2 target nucleic acids are NOT DETECTED.  The SARS-CoV-2 RNA is generally detectable in upper and lower respiratory specimens during the acute phase of infection. The lowest concentration of SARS-CoV-2 viral copies this assay can detect is 250 copies / mL. A negative result does not preclude SARS-CoV-2 infection and should not be used as the sole basis for treatment or other patient management decisions.  A negative result may occur with improper specimen collection / handling, submission of specimen other than nasopharyngeal swab, presence of viral mutation(s) within the areas targeted by this assay, and inadequate number of viral copies (<250 copies / mL). A negative result must be combined with clinical observations, patient history, and epidemiological information.  Fact Sheet for Patients:   StrictlyIdeas.no  Fact Sheet for Healthcare Providers: BankingDealers.co.za  This test is not yet approved or  cleared by the [place] FDA and has been authorized for detection and/or diagnosis of SARS-CoV-2 by FDA under an Emergency Use Authorization (EUA).  This EUA will remain in effect  (meaning this test can be used) for the duration of the COVID-19 declaration under Section 564(b)(1) of the Act, 21 U.S.C. section 360bbb-3(b)(1), unless the authorization is terminated or revoked sooner.  Performed at [facility], [address] [place], [place] [address]      Labs: BNP (last 3 results) Recent Labs    07/22/20 2205  BNP 616.0*   Basic Metabolic Panel: Recent Labs  Lab 07/22/20 1613  NA 136  K 4.5  CL 102  CO2 24  GLUCOSE 136*  BUN 20  CREATININE 1.20*  CALCIUM 8.1*   Liver Function Tests: Recent Labs  Lab 07/22/20 1613  AST 20  ALT 27  ALKPHOS 123  BILITOT 0.6  PROT 6.1*  ALBUMIN 2.7*   No results for input(s): LIPASE, AMYLASE in the last 168 hours. No results for input(s): AMMONIA in the last 168 hours. CBC: Recent Labs  Lab 07/22/20 1613  WBC 10.6*  NEUTROABS 6.4  HGB 11.4*  HCT 38.3  MCV 98.0  PLT 404*   Cardiac Enzymes: No results for input(s): CKTOTAL, CKMB, CKMBINDEX, TROPONINI in the last 168 hours. BNP: Invalid input(s): POCBNP CBG: Recent Labs  Lab 07/23/20 0000 07/23/20 0001 07/23/20 0117 07/23/20 0745 07/23/20 1145  GLUCAP 39* 41* 84 88 166*   D-Dimer No results for input(s): DDIMER in the last 72 hours. Hgb A1c No results for input(s): HGBA1C in the last 72 hours. Lipid Profile No results for input(s): CHOL, HDL, LDLCALC, TRIG, CHOLHDL, LDLDIRECT in the last 72 hours. Thyroid function studies No results for input(s): TSH, T4TOTAL, T3FREE, THYROIDAB in the last 72 hours.  Invalid input(s): FREET3 Anemia work up No results for input(s): VITAMINB12, FOLATE, FERRITIN, TIBC, IRON, RETICCTPCT in the last 72 hours. Urinalysis    Component Value Date/Time   COLORURINE YELLOW 07/02/2020 0946   APPEARANCEUR CLEAR 07/02/2020 0946   LABSPEC 1.015 07/02/2020 0946   PHURINE 5.5 07/02/2020 0946   GLUCOSEU NEGATIVE 07/02/2020 0946   HGBUR MODERATE (A) 07/02/2020 0946   BILIRUBINUR NEGATIVE 07/02/2020  0946   BILIRUBINUR negative 06/15/2020 1607   KETONESUR NEGATIVE 07/02/2020 0946   PROTEINUR Negative 06/15/2020 1607   PROTEINUR NEGATIVE 05/17/2020 1602  UROBILINOGEN 0.2 07/02/2020 0946   NITRITE NEGATIVE 07/02/2020 0946   LEUKOCYTESUR NEGATIVE 07/02/2020 0946   Sepsis Labs Invalid input(s): PROCALCITONIN,  WBC,  LACTICIDVEN Microbiology Recent Results (from the past 240 hour(s))  SARS CORONAVIRUS 2 (TAT 6-24 HRS) Nasopharyngeal Nasopharyngeal Swab     Status: None   Collection Time: 07/15/20 10:33 AM   Specimen: Nasopharyngeal Swab  Result Value Ref Range Status   SARS Coronavirus 2 NEGATIVE NEGATIVE Final    Comment: (NOTE) SARS-CoV-2 target nucleic acids are NOT DETECTED.  The SARS-CoV-2 RNA is generally detectable in upper and lower respiratory specimens during the acute phase of infection. Negative results do not preclude SARS-CoV-2 infection, do not rule out co-infections with other pathogens, and should not be used as the sole basis for treatment or other patient management decisions. Negative results must be combined with clinical observations, patient history, and epidemiological information. The expected result is Negative.  Fact Sheet for Patients: SugarRoll.be  Fact Sheet for Healthcare Providers: https://www.woods-mathews.com/  This test is not yet approved or cleared by the [place] FDA and  has been authorized for detection and/or diagnosis of SARS-CoV-2 by FDA under an Emergency Use Authorization (EUA). This EUA will remain  in effect (meaning this test can be used) for the duration of the COVID-19 declaration under Se ction 564(b)(1) of the Act, 21 U.S.C. section 360bbb-3(b)(1), unless the authorization is terminated or revoked sooner.  Performed at [facility], [place] [address], [place], [place] [address]   SARS Coronavirus 2 by RT PCR (hospital order, performed in [facility] hospital lab)  Nasopharyngeal Nasopharyngeal Swab     Status: None   Collection Time: 07/22/20 10:05 PM   Specimen: Nasopharyngeal Swab  Result Value Ref Range Status   SARS Coronavirus 2 NEGATIVE NEGATIVE Final    Comment: (NOTE) SARS-CoV-2 target nucleic acids are NOT DETECTED.  The SARS-CoV-2 RNA is generally detectable in upper and lower respiratory specimens during the acute phase of infection. The lowest concentration of SARS-CoV-2 viral copies this assay can detect is 250 copies / mL. A negative result does not preclude SARS-CoV-2 infection and should not be used as the sole basis for treatment or other patient management decisions.  A negative result may occur with improper specimen collection / handling, submission of specimen other than nasopharyngeal swab, presence of viral mutation(s) within the areas targeted by this assay, and inadequate number of viral copies (<250 copies / mL). A negative result must be combined with clinical observations, patient history, and epidemiological information.  Fact Sheet for Patients:   StrictlyIdeas.no  Fact Sheet for Healthcare Providers: BankingDealers.co.za  This test is not yet approved or  cleared by the [place] FDA and has been authorized for detection and/or diagnosis of SARS-CoV-2 by FDA under an Emergency Use Authorization (EUA).  This EUA will remain in effect (meaning this test can be used) for the duration of the COVID-19 declaration under Section 564(b)(1) of the Act, 21 U.S.C. section 360bbb-3(b)(1), unless the authorization is terminated or revoked sooner.  Performed at [facility], [address], [place], [place] [address]      Time coordinating discharge: 35 minutes  SIGNED:   Barb Merino, MD  [facility] 07/23/2020, 3:30 PM

## 2020-07-23 NOTE — ED Notes (Signed)
MD stating to this RN to trial pt off of oxygen. If pt maintains pt can be discharged.

## 2020-07-23 NOTE — Progress Notes (Signed)
Inpatient Diabetes Program Recommendations  AACE/ADA: New Consensus Statement on Inpatient Glycemic Control (2015)  Target Ranges:  Prepandial:   less than 140 mg/dL      Peak postprandial:   less than 180 mg/dL (1-2 hours)      Critically ill patients:  140 - 180 mg/dL   Lab Results  Component Value Date   GLUCAP 88 07/23/2020   HGBA1C 8.4 (H) 06/01/2020    Review of Glycemic Control Results for Maria Bentley, Maria RUFTY "CINDY" (MRN [id number]) as of 07/23/2020 09:24  Ref. Range 07/23/2020 00:00 07/23/2020 00:01 07/23/2020 01:17 07/23/2020 07:45  Glucose-Capillary Latest Ref Range: 70 - 99 mg/dL 39 (LL) 41 (LL) 84 88   Diabetes history: Type 2 DM Outpatient Diabetes medications: Lantus 33 units QHS, Novolog 5-20 units TID Current orders for Inpatient glycemic control: Lantus 33 units QHS, Novolog 5-20 units TID  Inpatient Diabetes Program Recommendations:    Noted glucose at 0001 was 39 mg/dL. In the setting of infection, consider:  -Changing correction to Novolog 0-9 units TID & HS - Discontinuing Lantus at this time, would plan to add Lantus 15 units QHS once glucose trends consistently above 180 mg/dL.   Thanks, Bronson Curb, MSN, RNC-OB Diabetes Coordinator [phone] (8a-5p)

## 2020-07-23 NOTE — ED Notes (Signed)
Pt given meal tray.

## 2020-07-23 NOTE — TOC Initial Note (Signed)
Transition of Care [facility]) - Initial/Assessment Note    Patient Details  Name: Maria Bentley MRN: [id number] Date of Birth: [birth year]  Transition of Care [facility]) CM/SW Contact:    Anselm Pancoast, RN Phone Number: 07/23/2020, 12:33 PM  Clinical Narrative:                 Spoke to patient who confirmed she has no needs at discharge. Patient has been home with her spouse since surgery for rotator cuff repair. Patient confirmed she is doing well at home and has her IS that she has been using wiwth great success. Pain is controlled and spouse is able to transport to and from any appointments. Patient confirmed she had just spoken with EDP and was planning to return home from emergency department since her O2 sats have improved and she has been able to use IS q1 hour as directed.         Patient Goals and CMS Choice        Expected Discharge Plan and Services                                                Prior Living Arrangements/Services                       Activities of Daily Living      Permission Sought/Granted                  Emotional Assessment              Admission diagnosis:  Hypoxia [R09.02] Patient Active Problem List   Diagnosis Date Noted  . Hypoxia 07/23/2020  . Renal mass, left 07/06/2020  . Aspiration into airway 06/16/2020  . Paronychia of great toe, right 06/16/2020  . Acute renal failure (ARF) ([place]) 06/02/2020  . Lactic acidosis 05/19/2020  . Abdominal distension 05/18/2020  . Chronic right shoulder pain 02/09/2020  . Insulin use (long-term) in type 2 diabetes ([place]) 01/26/2020  . 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 Recurrent headache 01/13/2020  . 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 Elevated alkaline phosphatase level 11/21/2019  . Hospital discharge follow-up 11/04/2019  . Cervical spondylosis without myelopathy 10/29/2019  . Pressure injury of skin 10/16/2019  . History  of gastric bypass 10/13/2019  . COVID-19 virus infection 10/06/2019  . Preoperative evaluation of a medical condition to rule out surgical contraindications (TAR required) 09/21/2019  . Fibromyalgia 06/05/2018  . Iron deficiency anemia 06/05/2018  . Diarrhea 04/21/2018  . Paronychia of great toe of left foot 11/24/2017  . Hypertension 11/24/2017  . Hip pain, chronic 12/28/2015  . Pain in joint, ankle and foot 12/28/2015  . 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 Right hip pain 05/30/2014  . Vitamin D deficiency 05/30/2014  . Tachycardia 08/15/2013  . Diabetes mellitus type 2, uncontrolled ([place]) 08/15/2013  . 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  . Morbid obesity ([place]) 06/09/2012  . Anxiety   . Headache, common migraine, intractable   . Hyperlipidemia   . Hypothyroid   . OSA (obstructive sleep apnea) 02/05/2012   PCP:  [name], MD Pharmacy:   [facility], [place] [address] [place] [address] Phone: [phone] Fax: [phone]     Social Determinants of  Health (SDOH) Interventions    Readmission Risk Interventions No flowsheet data found.

## 2020-07-23 NOTE — H&P (Addendum)
[facility] [place] NAME: Maria Bentley    MR#:  [id number]  DATE OF BIRTH:  [birth year]  DATE OF ADMISSION:  07/22/2020  PRIMARY CARE PHYSICIAN: [name], MD   REQUESTING/REFERRING PHYSICIAN: Conni Slipper, MD CHIEF COMPLAINT:   Chief Complaint  Patient presents with  . Shortness of Breath    HISTORY OF PRESENT ILLNESS:  Maria Bentley  is a 49 y.o. Caucasian female with a known history of hypertension, dyslipidemia, diabetes mellitus, COVID-19, depression and anxiety, who presented to the emergency room with a concern of worsening dyspnea with associated cough productive of yellow sputum as well as wheezing that started shortly after she had her right rotator cuff tear repair on 8/9.  She was sedated.  She denies any dysphagia or nausea or vomiting.  No chest pain or palpitations.  No fever or chills.  No dysuria, oliguria or hematuria or flank pain.  Upon presentation to the emergency room, vital signs were within normal except for pulse oximetry of 92 that later dropped to 88% on room air and was up to 95% on 2 L of O2 by nasal cannula.  Labs revealed albumin of 2.7 with total protein of 6.1 and otherwise unremarkable CMP.  BNP was elevated 855.8 and high-sensitivity troponin I was 9 and later 7.  CBC showed anemia at her baseline.  COVID-19 PCR came back negative.  Two-view chest x-ray showed chronic elevation of the right hemidiaphragm likely accentuated by overall low lung volumes/atelectasis including subsegmental right basilar atelectasis.  Chest CTA showed no evidence for PE.  It showed chronic elevation of the right hemidiaphragm that is stable compared to 10/24/2019 chest x-ray.  It showed right posterior medial lower lobe atelectasis and 19 mm rounded groundglass density peripherally in the left upper lobe could be infectious or inflammatory with recommendation for follow-up chest CT in 6 months.EKG showed normal sinus rhythm with rate of 85  with left axis deviation and poor R wave progression with T wave inversion in V1 through V3.  The patient was given 650 mg p.o. Tylenol.  She will be admitted to the medical monitored bed for further evaluation and management.  PAST MEDICAL HISTORY:   Past Medical History:  Diagnosis Date  . Abscess of right buttock 04/30/2019  . Anemia   . Anxiety   . COVID-19 virus infection   . Depression 2010  . Diabetes mellitus   . DKA, type 2 ([place]) 10/13/2019  . Endometriosis   . Headache, common migraine, intractable   . History of kidney stones   . History of ovarian cyst    left sided, caused by her endometriosis (possibly an endometrioma), in her 71s.  Marland Kitchen Hyperlipidemia   . Hypertension   . Kidney stones    left  . Morbid obesity ([place])    s/p gastric bypass  . Personal hx of gastric bypass 2010  . Rosacea   . Vertigo     PAST SURGICAL HISTORY:   Past Surgical History:  Procedure Laterality Date  . DILATION AND CURETTAGE OF UTERUS  2011  . gastric bypass    . OVARIAN CYST REMOVAL    . ROTATOR CUFF REPAIR     right  . SHOULDER ARTHROSCOPY WITH SUBACROMIAL DECOMPRESSION AND OPEN ROTATOR C Right 07/19/2020   Procedure: Right shoulder arthroscopic vs mini-open rotator cuff repair vs Regeneten patch application, subacomial dcompression, and biceps tenodesis;  Surgeon: Leim Fabry, MD;  Location: [facility] ORS;  Service: Orthopedics;  Laterality: Right;    SOCIAL HISTORY:   Social History   Tobacco Use  . Smoking status: Former Smoker    Packs/day: 1.00    Years: 4.00    Pack years: 4.00    Types: Cigarettes    Quit date: 02/05/1996    Years since quitting: 24.4  . Smokeless tobacco: Never Used  Substance Use Topics  . Alcohol use: No    FAMILY HISTORY:   Family History  Problem Relation Age of Onset  . Diabetes Mother   . Hypertension Mother   . Hypertension Father   . Diabetes Father   . Depression Other        strong hx of  . Breast cancer Paternal Grandmother 89    . Breast cancer Maternal Grandmother   . Cancer Neg Hx     DRUG ALLERGIES:   Allergies  Allergen Reactions  . Amoxicillin Hives and Rash  . Avocado Swelling  . Clarithromycin Hives and Rash  . Honey Anaphylaxis  . Other Hives and Swelling    All melon  . Sulfa Drugs Cross Reactors Anaphylaxis  . Watermelon Concentrate Hives and Swelling    Oral swelling, ears swell shut  . Adhesive [Tape] Itching    Skin breakdown  . Levemir [Insulin Detemir] Rash    Red/swollen injection site reactions with residual lumps  . Victoza [Liraglutide] Nausea Only    REVIEW OF SYSTEMS:   ROS As per history of present illness. All pertinent systems were reviewed above. Constitutional, HEENT, cardiovascular, respiratory, GI, GU, musculoskeletal, neuro, psychiatric, endocrine, integumentary and hematologic systems were reviewed and are otherwise negative/unremarkable except for positive findings mentioned above in the HPI.   MEDICATIONS AT HOME:   Prior to Admission medications   Medication Sig Start Date End Date Taking? Authorizing Provider  acetaminophen (TYLENOL) 500 MG tablet Take 2 tablets (1,000 mg total) by mouth every 8 (eight) hours. 07/19/20 07/19/21  Leim Fabry, MD  albuterol (VENTOLIN HFA) 108 (90 Base) MCG/ACT inhaler Inhale 2 puffs into the lungs every 6 (six) hours as needed for wheezing or shortness of breath. 10/02/19   Terald Sleeper, PA-C  amitriptyline (ELAVIL) 10 MG tablet TAKE 1 TABLET BY MOUTH EVERYDAY AT BEDTIME 07/19/20   [name], MD  aspirin EC 325 MG tablet Take 1 tablet (325 mg total) by mouth daily for 14 days. 07/19/20 08/02/20  Leim Fabry, MD  aspirin-acetaminophen-caffeine [facility] MIGRAINE) [phone] MG tablet Take 2 tablets by mouth every 6 (six) hours as needed for headache.    [provider]  atorvastatin (LIPITOR) 20 MG tablet TAKE 1 TABLET BY MOUTH EVERY DAY Patient taking differently: Take 20 mg by mouth daily.  05/16/19   [name], MD   azelastine (ASTELIN) 0.1 % nasal spray Place 2 sprays into both nostrils daily as needed for rhinitis or allergies.  12/22/19   [provider]  blood glucose meter kit and supplies KIT Dispense based on patient and insurance preference. Use up to four times daily as directed. (FOR ICD-E11.21) 08/22/16   [name], MD  buPROPion (WELLBUTRIN XL) 150 MG 24 hr tablet Take 450 mg by mouth daily.  05/12/18   [provider]  CLINPRO 5000 1.1 % PSTE Place 1 application onto teeth at bedtime.  06/15/20   [provider]  Continuous Blood Gluc Receiver (FREESTYLE LIBRE 14 DAY READER) DEVI 1 applicator by Does not apply route 4 (four) times daily. 11/24/17   [name], MD  Continuous Blood Gluc Sensor (FREESTYLE LIBRE 14 DAY SENSOR) MISC Inject 1 application into the skin 4 (four) times daily as needed (uncontrolled diabetes). 09/19/19   Sherlene Shams, MD  cyanocobalamin (,VITAMIN B-12,) 1000 MCG/ML injection INJECT 1 ML (1,000 MCG TOTAL) INTO THE MUSCLE ONCE A WEEK. FOR 4 WEEKS, THEN MONTHLY THEREAFTER 05/07/20   Sherlene Shams, MD  cyclobenzaprine (FLEXERIL) 10 MG tablet Take 5-10 mg by mouth 3 (three) times daily as needed for muscle spasms.  05/21/18   [provider]  diltiazem (CARDIZEM CD) 300 MG 24 hr capsule TAKE 1 CAPSULE BY MOUTH EVERY DAY Patient not taking: Reported on 07/08/2020 03/17/20   Alver Sorrow, NP  diphenhydrAMINE (BENADRYL) 25 MG tablet Take 25 mg by mouth every 6 (six) hours as needed for allergies.    [provider]  EPINEPHrine 0.3 mg/0.3 mL IJ SOAJ injection Inject 0.3 mg into the muscle as needed for anaphylaxis.  06/25/10   [provider]  escitalopram (LEXAPRO) 20 MG tablet Take 20 mg by mouth daily.  02/27/17   [provider]  fluocinonide cream (LIDEX) 0.05 % Apply sparingly up to 2 times a day-only on the  rash Patient not taking: Reported on 07/08/2020 03/25/20   Theadore Nan, NP  fluticasone  (FLONASE) 50 MCG/ACT nasal spray SPRAY 2 SPRAYS INTO EACH NOSTRIL EVERY DAY Patient taking differently: Place 2 sprays into both nostrils daily.  05/27/20   Sherlene Shams, MD  furosemide (LASIX) 20 MG tablet Take 1 tablet (20 mg total) by mouth daily. As needed for fluid retention Patient taking differently: Take 20 mg by mouth daily as needed for fluid.  06/15/20   Sherlene Shams, MD  HORIZANT 600 MG TBCR Take 600 mg by mouth 2 (two) times daily.  12/24/19   [provider]  Insulin Glargine (BASAGLAR KWIKPEN) 100 UNIT/ML SOPN Inject 0.5 mLs (50 Units total) into the skin daily. Patient taking differently: Inject 33 Units into the skin at bedtime.  12/11/19   Sherlene Shams, MD  Insulin Pen Needle (BD PEN NEEDLE NANO U/F) 32G X 4 MM MISC Please specify directions, refills and quantity 08/22/18   Sherlene Shams, MD  lamoTRIgine (LAMICTAL) 150 MG tablet Take 150 mg by mouth daily.    [provider]  Lancet Device MISC Test blood sugar two times daily 07/29/12   Sherlene Shams, MD  levothyroxine (SYNTHROID) 50 MCG tablet Take 1 tablet (50 mcg total) by mouth daily. 01/26/20   Sherlene Shams, MD  loperamide (IMODIUM) 2 MG capsule Take 2 capsules (4 mg total) by mouth as needed for diarrhea or loose stools. 10/25/19   Amin, Loura Halt, MD  LORazepam (ATIVAN) 0.5 MG tablet Take 0.5-1 mg by mouth daily as needed for anxiety.     [provider]  meclizine (ANTIVERT) 25 MG tablet Take 1 tablet (25 mg total) by mouth 3 (three) times daily as needed for dizziness. 06/16/15   Sherlene Shams, MD  Melatonin 10 MG TABS Take 10 mg by mouth at bedtime as needed (sleep).    [provider]  metolazone (ZAROXOLYN) 2.5 MG tablet Take daily, 30 minutes before furosemide Patient not taking: Reported on 07/08/2020 05/18/20   Sherlene Shams, MD  metoprolol tartrate (LOPRESSOR) 25 MG tablet Take 1 tablet (25 mg total) by mouth 2 (two) times daily. 06/15/20   Sherlene Shams, MD  NOVOLOG  FLEXPEN 100 UNIT/ML FlexPen INJECT 20 UNITS INTO THE  SKIN 3 TIMES DAILY WITH MEALS Patient taking differently: Inject 5-20 Units into the skin 3 (three) times daily with meals. Inject 15 units in the morning, 20 units at lunch and 5-10 units with dinner 05/11/20   [name], MD  nystatin (MYCOSTATIN/NYSTOP) powder APPLY 1 APPLICATION TO AFFECTED AREA TWICE A DAY Patient taking differently: Apply 1 application topically daily as needed (irritation).  06/15/20   [name], MD  OnabotulinumtoxinA (BOTOX IJ) Inject as directed every 3 (three) months. For migraine    [provider]  ondansetron (ZOFRAN ODT) 4 MG disintegrating tablet Take 1 tablet (4 mg total) by mouth every 8 (eight) hours as needed for nausea or vomiting. 07/19/20   Leim Fabry, MD  oxyCODONE (ROXICODONE) 5 MG immediate release tablet Take 1-2 tablets (5-10 mg total) by mouth every 4 (four) hours as needed (pain). 07/19/20 07/19/21  Leim Fabry, MD  promethazine (PHENERGAN) 25 MG tablet Take 25 mg by mouth every 6 (six) hours as needed for nausea or vomiting.  07/01/20   [provider]  Rimegepant Sulfate (NURTEC PO) Take by mouth as needed.    [provider]  SUMAtriptan (IMITREX) 100 MG tablet Take 100 mg by mouth every 2 (two) hours as needed for migraine. May repeat in 2 hours if headache persists or recurs. Patient not taking: Reported on 07/08/2020    [provider]  SYRINGE-NEEDLE, DISP, 3 ML (BD INTEGRA SYRINGE) 25G X 1" 3 ML MISC FOR USE WITH B12 INJECTIONS WEEKLY/MONTHLY 08/01/18   [name], MD  Vitamin D, Ergocalciferol, (DRISDOL) 1.25 MG (50000 UNIT) CAPS capsule TAKE 1 CAPSULE BY MOUTH ONE TIME PER WEEK Patient taking differently: Take 50,000 Units by mouth every 7 (seven) days.  04/12/20   [name], MD      VITAL SIGNS:  Blood pressure 115/76, pulse 90, temperature 98 F (36.7 C), temperature source Oral, resp. rate 14, height '5\' 4"'$  (1.626 m), weight 108.9 kg, last  menstrual period 05/09/2016, SpO2 100 %.  PHYSICAL EXAMINATION:  Physical Exam  GENERAL:  49 y.o.-year-old Caucasian female patient lying in the bed with no acute distress.  EYES: Pupils equal, round, reactive to light and accommodation. No scleral icterus. Extraocular muscles intact.  HEENT: Head atraumatic, normocephalic. Oropharynx and nasopharynx clear.  NECK:  Supple, no jugular venous distention. No thyroid enlargement, no tenderness.  LUNGS: Diminished left middle and upper lung zone breath sounds with associated mild crackles. CARDIOVASCULAR: Regular rate and rhythm, S1, S2 normal. No murmurs, rubs, or gallops.  ABDOMEN: Soft, nondistended, nontender. Bowel sounds present. No organomegaly or mass.  EXTREMITIES: No pedal edema, cyanosis, or clubbing.  NEUROLOGIC: Cranial nerves II through XII are intact. Muscle strength 5/5 in all extremities. Sensation intact. Gait not checked.  PSYCHIATRIC: The patient is alert and oriented x 3.  Normal affect and good eye contact. SKIN: She has chronic contact dermatitis erythematous eruption over her right medial malleolus and left lateral malleolus as well as her legS and feet with no other lesions or ulcers.  LABORATORY PANEL:   CBC Recent Labs  Lab 07/22/20 1613  WBC 10.6*  HGB 11.4*  HCT 38.3  PLT 404*   ------------------------------------------------------------------------------------------------------------------  Chemistries  Recent Labs  Lab 07/22/20 1613  NA 136  K 4.5  CL 102  CO2 24  GLUCOSE 136*  BUN 20  CREATININE 1.20*  CALCIUM 8.1*  AST 20  ALT 27  ALKPHOS 123  BILITOT 0.6   ------------------------------------------------------------------------------------------------------------------  Cardiac Enzymes  No results for input(s): TROPONINI in the last 168 hours. ------------------------------------------------------------------------------------------------------------------  RADIOLOGY:  DG Chest 2  View  Result Date: 07/22/2020 CLINICAL DATA:  Shortness of breath, shoulder surgery 3 days prior, history of COVID-19 EXAM: CHEST - 2 VIEW COMPARISON:  Radiograph 10/24/2019 FINDINGS: Chronic elevation of the right hemidiaphragm, likely accentuated by overall low lung volumes/atelectasis including more bandlike atelectasis in the right lung base. No focal consolidative opacity. No pneumothorax or visible effusion. The aorta is calcified. The remaining cardiomediastinal contours are unremarkable. No acute osseous or soft tissue abnormality. IMPRESSION: Chronic elevation of the right hemidiaphragm, likely accentuated by overall low lung volumes/atelectasis including subsegmental right basilar atelectasis. Electronically Signed   By: Lovena Le M.D.   On: 07/22/2020 16:52   CT Angio Chest PE W and/or Wo Contrast  Result Date: 07/22/2020 CLINICAL DATA:  Shortness of breath.  Recent shoulder surgery EXAM: CT ANGIOGRAPHY CHEST WITH CONTRAST TECHNIQUE: Multidetector CT imaging of the chest was performed using the standard protocol during bolus administration of intravenous contrast. Multiplanar CT image reconstructions and MIPs were obtained to evaluate the vascular anatomy. CONTRAST:  16m OMNIPAQUE IOHEXOL 350 MG/ML SOLN COMPARISON:  None. FINDINGS: Cardiovascular: No filling defects in the pulmonary arteries to suggest pulmonary emboli. Heart is normal size. Aorta is normal caliber. Mediastinum/Nodes: No mediastinal, hilar, or axillary adenopathy. Lungs/Pleura: Chronic elevation of the right hemidiaphragm. There is medial right lower lobe atelectasis. Ground-glass opacity noted peripherally in the left upper lobe, rounded measuring 19 mm. No effusions. Upper Abdomen: Diffuse fatty infiltration of the liver. Prior cholecystectomy and gastric bypass. Musculoskeletal: Chest wall soft tissues are unremarkable. No acute bony abnormality. Review of the MIP images confirms the above findings. IMPRESSION: No evidence of  pulmonary embolus. Chronic elevation of the right hemidiaphragm, stable dating back to prior chest x-ray from 10/24/2019. Right posteromedial lower lobe atelectasis. 19 mm rounded ground-glass opacity peripherally in the left upper lobe. This could be infectious or inflammatory, but follow-up chest CT in 6 months is recommended to assess for resolution. Electronically Signed   By: KRolm BaptiseM.D.   On: 07/22/2020 22:36      IMPRESSION AND PLAN:   1.  Acute hypoxic respiratory failure likely secondary to left upper lobe pneumonia, possibly aspiration. -The patient will be admitted to a medical monitored bed. -Continue antibiotic therapy with IV Levaquin. -We will check sputum culture as well as pneumonia antigens. -Mucolytic therapy and bronchodilator therapy will be provided. -We will follow blood cultures.  2.  Acute on chronic diastolic CHF, with differential diagnosis including cor pulmonale. Neck -The patient will be diuresed with IV Lasix. -We will follow serial troponin I's. -We will obtain a 2D echo in a.m. -Last 2D echo was in November/2020 revealing an EF of 60% - 65% headache with grade 1 diastolic dysfunction.  3.  Dyslipidemia. -We will continue Lipitor.  4.  Depression. -We will continue admission XL.  5.  Hypertension. -We will continue Cardizem CD.  6.  Type 2 diabetes mellitus with peripheral neuropathy. -We will continue Neurontin. -The patient will be placed on supplement coverage with NovoLog. -We will continue basal coverage.  7.  Contact dermatitis. -We will place her on triamcinolone cream.  8.  DVT prophylaxis. -Subcutaneous Lovenox.   All the records are reviewed and case discussed with ED provider. The plan of care was discussed in details with the patient (and family). I answered all questions. The patient agreed to proceed with the above mentioned plan. Further management will depend  upon hospital course.   CODE STATUS: Full code  Status  is: Inpatient  Remains inpatient appropriate because:Ongoing diagnostic testing needed not appropriate for outpatient work up, Unsafe d/c plan, IV treatments appropriate due to intensity of illness or inability to take PO and Inpatient level of care appropriate due to severity of illness   Dispo: The patient is from: Home              Anticipated d/c is to: Home              Anticipated d/c date is: 2 days              Patient currently is not medically stable to d/c.    TOTAL TIME TAKING CARE OF THIS PATIENT: 55 minutes.    Christel Mormon M.D on 07/23/2020 at 12:23 AM  [facility]   From 7 PM-7 AM, contact night-coverage [email or web address]  CC: Primary care physician; [name], MD   Note: This dictation was prepared with Dragon dictation along with smaller phrase technology. Any transcriptional typo errors that result from this process are unintentional.

## 2020-07-23 NOTE — ED Notes (Signed)
CBG 88. Pt given apple juice and graham crackers as requested.

## 2020-07-23 NOTE — ED Notes (Signed)
Pt requested CBG stating that her blood sugar tended to run low. Test shows CBG of 41. Dr Rip Harbour notified. Pt given two containers of apple juice. Pt alert and oriented.

## 2020-07-23 NOTE — ED Notes (Signed)
Meal tray given 

## 2020-07-25 LAB — LEGIONELLA PNEUMOPHILA SEROGP 1 UR AG: L. pneumophila Serogp 1 Ur Ag: NEGATIVE

## 2020-07-26 LAB — GLUCOSE, CAPILLARY: Glucose-Capillary: 39 mg/dL — CL (ref 70–99)

## 2020-07-27 ENCOUNTER — Ambulatory Visit: Payer: BC Managed Care – PPO | Admitting: Podiatry

## 2020-08-06 ENCOUNTER — Other Ambulatory Visit: Payer: Self-pay | Admitting: Internal Medicine

## 2020-08-06 MED ORDER — DIPHENOXYLATE-ATROPINE 2.5-0.025 MG PO TABS
1.0000 | ORAL_TABLET | Freq: Four times a day (QID) | ORAL | 1 refills | Status: DC | PRN
Start: 2020-08-06 — End: 2021-08-30

## 2020-08-08 ENCOUNTER — Other Ambulatory Visit: Payer: Self-pay | Admitting: Internal Medicine

## 2020-08-08 MED ORDER — AMITRIPTYLINE HCL 25 MG PO TABS: 25.0000 mg | ORAL_TABLET | Freq: Every day | ORAL | 0 refills | Status: DC

## 2020-08-10 NOTE — Telephone Encounter (Signed)
Pt was advised that she should not use mychart for things like this and that it should only be used for non urgent things. Pt gave a verbal understanding.

## 2020-08-13 ENCOUNTER — Other Ambulatory Visit: Payer: Self-pay | Admitting: Internal Medicine

## 2020-08-14 ENCOUNTER — Other Ambulatory Visit: Payer: Self-pay | Admitting: Internal Medicine

## 2020-08-16 ENCOUNTER — Ambulatory Visit: Payer: BC Managed Care – PPO | Admitting: Gastroenterology

## 2020-08-18 ENCOUNTER — Encounter: Payer: Self-pay | Admitting: Internal Medicine

## 2020-08-18 ENCOUNTER — Other Ambulatory Visit: Payer: Self-pay

## 2020-08-18 ENCOUNTER — Ambulatory Visit (INDEPENDENT_AMBULATORY_CARE_PROVIDER_SITE_OTHER): Payer: BC Managed Care – PPO | Admitting: Internal Medicine

## 2020-08-18 VITALS — BP 138/82 | HR 89 | Temp 98.5°F | Resp 15 | Ht 64.0 in | Wt 243.4 lb

## 2020-08-18 DIAGNOSIS — R0902 Hypoxemia: Secondary | ICD-10-CM

## 2020-08-18 DIAGNOSIS — I2584 Coronary atherosclerosis due to calcified coronary lesion: Secondary | ICD-10-CM

## 2020-08-18 DIAGNOSIS — E161 Other hypoglycemia: Secondary | ICD-10-CM

## 2020-08-18 DIAGNOSIS — E033 Postinfectious hypothyroidism: Secondary | ICD-10-CM

## 2020-08-18 DIAGNOSIS — I2510 Atherosclerotic heart disease of native coronary artery without angina pectoris: Secondary | ICD-10-CM

## 2020-08-18 DIAGNOSIS — R197 Diarrhea, unspecified: Secondary | ICD-10-CM | POA: Diagnosis not present

## 2020-08-18 DIAGNOSIS — E11649 Type 2 diabetes mellitus with hypoglycemia without coma: Secondary | ICD-10-CM

## 2020-08-18 DIAGNOSIS — N2889 Other specified disorders of kidney and ureter: Secondary | ICD-10-CM | POA: Diagnosis not present

## 2020-08-18 DIAGNOSIS — Z23 Encounter for immunization: Secondary | ICD-10-CM

## 2020-08-18 NOTE — Patient Instructions (Signed)
Stop the mealtime insulin   And resume the basaglar   At 30 units

## 2020-08-18 NOTE — Progress Notes (Signed)
Subjective:  Patient ID: Maria Bentley, female    DOB: [birth year]  Age: 49 y.o. MRN: [id number]  CC: The primary encounter diagnosis was Need for immunization against influenza. Diagnoses of Diarrhea, unspecified type, Uncontrolled type 2 diabetes mellitus with hypoglycemia without coma ([place]), Postinfectious hypothyroidism, Renal mass, left, Hypoxia, Hypoglycemic reaction, and Coronary atherosclerosis due to calcified coronary lesion were also pertinent to this visit.  HPI Maria Bentley presents for follow up on 1) ER evaluation august 12 for hypoxia   Type 2 DM uncontrolled  With kidney complications , FM managed with Elavil  This visit occurred during the SARS-CoV-2 public health emergency.  Safety protocols were in place, including screening questions prior to the visit, additional usage of staff PPE, and extensive cleaning of exam room while observing appropriate contact time as indicated for disinfecting solutions.    Patient has received both doses of the available COVID 19 vaccine without complications.  Patient continues to mask when outside of the home except when walking in yard or at safe distances from others .  Patient denies any change in mood or development of unhealthy behaviors resuting from the pandemic's restriction of activities and socialization.    Recent episode of hypoxia occurred following  right shoulder arthroscopy on  august 9 that included a scalene block. .  ER visit August 12 for SOB,  Hypoxia to 88%.  2 cm LUL nodule on CTA as well as LAD atherosclerosis noted ..  6 month follow up imaging needed on lung nodule,  And statin therapy already on  board.  She has had Chronic hypoxia since COVID infection. She is unable to climb the stairs at work without considerable dyspnea  And requires a note from MD to use elevator.   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  Has anywhere from 10 to 15 loose  stools per day.   Occurring at night as well. Using lomotil prn with transient improvement.  Following a vegetable based diet,  Due to anorexia and nausea which occurs several times per week without vomiting.  She has a remote history of gastric bypass and has unfortunately regained a significant amount of weight .  Abdominal girth has increased again . Reviewed last abd u/s done for same reason 3 months ago: no ascites.   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  fatigue seems more than usual.  Sciatica has been acting up  Both legs affected   ,  Some leg weakness. Has had ESI in the past and has been lost to follow up with DR  Chasnis.   Chronic right hemidiaphragm elevation noted on CXR.  ,  Suspect restrictive lung disease.  Using incentive spirometry which has improved symptoms of dyspnea .  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    Legs swelling started again,  Using furosemide.   T2DM, uncontrolled:L  She has been having frequent lows on current regimen .  Currently taking 30 units basaglar and mealtime insulin .  Omitted last night's dose and still had a 54  Before lunch   Renal mass:  MRI ordered again in July,  But not done, to evaluate 3 cm mass seen on left kidney during last CT    Lab Results  Component Value Date  HGBA1C 8.4 (H) 06/01/2020     Outpatient Medications Prior to Visit  Medication Sig Dispense Refill  . acetaminophen (TYLENOL) 500 MG tablet Take 2 tablets (1,000 mg total) by mouth every 8 (eight) hours. 90 tablet 2  . albuterol (VENTOLIN HFA) 108 (90 Base) MCG/ACT inhaler Inhale 2 puffs into the lungs every 6 (six) hours as needed for wheezing or shortness of breath. 18 g 0  . amitriptyline (ELAVIL) 10 MG tablet Take 10 mg by mouth at bedtime.    Marland Kitchen aspirin-acetaminophen-caffeine (EXCEDRIN MIGRAINE) 250-250-65 MG tablet Take 2 tablets by mouth  every 6 (six) hours as needed for headache.    Marland Kitchen atorvastatin (LIPITOR) 20 MG tablet TAKE 1 TABLET BY MOUTH EVERY DAY (Patient taking differently: Take 20 mg by mouth daily. ) 90 tablet 0  . azelastine (ASTELIN) 0.1 % nasal spray Place 2 sprays into both nostrils daily as needed for rhinitis or allergies.     . blood glucose meter kit and supplies KIT Dispense based on patient and insurance preference. Use up to four times daily as directed. (FOR ICD-E11.21) 1 each 0  . buPROPion (WELLBUTRIN XL) 150 MG 24 hr tablet Take 450 mg by mouth daily.   3  . CLINPRO 5000 1.1 % PSTE Place 1 application onto teeth at bedtime.     . Continuous Blood Gluc Receiver (FREESTYLE LIBRE 14 DAY READER) DEVI 1 applicator by Does not apply route 4 (four) times daily. 1 Device 1  . Continuous Blood Gluc Sensor (FREESTYLE LIBRE 14 DAY SENSOR) MISC Inject 1 application into the skin 4 (four) times daily as needed (uncontrolled diabetes). 2 each 11  . cyanocobalamin (,VITAMIN B-12,) 1000 MCG/ML injection INJECT 1 ML (1,000 MCG TOTAL) INTO THE MUSCLE ONCE A WEEK. FOR 4 WEEKS, THEN MONTHLY THEREAFTER 6 mL 4  . cyclobenzaprine (FLEXERIL) 10 MG tablet Take 5-10 mg by mouth 3 (three) times daily as needed for muscle spasms.   12  . diphenhydrAMINE (BENADRYL) 25 MG tablet Take 25 mg by mouth every 6 (six) hours as needed for allergies.    . diphenoxylate-atropine (LOMOTIL) 2.5-0.025 MG tablet Take 1 tablet by mouth 4 (four) times daily as needed for diarrhea or loose stools. 30 tablet 1  . EPINEPHrine 0.3 mg/0.3 mL IJ SOAJ injection Inject 0.3 mg into the muscle as needed for anaphylaxis.     Marland Kitchen escitalopram (LEXAPRO) 20 MG tablet Take 20 mg by mouth daily.     . fluticasone (FLONASE) 50 MCG/ACT nasal spray SPRAY 2 SPRAYS INTO EACH NOSTRIL EVERY DAY (Patient taking differently: Place 2 sprays into both nostrils daily. ) 48 mL 1  . furosemide (LASIX) 20 MG tablet TAKE 1 TABLET (20 MG TOTAL) BY MOUTH DAILY. AS NEEDED FOR FLUID  RETENTION 90 tablet 1  . HORIZANT 600 MG TBCR Take 600 mg by mouth 2 (two) times daily.     . Insulin Glargine (BASAGLAR KWIKPEN) 100 UNIT/ML SOPN Inject 0.5 mLs (50 Units total) into the skin daily. (Patient taking differently: Inject 33 Units into the skin at bedtime. ) 45 mL 3  . Insulin Pen Needle (BD PEN NEEDLE NANO U/F) 32G X 4 MM MISC Please specify directions, refills and quantity 100 each 11  . lamoTRIgine (LAMICTAL) 150 MG tablet Take 150 mg by mouth daily.    [name] Device MISC Test blood sugar two times daily 100 each 6  . levothyroxine (SYNTHROID) 50 MCG tablet Take 1 tablet (50 mcg total) by mouth daily. 90 tablet 3  .  loperamide (IMODIUM) 2 MG capsule Take 2 capsules (4 mg total) by mouth as needed for diarrhea or loose stools. 30 capsule 0  . LORazepam (ATIVAN) 0.5 MG tablet Take 0.5-1 mg by mouth daily as needed for anxiety.     . meclizine (ANTIVERT) 25 MG tablet Take 1 tablet (25 mg total) by mouth 3 (three) times daily as needed for dizziness. 90 tablet 6  . Melatonin 10 MG TABS Take 10 mg by mouth at bedtime as needed (sleep).    . metoprolol tartrate (LOPRESSOR) 25 MG tablet Take 1 tablet (25 mg total) by mouth 2 (two) times daily. 180 tablet 3  . NOVOLOG FLEXPEN 100 UNIT/ML FlexPen INJECT 20 UNITS INTO THE SKIN 3 TIMES DAILY WITH MEALS (Patient taking differently: Inject 5-20 Units into the skin 3 (three) times daily with meals. Inject 15 units in the morning, 20 units at lunch and 5-10 units with dinner) 15 mL 2  . nystatin (MYCOSTATIN/NYSTOP) powder APPLY 1 APPLICATION TO AFFECTED AREA TWICE A DAY (Patient taking differently: Apply 1 application topically daily as needed (irritation). ) 60 g 0  . OnabotulinumtoxinA (BOTOX IJ) Inject as directed every 3 (three) months. For migraine    . ondansetron (ZOFRAN ODT) 4 MG disintegrating tablet Take 1 tablet (4 mg total) by mouth every 8 (eight) hours as needed for nausea or vomiting. 20 tablet 0  . promethazine (PHENERGAN) 25 MG  tablet Take 25 mg by mouth every 6 (six) hours as needed for nausea or vomiting.     . Rimegepant Sulfate (NURTEC PO) Take by mouth as needed.    . SYRINGE-NEEDLE, DISP, 3 ML (BD INTEGRA SYRINGE) 25G X 1" 3 ML MISC FOR USE WITH B12 INJECTIONS WEEKLY/MONTHLY 25 each 0  . Vitamin D, Ergocalciferol, (DRISDOL) 1.25 MG (50000 UNIT) CAPS capsule TAKE 1 CAPSULE BY MOUTH ONE TIME PER WEEK (Patient taking differently: Take 50,000 Units by mouth every 7 (seven) days. ) 12 capsule 1  . amitriptyline (ELAVIL) 25 MG tablet Take 1 tablet (25 mg total) by mouth at bedtime. (Patient not taking: Reported on 08/18/2020) 90 tablet 0  . oxyCODONE (ROXICODONE) 5 MG immediate release tablet Take 1-2 tablets (5-10 mg total) by mouth every 4 (four) hours as needed (pain). (Patient not taking: Reported on 08/18/2020) 30 tablet 0  . SUMAtriptan (IMITREX) 100 MG tablet Take 100 mg by mouth every 2 (two) hours as needed for migraine. May repeat in 2 hours if headache persists or recurs.  (Patient not taking: Reported on 08/18/2020)     No facility-administered medications prior to visit.    Review of Systems;  Patient denies headache, fevers, malaise, unintentional weight loss, skin rash, eye pain, sinus congestion and sinus pain, sore throat, dysphagia,  hemoptysis , cough, dyspnea, wheezing, chest pain, palpitations, orthopnea, edema, abdominal pain, nausea, melena, diarrhea, constipation, flank pain, dysuria, hematuria, urinary  Frequency, nocturia, numbness, tingling, seizures,  Focal weakness, Loss of consciousness,  Tremor, insomnia, depression, anxiety, and suicidal ideation.      Objective:  BP 138/82 (BP Location: Left Arm, Patient Position: Sitting, Cuff Size: Normal)   Pulse 89   Temp 98.5 F (36.9 C) (Oral)   Resp 15   Ht _0  (1.626 m)   Wt 243 lb 6.4 oz (110.4 kg)   LMP 05/09/2016 (LMP Unknown) Comment: D/C  SpO2 93%   BMI 41.78 kg/m   BP Readings from Last 3 Encounters:  08/18/20 138/82  07/23/20  140/67  07/19/20 91/60    Wt  Readings from Last 3 Encounters:  08/18/20 243 lb 6.4 oz (110.4 kg)  07/22/20 240 lb (108.9 kg)  07/05/20 (!) 239 lb (108.4 kg)    General appearance: alert, cooperative and appears stated age Ears: normal TM's and external ear canals both ears Throat: lips, mucosa, and tongue normal; teeth and gums normal Neck: no adenopathy, no carotid bruit, supple, symmetrical, trachea midline and thyroid not enlarged, symmetric, no tenderness/mass/nodules Back: symmetric, no curvature. ROM normal. No CVA tenderness. Lungs: clear to auscultation bilaterally Heart: regular rate and rhythm, S1, S2 normal, no murmur, click, rub or gallop Abdomen: soft, non-tender; bowel sounds normal; no masses,  no organomegaly Pulses: 2+ and symmetric Skin: Skin color, texture, turgor normal. No rashes or lesions Lymph nodes: Cervical, supraclavicular, and axillary nodes normal.  Lab Results  Component Value Date   HGBA1C 8.4 (H) 06/01/2020   HGBA1C 8.1 (H) 01/23/2020   HGBA1C 14.7 (A) 09/19/2019    Lab Results  Component Value Date   CREATININE 1.20 (H) 07/22/2020   CREATININE 1.28 (H) 07/15/2020   CREATININE 1.26 (H) 06/23/2020    Lab Results  Component Value Date   WBC 10.6 (H) 07/22/2020   HGB 11.4 (L) 07/22/2020   HCT 38.3 07/22/2020   PLT 404 (H) 07/22/2020   GLUCOSE 136 (H) 07/22/2020   CHOL 194 09/19/2019   TRIG 206 (H) 10/20/2019   HDL 48 (L) 09/19/2019   LDLDIRECT 92.0 02/21/2017   LDLCALC 94 09/19/2019   ALT 27 07/22/2020   AST 20 07/22/2020   NA 136 07/22/2020   K 4.5 07/22/2020   CL 102 07/22/2020   CREATININE 1.20 (H) 07/22/2020   BUN 20 07/22/2020   CO2 24 07/22/2020   TSH 2.06 03/11/2020   INR 0.9 10/25/2019   HGBA1C 8.4 (H) 06/01/2020   MICROALBUR 1.8 09/19/2019    DG Chest 2 View  Result Date: 07/22/2020 CLINICAL DATA:  Shortness of breath, shoulder surgery 3 days prior, history of COVID-19 EXAM: CHEST - 2 VIEW COMPARISON:  Radiograph  10/24/2019 FINDINGS: Chronic elevation of the right hemidiaphragm, likely accentuated by overall low lung volumes/atelectasis including more bandlike atelectasis in the right lung base. No focal consolidative opacity. No pneumothorax or visible effusion. The aorta is calcified. The remaining cardiomediastinal contours are unremarkable. No acute osseous or soft tissue abnormality. IMPRESSION: Chronic elevation of the right hemidiaphragm, likely accentuated by overall low lung volumes/atelectasis including subsegmental right basilar atelectasis. Electronically Signed   By: Lovena Le M.D.   On: 07/22/2020 16:52   CT Angio Chest PE W and/or Wo Contrast  Result Date: 07/22/2020 CLINICAL DATA:  Shortness of breath.  Recent shoulder surgery EXAM: CT ANGIOGRAPHY CHEST WITH CONTRAST TECHNIQUE: Multidetector CT imaging of the chest was performed using the standard protocol during bolus administration of intravenous contrast. Multiplanar CT image reconstructions and MIPs were obtained to evaluate the vascular anatomy. CONTRAST:  48m OMNIPAQUE IOHEXOL 350 MG/ML SOLN COMPARISON:  None. FINDINGS: Cardiovascular: No filling defects in the pulmonary arteries to suggest pulmonary emboli. Heart is normal size. Aorta is normal caliber. Mediastinum/Nodes: No mediastinal, hilar, or axillary adenopathy. Lungs/Pleura: Chronic elevation of the right hemidiaphragm. There is medial right lower lobe atelectasis. Ground-glass opacity noted peripherally in the left upper lobe, rounded measuring 19 mm. No effusions. Upper Abdomen: Diffuse fatty infiltration of the liver. Prior cholecystectomy and gastric bypass. Musculoskeletal: Chest wall soft tissues are unremarkable. No acute bony abnormality. Review of the MIP images confirms the above findings. IMPRESSION: No evidence of pulmonary embolus. Chronic  elevation of the right hemidiaphragm, stable dating back to prior chest x-ray from 10/24/2019. Right posteromedial lower lobe  atelectasis. 19 mm rounded ground-glass opacity peripherally in the left upper lobe. This could be infectious or inflammatory, but follow-up chest CT in 6 months is recommended to assess for resolution. Electronically Signed   By: Rolm Baptise M.D.   On: 07/22/2020 22:36    Assessment & Plan:   Problem List Items Addressed This Visit      Unprioritized   Renal mass, left    MRI was  ordered in July  to provide further evaluation of 3 cm incidental mass noted in July on left kidney, but has been deferred by patient due to mutiple conflicting  ongoing unresolved issues.  She sees Nephrology soon for AKI /CKD; will urge her to get this done prior to appointment    Lab Results  Component Value Date   CREATININE 1.20 (H) 07/22/2020         Hypoxia    Chronic now,  Since COVID infection.  Likely multifactorial (scarring,   abdominal obesity may be causing restrictive  lung disease).  PFTs needed pulmonary referral in progress      Hypothyroid   Relevant Orders   TSH   Hypoglycemic reaction    Occurred in office today during lab draw.  Patient was treated and observed for 30 minutes with repeat CBG improved form 47 to 99.  Mealtime insulin stopped.       Diarrhea    Nocturnal symptoms and history of fatty liver, pancreatic atrophy .  Studies pending.  GI referral pending.  continue Lomitil prn       Relevant Orders   Clostridium Difficile by PCR   Pancreatic Elastase, Fecal   Gastrointestinal Panel by PCR , Stool   Stool culture   Diabetes mellitus type 2, uncontrolled ([facility])    Stopping mealtime insulin to address recurrent lows, which she exhibited here in office today and was treated       Relevant Orders   Comprehensive metabolic panel   Hemoglobin A1c   Coronary atherosclerosis due to calcified coronary lesion    Noted on recent CTA done in ER to evaluate hypoxemia. discussed with patient . She is taking atorvastatin regularly       Other Visit Diagnoses    Need for  immunization against influenza    -  Primary   Relevant Orders   Flu Vaccine QUAD 36+ mos IM (Completed)    A total of 40 minutes was spent with patient more than half of which was spent in counseling patient on the above mentioned issues , reviewing and explaining recent labs and imaging studies done, and coordination of care.  I have discontinued Sumner Boesch. Riel "Cindy"'s SUMAtriptan and oxyCODONE. I am also having her maintain her Lancet Device, OnabotulinumtoxinA (BOTOX IJ), LORazepam, meclizine, blood glucose meter kit and supplies, EPINEPHrine, escitalopram, FreeStyle Libre 14 Day Reader, cyclobenzaprine, buPROPion, SYRINGE-NEEDLE (DISP) 3 ML, Insulin Pen Needle, atorvastatin, lamoTRIgine, FreeStyle Libre 14 Day Sensor, albuterol, aspirin-acetaminophen-caffeine, loperamide, Basaglar KwikPen, azelastine, Horizant, levothyroxine, Vitamin D (Ergocalciferol), cyanocobalamin, NovoLOG FlexPen, fluticasone, nystatin, metoprolol tartrate, Clinpro 5000, promethazine, diphenhydrAMINE, Melatonin, Rimegepant Sulfate (NURTEC PO), ondansetron, acetaminophen, diphenoxylate-atropine, furosemide, and amitriptyline.  No orders of the defined types were placed in this encounter.   Medications Discontinued During This Encounter  Medication Reason  . oxyCODONE (ROXICODONE) 5 MG immediate release tablet Completed Course  . SUMAtriptan (IMITREX) 100 MG tablet   . amitriptyline (ELAVIL) 25 MG tablet  Follow-up: No follow-ups on file.   [name], MD

## 2020-08-21 DIAGNOSIS — E161 Other hypoglycemia: Secondary | ICD-10-CM | POA: Insufficient documentation

## 2020-08-21 DIAGNOSIS — I2510 Atherosclerotic heart disease of native coronary artery without angina pectoris: Secondary | ICD-10-CM | POA: Insufficient documentation

## 2020-08-21 NOTE — Assessment & Plan Note (Addendum)
MRI was  ordered in July  to provide further evaluation of 3 cm incidental mass noted in July on left kidney, but has been deferred by patient due to mutiple conflicting  ongoing unresolved issues.  She sees Nephrology soon for AKI /CKD; will urge her to get this done prior to appointment    Lab Results  Component Value Date   CREATININE 1.20 (H) 07/22/2020

## 2020-08-21 NOTE — Assessment & Plan Note (Signed)
Occurred in office today during lab draw.  Patient was treated and observed for 30 minutes with repeat CBG improved form 47 to 99.  Mealtime insulin stopped.

## 2020-08-21 NOTE — Assessment & Plan Note (Signed)
Stopping mealtime insulin to address recurrent lows, which she exhibited here in office today and was treated

## 2020-08-21 NOTE — Assessment & Plan Note (Signed)
Chronic now,  Since COVID infection.  Likely multifactorial (scarring,   abdominal obesity may be causing restrictive  lung disease).  PFTs needed pulmonary referral in progress

## 2020-08-21 NOTE — Assessment & Plan Note (Signed)
Nocturnal symptoms and history of fatty liver, pancreatic atrophy .  Studies pending.  GI referral pending.  continue Lomitil prn

## 2020-08-21 NOTE — Assessment & Plan Note (Signed)
Noted on recent CTA done in ER to evaluate hypoxemia. discussed with patient . She is taking atorvastatin regularly

## 2020-08-24 DIAGNOSIS — G4440 Drug-induced headache, not elsewhere classified, not intractable: Secondary | ICD-10-CM | POA: Insufficient documentation

## 2020-08-24 DIAGNOSIS — F458 Other somatoform disorders: Secondary | ICD-10-CM | POA: Insufficient documentation

## 2020-08-24 DIAGNOSIS — M26609 Unspecified temporomandibular joint disorder, unspecified side: Secondary | ICD-10-CM | POA: Insufficient documentation

## 2020-08-24 DIAGNOSIS — G909 Disorder of the autonomic nervous system, unspecified: Secondary | ICD-10-CM | POA: Insufficient documentation

## 2020-08-24 DIAGNOSIS — M797 Fibromyalgia: Secondary | ICD-10-CM | POA: Insufficient documentation

## 2020-08-24 DIAGNOSIS — G501 Atypical facial pain: Secondary | ICD-10-CM | POA: Insufficient documentation

## 2020-08-24 DIAGNOSIS — R030 Elevated blood-pressure reading, without diagnosis of hypertension: Secondary | ICD-10-CM | POA: Insufficient documentation

## 2020-08-24 DIAGNOSIS — G542 Cervical root disorders, not elsewhere classified: Secondary | ICD-10-CM | POA: Insufficient documentation

## 2020-08-25 ENCOUNTER — Ambulatory Visit (INDEPENDENT_AMBULATORY_CARE_PROVIDER_SITE_OTHER): Payer: BC Managed Care – PPO | Admitting: Gastroenterology

## 2020-08-25 ENCOUNTER — Other Ambulatory Visit: Payer: Self-pay

## 2020-08-25 ENCOUNTER — Encounter: Payer: Self-pay | Admitting: Gastroenterology

## 2020-08-25 ENCOUNTER — Other Ambulatory Visit
Admission: RE | Admit: 2020-08-25 | Discharge: 2020-08-25 | Disposition: A | Discharge status: Home / Self Care | Payer: BC Managed Care – PPO | Source: Ambulatory Visit | Attending: Internal Medicine | Admitting: Internal Medicine

## 2020-08-25 ENCOUNTER — Other Ambulatory Visit: Payer: Self-pay | Admitting: Internal Medicine

## 2020-08-25 ENCOUNTER — Telehealth: Payer: Self-pay | Admitting: Internal Medicine

## 2020-08-25 ENCOUNTER — Encounter: Payer: Self-pay | Admitting: Internal Medicine

## 2020-08-25 VITALS — BP 144/88 | HR 102 | Temp 98.0°F | Ht 64.0 in | Wt 237.0 lb

## 2020-08-25 DIAGNOSIS — Z1211 Encounter for screening for malignant neoplasm of colon: Secondary | ICD-10-CM

## 2020-08-25 DIAGNOSIS — I129 Hypertensive chronic kidney disease with stage 1 through stage 4 chronic kidney disease, or unspecified chronic kidney disease: Secondary | ICD-10-CM | POA: Insufficient documentation

## 2020-08-25 DIAGNOSIS — R197 Diarrhea, unspecified: Secondary | ICD-10-CM | POA: Insufficient documentation

## 2020-08-25 DIAGNOSIS — R319 Hematuria, unspecified: Secondary | ICD-10-CM | POA: Insufficient documentation

## 2020-08-25 DIAGNOSIS — N1831 Chronic kidney disease, stage 3a: Secondary | ICD-10-CM | POA: Insufficient documentation

## 2020-08-25 DIAGNOSIS — D631 Anemia in chronic kidney disease: Secondary | ICD-10-CM | POA: Insufficient documentation

## 2020-08-25 LAB — GASTROINTESTINAL PANEL BY PCR, STOOL (REPLACES STOOL CULTURE)

## 2020-08-25 MED ORDER — COLESTIPOL HCL 1 G PO TABS: 2.0000 g | ORAL_TABLET | Freq: Every day | ORAL | Status: DC

## 2020-08-25 MED ORDER — AZITHROMYCIN 500 MG PO TABS: 500.0000 mg | ORAL_TABLET | Freq: Every day | ORAL | 0 refills | Status: DC

## 2020-08-25 MED ORDER — PREDNISONE 10 MG PO TABS: ORAL_TABLET | ORAL | 0 refills | Status: DC

## 2020-08-25 MED ORDER — PEG 3350-KCL-NA BICARB-NACL 420 G PO SOLR: ORAL | 0 refills | Status: DC

## 2020-08-25 NOTE — Telephone Encounter (Signed)
Patient notified via My Chart

## 2020-08-25 NOTE — Telephone Encounter (Signed)
Lab called.Positive stool positive stool  for enteropathogenic Ecoli.

## 2020-08-26 NOTE — Progress Notes (Signed)
Maria Bentley [address]  [place]  [place], [place] [address]  Main: [phone]  Fax: [phone]   Gastroenterology Consultation  Referring Provider:     [name], MD Primary Care Physician:  [name], MD Reason for Consultation:   Diarrhea        HPI:    Chief Complaint  Patient presents with  . Diarrhea    Patient has had diarrhea for several days.    Maria Bentley is a 49 y.o. y/o female referred for consultation & management  by Dr. [name], MD.  Patient reports chronic history of loose stools, 2-3 loose stools a day, with recent exacerbation.  Stool testing was positive for EPEC today.  No blood in stool.  No nausea or vomiting.  No prior work-up of chronic loose stools.  Past Medical History:  Diagnosis Date  . Abscess of right buttock 04/30/2019  . Anemia   . Anxiety   . COVID-19 virus infection   . Depression 2010  . Diabetes mellitus   . DKA, type 2 ([place]) 10/13/2019  . Endometriosis   . Headache, common migraine, intractable   . History of kidney stones   . History of ovarian cyst    left sided, caused by her endometriosis (possibly an endometrioma), in her 58s.  Marland Kitchen Hyperlipidemia   . Hypertension   . Kidney stones    left  . Morbid obesity ([place])    s/p gastric bypass  . Personal hx of gastric bypass 2010  . Rosacea   . Vertigo     Past Surgical History:  Procedure Laterality Date  . DILATION AND CURETTAGE OF UTERUS  2011  . gastric bypass    . OVARIAN CYST REMOVAL    . ROTATOR CUFF REPAIR     right  . SHOULDER ARTHROSCOPY WITH SUBACROMIAL DECOMPRESSION AND OPEN ROTATOR C Right 07/19/2020   Procedure: Right shoulder arthroscopic vs mini-open rotator cuff repair vs Regeneten patch application, subacomial dcompression, and biceps tenodesis;  Surgeon: Leim Fabry, MD;  Location: [facility] ORS;  Service: Orthopedics;  Laterality: Right;    Prior to Admission medications   Medication Sig Start Date End Date  Taking? Authorizing Provider  acetaminophen (TYLENOL) 500 MG tablet Take 2 tablets (1,000 mg total) by mouth every 8 (eight) hours. 07/19/20 07/19/21 Yes Leim Fabry, MD  albuterol (VENTOLIN HFA) 108 (90 Base) MCG/ACT inhaler Inhale 2 puffs into the lungs every 6 (six) hours as needed for wheezing or shortness of breath. 10/02/19  Yes Terald Sleeper, PA-C  amitriptyline (ELAVIL) 10 MG tablet Take 10 mg by mouth at bedtime. 08/08/20  Yes [provider]  aspirin-acetaminophen-caffeine (EXCEDRIN MIGRAINE) [phone] MG tablet Take 2 tablets by mouth every 6 (six) hours as needed for headache.   Yes [provider]  atorvastatin (LIPITOR) 20 MG tablet TAKE 1 TABLET BY MOUTH EVERY DAY Patient taking differently: Take 20 mg by mouth daily.  05/16/19  Yes [name], MD  azelastine (ASTELIN) 0.1 % nasal spray Place 2 sprays into both nostrils daily as needed for rhinitis or allergies.  12/22/19  Yes [provider]  azithromycin (ZITHROMAX) 500 MG tablet Take 1 tablet (500 mg total) by mouth daily. 08/25/20  Yes [name], MD  blood glucose meter kit and supplies KIT Dispense based on patient and insurance preference. Use up to four times daily as directed. (FOR ICD-E11.21) 08/22/16  Yes [name], MD  buPROPion (WELLBUTRIN XL) 150  MG 24 hr tablet Take 450 mg by mouth daily.  05/12/18  Yes [provider]  CLINPRO 5000 1.1 % PSTE Place 1 application onto teeth at bedtime.  06/15/20  Yes [provider]  Continuous Blood Gluc Receiver (FREESTYLE LIBRE 14 DAY READER) DEVI 1 applicator by Does not apply route 4 (four) times daily. 11/24/17  Yes [name], MD  Continuous Blood Gluc Sensor (FREESTYLE LIBRE 14 DAY SENSOR) MISC Inject 1 application into the skin 4 (four) times daily as needed (uncontrolled diabetes). 09/19/19  Yes [name], MD  cyanocobalamin (,VITAMIN B-12,) 1000 MCG/ML injection INJECT 1 ML (1,000 MCG TOTAL) INTO THE MUSCLE ONCE A WEEK.  FOR 4 WEEKS, THEN MONTHLY THEREAFTER 05/07/20  Yes [name], MD  cyclobenzaprine (FLEXERIL) 10 MG tablet Take 5-10 mg by mouth 3 (three) times daily as needed for muscle spasms.  05/21/18  Yes [provider]  diphenhydrAMINE (BENADRYL) 25 MG tablet Take 25 mg by mouth every 6 (six) hours as needed for allergies.   Yes [provider]  escitalopram (LEXAPRO) 20 MG tablet Take 20 mg by mouth daily.  02/27/17  Yes [provider]  fluticasone (FLONASE) 50 MCG/ACT nasal spray SPRAY 2 SPRAYS INTO EACH NOSTRIL EVERY DAY Patient taking differently: Place 2 sprays into both nostrils daily.  05/27/20  Yes [name], MD  HORIZANT 600 MG TBCR Take 600 mg by mouth 2 (two) times daily.  12/24/19  Yes [provider]  Insulin Glargine (BASAGLAR KWIKPEN) 100 UNIT/ML SOPN Inject 0.5 mLs (50 Units total) into the skin daily. Patient taking differently: Inject 33 Units into the skin at bedtime.  12/11/19  Yes [name], MD  Insulin Pen Needle (BD PEN NEEDLE NANO U/F) 32G X 4 MM MISC Please specify directions, refills and quantity 08/22/18  Yes [name], MD  lamoTRIgine (LAMICTAL) 150 MG tablet Take 150 mg by mouth daily.   Yes [provider]  Lancet Device MISC Test blood sugar two times daily 07/29/12  Yes [name], MD  levothyroxine (SYNTHROID) 50 MCG tablet Take 1 tablet (50 mcg total) by mouth daily. 01/26/20  Yes [name], MD  metoprolol tartrate (LOPRESSOR) 25 MG tablet Take 1 tablet (25 mg total) by mouth 2 (two) times daily. 06/15/20  Yes [name], MD  NOVOLOG FLEXPEN 100 UNIT/ML FlexPen INJECT 20 UNITS INTO THE SKIN 3 TIMES DAILY WITH MEALS Patient taking differently: Inject 5-20 Units into the skin 3 (three) times daily with meals. Inject 15 units in the morning, 20 units at lunch and 5-10 units with dinner 05/11/20  Yes [name], MD  nystatin (MYCOSTATIN/NYSTOP) powder APPLY 1 APPLICATION TO AFFECTED AREA TWICE A  DAY Patient taking differently: Apply 1 application topically daily as needed (irritation).  06/15/20  Yes [name], MD  OnabotulinumtoxinA (BOTOX IJ) Inject as directed every 3 (three) months. For migraine   Yes [provider]  Rimegepant Sulfate (NURTEC PO) Take by mouth as needed.   Yes [provider]  SYRINGE-NEEDLE, DISP, 3 ML (BD INTEGRA SYRINGE) 25G X 1" 3 ML MISC FOR USE WITH B12 INJECTIONS WEEKLY/MONTHLY 08/01/18  Yes [name], MD  Vitamin D, Ergocalciferol, (DRISDOL) 1.25 MG (50000 UNIT) CAPS capsule TAKE 1 CAPSULE BY MOUTH ONE TIME PER WEEK Patient taking differently: Take 50,000 Units by mouth every 7 (seven) days.  04/12/20  Yes [name], MD  diphenoxylate-atropine (LOMOTIL) 2.5-0.025 MG tablet Take 1  tablet by mouth 4 (four) times daily as needed for diarrhea or loose stools. Patient not taking: Reported on 08/25/2020 08/06/20   [name], MD  EPINEPHrine 0.3 mg/0.3 mL IJ SOAJ injection Inject 0.3 mg into the muscle as needed for anaphylaxis.  Patient not taking: Reported on 08/25/2020 06/25/10   [provider]  furosemide (LASIX) 20 MG tablet TAKE 1 TABLET (20 MG TOTAL) BY MOUTH DAILY. AS NEEDED FOR FLUID RETENTION Patient not taking: Reported on 08/25/2020 08/17/20   [name], MD  loperamide (IMODIUM) 2 MG capsule Take 2 capsules (4 mg total) by mouth as needed for diarrhea or loose stools. Patient not taking: Reported on 08/25/2020 10/25/19   Damita Lack, MD  LORazepam (ATIVAN) 0.5 MG tablet Take 0.5-1 mg by mouth daily as needed for anxiety.  Patient not taking: Reported on 08/25/2020    [provider]  meclizine (ANTIVERT) 25 MG tablet Take 1 tablet (25 mg total) by mouth 3 (three) times daily as needed for dizziness. Patient not taking: Reported on 08/25/2020 06/16/15   [name], MD  Melatonin 10 MG TABS Take 10 mg by mouth at bedtime as needed (sleep). Patient not taking: Reported on 08/25/2020    [provider]  ondansetron (ZOFRAN ODT) 4 MG disintegrating tablet Take 1 tablet (4 mg total) by mouth every 8 (eight) hours as needed for nausea or vomiting. Patient not taking: Reported on 08/25/2020 07/19/20   Leim Fabry, MD  polyethylene glycol-electrolytes (NULYTELY) 420 g solution Prepare according to package instructions. Starting at 5:00 PM: Drink one 8 oz glass of mixture every 15 minutes until you finish half of the jug. Five hours prior to procedure, drink 8 oz glass of mixture every 15 minutes until it is all gone. Make sure you do not drink anything 4 hours prior to your procedure. 08/25/20   Virgel Manifold, MD  predniSONE (DELTASONE) 10 MG tablet 6 tablets on Day 1 , then reduce by 1 tablet daily until gone Patient not taking: Reported on 08/25/2020 08/25/20   [name], MD  promethazine (PHENERGAN) 25 MG tablet Take 25 mg by mouth every 6 (six) hours as needed for nausea or vomiting.  Patient not taking: Reported on 08/25/2020 07/01/20   [provider]    Family History  Problem Relation Age of Onset  . Diabetes Mother   . Hypertension Mother   . Hypertension Father   . Diabetes Father   . Depression Other        strong hx of  . Breast cancer Paternal Grandmother 36  . Breast cancer Maternal Grandmother   . Cancer Neg Hx      Social History   Tobacco Use  . Smoking status: Former Smoker    Packs/day: 1.00    Years: 4.00    Pack years: 4.00    Types: Cigarettes    Quit date: 02/05/1996    Years since quitting: 24.5  . Smokeless tobacco: Never Used  Vaping Use  . Vaping Use: Never used  Substance Use Topics  . Alcohol use: No  . Drug use: No    Allergies as of 08/25/2020 - Review Complete 08/25/2020  Allergen Reaction Noted  . Amoxicillin Hives and Rash 02/05/2012  . Avocado Swelling 05/29/2015  . Clarithromycin Hives and Rash 02/05/2012  . Honey Anaphylaxis 04/02/2012  . Other Hives and Swelling 05/29/2015  . Sulfa drugs cross reactors  Anaphylaxis 02/05/2012  . Watermelon concentrate Hives and Swelling  04/02/2012  . Adhesive [tape] Itching 04/02/2012  . Levemir [insulin detemir] Rash 06/11/2018  . Victoza [liraglutide] Nausea Only 06/11/2018    Review of Systems:    All systems reviewed and negative except where noted in HPI.   Physical Exam:  BP (!) 144/88   Pulse (!) 102   Temp 98 F (36.7 C) (Oral)   Ht 5' 4"  (1.626 m)   Wt 237 lb (107.5 kg)   LMP 05/09/2016 (LMP Unknown) Comment: D/C  BMI 40.68 kg/m  Patient's last menstrual period was 05/09/2016 (lmp unknown). Psych:  Alert and cooperative. Normal mood and affect. General:   Alert,  Well-developed, well-nourished, pleasant and cooperative in NAD Head:  Normocephalic and atraumatic. Eyes:  Sclera clear, no icterus.   Conjunctiva pink. Ears:  Normal auditory acuity. Nose:  No deformity, discharge, or lesions. Mouth:  No deformity or lesions,oropharynx pink & moist. Neck:  Supple; no masses or thyromegaly. Abdomen:  Normal bowel sounds.  No bruits.  Soft, non-tender and non-distended without masses, hepatosplenomegaly or hernias noted.  No guarding or rebound tenderness.    Msk:  Symmetrical without gross deformities. Good, equal movement & strength bilaterally. Pulses:  Normal pulses noted. Extremities:  No clubbing or edema.  No cyanosis. Neurologic:  Alert and oriented x3;  grossly normal neurologically. Skin:  Intact without significant lesions or rashes. No jaundice. Lymph Nodes:  No significant cervical adenopathy. Psych:  Alert and cooperative. Normal mood and affect.   Labs: CBC    Component Value Date/Time   WBC 10.6 (H) 07/22/2020 1613   RBC 3.91 07/22/2020 1613   HGB 11.4 (L) 07/22/2020 1613   HCT 38.3 07/22/2020 1613   PLT 404 (H) 07/22/2020 1613   MCV 98.0 07/22/2020 1613   MCH 29.2 07/22/2020 1613   MCHC 29.8 (L) 07/22/2020 1613   RDW 16.4 (H) 07/22/2020 1613   LYMPHSABS 3.2 07/22/2020 1613   MONOABS 0.7 07/22/2020 1613   EOSABS  0.2 07/22/2020 1613   BASOSABS 0.1 07/22/2020 1613   CMP     Component Value Date/Time   NA 136 07/22/2020 1613   NA 135 06/23/2020 1643   K 4.5 07/22/2020 1613   CL 102 07/22/2020 1613   CO2 24 07/22/2020 1613   GLUCOSE 136 (H) 07/22/2020 1613   BUN 20 07/22/2020 1613   BUN 18 06/23/2020 1643   CREATININE 1.20 (H) 07/22/2020 1613   CREATININE 1.16 (H) 09/19/2019 1646   CALCIUM 8.1 (L) 07/22/2020 1613   PROT 6.1 (L) 07/22/2020 1613   PROT 5.9 (L) 06/23/2020 1643   ALBUMIN 2.7 (L) 07/22/2020 1613   ALBUMIN 3.5 (L) 06/23/2020 1643   AST 20 07/22/2020 1613   ALT 27 07/22/2020 1613   ALKPHOS 123 07/22/2020 1613   BILITOT 0.6 07/22/2020 1613   BILITOT <0.2 06/23/2020 1643   GFRNONAA 53 (L) 07/22/2020 1613   GFRNONAA >60 09/05/2013 0939   GFRNONAA 85 06/07/2012 1202   GFRAA >60 07/22/2020 1613   GFRAA >60 09/05/2013 0939   GFRAA >89 06/07/2012 1202    Imaging Studies: No results found.  Assessment and Plan:   Maria Bentley is a 49 y.o. y/o female has been referred for diarrhea  Recent acute diarrhea from EPEC  After acute infection resolves, proceed with screening colonoscopy and obtain biopsies for microscopic colitis due to chronic loose stools  Obtain celiac disease panel at this time as well  Patient has history of cholecystectomy, and diarrhea did get worse after and never improved.  Try  colestipol  Dr Maria Bentley  Speech recognition software was used to dictate the above note.

## 2020-08-27 LAB — CELIAC DISEASE PANEL
Endomysial IgA: NEGATIVE
IgA/Immunoglobulin A, Serum: 409 mg/dL — ABNORMAL HIGH (ref 87–352)
Transglutaminase IgA: <2 U/mL (ref 0–3)

## 2020-08-29 LAB — STOOL CULTURE: E coli, Shiga toxin Assay: NEGATIVE

## 2020-08-29 LAB — STOOL CULTURE REFLEX - CMPCXR

## 2020-08-29 LAB — STOOL CULTURE REFLEX - RSASHR

## 2020-08-31 ENCOUNTER — Telehealth: Payer: Self-pay | Admitting: Internal Medicine

## 2020-08-31 NOTE — Telephone Encounter (Signed)
lft Pt vm to call ofc to sch SLP barium swallow.

## 2020-09-01 LAB — PANCREATIC ELASTASE, FECAL: Pancreatic Elastase-1, Stool: <50 ug Elast./g — ABNORMAL LOW (ref >200)

## 2020-09-04 ENCOUNTER — Encounter: Payer: Self-pay | Admitting: Internal Medicine

## 2020-09-04 DIAGNOSIS — R197 Diarrhea, unspecified: Secondary | ICD-10-CM | POA: Insufficient documentation

## 2020-09-04 DIAGNOSIS — K8689 Other specified diseases of pancreas: Secondary | ICD-10-CM | POA: Insufficient documentation

## 2020-09-07 ENCOUNTER — Other Ambulatory Visit: Payer: Self-pay

## 2020-09-07 ENCOUNTER — Other Ambulatory Visit: Payer: Self-pay | Admitting: Internal Medicine

## 2020-09-07 MED ORDER — COLESTIPOL HCL 1 G PO TABS: 2.0000 g | ORAL_TABLET | Freq: Two times a day (BID) | ORAL | 1 refills | Status: DC

## 2020-09-08 ENCOUNTER — Other Ambulatory Visit: Payer: Self-pay | Admitting: Family

## 2020-09-15 ENCOUNTER — Other Ambulatory Visit: Payer: Self-pay

## 2020-09-15 ENCOUNTER — Other Ambulatory Visit (INDEPENDENT_AMBULATORY_CARE_PROVIDER_SITE_OTHER): Payer: BC Managed Care – PPO

## 2020-09-15 DIAGNOSIS — E11649 Type 2 diabetes mellitus with hypoglycemia without coma: Secondary | ICD-10-CM | POA: Diagnosis not present

## 2020-09-15 DIAGNOSIS — E033 Postinfectious hypothyroidism: Secondary | ICD-10-CM

## 2020-09-15 LAB — COMPREHENSIVE METABOLIC PANEL
ALT: 19 U/L (ref 0–35)
AST: 12 U/L (ref 0–37)
Albumin: 3.4 g/dL — ABNORMAL LOW (ref 3.5–5.2)
Alkaline Phosphatase: 100 U/L (ref 39–117)
BUN: 18 mg/dL (ref 6–23)
CO2: 30 mEq/L (ref 19–32)
Calcium: 9.2 mg/dL (ref 8.4–10.5)
Chloride: 103 mEq/L (ref 96–112)
Creatinine, Ser: 1.16 mg/dL (ref 0.40–1.20)
GFR: 54.79 mL/min — ABNORMAL LOW (ref >60.00)
Glucose, Bld: 124 mg/dL — ABNORMAL HIGH (ref 70–99)
Potassium: 4.3 mEq/L (ref 3.5–5.1)
Sodium: 139 mEq/L (ref 135–145)
Total Bilirubin: 0.3 mg/dL (ref 0.2–1.2)
Total Protein: 6.1 g/dL (ref 6.0–8.3)

## 2020-09-15 LAB — HEMOGLOBIN A1C: Hgb A1c MFr Bld: 8.9 % — ABNORMAL HIGH (ref 4.6–6.5)

## 2020-09-15 LAB — TSH: TSH: 1.63 u[IU]/mL (ref 0.35–4.50)

## 2020-09-16 ENCOUNTER — Other Ambulatory Visit: Payer: BC Managed Care – PPO

## 2020-09-16 ENCOUNTER — Ambulatory Visit: Payer: BC Managed Care – PPO | Admitting: Cardiovascular Disease

## 2020-09-16 NOTE — Progress Notes (Signed)
Your protein stores and renal function have improved,  but your a1c is very high again. Please schedule a visit to review your glycemic control.

## 2020-09-17 ENCOUNTER — Encounter: Payer: Self-pay | Admitting: Internal Medicine

## 2020-09-20 ENCOUNTER — Ambulatory Visit: Payer: BC Managed Care – PPO | Admitting: Family

## 2020-09-22 ENCOUNTER — Telehealth: Payer: Self-pay | Admitting: Internal Medicine

## 2020-09-22 NOTE — Telephone Encounter (Signed)
My Chart message sent

## 2020-09-27 ENCOUNTER — Other Ambulatory Visit: Payer: Self-pay | Admitting: Internal Medicine

## 2020-09-29 ENCOUNTER — Other Ambulatory Visit: Payer: Self-pay

## 2020-09-30 ENCOUNTER — Other Ambulatory Visit: Payer: Self-pay | Admitting: Internal Medicine

## 2020-10-06 LAB — HM DIABETES EYE EXAM

## 2020-10-07 ENCOUNTER — Ambulatory Visit (INDEPENDENT_AMBULATORY_CARE_PROVIDER_SITE_OTHER): Payer: BC Managed Care – PPO | Admitting: Gastroenterology

## 2020-10-07 ENCOUNTER — Other Ambulatory Visit: Payer: Self-pay

## 2020-10-07 ENCOUNTER — Encounter: Payer: Self-pay | Admitting: Gastroenterology

## 2020-10-07 VITALS — BP 141/85 | HR 99 | Temp 99.0°F | Ht 64.0 in | Wt 247.0 lb

## 2020-10-07 DIAGNOSIS — R197 Diarrhea, unspecified: Secondary | ICD-10-CM

## 2020-10-07 MED ORDER — PANCRELIPASE (LIP-PROT-AMYL) 36000-114000 UNITS PO CPEP: ORAL_CAPSULE | ORAL | 2 refills | Status: AC

## 2020-10-07 NOTE — Patient Instructions (Signed)
Please discontinue taking Colestipol and start taking Creon.

## 2020-10-08 NOTE — Progress Notes (Signed)
Maria Antigua, MD [address]  [place]  [place], [place] [address]  Main: [phone]  Fax: [phone]   Primary Care Physician: [name], MD   Chief Complaint  Patient presents with   Diarrhea    HPI: Maria Bentley is a 49 y.o. female here for follow-up of diarrhea.  Patient has been taking Colestid which she states has made a difference in her diarrhea.  However, since her last visit she was diagnosed with pancreatic insufficiency based on very low fecal pancreatic elastase.  She is not on pancreatic replacement enzymes.  She was started on Colestid previously due to her history of cholecystectomy.  However, today she states that her diarrhea did not start for 1 year after her cholecystectomy.  She has history of gastric bypass as well.  Current Outpatient Medications  Medication Sig Dispense Refill   acetaminophen (TYLENOL) 500 MG tablet Take 2 tablets (1,000 mg total) by mouth every 8 (eight) hours. 90 tablet 2   albuterol (VENTOLIN HFA) 108 (90 Base) MCG/ACT inhaler Inhale 2 puffs into the lungs every 6 (six) hours as needed for wheezing or shortness of breath. 18 g 0   amitriptyline (ELAVIL) 25 MG tablet Take 1 tablet by mouth at bedtime.     aspirin-acetaminophen-caffeine (EXCEDRIN MIGRAINE) 250-250-65 MG tablet Take 2 tablets by mouth every 6 (six) hours as needed for headache.     atorvastatin (LIPITOR) 20 MG tablet Take 1 tablet (20 mg total) by mouth daily. 90 tablet 1   azelastine (ASTELIN) 0.1 % nasal spray Place 2 sprays into both nostrils daily as needed for rhinitis or allergies.      azithromycin (ZITHROMAX) 500 MG tablet Take 1 tablet (500 mg total) by mouth daily. 3 tablet 0   blood glucose meter kit and supplies KIT Dispense based on patient and insurance preference. Use up to four times daily as directed. (FOR ICD-E11.21) 1 each 0   buPROPion (WELLBUTRIN XL) 150 MG 24 hr tablet Take 450 mg by mouth daily.   3   CLINPRO  5000 1.1 % PSTE Place 1 application onto teeth at bedtime.      Continuous Blood Gluc Receiver (FREESTYLE LIBRE 14 DAY READER) DEVI 1 applicator by Does not apply route 4 (four) times daily. 1 Device 1   Continuous Blood Gluc Sensor (FREESTYLE LIBRE 14 DAY SENSOR) MISC USE TO CHECK BLOOD SUGAR FOUR TIMES A DAY AS NEEDED 2 each 11   cyanocobalamin (,VITAMIN B-12,) 1000 MCG/ML injection INJECT 1 ML (1,000 MCG TOTAL) INTO THE MUSCLE ONCE A WEEK. FOR 4 WEEKS, THEN MONTHLY THEREAFTER 6 mL 4   cyclobenzaprine (FLEXERIL) 10 MG tablet Take 5-10 mg by mouth 3 (three) times daily as needed for muscle spasms.   12   diphenhydrAMINE (BENADRYL) 25 MG tablet Take 25 mg by mouth every 6 (six) hours as needed for allergies.     diphenoxylate-atropine (LOMOTIL) 2.5-0.025 MG tablet Take 1 tablet by mouth 4 (four) times daily as needed for diarrhea or loose stools. 30 tablet 1   EPINEPHrine 0.3 mg/0.3 mL IJ SOAJ injection Inject 0.3 mg into the muscle as needed for anaphylaxis.      escitalopram (LEXAPRO) 20 MG tablet Take 20 mg by mouth daily.      fluticasone (FLONASE) 50 MCG/ACT nasal spray SPRAY 2 SPRAYS INTO EACH NOSTRIL EVERY DAY (Patient taking differently: Place 2 sprays into both nostrils daily. ) 48 mL 1   furosemide (LASIX) 20 MG tablet TAKE 1  TABLET (20 MG TOTAL) BY MOUTH DAILY. AS NEEDED FOR FLUID RETENTION 90 tablet 1   HORIZANT 600 MG TBCR Take 600 mg by mouth 2 (two) times daily.      Insulin Glargine (BASAGLAR KWIKPEN) 100 UNIT/ML SOPN Inject 0.5 mLs (50 Units total) into the skin daily. (Patient taking differently: Inject 33 Units into the skin at bedtime. ) 45 mL 3   Insulin Pen Needle (BD PEN NEEDLE NANO U/F) 32G X 4 MM MISC Please specify directions, refills and quantity 100 each 11   lamoTRIgine (LAMICTAL) 150 MG tablet Take 150 mg by mouth daily.     Lancet Device MISC Test blood sugar two times daily 100 each 6   levothyroxine (SYNTHROID) 50 MCG tablet Take 1 tablet (50 mcg  total) by mouth daily. 90 tablet 3   loperamide (IMODIUM) 2 MG capsule Take 2 capsules (4 mg total) by mouth as needed for diarrhea or loose stools. 30 capsule 0   LORazepam (ATIVAN) 0.5 MG tablet Take 0.5-1 mg by mouth daily as needed for anxiety.      losartan (COZAAR) 25 MG tablet Take 1 tablet by mouth daily.     meclizine (ANTIVERT) 25 MG tablet Take 1 tablet (25 mg total) by mouth 3 (three) times daily as needed for dizziness. 90 tablet 6   Melatonin 10 MG TABS Take 10 mg by mouth at bedtime as needed (sleep).      metoprolol tartrate (LOPRESSOR) 25 MG tablet Take 1 tablet (25 mg total) by mouth 2 (two) times daily. 180 tablet 3   NOVOLOG FLEXPEN 100 UNIT/ML FlexPen INJECT 20 UNITS INTO THE SKIN 3 TIMES DAILY WITH MEALS (Patient taking differently: Inject 5-20 Units into the skin 3 (three) times daily with meals. Inject 15 units in the morning, 20 units at lunch and 5-10 units with dinner) 15 mL 2   NURTEC 75 MG TBDP Take 1 tablet by mouth daily.     nystatin (MYCOSTATIN/NYSTOP) powder APPLY 1 APPLICATION TO AFFECTED AREA TWICE A DAY (Patient taking differently: Apply 1 application topically daily as needed (irritation). ) 60 g 0   OnabotulinumtoxinA (BOTOX IJ) Inject as directed every 3 (three) months. For migraine     ondansetron (ZOFRAN ODT) 4 MG disintegrating tablet Take 1 tablet (4 mg total) by mouth every 8 (eight) hours as needed for nausea or vomiting. 20 tablet 0   polyethylene glycol-electrolytes (NULYTELY) 420 g solution Prepare according to package instructions. Starting at 5:00 PM: Drink one 8 oz glass of mixture every 15 minutes until you finish half of the jug. Five hours prior to procedure, drink 8 oz glass of mixture every 15 minutes until it is all gone. Make sure you do not drink anything 4 hours prior to your procedure. 4000 mL 0   predniSONE (DELTASONE) 10 MG tablet 6 tablets on Day 1 , then reduce by 1 tablet daily until gone 21 tablet 0   promethazine  (PHENERGAN) 25 MG tablet Take 25 mg by mouth every 6 (six) hours as needed for nausea or vomiting.      Rimegepant Sulfate (NURTEC PO) Take by mouth as needed.     SYRINGE-NEEDLE, DISP, 3 ML (BD INTEGRA SYRINGE) 25G X 1" 3 ML MISC FOR USE WITH B12 INJECTIONS WEEKLY/MONTHLY 25 each 0   Vitamin D, Ergocalciferol, (DRISDOL) 1.25 MG (50000 UNIT) CAPS capsule TAKE 1 CAPSULE BY MOUTH ONE TIME PER WEEK (Patient taking differently: Take 50,000 Units by mouth every 7 (seven) days. ) 12 capsule  1   lipase/protease/amylase (CREON) 36000 UNITS CPEP capsule Take 2 capsules (72,000 Units total) by mouth 3 (three) times daily before meals AND 1 capsule (36,000 Units total) with snacks. 270 capsule 2   No current facility-administered medications for this visit.    Allergies as of 10/07/2020 - Review Complete 10/07/2020  Allergen Reaction Noted   Amoxicillin Hives, Rash, Itching, and Swelling 02/05/2012   Avocado Swelling 05/29/2015   Clarithromycin Hives and Rash 02/05/2012   Clarithromycin Anaphylaxis, Hives, and Rash 02/05/2012   Honey Anaphylaxis 04/02/2012   Other Hives and Swelling 05/29/2015   Sulfa antibiotics Anaphylaxis 02/05/2012   Sulfa drugs cross reactors Anaphylaxis 02/05/2012   Watermelon concentrate Hives and Swelling 04/02/2012   Insulin detemir Rash 06/11/2018   Tape Itching and Rash 04/02/2012   Victoza [liraglutide] Nausea Only 06/11/2018    ROS:  General: Negative for anorexia, weight loss, fever, chills, fatigue, weakness. ENT: Negative for hoarseness, difficulty swallowing , nasal congestion. CV: Negative for chest pain, angina, palpitations, dyspnea on exertion, peripheral edema.  Respiratory: Negative for dyspnea at rest, dyspnea on exertion, cough, sputum, wheezing.  GI: See history of present illness. GU:  Negative for dysuria, hematuria, urinary incontinence, urinary frequency, nocturnal urination.  Endo: Negative for unusual weight change.    Physical  Examination:   BP (!) 141/85    Pulse 99    Temp 99 F (37.2 C) (Oral)    Ht $R'5\' 4"'SE$  (1.626 m)    Wt 247 lb (112 kg)    LMP 05/09/2016 (LMP Unknown) Comment: D/C   BMI 42.40 kg/m   General: Well-nourished, well-developed in no acute distress.  Eyes: No icterus. Conjunctivae pink. Mouth: Oropharyngeal mucosa moist and pink , no lesions erythema or exudate. Neck: Supple, Trachea midline Abdomen: Bowel sounds are normal, nontender, nondistended, no hepatosplenomegaly or masses, no abdominal bruits or hernia , no rebound or guarding.   Extremities: No lower extremity edema. No clubbing or deformities. Neuro: Alert and oriented x 3.  Grossly intact. Skin: Warm and dry, no jaundice.   Psych: Alert and cooperative, normal mood and affect.   Labs: CMP     Component Value Date/Time   NA 139 09/15/2020 0932   NA 135 06/23/2020 1643   K 4.3 09/15/2020 0932   CL 103 09/15/2020 0932   CO2 30 09/15/2020 0932   GLUCOSE 124 (H) 09/15/2020 0932   BUN 18 09/15/2020 0932   BUN 18 06/23/2020 1643   CREATININE 1.16 09/15/2020 0932   CREATININE 1.16 (H) 09/19/2019 1646   CALCIUM 9.2 09/15/2020 0932   PROT 6.1 09/15/2020 0932   PROT 5.9 (L) 06/23/2020 1643   ALBUMIN 3.4 (L) 09/15/2020 0932   ALBUMIN 3.5 (L) 06/23/2020 1643   AST 12 09/15/2020 0932   ALT 19 09/15/2020 0932   ALKPHOS 100 09/15/2020 0932   BILITOT 0.3 09/15/2020 0932   BILITOT <0.2 06/23/2020 1643   GFRNONAA 53 (L) 07/22/2020 1613   GFRNONAA >60 09/05/2013 0939   GFRNONAA 85 06/07/2012 1202   GFRAA >60 07/22/2020 1613   GFRAA >60 09/05/2013 0939   GFRAA >89 06/07/2012 1202   Lab Results  Component Value Date   WBC 10.6 (H) 07/22/2020   HGB 11.4 (L) 07/22/2020   HCT 38.3 07/22/2020   MCV 98.0 07/22/2020   PLT 404 (H) 07/22/2020    Imaging Studies: No results found.  Assessment and Plan:   Maria Bentley is a 49 y.o. y/o female here for follow-up of diarrhea  Diarrhea has improved  with Colestid However,  given that she has significant pancreatic insufficiency, and her diarrhea actually did not start till a year after her cholecystectomy, her diarrhea is most likely due to EPI from her gastric bypass anatomy and not due to postcholecystectomy diarrhea itself  I will discontinue Colestid Start pancreatic replacement enzymes instead  She is also due for screening colonoscopy.  Obtain biopsies for microscopic colitis during her procedure  I have discussed alternative options, risks & benefits,  which include, but are not limited to, bleeding, infection, perforation,respiratory complication & drug reaction.  The patient agrees with this plan & written consent will be obtained.       Dr Maria Bentley

## 2020-10-19 ENCOUNTER — Other Ambulatory Visit
Admission: RE | Admit: 2020-10-19 | Discharge: 2020-10-19 | Disposition: A | Discharge status: Home / Self Care | Payer: BC Managed Care – PPO | Source: Ambulatory Visit | Attending: Gastroenterology | Admitting: Gastroenterology

## 2020-10-19 ENCOUNTER — Telehealth: Payer: Self-pay

## 2020-10-19 ENCOUNTER — Other Ambulatory Visit: Payer: Self-pay

## 2020-10-19 ENCOUNTER — Other Ambulatory Visit: Payer: BC Managed Care – PPO

## 2020-10-19 DIAGNOSIS — Z79899 Other long term (current) drug therapy: Secondary | ICD-10-CM | POA: Diagnosis not present

## 2020-10-19 DIAGNOSIS — Z8616 Personal history of COVID-19: Secondary | ICD-10-CM | POA: Diagnosis not present

## 2020-10-19 DIAGNOSIS — Z01812 Encounter for preprocedural laboratory examination: Secondary | ICD-10-CM | POA: Insufficient documentation

## 2020-10-19 DIAGNOSIS — Z794 Long term (current) use of insulin: Secondary | ICD-10-CM | POA: Diagnosis not present

## 2020-10-19 DIAGNOSIS — Z20822 Contact with and (suspected) exposure to covid-19: Secondary | ICD-10-CM | POA: Insufficient documentation

## 2020-10-19 DIAGNOSIS — Z1211 Encounter for screening for malignant neoplasm of colon: Secondary | ICD-10-CM | POA: Diagnosis not present

## 2020-10-19 DIAGNOSIS — Z79890 Hormone replacement therapy (postmenopausal): Secondary | ICD-10-CM | POA: Diagnosis not present

## 2020-10-19 LAB — SARS CORONAVIRUS 2 (TAT 6-24 HRS): SARS Coronavirus 2: NEGATIVE

## 2020-10-19 NOTE — Telephone Encounter (Signed)
Returned patients call in regards to colonoscopy instructions that was lost.  Patient has been advised that she can view her instructions in mychart under the letters tab and the rx for her prep was sent to the pharmacy.  Patient states that she did pick up the prep and thought it was for something else.  Patient has been instructed that she will need to mix the prep with a clear liquid of her choice and drink 8 oz every 30 minutes starting at 5pm the evening before her procedure.  Thanks,  [place], [place]

## 2020-10-21 ENCOUNTER — Ambulatory Visit: Payer: BC Managed Care – PPO | Admitting: Registered Nurse

## 2020-10-21 ENCOUNTER — Encounter: Payer: Self-pay | Admitting: Gastroenterology

## 2020-10-21 ENCOUNTER — Other Ambulatory Visit: Payer: Self-pay

## 2020-10-21 ENCOUNTER — Ambulatory Visit
Admission: RE | Admit: 2020-10-21 | Discharge: 2020-10-21 | Disposition: A | Discharge status: Home / Self Care | Payer: BC Managed Care – PPO | Attending: Gastroenterology | Admitting: Gastroenterology

## 2020-10-21 ENCOUNTER — Encounter
Admission: RE | Disposition: A | Discharge status: Home / Self Care | Payer: Self-pay | Source: Home / Self Care | Attending: Gastroenterology

## 2020-10-21 DIAGNOSIS — Z1211 Encounter for screening for malignant neoplasm of colon: Secondary | ICD-10-CM

## 2020-10-21 DIAGNOSIS — Z8616 Personal history of COVID-19: Secondary | ICD-10-CM | POA: Insufficient documentation

## 2020-10-21 DIAGNOSIS — Z794 Long term (current) use of insulin: Secondary | ICD-10-CM | POA: Insufficient documentation

## 2020-10-21 DIAGNOSIS — Z79890 Hormone replacement therapy (postmenopausal): Secondary | ICD-10-CM | POA: Insufficient documentation

## 2020-10-21 DIAGNOSIS — Z79899 Other long term (current) drug therapy: Secondary | ICD-10-CM | POA: Insufficient documentation

## 2020-10-21 HISTORY — PX: COLONOSCOPY WITH PROPOFOL: SHX5780

## 2020-10-21 HISTORY — DX: Cardiac arrhythmia, unspecified: I49.9

## 2020-10-21 LAB — GLUCOSE, CAPILLARY: Glucose-Capillary: 126 mg/dL — ABNORMAL HIGH (ref 70–99)

## 2020-10-21 LAB — POCT PREGNANCY, URINE: Preg Test, Ur: NEGATIVE

## 2020-10-21 SURGERY — COLONOSCOPY WITH PROPOFOL
Anesthesia: General

## 2020-10-21 MED ORDER — PROPOFOL 500 MG/50ML IV EMUL
INTRAVENOUS | Status: AC
  Filled 2020-10-21: qty 50

## 2020-10-21 MED ORDER — PROPOFOL 10 MG/ML IV BOLUS
INTRAVENOUS | Status: DC | PRN
  Administered 2020-10-21: 80 mg via INTRAVENOUS

## 2020-10-21 MED ORDER — PROPOFOL 500 MG/50ML IV EMUL
INTRAVENOUS | Status: DC | PRN
  Administered 2020-10-21: 140 ug/kg/min via INTRAVENOUS

## 2020-10-21 MED ORDER — SODIUM CHLORIDE 0.9 % IV SOLN: INTRAVENOUS | Status: DC

## 2020-10-21 NOTE — Op Note (Signed)
[facility] Gastroenterology Patient Name: Maria Bentley Procedure Date: 10/21/2020 9:09 AM MRN: [id number] Account #: [id number] Date of Birth: 1971/02/20 Admit Type: Outpatient Age: 49 Room: [facility] ENDO ROOM 2 Gender: Female Note Status: Finalized Procedure:             Colonoscopy Indications:           Screening for colorectal malignant neoplasm Providers:             Bakari Nikolai B. Bonna Gains MD, MD Medicines:             Monitored Anesthesia Care Complications:         No immediate complications. Procedure:             Pre-Anesthesia Assessment:                        - Prior to the procedure, a History and Physical was                         performed, and patient medications, allergies and                         sensitivities were reviewed. The patient's tolerance                         of previous anesthesia was reviewed.                        - The risks and benefits of the procedure and the                         sedation options and risks were discussed with the                         patient. All questions were answered and informed                         consent was obtained.                        - Patient identification and proposed procedure were                         verified prior to the procedure by the physician, the                         nurse, the anesthesiologist, the anesthetist and the                         technician. The procedure was verified in the                         pre-procedure area in the procedure room in the                         endoscopy suite.                        - Prophylactic Antibiotics: The patient does not  require prophylactic antibiotics.                        - ASA Grade Assessment: II - A patient with mild                         systemic disease.                        - After reviewing the risks and benefits, the patient                         was deemed in satisfactory  condition to undergo the                         procedure.                        - Monitored anesthesia care was determined to be                         medically necessary for this procedure based on review                         of the patient's medical history, medications, and                         prior anesthesia history.                        - The anesthesia plan was to use monitored anesthesia                         care (MAC).                        After obtaining informed consent, the colonoscope was                         passed under direct vision. Throughout the procedure,                         the patient's blood pressure, pulse, and oxygen                         saturations were monitored continuously. The                         Colonoscope was introduced through the anus with the                         intention of advancing to the cecum. The scope was                         advanced to the sigmoid colon before the procedure was                         aborted. Medications were given. The colonoscopy was  performed with ease. The patient tolerated the                         procedure well. The quality of the bowel preparation                         was poor. Findings:      The perianal and digital rectal examinations were normal.      A large amount of stool was found in the rectum and in the sigmoid       colon, precluding visualization.      The retroflexed view of the distal rectum and anal verge was normal and       showed no anal or rectal abnormalities. Impression:            - Preparation of the colon was poor.                        - Stool in the rectum and in the sigmoid colon.                        - The distal rectum and anal verge are normal on                         retroflexion view.                        - No specimens collected. Recommendation:        - Repeat colonoscopy within 3 months, with 2 day prep                          (pt would like lower volume prep next time) for                         screening purposes.                        - Continue present medications.                        - Return to my office as previously scheduled.                        - The findings and recommendations were discussed with                         the patient.                        - The findings and recommendations were discussed with                         the patient's family. Procedure Code(s):     --- Professional ---                        [phone], 53, Colonoscopy, flexible; diagnostic,                         including collection of specimen(s) by brushing or  washing, when performed (separate procedure) Diagnosis Code(s):     --- Professional ---                        Z12.11, Encounter for screening for malignant neoplasm                         of colon CPT copyright 2019 American [employer] Association. All rights reserved. The codes documented in this report are preliminary and upon coder review may  be revised to meet current compliance requirements.  [name], MD Margretta Sidle B. Bonna Gains MD, MD 10/21/2020 10:12:04 AM This report has been signed electronically. Number of Addenda: 0 Note Initiated On: 10/21/2020 9:09 AM Total Procedure Duration: 0 hours 3 minutes 39 seconds  Estimated Blood Loss:  Estimated blood loss: none.      [facility]

## 2020-10-21 NOTE — Anesthesia Postprocedure Evaluation (Signed)
Anesthesia Post Note  Patient: Maria Bentley  Procedure(s) Performed: COLONOSCOPY WITH PROPOFOL (N/A )  Patient location during evaluation: Endoscopy Anesthesia Type: General Level of consciousness: awake Pain management: pain level controlled Vital Signs Assessment: post-procedure vital signs reviewed and stable Respiratory status: spontaneous breathing Cardiovascular status: blood pressure returned to baseline and stable Postop Assessment: no headache and no apparent nausea or vomiting Anesthetic complications: no   No complications documented.   Last Vitals:  Vitals:   10/21/20 1030 10/21/20 1040  BP: 126/72 126/72  Pulse: 88   Resp: 20 (!) 24  Temp:    SpO2: 95% 95%    Last Pain:  Vitals:   10/21/20 1010  TempSrc: Temporal  PainSc:                  Neva Seat

## 2020-10-21 NOTE — Anesthesia Preprocedure Evaluation (Signed)
Anesthesia Evaluation  Patient identified by MRN, date of birth, ID band Patient awake    Reviewed: Allergy & Precautions, NPO status , Patient's Chart, lab work & pertinent test results  Airway Mallampati: III   Neck ROM: Full    Dental  (+)    Pulmonary sleep apnea , former smoker,    Pulmonary exam normal        Cardiovascular hypertension, + CAD  Normal cardiovascular exam Rhythm:Regular Rate:Normal     Neuro/Psych  Headaches, PSYCHIATRIC DISORDERS Anxiety Depression  Neuromuscular disease    GI/Hepatic negative GI ROS, Neg liver ROS,   Endo/Other  diabetesHypothyroidism   Renal/GU negative Renal ROS  negative genitourinary   Musculoskeletal  (+) Arthritis , Fibromyalgia -  Abdominal   Peds negative pediatric ROS (+)  Hematology negative hematology ROS (+)   Anesthesia Other Findings .Marland KitchenPast Medical History: 04/30/2019: Abscess of right buttock No date: Anemia No date: Anxiety No date: COVID-19 virus infection 2010: Depression No date: Diabetes mellitus 10/13/2019: DKA, type 2 ([place]) No date: Endometriosis No date: Headache, common migraine, intractable No date: History of kidney stones No date: History of ovarian cyst     Comment:  left sided, caused by her endometriosis (possibly an               endometrioma), in her 45s. No date: Hyperlipidemia No date: Hypertension No date: Kidney stones     Comment:  left No date: Morbid obesity ([place])     Comment:  s/p gastric bypass 2010: Personal hx of gastric bypass No date: Rosacea No date: Vertigo   Reproductive/Obstetrics negative OB ROS                             Anesthesia Physical Anesthesia Plan  ASA: III  Anesthesia Plan: General   Post-op Pain Management:    Induction: Intravenous  PONV Risk Score and Plan: 3 and Propofol infusion  Airway Management Planned: Nasal Cannula  Additional Equipment:  None  Intra-op Plan:   Post-operative Plan:   Informed Consent: I have reviewed the patients History and Physical, chart, labs and discussed the procedure including the risks, benefits and alternatives for the proposed anesthesia with the patient or authorized representative who has indicated his/her understanding and acceptance.       Plan Discussed with: CRNA, Anesthesiologist and Surgeon  Anesthesia Plan Comments:         Anesthesia Quick Evaluation

## 2020-10-21 NOTE — Transfer of Care (Signed)
Immediate Anesthesia Transfer of Care Note  Patient: Maria Bentley  Procedure(s) Performed: COLONOSCOPY WITH PROPOFOL (N/A )  Patient Location: PACU  Anesthesia Type:General  Level of Consciousness: sedated  Airway & Oxygen Therapy: Patient Spontanous Breathing  Post-op Assessment: Report given to RN and Post -op Vital signs reviewed and stable  Post vital signs: Reviewed and stable  Last Vitals:  Vitals Value Taken Time  BP 102/60 10/21/20 1012  Temp    Pulse 94 10/21/20 1012  Resp 12 10/21/20 1012  SpO2 91 % 10/21/20 1012  Vitals shown include unvalidated device data.  Last Pain:  Vitals:   10/21/20 0943  TempSrc: Temporal  PainSc: 0-No pain         Complications: No complications documented.

## 2020-10-21 NOTE — H&P (Signed)
Maria Antigua, MD [address] [place], [place], [place], [address] [address], [place], [place], [place], [address] Phone: [phone]  Fax: [phone]  Primary Care Physician:  [name], MD   Pre-Procedure History & Physical: HPI:  Maria Bentley is a 49 y.o. female is here for a colonoscopy.   Past Medical History:  Diagnosis Date  . Abscess of right buttock 04/30/2019  . Anemia   . Anxiety   . COVID-19 virus infection   . Depression 2010  . Diabetes mellitus   . DKA, type 2 ([place]) 10/13/2019  . Endometriosis   . Headache, common migraine, intractable   . History of kidney stones   . History of ovarian cyst    left sided, caused by her endometriosis (possibly an endometrioma), in her 40s.  Marland Kitchen Hyperlipidemia   . Hypertension   . Kidney stones    left  . Morbid obesity ([place])    s/p gastric bypass  . Personal hx of gastric bypass 2010  . Rosacea   . Vertigo     Past Surgical History:  Procedure Laterality Date  . DILATION AND CURETTAGE OF UTERUS  2011  . gastric bypass    . OVARIAN CYST REMOVAL    . ROTATOR CUFF REPAIR     right  . SHOULDER ARTHROSCOPY WITH SUBACROMIAL DECOMPRESSION AND OPEN ROTATOR C Right 07/19/2020   Procedure: Right shoulder arthroscopic vs mini-open rotator cuff repair vs Regeneten patch application, subacomial dcompression, and biceps tenodesis;  Surgeon: Leim Fabry, MD;  Location: [facility] ORS;  Service: Orthopedics;  Laterality: Right;    Prior to Admission medications   Medication Sig Start Date End Date Taking? Authorizing Provider  acetaminophen (TYLENOL) 500 MG tablet Take 2 tablets (1,000 mg total) by mouth every 8 (eight) hours. 07/19/20 07/19/21  Leim Fabry, MD  albuterol (VENTOLIN HFA) 108 (90 Base) MCG/ACT inhaler Inhale 2 puffs into the lungs every 6 (six) hours as needed for wheezing or shortness of breath. 10/02/19   Terald Sleeper, PA-C  amitriptyline (ELAVIL) 25 MG tablet Take 1 tablet by mouth at bedtime.  08/08/20   [provider]  aspirin-acetaminophen-caffeine (EXCEDRIN MIGRAINE) [phone] MG tablet Take 2 tablets by mouth every 6 (six) hours as needed for headache.    [provider]  atorvastatin (LIPITOR) 20 MG tablet Take 1 tablet (20 mg total) by mouth daily. 09/07/20   [name], MD  azelastine (ASTELIN) 0.1 % nasal spray Place 2 sprays into both nostrils daily as needed for rhinitis or allergies.  12/22/19   [provider]  azithromycin (ZITHROMAX) 500 MG tablet Take 1 tablet (500 mg total) by mouth daily. 08/25/20   [name], MD  blood glucose meter kit and supplies KIT Dispense based on patient and insurance preference. Use up to four times daily as directed. (FOR ICD-E11.21) 08/22/16   [name], MD  buPROPion (WELLBUTRIN XL) 150 MG 24 hr tablet Take 450 mg by mouth daily.  05/12/18   [provider]  CLINPRO 5000 1.1 % PSTE Place 1 application onto teeth at bedtime.  06/15/20   [provider]  Continuous Blood Gluc Receiver (FREESTYLE LIBRE 14 DAY READER) DEVI 1 applicator by Does not apply route 4 (four) times daily. 11/24/17   [name], MD  Continuous Blood Gluc Sensor (FREESTYLE LIBRE 14 DAY SENSOR) MISC USE TO CHECK BLOOD SUGAR FOUR TIMES A DAY AS NEEDED 09/28/20   [name], MD  cyanocobalamin (,VITAMIN  B-12,) 1000 MCG/ML injection INJECT 1 ML (1,000 MCG TOTAL) INTO THE MUSCLE ONCE A WEEK. FOR 4 WEEKS, THEN MONTHLY THEREAFTER 05/07/20   [name], MD  cyclobenzaprine (FLEXERIL) 10 MG tablet Take 5-10 mg by mouth 3 (three) times daily as needed for muscle spasms.  05/21/18   [provider]  diphenhydrAMINE (BENADRYL) 25 MG tablet Take 25 mg by mouth every 6 (six) hours as needed for allergies.    [provider]  diphenoxylate-atropine (LOMOTIL) 2.5-0.025 MG tablet Take 1 tablet by mouth 4 (four) times daily as needed for diarrhea or loose stools. 08/06/20   [name], MD  EPINEPHrine 0.3  mg/0.3 mL IJ SOAJ injection Inject 0.3 mg into the muscle as needed for anaphylaxis.  06/25/10   [provider]  escitalopram (LEXAPRO) 20 MG tablet Take 20 mg by mouth daily.  02/27/17   [provider]  fluticasone (FLONASE) 50 MCG/ACT nasal spray SPRAY 2 SPRAYS INTO EACH NOSTRIL EVERY DAY Patient taking differently: Place 2 sprays into both nostrils daily.  05/27/20   [name], MD  furosemide (LASIX) 20 MG tablet TAKE 1 TABLET (20 MG TOTAL) BY MOUTH DAILY. AS NEEDED FOR FLUID RETENTION 08/17/20   [name], MD  HORIZANT 600 MG TBCR Take 600 mg by mouth 2 (two) times daily.  12/24/19   [provider]  Insulin Glargine (BASAGLAR KWIKPEN) 100 UNIT/ML SOPN Inject 0.5 mLs (50 Units total) into the skin daily. Patient taking differently: Inject 33 Units into the skin at bedtime.  12/11/19   [name], MD  Insulin Pen Needle (BD PEN NEEDLE NANO U/F) 32G X 4 MM MISC Please specify directions, refills and quantity 08/22/18   [name], MD  lamoTRIgine (LAMICTAL) 150 MG tablet Take 150 mg by mouth daily.    [provider]  Lancet Device MISC Test blood sugar two times daily 07/29/12   [name], MD  levothyroxine (SYNTHROID) 50 MCG tablet Take 1 tablet (50 mcg total) by mouth daily. 01/26/20   [name], MD  lipase/protease/amylase (CREON) 36000 UNITS CPEP capsule Take 2 capsules (72,000 Units total) by mouth 3 (three) times daily before meals AND 1 capsule (36,000 Units total) with snacks. 10/07/20 12/07/20  Virgel Manifold, MD  loperamide (IMODIUM) 2 MG capsule Take 2 capsules (4 mg total) by mouth as needed for diarrhea or loose stools. 10/25/19   Amin, Jeanella Flattery, MD  LORazepam (ATIVAN) 0.5 MG tablet Take 0.5-1 mg by mouth daily as needed for anxiety.     [provider]  losartan (COZAAR) 25 MG tablet Take 1 tablet by mouth daily. 09/28/20 09/28/21  [provider]  meclizine (ANTIVERT) 25 MG tablet Take 1  tablet (25 mg total) by mouth 3 (three) times daily as needed for dizziness. 06/16/15   [name], MD  Melatonin 10 MG TABS Take 10 mg by mouth at bedtime as needed (sleep).     [provider]  metoprolol tartrate (LOPRESSOR) 25 MG tablet Take 1 tablet (25 mg total) by mouth 2 (two) times daily. 06/15/20   [name], MD  NOVOLOG FLEXPEN 100 UNIT/ML FlexPen INJECT 20 UNITS INTO THE SKIN 3 TIMES DAILY WITH MEALS Patient taking differently: Inject 5-20 Units into the skin 3 (three) times daily with meals. Inject 15 units in the morning, 20 units at lunch and 5-10 units with dinner 05/11/20   [name], MD  NURTEC 75 MG TBDP Take  1 tablet by mouth daily. 10/06/20   [provider]  nystatin (MYCOSTATIN/NYSTOP) powder APPLY 1 APPLICATION TO AFFECTED AREA TWICE A DAY Patient taking differently: Apply 1 application topically daily as needed (irritation).  06/15/20   [name], MD  OnabotulinumtoxinA (BOTOX IJ) Inject as directed every 3 (three) months. For migraine    [provider]  ondansetron (ZOFRAN ODT) 4 MG disintegrating tablet Take 1 tablet (4 mg total) by mouth every 8 (eight) hours as needed for nausea or vomiting. 07/19/20   Leim Fabry, MD  polyethylene glycol-electrolytes (NULYTELY) 420 g solution Prepare according to package instructions. Starting at 5:00 PM: Drink one 8 oz glass of mixture every 15 minutes until you finish half of the jug. Five hours prior to procedure, drink 8 oz glass of mixture every 15 minutes until it is all gone. Make sure you do not drink anything 4 hours prior to your procedure. 08/25/20   Virgel Manifold, MD  predniSONE (DELTASONE) 10 MG tablet 6 tablets on Day 1 , then reduce by 1 tablet daily until gone 08/25/20   [name], MD  promethazine (PHENERGAN) 25 MG tablet Take 25 mg by mouth every 6 (six) hours as needed for nausea or vomiting.  07/01/20   [provider]  Rimegepant Sulfate (NURTEC PO) Take by  mouth as needed.    [provider]  SYRINGE-NEEDLE, DISP, 3 ML (BD INTEGRA SYRINGE) 25G X 1" 3 ML MISC FOR USE WITH B12 INJECTIONS WEEKLY/MONTHLY 08/01/18   [name], MD  Vitamin D, Ergocalciferol, (DRISDOL) 1.25 MG (50000 UNIT) CAPS capsule TAKE 1 CAPSULE BY MOUTH ONE TIME PER WEEK Patient taking differently: Take 50,000 Units by mouth every 7 (seven) days.  04/12/20   [name], MD    Allergies as of 08/26/2020 - Review Complete 08/25/2020  Allergen Reaction Noted  . Amoxicillin Hives and Rash 02/05/2012  . Avocado Swelling 05/29/2015  . Clarithromycin Hives and Rash 02/05/2012  . Honey Anaphylaxis 04/02/2012  . Other Hives and Swelling 05/29/2015  . Sulfa drugs cross reactors Anaphylaxis 02/05/2012  . Watermelon concentrate Hives and Swelling 04/02/2012  . Adhesive [tape] Itching 04/02/2012  . Levemir [insulin detemir] Rash 06/11/2018  . Victoza [liraglutide] Nausea Only 06/11/2018    Family History  Problem Relation Age of Onset  . Diabetes Mother   . Hypertension Mother   . Hypertension Father   . Diabetes Father   . Depression Other        strong hx of  . Breast cancer Paternal Grandmother 27  . Breast cancer Maternal Grandmother   . Cancer Neg Hx     Social History   Socioeconomic History  . Marital status: Married    Spouse name: Not on file  . Number of children: Not on file  . Years of education: Not on file  . Highest education level: Not on file  Occupational History  . Not on file  Tobacco Use  . Smoking status: Former Smoker    Packs/day: 1.00    Years: 4.00    Pack years: 4.00    Types: Cigarettes    Quit date: 02/05/1996    Years since quitting: 24.7  . Smokeless tobacco: Never Used  Vaping Use  . Vaping Use: Never used  Substance and Sexual Activity  . Alcohol use: No  . Drug use: No  . Sexual activity: Yes    Birth control/protection: None  Other Topics Concern  . Not on file  Social History Narrative  . Not on file    Social Determinants of Health   Financial Resource Strain:   . Difficulty of Paying Living Expenses: Not on file  Food Insecurity:   . Worried About Charity fundraiser in the Last Year: Not on file  . Ran Out of Food in the Last Year: Not on file  Transportation Needs:   . Lack of Transportation (Medical): Not on file  . Lack of Transportation (Non-Medical): Not on file  Physical Activity:   . Days of Exercise per Week: Not on file  . Minutes of Exercise per Session: Not on file  Stress:   . Feeling of Stress : Not on file  Social Connections:   . Frequency of Communication with Friends and Family: Not on file  . Frequency of Social Gatherings with Friends and Family: Not on file  . Attends Religious Services: Not on file  . Active Member of Clubs or Organizations: Not on file  . Attends Archivist Meetings: Not on file  . Marital Status: Not on file  Intimate Partner Violence:   . Fear of Current or Ex-Partner: Not on file  . Emotionally Abused: Not on file  . Physically Abused: Not on file  . Sexually Abused: Not on file    Review of Systems: See HPI, otherwise negative ROS  Physical Exam: LMP 05/09/2016 (LMP Unknown) Comment: D/C General:   Alert,  pleasant and cooperative in NAD Head:  Normocephalic and atraumatic. Neck:  Supple; no masses or thyromegaly. Lungs:  Clear throughout to auscultation, normal respiratory effort.    Heart:  +S1, +S2, Regular rate and rhythm, No edema. Abdomen:  Soft, nontender and nondistended. Normal bowel sounds, without guarding, and without rebound.   Neurologic:  Alert and  oriented x4;  grossly normal neurologically.  Impression/Plan: Jordynne Mccown is here for a colonoscopy to be performed for average risk screening.  Risks, benefits, limitations, and alternatives regarding  colonoscopy have been reviewed with the patient.  Questions have been answered.  All parties agreeable.   Virgel Manifold, MD   10/21/2020, 9:23 AM

## 2020-11-01 ENCOUNTER — Other Ambulatory Visit: Payer: Self-pay | Admitting: Internal Medicine

## 2020-11-01 ENCOUNTER — Ambulatory Visit: Payer: BC Managed Care – PPO | Admitting: Internal Medicine

## 2020-11-01 DIAGNOSIS — Z0289 Encounter for other administrative examinations: Secondary | ICD-10-CM

## 2020-11-23 ENCOUNTER — Other Ambulatory Visit: Payer: Self-pay | Admitting: Gastroenterology

## 2020-11-24 ENCOUNTER — Other Ambulatory Visit
Admission: RE | Admit: 2020-11-24 | Discharge: 2020-11-24 | Disposition: A | Discharge status: Home / Self Care | Payer: BC Managed Care – PPO | Source: Ambulatory Visit | Attending: Orthopedic Surgery | Admitting: Orthopedic Surgery

## 2020-11-24 DIAGNOSIS — S52591A Other fractures of lower end of right radius, initial encounter for closed fracture: Secondary | ICD-10-CM | POA: Diagnosis not present

## 2020-11-24 DIAGNOSIS — Z01812 Encounter for preprocedural laboratory examination: Secondary | ICD-10-CM | POA: Insufficient documentation

## 2020-11-24 DIAGNOSIS — Z9884 Bariatric surgery status: Secondary | ICD-10-CM | POA: Diagnosis not present

## 2020-11-24 DIAGNOSIS — Z880 Allergy status to penicillin: Secondary | ICD-10-CM | POA: Diagnosis not present

## 2020-11-24 DIAGNOSIS — Z20822 Contact with and (suspected) exposure to covid-19: Secondary | ICD-10-CM | POA: Insufficient documentation

## 2020-11-24 DIAGNOSIS — W0110XA Fall on same level from slipping, tripping and stumbling with subsequent striking against unspecified object, initial encounter: Secondary | ICD-10-CM | POA: Diagnosis not present

## 2020-11-24 DIAGNOSIS — Z881 Allergy status to other antibiotic agents status: Secondary | ICD-10-CM | POA: Diagnosis not present

## 2020-11-24 DIAGNOSIS — Z79890 Hormone replacement therapy (postmenopausal): Secondary | ICD-10-CM | POA: Diagnosis not present

## 2020-11-24 DIAGNOSIS — Z79899 Other long term (current) drug therapy: Secondary | ICD-10-CM | POA: Diagnosis not present

## 2020-11-24 DIAGNOSIS — Z888 Allergy status to other drugs, medicaments and biological substances status: Secondary | ICD-10-CM | POA: Diagnosis not present

## 2020-11-24 DIAGNOSIS — Z794 Long term (current) use of insulin: Secondary | ICD-10-CM | POA: Diagnosis not present

## 2020-11-24 DIAGNOSIS — G5601 Carpal tunnel syndrome, right upper limb: Secondary | ICD-10-CM | POA: Diagnosis not present

## 2020-11-24 DIAGNOSIS — Z87891 Personal history of nicotine dependence: Secondary | ICD-10-CM | POA: Diagnosis not present

## 2020-11-24 DIAGNOSIS — S52614A Nondisplaced fracture of right ulna styloid process, initial encounter for closed fracture: Secondary | ICD-10-CM | POA: Diagnosis not present

## 2020-11-24 DIAGNOSIS — Z882 Allergy status to sulfonamides status: Secondary | ICD-10-CM | POA: Diagnosis not present

## 2020-11-25 ENCOUNTER — Other Ambulatory Visit: Payer: Self-pay | Admitting: Orthopedic Surgery

## 2020-11-25 LAB — SARS CORONAVIRUS 2 (TAT 6-24 HRS): SARS Coronavirus 2: NEGATIVE

## 2020-11-26 ENCOUNTER — Emergency Department: Payer: BC Managed Care – PPO | Admitting: Anesthesiology

## 2020-11-26 ENCOUNTER — Encounter
Admission: RE | Disposition: A | Discharge status: Home / Self Care | Payer: Self-pay | Source: Home / Self Care | Attending: Orthopedic Surgery

## 2020-11-26 ENCOUNTER — Encounter: Payer: Self-pay | Admitting: Orthopedic Surgery

## 2020-11-26 ENCOUNTER — Encounter: Payer: Self-pay | Admitting: Emergency Medicine

## 2020-11-26 ENCOUNTER — Ambulatory Visit: Payer: BC Managed Care – PPO | Admitting: Anesthesiology

## 2020-11-26 ENCOUNTER — Encounter
Admission: EM | Disposition: A | Discharge status: Home / Self Care | Payer: Self-pay | Source: Home / Self Care | Attending: Emergency Medicine

## 2020-11-26 ENCOUNTER — Telehealth: Payer: Self-pay | Admitting: Emergency Medicine

## 2020-11-26 ENCOUNTER — Other Ambulatory Visit: Payer: Self-pay

## 2020-11-26 ENCOUNTER — Ambulatory Visit: Payer: BC Managed Care – PPO

## 2020-11-26 ENCOUNTER — Ambulatory Visit
Admission: RE | Admit: 2020-11-26 | Discharge: 2020-11-26 | Disposition: A | Discharge status: Home / Self Care | Payer: BC Managed Care – PPO | Attending: Orthopedic Surgery | Admitting: Orthopedic Surgery

## 2020-11-26 ENCOUNTER — Ambulatory Visit
Admission: EM | Admit: 2020-11-26 | Discharge: 2020-11-27 | Disposition: A | Discharge status: Home / Self Care | Payer: BC Managed Care – PPO | Source: Home / Self Care | Attending: Emergency Medicine | Admitting: Emergency Medicine

## 2020-11-26 DIAGNOSIS — Z794 Long term (current) use of insulin: Secondary | ICD-10-CM | POA: Insufficient documentation

## 2020-11-26 DIAGNOSIS — R202 Paresthesia of skin: Secondary | ICD-10-CM

## 2020-11-26 DIAGNOSIS — Z7951 Long term (current) use of inhaled steroids: Secondary | ICD-10-CM | POA: Insufficient documentation

## 2020-11-26 DIAGNOSIS — Z20822 Contact with and (suspected) exposure to covid-19: Secondary | ICD-10-CM | POA: Insufficient documentation

## 2020-11-26 DIAGNOSIS — G5601 Carpal tunnel syndrome, right upper limb: Secondary | ICD-10-CM | POA: Insufficient documentation

## 2020-11-26 DIAGNOSIS — Z888 Allergy status to other drugs, medicaments and biological substances status: Secondary | ICD-10-CM | POA: Insufficient documentation

## 2020-11-26 DIAGNOSIS — Z87891 Personal history of nicotine dependence: Secondary | ICD-10-CM | POA: Insufficient documentation

## 2020-11-26 DIAGNOSIS — Z882 Allergy status to sulfonamides status: Secondary | ICD-10-CM | POA: Insufficient documentation

## 2020-11-26 DIAGNOSIS — M79601 Pain in right arm: Secondary | ICD-10-CM | POA: Insufficient documentation

## 2020-11-26 DIAGNOSIS — Z79899 Other long term (current) drug therapy: Secondary | ICD-10-CM | POA: Insufficient documentation

## 2020-11-26 DIAGNOSIS — Z880 Allergy status to penicillin: Secondary | ICD-10-CM | POA: Insufficient documentation

## 2020-11-26 DIAGNOSIS — R200 Anesthesia of skin: Secondary | ICD-10-CM | POA: Insufficient documentation

## 2020-11-26 DIAGNOSIS — Z8616 Personal history of COVID-19: Secondary | ICD-10-CM | POA: Insufficient documentation

## 2020-11-26 DIAGNOSIS — Z9889 Other specified postprocedural states: Secondary | ICD-10-CM

## 2020-11-26 DIAGNOSIS — Z9884 Bariatric surgery status: Secondary | ICD-10-CM | POA: Insufficient documentation

## 2020-11-26 DIAGNOSIS — Z79890 Hormone replacement therapy (postmenopausal): Secondary | ICD-10-CM | POA: Insufficient documentation

## 2020-11-26 DIAGNOSIS — W0110XA Fall on same level from slipping, tripping and stumbling with subsequent striking against unspecified object, initial encounter: Secondary | ICD-10-CM | POA: Insufficient documentation

## 2020-11-26 DIAGNOSIS — S52614A Nondisplaced fracture of right ulna styloid process, initial encounter for closed fracture: Secondary | ICD-10-CM | POA: Insufficient documentation

## 2020-11-26 DIAGNOSIS — Z881 Allergy status to other antibiotic agents status: Secondary | ICD-10-CM | POA: Insufficient documentation

## 2020-11-26 DIAGNOSIS — Z8781 Personal history of (healed) traumatic fracture: Secondary | ICD-10-CM

## 2020-11-26 DIAGNOSIS — S52591A Other fractures of lower end of right radius, initial encounter for closed fracture: Secondary | ICD-10-CM | POA: Insufficient documentation

## 2020-11-26 HISTORY — PX: CARPAL TUNNEL RELEASE: SHX101

## 2020-11-26 HISTORY — PX: OPEN REDUCTION INTERNAL FIXATION (ORIF) DISTAL RADIAL FRACTURE: SHX5989

## 2020-11-26 LAB — GLUCOSE, CAPILLARY
Glucose-Capillary: 359 mg/dL — ABNORMAL HIGH (ref 70–99)
Glucose-Capillary: 83 mg/dL (ref 70–99)
Glucose-Capillary: 84 mg/dL (ref 70–99)

## 2020-11-26 LAB — CBG MONITORING, ED: Glucose-Capillary: 235 mg/dL — ABNORMAL HIGH (ref 70–99)

## 2020-11-26 SURGERY — OPEN REDUCTION INTERNAL FIXATION (ORIF) DISTAL RADIUS FRACTURE
Anesthesia: General | Site: Wrist | Laterality: Right

## 2020-11-26 SURGERY — CARPAL TUNNEL RELEASE
Anesthesia: General | Laterality: Right

## 2020-11-26 MED ORDER — FENTANYL CITRATE (PF) 100 MCG/2ML IJ SOLN
INTRAMUSCULAR | Status: AC
  Administered 2020-11-26: 25 ug via INTRAVENOUS
  Filled 2020-11-26: qty 2

## 2020-11-26 MED ORDER — MORPHINE SULFATE (PF) 2 MG/ML IV SOLN
2.0000 mg | Freq: Once | INTRAVENOUS | Status: AC
  Administered 2020-11-26: 2 mg via INTRAVENOUS
  Filled 2020-11-26: qty 1

## 2020-11-26 MED ORDER — PROPOFOL 10 MG/ML IV BOLUS
INTRAVENOUS | Status: AC
  Filled 2020-11-26: qty 20

## 2020-11-26 MED ORDER — SODIUM CHLORIDE 0.9 % IV SOLN: INTRAVENOUS | Status: DC | PRN

## 2020-11-26 MED ORDER — ONDANSETRON HCL 4 MG/2ML IJ SOLN
4.0000 mg | Freq: Once | INTRAMUSCULAR | Status: AC
  Administered 2020-11-26: 4 mg via INTRAVENOUS
  Filled 2020-11-26: qty 2

## 2020-11-26 MED ORDER — FENTANYL CITRATE (PF) 100 MCG/2ML IJ SOLN
25.0000 ug | INTRAMUSCULAR | Status: DC | PRN
  Administered 2020-11-26: 25 ug via INTRAVENOUS

## 2020-11-26 MED ORDER — MIDAZOLAM HCL 2 MG/2ML IJ SOLN
INTRAMUSCULAR | Status: DC | PRN
  Administered 2020-11-26: 2 mg via INTRAVENOUS

## 2020-11-26 MED ORDER — FENTANYL CITRATE (PF) 100 MCG/2ML IJ SOLN
INTRAMUSCULAR | Status: AC
  Filled 2020-11-26: qty 2

## 2020-11-26 MED ORDER — PROMETHAZINE HCL 25 MG/ML IJ SOLN
6.2500 mg | INTRAMUSCULAR | Status: DC | PRN
  Administered 2020-11-26: 6.25 mg via INTRAVENOUS

## 2020-11-26 MED ORDER — OXYCODONE HCL 5 MG PO TABS: 5.0000 mg | ORAL_TABLET | Freq: Three times a day (TID) | ORAL | 0 refills | Status: DC | PRN

## 2020-11-26 MED ORDER — METOCLOPRAMIDE HCL 10 MG PO TABS: 5.0000 mg | ORAL_TABLET | Freq: Three times a day (TID) | ORAL | Status: DC | PRN

## 2020-11-26 MED ORDER — MIDAZOLAM HCL 2 MG/2ML IJ SOLN
INTRAMUSCULAR | Status: AC
  Filled 2020-11-26: qty 2

## 2020-11-26 MED ORDER — PROPOFOL 10 MG/ML IV BOLUS
INTRAVENOUS | Status: DC | PRN
  Administered 2020-11-26: 130 mg via INTRAVENOUS

## 2020-11-26 MED ORDER — LACTATED RINGERS IV SOLN: INTRAVENOUS | Status: DC | PRN

## 2020-11-26 MED ORDER — MORPHINE SULFATE (PF) 4 MG/ML IV SOLN
4.0000 mg | Freq: Once | INTRAVENOUS | Status: AC
  Administered 2020-11-26: 4 mg via INTRAVENOUS
  Filled 2020-11-26: qty 1

## 2020-11-26 MED ORDER — PROPOFOL 10 MG/ML IV BOLUS
INTRAVENOUS | Status: DC | PRN
  Administered 2020-11-26: 150 mg via INTRAVENOUS

## 2020-11-26 MED ORDER — SODIUM CHLORIDE 0.9 % IV SOLN: INTRAVENOUS | Status: DC

## 2020-11-26 MED ORDER — ONDANSETRON HCL 4 MG PO TABS: 4.0000 mg | ORAL_TABLET | Freq: Four times a day (QID) | ORAL | Status: DC | PRN

## 2020-11-26 MED ORDER — FENTANYL CITRATE (PF) 100 MCG/2ML IJ SOLN
INTRAMUSCULAR | Status: AC
  Administered 2020-11-26: 50 ug via INTRAVENOUS
  Filled 2020-11-26: qty 2

## 2020-11-26 MED ORDER — ONDANSETRON HCL 4 MG/2ML IJ SOLN: 4.0000 mg | Freq: Four times a day (QID) | INTRAMUSCULAR | Status: DC | PRN

## 2020-11-26 MED ORDER — ORAL CARE MOUTH RINSE: 15.0000 mL | Freq: Once | OROMUCOSAL | Status: AC

## 2020-11-26 MED ORDER — LIDOCAINE HCL (CARDIAC) PF 100 MG/5ML IV SOSY
PREFILLED_SYRINGE | INTRAVENOUS | Status: DC | PRN
  Administered 2020-11-26: 100 mg via INTRAVENOUS

## 2020-11-26 MED ORDER — ONDANSETRON HCL 4 MG/2ML IJ SOLN
INTRAMUSCULAR | Status: DC | PRN
  Administered 2020-11-26: 4 mg via INTRAVENOUS

## 2020-11-26 MED ORDER — PROMETHAZINE HCL 25 MG/ML IJ SOLN
INTRAMUSCULAR | Status: AC
  Filled 2020-11-26: qty 1

## 2020-11-26 MED ORDER — CLINDAMYCIN PHOSPHATE 900 MG/50ML IV SOLN
INTRAVENOUS | Status: AC
  Filled 2020-11-26: qty 50

## 2020-11-26 MED ORDER — CHLORHEXIDINE GLUCONATE 0.12 % MT SOLN
OROMUCOSAL | Status: AC
  Administered 2020-11-26: 15 mL via OROMUCOSAL
  Filled 2020-11-26: qty 15

## 2020-11-26 MED ORDER — OXYCODONE HCL 5 MG PO TABS
ORAL_TABLET | ORAL | Status: AC
  Filled 2020-11-26: qty 1

## 2020-11-26 MED ORDER — PHENYLEPHRINE HCL (PRESSORS) 10 MG/ML IV SOLN
INTRAVENOUS | Status: DC | PRN
  Administered 2020-11-26: 100 ug via INTRAVENOUS
  Administered 2020-11-26: 300 ug via INTRAVENOUS
  Administered 2020-11-26 (×4): 200 ug via INTRAVENOUS

## 2020-11-26 MED ORDER — METOCLOPRAMIDE HCL 5 MG/ML IJ SOLN: 5.0000 mg | Freq: Three times a day (TID) | INTRAMUSCULAR | Status: DC | PRN

## 2020-11-26 MED ORDER — SCOPOLAMINE 1 MG/3DAYS TD PT72
MEDICATED_PATCH | TRANSDERMAL | Status: AC
  Administered 2020-11-26: 1.5 mg via TRANSDERMAL
  Filled 2020-11-26: qty 1

## 2020-11-26 MED ORDER — PROPOFOL 500 MG/50ML IV EMUL
INTRAVENOUS | Status: AC
  Filled 2020-11-26: qty 50

## 2020-11-26 MED ORDER — FENTANYL CITRATE (PF) 100 MCG/2ML IJ SOLN
INTRAMUSCULAR | Status: DC | PRN
  Administered 2020-11-26: 100 ug via INTRAVENOUS

## 2020-11-26 MED ORDER — SEVOFLURANE IN SOLN
RESPIRATORY_TRACT | Status: AC
  Filled 2020-11-26: qty 250

## 2020-11-26 MED ORDER — SCOPOLAMINE 1 MG/3DAYS TD PT72: 1.0000 | MEDICATED_PATCH | Freq: Once | TRANSDERMAL | Status: DC

## 2020-11-26 MED ORDER — NEOMYCIN-POLYMYXIN B GU 40-200000 IR SOLN
Status: DC | PRN
  Administered 2020-11-26: 2 mL

## 2020-11-26 MED ORDER — CLINDAMYCIN PHOSPHATE 900 MG/50ML IV SOLN
900.0000 mg | INTRAVENOUS | Status: AC
  Administered 2020-11-26: 900 mg via INTRAVENOUS

## 2020-11-26 MED ORDER — CHLORHEXIDINE GLUCONATE 0.12 % MT SOLN: 15.0000 mL | Freq: Once | OROMUCOSAL | Status: AC

## 2020-11-26 MED ORDER — FENTANYL CITRATE (PF) 100 MCG/2ML IJ SOLN
INTRAMUSCULAR | Status: DC | PRN
  Administered 2020-11-26: 50 ug via INTRAVENOUS

## 2020-11-26 MED ORDER — EPHEDRINE SULFATE 50 MG/ML IJ SOLN
INTRAMUSCULAR | Status: DC | PRN
  Administered 2020-11-26: 5 mg via INTRAVENOUS
  Administered 2020-11-26 (×2): 10 mg via INTRAVENOUS

## 2020-11-26 MED ORDER — NEOMYCIN-POLYMYXIN B GU 40-200000 IR SOLN
Status: AC
  Filled 2020-11-26: qty 1

## 2020-11-26 MED ORDER — BUPIVACAINE HCL (PF) 0.5 % IJ SOLN
INTRAMUSCULAR | Status: AC
  Filled 2020-11-26: qty 30

## 2020-11-26 MED ORDER — FENTANYL CITRATE (PF) 100 MCG/2ML IJ SOLN
25.0000 ug | INTRAMUSCULAR | Status: DC | PRN
  Administered 2020-11-26: 50 ug via INTRAVENOUS
  Administered 2020-11-26: 25 ug via INTRAVENOUS

## 2020-11-26 MED ORDER — OXYCODONE HCL 5 MG PO TABS
5.0000 mg | ORAL_TABLET | Freq: Once | ORAL | Status: AC
  Administered 2020-11-26: 5 mg via ORAL

## 2020-11-26 SURGICAL SUPPLY — 25 items
APL PRP STRL LF DISP 70% ISPRP (MISCELLANEOUS) ×1
BNDG ELASTIC 3X5.8 VLCR STR LF (GAUZE/BANDAGES/DRESSINGS) ×2 IMPLANT
CANISTER SUCT 1200ML W/VALVE (MISCELLANEOUS) ×2 IMPLANT
CHLORAPREP W/TINT 26 (MISCELLANEOUS) ×2 IMPLANT
COVER WAND RF STERILE (DRAPES) ×2 IMPLANT
CUFF TOURN SGL QUICK 18X4 (TOURNIQUET CUFF) ×2 IMPLANT
ELECT CAUTERY NEEDLE 2.0 MIC (NEEDLE) ×2 IMPLANT
GAUZE SPONGE 4X4 12PLY STRL (GAUZE/BANDAGES/DRESSINGS) ×2 IMPLANT
GAUZE XEROFORM 1X8 LF (GAUZE/BANDAGES/DRESSINGS) ×2 IMPLANT
GLOVE SURG SYN 9.0  PF PI (GLOVE) ×1
GLOVE SURG SYN 9.0 PF PI (GLOVE) ×1 IMPLANT
GOWN SRG 2XL LVL 4 RGLN SLV (GOWNS) ×1 IMPLANT
GOWN STRL NON-REIN 2XL LVL4 (GOWNS) ×2
GOWN STRL REUS W/ TWL LRG LVL3 (GOWN DISPOSABLE) ×1 IMPLANT
GOWN STRL REUS W/TWL LRG LVL3 (GOWN DISPOSABLE) ×2
KIT TURNOVER KIT A (KITS) ×2 IMPLANT
MANIFOLD NEPTUNE II (INSTRUMENTS) ×2 IMPLANT
NS IRRIG 500ML POUR BTL (IV SOLUTION) ×2 IMPLANT
PACK EXTREMITY ARMC (MISCELLANEOUS) ×2 IMPLANT
PAD CAST CTTN 4X4 STRL (SOFTGOODS) ×1 IMPLANT
PADDING CAST COTTON 4X4 STRL (SOFTGOODS) ×2
SCALPEL PROTECTED #15 DISP (BLADE) ×4 IMPLANT
SUT ETHILON 4-0 (SUTURE) ×2
SUT ETHILON 4-0 FS2 18XMFL BLK (SUTURE) ×1
SUTURE ETHLN 4-0 FS2 18XMF BLK (SUTURE) ×1 IMPLANT

## 2020-11-26 SURGICAL SUPPLY — 43 items
APL PRP STRL LF DISP 70% ISPRP (MISCELLANEOUS) ×1
BIT DRILL 2 FAST STEP (BIT) ×2 IMPLANT
BIT DRILL 2.5X4 QC (BIT) ×2 IMPLANT
BNDG ELASTIC 4X5.8 VLCR STR LF (GAUZE/BANDAGES/DRESSINGS) ×2 IMPLANT
CANISTER SUCT 1200ML W/VALVE (MISCELLANEOUS) ×2 IMPLANT
CHLORAPREP W/TINT 26 (MISCELLANEOUS) ×2 IMPLANT
COVER WAND RF STERILE (DRAPES) ×2 IMPLANT
CUFF TOURN SGL QUICK 18X4 (TOURNIQUET CUFF) ×2 IMPLANT
DRAPE FLUOR MINI C-ARM 54X84 (DRAPES) ×2 IMPLANT
ELECT CAUTERY BLADE 6.4 (BLADE) ×2 IMPLANT
ELECT REM PT RETURN 9FT ADLT (ELECTROSURGICAL) ×2
ELECTRODE REM PT RTRN 9FT ADLT (ELECTROSURGICAL) ×1 IMPLANT
GAUZE SPONGE 4X4 12PLY STRL (GAUZE/BANDAGES/DRESSINGS) ×2 IMPLANT
GAUZE XEROFORM 1X8 LF (GAUZE/BANDAGES/DRESSINGS) ×4 IMPLANT
GLOVE SURG SYN 9.0  PF PI (GLOVE) ×1
GLOVE SURG SYN 9.0 PF PI (GLOVE) ×1 IMPLANT
GOWN SRG 2XL LVL 4 RGLN SLV (GOWNS) ×1 IMPLANT
GOWN STRL NON-REIN 2XL LVL4 (GOWNS) ×2
GOWN STRL REUS W/ TWL LRG LVL3 (GOWN DISPOSABLE) ×1 IMPLANT
GOWN STRL REUS W/TWL LRG LVL3 (GOWN DISPOSABLE) ×2
K-WIRE 1.6 (WIRE) ×2
K-WIRE FX5X1.6XNS BN SS (WIRE) ×1
KIT TURNOVER KIT A (KITS) ×2 IMPLANT
KWIRE FX5X1.6XNS BN SS (WIRE) ×1 IMPLANT
MANIFOLD NEPTUNE II (INSTRUMENTS) ×2 IMPLANT
NEEDLE FILTER BLUNT 18X 1/2SAF (NEEDLE) ×1
NEEDLE FILTER BLUNT 18X1 1/2 (NEEDLE) ×1 IMPLANT
NS IRRIG 500ML POUR BTL (IV SOLUTION) ×2 IMPLANT
PACK EXTREMITY ARMC (MISCELLANEOUS) ×2 IMPLANT
PAD CAST CTTN 4X4 STRL (SOFTGOODS) ×2 IMPLANT
PADDING CAST COTTON 4X4 STRL (SOFTGOODS) ×4
PEG SUBCHONDRAL SMOOTH 2.0X16 (Peg) ×8 IMPLANT
PEG SUBCHONDRAL SMOOTH 2.0X18 (Peg) ×2 IMPLANT
PEG SUBCHONDRAL SMOOTH 2.0X20 (Peg) ×2 IMPLANT
PLATE SHORT 21.6X48.9 NRRW RT (Plate) ×2 IMPLANT
SCALPEL PROTECTED #15 DISP (BLADE) ×4 IMPLANT
SCREW CORT 3.5X10 LNG (Screw) ×6 IMPLANT
SPLINT CAST 1 STEP 3X12 (MISCELLANEOUS) ×2 IMPLANT
SUT ETHILON 4-0 (SUTURE) ×2
SUT ETHILON 4-0 FS2 18XMFL BLK (SUTURE) ×1
SUT VICRYL 3-0 27IN (SUTURE) ×2 IMPLANT
SUTURE ETHLN 4-0 FS2 18XMF BLK (SUTURE) ×1 IMPLANT
SYR 3ML LL SCALE MARK (SYRINGE) ×2 IMPLANT

## 2020-11-26 NOTE — H&P (View-Only) (Signed)
Patient left the outpatient area and came to the emergency room because of profound numbness to the thumb index middle finger fully goes numb to her whole hand I saw her in the emergency room and entire splint and Ace wrap and web roll were removed.  She is hurting quite a bit which was expected as the bone was lengthened.  With her numbness if it does not improve with short time of observation resume acute carpal tunnel somewhat related to the surgery and will require acute carpal tunnel release.

## 2020-11-26 NOTE — H&P (Signed)
Maria Bentley is a 49 y.o. female who presents today for evaluation of right distal radius fracture, date of injury 11/02/2020. Patient tripped and fell landing on her right outstretched hand. She is right-hand dominant. 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 There was a delay getting into orthopedics due to concern for possible Worker's Comp. injury. Worker's Comp. denied claim. Patient comes in today for recheck, states she has had increasing pain. Pain is throughout the distal radius and a little bit along the ulnar styloid. She denies any numbness or tingling. She has been taking Tylenol and Excedrin with little relief. She enjoys reading, performs a lot of typing and paperwork at work. Patient states she is unable to do her normal job duties at work due to the pain and discomfort.  Past Medical History: Past Medical History:  Diagnosis Date  . Anemia, unspecified  . Asthma in adult  . Depression (emotion)  . Diabetes (CMS-HCC)  . Fibromyalgia  . High cholesterol  . Hypertension  . Joint pain  . Migraine headache  . Sleep apnea  . Weight loss   Past Surgical History: Past Surgical History:  Procedure Laterality Date  . DILATION AND CURETTAGE, DIAGNOSTIC / THERAPEUTIC 2010  . Exploratory surgery, lower abdomen 1991  . LAPAROSCOPIC GASTRIC BYPASS  . Right arthroscopic rotator cuff repair, biceps tenodesis, R subacromial decompression, R extensive debridement of shoulder (glenohumeral and subacromial spaces) Right 07/19/2020  Dr. Posey Pronto  . Shoulder spur, rotator cuff 2008   Past Family History: Family History  Problem Relation Age of Onset  . Diabetes Mother  . High blood pressure (Hypertension) Mother  . Diabetes Father  . High blood pressure (Hypertension) Father  . Stroke Maternal Grandmother  . Cancer Maternal Grandmother   Medications: Current Outpatient Medications Ordered in Epic  Medication Sig Dispense Refill  . acetaminophen  (TYLENOL) 325 MG tablet Take 650 mg by mouth every 4 (four) hours as needed for Pain.  Marland Kitchen acetaminophen (TYLENOL) 500 MG tablet Take 2 tablets (1,000 mg total) by mouth every 8 (eight) hours.  Marland Kitchen albuterol (PROVENTIL HFA,VENTOLIN HFA) 90 mcg/actuation inhaler  . amitriptyline (ELAVIL) 10 MG tablet Take 10 mg by mouth nightly (Patient not taking: Reported on 10/06/2020 )  . amitriptyline (ELAVIL) 25 MG tablet Take 25 mg by mouth nightly  . aspirin-acetaminophen-caffeine (EXCEDRIN MIGRAINE) 250-250-65 mg per tablet Take by mouth  . atorvastatin (LIPITOR) 20 MG tablet Take 20 mg by mouth once daily.   Marland Kitchen azelastine (ASTELIN) 137 mcg nasal spray INSTILL 1 2 SPRAYS IN EACH NOSTRIL TWICE A DAY  . azithromycin (ZITHROMAX) 500 MG tablet Take 500 mg by mouth once daily (Patient not taking: Reported on 10/06/2020 )  . BASAGLAR KWIKPEN U-100 INSULIN pen injector (concentration 100 units/mL) INJECT 50 UNITS INTO THE SKIN DAILY.  . BD INTEGRA SYRINGE 3 mL 25 gauge x 1" Syrg FOR USE WITH B12 INJECTIONS WEEKLY/MONTHLY  . buPROPion (WELLBUTRIN XL) 150 MG XL tablet TAKE 3 TABLETS BY MOUTH IN THE MORNING  . CLINPRO 5000 1.1 % dental paste BRUSH FOR 2 MINUTES ONCE A DAY AT BEDTIME AND SPIT. DO NOT RINSE FOR 30 MINUTES  . colestipoL (COLESTID) 1 gram tablet TAKE 2 TABLETS (2 G TOTAL) BY MOUTH 2 (TWO) TIMES DAILY.  . cyanocobalamin (VITAMIN B12) 1,000 mcg/mL injection Inject into the muscle.  . cyclobenzaprine (FLEXERIL) 10 MG tablet  . diclofenac (VOLTAREN) 1 % topical gel diclofenac 1 % topical gel (Patient not taking: Reported on 10/06/2020)  .  diltiazem (CARDIZEM CD) 300 MG CD capsule Take 300 mg by mouth once daily (Patient not taking: Reported on 10/06/2020 )  . diphenhydrAMINE (BENADRYL) 25 mg tablet Take by mouth  . diphenoxylate-atropine (LOMOTIL) 2.5-0.025 mg tablet TAKE 1 TABLET BY MOUTH 4 (FOUR) TIMES DAILY AS NEEDED FOR DIARRHEA OR LOOSE STOOLS.  Marland Kitchen epinephrine (EPIPEN) 0.3 mg/0.3 mL pen injector  .  ergocalciferol, vitamin D2, 1,250 mcg (50,000 unit) capsule ergocalciferol (vitamin D2) 1,250 mcg (50,000 unit) capsule TAKE 1 CAPSULE BY MOUTH ONE TIME PER WEEK  . escitalopram oxalate (LEXAPRO) 20 MG tablet Take 20 mg by mouth once daily  . fluticasone propionate (FLONASE) 50 mcg/actuation nasal spray SPRAY 2 SPRAYS INTO EACH NOSTRIL EVERY DAY  . FREESTYLE LIBRE 14 DAY SENSOR kit USE TO CHECK BLOOD SUGAR FOUR TIMES A DAY AS NEEDED  . FUROsemide (LASIX) 20 MG tablet TAKE 1 TABLET (20 MG TOTAL) BY MOUTH DAILY. AS NEEDED FOR FLUID RETENTION  . gentamicin 0.1 % cream APPLY TO AFFECTED AREA TWICE A DAY  . HORIZANT 600 mg ER tablet Take 600 mg by mouth 2 (two) times daily  . insulin ASPART (NOVOLOG FLEXPEN U-100 INSULIN) pen injector (concentration 100 units/mL) Inject 20 Units subcutaneously 3 (three) times a day.  . lamoTRIgine (LAMICTAL) 150 MG tablet Take 150 mg by mouth  . levothyroxine (SYNTHROID) 50 MCG tablet Take 50 mcg by mouth once daily  . loperamide (IMODIUM) 2 mg capsule loperamide 2 mg capsule TAKE 2 CAPSULES BY MOUTH AS NEEDED FOR DIARRHEA OR LOOSE STOOLS  . LORazepam (ATIVAN) 0.5 MG tablet  . losartan (COZAAR) 25 MG tablet Take 1 tablet by mouth once daily  . meclizine (ANTIVERT) 25 mg tablet Take 1 tablet by mouth 3 (three) times daily as needed.  . melatonin 10 mg Tab Take by mouth  . metFORMIN (GLUCOPHAGE-XR) 500 MG XR tablet metformin ER 500 mg tablet,extended release 24 hr (Patient not taking: Reported on 10/06/2020)  . metOLazone (ZAROXOLYN) 2.5 MG tablet TAKE 1 TABLET BY MOUTH DAILY 30 MIN BEFORE FUROSEMIDE (Patient not taking: Reported on 10/06/2020)  . metoprolol tartrate (LOPRESSOR) 25 MG tablet Take 25 mg by mouth 2 (two) times daily  . NOVOLIN N NPH U-100 INSULIN injection (concentration 100 units/mL) Inject 60 Units subcutaneously nightly.  5  . nystatin (MYCOSTATIN) 100,000 unit/gram powder APPLY 1 APPLICATION TO AFFECTED AREA TWICE A DAY  . onabotulinumtoxinA (BOTOX  INJ) Inject as directed every 3 (three) months.  . promethazine (PHENERGAN) 25 MG tablet Take 1 tablet by mouth every 8 (eight) hours as needed.  . rimegepant (NURTEC ODT) 75 mg disintegrating tablet Nurtec ODT 75 mg disintegrating tablet  . SUMAtriptan (IMITREX) 100 MG tablet Take 1 tablet by mouth once daily as needed. (Patient not taking: Reported on 10/06/2020 )  . traMADol (ULTRAM) 50 mg tablet (Patient not taking: Reported on 10/06/2020 )  . triamcinolone 0.1 % cream triamcinolone acetonide 0.1 % topical cream   No current Epic-ordered facility-administered medications on file.   Allergies: Allergies  Allergen Reactions  . Avocado Swelling  . Other Hives, Swelling and Anaphylaxis  All melon  . Sulfa (Sulfonamide Antibiotics) Shortness Of Breath  . Amoxicillin Rash  . Biaxin [Clarithromycin] Rash  . Metoprolol Other (See Comments)  Hypotension  . Amoxicillin Hives  . Honey Other (See Comments)  . Melon Other (See Comments)  . Adhesive Other (See Comments) and Itching  Skin breakdown  . Insulin Detemir Rash  Red/swollen injection site reactions with residual lumps  . Liraglutide  Nausea    Review of Systems:  A comprehensive 14 point ROS was performed, reviewed by me today, and the pertinent orthopaedic findings are documented in the HPI.  Exam: LMP 01/27/2016  General:  Well developed, well nourished, no apparent distress, normal affect, normal gait with no antalgic component.   HEENT: Head normocephalic, atraumatic, PERRL.   Abdomen: Soft, non tender, non distended, Bowel sounds present.  Heart: Examination of the heart reveals regular, rate, and rhythm. There is no murmur noted on ascultation. There is a normal apical pulse.  Lungs: Lungs are clear to auscultation. There is no wheeze, rhonchi, or crackles. There is normal expansion of bilateral chest walls.   Right wrist: Examination of the right wrist shows slight deformity with severe tenderness along the  distal radial metaphysis and radial carpal joint. No scaphoid tenderness. No CMC tenderness. Minimal swelling throughout the wrist and digits. Wrist range of motion is limited secondary to pain. Sensation is intact distally. 2+ radial pulse and 2+ cap refill.  AP lateral and oblique views of the right wrist are ordered interpreted by me in the office today. Impression: Patient has a comminuted displaced distal radial metaphyseal fracture with intra-articular extension. There are several fragments. There is dorsal impaction with significant increase in dorsal tilt on the lateral view when compared to previous x-rays from 11/02/2020. There is intra-articular offset of the distal radius when compared to previous x-rays. There is a nondisplaced ulnar styloid fracture.  Impression: Displaced fracture of distal end of right radius [S52.501A] Displaced fracture of distal end of right radius (primary encounter diagnosis) Other closed fracture of distal end of right ulna, initial encounter  Plan:  9. 49 year old female right-hand-dominant with comminuted displaced right distal radius fracture. Risks, benefits, complications of a right distal radius ORIF have been discussed with the patient. Patient has agreed and consented to procedure with Dr. Hessie Knows on 11/26/2020.  This note was generated in part with voice recognition software and I apologize for any typographical errors that were not detected and corrected.  Feliberto Gottron MPA-C    Electronically signed by Feliberto Gottron, PA at 11/24/2020 10:55 AM EST    Reviewed  H+P. No changes noted.

## 2020-11-26 NOTE — ED Triage Notes (Signed)
Pt presents to ED with c/o of recently having surgery on her R radial nerve in her wrist today at 1130 due to a recent FX. Pt states she came back to ED due to having increased pain and numbness to her index, thumb and pointer fingers. Surgery MD at bedside and took off dressing ace bandage. Pt states pain is worse than initial injury. Pt is A&Ox4. Pt appears to be in severe pain at this time. Pt is able to move all fingers at this time. VSS.

## 2020-11-26 NOTE — Progress Notes (Signed)
Patient left the outpatient area and came to the emergency room because of profound numbness to the thumb index middle finger fully goes numb to her whole hand I saw her in the emergency room and entire splint and Ace wrap and web roll were removed.  She is hurting quite a bit which was expected as the bone was lengthened.  With her numbness if it does not improve with short time of observation resume acute carpal tunnel somewhat related to the surgery and will require acute carpal tunnel release.

## 2020-11-26 NOTE — Anesthesia Procedure Notes (Signed)
Procedure Name: LMA Insertion Date/Time: 11/26/2020 11:21 AM Performed by: Leander Rams, CRNA Pre-anesthesia Checklist: Patient identified, Emergency Drugs available, Suction available, Patient being monitored and Timeout performed Patient Re-evaluated:Patient Re-evaluated prior to induction Oxygen Delivery Method: Circle system utilized Preoxygenation: Pre-oxygenation with 100% oxygen Induction Type: IV induction Ventilation: Mask ventilation without difficulty LMA: LMA inserted LMA Size: 4.0 Tube secured with: Tape Dental Injury: Teeth and Oropharynx as per pre-operative assessment

## 2020-11-26 NOTE — Anesthesia Procedure Notes (Signed)
Procedure Name: LMA Insertion Date/Time: 11/26/2020 11:21 PM Performed by: Esaw Grandchild, CRNA Pre-anesthesia Checklist: Patient identified, Emergency Drugs available, Suction available, Patient being monitored and Timeout performed Patient Re-evaluated:Patient Re-evaluated prior to induction Oxygen Delivery Method: Circle system utilized Preoxygenation: Pre-oxygenation with 100% oxygen Induction Type: IV induction Ventilation: Mask ventilation without difficulty and Oral airway inserted - appropriate to patient size LMA: LMA inserted LMA Size: 4.0 Number of attempts: 1 Tube secured with: Tape Dental Injury: Teeth and Oropharynx as per pre-operative assessment

## 2020-11-26 NOTE — Anesthesia Postprocedure Evaluation (Signed)
Anesthesia Post Note  Patient: Ameilia Rattan Kruer  Procedure(s) Performed: Open reduction internal fixation, right distal radius (Right Wrist)  Patient location during evaluation: PACU Anesthesia Type: General Level of consciousness: awake and alert Pain management: pain level controlled Vital Signs Assessment: post-procedure vital signs reviewed and stable Respiratory status: spontaneous breathing, nonlabored ventilation, respiratory function stable and patient connected to nasal cannula oxygen Cardiovascular status: blood pressure returned to baseline and stable Postop Assessment: no apparent nausea or vomiting Anesthetic complications: no   No complications documented.   Last Vitals:  Vitals:   11/26/20 1255 11/26/20 1306  BP: 102/67 99/69  Pulse: 75 77  Resp: 17 16  Temp:  (!) 36.3 C  SpO2: 100% 100%    Last Pain:  Vitals:   11/26/20 1306  TempSrc: Temporal  PainSc: 4                  Martha Clan

## 2020-11-26 NOTE — ED Provider Notes (Signed)
[facility] Emergency Department Provider Note ____________________________________________   Event Date/Time   First MD Initiated Contact with Patient 11/26/20 1606     (approximate)  I have reviewed the triage vital signs and the nursing notes.  HISTORY  Chief Complaint Severe right arm pain   HPI Marnae Madani Conradt is a 49 y.o. female here for reevaluation of severe right forearm and hand pain  Also numbness in her right hand primarily thumb second and third digit  Just had surgery, she left and reports she was having significant pain after surgery and then after discharge it continued to worsen.  She reports severe pain in the right hand with numbness in the right first through third.  She had surgery with Dr. Rudene Christians earlier today.  She woke up today with a slight migraine which is not unusual.  She feels improved now.  Reports some nausea due to severity of the pain and she reports 10 out of 10 pain over her right mid forearm.  No recent infection.  No abdominal pain.  Did have oxycodone   Past Medical History:  Diagnosis Date  . Abscess of right buttock 04/30/2019  . Anemia   . Anxiety   . COVID-19 virus infection   . Depression 2010  . Diabetes mellitus   . DKA, type 2 ([place]) 10/13/2019  . Dysrhythmia   . Endometriosis   . Headache, common migraine, intractable   . History of kidney stones   . History of ovarian cyst    left sided, caused by her endometriosis (possibly an endometrioma), in her 18s.  Marland Kitchen Hyperlipidemia   . Hypertension   . Kidney stones    left  . Morbid obesity ([place])    s/p gastric bypass  . Personal hx of gastric bypass 2010  . Rosacea   . Vertigo     Patient Active Problem List   Diagnosis Date Noted  . Encounter for screening colonoscopy   . Diarrhea 09/04/2020  . Anemia in chronic kidney disease 08/25/2020  . Benign hypertensive kidney disease with chronic kidney disease 08/25/2020  . Hematuria 08/25/2020  .  Stage 3a chronic kidney disease ([place]) 08/25/2020  . Analgesic overuse headache 08/24/2020  . Atypical facial pain 08/24/2020  . Bruxism (teeth grinding) 08/24/2020  . Cervical syndrome 08/24/2020  . Disorder of autonomic nervous system 08/24/2020  . Elevated blood-pressure reading without diagnosis of hypertension 08/24/2020  . Fibromyositis 08/24/2020  . Temporomandibular joint disorder 08/24/2020  . Hypoglycemic reaction 08/21/2020  . Coronary atherosclerosis due to calcified coronary lesion 08/21/2020  . Hypoxia 07/23/2020  . Renal mass, left 07/06/2020  . Aspiration into airway 06/16/2020  . Paronychia of great toe, right 06/16/2020  . Acute kidney failure ([place]) 06/02/2020  . Abdominal distension 05/18/2020  . Chronic right shoulder pain 02/09/2020  . 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 disease ([place]) 01/26/2020  . 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 Recurrent headache 01/13/2020  . 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 Elevated alkaline phosphatase level 11/21/2019  . Hospital discharge follow-up 11/04/2019  . Cervical spondylosis without myelopathy 10/29/2019  . Pressure injury of skin 10/16/2019  . History of gastric bypass 10/13/2019  . COVID-19 virus infection 10/06/2019  . Preoperative evaluation of a medical condition to rule out surgical contraindications (TAR required) 09/21/2019  . Morbid obesity with BMI of 40.0-44.9, adult ([place]) 06/24/2019  . History of bariatric surgery 04/15/2019  . Cervicocranial syndrome 11/12/2018  . Depressive disorder  11/12/2018  . Fibromyalgia 06/05/2018  . Iron deficiency anemia 06/05/2018  . Enteritis, enteropathogenic E. coli 04/21/2018  . Paronychia of great toe of left foot 11/24/2017  . Hypertension 11/24/2017  . Cervico-occipital neuralgia 05/02/2017  . Low back pain 02/02/2017  . Hip pain, chronic 12/28/2015  . Pain in joint, ankle and foot 12/28/2015  . 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 Right hip pain 05/30/2014  . Vitamin D deficiency 05/30/2014  . Tachycardia 08/15/2013  . Diabetes mellitus type 2, uncontrolled ([place]) 08/15/2013  . 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  . Morbid obesity ([place]) 06/09/2012  . Anxiety   . Headache, common migraine, intractable   . Hyperlipidemia   . Hypothyroid   . OSA (obstructive sleep apnea) 02/05/2012    Past Surgical History:  Procedure Laterality Date  . COLONOSCOPY WITH PROPOFOL N/A 10/21/2020   Procedure: COLONOSCOPY WITH PROPOFOL;  Surgeon: Virgel Manifold, MD;  Location: [facility] ENDOSCOPY;  Service: Endoscopy;  Laterality: N/A;  . DILATION AND CURETTAGE OF UTERUS  2011  . gastric bypass    . OVARIAN CYST REMOVAL    . ROTATOR CUFF REPAIR     right  . SHOULDER ARTHROSCOPY WITH SUBACROMIAL DECOMPRESSION AND OPEN ROTATOR C Right 07/19/2020   Procedure: Right shoulder arthroscopic vs mini-open rotator cuff repair vs Regeneten patch application, subacomial dcompression, and biceps tenodesis;  Surgeon: Leim Fabry, MD;  Location: [facility] ORS;  Service: Orthopedics;  Laterality: Right;    Prior to Admission medications   Medication Sig Start Date End Date Taking? Authorizing Provider  amitriptyline (ELAVIL) 25 MG tablet TAKE 1 TABLET BY MOUTH EVERYDAY AT BEDTIME Patient taking differently: Take 25 mg by mouth at bedtime. 11/01/20  Yes [name], MD  atorvastatin (LIPITOR) 20 MG tablet Take 1 tablet (20 mg total) by mouth daily. 09/07/20  Yes [name], MD  buPROPion (WELLBUTRIN XL) 150 MG 24 hr tablet Take 450 mg by mouth daily.  05/12/18  Yes [provider]  cyanocobalamin (,VITAMIN B-12,) 1000 MCG/ML injection INJECT 1 ML (1,000 MCG TOTAL) INTO THE MUSCLE ONCE A WEEK. FOR 4 WEEKS, THEN MONTHLY THEREAFTER 05/07/20  Yes [name], MD  cyclobenzaprine (FLEXERIL) 10 MG tablet Take 5-10 mg by mouth 3 (three) times daily as needed for muscle spasms.  05/21/18  Yes [provider]  escitalopram (LEXAPRO) 20 MG tablet Take 20 mg by mouth daily.  02/27/17  Yes [provider]  fluticasone (FLONASE) 50 MCG/ACT nasal spray SPRAY 2 SPRAYS INTO EACH NOSTRIL EVERY DAY Patient taking differently: Place 2 sprays into both nostrils daily. 05/27/20  Yes [name], MD  furosemide (LASIX) 20 MG tablet TAKE 1 TABLET (20 MG TOTAL) BY MOUTH DAILY. AS NEEDED FOR FLUID RETENTION 08/17/20  Yes [name], MD  HORIZANT 600 MG TBCR Take 600 mg by mouth 2 (two) times daily.  12/24/19  Yes [provider]  Insulin Glargine (BASAGLAR KWIKPEN) 100 UNIT/ML SOPN Inject 0.5 mLs (50 Units total) into the skin daily. Patient taking differently: Inject 33 Units into the skin at bedtime. 12/11/19  Yes [name], MD  lamoTRIgine (LAMICTAL) 150 MG tablet Take 150 mg by mouth daily.   Yes [provider]  levothyroxine (SYNTHROID) 50 MCG tablet Take 1 tablet (50 mcg total) by mouth daily. 01/26/20  Yes [name], MD  lipase/protease/amylase (CREON) 36000 UNITS CPEP capsule Take 2 capsules (72,000 Units total) by mouth 3 (three) times daily before meals AND 1  capsule (36,000 Units total) with snacks. 10/07/20 12/07/20 Yes Tahiliani, Lennette Bihari, MD  losartan (COZAAR) 25 MG tablet Take 1 tablet by mouth daily. 09/28/20 09/28/21 Yes [provider]  metoprolol tartrate (LOPRESSOR) 25 MG tablet Take 1 tablet (25 mg total) by mouth 2 (two) times daily. 06/15/20  Yes [name], MD  NOVOLOG FLEXPEN 100 UNIT/ML FlexPen INJECT 20 UNITS INTO THE SKIN 3 TIMES DAILY WITH MEALS Patient taking differently: Inject 5-20 Units into the skin 3 (three) times daily with meals. Inject 15 units in the morning, 20 units at lunch and 5-10 units with dinner 05/11/20  Yes [name], MD  oxyCODONE (ROXICODONE) 5 MG immediate release tablet Take 1 tablet (5 mg total) by mouth every 8 (eight) hours as needed. 11/26/20 11/26/21 Yes Hessie Knows, MD  acetaminophen  (TYLENOL) 500 MG tablet Take 2 tablets (1,000 mg total) by mouth every 8 (eight) hours. 07/19/20 07/19/21  Leim Fabry, MD  albuterol (VENTOLIN HFA) 108 (90 Base) MCG/ACT inhaler Inhale 2 puffs into the lungs every 6 (six) hours as needed for wheezing or shortness of breath. 10/02/19   Terald Sleeper, PA-C  aspirin-acetaminophen-caffeine (EXCEDRIN MIGRAINE) [id number] MG tablet Take 2 tablets by mouth every 6 (six) hours as needed for headache.    [provider]  azelastine (ASTELIN) 0.1 % nasal spray Place 2 sprays into both nostrils daily as needed for rhinitis or allergies.  12/22/19   [provider]  blood glucose meter kit and supplies KIT Dispense based on patient and insurance preference. Use up to four times daily as directed. (FOR ICD-E11.21) 08/22/16   [name], MD  CLINPRO 5000 1.1 % PSTE Place 1 application onto teeth at bedtime.  06/15/20   [provider]  Continuous Blood Gluc Receiver (FREESTYLE LIBRE 14 DAY READER) DEVI 1 applicator by Does not apply route 4 (four) times daily. 11/24/17   [name], MD  Continuous Blood Gluc Sensor (FREESTYLE LIBRE 14 DAY SENSOR) MISC USE TO CHECK BLOOD SUGAR FOUR TIMES A DAY AS NEEDED 09/28/20   [name], MD  diphenhydrAMINE (BENADRYL) 25 MG tablet Take 25 mg by mouth every 6 (six) hours as needed for allergies.    [provider]  diphenoxylate-atropine (LOMOTIL) 2.5-0.025 MG tablet Take 1 tablet by mouth 4 (four) times daily as needed for diarrhea or loose stools. 08/06/20   [name], MD  EPINEPHrine 0.3 mg/0.3 mL IJ SOAJ injection Inject 0.3 mg into the muscle as needed for anaphylaxis.  06/25/10   [provider]  Insulin Pen Needle (BD PEN NEEDLE NANO U/F) 32G X 4 MM MISC Please specify directions, refills and quantity 08/22/18   [name], MD  Lancet Device MISC Test blood sugar two times daily 07/29/12   [name], MD  loperamide (IMODIUM) 2 MG capsule Take 2 capsules (4  mg total) by mouth as needed for diarrhea or loose stools. 10/25/19   Amin, Jeanella Flattery, MD  LORazepam (ATIVAN) 0.5 MG tablet Take 0.5-1 mg by mouth daily as needed for anxiety.     [provider]  meclizine (ANTIVERT) 25 MG tablet Take 1 tablet (25 mg total) by mouth 3 (three) times daily as needed for dizziness. 06/16/15   [name], MD  Melatonin 10 MG TABS Take 10 mg by mouth at bedtime as needed (sleep).     [provider]  neomycin-polymyxin b-dexamethasone (MAXITROL) 3.5-10000-0.1 SUSP Place 1 drop into both eyes 2 (  two) times daily. 11/25/20   [provider]  NURTEC 75 MG TBDP Take 1 tablet by mouth daily. 10/06/20   [provider]  nystatin (MYCOSTATIN/NYSTOP) powder APPLY 1 APPLICATION TO AFFECTED AREA TWICE A DAY Patient taking differently: Apply 1 application topically daily as needed (irritation). 06/15/20   [name], MD  OnabotulinumtoxinA (BOTOX IJ) Inject as directed every 3 (three) months. For migraine    [provider]  ondansetron (ZOFRAN ODT) 4 MG disintegrating tablet Take 1 tablet (4 mg total) by mouth every 8 (eight) hours as needed for nausea or vomiting. 07/19/20   Leim Fabry, MD  polyethylene glycol-electrolytes (NULYTELY) 420 g solution Prepare according to package instructions. Starting at 5:00 PM: Drink one 8 oz glass of mixture every 15 minutes until you finish half of the jug. Five hours prior to procedure, drink 8 oz glass of mixture every 15 minutes until it is all gone. Make sure you do not drink anything 4 hours prior to your procedure. Patient not taking: No sig reported 08/25/20   Virgel Manifold, MD  promethazine (PHENERGAN) 25 MG tablet Take 25 mg by mouth every 6 (six) hours as needed for nausea or vomiting.  07/01/20   [provider]  Rimegepant Sulfate (NURTEC PO) Take by mouth as needed.    [provider]  SYRINGE-NEEDLE, DISP, 3 ML (BD INTEGRA SYRINGE) 25G X 1" 3 ML MISC FOR  USE WITH B12 INJECTIONS WEEKLY/MONTHLY 08/01/18   [name], MD  Vitamin D, Ergocalciferol, (DRISDOL) 1.25 MG (50000 UNIT) CAPS capsule TAKE 1 CAPSULE BY MOUTH ONE TIME PER WEEK Patient taking differently: Take 50,000 Units by mouth every 7 (seven) days. 04/12/20   [name], MD    Allergies Amoxicillin, Avocado, Clarithromycin, Clarithromycin, Honey, Other, Sulfa antibiotics, Sulfa drugs cross reactors, Watermelon concentrate, Insulin detemir, Tape, and Victoza [liraglutide]  Family History  Problem Relation Age of Onset  . Diabetes Mother   . Hypertension Mother   . Hypertension Father   . Diabetes Father   . Depression Other        strong hx of  . Breast cancer Paternal Grandmother 51  . Breast cancer Maternal Grandmother   . Cancer Neg Hx     Social History Social History   Tobacco Use  . Smoking status: Former Smoker    Packs/day: 1.00    Years: 4.00    Pack years: 4.00    Types: Cigarettes    Quit date: 02/05/1996    Years since quitting: 24.8  . Smokeless tobacco: Never Used  Vaping Use  . Vaping Use: Never used  Substance Use Topics  . Alcohol use: No  . Drug use: No    Review of Systems Constitutional: No fever/chills Eyes: No visual changes. Cardiovascular: Denies chest pain. Respiratory: Denies shortness of breath. Gastrointestinal: No abdominal pain.   Genitourinary: Negative for dysuria. Musculoskeletal: Negative for back pain.  See HPI regarding right forearm and hand Skin: Negative for rash. Neurological: Negative for headaches except a migraine earlier which has resolved and is not unusual for her, areas of focal weakness or numbness.    ____________________________________________   PHYSICAL EXAM:  VITAL SIGNS: ED Triage Vitals  Enc Vitals Group     BP      Pulse      Resp      Temp      Temp src      SpO2      Weight  Height      Head Circumference      Peak Flow      Pain Score      Pain Loc      Pain Edu?       Excl. in [place]?     Constitutional: Alert and oriented.  Ambulatory but wincing in severe pain reporting severe pain in her right forearm. Eyes: Conjunctivae are normal. Head: Atraumatic. Nose: No congestion/rhinnorhea. Mouth/Throat: Mucous membranes are moist. Neck: No stridor.  Cardiovascular: Normal rate, regular rhythm. Grossly normal heart sounds.  Good peripheral circulation. Respiratory: Normal respiratory effort.  No retractions. Lungs CTAB. Gastrointestinal: Soft and nontender. No distention. Musculoskeletal: Right arm strong radial pulse.  Normal perfusion of the right hand.  It is in a splint.  No bleeding.  Splint left on.  Patient reports decreased sensation and fairly dense loss of sensation over the first second and third digit of the right hand.  Able to discriminate well in the fourth and fifth digit.  Reports severe pain in the right forearm. Neurologic:  Normal speech and language. No gross focal neurologic deficits are appreciated.  Skin:  Skin is warm, dry and intact. No rash noted. Psychiatric: Mood and affect are normal. Speech and behavior are normal.  ____________________________________________   LABS (all labs ordered are listed, but only abnormal results are displayed)  Labs Reviewed  CBG MONITORING, ED - Abnormal; Notable for the following components:      Result Value   Glucose-Capillary 235 (*)    All other components within normal limits   ____________________________________________  EKG   ____________________________________________  RADIOLOGY   ____________________________________________   PROCEDURES  Procedure(s) performed: None  Procedures  Critical Care performed: No  ____________________________________________   INITIAL IMPRESSION / ASSESSMENT AND PLAN / ED COURSE  Pertinent labs & imaging results that were available during my care of the patient were reviewed by me and considered in my medical decision making (see chart for  details).   Discussed case with Dr. Rudene Christians as soon as I evaluated her, he is coming to see her and advises to keep patient n.p.o., and we will provide pain relief and await his consultation with concern she may need to return to the OR with him.  She denies any interim symptoms after leaving the hospital other than increasing pain as well as numbness in the right hand.  She has evidence of good distal circulation but concerning neurologic exam of the right first through third digits of hand.    Patient taken to the OR with Dr. Rudene Christians  ____________________________________________   FINAL CLINICAL IMPRESSION(S) / ED DIAGNOSES  Final diagnoses:  Right hand paresthesia        Note:  This document was prepared using Dragon voice recognition software and may include unintentional dictation errors       Delman Kitten, MD 11/26/20 2119

## 2020-11-26 NOTE — Anesthesia Preprocedure Evaluation (Addendum)
Anesthesia Evaluation  Patient identified by MRN, date of birth, ID band Patient awake    Reviewed: Allergy & Precautions, NPO status , Patient's Chart, lab work & pertinent test results  History of Anesthesia Complications Negative for: history of anesthetic complications  Airway Mallampati: III   Neck ROM: Full    Dental  (+) Dental Advidsory Given,    Pulmonary neg shortness of breath, sleep apnea , neg COPD, neg recent URI, former smoker,    Pulmonary exam normal        Cardiovascular Exercise Tolerance: Good hypertension, (-) angina+ CAD  (-) Past MI and (-) Cardiac Stents Normal cardiovascular exam+ dysrhythmias (-) Valvular Problems/Murmurs Rhythm:Regular Rate:Normal     Neuro/Psych  Headaches, neg Seizures PSYCHIATRIC DISORDERS Anxiety Depression  Neuromuscular disease    GI/Hepatic negative GI ROS, Neg liver ROS,   Endo/Other  diabetesHypothyroidism Morbid obesity  Renal/GU CRFRenal disease  negative genitourinary   Musculoskeletal  (+) Arthritis , Fibromyalgia -  Abdominal   Peds negative pediatric ROS (+)  Hematology negative hematology ROS (+)   Anesthesia Other Findings .Marland KitchenPast Medical History: 04/30/2019: Abscess of right buttock No date: Anemia No date: Anxiety No date: COVID-19 virus infection 2010: Depression No date: Diabetes mellitus 10/13/2019: DKA, type 2 ([place]) No date: Endometriosis No date: Headache, common migraine, intractable No date: History of kidney stones No date: History of ovarian cyst     Comment:  left sided, caused by her endometriosis (possibly an               endometrioma), in her 65s. No date: Hyperlipidemia No date: Hypertension No date: Kidney stones     Comment:  left No date: Morbid obesity ([place])     Comment:  s/p gastric bypass 2010: Personal hx of gastric bypass No date: Rosacea No date: Vertigo   Reproductive/Obstetrics negative OB ROS                              Anesthesia Physical  Anesthesia Plan  ASA: III  Anesthesia Plan: General   Post-op Pain Management:  Regional for Post-op pain   Induction: Intravenous  PONV Risk Score and Plan: 3 and Ondansetron, Dexamethasone, Midazolam and Treatment may vary due to age or medical condition  Airway Management Planned: LMA and Oral ETT  Additional Equipment: None  Intra-op Plan:   Post-operative Plan: Extubation in OR  Informed Consent: I have reviewed the patients History and Physical, chart, labs and discussed the procedure including the risks, benefits and alternatives for the proposed anesthesia with the patient or authorized representative who has indicated his/her understanding and acceptance.       Plan Discussed with: CRNA, Anesthesiologist and Surgeon  Anesthesia Plan Comments:        Anesthesia Quick Evaluation

## 2020-11-26 NOTE — Op Note (Signed)
11/26/2020  12:13 PM  PATIENT:  Maria Bentley  49 y.o. female  PRE-OPERATIVE DIAGNOSIS:  Displaced fracture of distal end of right radius S52.501A Other closed fracture of distal end of right ulna, initial encounter S52.691A  POST-OPERATIVE DIAGNOSIS:  Displaced fracture of distal end of right radius S52.501A Other closed fracture of distal end of right ulna, initial encounter S52.691A  PROCEDURE:  Procedure(s): Open reduction internal fixation, right distal radius (Right)  SURGEON: Laurene Footman, MD  ASSISTANTS: None  ANESTHESIA:   general  EBL:  Total I/O In: 800 [I.V.:800] Out: 3 [Blood:3]  BLOOD ADMINISTERED:none  DRAINS: none   LOCAL MEDICATIONS USED:  NONE  SPECIMEN:  No Specimen  DISPOSITION OF SPECIMEN:  N/A  COUNTS:  YES  TOURNIQUET:   Total Tourniquet Time Documented: Upper Arm (Right) - 19 minutes Total: Upper Arm (Right) - 19 minutes   IMPLANTS: Short narrow DVR plate with multiple smooth pegs and screws  DICTATION: .Dragon Dictation patient was brought to the operating room and after adequate anesthesia was obtained the right arm was prepped and draped in usual sterile fashion.  After patient identification and timeout procedures were completed tourniquet was raised and fingertrap traction applied at the end of the table.  Volar approach was made centered over the FCR tendon with the tendon sheath incised the tendon retracted radially protect the radial vessels.  Deep fascia was incised and the pronator was identified and elevated off the radial border of the distal and proximal fragments.  A Freer elevator was used to try to manipulate the fragments to get better alignment.  A volar plate was then applied with a distal first technique filling the distal pegs making certain that there is no penetration into the joint after these were placed the plate was brought down to the shaft and volar tilt was restored with near anatomic alignment in AP lateral  and oblique views.  After the 3 cortical screws were placed in retightened traction was removed and the fracture was stable to range of motion the tourniquet is let down and hemostasis checked with electrocautery.  Skin was closed with 4-0 Vicryl subcutaneously and 4-0 nylon for the skin followed by Xeroform 4 x 4's web roll volar splint and Ace wrap  PLAN OF CARE: Discharge to home after PACU  PATIENT DISPOSITION:  PACU - hemodynamically stable.

## 2020-11-26 NOTE — Interval H&P Note (Signed)
History and Physical Interval Note:  11/26/2020 6:19 PM  Maria Bentley  has presented today for surgery, with the diagnosis of S/P ORIF Wrist.  The various methods of treatment have been discussed with the patient and family. After consideration of risks, benefits and other options for treatment, the patient has consented to  Procedure(s): CARPAL TUNNEL RELEASE (Right) as a surgical intervention.  The patient's history has been reviewed, patient examined, no change in status, stable for surgery.  I have reviewed the patient's chart and labs.  Questions were answered to the patient's satisfaction.     Hessie Knows

## 2020-11-26 NOTE — Discharge Instructions (Addendum)
Keep arm elevated is much as possible through the weekend. Work on finger motion is much as you can trying to make a fist. Pain medicine as directed. Keep dressing clean and dry.   AMBULATORY SURGERY  DISCHARGE INSTRUCTIONS   1) The drugs that you were given will stay in your system until tomorrow so for the next 24 hours you should not:  A) Drive an automobile B) Make any legal decisions C) Drink any alcoholic beverage   2) You may resume regular meals tomorrow.  Today it is better to start with liquids and gradually work up to solid foods.  You may eat anything you prefer, but it is better to start with liquids, then soup and crackers, and gradually work up to solid foods.   3) Please notify your doctor immediately if you have any unusual bleeding, trouble breathing, redness and pain at the surgery site, drainage, fever, or pain not relieved by medication.    4) Additional Instructions:        Please contact your physician with any problems or Same Day Surgery at [phone], Monday through Friday 6 am to 4 pm, or [place] at [facility] number at [phone].

## 2020-11-26 NOTE — OR Nursing (Signed)
Per patient okay to remove free style from arm for OR case. Device removed. Patient states she has new one at home.

## 2020-11-26 NOTE — Anesthesia Preprocedure Evaluation (Addendum)
Anesthesia Evaluation  Patient identified by MRN, date of birth, ID band Patient awake  General Assessment Comment:S/p wrist surgery today; needs to go back for revision.  Reviewed: Allergy & Precautions, NPO status , Patient's Chart, lab work & pertinent test results  History of Anesthesia Complications Negative for: history of anesthetic complications  Airway Mallampati: III  TM Distance: >3 FB Neck ROM: Full    Dental  (+) Missing, Teeth Intact,    Pulmonary neg shortness of breath, sleep apnea , neg COPD, neg recent URI, former smoker,    Pulmonary exam normal breath sounds clear to auscultation       Cardiovascular Exercise Tolerance: Good hypertension, (-) angina+ CAD  (-) Past MI and (-) Cardiac Stents Normal cardiovascular exam+ dysrhythmias (-) Valvular Problems/Murmurs Rhythm:Regular Rate:Normal  TTE 2020: 1. Left ventricular ejection fraction, by visual estimation, is 60 to  65%. The left ventricle has normal function. There is no left ventricular  hypertrophy.  2. Left ventricular diastolic parameters are consistent with Grade I  diastolic dysfunction (impaired relaxation).  3. Global right ventricle has mildly reduced systolic function.The right  ventricular size is normal.  4. Left atrial size was normal.  5. Right atrial size was normal.  6. The mitral valve is normal in structure. No evidence of mitral valve  regurgitation. No evidence of mitral stenosis.  7. The tricuspid valve is normal in structure. Tricuspid valve  regurgitation is trivial.  8. The aortic valve is tricuspid. Aortic valve regurgitation is not  visualized. No evidence of aortic valve sclerosis or stenosis.  9. The pulmonic valve was grossly normal. Pulmonic valve regurgitation is  trivial.  10. Normal LV systolic function; grade 1 diastolic dysfunction; mild RV  dysfunction.    Neuro/Psych  Headaches, neg Seizures PSYCHIATRIC  DISORDERS Anxiety Depression  Neuromuscular disease    GI/Hepatic negative GI ROS, Neg liver ROS,   Endo/Other  diabetesHypothyroidism Morbid obesity  Renal/GU CRFRenal disease  negative genitourinary   Musculoskeletal  (+) Arthritis , Fibromyalgia -  Abdominal (+) + obese,   Peds negative pediatric ROS (+)  Hematology negative hematology ROS (+)   Anesthesia Other Findings .Marland KitchenPast Medical History: 04/30/2019: Abscess of right buttock No date: Anemia No date: Anxiety No date: COVID-19 virus infection 2010: Depression No date: Diabetes mellitus 10/13/2019: DKA, type 2 ([place]) No date: Endometriosis No date: Headache, common migraine, intractable No date: History of kidney stones No date: History of ovarian cyst     Comment:  left sided, caused by her endometriosis (possibly an               endometrioma), in her 7s. No date: Hyperlipidemia No date: Hypertension No date: Kidney stones     Comment:  left No date: Morbid obesity ([place])     Comment:  s/p gastric bypass 2010: Personal hx of gastric bypass No date: Rosacea No date: Vertigo   Reproductive/Obstetrics negative OB ROS                            Anesthesia Physical  Anesthesia Plan  ASA: III  Anesthesia Plan: General   Post-op Pain Management:    Induction: Intravenous  PONV Risk Score and Plan: 3 and Ondansetron, Dexamethasone, Midazolam and Treatment may vary due to age or medical condition  Airway Management Planned: LMA  Additional Equipment: None  Intra-op Plan:   Post-operative Plan: Extubation in OR  Informed Consent: I have reviewed the patients History and Physical,  chart, labs and discussed the procedure including the risks, benefits and alternatives for the proposed anesthesia with the patient or authorized representative who has indicated his/her understanding and acceptance.     Dental advisory given  Plan Discussed with: CRNA, Anesthesiologist and  Surgeon  Anesthesia Plan Comments: (Discussed risks of anesthesia with patient, including PONV, sore throat, lip/dental damage. Rare risks discussed as well, such as cardiorespiratory and neurological sequelae. Patient understands.  Patient last ate minestrone soup at 1:15pm. Will proceed with case after 8hr NPO time.)       Anesthesia Quick Evaluation

## 2020-11-26 NOTE — Transfer of Care (Signed)
Immediate Anesthesia Transfer of Care Note  Patient: Maria Bentley  Procedure(s) Performed: Open reduction internal fixation, right distal radius (Right Wrist)  Patient Location: PACU  Anesthesia Type:General  Level of Consciousness: awake  Airway & Oxygen Therapy: Patient Spontanous Breathing  Post-op Assessment: Report given to RN  Post vital signs: stable  Last Vitals:  Vitals Value Taken Time  BP    Temp    Pulse 81 11/26/20 1223  Resp 16 11/26/20 1223  SpO2 94 % 11/26/20 1223  Vitals shown include unvalidated device data.  Last Pain:  Vitals:   11/26/20 0930  TempSrc: Oral  PainSc: 6          Complications: No complications documented.

## 2020-11-27 LAB — CBG MONITORING, ED: Glucose-Capillary: 189 mg/dL — ABNORMAL HIGH (ref 70–99)

## 2020-11-27 MED ORDER — ONDANSETRON HCL 4 MG/2ML IJ SOLN: 4.0000 mg | Freq: Four times a day (QID) | INTRAMUSCULAR | Status: DC | PRN

## 2020-11-27 MED ORDER — ONDANSETRON HCL 4 MG PO TABS: 4.0000 mg | ORAL_TABLET | Freq: Four times a day (QID) | ORAL | Status: DC | PRN

## 2020-11-27 MED ORDER — OXYCODONE HCL 5 MG PO TABS
5.0000 mg | ORAL_TABLET | Freq: Once | ORAL | Status: AC | PRN
  Administered 2020-11-27: 5 mg via ORAL

## 2020-11-27 MED ORDER — METOCLOPRAMIDE HCL 10 MG PO TABS: 5.0000 mg | ORAL_TABLET | Freq: Three times a day (TID) | ORAL | Status: DC | PRN

## 2020-11-27 MED ORDER — OXYCODONE HCL 5 MG PO TABS
ORAL_TABLET | ORAL | Status: AC
  Filled 2020-11-27: qty 1

## 2020-11-27 MED ORDER — METOCLOPRAMIDE HCL 5 MG/ML IJ SOLN: 5.0000 mg | Freq: Three times a day (TID) | INTRAMUSCULAR | Status: DC | PRN

## 2020-11-27 MED ORDER — FENTANYL CITRATE (PF) 100 MCG/2ML IJ SOLN
INTRAMUSCULAR | Status: AC
  Administered 2020-11-27: 50 ug via INTRAVENOUS
  Filled 2020-11-27: qty 2

## 2020-11-27 MED ORDER — ACETAMINOPHEN 10 MG/ML IV SOLN: 1000.0000 mg | Freq: Once | INTRAVENOUS | Status: DC | PRN

## 2020-11-27 MED ORDER — ONDANSETRON HCL 4 MG/2ML IJ SOLN: 4.0000 mg | Freq: Once | INTRAMUSCULAR | Status: DC | PRN

## 2020-11-27 MED ORDER — OXYCODONE HCL 5 MG/5ML PO SOLN: 5.0000 mg | Freq: Once | ORAL | Status: AC | PRN

## 2020-11-27 MED ORDER — SODIUM CHLORIDE 0.9 % IV SOLN: INTRAVENOUS | Status: DC

## 2020-11-27 MED ORDER — FENTANYL CITRATE (PF) 100 MCG/2ML IJ SOLN
25.0000 ug | INTRAMUSCULAR | Status: DC | PRN
  Administered 2020-11-27: 50 ug via INTRAVENOUS

## 2020-11-27 NOTE — Discharge Instructions (Addendum)
AMBULATORY SURGERY  DISCHARGE INSTRUCTIONS   1) The drugs that you were given will stay in your system until tomorrow so for the next 24 hours you should not:  A) Drive an automobile B) Make any legal decisions C) Drink any alcoholic beverage   2) You may resume regular meals tomorrow.  Today it is better to start with liquids and gradually work up to solid foods.  You may eat anything you prefer, but it is better to start with liquids, then soup and crackers, and gradually work up to solid foods.   3) Please notify your doctor immediately if you have any unusual bleeding, trouble breathing, redness and pain at the surgery site, drainage, fever, or pain not relieved by medication.    4) Additional Instructions:        Please contact your physician with any problems or Same Day Surgery at [phone], Monday through Friday 6 am to 4 pm, or [place] [facility] number at [phone].Your dressing is been placed looser than the earlier 1 to avoid constriction on the nerve.  If you feel like you need more support you can take off the entire bandage put Band-Aids over the incisions and put your wrist brace back on by OB/GYN tolerate just wearing the Ace wrap through the weekend.  Call the office Monday to let us know how your sensation is doing.  Do not worry about numbness through the night that is from local anesthetic at the time of surgery try to move your fingers all you can throughout the weekend.  Call the office this weekend if you are having further problems we have an answering service can answer any problems

## 2020-11-27 NOTE — Op Note (Signed)
11/26/2020  12:04 AM  PATIENT:  Maria Bentley  49 y.o. female  PRE-OPERATIVE DIAGNOSIS:  S/P ORIF right wrist acute carpal tunnel syndrome  POST-OPERATIVE DIAGNOSIS:  S/P ORIF right wrist acute carpal tunnel syndrome  PROCEDURE:  Procedure(s): CARPAL TUNNEL RELEASE (Right)  SURGEON: Laurene Footman, MD  ASSISTANTS: None  ANESTHESIA:   general  EBL:  Total I/O In: 500 [I.V.:500] Out: -   BLOOD ADMINISTERED:none  DRAINS: none   LOCAL MEDICATIONS USED:  MARCAINE     SPECIMEN:  No Specimen  DISPOSITION OF SPECIMEN:  N/A  COUNTS:  YES  TOURNIQUET: 17 minutes at 250 mmHg  IMPLANTS: None  DICTATION: .Dragon Dictation patient was brought to the operating room and after adequate anesthesia was obtained the right arm was prepped and draped in usual sterile fashion after patient identification and timeout procedure were completed tourniquet was raised and the carpal tunnel release was carried out through approximately 2 cm incision in line with the ring metacarpal.  Skin skin and subcutaneous tissue were spread and transverse carpal ligament identified there was quite a bit of thenar musculature overlying the transverse carpal ligament this was elevated radially.  A small incision made and a vascular hemostat placed deep to protect the underlying structures release carried out distally to there is fat noted around the nerve and proximally where the level of the wrist flexion crease there appeared to be about a centimeter area of compression presumably secondary to tendon swelling.  After release there is good vascular blush to the nerve.  The wound was irrigated and infiltrated 10 cc of half percent Sensorcaine and closed with simple interrupted 4-0 nylon.  The prior incision was opened at the wrist to make sure there is no compression on the nerve more proximally the skin was spread and the plate was exposed thoroughly irrigated feeling along the plate on either side with a Valora Corporal  there not appear to be a constricted tissues found under it that might tether the nerve going superficially the nerve was visualized and there did not appear to be any pressure or bands that might be causing nerve pressure so the wound was closed with 3-0 Vicryl subcutaneously and 4-0 nylon in a simple interrupted fashion.  Xeroform 4 x 4's web roll and an Ace wrap were applied loosely to prevent compressed any potential compression by tight bandages  PLAN OF CARE: Discharge to home after PACU  PATIENT DISPOSITION:  PACU - hemodynamically stable.

## 2020-11-27 NOTE — Transfer of Care (Signed)
Immediate Anesthesia Transfer of Care Note  Patient: Maria Bentley  Procedure(s) Performed: CARPAL TUNNEL RELEASE (Right )  Patient Location: PACU  Anesthesia Type:General  Level of Consciousness: awake, alert  and oriented  Airway & Oxygen Therapy: Patient Spontanous Breathing  Post-op Assessment: Report given to RN and Post -op Vital signs reviewed and stable  Post vital signs: Reviewed and stable  Last Vitals:  Vitals Value Taken Time  BP 138/81 11/27/20 0003  Temp    Pulse 96 11/27/20 0005  Resp 11 11/27/20 0005  SpO2 85 % 11/27/20 0005  Vitals shown include unvalidated device data.  Last Pain:  Vitals:   11/26/20 2048  TempSrc:   PainSc: 7          Complications: No complications documented.

## 2020-11-27 NOTE — Anesthesia Postprocedure Evaluation (Signed)
Anesthesia Post Note  Patient: Maria Bentley  Procedure(s) Performed: CARPAL TUNNEL RELEASE (Right )  Patient location during evaluation: PACU Anesthesia Type: General Level of consciousness: awake and alert Pain management: pain level controlled Vital Signs Assessment: post-procedure vital signs reviewed and stable Respiratory status: spontaneous breathing, nonlabored ventilation, respiratory function stable and patient connected to nasal cannula oxygen Cardiovascular status: blood pressure returned to baseline and stable Postop Assessment: no apparent nausea or vomiting Anesthetic complications: no   No complications documented.   Last Vitals:  Vitals:   11/27/20 0033 11/27/20 0048  BP: 115/73 (!) 141/88  Pulse: 92 93  Resp: (!) 30 (!) 25  Temp:  36.6 C  SpO2: 100% 97%    Last Pain:  Vitals:   11/27/20 0048  TempSrc:   PainSc: 1                  Arita Miss

## 2020-11-28 ENCOUNTER — Encounter: Payer: Self-pay | Admitting: Orthopedic Surgery

## 2020-11-29 ENCOUNTER — Encounter: Payer: Self-pay | Admitting: Orthopedic Surgery

## 2020-12-09 ENCOUNTER — Other Ambulatory Visit: Payer: Self-pay | Admitting: Internal Medicine

## 2020-12-13 ENCOUNTER — Other Ambulatory Visit: Payer: Self-pay | Admitting: Internal Medicine

## 2020-12-14 ENCOUNTER — Telehealth: Payer: BC Managed Care – PPO | Admitting: Gastroenterology

## 2020-12-15 ENCOUNTER — Ambulatory Visit: Payer: BC Managed Care – PPO | Admitting: Internal Medicine

## 2020-12-22 ENCOUNTER — Other Ambulatory Visit: Payer: Self-pay | Admitting: Internal Medicine

## 2021-01-13 ENCOUNTER — Other Ambulatory Visit: Payer: Self-pay | Admitting: Internal Medicine

## 2021-01-24 ENCOUNTER — Other Ambulatory Visit: Payer: Self-pay | Admitting: Internal Medicine

## 2021-01-25 ENCOUNTER — Telehealth: Payer: Self-pay

## 2021-01-25 DIAGNOSIS — E7800 Pure hypercholesterolemia, unspecified: Secondary | ICD-10-CM

## 2021-01-25 DIAGNOSIS — E11649 Type 2 diabetes mellitus with hypoglycemia without coma: Secondary | ICD-10-CM

## 2021-01-25 NOTE — Telephone Encounter (Signed)
Labs placed for lab appt this week

## 2021-01-26 ENCOUNTER — Ambulatory Visit: Payer: Self-pay | Admitting: Podiatry

## 2021-01-27 ENCOUNTER — Other Ambulatory Visit (INDEPENDENT_AMBULATORY_CARE_PROVIDER_SITE_OTHER): Payer: BC Managed Care – PPO

## 2021-01-27 ENCOUNTER — Other Ambulatory Visit: Payer: Self-pay

## 2021-01-27 DIAGNOSIS — E7800 Pure hypercholesterolemia, unspecified: Secondary | ICD-10-CM | POA: Diagnosis not present

## 2021-01-27 DIAGNOSIS — E11649 Type 2 diabetes mellitus with hypoglycemia without coma: Secondary | ICD-10-CM | POA: Diagnosis not present

## 2021-01-27 LAB — COMPREHENSIVE METABOLIC PANEL
ALT: 22 U/L (ref 0–35)
AST: 15 U/L (ref 0–37)
Albumin: 2.8 g/dL — ABNORMAL LOW (ref 3.5–5.2)
Alkaline Phosphatase: 144 U/L — ABNORMAL HIGH (ref 39–117)
BUN: 20 mg/dL (ref 6–23)
CO2: 27 mEq/L (ref 19–32)
Calcium: 8.2 mg/dL — ABNORMAL LOW (ref 8.4–10.5)
Chloride: 106 mEq/L (ref 96–112)
Creatinine, Ser: 1.15 mg/dL (ref 0.40–1.20)
GFR: 55.54 mL/min — ABNORMAL LOW (ref >60.00)
Glucose, Bld: 109 mg/dL — ABNORMAL HIGH (ref 70–99)
Potassium: 3.8 mEq/L (ref 3.5–5.1)
Sodium: 139 mEq/L (ref 135–145)
Total Bilirubin: 0.2 mg/dL (ref 0.2–1.2)
Total Protein: 5.6 g/dL — ABNORMAL LOW (ref 6.0–8.3)

## 2021-01-27 LAB — LIPID PANEL
Cholesterol: 79 mg/dL (ref 0–200)
HDL: 38.4 mg/dL — ABNORMAL LOW (ref >39.00)
LDL Cholesterol: 24 mg/dL (ref 0–99)
NonHDL: 40.78
Total CHOL/HDL Ratio: 2
Triglycerides: 85 mg/dL (ref 0.0–149.0)
VLDL: 17 mg/dL (ref 0.0–40.0)

## 2021-01-27 LAB — HEMOGLOBIN A1C: Hgb A1c MFr Bld: 11 % — ABNORMAL HIGH (ref 4.6–6.5)

## 2021-01-28 ENCOUNTER — Telehealth: Payer: BC Managed Care – PPO | Admitting: Internal Medicine

## 2021-01-28 DIAGNOSIS — Z0289 Encounter for other administrative examinations: Secondary | ICD-10-CM

## 2021-01-31 ENCOUNTER — Other Ambulatory Visit: Payer: Self-pay | Admitting: Internal Medicine

## 2021-02-16 ENCOUNTER — Other Ambulatory Visit: Payer: Self-pay | Admitting: Internal Medicine

## 2021-03-16 ENCOUNTER — Telehealth: Payer: Self-pay | Admitting: Internal Medicine

## 2021-03-16 NOTE — Telephone Encounter (Signed)
April from [employer], [phone]. She wanted to know if the office received a faxed for a Glucose monitor for patient.

## 2021-03-17 NOTE — Telephone Encounter (Signed)
ORDER FOR DEX COM SIGNED

## 2021-03-17 NOTE — Telephone Encounter (Signed)
Form is in quick sign folder.

## 2021-03-17 NOTE — Telephone Encounter (Signed)
faxed

## 2021-04-21 ENCOUNTER — Other Ambulatory Visit: Payer: Self-pay | Admitting: Gastroenterology

## 2021-04-21 ENCOUNTER — Other Ambulatory Visit: Payer: Self-pay | Admitting: Internal Medicine

## 2021-05-01 IMAGING — CT CT PELVIS WITH CONTRAST
2 of 3 series · 16 of 46 positions shown, 18 images · IV contrast (APPLIED)
Comparison: CT scan of March 29, 2007.

CLINICAL DATA: Abscess of right buttock.

EXAM:
CT PELVIS WITH CONTRAST
TECHNIQUE: Multidetector CT imaging of the pelvis was performed using the
standard protocol following the bolus administration of intravenous
contrast.
CONTRAST:  100mL OMNIPAQUE IOHEXOL 300 MG/ML  SOLN

[Series 2: routine abd/pel with · axial · 0.98mm/px · z∈[-1064,-764]mm · 13 of 70 slices shown, 15 images]
[im 5/70  soft-tissue]
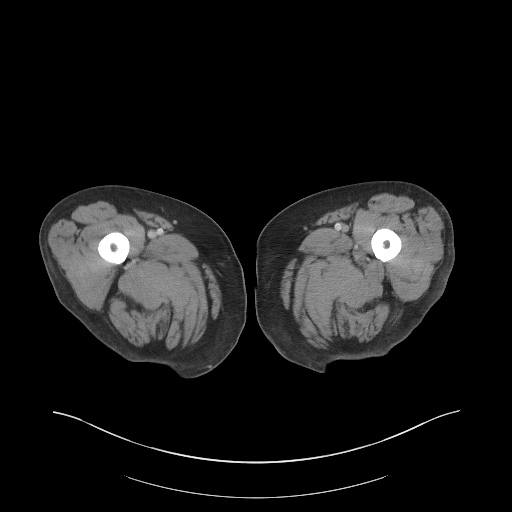
[im 5/70  bone]
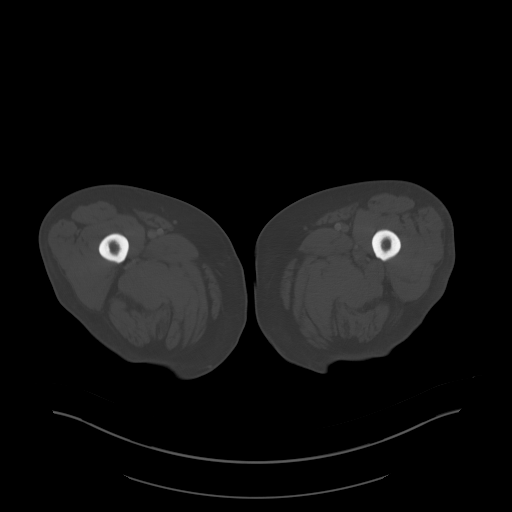
[im 9/70  soft-tissue]
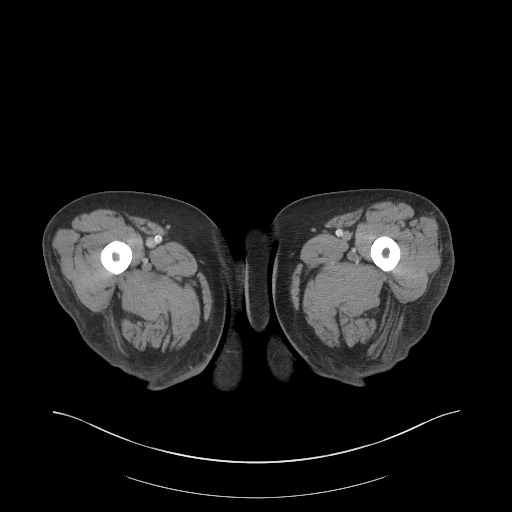
[im 14/70  soft-tissue]
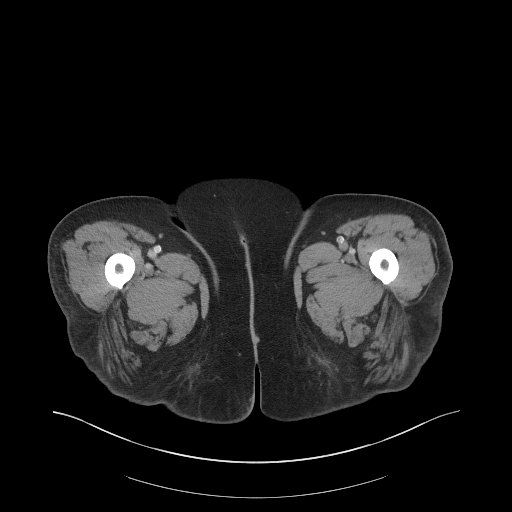
[im 21/70  soft-tissue]
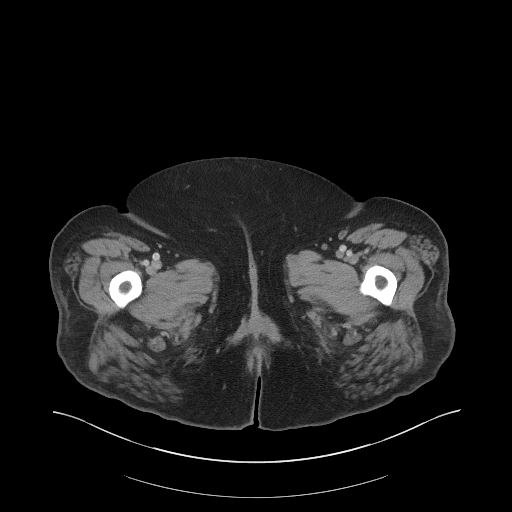
[im 25/70  soft-tissue]
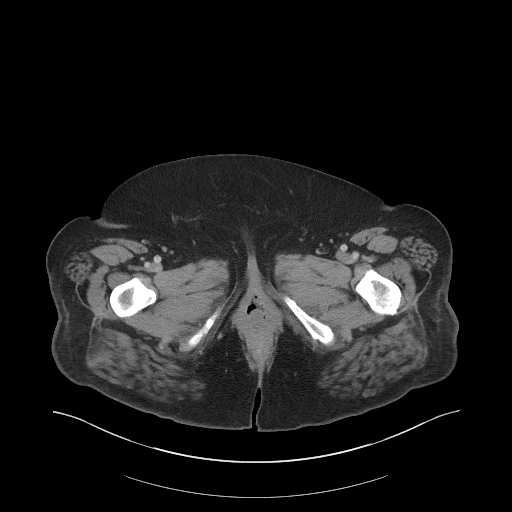
[im 29/70  soft-tissue]
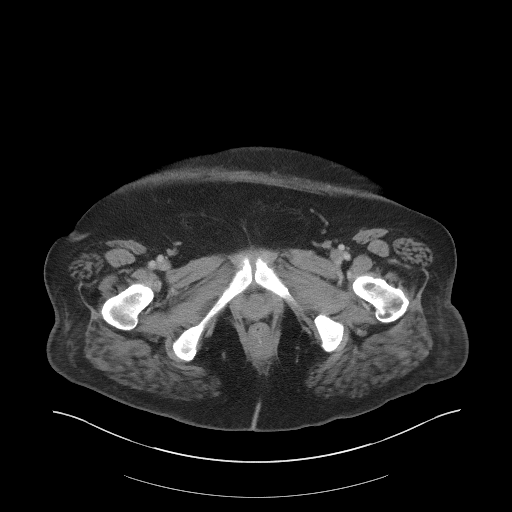
[im 36/70  soft-tissue]
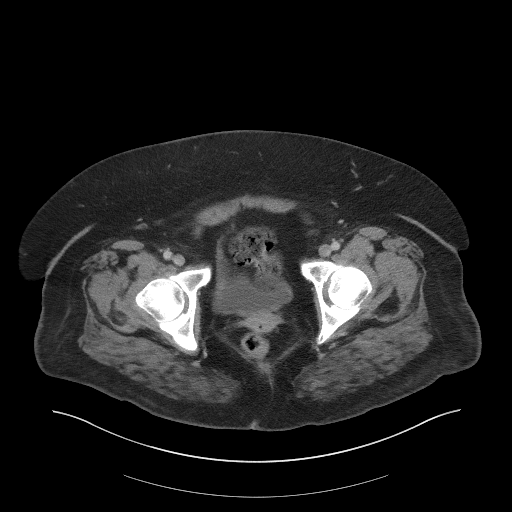
[im 41/70  soft-tissue]
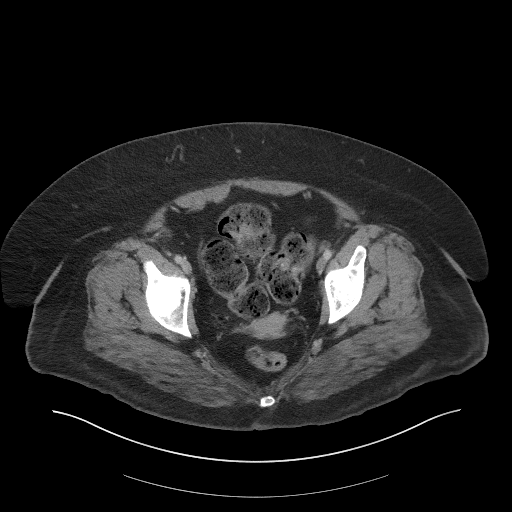
[im 45/70  soft-tissue]
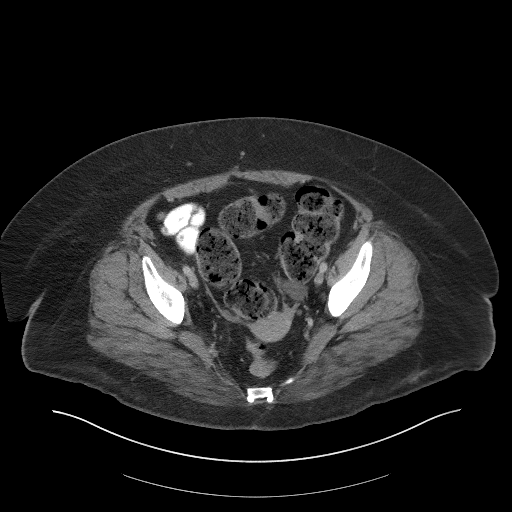
[im 45/70  bone]
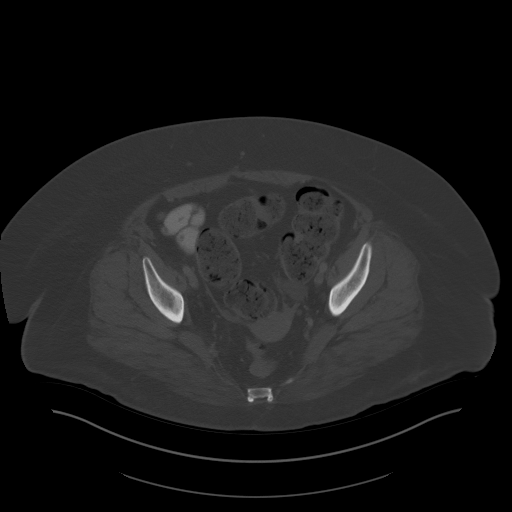
[im 49/70  soft-tissue]
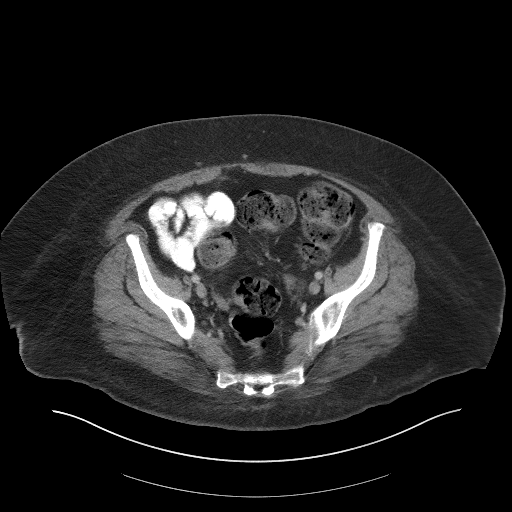
[im 56/70  soft-tissue]
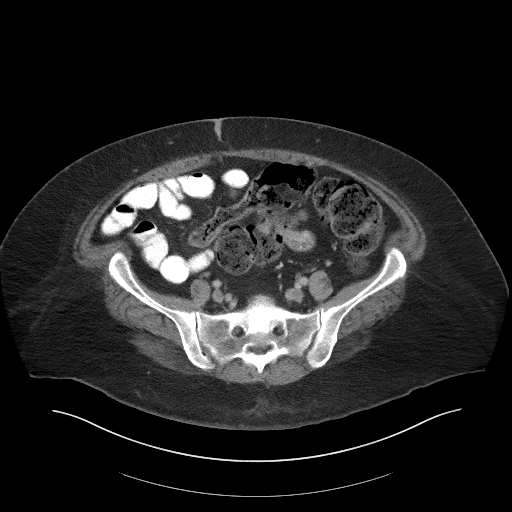
[im 61/70  soft-tissue]
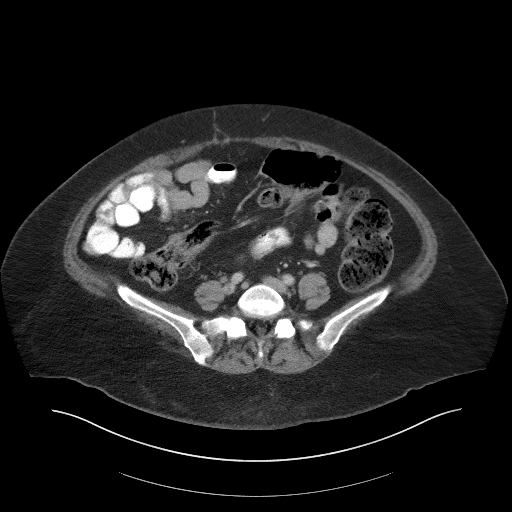
[im 65/70  soft-tissue]
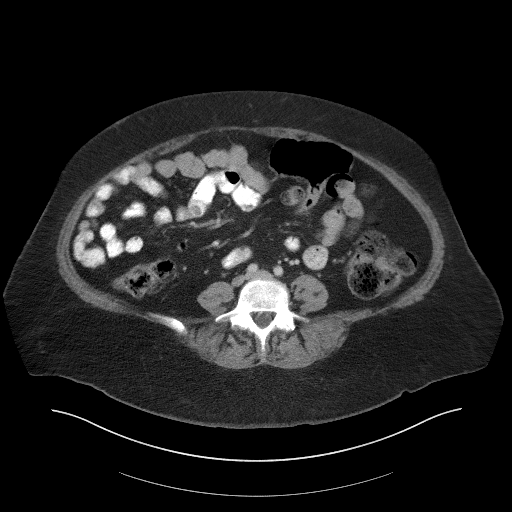

[Series 4: coronal st · coronal · 0.72mm/px · 3 of 112 slices shown]
[im 38/112  soft-tissue]
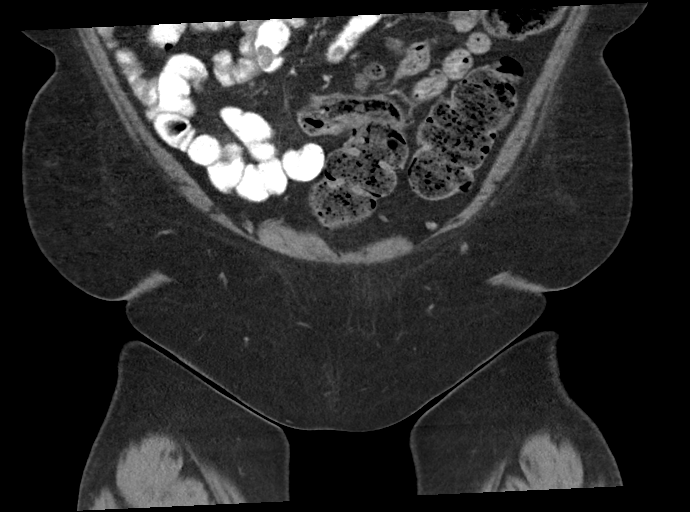
[im 50/112  soft-tissue]
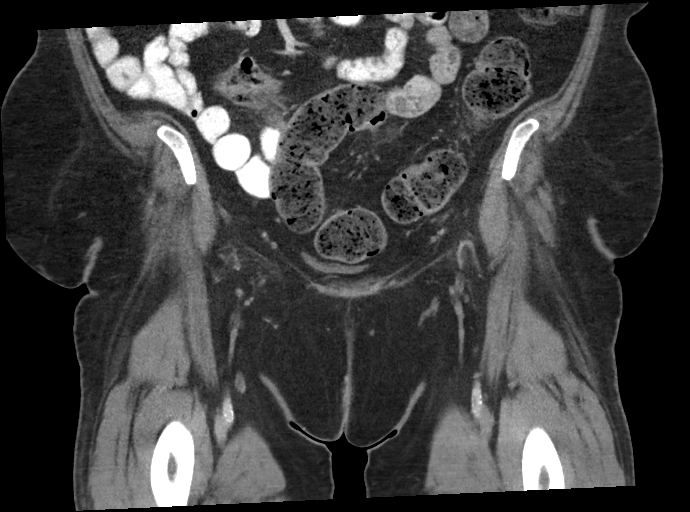
[im 62/112  soft-tissue]
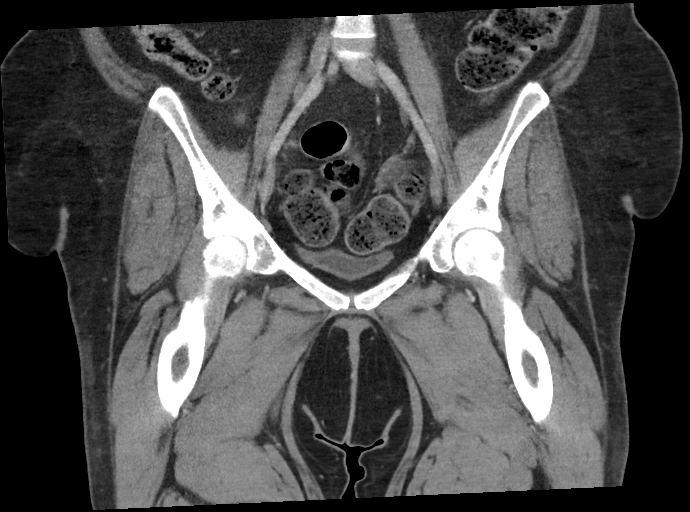

[16 of 46 positions shown; findings below may reference images not displayed]

FINDINGS: Urinary Tract:  No abnormality visualized.

Bowel:  Unremarkable visualized pelvic bowel loops.

Vascular/Lymphatic: No pathologically enlarged lymph nodes. No
significant vascular abnormality seen.

Reproductive:  No mass or other significant abnormality

Other: Mild inflammatory changes are seen involving the subcutaneous
tissues of the right buttocks, but no abscess or fluid collection is
noted.

Musculoskeletal: No suspicious bone lesions identified.
IMPRESSION: Mild inflammatory changes are seen involving subcutaneous tissues of
right buttocks consistent with cellulitis, but no underlying abscess
or fluid collection is noted. No other abnormality seen in the
pelvis.

## 2021-05-02 ENCOUNTER — Other Ambulatory Visit: Payer: Self-pay | Admitting: Internal Medicine

## 2021-05-16 ENCOUNTER — Other Ambulatory Visit: Payer: Self-pay | Admitting: Internal Medicine

## 2021-05-23 ENCOUNTER — Encounter: Payer: Self-pay | Admitting: Oncology

## 2021-05-30 ENCOUNTER — Ambulatory Visit: Payer: BC Managed Care – PPO | Admitting: Internal Medicine

## 2021-05-30 DIAGNOSIS — Z0289 Encounter for other administrative examinations: Secondary | ICD-10-CM

## 2021-06-20 ENCOUNTER — Telehealth: Payer: Self-pay | Admitting: Internal Medicine

## 2021-06-20 NOTE — Telephone Encounter (Signed)
Transfer to Access Nurse    PT called to advise that she is having a burning sensation whenever she urinates and is wanting to be seen. Nothing is available for today and got her transfer. PT believes she may have a UTI.

## 2021-06-21 NOTE — Telephone Encounter (Signed)
Called and spoke with Maria Bentley. Maria Bentley states that she was advised to go to an urgent care and was seen. She is currently on an antibiotic for her symptoms.

## 2021-06-23 ENCOUNTER — Telehealth: Payer: Self-pay | Admitting: Gastroenterology

## 2021-06-23 NOTE — Telephone Encounter (Signed)
Patient wants a medication refill on Creon until her scheduled appt. On 08-30-21. Clinical staff will follow up with patient.

## 2021-06-24 MED ORDER — PANCRELIPASE (LIP-PROT-AMYL) 36000-114000 UNITS PO CPEP: ORAL_CAPSULE | ORAL | 0 refills | Status: DC

## 2021-06-27 NOTE — Telephone Encounter (Signed)
Rx sent through e-scribe  

## 2021-06-28 ENCOUNTER — Other Ambulatory Visit: Payer: Self-pay | Admitting: Internal Medicine

## 2021-07-06 ENCOUNTER — Encounter: Payer: Self-pay | Admitting: Oncology

## 2021-07-07 ENCOUNTER — Other Ambulatory Visit: Payer: Self-pay

## 2021-07-07 ENCOUNTER — Encounter: Payer: Self-pay | Admitting: Internal Medicine

## 2021-07-07 ENCOUNTER — Ambulatory Visit: Payer: BC Managed Care – PPO | Admitting: Internal Medicine

## 2021-07-07 VITALS — BP 100/64 | HR 76 | Temp 96.5°F | Ht 64.0 in | Wt 223.6 lb

## 2021-07-07 DIAGNOSIS — E559 Vitamin D deficiency, unspecified: Secondary | ICD-10-CM

## 2021-07-07 DIAGNOSIS — R41840 Attention and concentration deficit: Secondary | ICD-10-CM

## 2021-07-07 DIAGNOSIS — E1122 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 disease: Secondary | ICD-10-CM

## 2021-07-07 DIAGNOSIS — E538 Deficiency of other specified B group vitamins: Secondary | ICD-10-CM

## 2021-07-07 DIAGNOSIS — E7800 Pure hypercholesterolemia, unspecified: Secondary | ICD-10-CM

## 2021-07-07 DIAGNOSIS — E11649 Type 2 diabetes mellitus with hypoglycemia without coma: Secondary | ICD-10-CM | POA: Diagnosis not present

## 2021-07-07 DIAGNOSIS — U099 Post covid-19 condition, unspecified: Secondary | ICD-10-CM

## 2021-07-07 DIAGNOSIS — N182 Chronic kidney disease, stage 2 (mild): Secondary | ICD-10-CM

## 2021-07-07 DIAGNOSIS — N1831 Chronic kidney disease, stage 3a: Secondary | ICD-10-CM

## 2021-07-07 LAB — COMPREHENSIVE METABOLIC PANEL
ALT: 33 U/L (ref 0–35)
AST: 20 U/L (ref 0–37)
Albumin: 3.6 g/dL (ref 3.5–5.2)
Alkaline Phosphatase: 123 U/L — ABNORMAL HIGH (ref 39–117)
BUN: 27 mg/dL — ABNORMAL HIGH (ref 6–23)
CO2: 23 mEq/L (ref 19–32)
Calcium: 9.3 mg/dL (ref 8.4–10.5)
Chloride: 104 mEq/L (ref 96–112)
Creatinine, Ser: 1.21 mg/dL — ABNORMAL HIGH (ref 0.40–1.20)
GFR: 52.09 mL/min — ABNORMAL LOW (ref >60.00)
Glucose, Bld: 146 mg/dL — ABNORMAL HIGH (ref 70–99)
Potassium: 4.4 mEq/L (ref 3.5–5.1)
Sodium: 139 mEq/L (ref 135–145)
Total Bilirubin: 0.3 mg/dL (ref 0.2–1.2)
Total Protein: 6.5 g/dL (ref 6.0–8.3)

## 2021-07-07 LAB — POCT GLYCOSYLATED HEMOGLOBIN (HGB A1C): Hemoglobin A1C: 9.1 % — AB (ref 4.0–5.6)

## 2021-07-07 LAB — LIPID PANEL
Cholesterol: 209 mg/dL — ABNORMAL HIGH (ref 0–200)
HDL: 48.4 mg/dL (ref >39.00)
LDL Cholesterol: 137 mg/dL — ABNORMAL HIGH (ref 0–99)
NonHDL: 160.57
Total CHOL/HDL Ratio: 4
Triglycerides: 117 mg/dL (ref 0.0–149.0)
VLDL: 23.4 mg/dL (ref 0.0–40.0)

## 2021-07-07 LAB — VITAMIN B12: Vitamin B-12: 200 pg/mL — ABNORMAL LOW (ref 211–911)

## 2021-07-07 LAB — VITAMIN D 25 HYDROXY (VIT D DEFICIENCY, FRACTURES): VITD: 24.45 ng/mL — ABNORMAL LOW (ref 30.00–100.00)

## 2021-07-07 MED ORDER — CYCLOBENZAPRINE HCL 10 MG PO TABS: 5.0000 mg | ORAL_TABLET | Freq: Three times a day (TID) | ORAL | 12 refills | Status: DC | PRN

## 2021-07-07 MED ORDER — METOPROLOL TARTRATE 25 MG PO TABS: 12.5000 mg | ORAL_TABLET | Freq: Two times a day (BID) | ORAL | 3 refills | Status: DC

## 2021-07-07 MED ORDER — HORIZANT 600 MG PO TBCR: 600.0000 mg | EXTENDED_RELEASE_TABLET | Freq: Two times a day (BID) | ORAL | 5 refills | Status: DC

## 2021-07-07 NOTE — Patient Instructions (Addendum)
If pre meal sugar is 250 or higher.   Take 10 units for no starch meal.  Add 2 units for every starch    If pre meal is 250 or higher and no  starch,  just do 10 units.  8  units if not eating    If pre meal sugar is from  200 to 250,   6 units for no starch.  8 units for 1 starch add 2 for every starch   If pre meal is 150 to 200,  2 units  for no starch  add 2 per starch   Expect a call from catie  et al  Reduce metoprolol dose to 12.5 mg twice daily    If c peptide is low,  endocrinology consult will be made

## 2021-07-07 NOTE — Progress Notes (Signed)
Subjective:  Patient ID: Maria Bentley, female    DOB: 12-06-1971  Age: 50 y.o. MRN: [id number]  CC: The primary encounter diagnosis was Uncontrolled type 2 diabetes mellitus with hypoglycemia without coma ([place]). Diagnoses of Vitamin D deficiency, B12 deficiency, COVID-19 long hauler manifesting chronic concentration deficit, Pure hypercholesterolemia, Morbid obesity ([place]), Stage 3a chronic kidney disease ([place]), and Type 2 diabetes mellitus with stage 2 chronic kidney disease, without long-term current use of insulin ([place]) were also pertinent to this visit.  HPItiveCynthia Rufty Rood presents for  FOLLOW UP ON TYPE 2 DIABETES, UNCONTROLLED   This visit occurred during the SARS-CoV-2 public health emergency.  Safety protocols were in place, including screening questions prior to the visit, additional usage of staff PPE, and extensive cleaning of exam room while observing appropriate contact time as indicated for disinfecting solutions.   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  Labile readings   low 300's as a high.    Has had a few in the 50's and 60's  and one 40 .  Often occurring Has been eating out a lot due to kitchen remodelling.  Ordering salads, .   chicken and fish . Weight has fluctuated.  Regimen:  cks premeal BS and whenever she feels funny ,  5-6 times daily .  Insulin regimen:  "guesstimate"  base rate of 10 adjusted up to 20 units  pre meal .  Fastings have been elevated , ofter she  has withheld her evening mealtime insuliBasaglar 40 units daily since then but has reduced to 30 due to recurrent lows.  Hs a CBG monitor   Wants referraSTILL suffering from long COVID fatigue and loss of executive function .  Retired march 25l to [employer] covid   She has had recurrent episodes of orthostasis .  Currently taking metoprolol 25 mg bid and losartan 25 mg  daily Orthostatic : reducing metoprolol to 12.5 mg bid,  continue losartan 25 mg daily   She has been diagnosed with Pancreatic insufficiency:  per tahiliani,  taking creon.    Outpatient Medications Prior to Visit  Medication Sig Dispense Refill   acetaminophen (TYLENOL) 500 MG tablet Take 2 tablets (1,000 mg total) by mouth every 8 (eight) hours. 90 tablet 2   albuterol (VENTOLIN HFA) 108 (90 Base) MCG/ACT inhaler Inhale 2 puffs into the lungs every 6 (six) hours as needed for wheezing or shortness of breath. 18 g 0   amitriptyline (ELAVIL) 25 MG tablet TAKE 1 TABLET BY MOUTH EVERYDAY AT BEDTIME 90 tablet 1   aspirin-acetaminophen-caffeine (EXCEDRIN MIGRAINE) 250-250-65 MG tablet Take 2 tablets by mouth every 6 (six) hours as needed for headache.     atorvastatin (LIPITOR) 20 MG tablet TAKE 1 TABLET BY MOUTH EVERY DAY 30 tablet 0   buPROPion (WELLBUTRIN XL) 150 MG 24 hr tablet Take 450 mg by mouth daily.   3   CLINPRO 5000 1.1 % PSTE Place 1 application onto teeth at bedtime.      Continuous Blood Gluc Receiver (FREESTYLE LIBRE 14 DAY READER) DEVI 1 applicator by Does not apply route 4 (four) times daily. 1 Device 1   Continuous Blood Gluc Sensor (FREESTYLE LIBRE 14 DAY SENSOR) MISC USE TO CHECK BLOOD SUGAR FOUR TIMES A DAY AS NEEDED 2 each 11   diphenhydrAMINE (BENADRYL) 25 MG tablet Take  25 mg by mouth every 6 (six) hours as needed for allergies.     EPINEPHrine 0.3 mg/0.3 mL IJ SOAJ injection Inject 0.3 mg into the muscle as needed for anaphylaxis.      escitalopram (LEXAPRO) 20 MG tablet Take 20 mg by mouth daily.      fluticasone (FLONASE) 50 MCG/ACT nasal spray SPRAY 2 SPRAYS INTO EACH NOSTRIL EVERY DAY 48 mL 1   Insulin Glargine (BASAGLAR KWIKPEN) 100 UNIT/ML INJECT 33 UNITS INTO THE SKIN AT BEDTIME. 15 mL 1   Insulin Pen Needle (BD PEN NEEDLE NANO U/F) 32G X 4 MM MISC Please specify directions, refills and quantity 100 each 11   lamoTRIgine (LAMICTAL) 150 MG tablet Take 150 mg by  mouth daily.     levothyroxine (SYNTHROID) 50 MCG tablet TAKE 1 TABLET BY MOUTH EVERY DAY 90 tablet 3   lipase/protease/amylase (CREON) 36000 UNITS CPEP capsule Take 2 capsules (72,000 Units total) by mouth 3 (three) times daily before meals AND 1 capsule (36,000 Units total) with snacks 270 capsule 0   LORazepam (ATIVAN) 0.5 MG tablet Take 0.5-1 mg by mouth daily as needed for anxiety.      losartan (COZAAR) 25 MG tablet Take 1 tablet by mouth daily.     meclizine (ANTIVERT) 25 MG tablet Take 1 tablet (25 mg total) by mouth 3 (three) times daily as needed for dizziness. 90 tablet 6   Melatonin 10 MG TABS Take 10 mg by mouth at bedtime as needed (sleep).      NOVOLOG FLEXPEN 100 UNIT/ML FlexPen INJECT 5-20 UNITS INTO THE SKIN 3 (THREE) TIMES DAILY WITH MEALS. INJECT 15 UNITS IN THE MORNING, 20 UNITS AT LUNCH AND 5-10 UNITS WITH DINNER 45 mL 0   NURTEC 75 MG TBDP Take 1 tablet by mouth daily.     nystatin (MYCOSTATIN/NYSTOP) powder APPLY 1 APPLICATION TO AFFECTED AREA TWICE A DAY 60 g 0   OnabotulinumtoxinA (BOTOX IJ) Inject as directed every 3 (three) months. For migraine     ondansetron (ZOFRAN ODT) 4 MG disintegrating tablet Take 1 tablet (4 mg total) by mouth every 8 (eight) hours as needed for nausea or vomiting. 20 tablet 0   oxyCODONE (ROXICODONE) 5 MG immediate release tablet Take 1 tablet (5 mg total) by mouth every 8 (eight) hours as needed. 20 tablet 0   Rimegepant Sulfate (NURTEC PO) Take by mouth as needed.     cyclobenzaprine (FLEXERIL) 10 MG tablet Take 5-10 mg by mouth 3 (three) times daily as needed for muscle spasms.   12   metoprolol tartrate (LOPRESSOR) 25 MG tablet TAKE 1 TABLET BY MOUTH TWICE A DAY 180 tablet 3   cyanocobalamin (,VITAMIN B-12,) 1000 MCG/ML injection INJECT 1 ML (1,000 MCG TOTAL) INTO THE MUSCLE ONCE A WEEK. FOR 4 WEEKS, THEN MONTHLY THEREAFTER (Patient not taking: Reported on 07/07/2021) 3 mL 1   diphenoxylate-atropine (LOMOTIL) 2.5-0.025 MG tablet Take 1 tablet  by mouth 4 (four) times daily as needed for diarrhea or loose stools. (Patient not taking: Reported on 07/07/2021) 30 tablet 1   furosemide (LASIX) 20 MG tablet TAKE 1 TABLET (20 MG TOTAL) BY MOUTH DAILY. AS NEEDED FOR FLUID RETENTION (Patient not taking: Reported on 07/07/2021) 90 tablet 1   loperamide (IMODIUM) 2 MG capsule Take 2 capsules (4 mg total) by mouth as needed for diarrhea or loose stools. (Patient not taking: Reported on 07/07/2021) 30 capsule 0   neomycin-polymyxin b-dexamethasone (MAXITROL) 3.5-10000-0.1 SUSP Place 1 drop into both eyes 2 (two)  times daily. (Patient not taking: Reported on 07/07/2021)     SYRINGE-NEEDLE, DISP, 3 ML (BD INTEGRA SYRINGE) 25G X 1" 3 ML MISC FOR USE WITH B12 INJECTIONS WEEKLY/MONTHLY (Patient not taking: Reported on 07/07/2021) 25 each 0   azelastine (ASTELIN) 0.1 % nasal spray Place 2 sprays into both nostrils daily as needed for rhinitis or allergies.  (Patient not taking: Reported on 07/07/2021)     blood glucose meter kit and supplies KIT Dispense based on patient and insurance preference. Use up to four times daily as directed. (FOR ICD-E11.21) (Patient not taking: Reported on 07/07/2021) 1 each 0   HORIZANT 600 MG TBCR Take 600 mg by mouth 2 (two) times daily.  (Patient not taking: Reported on 07/07/2021)     Lancet Device MISC Test blood sugar two times daily (Patient not taking: Reported on 07/07/2021) 100 each 6   polyethylene glycol-electrolytes (NULYTELY) 420 g solution Prepare according to package instructions. Starting at 5:00 PM: Drink one 8 oz glass of mixture every 15 minutes until you finish half of the jug. Five hours prior to procedure, drink 8 oz glass of mixture every 15 minutes until it is all gone. Make sure you do not drink anything 4 hours prior to your procedure. (Patient not taking: No sig reported) 4000 mL 0   promethazine (PHENERGAN) 25 MG tablet Take 25 mg by mouth every 6 (six) hours as needed for nausea or vomiting.  (Patient not taking:  Reported on 07/07/2021)     Vitamin D, Ergocalciferol, (DRISDOL) 1.25 MG (50000 UNIT) CAPS capsule TAKE 1 CAPSULE BY MOUTH ONE TIME PER WEEK (Patient not taking: Reported on 07/07/2021) 12 capsule 1   No facility-administered medications prior to visit.    Review of Systems;  Patient denies headache, fevers, malaise, unintentional weight loss, skin rash, eye pain, sinus congestion and sinus pain, sore throat, dysphagia,  hemoptysis , cough, dyspnea, wheezing, chest pain, palpitations, orthopnea, edema, abdominal pain, nausea, melena, diarrhea, constipation, flank pain, dysuria, hematuria, urinary  Frequency, nocturia, numbness, tingling, seizures,  Focal weakness, Loss of consciousness,  Tremor, insomnia, depression, anxiety, and suicidal ideation.      Objective:  BP 100/64 (BP Location: Right Arm, Patient Position: Sitting, Cuff Size: Normal)   Pulse 76   Temp (!) 96.5 F (35.8 C) (Temporal)   Ht _0  (1.626 m)   Wt 223 lb 9.6 oz (101.4 kg)   LMP 05/09/2016 (LMP Unknown) Comment: D/C  SpO2 96%   BMI 38.38 kg/m   BP Readings from Last 3 Encounters:  07/07/21 100/64  11/27/20 (!) 141/88  11/26/20 99/69    Wt Readings from Last 3 Encounters:  07/07/21 223 lb 9.6 oz (101.4 kg)  11/26/20 243 lb (110.2 kg)  11/26/20 242 lb 8.1 oz (110 kg)    General appearance: alert, cooperative and appears stated age Ears: normal TM's and external ear canals both ears Throat: lips, mucosa, and tongue normal; teeth and gums normal Neck: no adenopathy, no carotid bruit, supple, symmetrical, trachea midline and thyroid not enlarged, symmetric, no tenderness/mass/nodules Back: symmetric, no curvature. ROM normal. No CVA tenderness. Lungs: clear to auscultation bilaterally Heart: regular rate and rhythm, S1, S2 normal, no murmur, click, rub or gallop Abdomen: soft, non-tender; bowel sounds normal; no masses,  no organomegaly Pulses: 2+ and symmetric Skin: Skin color, texture, turgor normal. No  rashes or lesions Lymph nodes: Cervical, supraclavicular, and axillary nodes normal.  Lab Results  Component Value Date   HGBA1C 9.1 (A) 07/07/2021  HGBA1C 11.0 (H) 01/27/2021   HGBA1C 8.9 (H) 09/15/2020    Lab Results  Component Value Date   CREATININE 1.21 (H) 07/07/2021   CREATININE 1.15 01/27/2021   CREATININE 1.16 09/15/2020    Lab Results  Component Value Date   WBC 10.6 (H) 07/22/2020   HGB 11.4 (L) 07/22/2020   HCT 38.3 07/22/2020   PLT 404 (H) 07/22/2020   GLUCOSE 146 (H) 07/07/2021   CHOL 209 (H) 07/07/2021   TRIG 117.0 07/07/2021   HDL 48.40 07/07/2021   LDLDIRECT 92.0 02/21/2017   LDLCALC 137 (H) 07/07/2021   ALT 33 07/07/2021   AST 20 07/07/2021   NA 139 07/07/2021   K 4.4 07/07/2021   CL 104 07/07/2021   CREATININE 1.21 (H) 07/07/2021   BUN 27 (H) 07/07/2021   CO2 23 07/07/2021   TSH 1.63 09/15/2020   INR 0.9 10/25/2019   HGBA1C 9.1 (A) 07/07/2021   MICROALBUR 1.8 09/19/2019    Assessment & Plan:   Problem List Items Addressed This Visit       Unprioritized   Hyperlipidemia    LDL has risen ,  Complained with atorvastatin likely the issue.        Relevant Medications   metoprolol tartrate (LOPRESSOR) 25 MG tablet   Morbid obesity ([place])    Complicated by type 2 diabetes , sleep apnea and now long haul COVID        Diabetes mellitus type 2, uncontrolled ([place]) - Primary    Advised to increase tresiba to 35 units,  Sliding scale rewritten       Relevant Orders   POCT HgB A1C (Completed)   AMB Referral to [facility] Coordin[facility]   C-peptide (Completed)   Comprehensive metabolic panel (Completed)   Lipid panel (Completed)   Vitamin D deficiency   Relevant Orders   VITAMIN D 25 Hydroxy (Vit-D Deficiency, Fractures) (Completed)   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 disease ([place])     Given her pancreatic insufficiency ,  C peptide ordered , which was normal         Stage 3a chronic kidney disease ([place])     Secondary to uncontrolled diabetes, hypertension.  Cr is stable  Lab Results  Component Value Date   CREATININE 1.21 (H) 07/07/2021         COVID-19 long hauler manifesting chronic concentration deficit    She has had to retire from work due to concentration deficits and chronic fatigue . Referral to [facility]       Relevant Orders   Ambulatory referral to Physical Therapy   Other Visit Diagnoses     B12 deficiency       Relevant Orders   Vitamin B12 (Completed)       I have discontinued Aeryn R. Burbridge "Cindy"'s Lancet Device, blood glucose meter kit and supplies, azelastine, Vitamin D (Ergocalciferol), promethazine, and polyethylene glycol-electrolytes. I have also changed her Horizant, cyclobenzaprine, and metoprolol tartrate. Additionally, I am having her maintain her OnabotulinumtoxinA (BOTOX IJ), LORazepam, meclizine, EPINEPHrine, escitalopram, FreeStyle Libre 14 Day Reader, buPROPion, SYRINGE-NEEDLE (DISP) 3 ML, Insulin Pen Needle, lamoTRIgine, albuterol, aspirin-acetaminophen-caffeine, loperamide, Clinpro 5000, diphenhydrAMINE, Melatonin, Rimegepant Sulfate (NURTEC PO), ondansetron, acetaminophen, diphenoxylate-atropine, FreeStyle Libre 14 Day Sensor, losartan, Nurtec, oxyCODONE, neomycin-polymyxin b-dexamethasone, nystatin, levothyroxine, furosemide, amitriptyline, fluticasone, Basaglar KwikPen, cyanocobalamin, lipase/protease/amylase, NovoLOG FlexPen, and atorvastatin.  Meds ordered this encounter  Medications   HORIZANT 600 MG TBCR    Sig: Take 1 tablet (600 mg total)  by mouth 2 (two) times daily.    Dispense:  60 tablet    Refill:  5   cyclobenzaprine (FLEXERIL) 10 MG tablet    Sig: Take 0.5-1 tablets (5-10 mg total) by mouth 3 (three) times daily as needed for muscle spasms.    Dispense:  30 tablet    Refill:  12   metoprolol tartrate (LOPRESSOR) 25 MG tablet    Sig: Take 0.5 tablets (12.5 mg total) by mouth 2 (two) times daily.     Dispense:  90 tablet    Refill:  3    Medications Discontinued During This Encounter  Medication Reason   azelastine (ASTELIN) 0.1 % nasal spray    blood glucose meter kit and supplies KIT    Lancet Device MISC    polyethylene glycol-electrolytes (NULYTELY) 420 g solution    promethazine (PHENERGAN) 25 MG tablet    Vitamin D, Ergocalciferol, (DRISDOL) 1.25 MG (50000 UNIT) CAPS capsule Completed Course   HORIZANT 600 MG TBCR Reorder   cyclobenzaprine (FLEXERIL) 10 MG tablet Reorder   metoprolol tartrate (LOPRESSOR) 25 MG tablet     Follow-up: Return in about 3 months (around 10/07/2021) for follow up diabetes.   [name], MD

## 2021-07-08 LAB — C-PEPTIDE: C-Peptide: 1.31 ng/mL (ref 0.80–3.85)

## 2021-07-09 DIAGNOSIS — R41840 Attention and concentration deficit: Secondary | ICD-10-CM | POA: Insufficient documentation

## 2021-07-09 DIAGNOSIS — U099 Post covid-19 condition, unspecified: Secondary | ICD-10-CM | POA: Insufficient documentation

## 2021-07-09 NOTE — Assessment & Plan Note (Signed)
Advised to increase tresiba to 35 units,  Sliding scale rewritten

## 2021-07-09 NOTE — Assessment & Plan Note (Signed)
LDL has risen ,  Complained with atorvastatin likely the issue.

## 2021-07-09 NOTE — Assessment & Plan Note (Addendum)
Given her pancreatic insufficiency ,  C peptide ordered , which was normal  .  Continue Tresiba at 35 units, metformin  Mealtime insulin regimen adjusted.  Cr is stable.

## 2021-07-09 NOTE — Assessment & Plan Note (Signed)
Secondary to uncontrolled diabetes, hypertension.  Cr is stable  Lab Results  Component Value Date   CREATININE 1.21 (H) 07/07/2021

## 2021-07-09 NOTE — Assessment & Plan Note (Signed)
She has had to retire from work due to concentration deficits and chronic fatigue . Referral to [facility]

## 2021-07-09 NOTE — Assessment & Plan Note (Signed)
Complicated by type 2 diabetes , sleep apnea and now long haul COVID

## 2021-07-11 ENCOUNTER — Telehealth: Payer: Self-pay

## 2021-07-11 NOTE — Chronic Care Management (AMB) (Signed)
  Care Management   Note  07/11/2021 Name: Maria Bentley MRN: [id number] DOB: 14-Nov-1971  Maria Bentley is a 50 y.o. year old female who is a primary care patient of [name], Aris Everts, MD. I reached out to Maria Bentley by phone today in response to a referral sent by [name]'s health plan.    [name] was given information about care management services today including:  Care management services include personalized support from designated clinical staff supervised by her physician, including individualized plan of care and coordination with other care providers 24/7 contact phone numbers for assistance for urgent and routine care needs. The patient may stop care management services at any time by phone call to the office staff.  Patient agreed to services and verbal consent obtained.   Follow up plan: Telephone appointment with care management team member scheduled for:07/20/2021  Noreene Larsson, [address], [place], [place] [address] Direct Dial: [phone] [email or web address]

## 2021-07-12 ENCOUNTER — Ambulatory Visit: Payer: BC Managed Care – PPO

## 2021-07-12 ENCOUNTER — Other Ambulatory Visit: Payer: Self-pay

## 2021-07-12 ENCOUNTER — Ambulatory Visit
Admission: RE | Admit: 2021-07-12 | Discharge: 2021-07-12 | Disposition: A | Discharge status: Home / Self Care | Payer: BC Managed Care – PPO | Source: Ambulatory Visit | Attending: Physician Assistant | Admitting: Physician Assistant

## 2021-07-12 ENCOUNTER — Telehealth: Payer: Self-pay | Admitting: Internal Medicine

## 2021-07-12 VITALS — BP 139/85 | HR 137 | Temp 99.1°F | Resp 18

## 2021-07-12 DIAGNOSIS — L03317 Cellulitis of buttock: Secondary | ICD-10-CM | POA: Diagnosis not present

## 2021-07-12 DIAGNOSIS — L0231 Cutaneous abscess of buttock: Secondary | ICD-10-CM

## 2021-07-12 MED ORDER — DOXYCYCLINE HYCLATE 100 MG PO CAPS: 100.0000 mg | ORAL_CAPSULE | Freq: Two times a day (BID) | ORAL | 0 refills | Status: DC

## 2021-07-12 NOTE — ED Notes (Addendum)
Patient has elevated heart rate. She states her metoprolol dose was changed a few days ago but she has not picked up her prescription. She reports not taken this med for the past 2 days. Provider was notified.

## 2021-07-12 NOTE — ED Provider Notes (Signed)
[facility] Maria Bentley    CSN: [id number] Arrival date & time: 07/12/21  1405      History   Chief Complaint Chief Complaint  Patient presents with   Abscess   Appointment    1400     HPI Maria Bentley is a 50 y.o. female.   50 year old female with history of DM comes in for 5 day history of abscess to the right lower buttock.  Started as a small bump, and has spread throughout the past 5 days.  Area self drained yesterday, for which improved the pain.  However, patient has felt unwell with nausea.  No known fevers.  CBGs has been elevated to 300-400s.   Past Medical History:  Diagnosis Date   Abscess of right buttock 04/30/2019   Anemia    Anxiety    COVID-19 virus infection    Depression 2010   Diabetes mellitus    DKA, type 2 ([place]) 10/13/2019   Dysrhythmia    Endometriosis    Headache, common migraine, intractable    History of kidney stones    History of ovarian cyst    left sided, caused by her endometriosis (possibly an endometrioma), in her 29s.   Hyperlipidemia    Hypertension    Kidney stones    left   Morbid obesity (HCC)    s/p gastric bypass   Personal hx of gastric bypass 2010   Rosacea    Vertigo     Patient Active Problem List   Diagnosis Date Noted   COVID-19 long hauler manifesting chronic concentration deficit 07/09/2021   Encounter for screening colonoscopy    Diarrhea 09/04/2020   Anemia in chronic kidney disease 08/25/2020   Hematuria 08/25/2020   Stage 3a chronic kidney disease ([place]) 08/25/2020   Analgesic overuse headache 08/24/2020   Atypical facial pain 08/24/2020   Bruxism (teeth grinding) 08/24/2020   Cervical syndrome 08/24/2020   Disorder of autonomic nervous system 08/24/2020   Elevated blood-pressure reading without diagnosis of hypertension 08/24/2020   Fibromyositis 08/24/2020   Temporomandibular joint disorder 08/24/2020   Hypoglycemic reaction 08/21/2020   Coronary atherosclerosis due to calcified coronary  lesion 08/21/2020   Renal mass, left 07/06/2020   Chronic right shoulder pain 02/09/2020   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 disease ([place]) 01/26/2020   Recurrent headache 01/13/2020   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 12/30/2019   Elevated alkaline phosphatase level 11/21/2019   Hospital discharge follow-up 11/04/2019   Cervical spondylosis without myelopathy 10/29/2019   Pressure injury of skin 10/16/2019   History of gastric bypass 10/13/2019   COVID-19 virus infection 10/06/2019   Preoperative evaluation of a medical condition to rule out surgical contraindications (TAR required) 09/21/2019   Morbid obesity with BMI of 40.0-44.9, adult ([place]) 06/24/2019   History of bariatric surgery 04/15/2019   Cervicocranial syndrome 11/12/2018   Depressive disorder 11/12/2018   Fibromyalgia 06/05/2018   Iron deficiency anemia 06/05/2018   Enteritis, enteropathogenic E. 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   Morbid obesity ([place]) 06/09/2012   Anxiety    Headache, common migraine,  intractable    Hyperlipidemia    Hypothyroid    OSA (obstructive sleep apnea) 02/05/2012    Past Surgical History:  Procedure Laterality Date   CARPAL TUNNEL RELEASE Right 11/26/2020   Procedure: CARPAL TUNNEL RELEASE;  Surgeon: Hessie Knows, MD;  Location: [facility] OR[facility];  Service: Orthopedics;  Laterality: Right;   COLONOSCOPY WITH PROPOFOL N/A 10/21/2020   Procedure: COLONOSCOPY WITH PROPOFOL;  Surgeon: Virgel Manifold, MD;  Location: [facility];  Service: Endoscopy;  Laterality: N/A;    DILATION AND CURETTAGE OF UTERUS  2011   gastric bypass     OPEN REDUCTION INTERNAL FIXATION (ORIF) DISTAL RADIAL FRACTURE Right 11/26/2020   Procedure: Open reduction internal fixation, right distal radius;  Surgeon: Hessie Knows, MD;  Location: [facility] OR[facility];  Service: Orthopedics;  Laterality: Right;   OVARIAN CYST REMOVAL     ROTATOR CUFF REPAIR     right   SHOULDER ARTHROSCOPY WITH SUBACROMIAL DECOMPRESSION AND OPEN ROTATOR C Right 07/19/2020   Procedure: Right shoulder arthroscopic vs mini-open rotator cuff repair vs Regeneten patch application, subacomial dcompression, and biceps tenodesis;  Surgeon: Leim Fabry, MD;  Location: [facility] ORS;  Service: Orthopedics;  Laterality: Right;    OB History     Gravida  0   Para  0   Term  0   Preterm  0   AB  0   Living  0      SAB  0   IAB  0   Ectopic  0   Multiple  0   Live Births  0            Home Medications    Prior to Admission medications   Medication Sig Start Date End Date Taking? Authorizing Provider  doxycycline (VIBRAMYCIN) 100 MG capsule Take 1 capsule (100 mg total) by mouth 2 (two) times daily. 07/12/21  Yes Gabriela Irigoyen V, PA-C  acetaminophen (TYLENOL) 500 MG tablet Take 2 tablets (1,000 mg total) by mouth every 8 (eight) hours. 07/19/20 07/19/21  Leim Fabry, MD  albuterol (VENTOLIN HFA) 108 (90 Base) MCG/ACT inhaler Inhale 2 puffs into the lungs every 6 (six) hours as needed for wheezing or shortness of breath. 10/02/19   Terald Sleeper, PA-C  amitriptyline (ELAVIL) 25 MG tablet TAKE 1 TABLET BY MOUTH EVERYDAY AT BEDTIME 02/16/21   [name], MD  aspirin-acetaminophen-caffeine (EXCEDRIN MIGRAINE) [phone] MG tablet Take 2 tablets by mouth every 6 (six) hours as needed for headache.    [provider]  atorvastatin (LIPITOR) 20 MG tablet TAKE 1 TABLET BY MOUTH EVERY DAY 06/28/21   [name], MD  buPROPion (WELLBUTRIN XL) 150 MG 24 hr tablet Take 450 mg by mouth daily.  05/12/18   [provider]  CLINPRO 5000 1.1 % PSTE Place 1 application onto teeth at bedtime.  06/15/20   [provider]  Continuous Blood Gluc Receiver (FREESTYLE LIBRE 14 DAY READER) DEVI 1 applicator by Does not apply route 4 (four) times daily. 11/24/17   [name], MD  Continuous Blood Gluc Sensor (FREESTYLE LIBRE 14 DAY SENSOR) MISC USE TO CHECK BLOOD SUGAR FOUR TIMES A DAY AS NEEDED 09/28/20   [name], MD  cyanocobalamin (,VITAMIN B-12,) 1000 MCG/ML injection INJECT 1 ML (1,000 MCG TOTAL) INTO THE MUSCLE ONCE A WEEK. FOR 4 WEEKS, THEN MONTHLY THEREAFTER Patient not taking: Reported on 07/07/2021 05/16/21   [name], MD  cyclobenzaprine (FLEXERIL) 10 MG tablet Take 0.5-1 tablets (5-10 mg total)  by mouth 3 (three) times daily as needed for muscle spasms. 07/07/21   [name], MD  diphenhydrAMINE (BENADRYL) 25 MG tablet Take 25 mg by mouth every 6 (six) hours as needed for allergies.    [provider]  diphenoxylate-atropine (LOMOTIL) 2.5-0.025 MG tablet Take 1 tablet by mouth 4 (four) times daily as needed for diarrhea or loose stools. Patient not taking: Reported on 07/07/2021 08/06/20   [name], MD  EPINEPHrine 0.3 mg/0.3 mL IJ SOAJ injection Inject 0.3 mg into the muscle as needed for anaphylaxis.  06/25/10   [provider]  escitalopram (LEXAPRO) 20 MG tablet Take 20 mg by mouth daily.  02/27/17   [provider]  fluticasone (FLONASE) 50 MCG/ACT nasal spray SPRAY 2 SPRAYS INTO EACH NOSTRIL EVERY DAY 04/21/21   [name], MD  furosemide (LASIX) 20 MG tablet TAKE 1 TABLET (20 MG TOTAL) BY MOUTH DAILY. AS NEEDED FOR FLUID RETENTION Patient not taking: Reported on 07/07/2021 02/01/21   [name], MD  HORIZANT 600 MG TBCR Take 1 tablet (600 mg total) by mouth 2 (two) times daily. 07/07/21   [name], MD  Insulin Glargine [facility] KWIKPEN) 100 UNIT/ML INJECT 33 UNITS INTO THE SKIN AT BEDTIME. 05/02/21   [name], MD   Insulin Pen Needle (BD PEN NEEDLE NANO U/F) 32G X 4 MM MISC Please specify directions, refills and quantity 08/22/18   [name], MD  lamoTRIgine (LAMICTAL) 150 MG tablet Take 150 mg by mouth daily.    [provider]  levothyroxine (SYNTHROID) 50 MCG tablet TAKE 1 TABLET BY MOUTH EVERY DAY 01/13/21   [name], MD  lipase/protease/amylase (CREON) 36000 UNITS CPEP capsule Take 2 capsules (72,000 Units total) by mouth 3 (three) times daily before meals AND 1 capsule (36,000 Units total) with snacks 06/24/21   Virgel Manifold, MD  loperamide (IMODIUM) 2 MG capsule Take 2 capsules (4 mg total) by mouth as needed for diarrhea or loose stools. Patient not taking: Reported on 07/07/2021 10/25/19   Damita Lack, MD  LORazepam (ATIVAN) 0.5 MG tablet Take 0.5-1 mg by mouth daily as needed for anxiety.     [provider]  losartan (COZAAR) 25 MG tablet Take 1 tablet by mouth daily. 09/28/20 09/28/21  [provider]  meclizine (ANTIVERT) 25 MG tablet Take 1 tablet (25 mg total) by mouth 3 (three) times daily as needed for dizziness. 06/16/15   [name], MD  Melatonin 10 MG TABS Take 10 mg by mouth at bedtime as needed (sleep).     [provider]  metoprolol tartrate (LOPRESSOR) 25 MG tablet Take 0.5 tablets (12.5 mg total) by mouth 2 (two) times daily. 07/07/21   [name], MD  neomycin-polymyxin b-dexamethasone (MAXITROL) 3.5-10000-0.1 SUSP Place 1 drop into both eyes 2 (two) times daily. Patient not taking: Reported on 07/07/2021 11/25/20   [provider]  NOVOLOG FLEXPEN 100 UNIT/ML FlexPen INJECT 5-20 UNITS INTO THE SKIN 3 (THREE) TIMES DAILY WITH MEALS. INJECT 15 UNITS IN THE MORNING, 20 UNITS AT LUNCH AND 5-10 UNITS WITH DINNER 06/28/21   [name], MD  NURTEC 75 MG TBDP Take 1 tablet by mouth daily. 10/06/20   [provider]  nystatin (MYCOSTATIN/NYSTOP) powder APPLY 1 APPLICATION TO AFFECTED AREA TWICE A DAY  12/23/20   Leone Haven, MD  OnabotulinumtoxinA (BOTOX IJ) Inject as directed every 3 (three) months. For migraine    [provider]  ondansetron (ZOFRAN ODT) 4 MG disintegrating tablet Take 1 tablet (4 mg total) by mouth every 8 (eight) hours as needed for nausea or vomiting. 07/19/20   Leim Fabry, MD  oxyCODONE (ROXICODONE) 5 MG immediate release tablet Take 1 tablet (5 mg total) by mouth every 8 (eight) hours as needed. 11/26/20 11/26/21  Hessie Knows, MD  Rimegepant Sulfate (NURTEC PO) Take by mouth as needed.    [provider]  SYRINGE-NEEDLE, DISP, 3 ML (BD INTEGRA SYRINGE) 25G X 1" 3 ML MISC FOR USE WITH B12 INJECTIONS WEEKLY/MONTHLY Patient not taking: Reported on 07/07/2021 08/01/18   [name], MD    Family History Family History  Problem Relation Age of Onset   Diabetes Mother    Hypertension Mother    Hypertension Father    Diabetes Father    Depression Other        strong hx of   Breast cancer Paternal Grandmother 26   Breast cancer Maternal Grandmother    Cancer Neg Hx     Social History Social History   Tobacco Use   Smoking status: Former    Packs/day: 1.00    Years: 4.00    Pack years: 4.00    Types: Cigarettes    Quit date: 02/05/1996    Years since quitting: 25.4   Smokeless tobacco: Never  Vaping Use   Vaping Use: Never used  Substance Use Topics   Alcohol use: No   Drug use: No     Allergies   Amoxicillin, Avocado, Clarithromycin, Clarithromycin, Honey, Other, Sulfa antibiotics, Sulfa drugs cross reactors, Watermelon concentrate, Insulin detemir, Tape, and Victoza [liraglutide]   Review of Systems Review of Systems  Reason unable to perform ROS: See HPI as above.    Physical Exam Triage Vital Signs ED Triage Vitals  Enc Vitals Group     BP 07/12/21 1433 139/85     Pulse Rate 07/12/21 1433 (!) 137     Resp 07/12/21 1433 18     Temp 07/12/21 1433 99.1 F (37.3 C)     Temp Source 07/12/21 1433 Oral     SpO2  07/12/21 1433 96 %     Weight --      Height --      Head Circumference --      Peak Flow --      Pain Score 07/12/21 1434 4     Pain Loc --      Pain Edu? --      Excl. in [place]? --    No data found.  Updated Vital Signs BP 139/85 (BP Location: Right Arm)   Pulse (!) 137   Temp 99.1 F (37.3 C) (Oral)   Resp 18   LMP 05/09/2016 (LMP Unknown) Comment: D/C  SpO2 96%    Physical Exam Constitutional:      General: She is not in acute distress.    Appearance: Normal appearance. She is well-developed. She is not toxic-appearing or diaphoretic.  HENT:     Head: Normocephalic and atraumatic.  Eyes:     Conjunctiva/sclera: Conjunctivae normal.     Pupils: Pupils are equal, round, and reactive to light.  Pulmonary:     Effort: Pulmonary effort is normal. No respiratory distress.  Genitourinary:    Comments: Self draining abscess to the right buttock, with opening approx 0.5cm in size. No purulent drainage noted at this time. Surrounding cellulitis, with erythema approx 10cm surrounding. Does not extend to rectum Musculoskeletal:  Cervical back: Normal range of motion and neck supple.  Skin:    General: Skin is warm and dry.  Neurological:     Mental Status: She is alert and oriented to person, place, and time.     UC Treatments / Results  Labs (all labs ordered are listed, but only abnormal results are displayed) Labs Reviewed - No data to display  EKG   Radiology No results found.  Procedures Procedures (including critical care time)  Medications Ordered in UC Medications - No data to display  Initial Impression / Assessment and Plan / UC Course  I have reviewed the triage vital signs and the nursing notes.  Pertinent labs & imaging results that were available during my care of the patient were reviewed by me and considered in my medical decision making (see chart for details).    Abscess has self drained sufficiently.  Given fast onset of symptoms, would  like to cover for MRSA, start doxycycline as directed.  Wound care instructions given.  Return precautions given.  Patient expresses understanding and agrees plan.  Final Clinical Impressions(s) / UC Diagnoses   Final diagnoses:  Cellulitis and abscess of buttock   ED Prescriptions     Medication Sig Dispense Auth. Provider   doxycycline (VIBRAMYCIN) 100 MG capsule Take 1 capsule (100 mg total) by mouth 2 (two) times daily. 20 capsule Ok Edwards, PA-C      PDMP not reviewed this encounter.   Ok Edwards, PA-C 07/12/21 1521

## 2021-07-12 NOTE — Telephone Encounter (Signed)
Patient informed, Due to the high volume of calls and your symptoms we have to forward your call to our Triage Nurse to expedient your call. Please hold for the transfer.  Patient transferred to [facility]. Due to having an abscess on her bottom that has been draining.No appointments available in office or virtual.

## 2021-07-12 NOTE — Telephone Encounter (Signed)
Patient was instructed by Access nurse to go be evaluated at [facility].

## 2021-07-12 NOTE — Discharge Instructions (Addendum)
Abscess has self drained well Start doxycycline as directed Sitz bath/warm compress until symptoms resolve  If symptoms not improving in 3-4 days, follow up here or with PCP for further evaluation. If having fever, lightheadedness, weakness, blood sugar higher than what it is now, go to the emergency department for further evaluation

## 2021-07-12 NOTE — ED Triage Notes (Signed)
Patient presents to Urgent Care with complaints of abscess located on her right lower buttock that she noted Friday. She states since it has increased in size and has developed drainage.   Denies fever.

## 2021-07-13 ENCOUNTER — Other Ambulatory Visit: Payer: Self-pay | Admitting: Internal Medicine

## 2021-07-13 MED ORDER — CYANOCOBALAMIN 1000 MCG/ML IJ SOLN: 1000.0000 ug | INTRAMUSCULAR | 1 refills | Status: DC

## 2021-07-20 ENCOUNTER — Telehealth: Payer: BC Managed Care – PPO

## 2021-07-26 ENCOUNTER — Other Ambulatory Visit: Payer: Self-pay | Admitting: Gastroenterology

## 2021-08-02 ENCOUNTER — Telehealth: Payer: BC Managed Care – PPO

## 2021-08-04 ENCOUNTER — Telehealth: Payer: Self-pay

## 2021-08-04 ENCOUNTER — Other Ambulatory Visit: Payer: Self-pay | Admitting: Internal Medicine

## 2021-08-04 DIAGNOSIS — Z1231 Encounter for screening mammogram for malignant neoplasm of breast: Secondary | ICD-10-CM

## 2021-08-04 DIAGNOSIS — E1122 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 disease: Secondary | ICD-10-CM

## 2021-08-04 DIAGNOSIS — Z794 Long term (current) use of insulin: Secondary | ICD-10-CM

## 2021-08-04 NOTE — Chronic Care Management (AMB) (Signed)
  Care Management   Note  08/04/2021 Name: Maria Bentley MRN: [id number] DOB: [birth year]  Maria Bentley is a 49 y.o. year old female who is a primary care patient of [name], Aris Everts, MD and is actively engaged with the care management team. I reached out to Dorinda Hill by phone today to assist with re-scheduling an initial visit with the Pharmacist  Follow up plan: Unsuccessful telephone outreach attempt made. A HIPAA compliant phone message was left for the patient providing contact information and requesting a return call.  The care management team will reach out to the patient again over the next 3 days.  If patient returns call to provider office, please advise to call [facility]  at [place], [place], [place], [place] [address] Direct Dial: [phone] [name]

## 2021-08-06 ENCOUNTER — Other Ambulatory Visit: Payer: Self-pay | Admitting: Internal Medicine

## 2021-08-08 ENCOUNTER — Other Ambulatory Visit: Payer: Self-pay | Admitting: Gastroenterology

## 2021-08-12 NOTE — Chronic Care Management (AMB) (Signed)
  Care Management   Note  08/12/2021 Name: Maria Bentley MRN: [id number] DOB: [birth year]  Maria Bentley is a 50 y.o. year old female who is a primary care patient of [name], Aris Everts, MD and is actively engaged with the care management team. I reached out to Dorinda Hill by phone today to assist with re-scheduling an initial visit with the Pharmacist  Follow up plan: Telephone appointment with care management team member scheduled for:08/25/2021  Noreene Larsson, [place], [facility]  [place], [place] [address] Direct Dial: [phone] [email or web address]: [email or web address]

## 2021-08-17 ENCOUNTER — Other Ambulatory Visit: Payer: Self-pay

## 2021-08-17 ENCOUNTER — Ambulatory Visit
Admission: RE | Admit: 2021-08-17 | Discharge: 2021-08-17 | Disposition: A | Discharge status: Home / Self Care | Payer: BC Managed Care – PPO | Source: Ambulatory Visit | Attending: Internal Medicine | Admitting: Internal Medicine

## 2021-08-17 DIAGNOSIS — Z1231 Encounter for screening mammogram for malignant neoplasm of breast: Secondary | ICD-10-CM | POA: Diagnosis present

## 2021-08-19 ENCOUNTER — Other Ambulatory Visit: Payer: Self-pay

## 2021-08-19 ENCOUNTER — Ambulatory Visit
Admission: RE | Admit: 2021-08-19 | Discharge: 2021-08-19 | Disposition: A | Discharge status: Home / Self Care | Payer: BC Managed Care – PPO | Source: Ambulatory Visit | Attending: Emergency Medicine | Admitting: Emergency Medicine

## 2021-08-19 VITALS — BP 151/83 | HR 79 | Temp 97.8°F | Resp 20

## 2021-08-19 DIAGNOSIS — R300 Dysuria: Secondary | ICD-10-CM | POA: Diagnosis present

## 2021-08-19 DIAGNOSIS — R319 Hematuria, unspecified: Secondary | ICD-10-CM

## 2021-08-19 DIAGNOSIS — N390 Urinary tract infection, site not specified: Secondary | ICD-10-CM

## 2021-08-19 LAB — POCT URINALYSIS DIP (MANUAL ENTRY)
Bilirubin, UA: NEGATIVE
Glucose, UA: NEGATIVE mg/dL
Ketones, POC UA: NEGATIVE mg/dL
Nitrite, UA: POSITIVE — AB
Spec Grav, UA: 1.02 (ref 1.010–1.025)
Urobilinogen, UA: 0.2 E.U./dL
pH, UA: 5.5 (ref 5.0–8.0)

## 2021-08-19 MED ORDER — CEPHALEXIN 500 MG PO CAPS: 500.0000 mg | ORAL_CAPSULE | Freq: Two times a day (BID) | ORAL | 0 refills | Status: AC

## 2021-08-19 NOTE — Discharge Instructions (Addendum)
Take the antibiotic as directed.  The urine culture is pending.  We will call you if it shows the need to change or discontinue your antibiotic.    Follow up with your primary care provider if your symptoms are not improving.    

## 2021-08-19 NOTE — ED Provider Notes (Addendum)
[facility]    CSN: [id number] Arrival date & time: 08/19/21  1025      History   Chief Complaint Chief Complaint  Patient presents with   Dysuria    HPI Maria Bentley is a 50 y.o. female.  Patient presents with 2-day history of dysuria.  She denies fever, chills, abdominal pain, flank pain, vaginal discharge, pelvic pain, or other symptoms.  No treatments attempted at home.  Her medical history includes hypertension, diabetes, kidney stones.  The history is provided by the patient and medical records.   Past Medical History:  Diagnosis Date   Abscess of right buttock 04/30/2019   Anemia    Anxiety    COVID-19 virus infection    Depression 2010   Diabetes mellitus    DKA, type 2 ([place]) 10/13/2019   Dysrhythmia    Endometriosis    Headache, common migraine, intractable    History of kidney stones    History of ovarian cyst    left sided, caused by her endometriosis (possibly an endometrioma), in her 58s.   Hyperlipidemia    Hypertension    Kidney stones    left   Morbid obesity (HCC)    s/p gastric bypass   Personal hx of gastric bypass 2010   Rosacea    Vertigo     Patient Active Problem List   Diagnosis Date Noted   COVID-19 long hauler manifesting chronic concentration deficit 07/09/2021   Encounter for screening colonoscopy    Diarrhea 09/04/2020   Anemia in chronic kidney disease 08/25/2020   Hematuria 08/25/2020   Stage 3a chronic kidney disease ([place]) 08/25/2020   Analgesic overuse headache 08/24/2020   Atypical facial pain 08/24/2020   Bruxism (teeth grinding) 08/24/2020   Cervical syndrome 08/24/2020   Disorder of autonomic nervous system 08/24/2020   Elevated blood-pressure reading without diagnosis of hypertension 08/24/2020   Fibromyositis 08/24/2020   Temporomandibular joint disorder 08/24/2020   Hypoglycemic reaction 08/21/2020   Coronary atherosclerosis due to calcified coronary lesion 08/21/2020   Renal mass, left 07/06/2020    Chronic right shoulder pain 02/09/2020   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 disease ([place]) 01/26/2020   Recurrent headache 01/13/2020   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 12/30/2019   Elevated alkaline phosphatase level 11/21/2019   Hospital discharge follow-up 11/04/2019   Cervical spondylosis without myelopathy 10/29/2019   Pressure injury of skin 10/16/2019   History of gastric bypass 10/13/2019   COVID-19 virus infection 10/06/2019   Preoperative evaluation of a medical condition to rule out surgical contraindications (TAR required) 09/21/2019   Morbid obesity with BMI of 40.0-44.9, adult ([place]) 06/24/2019   History of bariatric surgery 04/15/2019   Cervicocranial syndrome 11/12/2018   Depressive disorder 11/12/2018   Fibromyalgia 06/05/2018   Iron deficiency anemia 06/05/2018   Enteritis, enteropathogenic E. 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   Morbid obesity ([place]) 06/09/2012   Anxiety    Headache, common migraine, intractable    Hyperlipidemia    Hypothyroid    OSA (obstructive sleep apnea) 02/05/2012    Past Surgical History:  Procedure Laterality Date   CARPAL TUNNEL RELEASE Right 11/26/2020   Procedure: CARPAL TUNNEL RELEASE;  Surgeon: Hessie Knows, MD;  Location: [facility];  Service: Orthopedics;  Laterality: Right;   COLONOSCOPY WITH PROPOFOL N/A 10/21/2020   Procedure: COLONOSCOPY WITH PROPOFOL;  Surgeon: Virgel Manifold, MD;  Location: [facility];  Service: Endoscopy;  Laterality: N/A;   DILATION AND CURETTAGE OF UTERUS  2011   gastric bypass      OPEN REDUCTION INTERNAL FIXATION (ORIF) DISTAL RADIAL FRACTURE Right 11/26/2020   Procedure: Open reduction internal fixation, right distal radius;  Surgeon: Hessie Knows, MD;  Location: [facility];  Service: Orthopedics;  Laterality: Right;   OVARIAN CYST REMOVAL     ROTATOR CUFF REPAIR     right   SHOULDER ARTHROSCOPY WITH SUBACROMIAL DECOMPRESSION AND OPEN ROTATOR C Right 07/19/2020   Procedure: Right shoulder arthroscopic vs mini-open rotator cuff repair vs Regeneten patch application, subacomial dcompression, and biceps tenodesis;  Surgeon: Leim Fabry, MD;  Location: [facility];  Service: Orthopedics;  Laterality: Right;    OB History     Gravida  0   Para  0   Term  0   Preterm  0   AB  0   Living  0      SAB  0   IAB  0   Ectopic  0   Multiple  0   Live Births  0            Home Medications    Prior to Admission medications   Medication Sig Start Date End Date Taking? Authorizing Provider  cephALEXin (KEFLEX) 500 MG capsule Take 1 capsule (500 mg total) by mouth 2 (two) times daily for 5 days. 08/19/21 08/24/21 Yes Sharion Balloon, NP  albuterol (VENTOLIN HFA) 108 (90 Base) MCG/ACT inhaler Inhale 2 puffs into the lungs every 6 (six) hours as needed for wheezing or shortness of breath. 10/02/19   Terald Sleeper, PA-C  amitriptyline (ELAVIL) 25 MG tablet TAKE 1 TABLET BY MOUTH EVERYDAY AT BEDTIME 08/08/21   [name], MD  aspirin-acetaminophen-caffeine (EXCEDRIN MIGRAINE) [phone] MG tablet Take 2 tablets by mouth every 6 (six) hours as needed for headache.    [provider]  atorvastatin (LIPITOR) 20 MG tablet TAKE 1 TABLET BY MOUTH EVERY DAY 06/28/21   [name], MD  buPROPion (WELLBUTRIN XL) 150 MG 24 hr tablet Take 450 mg by mouth daily.  05/12/18   [provider]  CLINPRO 5000 1.1 % PSTE Place 1 application onto teeth at bedtime.  06/15/20   [provider]  Continuous Blood Gluc Receiver (FREESTYLE LIBRE 14 DAY READER)  DEVI 1 applicator by Does not apply route 4 (four) times daily. 11/24/17   [name], MD  Continuous Blood Gluc Sensor (FREESTYLE LIBRE 14 DAY SENSOR) MISC USE TO CHECK BLOOD SUGAR FOUR TIMES A DAY AS NEEDED 09/28/20   [name], MD  CREON [phone] units CPEP capsule TAKE 2 CAPSULES BY MOUTH 3 (THREE) TIMES DAILY BEFORE MEALS AND 1 CAPSULE WITH SNACKS 08/08/21   [name] B, MD  cyanocobalamin (,VITAMIN B-12,) 1000 MCG/ML injection Inject 1 mL (1,000 mcg total) into the muscle once a week. For 4 weeks, then monthly thereafter 07/13/21   [name], MD  cyclobenzaprine (FLEXERIL) 10 MG tablet Take 0.5-1 tablets (5-10 mg total) by mouth 3 (three) times daily as needed for muscle spasms. 07/07/21   [name], MD  diphenhydrAMINE (BENADRYL) 25  MG tablet Take 25 mg by mouth every 6 (six) hours as needed for allergies.    [provider]  diphenoxylate-atropine (LOMOTIL) 2.5-0.025 MG tablet Take 1 tablet by mouth 4 (four) times daily as needed for diarrhea or loose stools. Patient not taking: Reported on 07/07/2021 08/06/20   [name], MD  doxycycline (VIBRAMYCIN) 100 MG capsule Take 1 capsule (100 mg total) by mouth 2 (two) times daily. 07/12/21   Tasia Catchings, Amy V, PA-C  EPINEPHrine 0.3 mg/0.3 mL IJ SOAJ injection Inject 0.3 mg into the muscle as needed for anaphylaxis.  06/25/10   [provider]  escitalopram (LEXAPRO) 20 MG tablet Take 20 mg by mouth daily.  02/27/17   [provider]  fluticasone (FLONASE) 50 MCG/ACT nasal spray SPRAY 2 SPRAYS INTO EACH NOSTRIL EVERY DAY 04/21/21   [name], MD  furosemide (LASIX) 20 MG tablet TAKE 1 TABLET (20 MG TOTAL) BY MOUTH DAILY. AS NEEDED FOR FLUID RETENTION Patient not taking: Reported on 07/07/2021 02/01/21   [name], MD  HORIZANT 600 MG TBCR Take 1 tablet (600 mg total) by mouth 2 (two) times daily. 07/07/21   [name], MD  Insulin Glargine [facility] KWIKPEN) 100 UNIT/ML INJECT 33 UNITS  INTO THE SKIN AT BEDTIME. 08/08/21   [name], MD  Insulin Pen Needle (BD PEN NEEDLE NANO U/F) 32G X 4 MM MISC Please specify directions, refills and quantity 08/22/18   [name], MD  lamoTRIgine (LAMICTAL) 150 MG tablet Take 150 mg by mouth daily.    [provider]  levothyroxine (SYNTHROID) 50 MCG tablet TAKE 1 TABLET BY MOUTH EVERY DAY 01/13/21   [name], MD  loperamide (IMODIUM) 2 MG capsule Take 2 capsules (4 mg total) by mouth as needed for diarrhea or loose stools. Patient not taking: Reported on 07/07/2021 10/25/19   Damita Lack, MD  LORazepam (ATIVAN) 0.5 MG tablet Take 0.5-1 mg by mouth daily as needed for anxiety.     [provider]  losartan (COZAAR) 25 MG tablet Take 1 tablet by mouth daily. 09/28/20 09/28/21  [provider]  meclizine (ANTIVERT) 25 MG tablet Take 1 tablet (25 mg total) by mouth 3 (three) times daily as needed for dizziness. 06/16/15   [name], MD  Melatonin 10 MG TABS Take 10 mg by mouth at bedtime as needed (sleep).     [provider]  metoprolol tartrate (LOPRESSOR) 25 MG tablet Take 0.5 tablets (12.5 mg total) by mouth 2 (two) times daily. 07/07/21   [name], MD  neomycin-polymyxin b-dexamethasone (MAXITROL) 3.5-10000-0.1 SUSP Place 1 drop into both eyes 2 (two) times daily. Patient not taking: Reported on 07/07/2021 11/25/20   [provider]  NOVOLOG FLEXPEN 100 UNIT/ML FlexPen INJECT 5-20 UNITS INTO THE SKIN 3 (THREE) TIMES DAILY WITH MEALS. INJECT 15 UNITS IN THE MORNING, 20 UNITS AT LUNCH AND 5-10 UNITS WITH DINNER 06/28/21   [name], MD  NURTEC 75 MG TBDP Take 1 tablet by mouth daily. 10/06/20   [provider]  nystatin (MYCOSTATIN/NYSTOP) powder APPLY 1 APPLICATION TO AFFECTED AREA TWICE A DAY 12/23/20   Leone Haven, MD  OnabotulinumtoxinA (BOTOX IJ) Inject as directed every 3 (three) months. For migraine    [provider]  ondansetron (ZOFRAN  ODT) 4 MG disintegrating tablet Take 1 tablet (4 mg total) by mouth every 8 (eight) hours as needed for nausea or vomiting. 07/19/20   Leim Fabry,  MD  oxyCODONE (ROXICODONE) 5 MG immediate release tablet Take 1 tablet (5 mg total) by mouth every 8 (eight) hours as needed. 11/26/20 11/26/21  Hessie Knows, MD  Rimegepant Sulfate (NURTEC PO) Take by mouth as needed.    [provider]  SYRINGE-NEEDLE, DISP, 3 ML (BD INTEGRA SYRINGE) 25G X 1" 3 ML MISC FOR USE WITH B12 INJECTIONS WEEKLY/MONTHLY Patient not taking: Reported on 07/07/2021 08/01/18   [name], MD    Family History Family History  Problem Relation Age of Onset   Diabetes Mother    Hypertension Mother    Hypertension Father    Diabetes Father    Depression Other        strong hx of   Breast cancer Paternal Grandmother 73   Breast cancer Maternal Grandmother    Cancer Neg Hx     Social History Social History   Tobacco Use   Smoking status: Former    Packs/day: 1.00    Years: 4.00    Pack years: 4.00    Types: Cigarettes    Quit date: 02/05/1996    Years since quitting: 25.5   Smokeless tobacco: Never  Vaping Use   Vaping Use: Never used  Substance Use Topics   Alcohol use: No   Drug use: No     Allergies   Amoxicillin, Avocado, Clarithromycin, Clarithromycin, Honey, Other, Sulfa antibiotics, Sulfa drugs cross reactors, Watermelon concentrate, Insulin detemir, Tape, and Victoza [liraglutide]   Review of Systems Review of Systems  Constitutional:  Negative for chills and fever.  Respiratory:  Negative for cough and shortness of breath.   Cardiovascular:  Negative for chest pain and palpitations.  Gastrointestinal:  Negative for abdominal pain and vomiting.  Genitourinary:  Positive for dysuria. Negative for frequency, hematuria, pelvic pain and vaginal discharge.  Skin:  Negative for color change and rash.  All other systems reviewed and are negative.   Physical Exam Triage Vital Signs ED  Triage Vitals  Enc Vitals Group     BP      Pulse      Resp      Temp      Temp src      SpO2      Weight      Height      Head Circumference      Peak Flow      Pain Score      Pain Loc      Pain Edu?      Excl. in [place]?    No data found.  Updated Vital Signs BP (!) 151/83   Pulse 79   Temp 97.8 F (36.6 C)   Resp 20   LMP 05/09/2016 (LMP Unknown) Comment: D/C  SpO2 96%   Visual Acuity Right Eye Distance:   Left Eye Distance:   Bilateral Distance:    Right Eye Near:   Left Eye Near:    Bilateral Near:     Physical Exam Vitals and nursing note reviewed.  Constitutional:      General: She is not in acute distress.    Appearance: She is well-developed. She is obese. She is not ill-appearing.  HENT:     Head: Normocephalic and atraumatic.     Mouth/Throat:     Mouth: Mucous membranes are moist.  Eyes:     Conjunctiva/sclera: Conjunctivae normal.  Cardiovascular:     Rate and Rhythm: Normal rate and regular rhythm.     Heart sounds: Normal heart sounds.  Pulmonary:     Effort: Pulmonary effort is normal. No respiratory distress.     Breath sounds: Normal breath sounds.  Abdominal:     Palpations: Abdomen is soft.     Tenderness: There is no abdominal tenderness. There is no right CVA tenderness, left CVA tenderness, guarding or rebound.  Musculoskeletal:     Cervical back: Neck supple.  Skin:    General: Skin is warm and dry.  Neurological:     General: No focal deficit present.     Mental Status: She is alert and oriented to person, place, and time.     Gait: Gait normal.  Psychiatric:        Mood and Affect: Mood normal.        Behavior: Behavior normal.     UC Treatments / Results  Labs (all labs ordered are listed, but only abnormal results are displayed) Labs Reviewed  POCT URINALYSIS DIP (MANUAL ENTRY) - Abnormal; Notable for the following components:      Result Value   Blood, UA trace-intact (*)    Protein Ur, POC trace (*)    Nitrite,  UA Positive (*)    Leukocytes, UA Small (1+) (*)    All other components within normal limits  URINE CULTURE    EKG   Radiology MM 3D SCREEN BREAST BILATERAL  Result Date: 08/18/2021 CLINICAL DATA:  Screening. EXAM: DIGITAL SCREENING BILATERAL MAMMOGRAM WITH TOMOSYNTHESIS AND CAD TECHNIQUE: Bilateral screening digital craniocaudal and mediolateral oblique mammograms were obtained. Bilateral screening digital breast tomosynthesis was performed. The images were evaluated with computer-aided detection. COMPARISON:  Previous exam(s). ACR Breast Density Category b: There are scattered areas of fibroglandular density. FINDINGS: There are no findings suspicious for malignancy. IMPRESSION: No mammographic evidence of malignancy. A result letter of this screening mammogram will be mailed directly to the patient. RECOMMENDATION: Screening mammogram in one year. (Code:SM-B-01Y) BI-RADS CATEGORY  1: Negative. Electronically Signed   By: Lillia Mountain M.D.   On: 08/18/2021 16:31   Procedures Procedures (including critical care time)  Medications Ordered in UC Medications - No data to display  Initial Impression / Assessment and Plan / UC Course  I have reviewed the triage vital signs and the nursing notes.  Pertinent labs & imaging results that were available during my care of the patient were reviewed by me and considered in my medical decision making (see chart for details).   UTI.  Treating with Keflex; Patient has an allergy to penicillin but states she has taken Keflex in the past without difficulty.  Urine culture pending. Discussed with patient that we will call her if the urine culture shows the need to change or discontinue the antibiotic. Instructed her to follow-up with her PCP if her symptoms are not improving. Patient agrees to plan of care.      Final Clinical Impressions(s) / UC Diagnoses   Final diagnoses:  Urinary tract infection with hematuria, site unspecified     Discharge  Instructions      Take the antibiotic as directed.  The urine culture is pending.  We will call you if it shows the need to change or discontinue your antibiotic.    Follow up with your primary care provider if your symptoms are not improving.         ED Prescriptions     Medication Sig Dispense Auth. Provider   cephALEXin (KEFLEX) 500 MG capsule Take 1 capsule (500 mg total) by mouth 2 (two) times daily for 5 days.  10 capsule Sharion Balloon, NP      PDMP not reviewed this encounter.   Sharion Balloon, NP 08/19/21 1100    Sharion Balloon, NP 08/19/21 1103

## 2021-08-19 NOTE — ED Triage Notes (Signed)
Pt presents with c/o dysuria that began a couple days ago

## 2021-08-21 LAB — URINE CULTURE: Culture: >=100000 — AB

## 2021-08-25 ENCOUNTER — Telehealth: Payer: Self-pay | Admitting: Pharmacist

## 2021-08-25 ENCOUNTER — Telehealth: Payer: BC Managed Care – PPO

## 2021-08-25 NOTE — Telephone Encounter (Signed)
  Chronic Care Management   Note  08/25/2021 Name: Karolyne Buechel MRN: [id number] DOB: [birth year]   Attempted to contact patient for scheduled appointment for medication management support. Left HIPAA compliant message for patient to return my call at their convenience.    Plan: - If I do not hear back from the patient by end of business today, will collaborate with Care Guide to outreach to schedule follow up with me   Catie Darnelle Maffucci, PharmD, [place], [place] Pharmacist [employer] [facility] [employer] [phone]

## 2021-08-30 ENCOUNTER — Ambulatory Visit: Payer: BC Managed Care – PPO | Admitting: Gastroenterology

## 2021-08-30 ENCOUNTER — Other Ambulatory Visit: Payer: Self-pay

## 2021-08-30 DIAGNOSIS — K8689 Other specified diseases of pancreas: Secondary | ICD-10-CM | POA: Diagnosis not present

## 2021-08-30 DIAGNOSIS — Z1211 Encounter for screening for malignant neoplasm of colon: Secondary | ICD-10-CM | POA: Diagnosis not present

## 2021-08-30 DIAGNOSIS — R748 Abnormal levels of other serum enzymes: Secondary | ICD-10-CM | POA: Diagnosis not present

## 2021-08-30 MED ORDER — CLENPIQ 10-3.5-12 MG-GM -GM/160ML PO SOLN: ORAL | 0 refills | Status: DC

## 2021-08-30 NOTE — Progress Notes (Signed)
[name], MD [address]  [place]  [place], [place] [address]  Main: [phone]  Fax: [phone]   Primary Care Physician: [name], MD   Chief complaint: Pancreatic insufficiency  HPI: Maria Bentley is a 50 y.o. female here for follow-up for pancreatic insufficiency.  Taking Creon, 2 capsules with meals, 1 with snacks and reports complete resolution of diarrhea.  No bloating.  No nausea or vomiting.  History of however on gastric bypass in 2010.  No blood in stool.  Screening colonoscopy last year showed a poor prep and procedure was aborted and patient has not rescheduled since then.  She states she got a gallon prep and could not drink it.   ROS: All ROS reviewed and negative except as per HPI   Past Medical History:  Diagnosis Date   Abscess of right buttock 04/30/2019   Anemia    Anxiety    COVID-19 virus infection    Depression 2010   Diabetes mellitus    DKA, type 2 ([place]) 10/13/2019   Dysrhythmia    Endometriosis    Headache, common migraine, intractable    History of kidney stones    History of ovarian cyst    left sided, caused by her endometriosis (possibly an endometrioma), in her 55s.   Hyperlipidemia    Hypertension    Kidney stones    left   Morbid obesity ([place])    s/p gastric bypass   Personal hx of gastric bypass 2010   Rosacea    Vertigo     Past Surgical History:  Procedure Laterality Date   CARPAL TUNNEL RELEASE Right 11/26/2020   Procedure: CARPAL TUNNEL RELEASE;  Surgeon: Hessie Knows, MD;  Location: [facility];  Service: Orthopedics;  Laterality: Right;   COLONOSCOPY WITH PROPOFOL N/A 10/21/2020   Procedure: COLONOSCOPY WITH PROPOFOL;  Surgeon: Virgel Manifold, MD;  Location: [facility];  Service: Endoscopy;  Laterality: N/A;   DILATION AND CURETTAGE OF UTERUS  2011   gastric bypass     OPEN REDUCTION INTERNAL FIXATION (ORIF) DISTAL RADIAL FRACTURE Right 11/26/2020   Procedure: Open reduction  internal fixation, right distal radius;  Surgeon: Hessie Knows, MD;  Location: [facility];  Service: Orthopedics;  Laterality: Right;   OVARIAN CYST REMOVAL     ROTATOR CUFF REPAIR     right   SHOULDER ARTHROSCOPY WITH SUBACROMIAL DECOMPRESSION AND OPEN ROTATOR C Right 07/19/2020   Procedure: Right shoulder arthroscopic vs mini-open rotator cuff repair vs Regeneten patch application, subacomial dcompression, and biceps tenodesis;  Surgeon: Leim Fabry, MD;  Location: [facility];  Service: Orthopedics;  Laterality: Right;    Prior to Admission medications   Medication Sig Start Date End Date Taking? Authorizing Provider  albuterol (VENTOLIN HFA) 108 (90 Base) MCG/ACT inhaler Inhale 2 puffs into the lungs every 6 (six) hours as needed for wheezing or shortness of breath. 10/02/19  Yes Terald Sleeper, PA-C  amitriptyline (ELAVIL) 25 MG tablet TAKE 1 TABLET BY MOUTH EVERYDAY AT BEDTIME 08/08/21  Yes [name], MD  aspirin-acetaminophen-caffeine (EXCEDRIN MIGRAINE) [phone] MG tablet Take 2 tablets by mouth every 6 (six) hours as needed for headache.   Yes [provider]  atorvastatin (LIPITOR) 20 MG tablet TAKE 1 TABLET BY MOUTH EVERY DAY 06/28/21  Yes [name], MD  buPROPion (WELLBUTRIN XL) 150 MG 24 hr tablet Take 450 mg by mouth daily.  05/12/18  Yes [provider]  CLINPRO 5000 1.1 % PSTE Place  1 application onto teeth at bedtime.  06/15/20  Yes [provider]  Continuous Blood Gluc Receiver (FREESTYLE LIBRE 14 DAY READER) DEVI 1 applicator by Does not apply route 4 (four) times daily. 11/24/17  Yes [name], MD  Continuous Blood Gluc Sensor (FREESTYLE LIBRE 14 DAY SENSOR) MISC USE TO CHECK BLOOD SUGAR FOUR TIMES A DAY AS NEEDED 09/28/20  Yes [name], MD  CREON [phone] units CPEP capsule TAKE 2 CAPSULES BY MOUTH 3 (THREE) TIMES DAILY BEFORE MEALS AND 1 CAPSULE WITH SNACKS 08/08/21  Yes [name] B, MD  cyanocobalamin (,VITAMIN B-12,)  1000 MCG/ML injection Inject 1 mL (1,000 mcg total) into the muscle once a week. For 4 weeks, then monthly thereafter 07/13/21  Yes [name], MD  cyclobenzaprine (FLEXERIL) 10 MG tablet Take 0.5-1 tablets (5-10 mg total) by mouth 3 (three) times daily as needed for muscle spasms. 07/07/21  Yes [name], MD  diphenhydrAMINE (BENADRYL) 25 MG tablet Take 25 mg by mouth every 6 (six) hours as needed for allergies.   Yes [provider]  EPINEPHrine 0.3 mg/0.3 mL IJ SOAJ injection Inject 0.3 mg into the muscle as needed for anaphylaxis.  06/25/10  Yes [provider]  escitalopram (LEXAPRO) 20 MG tablet Take 20 mg by mouth daily.  02/27/17  Yes [provider]  fluticasone (FLONASE) 50 MCG/ACT nasal spray SPRAY 2 SPRAYS INTO EACH NOSTRIL EVERY DAY 04/21/21  Yes [name], MD  furosemide (LASIX) 20 MG tablet TAKE 1 TABLET (20 MG TOTAL) BY MOUTH DAILY. AS NEEDED FOR FLUID RETENTION 02/01/21  Yes [name], MD  HORIZANT 600 MG TBCR Take 1 tablet (600 mg total) by mouth 2 (two) times daily. 07/07/21  Yes [name], MD  Insulin Glargine (BASAGLAR KWIKPEN) 100 UNIT/ML INJECT 33 UNITS INTO THE SKIN AT BEDTIME. 08/08/21  Yes [name], MD  Insulin Pen Needle (BD PEN NEEDLE NANO U/F) 32G X 4 MM MISC Please specify directions, refills and quantity 08/22/18  Yes [name], MD  lamoTRIgine (LAMICTAL) 150 MG tablet Take 150 mg by mouth daily.   Yes [provider]  levothyroxine (SYNTHROID) 50 MCG tablet TAKE 1 TABLET BY MOUTH EVERY DAY 01/13/21  Yes [name], MD  loperamide (IMODIUM) 2 MG capsule Take 2 capsules (4 mg total) by mouth as needed for diarrhea or loose stools. 10/25/19  Yes Amin, Ankit Chirag, MD  LORazepam (ATIVAN) 0.5 MG tablet Take 0.5-1 mg by mouth daily as needed for anxiety.    Yes [provider]  losartan (COZAAR) 25 MG tablet Take 1 tablet by mouth daily. 09/28/20 09/28/21 Yes [provider]  meclizine  (ANTIVERT) 25 MG tablet Take 1 tablet (25 mg total) by mouth 3 (three) times daily as needed for dizziness. 06/16/15  Yes [name], MD  Melatonin 10 MG TABS Take 10 mg by mouth at bedtime as needed (sleep).    Yes [provider]  metoprolol tartrate (LOPRESSOR) 25 MG tablet Take 0.5 tablets (12.5 mg total) by mouth 2 (two) times daily. 07/07/21  Yes [name], MD  NOVOLOG FLEXPEN 100 UNIT/ML FlexPen INJECT 5-20 UNITS INTO THE SKIN 3 (THREE) TIMES DAILY WITH MEALS. INJECT 15 UNITS IN THE MORNING, 20 UNITS AT LUNCH AND 5-10 UNITS WITH DINNER 06/28/21  Yes [name], MD  nystatin (MYCOSTATIN/NYSTOP) powder APPLY 1 APPLICATION TO AFFECTED AREA TWICE A DAY 12/23/20  Yes Leone Haven, MD  OnabotulinumtoxinA (BOTOX IJ) Inject as directed every 3 (three) months. For migraine   Yes [provider]  ondansetron (ZOFRAN ODT) 4 MG disintegrating tablet Take 1 tablet (4 mg total) by mouth every 8 (eight) hours as needed for nausea or vomiting. 07/19/20  Yes Leim Fabry, MD  Rimegepant Sulfate (NURTEC PO) Take by mouth as needed.   Yes [provider]  Sod Picosulfate-Mag Ox-Cit Acd (CLENPIQ) 10-3.5-12 MG-GM -GM/160ML SOLN 5pm night before procedure drink 1 bottle, 5 hours procedure drink 1 bottle 08/30/21  Yes Ahyana Skillin B, MD  SYRINGE-NEEDLE, DISP, 3 ML (BD INTEGRA SYRINGE) 25G X 1" 3 ML MISC FOR USE WITH B12 INJECTIONS WEEKLY/MONTHLY 08/01/18  Yes [name], MD    Family History  Problem Relation Age of Onset   Diabetes Mother    Hypertension Mother    Hypertension Father    Diabetes Father    Depression Other        strong hx of   Breast cancer Paternal Grandmother 42   Breast cancer Maternal Grandmother    Cancer Neg Hx      Social History   Tobacco Use   Smoking status: Former    Packs/day: 1.00    Years: 4.00    Pack years: 4.00    Types: Cigarettes    Quit date: 02/05/1996    Years since quitting: 25.5   Smokeless tobacco: Never   Vaping Use   Vaping Use: Never used  Substance Use Topics   Alcohol use: No   Drug use: No    Allergies as of 08/30/2021 - Review Complete 08/30/2021  Allergen Reaction Noted   Amoxicillin Hives, Rash, Itching, and Swelling 02/05/2012   Avocado Swelling 05/29/2015   Clarithromycin Hives and Rash 02/05/2012   Clarithromycin Anaphylaxis, Hives, and Rash 02/05/2012   Honey Anaphylaxis 04/02/2012   Other Hives and Swelling 05/29/2015   Sulfa antibiotics Anaphylaxis 02/05/2012   Sulfa drugs cross reactors Anaphylaxis 02/05/2012   Watermelon concentrate Hives and Swelling 04/02/2012   Insulin detemir Rash 06/11/2018   Tape Itching and Rash 04/02/2012   Victoza [liraglutide] Nausea Only 06/11/2018    Physical Examination:  Constitutional: General:   Alert,  Well-developed, well-nourished, pleasant and cooperative in NAD LMP 05/09/2016 (LMP Unknown) Comment: D/C  Respiratory: Normal respiratory effort  Gastrointestinal:  Soft, non-tender and non-distended without masses, hepatosplenomegaly or hernias noted.  No guarding or rebound tenderness.     Cardiac: No clubbing or edema.  No cyanosis. Normal posterior tibial pedal pulses noted.  Psych:  Alert and cooperative. Normal mood and affect.  Musculoskeletal:  Normal gait. Head normocephalic, atraumatic. Symmetrical without gross deformities. 5/5 Lower extremity strength bilaterally.  Skin: Warm. Intact without significant lesions or rashes. No jaundice.  Neck: Supple, trachea midline  Lymph: No cervical lymphadenopathy  Psych:  Alert and oriented x3, Alert and cooperative. Normal mood and affect.  Labs: CMP     Component Value Date/Time   NA 139 07/07/2021 1408   NA 135 06/23/2020 1643   K 4.4 07/07/2021 1408   CL 104 07/07/2021 1408   CO2 23 07/07/2021 1408   GLUCOSE 146 (H) 07/07/2021 1408   BUN 27 (H) 07/07/2021 1408   BUN 18 06/23/2020 1643   CREATININE 1.21 (H) 07/07/2021 1408   CREATININE 1.16 (H) 09/19/2019  1646   CALCIUM 9.3 07/07/2021 1408   PROT 6.5 07/07/2021 1408   PROT 5.9 (L) 06/23/2020 1643   ALBUMIN 3.6 07/07/2021 1408   ALBUMIN 3.5 (L) 06/23/2020 1643  AST 20 07/07/2021 1408   ALT 33 07/07/2021 1408   ALKPHOS 123 (H) 07/07/2021 1408   BILITOT 0.3 07/07/2021 1408   BILITOT <0.2 06/23/2020 1643   GFRNONAA 53 (L) 07/22/2020 1613   GFRNONAA >60 09/05/2013 0939   GFRNONAA 85 06/07/2012 1202   GFRAA >60 07/22/2020 1613   GFRAA >60 09/05/2013 0939   GFRAA >89 06/07/2012 1202   Lab Results  Component Value Date   WBC 10.6 (H) 07/22/2020   HGB 11.4 (L) 07/22/2020   HCT 38.3 07/22/2020   MCV 98.0 07/22/2020   PLT 404 (H) 07/22/2020    Imaging Studies:   Assessment and Plan:   Maria Bentley is a 50 y.o. y/o female with history of Roux-en-Y gastric bypass and pancreatic insufficiency, here for follow-up  Doing well from pancreatic insufficiency standpoint Reporting no further diarrhea Continue current dose of Creon Imaging not indicated at this time given etiology of pancreatic insufficiency is known, i.e. her Roux-en-Y gastric bypass  Screening colonoscopy at this time.  Patient requesting lower volume prep. This will be sent to her pharmacy. Importance of following prep instruction discussed   I have discussed alternative options, risks & benefits,  which include, but are not limited to, bleeding, infection, perforation,respiratory complication & drug reaction.  The patient agrees with this plan & written consent will be obtained.     Patient was noted to have elevated alkaline phosphatase in the past.  We will repeat along with GGT    Dr [name]

## 2021-08-31 LAB — HEPATIC FUNCTION PANEL
ALT: 14 IU/L (ref 0–32)
AST: 15 IU/L (ref 0–40)
Albumin: 3.4 g/dL — ABNORMAL LOW (ref 3.8–4.9)
Alkaline Phosphatase: 135 IU/L — ABNORMAL HIGH (ref 44–121)
Bilirubin Total: 0.2 mg/dL (ref 0.0–1.2)
Bilirubin, Direct: 0.1 mg/dL (ref 0.00–0.40)
Total Protein: 6.4 g/dL (ref 6.0–8.5)

## 2021-08-31 LAB — GAMMA GT: GGT: 7 IU/L (ref 0–60)

## 2021-08-31 NOTE — Telephone Encounter (Signed)
Patient has been rescheduled.

## 2021-09-08 ENCOUNTER — Telehealth: Payer: Self-pay | Admitting: Pharmacist

## 2021-09-08 ENCOUNTER — Telehealth: Payer: BC Managed Care – PPO

## 2021-09-08 NOTE — Telephone Encounter (Signed)
  Chronic Care Management   Note  09/08/2021 Name: Maria Bentley MRN: [id number] DOB: 01-25-1971   Attempted to contact patient for scheduled appointment for medication management support. Left HIPAA compliant message for patient to return my call at their convenience.  Will also send MyChart message  Plan: - If I do not hear back from the patient by end of business today, will collaborate with Care Guide to outreach to schedule follow up with me  Catie Darnelle Maffucci, PharmD, [place], [place] Pharmacist [employer] [facility] [place]

## 2021-09-09 ENCOUNTER — Emergency Department
Admission: EM | Admit: 2021-09-09 | Discharge: 2021-09-09 | Disposition: A | Discharge status: Home / Self Care | Payer: BC Managed Care – PPO | Attending: Emergency Medicine | Admitting: Emergency Medicine

## 2021-09-09 ENCOUNTER — Ambulatory Visit: Payer: BC Managed Care – PPO

## 2021-09-09 ENCOUNTER — Other Ambulatory Visit: Payer: Self-pay

## 2021-09-09 DIAGNOSIS — E1122 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 disease: Secondary | ICD-10-CM | POA: Insufficient documentation

## 2021-09-09 DIAGNOSIS — Z8616 Personal history of COVID-19: Secondary | ICD-10-CM | POA: Diagnosis not present

## 2021-09-09 DIAGNOSIS — E1110 Type 2 diabetes mellitus with ketoacidosis without coma: Secondary | ICD-10-CM | POA: Diagnosis not present

## 2021-09-09 DIAGNOSIS — E039 Hypothyroidism, unspecified: Secondary | ICD-10-CM | POA: Diagnosis not present

## 2021-09-09 DIAGNOSIS — Z87891 Personal history of nicotine dependence: Secondary | ICD-10-CM | POA: Insufficient documentation

## 2021-09-09 DIAGNOSIS — N1831 Chronic kidney disease, stage 3a: Secondary | ICD-10-CM | POA: Insufficient documentation

## 2021-09-09 DIAGNOSIS — I129 Hypertensive chronic kidney disease with stage 1 through stage 4 chronic kidney disease, or unspecified chronic kidney disease: Secondary | ICD-10-CM | POA: Insufficient documentation

## 2021-09-09 DIAGNOSIS — Z794 Long term (current) use of insulin: Secondary | ICD-10-CM | POA: Diagnosis not present

## 2021-09-09 DIAGNOSIS — Z79899 Other long term (current) drug therapy: Secondary | ICD-10-CM | POA: Diagnosis not present

## 2021-09-09 DIAGNOSIS — R350 Frequency of micturition: Secondary | ICD-10-CM | POA: Diagnosis present

## 2021-09-09 DIAGNOSIS — N390 Urinary tract infection, site not specified: Secondary | ICD-10-CM | POA: Diagnosis not present

## 2021-09-09 LAB — URINALYSIS, COMPLETE (UACMP) WITH MICROSCOPIC
Bilirubin Urine: NEGATIVE
Glucose, UA: NEGATIVE mg/dL
Hgb urine dipstick: NEGATIVE
Ketones, ur: NEGATIVE mg/dL
Nitrite: NEGATIVE
Protein, ur: NEGATIVE mg/dL
Specific Gravity, Urine: 1.015 (ref 1.005–1.030)
pH: 6 (ref 5.0–8.0)

## 2021-09-09 MED ORDER — PHENAZOPYRIDINE HCL 200 MG PO TABS: 200.0000 mg | ORAL_TABLET | Freq: Three times a day (TID) | ORAL | 0 refills | Status: DC | PRN

## 2021-09-09 MED ORDER — DOXYCYCLINE MONOHYDRATE 100 MG PO CAPS: 100.0000 mg | ORAL_CAPSULE | Freq: Two times a day (BID) | ORAL | 0 refills | Status: DC

## 2021-09-09 NOTE — Discharge Instructions (Addendum)
Read and follow discharge care instruction.  Take medication as directed.  Be advised culture is pending.

## 2021-09-09 NOTE — ED Provider Notes (Signed)
[facility] Emergency Department Provider Note   ____________________________________________   Event Date/Time   First MD Initiated Contact with Patient 09/09/21 [phone]     (approximate)  I have reviewed the triage vital signs and the nursing notes.   HISTORY  Chief Complaint Urinary Frequency    HPI Maria Bentley is a 50 y.o. female patient presents with 4 to 5 days of urinary frequency, urgency, dysuria.  Patient denies vaginal discharge or fever.  Patient denies flank pain.  Patient was first diagnosed and treated with Macrobid for UTI on 08/29/2021.  Urine culture showed resistance to Macrobid.  Patient stated her complaint has never fully resolved.         Past Medical History:  Diagnosis Date   Abscess of right buttock 04/30/2019   Anemia    Anxiety    COVID-19 virus infection    Depression 2010   Diabetes mellitus    DKA, type 2 ([place]) 10/13/2019   Dysrhythmia    Endometriosis    Headache, common migraine, intractable    History of kidney stones    History of ovarian cyst    left sided, caused by her endometriosis (possibly an endometrioma), in her 16s.   Hyperlipidemia    Hypertension    Kidney stones    left   Morbid obesity (HCC)    s/p gastric bypass   Personal hx of gastric bypass 2010   Rosacea    Vertigo     Patient Active Problem List   Diagnosis Date Noted   COVID-19 long hauler manifesting chronic concentration deficit 07/09/2021   Encounter for screening colonoscopy    Diarrhea 09/04/2020   Anemia in chronic kidney disease 08/25/2020   Hematuria 08/25/2020   Stage 3a chronic kidney disease ([place]) 08/25/2020   Analgesic overuse headache 08/24/2020   Atypical facial pain 08/24/2020   Bruxism (teeth grinding) 08/24/2020   Cervical syndrome 08/24/2020   Disorder of autonomic nervous system 08/24/2020   Elevated blood-pressure reading without diagnosis of hypertension 08/24/2020   Fibromyositis 08/24/2020    Temporomandibular joint disorder 08/24/2020   Hypoglycemic reaction 08/21/2020   Coronary atherosclerosis due to calcified coronary lesion 08/21/2020   Renal mass, left 07/06/2020   Chronic right shoulder pain 02/09/2020   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 disease ([place]) 01/26/2020   Recurrent headache 01/13/2020   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 12/30/2019   Elevated alkaline phosphatase level 11/21/2019   Hospital discharge follow-up 11/04/2019   Cervical spondylosis without myelopathy 10/29/2019   Pressure injury of skin 10/16/2019   History of gastric bypass 10/13/2019   COVID-19 virus infection 10/06/2019   Preoperative evaluation of a medical condition to rule out surgical contraindications (TAR required) 09/21/2019   Morbid obesity with BMI of 40.0-44.9, adult ([place]) 06/24/2019   History of bariatric surgery 04/15/2019   Cervicocranial syndrome 11/12/2018   Depressive disorder 11/12/2018   Fibromyalgia 06/05/2018   Iron deficiency anemia 06/05/2018   Enteritis, enteropathogenic E. 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   Morbid obesity ([place]) 06/09/2012   Anxiety    Headache,  common migraine, intractable    Hyperlipidemia    Hypothyroid    OSA (obstructive sleep apnea) 02/05/2012    Past Surgical History:  Procedure Laterality Date   CARPAL TUNNEL RELEASE Right 11/26/2020   Procedure: CARPAL TUNNEL RELEASE;  Surgeon: Hessie Knows, MD;  Location: [facility] ORS;  Service: Orthopedics;  Laterality: Right;   COLONOSCOPY WITH PROPOFOL N/A 10/21/2020   Procedure:  COLONOSCOPY WITH PROPOFOL;  Surgeon: Virgel Manifold, MD;  Location: [facility] ENDOSCOPY;  Service: Endoscopy;  Laterality: N/A;   DILATION AND CURETTAGE OF UTERUS  2011   gastric bypass     OPEN REDUCTION INTERNAL FIXATION (ORIF) DISTAL RADIAL FRACTURE Right 11/26/2020   Procedure: Open reduction internal fixation, right distal radius;  Surgeon: Hessie Knows, MD;  Location: [facility] ORS;  Service: Orthopedics;  Laterality: Right;   OVARIAN CYST REMOVAL     ROTATOR CUFF REPAIR     right   SHOULDER ARTHROSCOPY WITH SUBACROMIAL DECOMPRESSION AND OPEN ROTATOR C Right 07/19/2020   Procedure: Right shoulder arthroscopic vs mini-open rotator cuff repair vs Regeneten patch application, subacomial dcompression, and biceps tenodesis;  Surgeon: Leim Fabry, MD;  Location: [facility] ORS;  Service: Orthopedics;  Laterality: Right;    Prior to Admission medications   Medication Sig Start Date End Date Taking? Authorizing Provider  doxycycline (MONODOX) 100 MG capsule Take 1 capsule (100 mg total) by mouth 2 (two) times daily. 09/09/21  Yes Sable Feil, PA-C  phenazopyridine (PYRIDIUM) 200 MG tablet Take 1 tablet (200 mg total) by mouth 3 (three) times daily as needed for pain. 09/09/21  Yes Sable Feil, PA-C  albuterol (VENTOLIN HFA) 108 (90 Base) MCG/ACT inhaler Inhale 2 puffs into the lungs every 6 (six) hours as needed for wheezing or shortness of breath. 10/02/19   Terald Sleeper, PA-C  amitriptyline (ELAVIL) 25 MG tablet TAKE 1 TABLET BY MOUTH EVERYDAY AT BEDTIME 08/08/21   [name], MD  aspirin-acetaminophen-caffeine (EXCEDRIN MIGRAINE) [phone] MG tablet Take 2 tablets by mouth every 6 (six) hours as needed for headache.    [provider]  atorvastatin (LIPITOR) 20 MG tablet TAKE 1 TABLET BY MOUTH EVERY DAY 06/28/21   [name], MD  buPROPion (WELLBUTRIN XL) 150 MG 24 hr tablet Take 450 mg by mouth daily.  05/12/18   [provider]  CLINPRO 5000 1.1 % PSTE Place 1  application onto teeth at bedtime.  06/15/20   [provider]  Continuous Blood Gluc Receiver (FREESTYLE LIBRE 14 DAY READER) DEVI 1 applicator by Does not apply route 4 (four) times daily. 11/24/17   [name], MD  Continuous Blood Gluc Sensor (FREESTYLE LIBRE 14 DAY SENSOR) MISC USE TO CHECK BLOOD SUGAR FOUR TIMES A DAY AS NEEDED 09/28/20   [name], MD  CREON [phone] units CPEP capsule TAKE 2 CAPSULES BY MOUTH 3 (THREE) TIMES DAILY BEFORE MEALS AND 1 CAPSULE WITH SNACKS 08/08/21   [name] B, MD  cyanocobalamin (,VITAMIN B-12,) 1000 MCG/ML injection Inject 1 mL (1,000 mcg total) into the muscle once a week. For 4 weeks, then monthly thereafter 07/13/21   [name], MD  cyclobenzaprine (FLEXERIL) 10 MG tablet Take 0.5-1 tablets (5-10 mg total) by mouth 3 (three) times daily as needed for muscle spasms. 07/07/21   [name], MD  diphenhydrAMINE (BENADRYL) 25 MG tablet Take 25 mg by mouth every 6 (six) hours as needed for allergies.    [provider]  EPINEPHrine 0.3 mg/0.3 mL IJ  SOAJ injection Inject 0.3 mg into the muscle as needed for anaphylaxis.  06/25/10   [provider]  escitalopram (LEXAPRO) 20 MG tablet Take 20 mg by mouth daily.  02/27/17   [provider]  fluticasone (FLONASE) 50 MCG/ACT nasal spray SPRAY 2 SPRAYS INTO EACH NOSTRIL EVERY DAY 04/21/21   [name], MD  furosemide (LASIX) 20 MG tablet TAKE 1 TABLET (20 MG TOTAL) BY MOUTH DAILY. AS NEEDED FOR FLUID RETENTION 02/01/21   [name], MD  HORIZANT 600 MG TBCR Take 1 tablet (600 mg total) by mouth 2 (two) times daily. 07/07/21   [name], MD  Insulin Glargine [facility] KWIKPEN) 100 UNIT/ML INJECT 33 UNITS INTO THE SKIN AT BEDTIME. 08/08/21   [name], MD  Insulin Pen Needle (BD PEN NEEDLE NANO U/F) 32G X 4 MM MISC Please specify directions, refills and quantity 08/22/18   [name], MD  lamoTRIgine (LAMICTAL) 150 MG tablet Take 150 mg  by mouth daily.    [provider]  levothyroxine (SYNTHROID) 50 MCG tablet TAKE 1 TABLET BY MOUTH EVERY DAY 01/13/21   [name], MD  loperamide (IMODIUM) 2 MG capsule Take 2 capsules (4 mg total) by mouth as needed for diarrhea or loose stools. 10/25/19   Amin, Jeanella Flattery, MD  LORazepam (ATIVAN) 0.5 MG tablet Take 0.5-1 mg by mouth daily as needed for anxiety.     [provider]  losartan (COZAAR) 25 MG tablet Take 1 tablet by mouth daily. 09/28/20 09/28/21  [provider]  meclizine (ANTIVERT) 25 MG tablet Take 1 tablet (25 mg total) by mouth 3 (three) times daily as needed for dizziness. 06/16/15   [name], MD  Melatonin 10 MG TABS Take 10 mg by mouth at bedtime as needed (sleep).     [provider]  metoprolol tartrate (LOPRESSOR) 25 MG tablet Take 0.5 tablets (12.5 mg total) by mouth 2 (two) times daily. 07/07/21   [name], MD  NOVOLOG FLEXPEN 100 UNIT/ML FlexPen INJECT 5-20 UNITS INTO THE SKIN 3 (THREE) TIMES DAILY WITH MEALS. INJECT 15 UNITS IN THE MORNING, 20 UNITS AT LUNCH AND 5-10 UNITS WITH DINNER 06/28/21   [name], MD  nystatin (MYCOSTATIN/NYSTOP) powder APPLY 1 APPLICATION TO AFFECTED AREA TWICE A DAY 12/23/20   Leone Haven, MD  OnabotulinumtoxinA (BOTOX IJ) Inject as directed every 3 (three) months. For migraine    [provider]  ondansetron (ZOFRAN ODT) 4 MG disintegrating tablet Take 1 tablet (4 mg total) by mouth every 8 (eight) hours as needed for nausea or vomiting. 07/19/20   Leim Fabry, MD  Rimegepant Sulfate (NURTEC PO) Take by mouth as needed.    [provider]  Sod Picosulfate-Mag Ox-Cit Acd (CLENPIQ) 10-3.5-12 MG-GM -GM/160ML SOLN 5pm night before procedure drink 1 bottle, 5 hours procedure drink 1 bottle 08/30/21   Tahiliani, Margretta Sidle B, MD  SYRINGE-NEEDLE, DISP, 3 ML (BD INTEGRA SYRINGE) 25G X 1" 3 ML MISC FOR USE WITH B12 INJECTIONS WEEKLY/MONTHLY 08/01/18   [name], MD     Allergies Amoxicillin, Avocado, Clarithromycin, Clarithromycin, Honey, Other, Sulfa antibiotics, Sulfa drugs cross reactors, Watermelon concentrate, Insulin detemir, Tape, and Victoza [liraglutide]  Family History  Problem Relation Age of Onset   Diabetes Mother    Hypertension Mother    Hypertension Father    Diabetes Father    Depression Other        strong hx of   Breast  cancer Paternal Grandmother 18   Breast cancer Maternal Grandmother    Cancer Neg Hx     Social History Social History   Tobacco Use   Smoking status: Former    Packs/day: 1.00    Years: 4.00    Pack years: 4.00    Types: Cigarettes    Quit date: 02/05/1996    Years since quitting: 25.6   Smokeless tobacco: Never  Vaping Use   Vaping Use: Never used  Substance Use Topics   Alcohol use: No   Drug use: No    Review of Systems Constitutional: No fever/chills Eyes: No visual changes. ENT: No sore throat. Cardiovascular: Denies chest pain. Respiratory: Denies shortness of breath. Gastrointestinal: No abdominal pain.  No nausea, no vomiting.  No diarrhea.  No constipation. Genitourinary: Positive for dysuria currently, frequency, and urgency.  Musculoskeletal: Negative for back pain. Skin: Negative for rash. Neurological: Negative for headaches, focal weakness or numbness. Psychiatric: Anxiety Endocrine: Diabetes, hyperlipidemia, hypertension. Allergic/Immunilogical: See extensive allergy list. ____________________________________________   PHYSICAL EXAM:  VITAL SIGNS: ED Triage Vitals  Enc Vitals Group     BP      Pulse      Resp      Temp      Temp src      SpO2      Weight      Height      Head Circumference      Peak Flow      Pain Score      Pain Loc      Pain Edu?      Excl. in [place]?    Constitutional: Alert and oriented. Well appearing and in no acute distress. Cardiovascular: Normal rate, regular rhythm. Grossly normal heart sounds.  Good peripheral  circulation. Respiratory: Normal respiratory effort.  No retractions. Lungs CTAB. Gastrointestinal: Soft and nontender. No distention. No abdominal bruits. No CVA tenderness. Genitourinary: Deferred Neurologic:  Normal speech and language. No gross focal neurologic deficits are appreciated. No gait instability. Skin:  Skin is warm, dry and intact. No rash noted. Psychiatric: Mood and affect are normal. Speech and behavior are normal.  ____________________________________________   LABS (all labs ordered are listed, but only abnormal results are displayed)  Labs Reviewed  URINALYSIS, COMPLETE (UACMP) WITH MICROSCOPIC - Abnormal; Notable for the following components:      Result Value   APPearance HAZY (*)    Leukocytes,Ua TRACE (*)    Bacteria, UA RARE (*)    All other components within normal limits  URINE CULTURE   ____________________________________________  EKG   ____________________________________________  RADIOLOGY I, Sable Feil, personally viewed and evaluated these images (plain radiographs) as part of my medical decision making, as well as reviewing the written report by the radiologist.  ED MD interpretation:    Official radiology report(s): No results found.  ____________________________________________   PROCEDURES  Procedure(s) performed (including Critical Care):  Procedures   ____________________________________________   INITIAL IMPRESSION / ASSESSMENT AND PLAN / ED COURSE  As part of my medical decision making, I reviewed the following data within the [place]         Patient presents with dysuria, frequency, urgency for 4 to 5 days.  Discussed lab results with patient.  Advised patient urine culture is pending.  Patient given discharge care instructions and prescription for doxycycline and Pyridium.  Asked to follow-up with PCP.      ____________________________________________   FINAL CLINICAL IMPRESSION(S) /  ED DIAGNOSES  Final diagnoses:  Lower urinary tract infectious disease     ED Discharge Orders          Ordered    doxycycline (MONODOX) 100 MG capsule  2 times daily        09/09/21 0946    phenazopyridine (PYRIDIUM) 200 MG tablet  3 times daily PRN        09/09/21 0946             Note:  This document was prepared using Dragon voice recognition software and may include unintentional dictation errors.    Sable Feil, PA-C 09/09/21 4784    Delman Kitten, MD 09/21/21 [phone]

## 2021-09-09 NOTE — ED Notes (Signed)
See triage note  presents with urinary freq and pain  states she developed sxs' about 3-4 days ago  no fever  hx of UTI's in past   pt is also a diabetic

## 2021-09-09 NOTE — ED Triage Notes (Signed)
Pt c/o painful urination with increased frequency and urgency for the past 4-5 days, states she has a hx of UTI

## 2021-09-11 LAB — URINE CULTURE: Culture: 30000 — AB

## 2021-09-12 ENCOUNTER — Telehealth: Payer: Self-pay

## 2021-09-12 NOTE — Chronic Care Management (AMB) (Signed)
  Care Management   Note  09/12/2021 Name: [name] MRN: [id number] DOB: 1971-01-04  Maria Bentley is a 50 y.o. year old female who is a primary care patient of [name], Aris Everts, MD and is actively engaged with the care management team. I reached out to Dorinda Hill by phone today to assist with re-scheduling an initial visit with the Pharmacist  Follow up plan: Unsuccessful telephone outreach attempt made. A HIPAA compliant phone message was left for the patient providing contact information and requesting a return call.  The care management team will reach out to the patient again over the next 7 days.  If patient returns call to provider office, please advise to call [facility]  at [address], [address], [place], [place] [address] Direct Dial: [phone] [email or web address] [email or web address]

## 2021-09-13 ENCOUNTER — Encounter: Payer: Self-pay | Admitting: General Surgery

## 2021-09-15 ENCOUNTER — Encounter
Admission: RE | Disposition: A | Discharge status: Home / Self Care | Payer: Self-pay | Source: Home / Self Care | Attending: Gastroenterology

## 2021-09-15 ENCOUNTER — Ambulatory Visit: Payer: BC Managed Care – PPO | Admitting: Anesthesiology

## 2021-09-15 ENCOUNTER — Encounter: Payer: Self-pay | Admitting: Gastroenterology

## 2021-09-15 ENCOUNTER — Ambulatory Visit
Admission: RE | Admit: 2021-09-15 | Discharge: 2021-09-15 | Disposition: A | Discharge status: Home / Self Care | Payer: BC Managed Care – PPO | Attending: Gastroenterology | Admitting: Gastroenterology

## 2021-09-15 DIAGNOSIS — Z7982 Long term (current) use of aspirin: Secondary | ICD-10-CM | POA: Diagnosis not present

## 2021-09-15 DIAGNOSIS — Z880 Allergy status to penicillin: Secondary | ICD-10-CM | POA: Insufficient documentation

## 2021-09-15 DIAGNOSIS — Z882 Allergy status to sulfonamides status: Secondary | ICD-10-CM | POA: Insufficient documentation

## 2021-09-15 DIAGNOSIS — Z1211 Encounter for screening for malignant neoplasm of colon: Secondary | ICD-10-CM | POA: Insufficient documentation

## 2021-09-15 DIAGNOSIS — Z888 Allergy status to other drugs, medicaments and biological substances status: Secondary | ICD-10-CM | POA: Diagnosis not present

## 2021-09-15 DIAGNOSIS — Z794 Long term (current) use of insulin: Secondary | ICD-10-CM | POA: Insufficient documentation

## 2021-09-15 DIAGNOSIS — K635 Polyp of colon: Secondary | ICD-10-CM

## 2021-09-15 DIAGNOSIS — Z87891 Personal history of nicotine dependence: Secondary | ICD-10-CM | POA: Diagnosis not present

## 2021-09-15 DIAGNOSIS — Z79899 Other long term (current) drug therapy: Secondary | ICD-10-CM | POA: Insufficient documentation

## 2021-09-15 DIAGNOSIS — Z8616 Personal history of COVID-19: Secondary | ICD-10-CM | POA: Insufficient documentation

## 2021-09-15 DIAGNOSIS — Z9884 Bariatric surgery status: Secondary | ICD-10-CM | POA: Diagnosis not present

## 2021-09-15 DIAGNOSIS — Z6837 Body mass index (BMI) 37.0-37.9, adult: Secondary | ICD-10-CM | POA: Insufficient documentation

## 2021-09-15 DIAGNOSIS — Z881 Allergy status to other antibiotic agents status: Secondary | ICD-10-CM | POA: Insufficient documentation

## 2021-09-15 DIAGNOSIS — Z79890 Hormone replacement therapy (postmenopausal): Secondary | ICD-10-CM | POA: Insufficient documentation

## 2021-09-15 HISTORY — PX: COLONOSCOPY WITH PROPOFOL: SHX5780

## 2021-09-15 LAB — GLUCOSE, CAPILLARY: Glucose-Capillary: 180 mg/dL — ABNORMAL HIGH (ref 70–99)

## 2021-09-15 SURGERY — COLONOSCOPY WITH PROPOFOL
Anesthesia: General

## 2021-09-15 MED ORDER — SODIUM CHLORIDE 0.9 % IV SOLN: INTRAVENOUS | Status: DC

## 2021-09-15 MED ORDER — PROPOFOL 500 MG/50ML IV EMUL
INTRAVENOUS | Status: DC | PRN
  Administered 2021-09-15: 140 ug/kg/min via INTRAVENOUS

## 2021-09-15 MED ORDER — PROPOFOL 10 MG/ML IV BOLUS
INTRAVENOUS | Status: DC | PRN
  Administered 2021-09-15: 70 mg via INTRAVENOUS

## 2021-09-15 MED ORDER — PHENYLEPHRINE HCL (PRESSORS) 10 MG/ML IV SOLN
INTRAVENOUS | Status: DC | PRN
  Administered 2021-09-15 (×2): 100 ug via INTRAVENOUS

## 2021-09-15 NOTE — Op Note (Signed)
[facility] Gastroenterology Patient Name: Maria Bentley Procedure Date: 09/15/2021 8:59 AM MRN: [id number] Account #: [id number] Date of Birth: May 29, 1971 Admit Type: Outpatient Age: 50 Room: [facility] ENDO ROOM 2 Gender: Female Note Status: Finalized Instrument Name: Jasper Riling [id number] Procedure:             Colonoscopy Indications:           Screening for colorectal malignant neoplasm Providers:             Milana Salay B. Bonna Gains MD, MD Referring MD:          Deborra Medina, MD (Referring MD) Medicines:             Monitored Anesthesia Care Complications:         No immediate complications. Procedure:             Pre-Anesthesia Assessment:                        - ASA Grade Assessment: II - A patient with mild                         systemic disease.                        - Prior to the procedure, a History and Physical was                         performed, and patient medications, allergies and                         sensitivities were reviewed. The patient's tolerance                         of previous anesthesia was reviewed.                        - The risks and benefits of the procedure and the                         sedation options and risks were discussed with the                         patient. All questions were answered and informed                         consent was obtained.                        - Patient identification and proposed procedure were                         verified prior to the procedure by the physician, the                         nurse, the anesthesiologist, the anesthetist and the                         technician. The procedure was verified in the  procedure room.                        After obtaining informed consent, the colonoscope was                         passed under direct vision. Throughout the procedure,                         the patient's blood pressure, pulse, and oxygen                          saturations were monitored continuously. The                         Colonoscope was introduced through the anus and                         advanced to the the cecum, identified by appendiceal                         orifice and ileocecal valve. The colonoscopy was                         performed with ease. The patient tolerated the                         procedure well. The quality of the bowel preparation                         was poor. Findings:      The perianal and digital rectal examinations were normal.      A 6 mm polyp was found in the transverse colon. The polyp was sessile.       The polyp was removed with a cold snare. Resection and retrieval were       complete.      Liquid semi-liquid stool was found in the entire colon, interfering with       visualization. Biopsies for histology were taken with a cold forceps       from the entire colon for evaluation of microscopic colitis. Due to       patient's history of diarrhea random biopsies were indicated for       microscopic colitis.      The exam was otherwise without abnormality.      The retroflexed view of the distal rectum and anal verge was normal and       showed no anal or rectal abnormalities. Impression:            - Preparation of the colon was poor.                        - One 6 mm polyp in the transverse colon, removed with                         a cold snare. Resected and retrieved.                        - Stool in the entire examined colon. Biopsied.                        -  The examination was otherwise normal.                        - The distal rectum and anal verge are normal on                         retroflexion view. Recommendation:        - Await pathology results.                        - Discharge patient to home (with escort).                        - Advance diet as tolerated.                        - Continue present medications.                        - Repeat colonoscopy in  3-6 months, with 2 day prep                         because the bowel preparation was poor.                        - The findings and recommendations were discussed with                         the patient.                        - The findings and recommendations were discussed with                         the patient's family.                        - Return to primary care physician as previously                         scheduled. Procedure Code(s):     --- Professional ---                        [phone], Colonoscopy, flexible; with removal of                         tumor(s), polyp(s), or other lesion(s) by snare                         technique                        45380, 24, Colonoscopy, flexible; with biopsy, single                         or multiple Diagnosis Code(s):     --- Professional ---                        Z12.11, Encounter for screening for malignant neoplasm  of colon                        K63.5, Polyp of colon CPT copyright 2019 American Medical Association. All rights reserved. The codes documented in this report are preliminary and upon coder review may  be revised to meet current compliance requirements.  [name], MD Margretta Sidle B. Bonna Gains MD, MD 09/15/2021 9:35:54 AM This report has been signed electronically. Number of Addenda: 0 Note Initiated On: 09/15/2021 8:59 AM Scope Withdrawal Time: 0 hours 10 minutes 44 seconds  Total Procedure Duration: 0 hours 21 minutes 11 seconds  Estimated Blood Loss:  Estimated blood loss: none.      [facility]

## 2021-09-15 NOTE — Anesthesia Preprocedure Evaluation (Addendum)
Anesthesia Evaluation  Patient identified by MRN, date of birth, ID band Patient awake    Reviewed: Allergy & Precautions, NPO status , Patient's Chart, lab work & pertinent test results, reviewed documented beta blocker date and time   History of Anesthesia Complications Negative for: history of anesthetic complications  Airway Mallampati: III  TM Distance: >3 FB Neck ROM: Full    Dental  (+) Missing, Teeth Intact,    Pulmonary shortness of breath and with exertion, sleep apnea , neg COPD, neg recent URI, former smoker,    Pulmonary exam normal breath sounds clear to auscultation       Cardiovascular Exercise Tolerance: Poor hypertension, Pt. on medications and Pt. on home beta blockers (-) angina+ CAD  (-) Past MI and (-) Cardiac Stents Normal cardiovascular exam+ dysrhythmias (-) Valvular Problems/Murmurs Rhythm:Regular Rate:Normal  TTE 2020: 1. Left ventricular ejection fraction, by visual estimation, is 60 to  65%. The left ventricle has normal function. There is no left ventricular  hypertrophy.  2. Left ventricular diastolic parameters are consistent with Grade I  diastolic dysfunction (impaired relaxation).  3. Global right ventricle has mildly reduced systolic function.The right  ventricular size is normal.  4. Left atrial size was normal.  5. Right atrial size was normal.  6. The mitral valve is normal in structure. No evidence of mitral valve  regurgitation. No evidence of mitral stenosis.  7. The tricuspid valve is normal in structure. Tricuspid valve  regurgitation is trivial.  8. The aortic valve is tricuspid. Aortic valve regurgitation is not  visualized. No evidence of aortic valve sclerosis or stenosis.  9. The pulmonic valve was grossly normal. Pulmonic valve regurgitation is  trivial.  10. Normal LV systolic function; grade 1 diastolic dysfunction; mild RV  dysfunction.    Neuro/Psych   Headaches, neg Seizures PSYCHIATRIC DISORDERS Anxiety Depression  Neuromuscular disease    GI/Hepatic Neg liver ROS, s/p gastric bypass   Endo/Other  diabetes, Poorly Controlled, Type 2, Insulin Dependent, Oral Hypoglycemic AgentsHypothyroidism Morbid obesity  Renal/GU CRFRenal disease  negative genitourinary   Musculoskeletal  (+) Arthritis , Fibromyalgia -  Abdominal (+) + obese,   Peds negative pediatric ROS (+)  Hematology negative hematology ROS (+)   Anesthesia Other Findings .Marland KitchenPast Medical History: 04/30/2019: Abscess of right buttock No date: Anemia No date: Anxiety No date: COVID-19 virus infection 2010: Depression No date: Diabetes mellitus 10/13/2019: DKA, type 2 ([place]) No date: Endometriosis No date: Headache, common migraine, intractable No date: History of kidney stones No date: History of ovarian cyst     Comment:  left sided, caused by her endometriosis (possibly an               endometrioma), in her 41s. No date: Hyperlipidemia No date: Hypertension No date: Kidney stones     Comment:  left No date: Morbid obesity ([place])     Comment:  s/p gastric bypass 2010: Personal hx of gastric bypass No date: Rosacea No date: Vertigo   Reproductive/Obstetrics negative OB ROS                          Anesthesia Physical  Anesthesia Plan  ASA: III  Anesthesia Plan: General   Post-op Pain Management:    Induction: Intravenous  PONV Risk Score and Plan: 3 and TIVA and Treatment may vary due to age or medical condition  Airway Management Planned: Natural Airway and Nasal Cannula  Additional Equipment: None  Intra-op Plan:  Post-operative Plan:   Informed Consent: I have reviewed the patients History and Physical, chart, labs and discussed the procedure including the risks, benefits and alternatives for the proposed anesthesia with the patient or authorized representative who has indicated his/her understanding and acceptance.      Dental advisory given  Plan Discussed with: CRNA, Anesthesiologist and Surgeon  Anesthesia Plan Comments: (.)      Anesthesia Quick Evaluation

## 2021-09-15 NOTE — Anesthesia Postprocedure Evaluation (Signed)
Anesthesia Post Note  Patient: Maria Bentley  Procedure(s) Performed: COLONOSCOPY WITH PROPOFOL  Patient location during evaluation: Endoscopy Anesthesia Type: General Level of consciousness: awake and alert Pain management: pain level controlled Vital Signs Assessment: post-procedure vital signs reviewed and stable Respiratory status: spontaneous breathing, nonlabored ventilation and respiratory function stable Cardiovascular status: blood pressure returned to baseline and stable Postop Assessment: no apparent nausea or vomiting Anesthetic complications: no   No notable events documented.   Last Vitals:  Vitals:   09/15/21 0946 09/15/21 0956  BP: 133/90 139/90  Pulse: 87 81  Resp:    Temp:    SpO2: 99% 100%    Last Pain:  Vitals:   09/15/21 0956  TempSrc:   PainSc: 0-No pain                 [place] Ouch

## 2021-09-15 NOTE — Transfer of Care (Signed)
Immediate Anesthesia Transfer of Care Note  Patient: Maria Bentley  Procedure(s) Performed: COLONOSCOPY WITH PROPOFOL  Patient Location: PACU  Anesthesia Type:General  Level of Consciousness: awake, alert  and oriented  Airway & Oxygen Therapy: Patient Spontanous Breathing  Post-op Assessment: Report given to RN and Post -op Vital signs reviewed and stable  Post vital signs: Reviewed and stable  Last Vitals:  Vitals Value Taken Time  BP 92/57 09/15/21 0936  Temp 35.9 C 09/15/21 0936  Pulse 91 09/15/21 0937  Resp 14 09/15/21 0937  SpO2 99 % 09/15/21 0937  Vitals shown include unvalidated device data.  Last Pain:  Vitals:   09/15/21 0936  TempSrc: Temporal  PainSc: Asleep         Complications: No notable events documented.

## 2021-09-15 NOTE — H&P (Signed)
[name], MD [address] [place], [place], [place], [address] [address], [place], [place], [place], [address] Phone: [phone]  Fax: [phone]  Primary Care Physician:  [name], MD   Pre-Procedure History & Physical: HPI:  Maria Bentley is a 50 y.o. female is here for a colonoscopy.   Past Medical History:  Diagnosis Date   Abscess of right buttock 04/30/2019   Anemia    Anxiety    COVID-19 virus infection    Depression 2010   Diabetes mellitus    DKA, type 2 ([place]) 10/13/2019   Dysrhythmia    Endometriosis    Headache, common migraine, intractable    History of kidney stones    History of ovarian cyst    left sided, caused by her endometriosis (possibly an endometrioma), in her 41s.   Hyperlipidemia    Hypertension    Kidney stones    left   Morbid obesity ([place])    s/p gastric bypass   Personal hx of gastric bypass 2010   Rosacea    Vertigo     Past Surgical History:  Procedure Laterality Date   CARPAL TUNNEL RELEASE Right 11/26/2020   Procedure: CARPAL TUNNEL RELEASE;  Surgeon: Hessie Knows, MD;  Location: [facility] ORS;  Service: Orthopedics;  Laterality: Right;   COLONOSCOPY WITH PROPOFOL N/A 10/21/2020   Procedure: COLONOSCOPY WITH PROPOFOL;  Surgeon: Virgel Manifold, MD;  Location: [facility] END[facility];  Service: Endoscopy;  Laterality: N/A;   DILATION AND CURETTAGE OF UTERUS  2011   gastric bypass     OPEN REDUCTION INTERNAL FIXATION (ORIF) DISTAL RADIAL FRACTURE Right 11/26/2020   Procedure: Open reduction internal fixation, right distal radius;  Surgeon: Hessie Knows, MD;  Location: [facility] OR[facility];  Service: Orthopedics;  Laterality: Right;   OVARIAN CYST REMOVAL     ROTATOR CUFF REPAIR     right   SHOULDER ARTHROSCOPY WITH SUBACROMIAL DECOMPRESSION AND OPEN ROTATOR C Right 07/19/2020   Procedure: Right shoulder arthroscopic vs mini-open rotator cuff repair vs Regeneten patch application, subacomial dcompression, and biceps  tenodesis;  Surgeon: Leim Fabry, MD;  Location: [facility] OR[facility];  Service: Orthopedics;  Laterality: Right;    Prior to Admission medications   Medication Sig Start Date End Date Taking? Authorizing Provider  amitriptyline (ELAVIL) 25 MG tablet TAKE 1 TABLET BY MOUTH EVERYDAY AT BEDTIME 08/08/21  Yes [name], MD  aspirin-acetaminophen-caffeine (EXCEDRIN MIGRAINE) [phone] MG tablet Take 2 tablets by mouth every 6 (six) hours as needed for headache.   Yes [provider]  atorvastatin (LIPITOR) 20 MG tablet TAKE 1 TABLET BY MOUTH EVERY DAY 06/28/21  Yes [name], MD  buPROPion (WELLBUTRIN XL) 150 MG 24 hr tablet Take 450 mg by mouth daily.  05/12/18  Yes [provider]  CREON 36000-114000 units CPEP capsule TAKE 2 CAPSULES BY MOUTH 3 (THREE) TIMES DAILY BEFORE MEALS AND 1 CAPSULE WITH SNACKS 08/08/21  Yes Noeh Sparacino, Margretta Sidle B, MD  cyanocobalamin (,VITAMIN B-12,) 1000 MCG/ML injection Inject 1 mL (1,000 mcg total) into the muscle once a week. For 4 weeks, then monthly thereafter 07/13/21  Yes [name], MD  cyclobenzaprine (FLEXERIL) 10 MG tablet Take 0.5-1 tablets (5-10 mg total) by mouth 3 (three) times daily as needed for muscle spasms. 07/07/21  Yes [name], MD  doxycycline (MONODOX) 100 MG capsule Take 1 capsule (100 mg total) by mouth 2 (two) times daily. 09/09/21  Yes Sable Feil, PA-C  escitalopram (LEXAPRO) 20 MG tablet  Take 20 mg by mouth daily.  02/27/17  Yes [provider]  fluticasone (FLONASE) 50 MCG/ACT nasal spray SPRAY 2 SPRAYS INTO EACH NOSTRIL EVERY DAY 04/21/21  Yes [name], MD  furosemide (LASIX) 20 MG tablet TAKE 1 TABLET (20 MG TOTAL) BY MOUTH DAILY. AS NEEDED FOR FLUID RETENTION 02/01/21  Yes [name], MD  Insulin Glargine [facility]) 100 UNIT/ML INJECT 33 UNITS INTO THE SKIN AT BEDTIME. 08/08/21  Yes [name], MD  lamoTRIgine (LAMICTAL) 150 MG tablet Take 150 mg by mouth daily.   Yes [provider]  levothyroxine (SYNTHROID) 50 MCG tablet TAKE 1 TABLET BY MOUTH EVERY DAY 01/13/21  Yes [name], MD  losartan (COZAAR) 25 MG tablet Take 1 tablet by mouth daily. 09/28/20 09/28/21 Yes [provider]  meclizine (ANTIVERT) 25 MG tablet Take 1 tablet (25 mg total) by mouth 3 (three) times daily as needed for dizziness. 06/16/15  Yes [name], MD  Melatonin 10 MG TABS Take 10 mg by mouth at bedtime as needed (sleep).    Yes [provider]  NOVOLOG FLEXPEN 100 UNIT/ML FlexPen INJECT 5-20 UNITS INTO THE SKIN 3 (THREE) TIMES DAILY WITH MEALS. INJECT 15 UNITS IN THE MORNING, 20 UNITS AT LUNCH AND 5-10 UNITS WITH DINNER 06/28/21  Yes [name], MD  Rimegepant Sulfate (NURTEC PO) Take by mouth as needed.   Yes [provider]  Sod Picosulfate-Mag Ox-Cit Acd (CLENPIQ) 10-3.5-12 MG-GM -GM/160ML SOLN 5pm night before procedure drink 1 bottle, 5 hours procedure drink 1 bottle 08/30/21  Yes Lekha Dancer B, MD  albuterol (VENTOLIN HFA) 108 (90 Base) MCG/ACT inhaler Inhale 2 puffs into the lungs every 6 (six) hours as needed for wheezing or shortness of breath. 10/02/19   Terald Sleeper, PA-C  CLINPRO 5000 1.1 % PSTE Place 1 application onto teeth at bedtime.  06/15/20   [provider]  Continuous Blood Gluc Receiver (FREESTYLE LIBRE 14 DAY READER) DEVI 1 applicator by Does not apply route 4 (four) times daily. 11/24/17   [name], MD  Continuous Blood Gluc Sensor (FREESTYLE LIBRE 14 DAY SENSOR) MISC USE TO CHECK BLOOD SUGAR FOUR TIMES A DAY AS NEEDED 09/28/20   [name], MD  diphenhydrAMINE (BENADRYL) 25 MG tablet Take 25 mg by mouth every 6 (six) hours as needed for allergies.    [provider]  EPINEPHrine 0.3 mg/0.3 mL IJ SOAJ injection Inject 0.3 mg into the muscle as needed for anaphylaxis.  06/25/10   [provider]  HORIZANT 600 MG TBCR Take 1 tablet (600 mg total) by mouth 2 (two) times daily. 07/07/21    [name], MD  Insulin Pen Needle (BD PEN NEEDLE NANO U/F) 32G X 4 MM MISC Please specify directions, refills and quantity 08/22/18   [name], MD  loperamide (IMODIUM) 2 MG capsule Take 2 capsules (4 mg total) by mouth as needed for diarrhea or loose stools. 10/25/19   Amin, Jeanella Flattery, MD  LORazepam (ATIVAN) 0.5 MG tablet Take 0.5-1 mg by mouth daily as needed for anxiety.     [provider]  metoprolol tartrate (LOPRESSOR) 25 MG tablet Take 0.5 tablets (12.5 mg total) by mouth 2 (two) times daily. 07/07/21   [name], MD  nystatin (MYCOSTATIN/NYSTOP) powder APPLY 1 APPLICATION TO AFFECTED AREA TWICE A DAY 12/23/20   Leone Haven, MD  OnabotulinumtoxinA (BOTOX IJ) Inject as directed every 3 (three) months.  For migraine    [provider]  ondansetron (ZOFRAN ODT) 4 MG disintegrating tablet Take 1 tablet (4 mg total) by mouth every 8 (eight) hours as needed for nausea or vomiting. 07/19/20   Leim Fabry, MD  phenazopyridine (PYRIDIUM) 200 MG tablet Take 1 tablet (200 mg total) by mouth 3 (three) times daily as needed for pain. 09/09/21   Sable Feil, PA-C  SYRINGE-NEEDLE, DISP, 3 ML (BD INTEGRA SYRINGE) 25G X 1" 3 ML MISC FOR USE WITH B12 INJECTIONS WEEKLY/MONTHLY 08/01/18   [name], MD    Allergies as of 08/31/2021 - Review Complete 08/30/2021  Allergen Reaction Noted   Amoxicillin Hives, Rash, Itching, and Swelling 02/05/2012   Avocado Swelling 05/29/2015   Clarithromycin Hives and Rash 02/05/2012   Clarithromycin Anaphylaxis, Hives, and Rash 02/05/2012   Honey Anaphylaxis 04/02/2012   Other Hives and Swelling 05/29/2015   Sulfa antibiotics Anaphylaxis 02/05/2012   Sulfa drugs cross reactors Anaphylaxis 02/05/2012   Watermelon concentrate Hives and Swelling 04/02/2012   Insulin detemir Rash 06/11/2018   Tape Itching and Rash 04/02/2012   Victoza [liraglutide] Nausea Only 06/11/2018    Family History  Problem Relation Age of Onset    Diabetes Mother    Hypertension Mother    Hypertension Father    Diabetes Father    Depression Other        strong hx of   Breast cancer Paternal Grandmother 66   Breast cancer Maternal Grandmother    Cancer Neg Hx     Social History   Socioeconomic History   Marital status: Married    Spouse name: Not on file   Number of children: Not on file   Years of education: Not on file   Highest education level: Not on file  Occupational History   Not on file  Tobacco Use   Smoking status: Former    Packs/day: 1.00    Years: 4.00    Pack years: 4.00    Types: Cigarettes    Quit date: 02/05/1996    Years since quitting: 25.6   Smokeless tobacco: Never  Vaping Use   Vaping Use: Never used  Substance and Sexual Activity   Alcohol use: No   Drug use: No   Sexual activity: Yes    Birth control/protection: None  Other Topics Concern   Not on file  Social History Narrative   Not on file   Social Determinants of Health   Financial Resource Strain: Not on file  Food Insecurity: Not on file  Transportation Needs: Not on file  Physical Activity: Not on file  Stress: Not on file  Social Connections: Not on file  Intimate Partner Violence: Not on file    Review of Systems: See HPI, otherwise negative ROS  Physical Exam: Constitutional: General:   Alert,  Well-developed, well-nourished, pleasant and cooperative in NAD BP (!) 143/98   Pulse (!) 110   Temp 97.6 F (36.4 C) (Temporal)   Resp 18   Ht 5\' 4"  (1.626 m)   Wt 100.2 kg   LMP 05/09/2016 (LMP Unknown) Comment: D/C  SpO2 95%   BMI 37.92 kg/m   Head: Normocephalic, atraumatic.   Eyes:  Sclera clear, no icterus.   Conjunctiva pink.   Mouth:  No deformity or lesions, oropharynx pink & moist.  Neck:  Supple, trachea midline  Respiratory: Normal respiratory effort  Gastrointestinal:  Soft, non-tender and non-distended without masses, hepatosplenomegaly or hernias noted.  No guarding or rebound tenderness.  Cardiac: No clubbing or edema.  No cyanosis. Normal posterior tibial pedal pulses noted.  Lymphatic:  No significant cervical adenopathy.  Psych:  Alert and cooperative. Normal mood and affect.  Musculoskeletal:   Symmetrical without gross deformities. 5/5 Lower extremity strength bilaterally.  Skin: Warm. Intact without significant lesions or rashes. No jaundice.  Neurologic:  Face symmetrical, tongue midline, Normal sensation to touch;  grossly normal neurologically.  Psych:  Alert and oriented x3, Alert and cooperative. Normal mood and affect.  Impression/Plan: Maria Bentley is here for a colonoscopy to be performed for average risk screening.  Risks, benefits, limitations, and alternatives regarding  colonoscopy have been reviewed with the patient.  Questions have been answered.  All parties agreeable.   Virgel Manifold, MD  09/15/2021, 9:04 AM

## 2021-09-16 ENCOUNTER — Encounter: Payer: Self-pay | Admitting: Gastroenterology

## 2021-09-19 ENCOUNTER — Encounter: Payer: Self-pay | Admitting: Gastroenterology

## 2021-09-19 LAB — SURGICAL PATHOLOGY

## 2021-09-21 ENCOUNTER — Encounter: Payer: BC Managed Care – PPO | Admitting: Obstetrics and Gynecology

## 2021-09-22 NOTE — Chronic Care Management (AMB) (Signed)
  Care Management   Note  09/22/2021 Name: [name] MRN: [id number] DOB: [birth year]  Maria Bentley is a 50 y.o. year old female who is a primary care patient of [name], Aris Everts, MD and is actively engaged with the care management team. I reached out to Dorinda Hill by phone today to assist with re-scheduling an initial visit with the Pharmacist  Follow up plan: Telephone appointment with care management team member scheduled for:10/12/2021  Noreene Larsson, [place], [place], [place] [address] Direct Dial: [phone] [email or web address]

## 2021-09-30 ENCOUNTER — Other Ambulatory Visit: Payer: Self-pay | Admitting: Internal Medicine

## 2021-10-03 ENCOUNTER — Emergency Department
Admission: EM | Admit: 2021-10-03 | Discharge: 2021-10-03 | Disposition: A | Discharge status: Home / Self Care | Payer: BC Managed Care – PPO | Attending: Emergency Medicine | Admitting: Emergency Medicine

## 2021-10-03 ENCOUNTER — Ambulatory Visit: Payer: BC Managed Care – PPO

## 2021-10-03 ENCOUNTER — Other Ambulatory Visit: Payer: Self-pay

## 2021-10-03 ENCOUNTER — Telehealth: Payer: BC Managed Care – PPO | Admitting: Family Medicine

## 2021-10-03 ENCOUNTER — Encounter: Payer: Self-pay | Admitting: Family Medicine

## 2021-10-03 ENCOUNTER — Other Ambulatory Visit: Payer: Self-pay | Admitting: Internal Medicine

## 2021-10-03 DIAGNOSIS — Z87891 Personal history of nicotine dependence: Secondary | ICD-10-CM | POA: Insufficient documentation

## 2021-10-03 DIAGNOSIS — Z794 Long term (current) use of insulin: Secondary | ICD-10-CM | POA: Diagnosis not present

## 2021-10-03 DIAGNOSIS — Z7984 Long term (current) use of oral hypoglycemic drugs: Secondary | ICD-10-CM | POA: Insufficient documentation

## 2021-10-03 DIAGNOSIS — E1110 Type 2 diabetes mellitus with ketoacidosis without coma: Secondary | ICD-10-CM | POA: Insufficient documentation

## 2021-10-03 DIAGNOSIS — E1165 Type 2 diabetes mellitus with hyperglycemia: Secondary | ICD-10-CM | POA: Diagnosis not present

## 2021-10-03 DIAGNOSIS — N3000 Acute cystitis without hematuria: Secondary | ICD-10-CM | POA: Diagnosis not present

## 2021-10-03 DIAGNOSIS — Z8616 Personal history of COVID-19: Secondary | ICD-10-CM | POA: Diagnosis not present

## 2021-10-03 DIAGNOSIS — I129 Hypertensive chronic kidney disease with stage 1 through stage 4 chronic kidney disease, or unspecified chronic kidney disease: Secondary | ICD-10-CM | POA: Insufficient documentation

## 2021-10-03 DIAGNOSIS — Z79899 Other long term (current) drug therapy: Secondary | ICD-10-CM | POA: Diagnosis not present

## 2021-10-03 DIAGNOSIS — R112 Nausea with vomiting, unspecified: Secondary | ICD-10-CM

## 2021-10-03 DIAGNOSIS — R110 Nausea: Secondary | ICD-10-CM | POA: Insufficient documentation

## 2021-10-03 DIAGNOSIS — N1831 Chronic kidney disease, stage 3a: Secondary | ICD-10-CM | POA: Diagnosis not present

## 2021-10-03 DIAGNOSIS — D631 Anemia in chronic kidney disease: Secondary | ICD-10-CM | POA: Insufficient documentation

## 2021-10-03 DIAGNOSIS — R300 Dysuria: Secondary | ICD-10-CM | POA: Diagnosis present

## 2021-10-03 DIAGNOSIS — E039 Hypothyroidism, unspecified: Secondary | ICD-10-CM | POA: Insufficient documentation

## 2021-10-03 DIAGNOSIS — R109 Unspecified abdominal pain: Secondary | ICD-10-CM

## 2021-10-03 DIAGNOSIS — E1122 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 disease: Secondary | ICD-10-CM | POA: Diagnosis not present

## 2021-10-03 LAB — URINALYSIS, ROUTINE W REFLEX MICROSCOPIC
Bilirubin Urine: NEGATIVE
Glucose, UA: >=500 mg/dL — AB
Hgb urine dipstick: NEGATIVE
Ketones, ur: 5 mg/dL — AB
Nitrite: NEGATIVE
Protein, ur: 100 mg/dL — AB
Specific Gravity, Urine: 1.021 (ref 1.005–1.030)
WBC, UA: >50 WBC/hpf — ABNORMAL HIGH (ref 0–5)
pH: 5 (ref 5.0–8.0)

## 2021-10-03 LAB — COMPREHENSIVE METABOLIC PANEL
ALT: 14 U/L (ref 0–44)
AST: 14 U/L — ABNORMAL LOW (ref 15–41)
Albumin: 3.4 g/dL — ABNORMAL LOW (ref 3.5–5.0)
Alkaline Phosphatase: 115 U/L (ref 38–126)
Anion gap: 7 (ref 5–15)
BUN: 25 mg/dL — ABNORMAL HIGH (ref 6–20)
CO2: 26 mmol/L (ref 22–32)
Calcium: 9 mg/dL (ref 8.9–10.3)
Chloride: 100 mmol/L (ref 98–111)
Creatinine, Ser: 1.5 mg/dL — ABNORMAL HIGH (ref 0.44–1.00)
GFR, Estimated: 42 mL/min — ABNORMAL LOW (ref >60)
Glucose, Bld: 367 mg/dL — ABNORMAL HIGH (ref 70–99)
Potassium: 4 mmol/L (ref 3.5–5.1)
Sodium: 133 mmol/L — ABNORMAL LOW (ref 135–145)
Total Bilirubin: 0.7 mg/dL (ref 0.3–1.2)
Total Protein: 7.5 g/dL (ref 6.5–8.1)

## 2021-10-03 LAB — CBC WITH DIFFERENTIAL/PLATELET
Abs Immature Granulocytes: 0.02 10*3/uL (ref 0.00–0.07)
Basophils Absolute: 0.1 10*3/uL (ref 0.0–0.1)
Basophils Relative: 1 %
Eosinophils Absolute: 0.5 10*3/uL (ref 0.0–0.5)
Eosinophils Relative: 7 %
HCT: 38.8 % (ref 36.0–46.0)
Hemoglobin: 11.8 g/dL — ABNORMAL LOW (ref 12.0–15.0)
Immature Granulocytes: 0 %
Lymphocytes Relative: 28 %
Lymphs Abs: 2.2 10*3/uL (ref 0.7–4.0)
MCH: 26.2 pg (ref 26.0–34.0)
MCHC: 30.4 g/dL (ref 30.0–36.0)
MCV: 86.2 fL (ref 80.0–100.0)
Monocytes Absolute: 0.4 10*3/uL (ref 0.1–1.0)
Monocytes Relative: 5 %
Neutro Abs: 4.6 10*3/uL (ref 1.7–7.7)
Neutrophils Relative %: 59 %
Platelets: 527 10*3/uL — ABNORMAL HIGH (ref 150–400)
RBC: 4.5 MIL/uL (ref 3.87–5.11)
RDW: 15.1 % (ref 11.5–15.5)
WBC: 7.7 10*3/uL (ref 4.0–10.5)
nRBC: 0 % (ref 0.0–0.2)

## 2021-10-03 MED ORDER — ONDANSETRON 4 MG PO TBDP: 4.0000 mg | ORAL_TABLET | Freq: Three times a day (TID) | ORAL | 0 refills | Status: DC | PRN

## 2021-10-03 MED ORDER — CIPROFLOXACIN HCL 500 MG PO TABS: 500.0000 mg | ORAL_TABLET | Freq: Two times a day (BID) | ORAL | 0 refills | Status: AC

## 2021-10-03 NOTE — ED Provider Notes (Signed)
Emergency Medicine Provider Triage Evaluation Note  Maria Bentley , a 50 y.o. female  was evaluated in triage.  Pt complains of dysuria, right flank pain.  Patient had a diagnosed UTI, had been taking antibiotics but lost the last part of the antibiotics.  She was taken doxycycline for her UTI.  Patient states that symptoms had not fully resolved, they have worsened now and she is now experiencing right flank pain.  No emesis, diarrhea.  No fevers or chills.  She does have a history of recurrent UTIs.  Review of Systems  Positive: Dysuria, right flank pain Negative: Fevers, chills, nausea, vomiting, diarrhea, constipation  Physical Exam  BP 113/77 (BP Location: Left Arm)   Pulse 88   Temp 98.2 F (36.8 C) (Oral)   Resp 18   Ht 5\' 4"  (1.626 m)   Wt 102.1 kg   LMP 05/09/2016 (LMP Unknown) Comment: D/C  SpO2 92%   BMI 38.62 kg/m  Gen:   Awake, no distress   Resp:  Normal effort  MSK:   Moves extremities without difficulty  Other:  Bowel sounds appreciated.  No abdominal tenderness.  Right-sided CVA tenderness  Medical Decision Making  Medically screening exam initiated at 6:02 PM.  Appropriate orders placed.  Chong January was informed that the remainder of the evaluation will be completed by another provider, this initial triage assessment does not replace that evaluation, and the importance of remaining in the ED until their evaluation is complete.  Patient will have CBC, CMP, urine.  Patient has been diagnosed with a UTI, lost the second half of her antibiotics.  Symptoms did not fully alleviate, have returned and have worsened.  Patient is now experiencing right flank pain.  At this time will order urinalysis, CBC, CMP, urine culture.  Suspect pyelonephritis.  Vital signs are reassuring at this time.   Darletta Moll, PA-C 10/03/21 1803    Harvest Dark, MD 10/03/21 2255

## 2021-10-03 NOTE — ED Provider Notes (Signed)
[facility] Emergency Department Provider Note   ____________________________________________   Event Date/Time   First MD Initiated Contact with Patient 10/03/21 1906     (approximate)  I have reviewed the triage vital signs and the nursing notes.   HISTORY  Chief Complaint Dysuria    HPI Maria Bentley is a 50 y.o. female with past medical history of hypertension, hyperlipidemia, diabetes who presents to the ED complaining of dysuria.  Patient reports that she has had 3 days of urinary frequency, burning when she urinates, and urinary urgency.  She states she has been feeling nauseous at times, but has not vomited.  She has not had any fevers, flank pain, vaginal bleeding, discharge, or abdominal pain.  She states current symptoms are similar to prior UTIs.  She was diagnosed with a UTI at the end of September, reports she was prescribed doxycycline at that time but did not complete the full course of antibiotics as she lost the bottle of pills.        Past Medical History:  Diagnosis Date   Abscess of right buttock 04/30/2019   Anemia    Anxiety    COVID-19 virus infection    Depression 2010   Diabetes mellitus    DKA, type 2 ([place]) 10/13/2019   Dysrhythmia    Endometriosis    Headache, common migraine, intractable    History of kidney stones    History of ovarian cyst    left sided, caused by her endometriosis (possibly an endometrioma), in her 13s.   Hyperlipidemia    Hypertension    Kidney stones    left   Morbid obesity ([place])    s/p gastric bypass   Personal hx of gastric bypass 2010   Rosacea    Vertigo     Patient Active Problem List   Diagnosis Date Noted   Polyp of transverse colon    COVID-19 long hauler manifesting chronic concentration deficit 07/09/2021   Encounter for screening colonoscopy    Diarrhea 09/04/2020   Anemia in chronic kidney disease 08/25/2020   Hematuria 08/25/2020   Stage 3a chronic kidney disease  ([place]) 08/25/2020   Analgesic overuse headache 08/24/2020   Atypical facial pain 08/24/2020   Bruxism (teeth grinding) 08/24/2020   Cervical syndrome 08/24/2020   Disorder of autonomic nervous system 08/24/2020   Elevated blood-pressure reading without diagnosis of hypertension 08/24/2020   Fibromyositis 08/24/2020   Temporomandibular joint disorder 08/24/2020   Hypoglycemic reaction 08/21/2020   Coronary atherosclerosis due to calcified coronary lesion 08/21/2020   Renal mass, left 07/06/2020   Chronic right shoulder pain 02/09/2020   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 disease ([place]) 01/26/2020   Recurrent headache 01/13/2020   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 12/30/2019   Elevated alkaline phosphatase level 11/21/2019   Hospital discharge follow-up 11/04/2019   Cervical spondylosis without myelopathy 10/29/2019   Pressure injury of skin 10/16/2019   History of gastric bypass 10/13/2019   COVID-19 virus infection 10/06/2019   Preoperative evaluation of a medical condition to rule out surgical contraindications (TAR required) 09/21/2019   Morbid obesity with BMI of 40.0-44.9, adult ([place]) 06/24/2019   History of bariatric surgery 04/15/2019   Cervicocranial syndrome 11/12/2018   Depressive disorder 11/12/2018   Fibromyalgia 06/05/2018   Iron deficiency anemia 06/05/2018   Enteritis, enteropathogenic E. 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   Morbid obesity ([place]) 06/09/2012   Anxiety    Headache, common migraine, intractable    Hyperlipidemia    Hypothyroid    OSA  (obstructive sleep apnea) 02/05/2012    Past Surgical History:  Procedure Laterality Date   CARPAL TUNNEL RELEASE Right 11/26/2020   Procedure: CARPAL TUNNEL RELEASE;  Surgeon: Hessie Knows, MD;  Location: [facility] ORS;  Service: Orthopedics;  Laterality: Right;   COLONOSCOPY WITH PROPOFOL N/A 10/21/2020   Procedure: COLONOSCOPY WITH PROPOFOL;  Surgeon: Virgel Manifold, MD;  Location: [facility] ENDOSCOPY;  Service: Endoscopy;  Laterality: N/A;   COLONOSCOPY WITH PROPOFOL N/A 09/15/2021   Procedure: COLONOSCOPY WITH PROPOFOL;  Surgeon: Virgel Manifold, MD;  Location: [facility] ENDOSCOPY;  Service: Endoscopy;  Laterality: N/A;   DILATION AND CURETTAGE OF UTERUS  2011   gastric bypass     OPEN REDUCTION INTERNAL FIXATION (ORIF) DISTAL RADIAL FRACTURE Right 11/26/2020   Procedure: Open reduction internal fixation, right distal radius;  Surgeon: Hessie Knows, MD;  Location: [facility] ORS;  Service: Orthopedics;  Laterality: Right;   OVARIAN CYST REMOVAL     ROTATOR CUFF REPAIR     right   SHOULDER ARTHROSCOPY WITH SUBACROMIAL DECOMPRESSION AND OPEN ROTATOR C Right 07/19/2020   Procedure: Right shoulder arthroscopic vs mini-open rotator cuff repair vs Regeneten patch application, subacomial dcompression, and biceps tenodesis;  Surgeon: Leim Fabry, MD;  Location: [facility] ORS;  Service: Orthopedics;  Laterality: Right;    Prior to Admission medications   Medication Sig Start Date End Date Taking? Authorizing Provider  ciprofloxacin (CIPRO) 500 MG tablet Take 1 tablet (500 mg total) by mouth 2 (two) times daily for 7 days. 10/03/21 10/10/21 Yes Blake Divine, MD  ondansetron (ZOFRAN ODT) 4 MG disintegrating tablet Take 1 tablet (4 mg total) by mouth every 8 (eight) hours as needed for nausea or vomiting. 10/03/21  Yes Blake Divine, MD  albuterol (VENTOLIN HFA) 108 (90 Base) MCG/ACT inhaler Inhale 2 puffs into the lungs every 6 (six) hours as needed for wheezing or shortness of breath. 10/02/19   Terald Sleeper, PA-C  amitriptyline (ELAVIL) 25 MG tablet TAKE 1 TABLET BY MOUTH EVERYDAY AT BEDTIME 08/08/21   [name], MD  aspirin-acetaminophen-caffeine (EXCEDRIN MIGRAINE) [phone] MG tablet Take 2 tablets by mouth every 6 (six) hours as needed for headache.    [provider]  atorvastatin (LIPITOR) 20 MG tablet TAKE 1 TABLET BY MOUTH EVERY DAY 10/03/21   [name], MD  buPROPion (WELLBUTRIN XL) 150 MG 24 hr tablet Take 450 mg by mouth daily.  05/12/18   [provider]  CLINPRO 5000 1.1 % PSTE Place 1 application onto teeth at bedtime.  06/15/20   [provider]  Continuous Blood Gluc Receiver (FREESTYLE LIBRE 14 DAY READER) DEVI 1 applicator by Does not apply route 4 (four) times daily. 11/24/17   [name], MD  Continuous Blood Gluc Sensor (FREESTYLE LIBRE 14 DAY SENSOR) MISC USE TO CHECK BLOOD SUGAR FOUR TIMES A DAY AS NEEDED 09/28/20   [name], MD  CREON [phone] units CPEP capsule TAKE 2 CAPSULES BY MOUTH 3 (THREE) TIMES DAILY BEFORE MEALS AND 1 CAPSULE WITH SNACKS 08/08/21   Tahiliani,  Lennette Bihari, MD  cyanocobalamin (,VITAMIN B-12,) 1000 MCG/ML injection Inject 1 mL (1,000 mcg total) into the muscle once a week. For 4 weeks, then monthly thereafter 07/13/21   [name], MD  cyclobenzaprine (FLEXERIL) 10 MG tablet Take 0.5-1 tablets (5-10 mg total) by mouth 3 (three) times daily as needed for muscle spasms. 07/07/21   [name], MD  diphenhydrAMINE (BENADRYL) 25 MG tablet Take 25 mg by mouth every 6 (six) hours as needed for allergies.    [provider]  doxycycline (MONODOX) 100 MG capsule Take 1 capsule (100 mg total) by mouth 2 (two) times daily. 09/09/21   Sable Feil, PA-C  EPINEPHrine 0.3 mg/0.3 mL IJ SOAJ injection Inject 0.3 mg into the muscle as needed for anaphylaxis.  06/25/10   [provider]  escitalopram (LEXAPRO) 20 MG tablet Take 20 mg by mouth daily.  02/27/17   [provider]   fluticasone (FLONASE) 50 MCG/ACT nasal spray SPRAY 2 SPRAYS INTO EACH NOSTRIL EVERY DAY 04/21/21   [name], MD  furosemide (LASIX) 20 MG tablet TAKE 1 TABLET (20 MG TOTAL) BY MOUTH DAILY. AS NEEDED FOR FLUID RETENTION 02/01/21   [name], MD  HORIZANT 600 MG TBCR Take 1 tablet (600 mg total) by mouth 2 (two) times daily. 07/07/21   [name], MD  Insulin Glargine [facility] KWIKPEN) 100 UNIT/ML INJECT 33 UNITS INTO THE SKIN AT BEDTIME. 08/08/21   [name], MD  Insulin Pen Needle (BD PEN NEEDLE NANO U/F) 32G X 4 MM MISC Please specify directions, refills and quantity 08/22/18   [name], MD  lamoTRIgine (LAMICTAL) 150 MG tablet Take 150 mg by mouth daily.    [provider]  levothyroxine (SYNTHROID) 50 MCG tablet TAKE 1 TABLET BY MOUTH EVERY DAY 01/13/21   [name], MD  loperamide (IMODIUM) 2 MG capsule Take 2 capsules (4 mg total) by mouth as needed for diarrhea or loose stools. 10/25/19   Amin, Jeanella Flattery, MD  LORazepam (ATIVAN) 0.5 MG tablet Take 0.5-1 mg by mouth daily as needed for anxiety.     [provider]  losartan (COZAAR) 25 MG tablet Take 1 tablet by mouth daily. 09/28/20 09/28/21  [provider]  meclizine (ANTIVERT) 25 MG tablet Take 1 tablet (25 mg total) by mouth 3 (three) times daily as needed for dizziness. 06/16/15   [name], MD  Melatonin 10 MG TABS Take 10 mg by mouth at bedtime as needed (sleep).     [provider]  metoprolol tartrate (LOPRESSOR) 25 MG tablet Take 0.5 tablets (12.5 mg total) by mouth 2 (two) times daily. 07/07/21   [name], MD  NOVOLOG FLEXPEN 100 UNIT/ML FlexPen INJECT 5-20 UNITS INTO THE SKIN 3 (THREE) TIMES DAILY WITH MEALS. INJECT 15 UNITS IN THE MORNING, 20 UNITS AT LUNCH AND 5-10 UNITS WITH DINNER 06/28/21   [name], MD  nystatin (MYCOSTATIN/NYSTOP) powder APPLY 1 APPLICATION TO AFFECTED AREA TWICE A DAY 12/23/20   Leone Haven, MD  OnabotulinumtoxinA  (BOTOX IJ) Inject as directed every 3 (three) months. For migraine    [provider]  phenazopyridine (PYRIDIUM) 200 MG tablet Take 1 tablet (200 mg total) by mouth 3 (three) times daily as needed for pain. 09/09/21   Sable Feil, PA-C  Rimegepant Sulfate (NURTEC PO) Take by mouth as needed.    [provider]  Sod Picosulfate-Mag Ox-Cit Acd (CLENPIQ) 10-3.5-12 MG-GM -GM/160ML  SOLN 5pm night before procedure drink 1 bottle, 5 hours procedure drink 1 bottle 08/30/21   Tahiliani, Margretta Sidle B, MD  SYRINGE-NEEDLE, DISP, 3 ML (BD INTEGRA SYRINGE) 25G X 1" 3 ML MISC FOR USE WITH B12 INJECTIONS WEEKLY/MONTHLY 08/01/18   [name], MD    Allergies Amoxicillin, Avocado, Clarithromycin, Clarithromycin, Honey, Other, Sulfa antibiotics, Sulfa drugs cross reactors, Watermelon concentrate, Insulin detemir, Tape, and Victoza [liraglutide]  Family History  Problem Relation Age of Onset   Diabetes Mother    Hypertension Mother    Hypertension Father    Diabetes Father    Depression Other        strong hx of   Breast cancer Paternal Grandmother 27   Breast cancer Maternal Grandmother    Cancer Neg Hx     Social History Social History   Tobacco Use   Smoking status: Former    Packs/day: 1.00    Years: 4.00    Pack years: 4.00    Types: Cigarettes    Quit date: 02/05/1996    Years since quitting: 25.6   Smokeless tobacco: Never  Vaping Use   Vaping Use: Never used  Substance Use Topics   Alcohol use: No   Drug use: No    Review of Systems  Constitutional: No fever/chills Eyes: No visual changes. ENT: No sore throat. Cardiovascular: Denies chest pain. Respiratory: Denies shortness of breath. Gastrointestinal: No abdominal pain.  Positive for nausea, no vomiting.  No diarrhea.  No constipation. Genitourinary: Positive for dysuria, frequency, and urgency. Musculoskeletal: Negative for back pain. Skin: Negative for rash. Neurological: Negative for headaches, focal  weakness or numbness.  ____________________________________________   PHYSICAL EXAM:  VITAL SIGNS: ED Triage Vitals  Enc Vitals Group     BP 10/03/21 1758 113/77     Pulse Rate 10/03/21 1758 88     Resp 10/03/21 1758 18     Temp 10/03/21 1758 98.2 F (36.8 C)     Temp Source 10/03/21 1758 Oral     SpO2 10/03/21 1758 92 %     Weight 10/03/21 1759 225 lb (102.1 kg)     Height 10/03/21 1759 5\' 4"  (1.626 m)     Head Circumference --      Peak Flow --      Pain Score 10/03/21 1758 7     Pain Loc --      Pain Edu? --      Excl. in [place]? --     Constitutional: Alert and oriented. Eyes: Conjunctivae are normal. Head: Atraumatic. Nose: No congestion/rhinnorhea. Mouth/Throat: Mucous membranes are moist. Neck: Normal ROM Cardiovascular: Normal rate, regular rhythm. Grossly normal heart sounds.  2+ radial pulses bilaterally. Respiratory: Normal respiratory effort.  No retractions. Lungs CTAB. Gastrointestinal: Soft and nontender.  No CVA tenderness bilaterally.  No distention. Genitourinary: deferred Musculoskeletal: No lower extremity tenderness nor edema. Neurologic:  Normal speech and language. No gross focal neurologic deficits are appreciated. Skin:  Skin is warm, dry and intact. No rash noted. Psychiatric: Mood and affect are normal. Speech and behavior are normal.  ____________________________________________   LABS (all labs ordered are listed, but only abnormal results are displayed)  Labs Reviewed  COMPREHENSIVE METABOLIC PANEL - Abnormal; Notable for the following components:      Result Value   Sodium 133 (*)    Glucose, Bld 367 (*)    BUN 25 (*)    Creatinine, Ser 1.50 (*)    Albumin 3.4 (*)    AST 14 (*)  GFR, Estimated 42 (*)    All other components within normal limits  CBC WITH DIFFERENTIAL/PLATELET - Abnormal; Notable for the following components:   Hemoglobin 11.8 (*)    Platelets 527 (*)    All other components within normal limits  URINALYSIS,  ROUTINE W REFLEX MICROSCOPIC - Abnormal; Notable for the following components:   Color, Urine AMBER (*)    APPearance CLOUDY (*)    Glucose, UA >=500 (*)    Ketones, ur 5 (*)    Protein, ur 100 (*)    Leukocytes,Ua MODERATE (*)    WBC, UA >50 (*)    Bacteria, UA MANY (*)    All other components within normal limits  URINE CULTURE    PROCEDURES  Procedure(s) performed (including Critical Care):  Procedures   ____________________________________________   INITIAL IMPRESSION / ASSESSMENT AND PLAN / ED COURSE      50 year old female with past medical history of hypertension, hyperlipidemia, and diabetes who presents to the ED complaining of 3 days of dysuria, frequency, and urgency associated with some nausea.  UA is consistent with UTI and prior culture grew out Klebsiella.  Labs remarkable for hyperglycemia but are otherwise reassuring, no evidence of DKA.  Given patient's allergies and sensitivities noted on prior urine culture, she has few options outside of ciprofloxacin.  We will prescribe a 7-day course of Cipro along with ODT Zofran.  Patient was counseled to take all antibiotics until done and to follow-up with her PCP, otherwise return to the ED for new worsening symptoms.  Patient agrees with plan.      ____________________________________________   FINAL CLINICAL IMPRESSION(S) / ED DIAGNOSES  Final diagnoses:  Acute cystitis without hematuria  Nausea     ED Discharge Orders          Ordered    ciprofloxacin (CIPRO) 500 MG tablet  2 times daily        10/03/21 1912    ondansetron (ZOFRAN ODT) 4 MG disintegrating tablet  Every 8 hours PRN        10/03/21 1912             Note:  This document was prepared using Dragon voice recognition software and may include unintentional dictation errors.    Blake Divine, MD 10/03/21 [phone]

## 2021-10-03 NOTE — ED Notes (Signed)
Pt evaluated by Dr Charna Archer in triage

## 2021-10-03 NOTE — Progress Notes (Signed)
[place]  Possible pyleo issue due to untreated/failed treatment of UTI  N/V, back and flank pain- unsure of fevers.   See note from 9/30 UTI doxy given- not completed

## 2021-10-03 NOTE — ED Triage Notes (Signed)
Pt c/o of dysuria and right flank pain that started on 09/30/2021. Pt was diagnosed with a UTI 2 weeks ago and lost the antibiotics and did not take the full course. Doxycyline was prescribed.

## 2021-10-04 IMAGING — DX DG CHEST 1V PORT
1 series · 1 of 1 positions shown · non-contrast
Comparison: May 08, 2011

CLINICAL DATA: Shortness of breath.  [id number] positive

EXAM:
PORTABLE CHEST 1 VIEW

[chest ap]
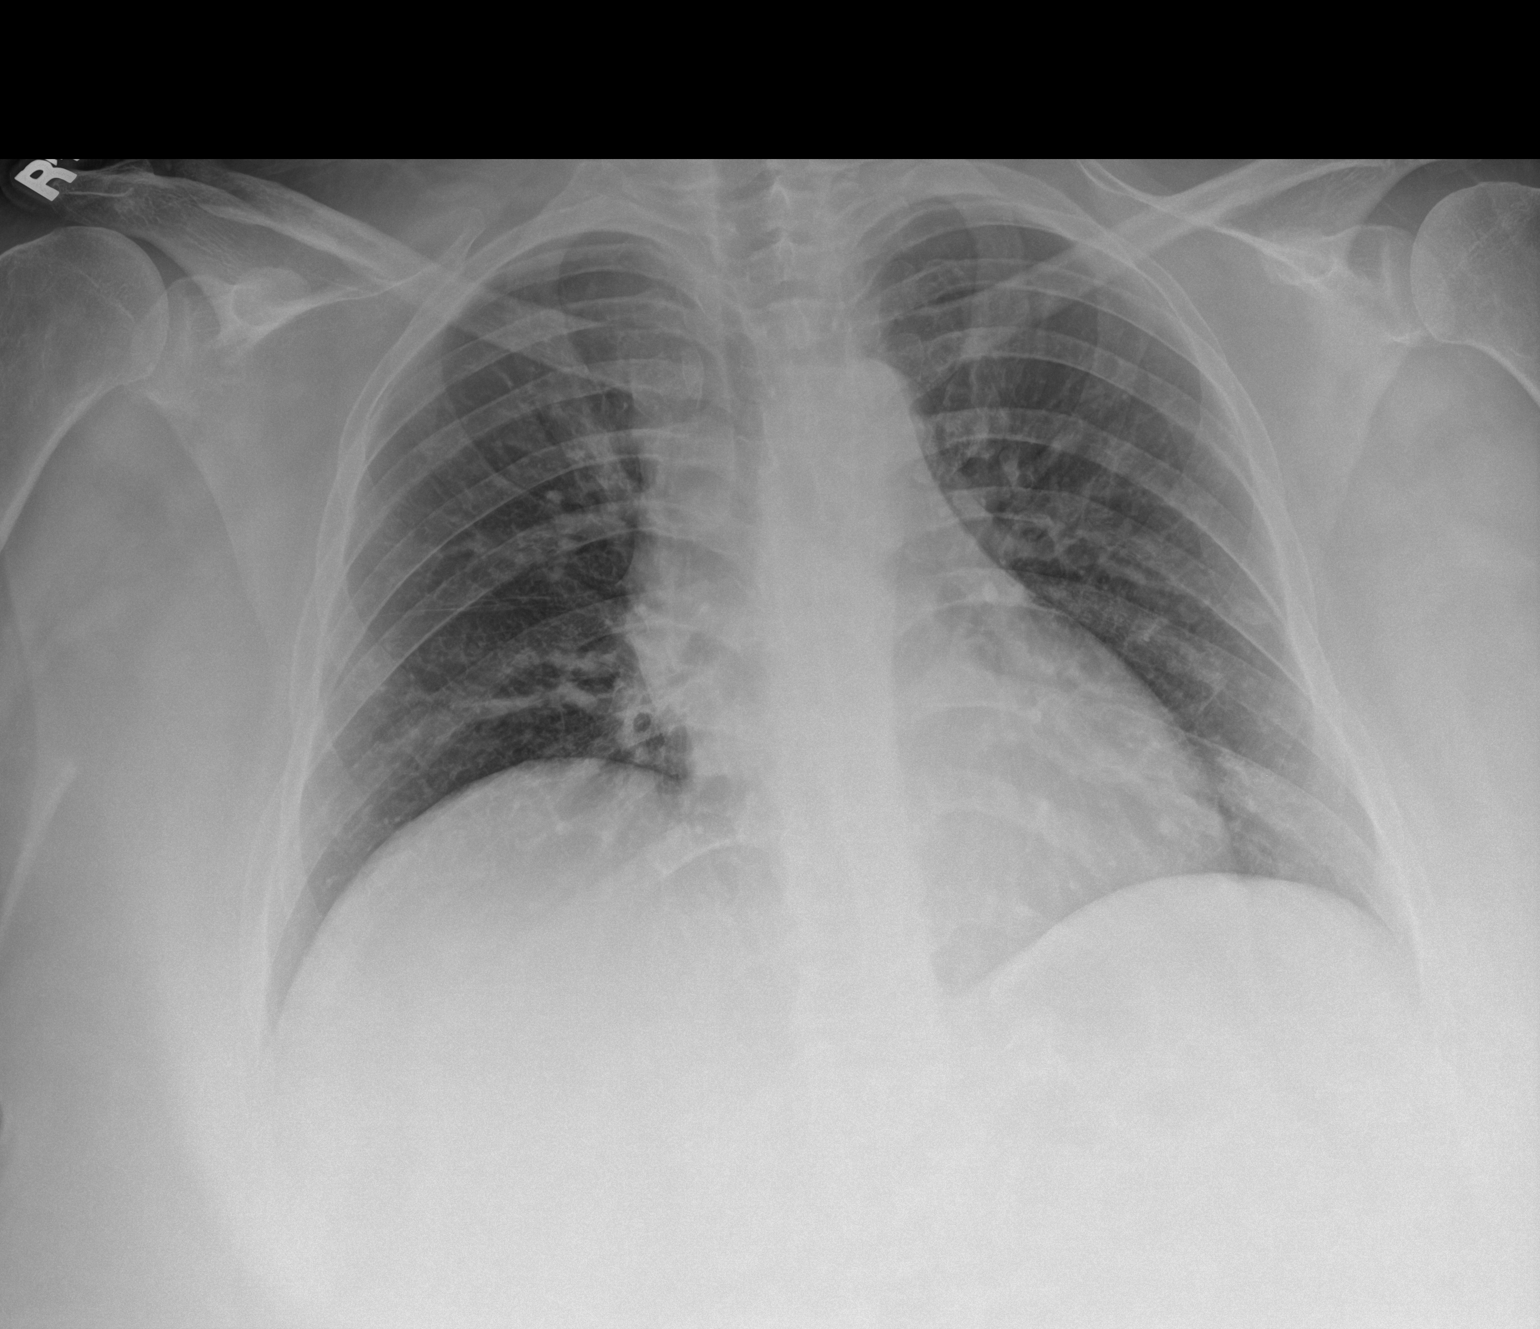

[1 of 1 positions shown; findings below may reference images not displayed]

FINDINGS: No edema or consolidation. Heart size and pulmonary vascularity are
normal. No adenopathy. No bone lesions.
IMPRESSION: No edema or consolidation.  No evident adenopathy.

## 2021-10-05 ENCOUNTER — Other Ambulatory Visit: Payer: Self-pay | Admitting: Gastroenterology

## 2021-10-06 LAB — URINE CULTURE: Culture: >=100000 — AB

## 2021-10-12 ENCOUNTER — Telehealth: Payer: Self-pay | Admitting: Pharmacist

## 2021-10-12 ENCOUNTER — Telehealth: Payer: BC Managed Care – PPO

## 2021-10-12 NOTE — Telephone Encounter (Signed)
  Chronic Care Management   Note  10/12/2021 Name: Maria Bentley MRN: [id number] DOB: 1971-11-12   Attempted to contact patient for scheduled appointment for medication management support. Left HIPAA compliant message for patient to return my call at their convenience. Third unsuccessful scheduling attempt, so will not attempt to reschedule.    Catie Darnelle Maffucci, PharmD, [place], [place] Clinical Pharmacist [employer] [facility] [employer] [phone]

## 2021-11-14 ENCOUNTER — Emergency Department
Admission: EM | Admit: 2021-11-14 | Discharge: 2021-11-14 | Disposition: A | Discharge status: Home / Self Care | Payer: BC Managed Care – PPO | Attending: Emergency Medicine | Admitting: Emergency Medicine

## 2021-11-14 ENCOUNTER — Other Ambulatory Visit: Payer: Self-pay

## 2021-11-14 DIAGNOSIS — I129 Hypertensive chronic kidney disease with stage 1 through stage 4 chronic kidney disease, or unspecified chronic kidney disease: Secondary | ICD-10-CM | POA: Diagnosis not present

## 2021-11-14 DIAGNOSIS — R739 Hyperglycemia, unspecified: Secondary | ICD-10-CM

## 2021-11-14 DIAGNOSIS — R300 Dysuria: Secondary | ICD-10-CM | POA: Diagnosis present

## 2021-11-14 DIAGNOSIS — E1165 Type 2 diabetes mellitus with hyperglycemia: Secondary | ICD-10-CM | POA: Insufficient documentation

## 2021-11-14 DIAGNOSIS — Z79899 Other long term (current) drug therapy: Secondary | ICD-10-CM | POA: Insufficient documentation

## 2021-11-14 DIAGNOSIS — E1110 Type 2 diabetes mellitus with ketoacidosis without coma: Secondary | ICD-10-CM | POA: Insufficient documentation

## 2021-11-14 DIAGNOSIS — Z87891 Personal history of nicotine dependence: Secondary | ICD-10-CM | POA: Insufficient documentation

## 2021-11-14 DIAGNOSIS — E1122 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 disease: Secondary | ICD-10-CM | POA: Diagnosis not present

## 2021-11-14 DIAGNOSIS — N1831 Chronic kidney disease, stage 3a: Secondary | ICD-10-CM | POA: Diagnosis not present

## 2021-11-14 DIAGNOSIS — E039 Hypothyroidism, unspecified: Secondary | ICD-10-CM | POA: Insufficient documentation

## 2021-11-14 DIAGNOSIS — Z8616 Personal history of COVID-19: Secondary | ICD-10-CM | POA: Insufficient documentation

## 2021-11-14 DIAGNOSIS — Z794 Long term (current) use of insulin: Secondary | ICD-10-CM | POA: Insufficient documentation

## 2021-11-14 DIAGNOSIS — N3000 Acute cystitis without hematuria: Secondary | ICD-10-CM | POA: Diagnosis not present

## 2021-11-14 LAB — CBC
HCT: 38.2 % (ref 36.0–46.0)
Hemoglobin: 10.8 g/dL — ABNORMAL LOW (ref 12.0–15.0)
MCH: 24.1 pg — ABNORMAL LOW (ref 26.0–34.0)
MCHC: 28.3 g/dL — ABNORMAL LOW (ref 30.0–36.0)
MCV: 85.1 fL (ref 80.0–100.0)
Platelets: 454 10*3/uL — ABNORMAL HIGH (ref 150–400)
RBC: 4.49 MIL/uL (ref 3.87–5.11)
RDW: 16.9 % — ABNORMAL HIGH (ref 11.5–15.5)
WBC: 10.4 10*3/uL (ref 4.0–10.5)
nRBC: 0 % (ref 0.0–0.2)

## 2021-11-14 LAB — COMPREHENSIVE METABOLIC PANEL
ALT: 13 U/L (ref 0–44)
AST: 16 U/L (ref 15–41)
Albumin: 3.3 g/dL — ABNORMAL LOW (ref 3.5–5.0)
Alkaline Phosphatase: 90 U/L (ref 38–126)
Anion gap: 8 (ref 5–15)
BUN: 27 mg/dL — ABNORMAL HIGH (ref 6–20)
CO2: 25 mmol/L (ref 22–32)
Calcium: 9.2 mg/dL (ref 8.9–10.3)
Chloride: 103 mmol/L (ref 98–111)
Creatinine, Ser: 1.3 mg/dL — ABNORMAL HIGH (ref 0.44–1.00)
GFR, Estimated: 50 mL/min — ABNORMAL LOW (ref >60)
Glucose, Bld: 400 mg/dL — ABNORMAL HIGH (ref 70–99)
Potassium: 4.5 mmol/L (ref 3.5–5.1)
Sodium: 136 mmol/L (ref 135–145)
Total Bilirubin: 0.4 mg/dL (ref 0.3–1.2)
Total Protein: 6.9 g/dL (ref 6.5–8.1)

## 2021-11-14 LAB — URINALYSIS, COMPLETE (UACMP) WITH MICROSCOPIC
Bilirubin Urine: NEGATIVE
Glucose, UA: 250 mg/dL — AB
Ketones, ur: NEGATIVE mg/dL
Nitrite: NEGATIVE
Protein, ur: NEGATIVE mg/dL
Specific Gravity, Urine: 1.015 (ref 1.005–1.030)
WBC, UA: >50 WBC/hpf (ref 0–5)
pH: 5.5 (ref 5.0–8.0)

## 2021-11-14 LAB — CBG MONITORING, ED: Glucose-Capillary: 364 mg/dL — ABNORMAL HIGH (ref 70–99)

## 2021-11-14 MED ORDER — PHENAZOPYRIDINE HCL 95 MG PO TABS: 95.0000 mg | ORAL_TABLET | Freq: Three times a day (TID) | ORAL | 0 refills | Status: DC | PRN

## 2021-11-14 MED ORDER — CEPHALEXIN 500 MG PO CAPS: 500.0000 mg | ORAL_CAPSULE | Freq: Two times a day (BID) | ORAL | 0 refills | Status: AC

## 2021-11-14 NOTE — ED Provider Notes (Signed)
[facility] Emergency Department Provider Note   ____________________________________________    I have reviewed the triage vital signs and the nursing notes.   HISTORY  Chief Complaint Weakness and Hyperglycemia     HPI Maria Bentley is a 50 y.o. female with a history of diabetes and additional history as detailed below who presents with complaints of dysuria and elevated glucose.  Patient reports she has had burning with urination for several days now.  No fevers reported.  Has had some generalized weakness.  Has increased her NovoLog in an attempt to control her glucose  Past Medical History:  Diagnosis Date   Abscess of right buttock 04/30/2019   Anemia    Anxiety    COVID-19 virus infection    Depression 2010   Diabetes mellitus    DKA, type 2 ([place]) 10/13/2019   Dysrhythmia    Endometriosis    Headache, common migraine, intractable    History of kidney stones    History of ovarian cyst    left sided, caused by her endometriosis (possibly an endometrioma), in her 21s.   Hyperlipidemia    Hypertension    Kidney stones    left   Morbid obesity ([place])    s/p gastric bypass   Personal hx of gastric bypass 2010   Rosacea    Vertigo     Patient Active Problem List   Diagnosis Date Noted   Polyp of transverse colon    COVID-19 long hauler manifesting chronic concentration deficit 07/09/2021   Encounter for screening colonoscopy    Diarrhea 09/04/2020   Anemia in chronic kidney disease 08/25/2020   Hematuria 08/25/2020   Stage 3a chronic kidney disease ([place]) 08/25/2020   Analgesic overuse headache 08/24/2020   Atypical facial pain 08/24/2020   Bruxism (teeth grinding) 08/24/2020   Cervical syndrome 08/24/2020   Disorder of autonomic nervous system 08/24/2020   Elevated blood-pressure reading without diagnosis of hypertension 08/24/2020   Fibromyositis 08/24/2020   Temporomandibular joint disorder 08/24/2020   Hypoglycemic  reaction 08/21/2020   Coronary atherosclerosis due to calcified coronary lesion 08/21/2020   Renal mass, left 07/06/2020   Chronic right shoulder pain 02/09/2020   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 disease ([place]) 01/26/2020   Recurrent headache 01/13/2020   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 12/30/2019   Elevated alkaline phosphatase level 11/21/2019   Hospital discharge follow-up 11/04/2019   Cervical spondylosis without myelopathy 10/29/2019   Pressure injury of skin 10/16/2019   History of gastric bypass 10/13/2019   COVID-19 virus infection 10/06/2019   Preoperative evaluation of a medical condition to rule out surgical contraindications (TAR required) 09/21/2019   Morbid obesity with BMI of 40.0-44.9, adult ([place]) 06/24/2019   History of bariatric surgery 04/15/2019   Cervicocranial syndrome 11/12/2018   Depressive disorder 11/12/2018   Fibromyalgia 06/05/2018   Iron deficiency anemia 06/05/2018   Enteritis, enteropathogenic E. 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   Morbid obesity ([place]) 06/09/2012   Anxiety    Headache, common migraine, intractable    Hyperlipidemia    Hypothyroid  OSA (obstructive sleep apnea) 02/05/2012    Past Surgical History:  Procedure Laterality Date   CARPAL TUNNEL RELEASE Right 11/26/2020   Procedure: CARPAL TUNNEL RELEASE;  Surgeon: Hessie Knows, MD;  Location: [facility] ORS;  Service: Orthopedics;  Laterality: Right;   COLONOSCOPY WITH PROPOFOL N/A 10/21/2020   Procedure: COLONOSCOPY WITH PROPOFOL;  Surgeon: Virgel Manifold, MD;   Location: [facility] END[facility];  Service: Endoscopy;  Laterality: N/A;   COLONOSCOPY WITH PROPOFOL N/A 09/15/2021   Procedure: COLONOSCOPY WITH PROPOFOL;  Surgeon: Virgel Manifold, MD;  Location: [facility] END[facility];  Service: Endoscopy;  Laterality: N/A;   DILATION AND CURETTAGE OF UTERUS  2011   gastric bypass     OPEN REDUCTION INTERNAL FIXATION (ORIF) DISTAL RADIAL FRACTURE Right 11/26/2020   Procedure: Open reduction internal fixation, right distal radius;  Surgeon: Hessie Knows, MD;  Location: [facility] ORS;  Service: Orthopedics;  Laterality: Right;   OVARIAN CYST REMOVAL     ROTATOR CUFF REPAIR     right   SHOULDER ARTHROSCOPY WITH SUBACROMIAL DECOMPRESSION AND OPEN ROTATOR C Right 07/19/2020   Procedure: Right shoulder arthroscopic vs mini-open rotator cuff repair vs Regeneten patch application, subacomial dcompression, and biceps tenodesis;  Surgeon: Leim Fabry, MD;  Location: [facility] ORS;  Service: Orthopedics;  Laterality: Right;    Prior to Admission medications   Medication Sig Start Date End Date Taking? Authorizing Provider  cephALEXin (KEFLEX) 500 MG capsule Take 1 capsule (500 mg total) by mouth 2 (two) times daily for 10 days. 11/14/21 11/24/21 Yes Lavonia Drafts, MD  albuterol (VENTOLIN HFA) 108 (90 Base) MCG/ACT inhaler Inhale 2 puffs into the lungs every 6 (six) hours as needed for wheezing or shortness of breath. 10/02/19   Terald Sleeper, PA-C  amitriptyline (ELAVIL) 25 MG tablet TAKE 1 TABLET BY MOUTH EVERYDAY AT BEDTIME 08/08/21   [name], MD  aspirin-acetaminophen-caffeine (EXCEDRIN MIGRAINE) [phone] MG tablet Take 2 tablets by mouth every 6 (six) hours as needed for headache.    [provider]  atorvastatin (LIPITOR) 20 MG tablet TAKE 1 TABLET BY MOUTH EVERY DAY 10/03/21   [name], MD  buPROPion (WELLBUTRIN XL) 150 MG 24 hr tablet Take 450 mg by mouth daily.  05/12/18   [provider]  CLINPRO 5000 1.1 % PSTE Place 1 application onto teeth at  bedtime.  06/15/20   [provider]  Continuous Blood Gluc Receiver (FREESTYLE LIBRE 14 DAY READER) DEVI 1 applicator by Does not apply route 4 (four) times daily. 11/24/17   [name], MD  Continuous Blood Gluc Sensor (FREESTYLE LIBRE 14 DAY SENSOR) MISC USE TO CHECK BLOOD SUGAR FOUR TIMES A DAY AS NEEDED 09/28/20   [name], MD  cyanocobalamin (,VITAMIN B-12,) 1000 MCG/ML injection Inject 1 mL (1,000 mcg total) into the muscle once a week. For 4 weeks, then monthly thereafter 07/13/21   [name], MD  cyclobenzaprine (FLEXERIL) 10 MG tablet Take 0.5-1 tablets (5-10 mg total) by mouth 3 (three) times daily as needed for muscle spasms. 07/07/21   [name], MD  diphenhydrAMINE (BENADRYL) 25 MG tablet Take 25 mg by mouth every 6 (six) hours as needed for allergies.    [provider]  doxycycline (MONODOX) 100 MG capsule Take 1 capsule (100 mg total) by mouth 2 (two) times daily. 09/09/21   Sable Feil, PA-C  EPINEPHrine 0.3 mg/0.3 mL IJ SOAJ injection Inject 0.3 mg into the muscle as needed for anaphylaxis.  06/25/10  [provider]  escitalopram (LEXAPRO) 20 MG tablet Take 20 mg by mouth daily.  02/27/17   [provider]  fluticasone (FLONASE) 50 MCG/ACT nasal spray SPRAY 2 SPRAYS INTO EACH NOSTRIL EVERY DAY 04/21/21   [name], MD  furosemide (LASIX) 20 MG tablet TAKE 1 TABLET (20 MG TOTAL) BY MOUTH DAILY. AS NEEDED FOR FLUID RETENTION 02/01/21   [name], MD  HORIZANT 600 MG TBCR Take 1 tablet (600 mg total) by mouth 2 (two) times daily. 07/07/21   [name], MD  Insulin Glargine [facility] KWIKPEN) 100 UNIT/ML INJECT 33 UNITS INTO THE SKIN AT BEDTIME. 08/08/21   [name], MD  Insulin Pen Needle (BD PEN NEEDLE NANO U/F) 32G X 4 MM MISC Please specify directions, refills and quantity 08/22/18   [name], MD  lamoTRIgine (LAMICTAL) 150 MG tablet Take 150 mg by mouth daily.    [provider]   levothyroxine (SYNTHROID) 50 MCG tablet TAKE 1 TABLET BY MOUTH EVERY DAY 01/13/21   [name], MD  lipase/protease/amylase (CREON) 36000 UNITS CPEP capsule TAKE 2 CAPSULES BY MOUTH 3 (THREE) TIMES DAILY BEFORE MEALS AND 1 CAPSULE WITH SNACKS. 10/06/21   Virgel Manifold, MD  loperamide (IMODIUM) 2 MG capsule Take 2 capsules (4 mg total) by mouth as needed for diarrhea or loose stools. 10/25/19   Amin, Jeanella Flattery, MD  LORazepam (ATIVAN) 0.5 MG tablet Take 0.5-1 mg by mouth daily as needed for anxiety.     [provider]  losartan (COZAAR) 25 MG tablet Take 1 tablet by mouth daily. 09/28/20 09/28/21  [provider]  meclizine (ANTIVERT) 25 MG tablet Take 1 tablet (25 mg total) by mouth 3 (three) times daily as needed for dizziness. 06/16/15   [name], MD  Melatonin 10 MG TABS Take 10 mg by mouth at bedtime as needed (sleep).     [provider]  metoprolol tartrate (LOPRESSOR) 25 MG tablet Take 0.5 tablets (12.5 mg total) by mouth 2 (two) times daily. 07/07/21   [name], MD  NOVOLOG FLEXPEN 100 UNIT/ML FlexPen INJECT 5-20 UNITS INTO THE SKIN 3 (THREE) TIMES DAILY WITH MEALS. INJECT 15 UNITS IN THE MORNING, 20 UNITS AT LUNCH AND 5-10 UNITS WITH DINNER 06/28/21   [name], MD  nystatin (MYCOSTATIN/NYSTOP) powder APPLY 1 APPLICATION TO AFFECTED AREA TWICE A DAY 12/23/20   Leone Haven, MD  OnabotulinumtoxinA (BOTOX IJ) Inject as directed every 3 (three) months. For migraine    [provider]  ondansetron (ZOFRAN ODT) 4 MG disintegrating tablet Take 1 tablet (4 mg total) by mouth every 8 (eight) hours as needed for nausea or vomiting. 10/03/21   Blake Divine, MD  phenazopyridine (PYRIDIUM) 200 MG tablet Take 1 tablet (200 mg total) by mouth 3 (three) times daily as needed for pain. 09/09/21   Sable Feil, PA-C  Rimegepant Sulfate (NURTEC PO) Take by mouth as needed.    [provider]  Sod Picosulfate-Mag Ox-Cit Acd  (CLENPIQ) 10-3.5-12 MG-GM -GM/160ML SOLN 5pm night before procedure drink 1 bottle, 5 hours procedure drink 1 bottle 08/30/21   Tahiliani, Varnita B, MD  SYRINGE-NEEDLE, DISP, 3 ML (BD INTEGRA SYRINGE) 25G X 1" 3 ML MISC FOR USE WITH B12 INJECTIONS WEEKLY/MONTHLY 08/01/18   [name], MD     Allergies Amoxicillin, Avocado, Clarithromycin, Clarithromycin, Honey, Other, Sulfa antibiotics, Sulfa drugs cross reactors, Watermelon concentrate, Insulin detemir, Tape, and Victoza [liraglutide]  Family  History  Problem Relation Age of Onset   Diabetes Mother    Hypertension Mother    Hypertension Father    Diabetes Father    Depression Other        strong hx of   Breast cancer Paternal Grandmother 4   Breast cancer Maternal Grandmother    Cancer Neg Hx     Social History Social History   Tobacco Use   Smoking status: Former    Packs/day: 1.00    Years: 4.00    Pack years: 4.00    Types: Cigarettes    Quit date: 02/05/1996    Years since quitting: 25.7   Smokeless tobacco: Never  Vaping Use   Vaping Use: Never used  Substance Use Topics   Alcohol use: No   Drug use: No    Review of Systems  Constitutional: No fever/chills Eyes: No visual changes.  ENT: No sore throat. Cardiovascular: Denies chest pain. Respiratory: Denies shortness of breath. Gastrointestinal: No abdominal pain.  No nausea, no vomiting.   Genitourinary: As above Musculoskeletal: Negative for back pain. Skin: Negative for rash. Neurological: Negative for headaches or weakness   ____________________________________________   PHYSICAL EXAM:  VITAL SIGNS: ED Triage Vitals  Enc Vitals Group     BP 11/14/21 0805 134/84     Pulse Rate 11/14/21 0805 82     Resp 11/14/21 0805 20     Temp 11/14/21 0805 97.8 F (36.6 C)     Temp Source 11/14/21 0805 Oral     SpO2 11/14/21 0805 95 %     Weight 11/14/21 0806 95.7 kg (211 lb)     Height 11/14/21 0806 1.626 m (5\' 4" )     Head Circumference --       Peak Flow --      Pain Score 11/14/21 0805 7     Pain Loc --      Pain Edu? --      Excl. in [place]? --     Constitutional: Alert and oriented. No acute distress. Pleasant and interactive Eyes: Conjunctivae are normal.  Head: Atraumatic.  Cardiovascular: Normal rate, regular rhythm.Kermit Balo peripheral circulation. Respiratory: Normal respiratory effort.  No retractions.    Musculoskeletal: Warm and well perfused Neurologic:  Normal speech and language. No gross focal neurologic deficits are appreciated.  Skin:  Skin is warm, dry and intact. No rash noted. Psychiatric: Mood and affect are normal. Speech and behavior are normal.  ____________________________________________   LABS (all labs ordered are listed, but only abnormal results are displayed)  Labs Reviewed  CBC - Abnormal; Notable for the following components:      Result Value   Hemoglobin 10.8 (*)    MCH 24.1 (*)    MCHC 28.3 (*)    RDW 16.9 (*)    Platelets 454 (*)    All other components within normal limits  COMPREHENSIVE METABOLIC PANEL - Abnormal; Notable for the following components:   Glucose, Bld 400 (*)    BUN 27 (*)    Creatinine, Ser 1.30 (*)    Albumin 3.3 (*)    GFR, Estimated 50 (*)    All other components within normal limits  URINALYSIS, COMPLETE (UACMP) WITH MICROSCOPIC - Abnormal; Notable for the following components:   Glucose, UA 250 (*)    Hgb urine dipstick TRACE (*)    Leukocytes,Ua SMALL (*)    Bacteria, UA MANY (*)    All other components within normal limits  CBG MONITORING, ED -  Abnormal; Notable for the following components:   Glucose-Capillary 364 (*)    All other components within normal limits   ____________________________________________  EKG   ____________________________________________  RADIOLOGY   ____________________________________________   PROCEDURES  Procedure(s) performed: No  Procedures   Critical Care performed:  No ____________________________________________   INITIAL IMPRESSION / ASSESSMENT AND PLAN / ED COURSE  Pertinent labs & imaging results that were available during my care of the patient were reviewed by me and considered in my medical decision making (see chart for details).   Patient well-appearing and in no acute distress, vital signs reassuring, mildly elevated glucose, normal anion gap Urinalysis is consistent with urinary tract infection, has grown Klebsiella on previous urine cultures, susceptible to Keflex, does have amoxicillin allergy, very low cross-reactivity between cephalosporins and amoxicillin, discussed this antibiotic with her, outpatient follow-up Dr. [name] to adjust insulin dosing as needed.    ____________________________________________   FINAL CLINICAL IMPRESSION(S) / ED DIAGNOSES  Final diagnoses:  Hyperglycemia  Acute cystitis without hematuria        Note:  This document was prepared using Dragon voice recognition software and may include unintentional dictation errors.    Lavonia Drafts, MD 11/14/21 1126

## 2021-11-14 NOTE — ED Triage Notes (Signed)
Pt in with co body aches, nausea for over a week. Has had frequent UTI's with some dysuria at present. Did check cbg at home at read high.

## 2021-11-23 ENCOUNTER — Other Ambulatory Visit: Payer: Self-pay | Admitting: Internal Medicine

## 2021-11-23 ENCOUNTER — Encounter: Payer: Self-pay | Admitting: Internal Medicine

## 2021-11-23 ENCOUNTER — Telehealth: Payer: Self-pay | Admitting: *Deleted

## 2021-11-23 DIAGNOSIS — N390 Urinary tract infection, site not specified: Secondary | ICD-10-CM

## 2021-11-23 NOTE — Telephone Encounter (Signed)
I have pended the orders if this is okay to do on Monday at her appt.

## 2021-11-23 NOTE — Telephone Encounter (Signed)
See phone note

## 2021-11-24 ENCOUNTER — Other Ambulatory Visit: Payer: Self-pay | Admitting: Internal Medicine

## 2021-11-28 ENCOUNTER — Ambulatory Visit (INDEPENDENT_AMBULATORY_CARE_PROVIDER_SITE_OTHER): Payer: BC Managed Care – PPO | Admitting: Internal Medicine

## 2021-11-28 ENCOUNTER — Encounter: Payer: Self-pay | Admitting: Internal Medicine

## 2021-11-28 ENCOUNTER — Other Ambulatory Visit: Payer: Self-pay

## 2021-11-28 VITALS — BP 142/90 | HR 110 | Temp 98.5°F | Ht 64.0 in | Wt 214.8 lb

## 2021-11-28 DIAGNOSIS — E1122 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 disease: Secondary | ICD-10-CM

## 2021-11-28 DIAGNOSIS — E033 Postinfectious hypothyroidism: Secondary | ICD-10-CM | POA: Diagnosis not present

## 2021-11-28 DIAGNOSIS — I10 Essential (primary) hypertension: Secondary | ICD-10-CM

## 2021-11-28 DIAGNOSIS — U099 Post covid-19 condition, unspecified: Secondary | ICD-10-CM

## 2021-11-28 DIAGNOSIS — N390 Urinary tract infection, site not specified: Secondary | ICD-10-CM | POA: Diagnosis not present

## 2021-11-28 DIAGNOSIS — G9332 Myalgic encephalomyelitis/chronic fatigue syndrome: Secondary | ICD-10-CM

## 2021-11-28 DIAGNOSIS — N182 Chronic kidney disease, stage 2 (mild): Secondary | ICD-10-CM | POA: Diagnosis not present

## 2021-11-28 DIAGNOSIS — E7800 Pure hypercholesterolemia, unspecified: Secondary | ICD-10-CM | POA: Diagnosis not present

## 2021-11-28 DIAGNOSIS — H04123 Dry eye syndrome of bilateral lacrimal glands: Secondary | ICD-10-CM

## 2021-11-28 DIAGNOSIS — M5412 Radiculopathy, cervical region: Secondary | ICD-10-CM

## 2021-11-28 DIAGNOSIS — Z87440 Personal history of urinary (tract) infections: Secondary | ICD-10-CM

## 2021-11-28 DIAGNOSIS — R000 Tachycardia, unspecified: Secondary | ICD-10-CM

## 2021-11-28 LAB — POCT GLYCOSYLATED HEMOGLOBIN (HGB A1C): Hemoglobin A1C: 12.1 % — AB (ref 4.0–5.6)

## 2021-11-28 MED ORDER — METOPROLOL TARTRATE 25 MG PO TABS: 25.0000 mg | ORAL_TABLET | Freq: Two times a day (BID) | ORAL | 1 refills | Status: DC

## 2021-11-28 MED ORDER — LOSARTAN POTASSIUM 25 MG PO TABS: 25.0000 mg | ORAL_TABLET | Freq: Every day | ORAL | 1 refills | Status: DC

## 2021-11-28 NOTE — Assessment & Plan Note (Signed)
Continue losartan 25 mg daily and metoprolol 25 mg bid

## 2021-11-28 NOTE — Assessment & Plan Note (Signed)
Uncontrolled  Chronically, despite use of Novolog tid,  Glargine and Free Style Libre Monitor.  Unclear whey seh cannot control her BS given the resources that she has .   referring back ro Catie Lavone Nian, PhD

## 2021-11-28 NOTE — Assessment & Plan Note (Signed)
Resume metoprolol 25 mg bid

## 2021-11-28 NOTE — Assessment & Plan Note (Signed)
Referring to  Man for evaluation and treatment.

## 2021-11-28 NOTE — Progress Notes (Signed)
Subjective:  Patient ID: Maria Bentley, female    DOB: 02/24/1971  Age: 50 y.o. MRN: [id number]  CC: The primary encounter diagnosis was Primary hypertension. Diagnoses of Type 2 diabetes mellitus with stage 2 chronic kidney disease, without long-term current use of insulin ([place]), Pure hypercholesterolemia, Postinfectious hypothyroidism, Recurrent UTI, Dry eye syndrome of both eyes, Radiculitis, cervical, COVID-19 long hauler manifesting chronic fatigue, Tachycardia, and History of UTI were also pertinent to this visit.  HPI Robynne Roat presents for follow up on uncontrolled type  2 DM Chief Complaint  Patient presents with   Follow-up    diabetes   This visit occurred during the SARS-CoV-2 public health emergency.  Safety protocols were in place, including screening questions prior to the visit, additional usage of staff PPE, and extensive cleaning of exam room while observing appropriate contact time as indicated for disinfecting solutions.    T2DM:  She  finally feels somewhat  better ,  But is not  exercising regularly or trying to lose weight. Checking  blood sugars less than once daily at variable times, usually only if she feels she may be having a hypoglycemic event. .  BS have been  running  300 to 500 .  Attributed to persistent UTI.  Restricting carbs in diet to < 60 per day .  Taking novolog with every meal currently 20 units  at breakfast and lunch and ssi added based on premeal BS and carb count.  Using glargine , 40 units daily .  Had a low sugar last night at 10 pm after taking 15 units plus SSI but has been in consistent  with Taking   medications due to depression,  long COVID and retirement as directed.  Using the [facility] monitor Number 1 wants one of the newer ones.  Last eye exam was in June per patient with [facility] .  Wakes up with dry eye. Has tried using thera tears  Using systane, refresh gel   Long COVID.  HAS BEEN unable to make contact with  [facility] and with Catie due to loss of cell phone .   Need a local PT referral for PT>   Reche [facility]  for neck .    Has had ESI in lumbar spine by [facility] in late august.  Told  by remote Ortho  she has a "pinched nerve" in her cervical spine with radiculopathy to right hand.   Occasional weakness . Seeing a Restaurant manager, fast food.   Had surgery on right shoulder one year ago by Posey Pronto, and surgery on the right  wrist Nov 2021 to repair a traumatic fracture.  No recent C spine or MRI    Treated by [facility]  in July for Klebsiella UTI  with wrong antibiotics.   Symptoms did not resolve, was sent to ER and given doxycycline.  Treated twice  more with cephalexin ,  last dose of abx was 5 days ago .  Still having urgency and frequency.   Outpatient Medications Prior to Visit  Medication Sig Dispense Refill   albuterol (VENTOLIN HFA) 108 (90 Base) MCG/ACT inhaler Inhale 2 puffs into the lungs every 6 (six) hours as needed for wheezing or shortness of breath. 18 g 0   amitriptyline (ELAVIL) 25 MG tablet TAKE 1 TABLET BY MOUTH EVERYDAY AT BEDTIME 90 tablet 1   aspirin-acetaminophen-caffeine (EXCEDRIN MIGRAINE) 250-250-65 MG tablet Take 2 tablets by mouth every 6 (six) hours as needed for headache.     atorvastatin (  LIPITOR) 20 MG tablet TAKE 1 TABLET BY MOUTH EVERY DAY 90 tablet 1   Azelastine HCl 137 MCG/SPRAY SOLN SMARTSIG:1-2 Spray(s) Both Nares Twice Daily     buPROPion (WELLBUTRIN XL) 150 MG 24 hr tablet Take 450 mg by mouth daily.   3   CLINPRO 5000 1.1 % PSTE Place 1 application onto teeth at bedtime.      Continuous Blood Gluc Receiver (FREESTYLE LIBRE 14 DAY READER) DEVI 1 applicator by Does not apply route 4 (four) times daily. 1 Device 1   Continuous Blood Gluc Sensor (FREESTYLE LIBRE 14 DAY SENSOR) MISC USE TO CHECK BLOOD SUGAR FOUR TIMES A DAY AS NEEDED 2 each 11   cyanocobalamin (,VITAMIN B-12,) 1000 MCG/ML injection INJECT 1 ML (1,000 MCG TOTAL) INTO THE MUSCLE ONCE A WEEK. FOR 4 WEEKS, THEN  MONTHLY THEREAFTER 3 mL 0   cyclobenzaprine (FLEXERIL) 10 MG tablet Take 0.5-1 tablets (5-10 mg total) by mouth 3 (three) times daily as needed for muscle spasms. 30 tablet 12   diphenhydrAMINE (BENADRYL) 25 MG tablet Take 25 mg by mouth every 6 (six) hours as needed for allergies.     EPINEPHrine 0.3 mg/0.3 mL IJ SOAJ injection Inject 0.3 mg into the muscle as needed for anaphylaxis.      escitalopram (LEXAPRO) 20 MG tablet Take 20 mg by mouth daily.      fluticasone (FLONASE) 50 MCG/ACT nasal spray SPRAY 2 SPRAYS INTO EACH NOSTRIL EVERY DAY 48 mL 1   furosemide (LASIX) 20 MG tablet TAKE 1 TABLET (20 MG TOTAL) BY MOUTH DAILY. AS NEEDED FOR FLUID RETENTION 90 tablet 1   HORIZANT 600 MG TBCR Take 1 tablet (600 mg total) by mouth 2 (two) times daily. 60 tablet 5   Insulin Glargine (BASAGLAR KWIKPEN) 100 UNIT/ML INJECT 33 UNITS INTO THE SKIN AT BEDTIME. 15 mL 1   Insulin Pen Needle (BD PEN NEEDLE NANO U/F) 32G X 4 MM MISC Please specify directions, refills and quantity 100 each 11   lamoTRIgine (LAMICTAL) 150 MG tablet Take 150 mg by mouth daily.     levothyroxine (SYNTHROID) 50 MCG tablet TAKE 1 TABLET BY MOUTH EVERY DAY 90 tablet 0   lipase/protease/amylase (CREON) 36000 UNITS CPEP capsule TAKE 2 CAPSULES BY MOUTH 3 (THREE) TIMES DAILY BEFORE MEALS AND 1 CAPSULE WITH SNACKS. 240 capsule 3   LORazepam (ATIVAN) 0.5 MG tablet Take 0.5-1 mg by mouth daily as needed for anxiety.      meclizine (ANTIVERT) 25 MG tablet Take 1 tablet (25 mg total) by mouth 3 (three) times daily as needed for dizziness. 90 tablet 6   Melatonin 10 MG TABS Take 10 mg by mouth at bedtime as needed (sleep).      NOVOLOG FLEXPEN 100 UNIT/ML FlexPen INJECT 15 UNITS SUBCUTANEOUSLY IN THE MORNING, 20 UNITS AT LUNCH AND 5-10 UNITS WITH DINNER 15 mL 0   NURTEC 75 MG TBDP Take 1 tablet by mouth daily as needed.     nystatin (MYCOSTATIN/NYSTOP) powder APPLY 1 APPLICATION TO AFFECTED AREA TWICE A DAY 60 g 0   OnabotulinumtoxinA (BOTOX  IJ) Inject as directed every 3 (three) months. For migraine     ondansetron (ZOFRAN ODT) 4 MG disintegrating tablet Take 1 tablet (4 mg total) by mouth every 8 (eight) hours as needed for nausea or vomiting. 12 tablet 0   Rimegepant Sulfate (NURTEC PO) Take by mouth as needed.     SYRINGE-NEEDLE, DISP, 3 ML (BD INTEGRA SYRINGE) 25G X 1" 3 ML MISC FOR  USE WITH B12 INJECTIONS WEEKLY/MONTHLY 25 each 0   doxycycline (MONODOX) 100 MG capsule Take 1 capsule (100 mg total) by mouth 2 (two) times daily. 20 capsule 0   loperamide (IMODIUM) 2 MG capsule Take 2 capsules (4 mg total) by mouth as needed for diarrhea or loose stools. 30 capsule 0   losartan (COZAAR) 25 MG tablet Take 1 tablet by mouth daily.     metoprolol tartrate (LOPRESSOR) 25 MG tablet Take 0.5 tablets (12.5 mg total) by mouth 2 (two) times daily. (Patient not taking: Reported on 11/28/2021) 90 tablet 3   phenazopyridine (PYRIDIUM) 95 MG tablet Take 1 tablet (95 mg total) by mouth 3 (three) times daily as needed for pain. (Patient not taking: Reported on 11/28/2021) 10 tablet 0   Sod Picosulfate-Mag Ox-Cit Acd (CLENPIQ) 10-3.5-12 MG-GM -GM/160ML SOLN 5pm night before procedure drink 1 bottle, 5 hours procedure drink 1 bottle 320 mL 0   No facility-administered medications prior to visit.    Review of Systems;  Patient denies headache, fevers, malaise, unintentional weight loss, skin rash, eye pain, sinus congestion and sinus pain, sore throat, dysphagia,  hemoptysis , cough, dyspnea, wheezing, chest pain, palpitations, orthopnea, edema, abdominal pain, nausea, melena, diarrhea, constipation, flank pain, dysuria, hematuria, urinary  Frequency, nocturia, numbness, tingling, seizures,  Focal weakness, Loss of consciousness,  Tremor, insomnia, depression, anxiety, and suicidal ideation.      Objective:  BP (!) 142/90 (BP Location: Left Arm, Patient Position: Sitting, Cuff Size: Normal)    Pulse (!) 110    Temp 98.5 F (36.9 C) (Oral)    Ht  _0  (1.626 m)    Wt 214 lb 12.8 oz (97.4 kg)    LMP 05/09/2016 (LMP Unknown) Comment: D/C   SpO2 93%    BMI 36.87 kg/m   BP Readings from Last 3 Encounters:  11/28/21 (!) 142/90  11/14/21 (!) 162/80  10/03/21 113/77    Wt Readings from Last 3 Encounters:  11/28/21 214 lb 12.8 oz (97.4 kg)  11/14/21 211 lb (95.7 kg)  10/03/21 225 lb (102.1 kg)    General appearance: alert, cooperative and appears stated age Ears: normal TM's and external ear canals both ears Throat: lips, mucosa, and tongue normal; teeth and gums normal Neck: no adenopathy, no carotid bruit, supple, symmetrical, trachea midline and thyroid not enlarged, symmetric, no tenderness/mass/nodules Back: symmetric, no curvature. ROM normal. No CVA tenderness. Lungs: clear to auscultation bilaterally Heart: regular rate and rhythm, S1, S2 normal, no murmur, click, rub or gallop Abdomen: soft, non-tender; bowel sounds normal; no masses,  no organomegaly Pulses: 2+ and symmetric Skin: Skin color, texture, turgor normal. No rashes or lesions Lymph nodes: Cervical, supraclavicular, and axillary nodes normal. Neuro: CNs 2-12 intact. DTRs 2+/4 in biceps, brachioradialis, patellars and achilles. Muscle strength 5/5 in upper and lower exremities. cerebellar function normal. Romberg negative.  No pronator drift.   Gait normal.    Lab Results  Component Value Date   HGBA1C 12.1 (A) 11/28/2021   HGBA1C 9.1 (A) 07/07/2021   HGBA1C 11.0 (H) 01/27/2021    Lab Results  Component Value Date   CREATININE 1.30 (H) 11/14/2021   CREATININE 1.50 (H) 10/03/2021   CREATININE 1.21 (H) 07/07/2021    Lab Results  Component Value Date   WBC 10.4 11/14/2021   HGB 10.8 (L) 11/14/2021   HCT 38.2 11/14/2021   PLT 454 (H) 11/14/2021   GLUCOSE 400 (H) 11/14/2021   CHOL 209 (H) 07/07/2021   TRIG 117.0 07/07/2021  HDL 48.40 07/07/2021   LDLDIRECT 92.0 02/21/2017   LDLCALC 137 (H) 07/07/2021   ALT 13 11/14/2021   AST 16 11/14/2021   NA  136 11/14/2021   K 4.5 11/14/2021   CL 103 11/14/2021   CREATININE 1.30 (H) 11/14/2021   BUN 27 (H) 11/14/2021   CO2 25 11/14/2021   TSH 1.63 09/15/2020   INR 0.9 10/25/2019   HGBA1C 12.1 (A) 11/28/2021   MICROALBUR 1.8 09/19/2019    No results found.  Assessment & Plan:   Problem List Items Addressed This Visit     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 disease ([place])    Uncontrolled  Chronically, despite use of Novolog tid,  Glargine and Free Style Libre Monitor.  Unclear whey seh cannot control her BS given the resources that she has .   referring back ro Catie Lavone Nian, PhD      Relevant Medications   losartan (COZAAR) 25 MG tablet   Other Relevant Orders   Comp Met (CMET)   Urine Microalbumin w/creat. ratio   POCT HgB A1C (Completed)   Tachycardia    Resume metoprolol 25 mg bid       Radiculitis, cervical    Referring to  [place] for evaluation and treatment.        Relevant Orders   Ambulatory referral to Orthopedic Surgery   Hypothyroid   Relevant Medications   metoprolol tartrate (LOPRESSOR) 25 MG tablet   Other Relevant Orders   TSH   Hypertension - Primary    Continue losartan 25 mg daily and metoprolol 25 mg bid       Relevant Medications   metoprolol tartrate (LOPRESSOR) 25 MG tablet   losartan (COZAAR) 25 MG tablet   Other Relevant Orders   Comp Met (CMET)   Urine Microalbumin w/creat. ratio   Hyperlipidemia   Relevant Medications   metoprolol tartrate (LOPRESSOR) 25 MG tablet   losartan (COZAAR) 25 MG tablet   Other Relevant Orders   Lipid Profile   History of UTI    Klebsiella UTI treated with cephalexin,  Then doxycycline,  Then cephalexin.  Symptoms since stopping abx 5 days ago,        COVID-19 long hauler manifesting chronic fatigue    Local PT referral needed per [facility]       Relevant Orders   Ambulatory referral to Physical Therapy   Other Visit Diagnoses     Recurrent UTI       Relevant Orders    Urinalysis, Routine w reflex microscopic   Urine Culture   Dry eye syndrome of both eyes       Relevant Orders   Sjogrens syndrome-A extractable nuclear antibody   Sjogrens syndrome-B extractable nuclear antibody       I have discontinued Derek Jack. Thai "Cindy"'s loperamide, Clenpiq, doxycycline, and phenazopyridine. I have also changed her metoprolol tartrate and losartan. Additionally, I am having her maintain her OnabotulinumtoxinA (BOTOX IJ), LORazepam, meclizine, EPINEPHrine, escitalopram, FreeStyle Libre 14 Day Reader, buPROPion, SYRINGE-NEEDLE (DISP) 3 ML, Insulin Pen Needle, lamoTRIgine, albuterol, aspirin-acetaminophen-caffeine, Clinpro 5000, diphenhydrAMINE, Melatonin, Rimegepant Sulfate (NURTEC PO), FreeStyle Libre 14 Day Sensor, nystatin, furosemide, fluticasone, Horizant, cyclobenzaprine, amitriptyline, atorvastatin, ondansetron, Creon, cyanocobalamin, levothyroxine, NovoLOG FlexPen, Basaglar KwikPen, Azelastine HCl, and Nurtec.  Meds ordered this encounter  Medications   metoprolol tartrate (LOPRESSOR) 25 MG tablet    Sig: Take 1 tablet (25 mg total) by mouth 2 (two) times daily.    Dispense:  180 tablet  Refill:  1    PLEASE FILL.  NOTE DOSE CHANGE   losartan (COZAAR) 25 MG tablet    Sig: Take 1 tablet (25 mg total) by mouth daily.    Dispense:  90 tablet    Refill:  1    I provided  30 minutes  during this encounter reviewing patient's current problems and past surgeries, labs and imaging studies, providing counseling on the above mentioned problems I na face to face visit  , and coordination  of care .    Follow-up: No follow-ups on file.   [name], MD

## 2021-11-28 NOTE — Assessment & Plan Note (Signed)
Local PT referral needed per [facility]

## 2021-11-28 NOTE — Patient Instructions (Signed)
Postpone the shingles vaccine until the spring  Referral to [facility] and [facility] in process  Re establish with  Catie Darnelle Maffucci

## 2021-11-28 NOTE — Assessment & Plan Note (Signed)
Klebsiella UTI treated with cephalexin,  Then doxycycline,  Then cephalexin.  Symptoms since stopping abx 5 days ago,

## 2021-11-29 LAB — COMPREHENSIVE METABOLIC PANEL
ALT: 13 U/L (ref 0–35)
AST: 13 U/L (ref 0–37)
Albumin: 3.7 g/dL (ref 3.5–5.2)
Alkaline Phosphatase: 99 U/L (ref 39–117)
BUN: 18 mg/dL (ref 6–23)
CO2: 27 mEq/L (ref 19–32)
Calcium: 9.4 mg/dL (ref 8.4–10.5)
Chloride: 103 mEq/L (ref 96–112)
Creatinine, Ser: 1.11 mg/dL (ref 0.40–1.20)
GFR: 57.61 mL/min — ABNORMAL LOW (ref >60.00)
Glucose, Bld: 66 mg/dL — ABNORMAL LOW (ref 70–99)
Potassium: 4 mEq/L (ref 3.5–5.1)
Sodium: 140 mEq/L (ref 135–145)
Total Bilirubin: 0.3 mg/dL (ref 0.2–1.2)
Total Protein: 6.7 g/dL (ref 6.0–8.3)

## 2021-11-29 LAB — LIPID PANEL
Cholesterol: 166 mg/dL (ref 0–200)
HDL: 55.1 mg/dL (ref >39.00)
LDL Cholesterol: 85 mg/dL (ref 0–99)
NonHDL: 110.72
Total CHOL/HDL Ratio: 3
Triglycerides: 127 mg/dL (ref 0.0–149.0)
VLDL: 25.4 mg/dL (ref 0.0–40.0)

## 2021-11-29 LAB — URINALYSIS, ROUTINE W REFLEX MICROSCOPIC
Bilirubin Urine: NEGATIVE
Hgb urine dipstick: NEGATIVE
Ketones, ur: NEGATIVE
Leukocytes,Ua: NEGATIVE
Nitrite: NEGATIVE
RBC / HPF: NONE SEEN (ref 0–?)
Specific Gravity, Urine: 1.025 (ref 1.000–1.030)
Total Protein, Urine: NEGATIVE
Urine Glucose: >=1000 — AB
Urobilinogen, UA: 0.2 (ref 0.0–1.0)
pH: 5.5 (ref 5.0–8.0)

## 2021-11-29 LAB — MICROALBUMIN / CREATININE URINE RATIO
Creatinine,U: 64.6 mg/dL
Microalb Creat Ratio: 1.7 mg/g (ref 0.0–30.0)
Microalb, Ur: 1.1 mg/dL (ref 0.0–1.9)

## 2021-11-29 LAB — URINE CULTURE
MICRO NUMBER:: 12774145
Result:: NO GROWTH
SPECIMEN QUALITY:: ADEQUATE

## 2021-11-29 LAB — SJOGRENS SYNDROME-B EXTRACTABLE NUCLEAR ANTIBODY: SSB (La) (ENA) Antibody, IgG: <1 AI

## 2021-11-29 LAB — SJOGRENS SYNDROME-A EXTRACTABLE NUCLEAR ANTIBODY: SSA (Ro) (ENA) Antibody, IgG: <1 AI

## 2021-11-29 LAB — TSH: TSH: 1.82 u[IU]/mL (ref 0.35–5.50)

## 2021-12-07 NOTE — Telephone Encounter (Signed)
error 

## 2021-12-14 ENCOUNTER — Ambulatory Visit: Payer: BC Managed Care – PPO | Admitting: Physical Therapy

## 2021-12-16 ENCOUNTER — Ambulatory Visit: Payer: BC Managed Care – PPO | Attending: Internal Medicine | Admitting: Physical Therapy

## 2021-12-16 ENCOUNTER — Encounter: Payer: BC Managed Care – PPO | Admitting: Obstetrics and Gynecology

## 2021-12-16 ENCOUNTER — Encounter: Payer: BC Managed Care – PPO | Admitting: Physical Therapy

## 2021-12-21 ENCOUNTER — Ambulatory Visit: Payer: BC Managed Care – PPO | Admitting: Physical Therapy

## 2021-12-23 ENCOUNTER — Other Ambulatory Visit: Payer: Self-pay | Admitting: Internal Medicine

## 2021-12-23 ENCOUNTER — Ambulatory Visit: Payer: BC Managed Care – PPO | Admitting: Physical Therapy

## 2021-12-27 ENCOUNTER — Telehealth: Payer: Self-pay | Admitting: Internal Medicine

## 2021-12-27 NOTE — Telephone Encounter (Signed)
Rejection Reason - Patient was No Show" [name] said on Dec 27, 2021 11:10 AM  "Pt no showed" [name] said on Dec 23, 2021 11:24 AM  Msg from [facility] orthopedic surgery

## 2021-12-28 ENCOUNTER — Encounter: Payer: BC Managed Care – PPO | Admitting: Physical Therapy

## 2021-12-30 ENCOUNTER — Encounter: Payer: BC Managed Care – PPO | Admitting: Physical Therapy

## 2022-01-04 ENCOUNTER — Encounter: Payer: BC Managed Care – PPO | Admitting: Physical Therapy

## 2022-01-06 ENCOUNTER — Encounter: Payer: BC Managed Care – PPO | Admitting: Physical Therapy

## 2022-01-07 ENCOUNTER — Other Ambulatory Visit: Payer: Self-pay | Admitting: Internal Medicine

## 2022-01-11 ENCOUNTER — Encounter: Payer: BC Managed Care – PPO | Admitting: Physical Therapy

## 2022-01-13 ENCOUNTER — Encounter: Payer: BC Managed Care – PPO | Admitting: Physical Therapy

## 2022-01-18 ENCOUNTER — Encounter: Payer: BC Managed Care – PPO | Admitting: Physical Therapy

## 2022-01-20 ENCOUNTER — Encounter: Payer: BC Managed Care – PPO | Admitting: Physical Therapy

## 2022-01-25 ENCOUNTER — Encounter: Payer: BC Managed Care – PPO | Admitting: Physical Therapy

## 2022-01-27 ENCOUNTER — Encounter: Payer: BC Managed Care – PPO | Admitting: Physical Therapy

## 2022-01-28 ENCOUNTER — Other Ambulatory Visit: Payer: Self-pay | Admitting: Internal Medicine

## 2022-02-01 ENCOUNTER — Encounter: Payer: BC Managed Care – PPO | Admitting: Physical Therapy

## 2022-02-03 ENCOUNTER — Encounter: Payer: BC Managed Care – PPO | Admitting: Physical Therapy

## 2022-02-03 ENCOUNTER — Ambulatory Visit: Payer: BC Managed Care – PPO | Admitting: Podiatry

## 2022-02-08 ENCOUNTER — Other Ambulatory Visit: Payer: Self-pay

## 2022-02-08 ENCOUNTER — Encounter: Payer: Self-pay | Admitting: Physical Therapy

## 2022-02-08 ENCOUNTER — Ambulatory Visit: Payer: BC Managed Care – PPO | Attending: Internal Medicine | Admitting: Physical Therapy

## 2022-02-08 DIAGNOSIS — M6281 Muscle weakness (generalized): Secondary | ICD-10-CM | POA: Diagnosis present

## 2022-02-08 DIAGNOSIS — Z7409 Other reduced mobility: Secondary | ICD-10-CM | POA: Diagnosis present

## 2022-02-08 DIAGNOSIS — G9332 Myalgic encephalomyelitis/chronic fatigue syndrome: Secondary | ICD-10-CM | POA: Insufficient documentation

## 2022-02-08 DIAGNOSIS — U099 Post covid-19 condition, unspecified: Secondary | ICD-10-CM | POA: Insufficient documentation

## 2022-02-08 NOTE — Therapy (Signed)
[facility] [address], [place], [address] Phone: [phone]   Fax:  [phone]  Physical Therapy Evaluation  Patient Details  Name: Maria Bentley MRN: [id number] Date of Birth: [birth year] No data recorded  Encounter Date: 02/08/2022   PT End of Session - 02/08/22 1201     Visit Number 1    Number of Visits 17    Date for PT Re-Evaluation 04/05/22    Authorization - Visit Number 1    Authorization - Number of Visits 17    PT Start Time 6948    PT Stop Time 5462    PT Time Calculation (min) 45 min    Equipment Utilized During Treatment Gait belt    Activity Tolerance Patient tolerated treatment well    Behavior During Therapy WFL for tasks assessed/performed             Past Medical History:  Diagnosis Date   Abscess of right buttock 04/30/2019   Anemia    Anxiety    COVID-19 virus infection    Depression 2010   Diabetes mellitus    DKA, type 2 ([place]) 10/13/2019   Dysrhythmia    Endometriosis    Headache, common migraine, intractable    History of kidney stones    History of ovarian cyst    left sided, caused by her endometriosis (possibly an endometrioma), in her 54s.   Hyperlipidemia    Hypertension    Kidney stones    left   Morbid obesity ([place])    s/p gastric bypass   Personal hx of gastric bypass 2010   Rosacea    Vertigo     Past Surgical History:  Procedure Laterality Date   CARPAL TUNNEL RELEASE Right 11/26/2020   Procedure: CARPAL TUNNEL RELEASE;  Surgeon: Hessie Knows, MD;  Location: [facility];  Service: Orthopedics;  Laterality: Right;   COLONOSCOPY WITH PROPOFOL N/A 10/21/2020   Procedure: COLONOSCOPY WITH PROPOFOL;  Surgeon: Virgel Manifold, MD;  Location: [facility];  Service: Endoscopy;  Laterality: N/A;   COLONOSCOPY WITH PROPOFOL N/A 09/15/2021   Procedure: COLONOSCOPY WITH PROPOFOL;  Surgeon: Virgel Manifold, MD;  Location: [facility];   Service: Endoscopy;  Laterality: N/A;   DILATION AND CURETTAGE OF UTERUS  2011   gastric bypass     OPEN REDUCTION INTERNAL FIXATION (ORIF) DISTAL RADIAL FRACTURE Right 11/26/2020   Procedure: Open reduction internal fixation, right distal radius;  Surgeon: Hessie Knows, MD;  Location: [facility];  Service: Orthopedics;  Laterality: Right;   OVARIAN CYST REMOVAL     ROTATOR CUFF REPAIR     right   SHOULDER ARTHROSCOPY WITH SUBACROMIAL DECOMPRESSION AND OPEN ROTATOR C Right 07/19/2020   Procedure: Right shoulder arthroscopic vs mini-open rotator cuff repair vs Regeneten patch application, subacomial dcompression, and biceps tenodesis;  Surgeon: Leim Fabry, MD;  Location: [facility];  Service: Orthopedics;  Laterality: Right;    There were no vitals filed for this visit.    Subjective Assessment - 02/08/22 0946     Subjective 51 y.o. female pnt presents with global deconditioning r/o covid long-haul complications    Pertinent History 51 y.o. female pnt presents with global deconditioning r/o covid long-haul complications. Pnt experiences balance issues secondary to her vertigo and muscular fatigue. Pnt had covid in November of 2020 and started using a cane to assist with her fatigue and balance.  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 Pnt lives at home with her husband and dog in a home with 3 steps and a single railing at the entry; however, she is alone during the day while her husband is at work. Aggravating factors include walking for over 3 min, standing for over 3 min, negotiating more than 5 steps, lifting her 10 pound dog, bending over, ambulating over soft surfaces, and closing her eyes while standing. Easing factors include leaning over objects, leaning on stair railings, holding onto the wall, sitting, and rest. Pnt goals include improving her stamina. Pnt has had 1 fall within the last 6 months and  denies weight change, b/b dysfunction, n/v, or fever. She says she experiences night pain that is likely due to her fibromyalgia.    Limitations Walking;Lifting;House hold activities;Standing    How long can you sit comfortably? unlimited    How long can you stand comfortably? 3 min    How long can you walk comfortably? 3 min    Patient Stated Goals increase stamina    Currently in Pain? No/denies             Posture  Upper crossed syndrome Lower crossed syndrome Rounded shoulders Forward head posture  Thoracic kyphosis   Gait Thoracic kyphosis maintained throughout  Decreased stride length  Increased double leg stance time   6 Minute Walk Test:  limited to 4.26 with a coverage of 832ft  Oxygen post 6MWT: 96% Pulse Rate post 6MWT: 87 bpm  Shoulder ROM/OP Abduction: R: 135 , L: approx 180 Flexion: bilaterally WFL  Shoulder MMT Abduction : 4+/5 bilaterally  Flexion:  4+/5 bilaterally  Hip MMT Flexion : 3/5 bilateral  ER 4-/5 bilateral  IR: R: 5/5 and L :4/5  Abduction: R : 4/5, L: 4+/5 Extension: L: 3+/5, R: 5/5  Parascapular MMT Scapular Retractors R: 4/5 L 4/5 Lower Traps (Ys) R 3- /5  L3-/5   5x Sit to Stand:16.63 sec   Sit to Stand Assessment  Gower's sign Aberrant head motion   Therex: Bilateral Shoulder ER with GTB 3 x 10 STS for endurance 3 x 12     Objective measurements completed on examination: See above findings.           PT Education - 02/08/22 1201     Education Details dx, prognosis, HEP    Person(s) Educated Patient    Methods Explanation;Demonstration;Verbal cues    Comprehension Verbalized understanding;Returned demonstration              PT Short Term Goals - 02/08/22 1142       PT SHORT TERM GOAL #1   Title Pnt will be independent with HEP in order to progress at home in order to complete functional ADLs.    Baseline 02/08/22 HEP given    Time 4    Period Weeks    Status New    Target Date 03/08/22                PT Long Term Goals - 02/08/22 1143       PT LONG TERM GOAL #1   Title Pnt will be able to lift >10 pounds in order to safely pick up her dog Bella without risk of dropping.    Baseline 02/08/22 pnt can lift less than 10lb    Time 8    Period Weeks    Status New    Target Date 04/05/22      PT LONG TERM GOAL #2  Title Pnt will complete full 6MWT without taking a break in order to show improvement in cardiovascular and LE endurance to be able to walk her dog Bella for exercise. Once able to walk full 6 minutes, will increase distance by 171ft for clinically significant increase in walking endurance.    Baseline 02/08/22 835ft & limited to 4 min 26 sec due to fatigue.    Time 8    Period Weeks    Status New    Target Date 04/05/22      PT LONG TERM GOAL #3   Title Pnt will decrease 5xSTS time by 2.3 seconds  in order to show clinically significant increase in LE strength to decrease fall risk in community and at home.    Baseline 02/08/22 16.63 sec    Time 8    Period Weeks    Status New    Target Date 04/05/22      PT LONG TERM GOAL #4   Title Pnt will increase lower trap (Ys) MMT to 4/5 bilaterally in order to maintain her posture while at her desk taking her online classes during the day.    Baseline 02/08/22 Lower Traps (Ys) 3- /5 bilaterally    Time 8    Period Weeks    Status New    Target Date 04/05/22                    Plan - 02/08/22 1202     Clinical Impression Statement Pnt presented with global deconditioning and balance impairments. Deficits include standing/static balance, posture, global fatigue, bilateral hip flexion MMT, left hip extension MMT, and parascapular MMT. The deficits decrease pnt ability to complete functional ADLs such as standing, walking, bending, lift/squat, negotiating stairs, and negotiating uneven terrain. The listed deficits decrease pnt participation in walking her dog, showering independently, lifting her dog, walking in  the grocery store, and laundry. Pnt verbalized and demonstrated understanding of PT directions and has good rehab potential. Further strength training will be required to address listed deficits to achieve increased standing/dynamic balance, endurance , and MMT.    Personal Factors and Comorbidities Comorbidity 3+;Fitness;Time since onset of injury/illness/exacerbation;Past/Current Experience    Comorbidities covid long haul, hypertension, diabetes    Examination-Activity Limitations Bathing;Stairs;Dressing;Stand;Bend;Hygiene/Grooming;Caring for Others;Lift;Transfers;Carry;Locomotion Level;Squat    Examination-Participation Restrictions Volunteer;Cleaning;Interpersonal Relationship;Yard Work;School;Laundry;Community Activity;Shop    Stability/Clinical Decision Making Evolving/Moderate complexity    Clinical Decision Making Moderate    Rehab Potential Good    PT Frequency 2x / week    PT Duration 8 weeks    PT Treatment/Interventions ADLs/Self Care Home Management;Aquatic Therapy;Biofeedback;Electrical Stimulation;Cryotherapy;Iontophoresis 4mg /ml Dexamethasone;Moist Heat;Traction;Ultrasound;Parrafin;Contrast Bath;DME Instruction;Gait training;Stair training;Functional mobility training;Therapeutic activities;Therapeutic exercise;Balance training;Neuromuscular re-education;Patient/family education;Orthotic Fit/Training;Wheelchair mobility training;Manual techniques;Dry needling;Energy conservation;Splinting;Taping;Vestibular;Spinal Manipulations;Joint Manipulations    PT Next Visit Plan FGA and CTSIB    PT Home Exercise Plan bilateral shoulder ER with GTB, STS    Consulted and Agree with Plan of Care Patient             Patient will benefit from skilled therapeutic intervention in order to improve the following deficits and impairments:  Abnormal gait, Decreased activity tolerance, Decreased endurance, Decreased strength, Improper body mechanics, Cardiopulmonary status limiting activity, Decreased  balance, Decreased coordination, Difficulty walking, Postural dysfunction  Visit Diagnosis: Muscle weakness (generalized)  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     Problem List Patient Active Problem List   Diagnosis Date Noted   Radiculitis, cervical 11/28/2021   COVID-19 long hauler manifesting chronic  fatigue 11/28/2021   History of UTI 11/28/2021   Polyp of transverse colon    COVID-19 long hauler manifesting chronic concentration deficit 07/09/2021   Encounter for screening colonoscopy    Diarrhea 09/04/2020   Anemia in chronic kidney disease 08/25/2020   Hematuria 08/25/2020   Stage 3a chronic kidney disease ([place]) 08/25/2020   Analgesic overuse headache 08/24/2020   Atypical facial pain 08/24/2020   Bruxism (teeth grinding) 08/24/2020   Cervical syndrome 08/24/2020   Disorder of autonomic nervous system 08/24/2020   Elevated blood-pressure reading without diagnosis of hypertension 08/24/2020   Fibromyositis 08/24/2020   Temporomandibular joint disorder 08/24/2020   Hypoglycemic reaction 08/21/2020   Coronary atherosclerosis due to calcified coronary lesion 08/21/2020   Renal mass, left 07/06/2020   Chronic right shoulder pain 02/09/2020   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 disease ([place]) 01/26/2020   Recurrent headache 01/13/2020   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 12/30/2019   Elevated alkaline phosphatase level 11/21/2019   Hospital discharge follow-up 11/04/2019   Cervical spondylosis without myelopathy 10/29/2019   Pressure injury of skin 10/16/2019   History of gastric bypass 10/13/2019   COVID-19 virus infection 10/06/2019   Preoperative evaluation of a medical condition to rule out surgical contraindications (TAR required) 09/21/2019   Morbid obesity with BMI of 40.0-44.9, adult ([place]) 06/24/2019   History of bariatric surgery 04/15/2019   Cervicocranial syndrome 11/12/2018   Depressive  disorder 11/12/2018   Fibromyalgia 06/05/2018   Iron deficiency anemia 06/05/2018   Enteritis, enteropathogenic E. 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, [place], [address] Phone: [phone]   Fax:  [phone]  Name: Elaiza Shoberg MRN: [id number] Date of Birth: September 25, 1971

## 2022-02-13 ENCOUNTER — Ambulatory Visit: Payer: BC Managed Care – PPO | Admitting: Physical Therapy

## 2022-02-16 ENCOUNTER — Encounter: Payer: BC Managed Care – PPO | Admitting: Physical Therapy

## 2022-02-17 ENCOUNTER — Ambulatory Visit: Payer: BC Managed Care – PPO | Admitting: Physical Therapy

## 2022-02-20 ENCOUNTER — Ambulatory Visit: Payer: BC Managed Care – PPO | Admitting: Physical Therapy

## 2022-02-20 NOTE — Patient Instructions (Incomplete)
FGA and CTSIB  ? ?Overall deconditioning  ?Walk - 4 minutes  ?

## 2022-02-23 ENCOUNTER — Ambulatory Visit: Payer: BC Managed Care – PPO | Admitting: Physical Therapy

## 2022-02-27 ENCOUNTER — Telehealth: Payer: Self-pay

## 2022-02-27 ENCOUNTER — Ambulatory Visit: Payer: BC Managed Care – PPO | Admitting: Physical Therapy

## 2022-02-27 NOTE — Telephone Encounter (Signed)
Pt did not show for scheduled appointment this date, nor was clinic contacted. Author reached out to make pt aware of next visit and inquire of absence. This is pt's 2nd no-show. Author LVM, encouraged pt to contact clinic when able. ? ?4:46 PM, 02/27/22 ?Etta Grandchild, PT, DPT ?Physical Therapist - [place] ?[phone] (Office) ? ?

## 2022-03-02 ENCOUNTER — Telehealth: Payer: Self-pay

## 2022-03-02 ENCOUNTER — Ambulatory Visit: Payer: BC Managed Care – PPO | Admitting: Physical Therapy

## 2022-03-02 NOTE — Telephone Encounter (Signed)
Pt did not show for scheduled PT appointment this date. Author reached out to number in system, left a HIPAA compliant voicemail. This is patient's 2nd consecutive no-show, third overall. Pt encouraged to contact our office to determine pt's goals and make a plan.  ? ?11:50 AM, 03/02/22 ?Etta Grandchild, PT, DPT ?Physical Therapist - [place] ?[phone] (Office) ? ?

## 2022-03-06 ENCOUNTER — Encounter: Payer: BC Managed Care – PPO | Admitting: Physical Therapy

## 2022-03-06 ENCOUNTER — Ambulatory Visit: Payer: BC Managed Care – PPO | Admitting: Physical Therapy

## 2022-03-09 ENCOUNTER — Ambulatory Visit: Payer: BC Managed Care – PPO | Admitting: Physical Therapy

## 2022-03-09 ENCOUNTER — Encounter: Payer: BC Managed Care – PPO | Admitting: Physical Therapy

## 2022-03-12 ENCOUNTER — Other Ambulatory Visit: Payer: Self-pay | Admitting: Internal Medicine

## 2022-03-13 ENCOUNTER — Encounter: Payer: BC Managed Care – PPO | Admitting: Physical Therapy

## 2022-03-16 ENCOUNTER — Encounter: Payer: BC Managed Care – PPO | Admitting: Physical Therapy

## 2022-03-20 ENCOUNTER — Encounter: Payer: BC Managed Care – PPO | Admitting: Physical Therapy

## 2022-03-22 ENCOUNTER — Encounter: Payer: BC Managed Care – PPO | Admitting: Physical Therapy

## 2022-03-22 ENCOUNTER — Telehealth: Payer: Self-pay

## 2022-03-22 ENCOUNTER — Ambulatory Visit: Payer: BC Managed Care – PPO | Attending: Internal Medicine | Admitting: Physical Therapy

## 2022-03-22 NOTE — Telephone Encounter (Signed)
Pt did not show for schedule appointment this date. No voicemail received. Author reached out to patient via mobile number listed in system, LVM hipaa compliant. This marks 3rd consecutive no-show, 4th overall. Per facility policy pt will be DC from out services due to not returning since initial evaluation and 2 or more consecutive no-shows.  ? ?3:25 PM, 03/22/22 ?Etta Grandchild, PT, DPT ?Physical Therapist - [place] ?[phone] (Office) ? ? ?

## 2022-04-26 ENCOUNTER — Other Ambulatory Visit: Payer: Self-pay | Admitting: Internal Medicine

## 2022-05-05 ENCOUNTER — Ambulatory Visit: Payer: BC Managed Care – PPO

## 2022-05-06 ENCOUNTER — Ambulatory Visit
Admission: RE | Admit: 2022-05-06 | Discharge: 2022-05-06 | Disposition: A | Discharge status: Home / Self Care | Payer: BC Managed Care – PPO | Source: Ambulatory Visit | Attending: Family Medicine | Admitting: Family Medicine

## 2022-05-06 ENCOUNTER — Encounter: Payer: Self-pay | Admitting: Oncology

## 2022-05-06 VITALS — BP 133/95 | HR 76 | Temp 97.9°F | Resp 16

## 2022-05-06 DIAGNOSIS — Z87440 Personal history of urinary (tract) infections: Secondary | ICD-10-CM | POA: Insufficient documentation

## 2022-05-06 DIAGNOSIS — N390 Urinary tract infection, site not specified: Secondary | ICD-10-CM | POA: Diagnosis present

## 2022-05-06 LAB — POCT URINALYSIS DIP (MANUAL ENTRY)
Bilirubin, UA: NEGATIVE
Glucose, UA: NEGATIVE mg/dL
Ketones, POC UA: NEGATIVE mg/dL
Nitrite, UA: POSITIVE — AB
Protein Ur, POC: 100 mg/dL — AB
Spec Grav, UA: 1.02 (ref 1.010–1.025)
Urobilinogen, UA: 1 E.U./dL
pH, UA: 5 (ref 5.0–8.0)

## 2022-05-06 MED ORDER — CIPROFLOXACIN HCL 500 MG PO TABS: 500.0000 mg | ORAL_TABLET | Freq: Two times a day (BID) | ORAL | 0 refills | Status: DC

## 2022-05-06 MED ORDER — PHENAZOPYRIDINE HCL 200 MG PO TABS: 200.0000 mg | ORAL_TABLET | Freq: Three times a day (TID) | ORAL | 0 refills | Status: DC | PRN

## 2022-05-06 NOTE — Discharge Instructions (Signed)
Hydrate well with fluids.  If any red flag symptoms such as fever, severe abdominal pain, vomiting, or blood sugars in excess of 300 develop while being treated for UTI go immediately to the emergency department as these are signs of urosepsis.  Otherwise complete entire course of medication and follow-up here as needed.

## 2022-05-06 NOTE — ED Triage Notes (Signed)
Pt presents with dysuria, urinary frequency and lower abdominal pain x 3 days.  

## 2022-05-06 NOTE — ED Provider Notes (Addendum)
Roderic Palau    CSN: [id number] Arrival date & time: 05/06/22  0848      History   Chief Complaint Chief Complaint  Patient presents with   Urinary Frequency    Entered by patient   Dysuria    HPI Maria Bentley is a 51 y.o. female.   HPI Maria Bentley is a 51 y.o. female with a history of UTI and Type 2 diabetes, presents for evaluation of urinary frequency, urgency and dysuria x  3 days, without flank pain, fever, or chills. Previous history of UTI with treatment successful with Ciprofloxacin. Patient's last menstrual period was 05/09/2016 (lmp unknown). Per patient home blood sugars less than 150. Recently had several steriod injections and suspects this triggered UTI as sugars increased to 200's following steroid injections.  Past Medical History:  Diagnosis Date   Abscess of right buttock 04/30/2019   Anemia    Anxiety    COVID-19 virus infection    Depression 2010   Diabetes mellitus    DKA, type 2 ([place]) 10/13/2019   Dysrhythmia    Endometriosis    Headache, common migraine, intractable    History of kidney stones    History of ovarian cyst    left sided, caused by her endometriosis (possibly an endometrioma), in her 2s.   Hyperlipidemia    Hypertension    Kidney stones    left   Morbid obesity ([place])    s/p gastric bypass   Personal hx of gastric bypass 2010   Rosacea    Vertigo     Patient Active Problem List   Diagnosis Date Noted   Radiculitis, cervical 11/28/2021   COVID-19 long hauler manifesting chronic fatigue 11/28/2021   History of UTI 11/28/2021   Polyp of transverse colon    COVID-19 long hauler manifesting chronic concentration deficit 07/09/2021   Encounter for screening colonoscopy    Diarrhea 09/04/2020   Anemia in chronic kidney disease 08/25/2020   Hematuria 08/25/2020   Stage 3a chronic kidney disease ([place]) 08/25/2020   Analgesic overuse headache 08/24/2020   Atypical facial pain 08/24/2020   Bruxism (teeth  grinding) 08/24/2020   Cervical syndrome 08/24/2020   Disorder of autonomic nervous system 08/24/2020   Elevated blood-pressure reading without diagnosis of hypertension 08/24/2020   Fibromyositis 08/24/2020   Temporomandibular joint disorder 08/24/2020   Hypoglycemic reaction 08/21/2020   Coronary atherosclerosis due to calcified coronary lesion 08/21/2020   Renal mass, left 07/06/2020   Chronic right shoulder pain 02/09/2020   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 disease ([place]) 01/26/2020   Recurrent headache 01/13/2020   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 12/30/2019   Elevated alkaline phosphatase level 11/21/2019   Hospital discharge follow-up 11/04/2019   Cervical spondylosis without myelopathy 10/29/2019   Pressure injury of skin 10/16/2019   History of gastric bypass 10/13/2019   COVID-19 virus infection 10/06/2019   Preoperative evaluation of a medical condition to rule out surgical contraindications (TAR required) 09/21/2019   Morbid obesity with BMI of 40.0-44.9, adult ([place]) 06/24/2019   History of bariatric surgery 04/15/2019   Cervicocranial syndrome 11/12/2018   Depressive disorder 11/12/2018   Fibromyalgia 06/05/2018   Iron deficiency anemia 06/05/2018   Enteritis, enteropathogenic E. 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   Morbid obesity ([place]) 06/09/2012   Anxiety    Headache, common migraine, intractable    Hyperlipidemia    Hypothyroid    OSA (obstructive sleep apnea) 02/05/2012    Past Surgical History:  Procedure Laterality Date   CARPAL TUNNEL  RELEASE Right 11/26/2020   Procedure: CARPAL TUNNEL RELEASE;  Surgeon: Hessie Knows, MD;  Location: [facility];  Service: Orthopedics;  Laterality: Right;   COLONOSCOPY WITH PROPOFOL N/A 10/21/2020   Procedure: COLONOSCOPY WITH PROPOFOL;  Surgeon: Virgel Manifold, MD;  Location: [facility];  Service: Endoscopy;  Laterality: N/A;   COLONOSCOPY WITH PROPOFOL N/A 09/15/2021   Procedure: COLONOSCOPY WITH PROPOFOL;  Surgeon: Virgel Manifold, MD;  Location: [facility];  Service: Endoscopy;  Laterality: N/A;   DILATION AND CURETTAGE OF UTERUS  2011   gastric bypass     OPEN REDUCTION INTERNAL FIXATION (ORIF) DISTAL RADIAL FRACTURE Right 11/26/2020   Procedure: Open reduction internal fixation, right distal radius;  Surgeon: Hessie Knows, MD;  Location: [facility];  Service: Orthopedics;  Laterality: Right;   OVARIAN CYST REMOVAL     ROTATOR CUFF REPAIR     right   SHOULDER ARTHROSCOPY WITH SUBACROMIAL DECOMPRESSION AND OPEN ROTATOR C Right 07/19/2020   Procedure: Right shoulder arthroscopic vs mini-open rotator cuff repair vs Regeneten patch application, subacomial dcompression, and biceps tenodesis;  Surgeon: Leim Fabry, MD;  Location: [facility];  Service: Orthopedics;  Laterality: Right;    OB History     Gravida  0   Para  0   Term  0   Preterm  0   AB  0   Living  0      SAB  0   IAB  0   Ectopic  0   Multiple  0   Live Births  0            Home Medications    Prior to Admission medications   Medication Sig Start Date End Date Taking? Authorizing Provider  ciprofloxacin (CIPRO) 500 MG tablet Take 1 tablet (500 mg total) by mouth 2 (two) times daily. 05/06/22  Yes Scot Jun, FNP  phenazopyridine (PYRIDIUM) 200 MG tablet Take 1 tablet (200 mg total) by mouth 3 (three) times daily as needed for pain. 05/06/22  Yes Scot Jun, FNP  albuterol (VENTOLIN HFA) 108 (90 Base) MCG/ACT inhaler Inhale 2 puffs into the lungs every 6 (six) hours as  needed for wheezing or shortness of breath. 10/02/19   Terald Sleeper, PA-C  amitriptyline (ELAVIL) 25 MG tablet TAKE 1 TABLET BY MOUTH EVERYDAY AT BEDTIME 03/13/22   [name], MD  aspirin-acetaminophen-caffeine (EXCEDRIN MIGRAINE) [phone] MG tablet Take 2 tablets by mouth every 6 (six) hours as needed for headache.    [provider]  atorvastatin (LIPITOR) 20 MG tablet TAKE 1 TABLET BY MOUTH EVERY DAY 01/30/22   [name], MD  Azelastine HCl 137 MCG/SPRAY SOLN SMARTSIG:1-2 Spray(s) Both Nares Twice Daily 11/22/21   [provider]  buPROPion (WELLBUTRIN XL) 150 MG 24 hr tablet Take 450 mg by mouth daily.  05/12/18   [provider]  CLINPRO 5000 1.1 % PSTE Place 1 application onto teeth at bedtime.  06/15/20   [provider]  Continuous Blood Gluc Receiver (FREESTYLE LIBRE 14 DAY READER) DEVI 1 applicator  by Does not apply route 4 (four) times daily. 11/24/17   [name], MD  Continuous Blood Gluc Sensor (FREESTYLE LIBRE 14 DAY SENSOR) MISC USE TO CHECK BLOOD SUGAR FOUR TIMES A DAY AS NEEDED 12/23/21   [name], MD  cyanocobalamin (,VITAMIN B-12,) 1000 MCG/ML injection INJECT 1 ML (1,000 MCG TOTAL) INTO THE MUSCLE ONCE A WEEK. FOR 4 WEEKS, THEN MONTHLY THEREAFTER 11/23/21   [name], MD  cyclobenzaprine (FLEXERIL) 10 MG tablet Take 0.5-1 tablets (5-10 mg total) by mouth 3 (three) times daily as needed for muscle spasms. 07/07/21   [name], MD  diphenhydrAMINE (BENADRYL) 25 MG tablet Take 25 mg by mouth every 6 (six) hours as needed for allergies.    [provider]  EPINEPHrine 0.3 mg/0.3 mL IJ SOAJ injection Inject 0.3 mg into the muscle as needed for anaphylaxis.  06/25/10   [provider]  escitalopram (LEXAPRO) 20 MG tablet Take 20 mg by mouth daily.  02/27/17   [provider]  fluticasone (FLONASE) 50 MCG/ACT nasal spray SPRAY 2 SPRAYS INTO EACH NOSTRIL EVERY DAY 04/21/21   [name], MD   furosemide (LASIX) 20 MG tablet TAKE 1 TABLET (20 MG TOTAL) BY MOUTH DAILY. AS NEEDED FOR FLUID RETENTION 02/01/21   [name], MD  HORIZANT 600 MG TBCR TAKE 1 TABLET BY MOUTH TWICE A DAY 01/09/22   [name], MD  Insulin Glargine [facility]) 100 UNIT/ML INJECT 33 UNITS INTO THE SKIN AT BEDTIME. 03/13/22   [name], MD  Insulin Pen Needle (BD PEN NEEDLE NANO U/F) 32G X 4 MM MISC Please specify directions, refills and quantity 08/22/18   [name], MD  lamoTRIgine (LAMICTAL) 150 MG tablet Take 150 mg by mouth daily.    [provider]  levothyroxine (SYNTHROID) 50 MCG tablet TAKE 1 TABLET BY MOUTH EVERY DAY 04/26/22   [name], MD  lipase/protease/amylase (CREON) 36000 UNITS CPEP capsule TAKE 2 CAPSULES BY MOUTH 3 (THREE) TIMES DAILY BEFORE MEALS AND 1 CAPSULE WITH SNACKS. 10/06/21   Virgel Manifold, MD  LORazepam (ATIVAN) 0.5 MG tablet Take 0.5-1 mg by mouth daily as needed for anxiety.     [provider]  losartan (COZAAR) 25 MG tablet TAKE 1 TABLET (25 MG TOTAL) BY MOUTH DAILY. 04/26/22 04/26/23  [name], MD  meclizine (ANTIVERT) 25 MG tablet Take 1 tablet (25 mg total) by mouth 3 (three) times daily as needed for dizziness. 06/16/15   [name], MD  Melatonin 10 MG TABS Take 10 mg by mouth at bedtime as needed (sleep).     [provider]  metoprolol tartrate (LOPRESSOR) 25 MG tablet Take 1 tablet (25 mg total) by mouth 2 (two) times daily. 11/28/21   [name], MD  NOVOLOG FLEXPEN 100 UNIT/ML FlexPen INJECT 15 UNITS SUBCUTANEOUSLY IN THE MORNING, 20 UNITS AT LUNCH AND 5-10 UNITS WITH DINNER 03/13/22   [name], MD  NURTEC 75 MG TBDP Take 1 tablet by mouth daily as needed. 11/25/21   [provider]  nystatin (MYCOSTATIN/NYSTOP) powder APPLY 1 APPLICATION TO AFFECTED AREA TWICE A DAY 12/23/20   Leone Haven, MD  OnabotulinumtoxinA (BOTOX IJ) Inject as directed every 3 (three) months. For migraine     [provider]  ondansetron (ZOFRAN ODT) 4 MG disintegrating tablet Take 1 tablet (4 mg total) by mouth every 8 (eight) hours as needed for nausea or vomiting. 10/03/21  Blake Divine, MD  Rimegepant Sulfate (NURTEC PO) Take by mouth as needed.    [provider]  SYRINGE-NEEDLE, DISP, 3 ML (BD INTEGRA SYRINGE) 25G X 1" 3 ML MISC FOR USE WITH B12 INJECTIONS WEEKLY/MONTHLY 08/01/18   [name], MD    Family History Family History  Problem Relation Age of Onset   Diabetes Mother    Hypertension Mother    Hypertension Father    Diabetes Father    Depression Other        strong hx of   Breast cancer Paternal Grandmother 44   Breast cancer Maternal Grandmother    Cancer Neg Hx     Social History Social History   Tobacco Use   Smoking status: Former    Packs/day: 1.00    Years: 4.00    Pack years: 4.00    Types: Cigarettes    Quit date: 02/05/1996    Years since quitting: 26.2   Smokeless tobacco: Never  Vaping Use   Vaping Use: Never used  Substance Use Topics   Alcohol use: No   Drug use: No     Allergies   Amoxicillin, Avocado, Clarithromycin, Clarithromycin, Honey, Other, Sulfa antibiotics, Sulfa drugs cross reactors, Watermelon concentrate, Insulin detemir, Tape, and Victoza [liraglutide]   Review of Systems Review of Systems Pertinent negatives listed in HPI   Physical Exam Triage Vital Signs ED Triage Vitals [05/06/22 0905]  Enc Vitals Group     BP (!) 133/95     Pulse Rate 76     Resp 16     Temp 97.9 F (36.6 C)     Temp Source Oral     SpO2 97 %     Weight      Height      Head Circumference      Peak Flow      Pain Score 5     Pain Loc      Pain Edu?      Excl. in [place]?    No data found.  Updated Vital Signs BP (!) 133/95 (BP Location: Left Arm)   Pulse 76   Temp 97.9 F (36.6 C) (Oral)   Resp 16   LMP 05/09/2016 (LMP Unknown) Comment: D/C  SpO2 97%   Visual Acuity Right Eye Distance:   Left Eye Distance:    Bilateral Distance:    Right Eye Near:   Left Eye Near:    Bilateral Near:     Physical Exam General appearance: alert, well developed, well nourished, cooperative and in no distress Head: Normocephalic, without obvious abnormality, atraumatic Respiratory: Respirations even and unlabored, normal respiratory rate Heart: rate and rhythm normal.   CVA: Mild tenderness elicited with palpation bilaterally  Extremities: No gross deformities Skin: Skin color, texture, turgor normal. No rashes seen  Psych: Appropriate mood and affect.   UC Treatments / Results  Labs (all labs ordered are listed, but only abnormal results are displayed) Labs Reviewed  POCT URINALYSIS DIP (MANUAL ENTRY) - Abnormal; Notable for the following components:      Result Value   Color, UA orange (*)    Clarity, UA cloudy (*)    Blood, UA small (*)    Protein Ur, POC =100 (*)    Nitrite, UA Positive (*)    Leukocytes, UA Large (3+) (*)    All other components within normal limits  URINE CULTURE    EKG   Radiology No results found.  Procedures Procedures (including critical care time)  Medications Ordered in UC Medications - No data to display  Initial Impression / Assessment and Plan / UC Course  I have reviewed the triage vital signs and the nursing notes.  Pertinent labs & imaging results that were available during my care of the patient were reviewed by me and considered in my medical decision making (see chart for details).    Acute UTI, based on prior urine culture (10/03/21) treating with Ciprofloxacin 500 mg BID x 7 days.  Pyridium prescribed for as needed management of dysuria.  Encouraged to hydrate well with fluids.  Strict precautions given to go immediately to the ER if any symptoms of urosepsis develop.  recautions given to Return here as needed.   Final Clinical Impressions(s) / UC Diagnoses   Final diagnoses:  History of UTI  Acute UTI     Discharge Instructions       Hydrate well with fluids.  If any red flag symptoms such as fever, severe abdominal pain, vomiting, or blood sugars in excess of 300 develop while being treated for UTI go immediately to the emergency department as these are signs of urosepsis.  Otherwise complete entire course of medication and follow-up here as needed.     ED Prescriptions     Medication Sig Dispense Auth. Provider   ciprofloxacin (CIPRO) 500 MG tablet Take 1 tablet (500 mg total) by mouth 2 (two) times daily. 14 tablet Scot Jun, FNP   phenazopyridine (PYRIDIUM) 200 MG tablet Take 1 tablet (200 mg total) by mouth 3 (three) times daily as needed for pain. 10 tablet Scot Jun, FNP      PDMP not reviewed this encounter.   Scot Jun, FNP 05/06/22 [place], [place], [place] 05/06/22 [phone]

## 2022-05-09 LAB — URINE CULTURE: Culture: >=100000 — AB

## 2022-05-10 ENCOUNTER — Other Ambulatory Visit: Payer: Self-pay

## 2022-05-10 ENCOUNTER — Other Ambulatory Visit: Payer: Self-pay | Admitting: Internal Medicine

## 2022-05-10 MED ORDER — NOVOLOG FLEXPEN 100 UNIT/ML ~~LOC~~ SOPN: 20.0000 [IU] | PEN_INJECTOR | Freq: Three times a day (TID) | SUBCUTANEOUS | 0 refills | Status: DC

## 2022-05-10 MED ORDER — PANCRELIPASE (LIP-PROT-AMYL) 36000-114000 UNITS PO CPEP: ORAL_CAPSULE | ORAL | 3 refills | Status: DC

## 2022-05-10 NOTE — Telephone Encounter (Signed)
Last office visit 10/07/20 pancreatic insuffiencey  Last refill 10/06/21 3 refills  No appointment is schedule  Was a Dr T patient

## 2022-05-10 NOTE — Telephone Encounter (Signed)
Medication has been refilled and a follow up appt has been scheduled.

## 2022-05-26 ENCOUNTER — Telehealth: Payer: Self-pay | Admitting: Internal Medicine

## 2022-05-26 DIAGNOSIS — E1122 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 disease: Secondary | ICD-10-CM

## 2022-05-26 DIAGNOSIS — E039 Hypothyroidism, unspecified: Secondary | ICD-10-CM

## 2022-05-26 DIAGNOSIS — N1831 Chronic kidney disease, stage 3a: Secondary | ICD-10-CM

## 2022-05-26 NOTE — Telephone Encounter (Signed)
Patient has a lab appt 06/01/22, there are no orders in. 

## 2022-05-26 NOTE — Addendum Note (Signed)
Addended by: [name] on: 05/26/2022 03:08 PM   Modules accepted: Orders

## 2022-05-31 ENCOUNTER — Telehealth: Payer: Self-pay | Admitting: Internal Medicine

## 2022-05-31 MED ORDER — BD PEN NEEDLE NANO U/F 32G X 4 MM MISC
11 refills | Status: AC
Start: 2022-05-31 — End: ?

## 2022-05-31 NOTE — Telephone Encounter (Signed)
Refilled

## 2022-05-31 NOTE — Telephone Encounter (Signed)
Pt called stating she is out of her novolog needles and need a refill sent to [facility]

## 2022-06-01 ENCOUNTER — Other Ambulatory Visit: Payer: BC Managed Care – PPO

## 2022-06-02 ENCOUNTER — Other Ambulatory Visit (INDEPENDENT_AMBULATORY_CARE_PROVIDER_SITE_OTHER): Payer: BC Managed Care – PPO

## 2022-06-02 DIAGNOSIS — E039 Hypothyroidism, unspecified: Secondary | ICD-10-CM | POA: Diagnosis not present

## 2022-06-02 DIAGNOSIS — N182 Chronic kidney disease, stage 2 (mild): Secondary | ICD-10-CM

## 2022-06-02 DIAGNOSIS — E1122 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 disease: Secondary | ICD-10-CM | POA: Diagnosis not present

## 2022-06-02 LAB — LIPID PANEL
Cholesterol: 168 mg/dL (ref 0–200)
HDL: 52.7 mg/dL (ref >39.00)
LDL Cholesterol: 87 mg/dL (ref 0–99)
NonHDL: 115.19
Total CHOL/HDL Ratio: 3
Triglycerides: 141 mg/dL (ref 0.0–149.0)
VLDL: 28.2 mg/dL (ref 0.0–40.0)

## 2022-06-02 LAB — COMPREHENSIVE METABOLIC PANEL
ALT: 12 U/L (ref 0–35)
AST: 9 U/L (ref 0–37)
Albumin: 3.3 g/dL — ABNORMAL LOW (ref 3.5–5.2)
Alkaline Phosphatase: 98 U/L (ref 39–117)
BUN: 16 mg/dL (ref 6–23)
CO2: 27 mEq/L (ref 19–32)
Calcium: 8.9 mg/dL (ref 8.4–10.5)
Chloride: 106 mEq/L (ref 96–112)
Creatinine, Ser: 1.14 mg/dL (ref 0.40–1.20)
GFR: 55.59 mL/min — ABNORMAL LOW (ref >60.00)
Glucose, Bld: 74 mg/dL (ref 70–99)
Potassium: 4.3 mEq/L (ref 3.5–5.1)
Sodium: 142 mEq/L (ref 135–145)
Total Bilirubin: 0.3 mg/dL (ref 0.2–1.2)
Total Protein: 5.9 g/dL — ABNORMAL LOW (ref 6.0–8.3)

## 2022-06-02 LAB — TSH: TSH: 3.97 u[IU]/mL (ref 0.35–5.50)

## 2022-06-02 LAB — HEMOGLOBIN A1C: Hgb A1c MFr Bld: 10.5 % — ABNORMAL HIGH (ref 4.6–6.5)

## 2022-06-05 ENCOUNTER — Encounter: Payer: Self-pay | Admitting: Internal Medicine

## 2022-06-05 ENCOUNTER — Ambulatory Visit: Payer: BC Managed Care – PPO | Admitting: Internal Medicine

## 2022-06-05 VITALS — BP 138/84 | HR 103 | Temp 98.5°F | Ht 64.0 in | Wt 221.2 lb

## 2022-06-05 DIAGNOSIS — K8689 Other specified diseases of pancreas: Secondary | ICD-10-CM

## 2022-06-05 DIAGNOSIS — G9332 Myalgic encephalomyelitis/chronic fatigue syndrome: Secondary | ICD-10-CM

## 2022-06-05 DIAGNOSIS — E7800 Pure hypercholesterolemia, unspecified: Secondary | ICD-10-CM

## 2022-06-05 DIAGNOSIS — E1122 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 disease: Secondary | ICD-10-CM | POA: Diagnosis not present

## 2022-06-05 DIAGNOSIS — N1831 Chronic kidney disease, stage 3a: Secondary | ICD-10-CM

## 2022-06-05 DIAGNOSIS — I10 Essential (primary) hypertension: Secondary | ICD-10-CM | POA: Diagnosis not present

## 2022-06-05 DIAGNOSIS — N182 Chronic kidney disease, stage 2 (mild): Secondary | ICD-10-CM

## 2022-06-05 DIAGNOSIS — E161 Other hypoglycemia: Secondary | ICD-10-CM

## 2022-06-05 DIAGNOSIS — U099 Post covid-19 condition, unspecified: Secondary | ICD-10-CM

## 2022-06-05 DIAGNOSIS — R2689 Other abnormalities of gait and mobility: Secondary | ICD-10-CM

## 2022-06-05 MED ORDER — LOSARTAN POTASSIUM 25 MG PO TABS: 25.0000 mg | ORAL_TABLET | Freq: Every day | ORAL | 1 refills | Status: DC

## 2022-06-05 MED ORDER — ATORVASTATIN CALCIUM 20 MG PO TABS
20.0000 mg | ORAL_TABLET | Freq: Every day | ORAL | 1 refills | Status: DC
Start: 2022-06-05 — End: 2023-01-09

## 2022-06-06 ENCOUNTER — Other Ambulatory Visit: Payer: Self-pay | Admitting: Internal Medicine

## 2022-06-06 NOTE — Assessment & Plan Note (Addendum)
Her diarrhea has Improved with use of pancreatic enzymes/

## 2022-06-07 ENCOUNTER — Other Ambulatory Visit: Payer: Self-pay | Admitting: Internal Medicine

## 2022-06-19 ENCOUNTER — Other Ambulatory Visit: Payer: Self-pay | Admitting: Internal Medicine

## 2022-08-20 ENCOUNTER — Other Ambulatory Visit: Payer: Self-pay | Admitting: Internal Medicine

## 2022-08-31 ENCOUNTER — Other Ambulatory Visit: Payer: Self-pay | Admitting: Internal Medicine

## 2022-09-05 ENCOUNTER — Other Ambulatory Visit: Payer: Self-pay | Admitting: Internal Medicine

## 2022-09-13 ENCOUNTER — Other Ambulatory Visit: Payer: Self-pay | Admitting: Internal Medicine

## 2022-09-14 DIAGNOSIS — R4189 Other symptoms and signs involving cognitive functions and awareness: Secondary | ICD-10-CM | POA: Insufficient documentation

## 2022-10-15 ENCOUNTER — Ambulatory Visit
Admission: RE | Admit: 2022-10-15 | Discharge: 2022-10-15 | Disposition: A | Discharge status: Home / Self Care | Payer: BC Managed Care – PPO | Source: Ambulatory Visit | Attending: Emergency Medicine | Admitting: Emergency Medicine

## 2022-10-15 VITALS — BP 147/83 | HR 84 | Temp 98.7°F | Resp 18 | Ht 64.0 in | Wt 214.8 lb

## 2022-10-15 DIAGNOSIS — H1033 Unspecified acute conjunctivitis, bilateral: Secondary | ICD-10-CM

## 2022-10-15 MED ORDER — POLYMYXIN B-TRIMETHOPRIM 10000-0.1 UNIT/ML-% OP SOLN: 1.0000 [drp] | Freq: Four times a day (QID) | OPHTHALMIC | 0 refills | Status: AC

## 2022-10-15 NOTE — ED Provider Notes (Signed)
Roderic Palau    CSN: [id number] Arrival date & time: 10/15/22  1028      History   Chief Complaint Chief Complaint  Patient presents with   Eye Problem    Possible pinkeye,  watery,  mucus, itchy, sore - Entered by patient    HPI Maria Bentley is a 51 y.o. female.  Patient presents with 4-day history of bilateral eye redness, itching, drainage, crusting in lashes. The drainage is yellow-green.  No eye trauma, eye pain, changes in vision.  No fever, sore throat, cough, shortness of breath, vomiting, diarrhea, or other symptoms.  Treatment at home with OTC lubricant eyedrops.  The history is provided by the patient and medical records.    Past Medical History:  Diagnosis Date   Abscess of right buttock 04/30/2019   Anemia    Anxiety    COVID-19 virus infection    Depression 2010   Diabetes mellitus    DKA, type 2 ([place]) 10/13/2019   Dysrhythmia    Endometriosis    Headache, common migraine, intractable    History of kidney stones    History of ovarian cyst    left sided, caused by her endometriosis (possibly an endometrioma), in her 63s.   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 deficit 07/09/2021   Encounter for screening colonoscopy    Pancreatic insufficiency 09/04/2020   Anemia in chronic kidney disease 08/25/2020   Hematuria 08/25/2020   Stage 3a chronic kidney disease ([place]) 08/25/2020   Analgesic overuse headache 08/24/2020   Atypical facial pain 08/24/2020   Bruxism (teeth grinding) 08/24/2020   Cervical syndrome 08/24/2020   Disorder of autonomic nervous  system 08/24/2020   Fibromyositis 08/24/2020   Temporomandibular joint disorder 08/24/2020   Hypoglycemic reaction 08/21/2020   Coronary atherosclerosis due to calcified coronary lesion 08/21/2020   Renal mass, left 07/06/2020   Chronic right shoulder pain 02/09/2020   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 disease ([place]) 01/26/2020   Recurrent headache 01/13/2020   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 12/30/2019   Elevated alkaline phosphatase level 11/21/2019   Hospital discharge follow-up 11/04/2019   Cervical spondylosis without myelopathy 10/29/2019   Pressure injury of skin 10/16/2019   History of gastric bypass 10/13/2019   COVID-19 virus infection 10/06/2019   Preoperative evaluation of a medical condition to rule out surgical contraindications (TAR required) 09/21/2019   Morbid obesity with BMI of 40.0-44.9, adult ([place]) 06/24/2019   History of bariatric surgery 04/15/2019   Cervicocranial syndrome 11/12/2018   Depressive disorder 11/12/2018   Fibromyalgia 06/05/2018   Iron deficiency anemia 06/05/2018   Hypertension 11/24/2017   Cervico-occipital neuralgia 05/02/2017   Low back pain 02/02/2017   Hip pain, chronic 12/28/2015   Pain in joint, ankle and foot 12/28/2015   Insomnia due to psychological stress 09/09/2015   Right hip pain 05/30/2014   Vitamin D deficiency 05/30/2014   Tachycardia 08/15/2013   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   Morbid obesity ([place]) 06/09/2012   Anxiety  Headache, common migraine, intractable    Hyperlipidemia    Hypothyroid    OSA (obstructive sleep apnea) 02/05/2012    Past Surgical History:  Procedure Laterality Date   CARPAL TUNNEL RELEASE Right 11/26/2020   Procedure: CARPAL TUNNEL RELEASE;  Surgeon: Hessie Knows, MD;  Location: [facility];  Service: Orthopedics;  Laterality: Right;   COLONOSCOPY WITH PROPOFOL N/A 10/21/2020   Procedure: COLONOSCOPY WITH PROPOFOL;  Surgeon: Virgel Manifold, MD;  Location: [facility];  Service: Endoscopy;  Laterality: N/A;   COLONOSCOPY WITH PROPOFOL N/A 09/15/2021   Procedure: COLONOSCOPY WITH PROPOFOL;  Surgeon: Virgel Manifold, MD;  Location: [facility];  Service: Endoscopy;  Laterality: N/A;   DILATION AND CURETTAGE OF UTERUS  2011   gastric bypass     OPEN REDUCTION INTERNAL FIXATION (ORIF) DISTAL RADIAL FRACTURE Right 11/26/2020   Procedure: Open reduction internal fixation, right distal radius;  Surgeon: Hessie Knows, MD;  Location: [facility];  Service: Orthopedics;  Laterality: Right;   OVARIAN CYST REMOVAL     ROTATOR CUFF REPAIR     right   SHOULDER ARTHROSCOPY WITH SUBACROMIAL DECOMPRESSION AND OPEN ROTATOR C Right 07/19/2020   Procedure: Right shoulder arthroscopic vs mini-open rotator cuff repair vs Regeneten patch application, subacomial dcompression, and biceps tenodesis;  Surgeon: Leim Fabry, MD;  Location: [facility];  Service: Orthopedics;  Laterality: Right;    OB History     Gravida  0   Para  0   Term  0   Preterm  0   AB  0   Living  0      SAB  0   IAB  0   Ectopic  0   Multiple  0   Live Births  0            Home Medications    Prior to Admission medications   Medication Sig Start Date End Date Taking? Authorizing Provider  trimethoprim-polymyxin b (POLYTRIM) ophthalmic solution Place 1 drop into both eyes 4 (four) times daily for 7 days. 10/15/22 10/22/22 Yes Sharion Balloon, NP  albuterol (VENTOLIN HFA) 108 (90 Base) MCG/ACT inhaler Inhale 2 puffs into the lungs every 6 (six) hours as needed for wheezing or shortness of breath. 10/02/19   Terald Sleeper, PA-C  amitriptyline (ELAVIL) 25 MG tablet TAKE 1 TABLET BY MOUTH EVERYDAY AT BEDTIME 03/13/22   [name], MD  aspirin-acetaminophen-caffeine (EXCEDRIN MIGRAINE) [id number] MG tablet Take 2 tablets by mouth every 6 (six) hours as needed for headache.    [provider]  atorvastatin (LIPITOR) 20 MG tablet Take 1 tablet  (20 mg total) by mouth daily. 06/05/22   [name], MD  Azelastine HCl 137 MCG/SPRAY SOLN SMARTSIG:1-2 Spray(s) Both Nares Twice Daily 11/22/21   [provider]  buPROPion (WELLBUTRIN XL) 150 MG 24 hr tablet Take 450 mg by mouth daily.  05/12/18   [provider]  CLINPRO 5000 1.1 % PSTE Place 1 application onto teeth at bedtime.  06/15/20   [provider]  Continuous Blood Gluc Receiver (FREESTYLE LIBRE 14 DAY READER) DEVI 1 applicator by Does not apply route 4 (four) times daily. 11/24/17   [name], MD  Continuous Blood Gluc Sensor (FREESTYLE LIBRE 14 DAY SENSOR) MISC USE TO CHECK BLOOD SUGAR FOUR TIMES A DAY AS NEEDED 12/23/21   [name], MD  cyanocobalamin (,VITAMIN B-12,) 1000 MCG/ML injection INJECT 1 ML (1,000 MCG TOTAL) INTO THE MUSCLE ONCE A WEEK. FOR 4  WEEKS, THEN MONTHLY THEREAFTER 11/23/21   [name], MD  cyclobenzaprine (FLEXERIL) 10 MG tablet Take 0.5-1 tablets (5-10 mg total) by mouth 3 (three) times daily as needed for muscle spasms. 07/07/21   [name], MD  diphenhydrAMINE (BENADRYL) 25 MG tablet Take 25 mg by mouth every 6 (six) hours as needed for allergies.    [provider]  EPINEPHrine 0.3 mg/0.3 mL IJ SOAJ injection Inject 0.3 mg into the muscle as needed for anaphylaxis.  06/25/10   [provider]  escitalopram (LEXAPRO) 20 MG tablet Take 20 mg by mouth daily.  02/27/17   [provider]  fluticasone (FLONASE) 50 MCG/ACT nasal spray SPRAY 2 SPRAYS INTO EACH NOSTRIL EVERY DAY 04/21/21   [name], MD  furosemide (LASIX) 20 MG tablet TAKE 1 TABLET (20 MG TOTAL) BY MOUTH DAILY. AS NEEDED FOR FLUID RETENTION 02/01/21   [name], MD  HORIZANT 600 MG TBCR TAKE 1 TABLET BY MOUTH TWICE A DAY 01/09/22   [name], MD  Insulin Glargine [facility]) 100 UNIT/ML INJECT 45 UNITS INTO THE SKIN AT BEDTIME. 09/05/22   [name], MD  Insulin Pen Needle (BD PEN NEEDLE NANO U/F) 32G X 4  MM MISC Please specify directions, refills and quantity 05/31/22   [name], MD  lamoTRIgine (LAMICTAL) 150 MG tablet Take 150 mg by mouth daily.    [provider]  levothyroxine (SYNTHROID) 50 MCG tablet TAKE 1 TABLET BY MOUTH EVERY DAY 09/14/22   [name], MD  lipase/protease/amylase (CREON) 36000 UNITS CPEP capsule TAKE 2 CAPSULES BY MOUTH 3 (THREE) TIMES DAILY BEFORE MEALS AND 1 CAPSULE WITH SNACKS. 05/10/22   Lin Landsman, MD  LORazepam (ATIVAN) 0.5 MG tablet Take 0.5-1 mg by mouth daily as needed for anxiety.     [provider]  losartan (COZAAR) 25 MG tablet Take 1 tablet (25 mg total) by mouth daily. 06/05/22 06/05/23  [name], MD  meclizine (ANTIVERT) 25 MG tablet Take 1 tablet (25 mg total) by mouth 3 (three) times daily as needed for dizziness. 06/16/15   [name], MD  Melatonin 10 MG TABS Take 10 mg by mouth at bedtime as needed (sleep).     [provider]  metoprolol tartrate (LOPRESSOR) 25 MG tablet TAKE 0.5 TABLETS BY MOUTH 2 TIMES DAILY. 08/20/22   Dutch Quint B, FNP  NOVOLOG FLEXPEN 100 UNIT/ML FlexPen INJECT 20 UNITS INTO THE SKIN 3 (THREE) TIMES DAILY WITH MEALS. 08/31/22   [name], MD  NURTEC 75 MG TBDP Take 1 tablet by mouth daily as needed. 11/25/21   [provider]  nystatin (MYCOSTATIN/NYSTOP) powder APPLY 1 APPLICATION TO AFFECTED AREA TWICE A DAY 12/23/20   Leone Haven, MD  OnabotulinumtoxinA (BOTOX IJ) Inject as directed every 3 (three) months. For migraine    [provider]  ondansetron (ZOFRAN ODT) 4 MG disintegrating tablet Take 1 tablet (4 mg total) by mouth every 8 (eight) hours as needed for nausea or vomiting. 10/03/21   Blake Divine, MD  Rimegepant Sulfate (NURTEC PO) Take by mouth as needed.    [provider]  SYRINGE-NEEDLE, DISP, 3 ML (BD INTEGRA SYRINGE) 25G X 1" 3 ML MISC FOR USE WITH B12 INJECTIONS WEEKLY/MONTHLY 08/01/18   [name], MD    Family  History Family History  Problem Relation Age of Onset   Diabetes Mother    Hypertension Mother    Hypertension  Father    Diabetes Father    Depression Other        strong hx of   Breast cancer Paternal Grandmother 2   Breast cancer Maternal Grandmother    Cancer Neg Hx     Social History Social History   Tobacco Use   Smoking status: Former    Packs/day: 1.00    Years: 4.00    Total pack years: 4.00    Types: Cigarettes    Quit date: 02/05/1996    Years since quitting: 26.7   Smokeless tobacco: Never  Vaping Use   Vaping Use: Never used  Substance Use Topics   Alcohol use: No   Drug use: No     Allergies   Amoxicillin, Avocado, Citrullus vulgaris, Clarithromycin, Clarithromycin, Honey, Other, Sulfa antibiotics, Sulfa drugs cross reactors, Watermelon concentrate, Charentais melon (french melon), Insulin detemir, Silicone, Tape, and Victoza [liraglutide]   Review of Systems Review of Systems  Constitutional:  Negative for chills and fever.  HENT:  Negative for ear pain and sore throat.   Eyes:  Positive for discharge, redness and itching. Negative for pain and visual disturbance.  Respiratory:  Negative for cough and shortness of breath.   Gastrointestinal:  Negative for diarrhea and vomiting.  Skin:  Negative for rash.  All other systems reviewed and are negative.    Physical Exam Triage Vital Signs ED Triage Vitals  Enc Vitals Group     BP 10/15/22 1048 (!) 153/82     Pulse Rate 10/15/22 1048 84     Resp 10/15/22 1048 18     Temp 10/15/22 1048 98.7 F (37.1 C)     Temp src --      SpO2 10/15/22 1048 95 %     Weight 10/15/22 1045 214 lb 12.8 oz (97.4 kg)     Height 10/15/22 1045 '5\' 4"'$  (1.626 m)     Head Circumference --      Peak Flow --      Pain Score 10/15/22 1043 2     Pain Loc --      Pain Edu? --      Excl. in [place]? --    No data found.  Updated Vital Signs BP (!) 147/83   Pulse 84   Temp 98.7 F (37.1 C)   Resp 18   Ht '5\' 4"'$  (1.626 m)    Wt 214 lb 12.8 oz (97.4 kg)   LMP 05/09/2016 (LMP Unknown) Comment: D/C  SpO2 95%   BMI 36.87 kg/m   Visual Acuity Right Eye Distance:   Left Eye Distance:   Bilateral Distance:    Right Eye Near:   Left Eye Near:    Bilateral Near:     Physical Exam Vitals and nursing note reviewed.  Constitutional:      General: She is not in acute distress.    Appearance: She is well-developed. She is not ill-appearing.  HENT:     Right Ear: Tympanic membrane normal.     Left Ear: Tympanic membrane normal.     Nose: Nose normal.     Mouth/Throat:     Mouth: Mucous membranes are moist.     Pharynx: Oropharynx is clear.  Eyes:     General: Lids are normal. Vision grossly intact.     Extraocular Movements: Extraocular movements intact.     Conjunctiva/sclera:     Right eye: Right conjunctiva is injected.     Left eye: Left conjunctiva is injected.  Pupils: Pupils are equal, round, and reactive to light.  Cardiovascular:     Rate and Rhythm: Normal rate and regular rhythm.     Heart sounds: Normal heart sounds.  Pulmonary:     Effort: Pulmonary effort is normal. No respiratory distress.     Breath sounds: Normal breath sounds.  Musculoskeletal:     Cervical back: Neck supple.  Skin:    General: Skin is warm and dry.  Neurological:     Mental Status: She is alert.  Psychiatric:        Mood and Affect: Mood normal.        Behavior: Behavior normal.      UC Treatments / Results  Labs (all labs ordered are listed, but only abnormal results are displayed) Labs Reviewed - No data to display  EKG   Radiology No results found.  Procedures Procedures (including critical care time)  Medications Ordered in UC Medications - No data to display  Initial Impression / Assessment and Plan / UC Course  I have reviewed the triage vital signs and the nursing notes.  Pertinent labs & imaging results that were available during my care of the patient were reviewed by me and  considered in my medical decision making (see chart for details).    Acute bacterial conjunctivitis of both eyes.  Treating with Polytrim eyedrops.  Education provided on conjunctivitis.  Instructed patient to follow-up with her PCP if her symptoms are not improving.  She agrees to plan of care.   Final Clinical Impressions(s) / UC Diagnoses   Final diagnoses:  Acute bacterial conjunctivitis of both eyes     Discharge Instructions      Use the antibiotic eyedrops as prescribed.    Follow-up with your primary care provider if your symptoms are not improving.        ED Prescriptions     Medication Sig Dispense Auth. Provider   trimethoprim-polymyxin b (POLYTRIM) ophthalmic solution Place 1 drop into both eyes 4 (four) times daily for 7 days. 10 mL Sharion Balloon, NP      PDMP not reviewed this encounter.   Sharion Balloon, NP 10/15/22 1125

## 2022-10-15 NOTE — ED Triage Notes (Signed)
Patient to Urgent Care with complaints of watery/ itchy and sore eyes. Reports that she has also had drainage. Reports she does have seasonal allergies. Pressure across the bridge of her nose.   Symptoms started on Thursday.

## 2022-10-15 NOTE — Discharge Instructions (Addendum)
Use the antibiotic eyedrops as prescribed.  Follow up with your primary care provider if your symptoms are not improving.   ? ? ?

## 2022-11-04 ENCOUNTER — Other Ambulatory Visit: Payer: Self-pay | Admitting: Internal Medicine

## 2022-11-10 ENCOUNTER — Other Ambulatory Visit: Payer: Self-pay | Admitting: Internal Medicine

## 2022-11-16 ENCOUNTER — Other Ambulatory Visit: Payer: Self-pay | Admitting: Internal Medicine

## 2022-11-21 NOTE — Telephone Encounter (Signed)
MyChart messgae sent to patient. 

## 2022-11-22 NOTE — Telephone Encounter (Signed)
MyChart messgae sent to patient. 

## 2022-12-12 LAB — HM DIABETES EYE EXAM

## 2022-12-15 ENCOUNTER — Other Ambulatory Visit: Payer: Self-pay | Admitting: Internal Medicine

## 2022-12-16 ENCOUNTER — Other Ambulatory Visit: Payer: Self-pay | Admitting: Internal Medicine

## 2022-12-18 ENCOUNTER — Other Ambulatory Visit: Payer: Self-pay | Admitting: Internal Medicine

## 2022-12-19 MED ORDER — LANTUS SOLOSTAR 100 UNIT/ML ~~LOC~~ SOPN: 45.0000 [IU] | PEN_INJECTOR | Freq: Every day | SUBCUTANEOUS | 99 refills | Status: DC

## 2022-12-19 NOTE — Telephone Encounter (Signed)
Message from pharmacy insurance is requesting Lantus.

## 2022-12-23 ENCOUNTER — Ambulatory Visit
Admission: EM | Admit: 2022-12-23 | Discharge: 2022-12-23 | Disposition: A | Discharge status: Home / Self Care | Payer: BC Managed Care – PPO | Attending: Emergency Medicine | Admitting: Emergency Medicine

## 2022-12-23 ENCOUNTER — Ambulatory Visit (INDEPENDENT_AMBULATORY_CARE_PROVIDER_SITE_OTHER): Payer: BC Managed Care – PPO

## 2022-12-23 DIAGNOSIS — S80211A Abrasion, right knee, initial encounter: Secondary | ICD-10-CM

## 2022-12-23 DIAGNOSIS — M25461 Effusion, right knee: Secondary | ICD-10-CM | POA: Diagnosis not present

## 2022-12-23 DIAGNOSIS — S82001B Unspecified fracture of right patella, initial encounter for open fracture type I or II: Secondary | ICD-10-CM

## 2022-12-23 DIAGNOSIS — M25561 Pain in right knee: Secondary | ICD-10-CM

## 2022-12-23 DIAGNOSIS — M25569 Pain in unspecified knee: Secondary | ICD-10-CM | POA: Insufficient documentation

## 2022-12-23 NOTE — ED Provider Notes (Signed)
Roderic Palau    CSN: [id number] Arrival date & time: 12/23/22  1007      History   Chief Complaint Chief Complaint  Patient presents with   Leg Pain    HPI Maria Bentley is a 52 y.o. female.  Patient presents with 3 day history of right knee pain after she accidentally fell while stepping off a curb.  She landed on her knee.  She has an abrasion on her knee.  No LOC or head injury.  No numbness, weakness, paresthesias, fever, wound drainage, or other symptoms.   The pain is worse with extension of knee, ambulation, and weight bearing.  Treatment at home with knee sleeve.  Her medical history includes hypertension, diabetes, CKD, obstructive sleep apnea, morbid obesity, fibromyalgia, chronic joint pain, gastric bypass.   The history is provided by the patient and medical records.    Past Medical History:  Diagnosis Date   Abscess of right buttock 04/30/2019   Anemia    Anxiety    COVID-19 virus infection    Depression 2010   Diabetes mellitus    DKA, type 2 ([place]) 10/13/2019   Dysrhythmia    Endometriosis    Headache, common migraine, intractable    History of kidney stones    History of ovarian cyst    left sided, caused by her endometriosis (possibly an endometrioma), in her 8s.   Hyperlipidemia    Hypertension    Kidney stones    left   Morbid obesity ([place])    s/p gastric bypass   Personal hx of gastric bypass 2010   Rosacea    Vertigo     Patient Active Problem List   Diagnosis Date Noted   Knee pain 12/23/2022   Radiculitis, cervical 11/28/2021   COVID-19 long hauler manifesting chronic fatigue 11/28/2021   History of UTI 11/28/2021   Polyp of transverse colon    COVID-19 long hauler manifesting chronic concentration deficit 07/09/2021   Encounter for screening colonoscopy    Pancreatic insufficiency 09/04/2020   Anemia in chronic kidney disease 08/25/2020   Hematuria 08/25/2020   Stage 3a chronic kidney disease ([place]) 08/25/2020    Analgesic overuse headache 08/24/2020   Atypical facial pain 08/24/2020   Bruxism (teeth grinding) 08/24/2020   Cervical syndrome 08/24/2020   Disorder of autonomic nervous system 08/24/2020   Fibromyositis 08/24/2020   Temporomandibular joint disorder 08/24/2020   Hypoglycemic reaction 08/21/2020   Coronary atherosclerosis due to calcified coronary lesion 08/21/2020   Renal mass, left 07/06/2020   Chronic right shoulder pain 02/09/2020   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 disease ([place]) 01/26/2020   Recurrent headache 01/13/2020   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 12/30/2019   Elevated alkaline phosphatase level 11/21/2019   Hospital discharge follow-up 11/04/2019   Cervical spondylosis without myelopathy 10/29/2019   Pressure injury of skin 10/16/2019   History of gastric bypass 10/13/2019   COVID-19 virus infection 10/06/2019   Preoperative evaluation of a medical condition to rule out surgical contraindications (TAR required) 09/21/2019   Morbid obesity with BMI of 40.0-44.9, adult ([place]) 06/24/2019   History of bariatric surgery 04/15/2019   Cervicocranial syndrome 11/12/2018   Depressive disorder 11/12/2018   Fibromyalgia 06/05/2018   Iron deficiency anemia 06/05/2018   Hypertension 11/24/2017   Cervico-occipital neuralgia 05/02/2017   Low back pain 02/02/2017   Hip pain, chronic 12/28/2015   Pain in joint, ankle and foot 12/28/2015   Insomnia due to psychological stress 09/09/2015  Right hip pain 05/30/2014   Vitamin D deficiency 05/30/2014   Tachycardia 08/15/2013   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   Morbid obesity ([place]) 06/09/2012   Anxiety    Headache, common migraine, intractable    Hyperlipidemia    Hypothyroid    OSA (obstructive sleep apnea) 02/05/2012    Past Surgical History:  Procedure Laterality Date   CARPAL TUNNEL RELEASE Right 11/26/2020   Procedure: CARPAL TUNNEL RELEASE;  Surgeon: Hessie Knows, MD;   Location: [facility];  Service: Orthopedics;  Laterality: Right;   COLONOSCOPY WITH PROPOFOL N/A 10/21/2020   Procedure: COLONOSCOPY WITH PROPOFOL;  Surgeon: Virgel Manifold, MD;  Location: [facility];  Service: Endoscopy;  Laterality: N/A;   COLONOSCOPY WITH PROPOFOL N/A 09/15/2021   Procedure: COLONOSCOPY WITH PROPOFOL;  Surgeon: Virgel Manifold, MD;  Location: [facility];  Service: Endoscopy;  Laterality: N/A;   DILATION AND CURETTAGE OF UTERUS  2011   gastric bypass     OPEN REDUCTION INTERNAL FIXATION (ORIF) DISTAL RADIAL FRACTURE Right 11/26/2020   Procedure: Open reduction internal fixation, right distal radius;  Surgeon: Hessie Knows, MD;  Location: [facility];  Service: Orthopedics;  Laterality: Right;   OVARIAN CYST REMOVAL     ROTATOR CUFF REPAIR     right   SHOULDER ARTHROSCOPY WITH SUBACROMIAL DECOMPRESSION AND OPEN ROTATOR C Right 07/19/2020   Procedure: Right shoulder arthroscopic vs mini-open rotator cuff repair vs Regeneten patch application, subacomial dcompression, and biceps tenodesis;  Surgeon: Leim Fabry, MD;  Location: [facility];  Service: Orthopedics;  Laterality: Right;    OB History     Gravida  0   Para  0   Term  0   Preterm  0   AB  0   Living  0      SAB  0   IAB  0   Ectopic  0   Multiple  0   Live Births  0            Home Medications    Prior to Admission medications   Medication Sig Start Date End Date Taking? Authorizing Provider  albuterol (VENTOLIN HFA) 108 (90 Base) MCG/ACT inhaler Inhale 2 puffs into the lungs every 6 (six) hours as needed for wheezing or shortness of breath. 10/02/19   Terald Sleeper, PA-C  amitriptyline (ELAVIL) 25 MG tablet TAKE 1 TABLET BY MOUTH EVERYDAY AT BEDTIME 11/06/22   [name], MD  aspirin-acetaminophen-caffeine (EXCEDRIN MIGRAINE) [phone] MG tablet Take 2 tablets by mouth every 6 (six) hours as needed for headache.    [provider]  atorvastatin (LIPITOR) 20 MG  tablet Take 1 tablet (20 mg total) by mouth daily. 06/05/22   [name], MD  Azelastine HCl 137 MCG/SPRAY SOLN SMARTSIG:1-2 Spray(s) Both Nares Twice Daily 11/22/21   [provider]  buPROPion (WELLBUTRIN XL) 150 MG 24 hr tablet Take 450 mg by mouth daily.  05/12/18   [provider]  CLINPRO 5000 1.1 % PSTE Place 1 application onto teeth at bedtime.  06/15/20   [provider]  Continuous Blood Gluc Receiver (FREESTYLE LIBRE 14 DAY READER) DEVI 1 applicator by Does not apply route 4 (four) times daily. 11/24/17   [name], MD  Continuous Blood Gluc Sensor (FREESTYLE LIBRE 14 DAY SENSOR) MISC USE TO CHECK BLOOD SUGAR FOUR TIMES A DAY AS NEEDED 12/23/21   [name], MD  cyanocobalamin (,VITAMIN B-12,) 1000 MCG/ML injection INJECT 1 ML (1,000 MCG TOTAL) INTO THE  MUSCLE ONCE A WEEK. FOR 4 WEEKS, THEN MONTHLY THEREAFTER 11/23/21   [name], MD  cyclobenzaprine (FLEXERIL) 10 MG tablet Take 0.5-1 tablets (5-10 mg total) by mouth 3 (three) times daily as needed for muscle spasms. 07/07/21   [name], MD  diphenhydrAMINE (BENADRYL) 25 MG tablet Take 25 mg by mouth every 6 (six) hours as needed for allergies.    [provider]  EPINEPHrine 0.3 mg/0.3 mL IJ SOAJ injection Inject 0.3 mg into the muscle as needed for anaphylaxis.  06/25/10   [provider]  escitalopram (LEXAPRO) 20 MG tablet Take 20 mg by mouth daily.  02/27/17   [provider]  fluticasone (FLONASE) 50 MCG/ACT nasal spray SPRAY 2 SPRAYS INTO EACH NOSTRIL EVERY DAY 04/21/21   [name], MD  furosemide (LASIX) 20 MG tablet TAKE 1 TABLET (20 MG TOTAL) BY MOUTH DAILY. AS NEEDED FOR FLUID RETENTION 02/01/21   [name], MD  HORIZANT 600 MG TBCR TAKE 1 TABLET BY MOUTH TWICE A DAY 11/17/22   [name], MD  insulin aspart (NOVOLOG FLEXPEN) 100 UNIT/ML FlexPen INJECT 20 UNITS INTO THE SKIN 3 (THREE) TIMES DAILY WITH MEALS. 12/18/22   [name], MD   insulin glargine (LANTUS SOLOSTAR) 100 UNIT/ML Solostar Pen Inject 45 Units into the skin daily. 12/19/22   [name], MD  Insulin Pen Needle (BD PEN NEEDLE NANO U/F) 32G X 4 MM MISC Please specify directions, refills and quantity 05/31/22   [name], MD  lamoTRIgine (LAMICTAL) 150 MG tablet Take 150 mg by mouth daily.    [provider]  levothyroxine (SYNTHROID) 50 MCG tablet TAKE 1 TABLET BY MOUTH EVERY DAY 09/14/22   [name], MD  lipase/protease/amylase (CREON) 36000 UNITS CPEP capsule TAKE 2 CAPSULES BY MOUTH 3 (THREE) TIMES DAILY BEFORE MEALS AND 1 CAPSULE WITH SNACKS. 05/10/22   Lin Landsman, MD  LORazepam (ATIVAN) 0.5 MG tablet Take 0.5-1 mg by mouth daily as needed for anxiety.     [provider]  losartan (COZAAR) 25 MG tablet Take 1 tablet (25 mg total) by mouth daily. 06/05/22 06/05/23  [name], MD  meclizine (ANTIVERT) 25 MG tablet Take 1 tablet (25 mg total) by mouth 3 (three) times daily as needed for dizziness. 06/16/15   [name], MD  Melatonin 10 MG TABS Take 10 mg by mouth at bedtime as needed (sleep).     [provider]  metoprolol tartrate (LOPRESSOR) 25 MG tablet TAKE 0.5 TABLETS BY MOUTH 2 TIMES DAILY. 08/20/22   Dutch Quint B, FNP  NURTEC 75 MG TBDP Take 1 tablet by mouth daily as needed. 11/25/21   [provider]  nystatin (MYCOSTATIN/NYSTOP) powder APPLY 1 APPLICATION TO AFFECTED AREA TWICE A DAY 12/23/20   Leone Haven, MD  OnabotulinumtoxinA (BOTOX IJ) Inject as directed every 3 (three) months. For migraine    [provider]  ondansetron (ZOFRAN ODT) 4 MG disintegrating tablet Take 1 tablet (4 mg total) by mouth every 8 (eight) hours as needed for nausea or vomiting. 10/03/21   Blake Divine, MD  Rimegepant Sulfate (NURTEC PO) Take by mouth as needed.    [provider]  SYRINGE-NEEDLE, DISP, 3 ML (BD INTEGRA SYRINGE) 25G X 1" 3 ML MISC FOR USE WITH B12 INJECTIONS  WEEKLY/MONTHLY 08/01/18   [name], MD    Family History Family History  Problem Relation Age of Onset   Diabetes Mother  Hypertension Mother    Hypertension Father    Diabetes Father    Depression Other        strong hx of   Breast cancer Paternal Grandmother 2   Breast cancer Maternal Grandmother    Cancer Neg Hx     Social History Social History   Tobacco Use   Smoking status: Former    Packs/day: 1.00    Years: 4.00    Total pack years: 4.00    Types: Cigarettes    Quit date: 02/05/1996    Years since quitting: 26.8   Smokeless tobacco: Never  Vaping Use   Vaping Use: Never used  Substance Use Topics   Alcohol use: No   Drug use: No     Allergies   Amoxicillin, Avocado, Citrullus vulgaris, Clarithromycin, Clarithromycin, Honey, Other, Sulfa antibiotics, Sulfa drugs cross reactors, Watermelon concentrate, Charentais melon (french melon), Insulin detemir, Silicone, Tape, and Victoza [liraglutide]   Review of Systems Review of Systems  Constitutional:  Negative for chills and fever.  Musculoskeletal:  Positive for arthralgias, gait problem and joint swelling.  Skin:  Positive for color change and wound.  Neurological:  Negative for weakness and numbness.  All other systems reviewed and are negative.    Physical Exam Triage Vital Signs ED Triage Vitals  Enc Vitals Group     BP 12/23/22 1053 127/84     Pulse Rate 12/23/22 1043 91     Resp 12/23/22 1043 18     Temp 12/23/22 1043 97.9 F (36.6 C)     Temp src --      SpO2 12/23/22 1043 94 %     Weight 12/23/22 1051 210 lb (95.3 kg)     Height 12/23/22 1051 '5\' 4"'$  (1.626 m)     Head Circumference --      Peak Flow --      Pain Score 12/23/22 1047 7     Pain Loc --      Pain Edu? --      Excl. in [place]? --    No data found.  Updated Vital Signs BP 127/84   Pulse 91   Temp 97.9 F (36.6 C)   Resp 18   Ht '5\' 4"'$  (1.626 m)   Wt 210 lb (95.3 kg)   LMP 05/09/2016 (LMP Unknown) Comment: D/C   SpO2 94%   BMI 36.05 kg/m   Visual Acuity Right Eye Distance:   Left Eye Distance:   Bilateral Distance:    Right Eye Near:   Left Eye Near:    Bilateral Near:     Physical Exam Vitals and nursing note reviewed.  Constitutional:      General: She is not in acute distress.    Appearance: She is well-developed. She is not ill-appearing.  HENT:     Mouth/Throat:     Mouth: Mucous membranes are moist.  Cardiovascular:     Rate and Rhythm: Normal rate and regular rhythm.     Heart sounds: Normal heart sounds.  Pulmonary:     Effort: Pulmonary effort is normal. No respiratory distress.     Breath sounds: Normal breath sounds.  Musculoskeletal:        General: Swelling and tenderness present. No deformity. Normal range of motion.     Cervical back: Neck supple.     Comments: Right knee tender to palpation.   Skin:    General: Skin is warm and dry.     Capillary Refill: Capillary refill takes less than  2 seconds.     Findings: Lesion present.     Comments: Superficial abrasion of right knee.  See pictures.   Neurological:     General: No focal deficit present.     Mental Status: She is alert and oriented to person, place, and time.     Sensory: No sensory deficit.     Motor: No weakness.     Gait: Gait abnormal.     Comments: Ambulatory with cane.  Psychiatric:        Mood and Affect: Mood normal.        Behavior: Behavior normal.         UC Treatments / Results  Labs (all labs ordered are listed, but only abnormal results are displayed) Labs Reviewed - No data to display  EKG   Radiology DG Knee Complete 4 Views Right  Result Date: 12/23/2022 CLINICAL DATA:  Right knee pain after falling 3 days previously EXAM: RIGHT KNEE - COMPLETE 4+ VIEW COMPARISON:  None Available. FINDINGS: Linear lucency through the lateral aspect of the patella concerning for nondisplaced patellar fracture. Tricompartmental degenerative osteoarthritis is present. Small suprapatellar  knee joint effusion. Soft tissue swelling present over the anterolateral knee. IMPRESSION: Probable linear fracture through the lateral aspect of the patella. Tricompartmental degenerative osteoarthritis. Small knee joint effusion. Electronically Signed   By: Jacqulynn Cadet M.D.   On: 12/23/2022 11:15    Procedures Procedures (including critical care time)  Medications Ordered in UC Medications - No data to display  Initial Impression / Assessment and Plan / UC Course  I have reviewed the triage vital signs and the nursing notes.  Pertinent labs & imaging results that were available during my care of the patient were reviewed by me and considered in my medical decision making (see chart for details).   Right patella fracture, right knee effusion, abrasion of right knee.  Xray shows "Probable linear fracture through the lateral aspect of the patella. Tricompartmental degenerative osteoarthritis. Small knee joint effusion."  Treating with knee immobilizer, rest, elevation, ice packs.  Wound dressed with antibiotic ointment and nonadherent dressing prior to knee immobilizer.  Education provided on patella fracture.  Instructed patient to follow-up with orthopedics and contact information for on-call Ortho provided.  Patient agrees to plan of care.     Final Clinical Impressions(s) / UC Diagnoses   Final diagnoses:  Type I or II open fracture of right patella, unspecified fracture morphology, initial encounter  Knee effusion, right  Abrasion of right knee, initial encounter     Discharge Instructions      Rest and elevate your knee.  Apply ice packs 2-3 times a day for up to 20 minutes each.  Wear the knee immobilizer.    Follow up with an orthopedist such as the one listed below.        ED Prescriptions   None    I have reviewed the PDMP during this encounter.   Sharion Balloon, NP 12/23/22 1137

## 2022-12-23 NOTE — ED Triage Notes (Addendum)
Patient to Urgent Care with complaints of right sided knee pain after injury from a fall three days ago. Reports that she had her eyes dilated at her eye appointment, when she left the eye center she took a step off the curb and fell. No LOC or head trauma.   Patient has a knee brace in place. Reports pain when moving knee side to side/ twisting and walking. Denies hx of previous injury.

## 2022-12-23 NOTE — Discharge Instructions (Addendum)
Rest and elevate your knee.  Apply ice packs 2-3 times a day for up to 20 minutes each.  Wear the knee immobilizer.    Follow up with an orthopedist such as the one listed below.

## 2022-12-24 ENCOUNTER — Ambulatory Visit: Payer: BC Managed Care – PPO

## 2022-12-26 ENCOUNTER — Encounter: Payer: Self-pay | Admitting: Ophthalmology

## 2022-12-29 NOTE — Discharge Instructions (Signed)
   Cataract Surgery, Care After ? ?This sheet gives you information about how to care for yourself after your surgery.  Your ophthalmologist may also give you more specific instructions.  If you have problems or questions, contact your doctor at [facility], [phone]. ? ?What can I expect after the surgery? ?It is common to have: ?Itching ?Foreign body sensation (feels like a grain of sand in the eye) ?Watery discharge (excess tearing) ?Sensitivity to light and touch ?Bruising in or around the eye ?Mild blurred vision ? ?Follow these instructions at home: ?Do not touch or rub your eyes. ?You may be told to wear a protective shield or sunglasses to protect your eyes. ?Do not put a contact lens in the operative eye unless your doctor approves. ?Keep the lids and face clean and dry. ?Do not allow water to hit you directly in the face while showering. ?Keep soap and shampoo out of your eyes. ?Do not use eye makeup for 1 week. ? ?Check your eye every day for signs of infection.  Watch for: ?Redness, swelling, or pain. ?Fluid, blood or pus. ?Worsening vision. ?Worsening sensitivity to light or touch. ? ?Activity: ?During the first day, avoid bending over and reading.  You may resume reading and bending the next day. ?Do not drive or use heavy machinery for at least 24 hours. ?Avoid strenuous activities for 1 week.  Activities such as walking, treadmill, exercise bike, and climbing stairs are okay. ?Do not lift heavy (>20 pound) objects for 1 week. ?Do not do yardwork, gardening, or dirty housework (mopping, cleaning bathrooms, vacuuming, etc.) for 1 week. ?Do not swim or use a hot tub for 2 weeks. ?Ask your doctor when you can return to work. ? ?General Instructions: ?Take or apply prescription and over-the-counter medicines as directed by your doctor, including eyedrops and ointments. ?Resume medications discontinued prior to surgery, unless told otherwise by your doctor. ?Keep all follow up appointments as  scheduled. ? ?Contact a health care provider if: ?You have increased bruising around your eye. ?You have pain that is not helped with medication. ?You have a fever. ?You have fluid, pus, or blood coming from your eye or incision. ?Your sensitivity to light gets worse. ?You have spots (floaters) of flashing lights in your vision. ?You have nausea or vomiting. ? ?Go to the nearest emergency room or call 911 if: ?You have sudden loss of vision. ?You have severe, worsening eye pain. ? ?

## 2023-01-01 ENCOUNTER — Other Ambulatory Visit: Payer: Self-pay

## 2023-01-01 ENCOUNTER — Ambulatory Visit
Admission: RE | Admit: 2023-01-01 | Discharge: 2023-01-01 | Disposition: A | Discharge status: Home / Self Care | Payer: BC Managed Care – PPO | Attending: Ophthalmology | Admitting: Ophthalmology

## 2023-01-01 ENCOUNTER — Encounter: Payer: Self-pay | Admitting: Ophthalmology

## 2023-01-01 ENCOUNTER — Ambulatory Visit: Payer: BC Managed Care – PPO | Admitting: Anesthesiology

## 2023-01-01 ENCOUNTER — Encounter
Admission: RE | Disposition: A | Discharge status: Home / Self Care | Payer: Self-pay | Source: Home / Self Care | Attending: Ophthalmology

## 2023-01-01 DIAGNOSIS — I129 Hypertensive chronic kidney disease with stage 1 through stage 4 chronic kidney disease, or unspecified chronic kidney disease: Secondary | ICD-10-CM | POA: Insufficient documentation

## 2023-01-01 DIAGNOSIS — I2510 Atherosclerotic heart disease of native coronary artery without angina pectoris: Secondary | ICD-10-CM | POA: Diagnosis not present

## 2023-01-01 DIAGNOSIS — Z9884 Bariatric surgery status: Secondary | ICD-10-CM | POA: Diagnosis not present

## 2023-01-01 DIAGNOSIS — M797 Fibromyalgia: Secondary | ICD-10-CM | POA: Diagnosis not present

## 2023-01-01 DIAGNOSIS — E1136 Type 2 diabetes mellitus with diabetic cataract: Secondary | ICD-10-CM | POA: Insufficient documentation

## 2023-01-01 DIAGNOSIS — H2511 Age-related nuclear cataract, right eye: Secondary | ICD-10-CM | POA: Diagnosis not present

## 2023-01-01 DIAGNOSIS — Z794 Long term (current) use of insulin: Secondary | ICD-10-CM | POA: Insufficient documentation

## 2023-01-01 DIAGNOSIS — E039 Hypothyroidism, unspecified: Secondary | ICD-10-CM | POA: Insufficient documentation

## 2023-01-01 DIAGNOSIS — N183 Chronic kidney disease, stage 3 unspecified: Secondary | ICD-10-CM | POA: Diagnosis not present

## 2023-01-01 DIAGNOSIS — E1122 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 disease: Secondary | ICD-10-CM | POA: Diagnosis not present

## 2023-01-01 HISTORY — DX: Hypothyroidism, unspecified: E03.9

## 2023-01-01 HISTORY — DX: Family history of other specified conditions: Z84.89

## 2023-01-01 HISTORY — DX: Chronic kidney disease, stage 3 unspecified: N18.30

## 2023-01-01 HISTORY — DX: Motion sickness, initial encounter: T75.3XXA

## 2023-01-01 HISTORY — DX: Fibromyalgia: M79.7

## 2023-01-01 HISTORY — PX: CATARACT EXTRACTION W/PHACO: SHX586

## 2023-01-01 HISTORY — DX: Unspecified osteoarthritis, unspecified site: M19.90

## 2023-01-01 LAB — GLUCOSE, CAPILLARY: Glucose-Capillary: 175 mg/dL — ABNORMAL HIGH (ref 70–99)

## 2023-01-01 SURGERY — PHACOEMULSIFICATION, CATARACT, WITH IOL INSERTION
Anesthesia: Monitor Anesthesia Care | Site: Eye | Laterality: Right

## 2023-01-01 MED ORDER — FENTANYL CITRATE (PF) 100 MCG/2ML IJ SOLN
INTRAMUSCULAR | Status: DC | PRN
  Administered 2023-01-01: 50 ug via INTRAVENOUS

## 2023-01-01 MED ORDER — SODIUM CHLORIDE 0.9% FLUSH
INTRAVENOUS | Status: DC | PRN
  Administered 2023-01-01: 10 mL via INTRAVENOUS

## 2023-01-01 MED ORDER — SIGHTPATH DOSE#1 NA HYALUR & NA CHOND-NA HYALUR IO KIT
PACK | INTRAOCULAR | Status: DC | PRN
  Administered 2023-01-01: 1 via OPHTHALMIC

## 2023-01-01 MED ORDER — SIGHTPATH DOSE#1 BSS IO SOLN
INTRAOCULAR | Status: DC | PRN
  Administered 2023-01-01: 15 mL

## 2023-01-01 MED ORDER — MIDAZOLAM HCL 2 MG/2ML IJ SOLN
INTRAMUSCULAR | Status: DC | PRN
  Administered 2023-01-01: 1 mg via INTRAVENOUS

## 2023-01-01 MED ORDER — ACETAMINOPHEN 325 MG PO TABS
650.0000 mg | ORAL_TABLET | Freq: Once | ORAL | Status: AC | PRN
  Administered 2023-01-01: 650 mg via ORAL

## 2023-01-01 MED ORDER — ACETAMINOPHEN 160 MG/5ML PO SOLN: 325.0000 mg | ORAL | Status: AC | PRN

## 2023-01-01 MED ORDER — ONDANSETRON HCL 4 MG/2ML IJ SOLN
4.0000 mg | Freq: Once | INTRAMUSCULAR | Status: AC | PRN
  Administered 2023-01-01: 4 mg via INTRAVENOUS

## 2023-01-01 MED ORDER — LACTATED RINGERS IV SOLN: INTRAVENOUS | Status: DC

## 2023-01-01 MED ORDER — SIGHTPATH DOSE#1 BSS IO SOLN
INTRAOCULAR | Status: DC | PRN
  Administered 2023-01-01: 92 mL via OPHTHALMIC

## 2023-01-01 MED ORDER — MOXIFLOXACIN HCL 0.5 % OP SOLN
OPHTHALMIC | Status: DC | PRN
  Administered 2023-01-01: .2 mL via OPHTHALMIC

## 2023-01-01 MED ORDER — LIDOCAINE HCL (PF) 2 % IJ SOLN
INTRAOCULAR | Status: DC | PRN
  Administered 2023-01-01: 1 mL via INTRAOCULAR

## 2023-01-01 MED ORDER — ARMC OPHTHALMIC DILATING DROPS
1.0000 | OPHTHALMIC | Status: DC | PRN
  Administered 2023-01-01 (×3): 1 via OPHTHALMIC

## 2023-01-01 MED ORDER — TETRACAINE HCL 0.5 % OP SOLN
1.0000 [drp] | OPHTHALMIC | Status: DC | PRN
  Administered 2023-01-01 (×3): 1 [drp] via OPHTHALMIC

## 2023-01-01 SURGICAL SUPPLY — 13 items
CATARACT SUITE SIGHTPATH (MISCELLANEOUS) ×1 IMPLANT
DISSECTOR HYDRO NUCLEUS 50X22 (MISCELLANEOUS) ×1 IMPLANT
FEE CATARACT SUITE SIGHTPATH (MISCELLANEOUS) ×1 IMPLANT
GLOVE SURG GAMMEX PI TX LF 7.5 (GLOVE) ×1 IMPLANT
GLOVE SURG SYN 8.5  E (GLOVE) ×1
GLOVE SURG SYN 8.5 E (GLOVE) ×1 IMPLANT
GLOVE SURG SYN 8.5 PF PI (GLOVE) ×1 IMPLANT
LENS IOL TECNIS EYHANCE 13.0 (Intraocular Lens) IMPLANT
NDL FILTER BLUNT 18X1 1/2 (NEEDLE) ×1 IMPLANT
NEEDLE FILTER BLUNT 18X1 1/2 (NEEDLE) ×1 IMPLANT
SYR 3ML LL SCALE MARK (SYRINGE) ×1 IMPLANT
SYR 5ML LL (SYRINGE) ×1 IMPLANT
WATER STERILE IRR 250ML POUR (IV SOLUTION) ×1 IMPLANT

## 2023-01-01 NOTE — Anesthesia Postprocedure Evaluation (Signed)
Anesthesia Post Note  Patient: Maria Bentley  Procedure(s) Performed: CATARACT EXTRACTION PHACO AND INTRAOCULAR LENS PLACEMENT (IOC) RIGHT DIABETIC (Right: Eye)  Patient location during evaluation: PACU Anesthesia Type: MAC Level of consciousness: awake and alert, oriented and patient cooperative Pain management: pain level controlled Vital Signs Assessment: post-procedure vital signs reviewed and stable Respiratory status: spontaneous breathing, nonlabored ventilation and respiratory function stable Cardiovascular status: blood pressure returned to baseline and stable Postop Assessment: adequate PO intake Anesthetic complications: no   No notable events documented.   Last Vitals:  Vitals:   01/01/23 1231 01/01/23 1237  BP: (!) 131/103 136/69  Pulse: 72 69  Resp: 20 (!) 9  Temp: (!) 36.3 C (!) 36.3 C  SpO2: 96% 97%    Last Pain:  Vitals:   01/01/23 1237  TempSrc:   PainSc: 0-No pain                 Darrin Nipper

## 2023-01-01 NOTE — Anesthesia Preprocedure Evaluation (Addendum)
Anesthesia Evaluation  Patient identified by MRN, date of birth, ID band Patient awake    Reviewed: Allergy & Precautions, NPO status , Patient's Chart, lab work & pertinent test results  History of Anesthesia Complications Negative for: history of anesthetic complications  Airway Mallampati: IV   Neck ROM: Full    Dental  (+) Missing   Pulmonary sleep apnea , former smoker (quit 1997)   Pulmonary exam normal breath sounds clear to auscultation       Cardiovascular hypertension, + CAD  Normal cardiovascular exam Rhythm:Regular Rate:Normal     Neuro/Psych  Headaches PSYCHIATRIC DISORDERS Anxiety Depression       GI/Hepatic S/p gastric bypass 2010   Endo/Other  diabetes, Type 2, Insulin DependentHypothyroidism  Obesity   Renal/GU Renal disease (stage III CKD)     Musculoskeletal  (+) Arthritis ,  Fibromyalgia -  Abdominal   Peds  Hematology  (+) Blood dyscrasia, anemia   Anesthesia Other Findings   Reproductive/Obstetrics                             Anesthesia Physical Anesthesia Plan  ASA: 3  Anesthesia Plan: MAC   Post-op Pain Management:    Induction: Intravenous  PONV Risk Score and Plan: 2 and Treatment may vary due to age or medical condition, Midazolam and TIVA  Airway Management Planned: Natural Airway and Nasal Cannula  Additional Equipment:   Intra-op Plan:   Post-operative Plan:   Informed Consent: I have reviewed the patients History and Physical, chart, labs and discussed the procedure including the risks, benefits and alternatives for the proposed anesthesia with the patient or authorized representative who has indicated his/her understanding and acceptance.     Dental advisory given  Plan Discussed with: CRNA  Anesthesia Plan Comments: (LMA/GETA backup discussed.  Patient consented for risks of anesthesia including but not limited to:  - adverse  reactions to medications - damage to eyes, teeth, lips or other oral mucosa - nerve damage due to positioning  - sore throat or hoarseness - damage to heart, brain, nerves, lungs, other parts of body or loss of life  Informed patient about role of CRNA in peri- and intra-operative care.  Patient voiced understanding.)        Anesthesia Quick Evaluation

## 2023-01-01 NOTE — H&P (Signed)
[facility]   Primary Care Physician:  [name], MD Ophthalmologist: Dr. Benay Pillow  Pre-Procedure History & Physical: HPI:  Maria Bentley is a 52 y.o. female here for cataract surgery.   Past Medical History:  Diagnosis Date   Abscess of right buttock 04/30/2019   Anemia    Anxiety    Arthritis    CKD (chronic kidney disease), stage III ([place])    COVID-19 virus infection    Depression 2010   Diabetes mellitus    DKA, type 2 ([place]) 10/13/2019   Dysrhythmia    Endometriosis    Family history of adverse reaction to anesthesia    Mother - heart stopped during several procedures using propofol   Fibromyalgia    Headache, common migraine, intractable    History of kidney stones    History of ovarian cyst    left sided, caused by her endometriosis (possibly an endometrioma), in her 53s.   Hyperlipidemia    Hypertension    Hypothyroidism    Kidney stones    left   Morbid obesity ([place])    s/p gastric bypass   Motion sickness    long car rides   Personal hx of gastric bypass 2010   Rosacea    Vertigo     Past Surgical History:  Procedure Laterality Date   CARPAL TUNNEL RELEASE Right 11/26/2020   Procedure: CARPAL TUNNEL RELEASE;  Surgeon: Hessie Knows, MD;  Location: [facility] ORS;  Service: Orthopedics;  Laterality: Right;   COLONOSCOPY WITH PROPOFOL N/A 10/21/2020   Procedure: COLONOSCOPY WITH PROPOFOL;  Surgeon: Virgel Manifold, MD;  Location: [facility] ENDOSCOPY;  Service: Endoscopy;  Laterality: N/A;   COLONOSCOPY WITH PROPOFOL N/A 09/15/2021   Procedure: COLONOSCOPY WITH PROPOFOL;  Surgeon: Virgel Manifold, MD;  Location: [facility] ENDOSCOPY;  Service: Endoscopy;  Laterality: N/A;   DILATION AND CURETTAGE OF UTERUS  2011   gastric bypass     OPEN REDUCTION INTERNAL FIXATION (ORIF) DISTAL RADIAL FRACTURE Right 11/26/2020   Procedure: Open reduction internal fixation, right distal radius;  Surgeon: Hessie Knows, MD;  Location: [facility] ORS;  Service:  Orthopedics;  Laterality: Right;   OVARIAN CYST REMOVAL     ROTATOR CUFF REPAIR     right   SHOULDER ARTHROSCOPY WITH SUBACROMIAL DECOMPRESSION AND OPEN ROTATOR C Right 07/19/2020   Procedure: Right shoulder arthroscopic vs mini-open rotator cuff repair vs Regeneten patch application, subacomial dcompression, and biceps tenodesis;  Surgeon: Leim Fabry, MD;  Location: [facility] OR[facility];  Service: Orthopedics;  Laterality: Right;    Prior to Admission medications   Medication Sig Start Date End Date Taking? Authorizing Provider  aspirin-acetaminophen-caffeine (EXCEDRIN MIGRAINE) [phone] MG tablet Take 2 tablets by mouth every 6 (six) hours as needed for headache.   Yes [provider]  atorvastatin (LIPITOR) 20 MG tablet Take 1 tablet (20 mg total) by mouth daily. 06/05/22  Yes [name], MD  buPROPion (WELLBUTRIN XL) 150 MG 24 hr tablet Take 450 mg by mouth daily.  05/12/18  Yes [provider]  CLINPRO 5000 1.1 % PSTE Place 1 application onto teeth at bedtime.  06/15/20  Yes [provider]  cyclobenzaprine (FLEXERIL) 10 MG tablet Take 0.5-1 tablets (5-10 mg total) by mouth 3 (three) times daily as needed for muscle spasms. 07/07/21  Yes [name], MD  diphenhydrAMINE (BENADRYL) 25 MG tablet Take 25 mg by mouth every 6 (six) hours as needed for allergies.   Yes [provider]  EPINEPHrine 0.3  mg/0.3 mL IJ SOAJ injection Inject 0.3 mg into the muscle as needed for anaphylaxis.  06/25/10  Yes [provider]  escitalopram (LEXAPRO) 20 MG tablet Take 20 mg by mouth daily.  02/27/17  Yes [provider]  fluticasone (FLONASE) 50 MCG/ACT nasal spray SPRAY 2 SPRAYS INTO EACH NOSTRIL EVERY DAY 04/21/21  Yes [name], MD  HORIZANT 600 MG TBCR TAKE 1 TABLET BY MOUTH TWICE A DAY 11/17/22  Yes [name], MD  insulin aspart (NOVOLOG FLEXPEN) 100 UNIT/ML FlexPen INJECT 20 UNITS INTO THE SKIN 3 (THREE) TIMES DAILY WITH MEALS. 12/18/22  Yes [name], MD  insulin glargine (LANTUS SOLOSTAR) 100 UNIT/ML Solostar Pen Inject 45 Units into the skin daily. 12/19/22  Yes [name], MD  lamoTRIgine (LAMICTAL) 150 MG tablet Take 150 mg by mouth daily.   Yes [provider]  levothyroxine (SYNTHROID) 50 MCG tablet TAKE 1 TABLET BY MOUTH EVERY DAY 09/14/22  Yes [name], MD  lipase/protease/amylase (CREON) 36000 UNITS CPEP capsule TAKE 2 CAPSULES BY MOUTH 3 (THREE) TIMES DAILY BEFORE MEALS AND 1 CAPSULE WITH SNACKS. 05/10/22  Yes Vanga, Tally Due, MD  LORazepam (ATIVAN) 0.5 MG tablet Take 0.5-1 mg by mouth daily as needed for anxiety.    Yes [provider]  losartan (COZAAR) 25 MG tablet Take 1 tablet (25 mg total) by mouth daily. 06/05/22 06/05/23 Yes [name], MD  meclizine (ANTIVERT) 25 MG tablet Take 1 tablet (25 mg total) by mouth 3 (three) times daily as needed for dizziness. 06/16/15  Yes [name], MD  metoprolol tartrate (LOPRESSOR) 25 MG tablet TAKE 0.5 TABLETS BY MOUTH 2 TIMES DAILY. 08/20/22  Yes Webb, Padonda B, FNP  NURTEC 75 MG TBDP Take 1 tablet by mouth daily as needed. 11/25/21  Yes [provider]  OnabotulinumtoxinA (BOTOX IJ) Inject as directed every 3 (three) months. For migraine   Yes [provider]  ondansetron (ZOFRAN ODT) 4 MG disintegrating tablet Take 1 tablet (4 mg total) by mouth every 8 (eight) hours as needed for nausea or vomiting. 10/03/21  Yes Blake Divine, MD  albuterol (VENTOLIN HFA) 108 (90 Base) MCG/ACT inhaler Inhale 2 puffs into the lungs every 6 (six) hours as needed for wheezing or shortness of breath. 10/02/19   Terald Sleeper, PA-C  amitriptyline (ELAVIL) 25 MG tablet TAKE 1 TABLET BY MOUTH EVERYDAY AT BEDTIME Patient not taking: Reported on 12/26/2022 11/06/22   [name], MD  Azelastine HCl 137 MCG/SPRAY SOLN SMARTSIG:1-2 Spray(s) Both Nares Twice Daily Patient not taking: Reported on 12/26/2022 11/22/21   [provider]   Continuous Blood Gluc Receiver (FREESTYLE LIBRE 14 DAY READER) DEVI 1 applicator by Does not apply route 4 (four) times daily. 11/24/17   [name], MD  Continuous Blood Gluc Sensor (FREESTYLE LIBRE 14 DAY SENSOR) MISC USE TO CHECK BLOOD SUGAR FOUR TIMES A DAY AS NEEDED 12/23/21   [name], MD  cyanocobalamin (,VITAMIN B-12,) 1000 MCG/ML injection INJECT 1 ML (1,000 MCG TOTAL) INTO THE MUSCLE ONCE A WEEK. FOR 4 WEEKS, THEN MONTHLY THEREAFTER Patient not taking: Reported on 12/26/2022 11/23/21   [name], MD  furosemide (LASIX) 20 MG tablet TAKE 1 TABLET (20 MG TOTAL) BY MOUTH DAILY. AS NEEDED FOR FLUID RETENTION Patient not taking: Reported on 12/26/2022 02/01/21   [name], MD  Insulin Pen Needle (BD PEN NEEDLE NANO U/F) 32G X 4 MM MISC Please specify directions,  refills and quantity 05/31/22   [name], MD  Melatonin 10 MG TABS Take 10 mg by mouth at bedtime as needed (sleep).  Patient not taking: Reported on 12/26/2022    [provider]  nystatin (MYCOSTATIN/NYSTOP) powder APPLY 1 APPLICATION TO AFFECTED AREA TWICE A DAY 12/23/20   Leone Haven, MD  SYRINGE-NEEDLE, DISP, 3 ML (BD INTEGRA SYRINGE) 25G X 1" 3 ML MISC FOR USE WITH B12 INJECTIONS WEEKLY/MONTHLY 08/01/18   [name], MD    Allergies as of 12/22/2022 - Review Complete 10/15/2022  Allergen Reaction Noted   Amoxicillin Hives, Rash, Itching, and Swelling 02/05/2012   Avocado Swelling 05/29/2015   Citrullus vulgaris Hives and Swelling 04/02/2012   Clarithromycin Hives and Rash 02/05/2012   Clarithromycin Anaphylaxis, Hives, and Rash 02/05/2012   Honey Anaphylaxis 04/02/2012   Other Hives and Swelling 05/29/2015   Sulfa antibiotics Anaphylaxis 02/05/2012   Sulfa drugs cross reactors Anaphylaxis 02/05/2012   Watermelon concentrate Hives and Swelling 04/02/2012   Charentais melon (french melon)  04/02/2012   Insulin detemir Rash 96/22/2979   Silicone Itching and Rash 04/02/2012    Tape Itching and Rash 04/02/2012   Victoza [liraglutide] Nausea Only 06/11/2018    Family History  Problem Relation Age of Onset   Diabetes Mother    Hypertension Mother    Hypertension Father    Diabetes Father    Depression Other        strong hx of   Breast cancer Paternal Grandmother 40   Breast cancer Maternal Grandmother    Cancer Neg Hx     Social History   Socioeconomic History   Marital status: Married    Spouse name: Not on file   Number of children: Not on file   Years of education: Not on file   Highest education level: Not on file  Occupational History   Not on file  Tobacco Use   Smoking status: Former    Packs/day: 1.00    Years: 4.00    Total pack years: 4.00    Types: Cigarettes    Quit date: 02/05/1996    Years since quitting: 26.9   Smokeless tobacco: Never  Vaping Use   Vaping Use: Never used  Substance and Sexual Activity   Alcohol use: No   Drug use: No   Sexual activity: Yes    Birth control/protection: None  Other Topics Concern   Not on file  Social History Narrative   Not on file   Social Determinants of Health   Financial Resource Strain: Not on file  Food Insecurity: Not on file  Transportation Needs: Not on file  Physical Activity: Not on file  Stress: Not on file  Social Connections: Not on file  Intimate Partner Violence: Not on file    Review of Systems: See HPI, otherwise negative ROS  Physical Exam: BP (!) 149/70   Temp (!) 97.3 F (36.3 C) (Tympanic)   Ht 5' 4.02" (1.626 m)   Wt 101.6 kg   LMP 05/09/2016 (LMP Unknown) Comment: D/C  SpO2 96%   BMI 38.43 kg/m  General:   Alert, cooperative in NAD Head:  Normocephalic and atraumatic. Respiratory:  Normal work of breathing. Cardiovascular:  RRR  Impression/Plan: Maria Bentley is here for cataract surgery.  Risks, benefits, limitations, and alternatives regarding cataract surgery have been reviewed with the patient.  Questions have been answered.  All  parties agreeable.   Benay Pillow, MD  01/01/2023, 12:03 PM

## 2023-01-01 NOTE — Transfer of Care (Signed)
Immediate Anesthesia Transfer of Care Note  Patient: Maria Bentley  Procedure(s) Performed: CATARACT EXTRACTION PHACO AND INTRAOCULAR LENS PLACEMENT (IOC) RIGHT DIABETIC (Right: Eye)  Patient Location: PACU  Anesthesia Type: MAC  Level of Consciousness: awake, alert  and patient cooperative  Airway and Oxygen Therapy: Patient Spontanous Breathing and Patient connected to supplemental oxygen  Post-op Assessment: Post-op Vital signs reviewed, Patient's Cardiovascular Status Stable, Respiratory Function Stable, Patent Airway and No signs of Nausea or vomiting  Post-op Vital Signs: Reviewed and stable  Complications: No notable events documented.

## 2023-01-01 NOTE — Op Note (Signed)
OPERATIVE NOTE  Maria Bentley [id number] 01/01/2023   PREOPERATIVE DIAGNOSIS:  Nuclear sclerotic cataract right eye.  H25.11   POSTOPERATIVE DIAGNOSIS:    Nuclear sclerotic cataract right eye.     PROCEDURE:  Phacoemusification with posterior chamber intraocular lens placement of the right eye   LENS:  * No implants in log *     Procedure(s) with comments: CATARACT EXTRACTION PHACO AND INTRAOCULAR LENS PLACEMENT (IOC) RIGHT DIABETIC (Right) - 10.12 053.5  DIB00 +13.0   ULTRASOUND TIME: 0 minutes 53 seconds.  CDE 10.12   SURGEON:  Benay Pillow, MD, MPH  ANESTHESIOLOGIST: Anesthesiologist: Darrin Nipper, MD CRNA: Hilbert Odor, CRNA   ANESTHESIA:  Topical with tetracaine drops augmented with 1% preservative-free intracameral lidocaine.  ESTIMATED BLOOD LOSS: less than 1 mL.   COMPLICATIONS:  None.   DESCRIPTION OF PROCEDURE:  The patient was identified in the holding room and transported to the operating room and placed in the supine position under the operating microscope.  The right eye was identified as the operative eye and it was prepped and draped in the usual sterile ophthalmic fashion.   A 1.0 millimeter clear-corneal paracentesis was made at the 10:30 position. 0.5 ml of preservative-free 1% lidocaine with epinephrine was injected into the anterior chamber.  The anterior chamber was filled with viscoelastic.  A 2.4 millimeter keratome was used to make a near-clear corneal incision at the 8:00 position.  A curvilinear capsulorrhexis was made with a cystotome and capsulorrhexis forceps.  Balanced salt solution was used to hydrodissect and hydrodelineate the nucleus.   Phacoemulsification was then used in stop and chop fashion to remove the lens nucleus and epinucleus.  The remaining cortex was then removed using the irrigation and aspiration handpiece. Viscoelastic was then placed into the capsular bag to distend it for lens placement.  A lens was then injected  into the capsular bag.  The remaining viscoelastic was aspirated.   Wounds were hydrated with balanced salt solution.  The anterior chamber was inflated to a physiologic pressure with balanced salt solution.   Intracameral vigamox 0.1 mL undiluted was injected into the eye and a drop placed onto the ocular surface.  No wound leaks were noted.  The patient was taken to the recovery room in stable condition without complications of anesthesia or surgery  Benay Pillow 01/01/2023, 12:30 PM

## 2023-01-04 ENCOUNTER — Encounter: Payer: Self-pay | Admitting: Ophthalmology

## 2023-01-08 ENCOUNTER — Encounter: Payer: Self-pay | Admitting: Ophthalmology

## 2023-01-09 ENCOUNTER — Other Ambulatory Visit: Payer: Self-pay | Admitting: Internal Medicine

## 2023-01-11 NOTE — Discharge Instructions (Signed)

## 2023-01-15 ENCOUNTER — Encounter
Admission: RE | Disposition: A | Discharge status: Home / Self Care | Payer: Self-pay | Source: Ambulatory Visit | Attending: Ophthalmology

## 2023-01-15 ENCOUNTER — Encounter: Payer: Self-pay | Admitting: Ophthalmology

## 2023-01-15 ENCOUNTER — Other Ambulatory Visit: Payer: Self-pay

## 2023-01-15 ENCOUNTER — Ambulatory Visit: Payer: BC Managed Care – PPO | Admitting: Anesthesiology

## 2023-01-15 ENCOUNTER — Ambulatory Visit
Admission: RE | Admit: 2023-01-15 | Discharge: 2023-01-15 | Disposition: A | Discharge status: Home / Self Care | Payer: BC Managed Care – PPO | Source: Ambulatory Visit | Attending: Ophthalmology | Admitting: Ophthalmology

## 2023-01-15 DIAGNOSIS — Z9884 Bariatric surgery status: Secondary | ICD-10-CM | POA: Diagnosis not present

## 2023-01-15 DIAGNOSIS — Z6838 Body mass index (BMI) 38.0-38.9, adult: Secondary | ICD-10-CM | POA: Diagnosis not present

## 2023-01-15 DIAGNOSIS — E1122 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 disease: Secondary | ICD-10-CM | POA: Insufficient documentation

## 2023-01-15 DIAGNOSIS — Z794 Long term (current) use of insulin: Secondary | ICD-10-CM | POA: Insufficient documentation

## 2023-01-15 DIAGNOSIS — D649 Anemia, unspecified: Secondary | ICD-10-CM | POA: Insufficient documentation

## 2023-01-15 DIAGNOSIS — H2512 Age-related nuclear cataract, left eye: Secondary | ICD-10-CM | POA: Diagnosis present

## 2023-01-15 DIAGNOSIS — M1990 Unspecified osteoarthritis, unspecified site: Secondary | ICD-10-CM | POA: Insufficient documentation

## 2023-01-15 DIAGNOSIS — G4730 Sleep apnea, unspecified: Secondary | ICD-10-CM | POA: Diagnosis not present

## 2023-01-15 DIAGNOSIS — E669 Obesity, unspecified: Secondary | ICD-10-CM | POA: Insufficient documentation

## 2023-01-15 DIAGNOSIS — D759 Disease of blood and blood-forming organs, unspecified: Secondary | ICD-10-CM | POA: Insufficient documentation

## 2023-01-15 DIAGNOSIS — M797 Fibromyalgia: Secondary | ICD-10-CM | POA: Diagnosis not present

## 2023-01-15 DIAGNOSIS — N183 Chronic kidney disease, stage 3 unspecified: Secondary | ICD-10-CM | POA: Insufficient documentation

## 2023-01-15 DIAGNOSIS — Z87891 Personal history of nicotine dependence: Secondary | ICD-10-CM | POA: Diagnosis not present

## 2023-01-15 DIAGNOSIS — G43909 Migraine, unspecified, not intractable, without status migrainosus: Secondary | ICD-10-CM | POA: Insufficient documentation

## 2023-01-15 DIAGNOSIS — F418 Other specified anxiety disorders: Secondary | ICD-10-CM | POA: Diagnosis not present

## 2023-01-15 DIAGNOSIS — I129 Hypertensive chronic kidney disease with stage 1 through stage 4 chronic kidney disease, or unspecified chronic kidney disease: Secondary | ICD-10-CM | POA: Insufficient documentation

## 2023-01-15 DIAGNOSIS — E039 Hypothyroidism, unspecified: Secondary | ICD-10-CM | POA: Diagnosis not present

## 2023-01-15 HISTORY — PX: CATARACT EXTRACTION W/PHACO: SHX586

## 2023-01-15 LAB — GLUCOSE, CAPILLARY: Glucose-Capillary: 192 mg/dL — ABNORMAL HIGH (ref 70–99)

## 2023-01-15 SURGERY — PHACOEMULSIFICATION, CATARACT, WITH IOL INSERTION
Anesthesia: Monitor Anesthesia Care | Site: Eye | Laterality: Left

## 2023-01-15 MED ORDER — MIDAZOLAM HCL 2 MG/2ML IJ SOLN
INTRAMUSCULAR | Status: DC | PRN
  Administered 2023-01-15 (×2): 1 mg via INTRAVENOUS

## 2023-01-15 MED ORDER — SIGHTPATH DOSE#1 BSS IO SOLN
INTRAOCULAR | Status: DC | PRN
  Administered 2023-01-15: 15 mL via INTRAOCULAR

## 2023-01-15 MED ORDER — LACTATED RINGERS IV SOLN: INTRAVENOUS | Status: DC

## 2023-01-15 MED ORDER — ACETAMINOPHEN 325 MG PO TABS: 650.0000 mg | ORAL_TABLET | Freq: Once | ORAL | Status: DC

## 2023-01-15 MED ORDER — SIGHTPATH DOSE#1 BSS IO SOLN
INTRAOCULAR | Status: DC | PRN
  Administered 2023-01-15: 93 mL via OPHTHALMIC

## 2023-01-15 MED ORDER — TETRACAINE HCL 0.5 % OP SOLN
1.0000 [drp] | OPHTHALMIC | Status: DC | PRN
  Administered 2023-01-15 (×3): 1 [drp] via OPHTHALMIC

## 2023-01-15 MED ORDER — ARMC OPHTHALMIC DILATING DROPS
1.0000 | OPHTHALMIC | Status: DC | PRN
  Administered 2023-01-15 (×3): 1 via OPHTHALMIC

## 2023-01-15 MED ORDER — FENTANYL CITRATE (PF) 100 MCG/2ML IJ SOLN
INTRAMUSCULAR | Status: DC | PRN
  Administered 2023-01-15: 25 ug via INTRAVENOUS

## 2023-01-15 MED ORDER — SIGHTPATH DOSE#1 NA HYALUR & NA CHOND-NA HYALUR IO KIT
PACK | INTRAOCULAR | Status: DC | PRN
  Administered 2023-01-15: 1 via OPHTHALMIC

## 2023-01-15 MED ORDER — ACETAMINOPHEN 325 MG PO TABS
650.0000 mg | ORAL_TABLET | Freq: Four times a day (QID) | ORAL | Status: DC | PRN
  Administered 2023-01-15: 650 mg via ORAL

## 2023-01-15 MED ORDER — MOXIFLOXACIN HCL 0.5 % OP SOLN
OPHTHALMIC | Status: DC | PRN
  Administered 2023-01-15: .2 mL via OPHTHALMIC

## 2023-01-15 MED ORDER — LIDOCAINE HCL (PF) 2 % IJ SOLN
INTRAOCULAR | Status: DC | PRN
  Administered 2023-01-15: 4 mL via INTRAOCULAR

## 2023-01-15 SURGICAL SUPPLY — 15 items
CANNULA ANT/CHMB 27G (MISCELLANEOUS) IMPLANT
CANNULA ANT/CHMB 27GA (MISCELLANEOUS) IMPLANT
CATARACT SUITE SIGHTPATH (MISCELLANEOUS) ×1 IMPLANT
DISSECTOR HYDRO NUCLEUS 50X22 (MISCELLANEOUS) ×1 IMPLANT
FEE CATARACT SUITE SIGHTPATH (MISCELLANEOUS) ×1 IMPLANT
GLOVE SURG GAMMEX PI TX LF 7.5 (GLOVE) ×1 IMPLANT
GLOVE SURG SYN 8.5  E (GLOVE) ×1
GLOVE SURG SYN 8.5 E (GLOVE) ×1 IMPLANT
GLOVE SURG SYN 8.5 PF PI (GLOVE) ×1 IMPLANT
LENS IOL TECNIS EYHANCE 14.0 (Intraocular Lens) IMPLANT
NDL FILTER BLUNT 18X1 1/2 (NEEDLE) ×1 IMPLANT
NEEDLE FILTER BLUNT 18X1 1/2 (NEEDLE) ×1 IMPLANT
SYR 3ML LL SCALE MARK (SYRINGE) ×1 IMPLANT
SYR 5ML LL (SYRINGE) ×1 IMPLANT
WATER STERILE IRR 250ML POUR (IV SOLUTION) ×1 IMPLANT

## 2023-01-15 NOTE — Anesthesia Preprocedure Evaluation (Signed)
Anesthesia Evaluation  Patient identified by MRN, date of birth, ID band Patient awake    Reviewed: Allergy & Precautions, NPO status , Patient's Chart, lab work & pertinent test results  History of Anesthesia Complications Negative for: history of anesthetic complications  Airway Mallampati: IV   Neck ROM: Full    Dental  (+) Missing   Pulmonary sleep apnea , former smoker   Pulmonary exam normal breath sounds clear to auscultation       Cardiovascular hypertension, + CAD  Normal cardiovascular exam Rhythm:Regular Rate:Normal     Neuro/Psych  Headaches PSYCHIATRIC DISORDERS Anxiety Depression       GI/Hepatic S/p gastric bypass 2010   Endo/Other  diabetes, Type 2, Insulin DependentHypothyroidism  Obesity   Renal/GU Renal disease (stage III CKD)     Musculoskeletal  (+) Arthritis ,  Fibromyalgia -  Abdominal   Peds  Hematology  (+) Blood dyscrasia, anemia   Anesthesia Other Findings   Reproductive/Obstetrics                              Anesthesia Physical Anesthesia Plan  ASA: 3  Anesthesia Plan: MAC   Post-op Pain Management:    Induction: Intravenous  PONV Risk Score and Plan: 2 and Treatment may vary due to age or medical condition, Midazolam and TIVA  Airway Management Planned: Natural Airway and Nasal Cannula  Additional Equipment:   Intra-op Plan:   Post-operative Plan:   Informed Consent: I have reviewed the patients History and Physical, chart, labs and discussed the procedure including the risks, benefits and alternatives for the proposed anesthesia with the patient or authorized representative who has indicated his/her understanding and acceptance.     Dental advisory given  Plan Discussed with: CRNA  Anesthesia Plan Comments: (LMA/GETA backup discussed.  Patient consented for risks of anesthesia including but not limited to:  - adverse reactions to  medications - damage to eyes, teeth, lips or other oral mucosa - nerve damage due to positioning  - sore throat or hoarseness - damage to heart, brain, nerves, lungs, other parts of body or loss of life  Informed patient about role of CRNA in peri- and intra-operative care.  Patient voiced understanding.)         Anesthesia Quick Evaluation

## 2023-01-15 NOTE — Anesthesia Postprocedure Evaluation (Signed)
Anesthesia Post Note  Patient: Maria Bentley  Procedure(s) Performed: CATARACT EXTRACTION PHACO AND INTRAOCULAR LENS PLACEMENT (IOC) LEFT DIABETIC 8.99 00:49.5 (Left: Eye)  Patient location during evaluation: PACU Anesthesia Type: MAC Level of consciousness: awake and alert Pain management: pain level controlled Vital Signs Assessment: post-procedure vital signs reviewed and stable Respiratory status: spontaneous breathing, nonlabored ventilation, respiratory function stable and patient connected to nasal cannula oxygen Cardiovascular status: stable and blood pressure returned to baseline Postop Assessment: no apparent nausea or vomiting Anesthetic complications: no   No notable events documented.   Last Vitals:  Vitals:   01/15/23 0955 01/15/23 1000  BP: 111/69 99/67  Pulse: 78 77  Resp: 16 20  Temp: (!) 36.2 C (!) 36.2 C  SpO2: 97% 97%    Last Pain:  Vitals:   01/15/23 1000  TempSrc:   PainSc: 2                  Martha Clan

## 2023-01-15 NOTE — H&P (Signed)
[facility]   Primary Care Physician:  [name], MD Ophthalmologist: Dr. Benay Pillow  Pre-Procedure History & Physical: HPI:  Maria Bentley is a 52 y.o. female here for cataract surgery.   Past Medical History:  Diagnosis Date   Abscess of right buttock 04/30/2019   Anemia    Anxiety    Arthritis    CKD (chronic kidney disease), stage III ([place])    COVID-19 virus infection    Depression 2010   Diabetes mellitus    DKA, type 2 ([place]) 10/13/2019   Dysrhythmia    Endometriosis    Family history of adverse reaction to anesthesia    Mother - heart stopped during several procedures using propofol   Fibromyalgia    Headache, common migraine, intractable    History of kidney stones    History of ovarian cyst    left sided, caused by her endometriosis (possibly an endometrioma), in her 67s.   Hyperlipidemia    Hypertension    Hypothyroidism    Kidney stones    left   Morbid obesity ([place])    s/p gastric bypass   Motion sickness    long car rides   Personal hx of gastric bypass 2010   Rosacea    Vertigo     Past Surgical History:  Procedure Laterality Date   CARPAL TUNNEL RELEASE Right 11/26/2020   Procedure: CARPAL TUNNEL RELEASE;  Surgeon: Hessie Knows, MD;  Location: [facility];  Service: Orthopedics;  Laterality: Right;   CATARACT EXTRACTION W/PHACO Right 01/01/2023   Procedure: CATARACT EXTRACTION PHACO AND INTRAOCULAR LENS PLACEMENT ([place]) RIGHT DIABETIC;  Surgeon: Eulogio Bear, MD;  Location: [place];  Service: Ophthalmology;  Laterality: Right;  10.12 053.5   COLONOSCOPY WITH PROPOFOL N/A 10/21/2020   Procedure: COLONOSCOPY WITH PROPOFOL;  Surgeon: Virgel Manifold, MD;  Location: [facility];  Service: Endoscopy;  Laterality: N/A;   COLONOSCOPY WITH PROPOFOL N/A 09/15/2021   Procedure: COLONOSCOPY WITH PROPOFOL;  Surgeon: Virgel Manifold, MD;  Location: [facility];  Service: Endoscopy;  Laterality: N/A;   DILATION  AND CURETTAGE OF UTERUS  2011   gastric bypass     OPEN REDUCTION INTERNAL FIXATION (ORIF) DISTAL RADIAL FRACTURE Right 11/26/2020   Procedure: Open reduction internal fixation, right distal radius;  Surgeon: Hessie Knows, MD;  Location: [facility];  Service: Orthopedics;  Laterality: Right;   OVARIAN CYST REMOVAL     ROTATOR CUFF REPAIR     right   SHOULDER ARTHROSCOPY WITH SUBACROMIAL DECOMPRESSION AND OPEN ROTATOR C Right 07/19/2020   Procedure: Right shoulder arthroscopic vs mini-open rotator cuff repair vs Regeneten patch application, subacomial dcompression, and biceps tenodesis;  Surgeon: Leim Fabry, MD;  Location: [facility];  Service: Orthopedics;  Laterality: Right;    Prior to Admission medications   Medication Sig Start Date End Date Taking? Authorizing Provider  albuterol (VENTOLIN HFA) 108 (90 Base) MCG/ACT inhaler Inhale 2 puffs into the lungs every 6 (six) hours as needed for wheezing or shortness of breath. 10/02/19  Yes Terald Sleeper, PA-C  aspirin-acetaminophen-caffeine (EXCEDRIN MIGRAINE) [phone] MG tablet Take 2 tablets by mouth every 6 (six) hours as needed for headache.   Yes [provider]  atorvastatin (LIPITOR) 20 MG tablet TAKE 1 TABLET BY MOUTH EVERY DAY 01/09/23  Yes [name], MD  buPROPion (WELLBUTRIN XL) 150 MG 24 hr tablet Take 450 mg by mouth daily.  05/12/18  Yes [provider]  CLINPRO 5000 1.1 % PSTE  Place 1 application onto teeth at bedtime.  06/15/20  Yes [provider]  cyclobenzaprine (FLEXERIL) 10 MG tablet Take 0.5-1 tablets (5-10 mg total) by mouth 3 (three) times daily as needed for muscle spasms. 07/07/21  Yes [name], MD  diphenhydrAMINE (BENADRYL) 25 MG tablet Take 25 mg by mouth every 6 (six) hours as needed for allergies.   Yes [provider]  EPINEPHrine 0.3 mg/0.3 mL IJ SOAJ injection Inject 0.3 mg into the muscle as needed for anaphylaxis.  06/25/10  Yes [provider]  escitalopram  (LEXAPRO) 20 MG tablet Take 20 mg by mouth daily.  02/27/17  Yes [provider]  fluticasone (FLONASE) 50 MCG/ACT nasal spray SPRAY 2 SPRAYS INTO EACH NOSTRIL EVERY DAY 04/21/21  Yes [name], MD  HORIZANT 600 MG TBCR TAKE 1 TABLET BY MOUTH TWICE A DAY 11/17/22  Yes [name], MD  insulin aspart (NOVOLOG FLEXPEN) 100 UNIT/ML FlexPen INJECT 20 UNITS INTO THE SKIN 3 (THREE) TIMES DAILY WITH MEALS. 12/18/22  Yes [name], MD  insulin glargine (LANTUS SOLOSTAR) 100 UNIT/ML Solostar Pen Inject 45 Units into the skin daily. 12/19/22  Yes [name], MD  lamoTRIgine (LAMICTAL) 150 MG tablet Take 150 mg by mouth daily.   Yes [provider]  levothyroxine (SYNTHROID) 50 MCG tablet TAKE 1 TABLET BY MOUTH EVERY DAY 09/14/22  Yes [name], MD  lipase/protease/amylase (CREON) 36000 UNITS CPEP capsule TAKE 2 CAPSULES BY MOUTH 3 (THREE) TIMES DAILY BEFORE MEALS AND 1 CAPSULE WITH SNACKS. 05/10/22  Yes Vanga, Tally Due, MD  LORazepam (ATIVAN) 0.5 MG tablet Take 0.5-1 mg by mouth daily as needed for anxiety.    Yes [provider]  losartan (COZAAR) 25 MG tablet Take 1 tablet (25 mg total) by mouth daily. 06/05/22 06/05/23 Yes [name], MD  meclizine (ANTIVERT) 25 MG tablet Take 1 tablet (25 mg total) by mouth 3 (three) times daily as needed for dizziness. 06/16/15  Yes [name], MD  metoprolol tartrate (LOPRESSOR) 25 MG tablet TAKE 0.5 TABLETS BY MOUTH 2 TIMES DAILY. 08/20/22  Yes Webb, Padonda B, FNP  NURTEC 75 MG TBDP Take 1 tablet by mouth daily as needed. 11/25/21  Yes [provider]  OnabotulinumtoxinA (BOTOX IJ) Inject as directed every 3 (three) months. For migraine   Yes [provider]  ondansetron (ZOFRAN ODT) 4 MG disintegrating tablet Take 1 tablet (4 mg total) by mouth every 8 (eight) hours as needed for nausea or vomiting. 10/03/21  Yes Blake Divine, MD  amitriptyline (ELAVIL) 25 MG tablet TAKE 1 TABLET BY MOUTH  EVERYDAY AT BEDTIME Patient not taking: Reported on 12/26/2022 11/06/22   [name], MD  Azelastine HCl 137 MCG/SPRAY SOLN SMARTSIG:1-2 Spray(s) Both Nares Twice Daily Patient not taking: Reported on 12/26/2022 11/22/21   [provider]  Continuous Blood Gluc Receiver (FREESTYLE LIBRE 14 DAY READER) DEVI 1 applicator by Does not apply route 4 (four) times daily. 11/24/17   [name], MD  Continuous Blood Gluc Sensor (FREESTYLE LIBRE 14 DAY SENSOR) MISC USE TO CHECK BLOOD SUGAR FOUR TIMES A DAY AS NEEDED 12/23/21   [name], MD  cyanocobalamin (,VITAMIN B-12,) 1000 MCG/ML injection INJECT 1 ML (1,000 MCG TOTAL) INTO THE MUSCLE ONCE A WEEK. FOR 4 WEEKS, THEN MONTHLY THEREAFTER Patient not taking: Reported on 12/26/2022 11/23/21   [name], MD  furosemide (LASIX) 20 MG tablet TAKE 1 TABLET (20 MG TOTAL)  BY MOUTH DAILY. AS NEEDED FOR FLUID RETENTION Patient not taking: Reported on 12/26/2022 02/01/21   [name], MD  Insulin Pen Needle (BD PEN NEEDLE NANO U/F) 32G X 4 MM MISC Please specify directions, refills and quantity 05/31/22   [name], MD  Melatonin 10 MG TABS Take 10 mg by mouth at bedtime as needed (sleep).  Patient not taking: Reported on 12/26/2022    [provider]  nystatin (MYCOSTATIN/NYSTOP) powder APPLY 1 APPLICATION TO AFFECTED AREA TWICE A DAY 12/23/20   Leone Haven, MD  SYRINGE-NEEDLE, DISP, 3 ML (BD INTEGRA SYRINGE) 25G X 1" 3 ML MISC FOR USE WITH B12 INJECTIONS WEEKLY/MONTHLY 08/01/18   [name], MD    Allergies as of 12/22/2022 - Review Complete 10/15/2022  Allergen Reaction Noted   Amoxicillin Hives, Rash, Itching, and Swelling 02/05/2012   Avocado Swelling 05/29/2015   Citrullus vulgaris Hives and Swelling 04/02/2012   Clarithromycin Hives and Rash 02/05/2012   Clarithromycin Anaphylaxis, Hives, and Rash 02/05/2012   Honey Anaphylaxis 04/02/2012   Other Hives and Swelling 05/29/2015   Sulfa antibiotics  Anaphylaxis 02/05/2012   Sulfa drugs cross reactors Anaphylaxis 02/05/2012   Watermelon concentrate Hives and Swelling 04/02/2012   Charentais melon (french melon)  04/02/2012   Insulin detemir Rash 37/34/2876   Silicone Itching and Rash 04/02/2012   Tape Itching and Rash 04/02/2012   Victoza [liraglutide] Nausea Only 06/11/2018    Family History  Problem Relation Age of Onset   Diabetes Mother    Hypertension Mother    Hypertension Father    Diabetes Father    Depression Other        strong hx of   Breast cancer Paternal Grandmother 60   Breast cancer Maternal Grandmother    Cancer Neg Hx     Social History   Socioeconomic History   Marital status: Married    Spouse name: Not on file   Number of children: Not on file   Years of education: Not on file   Highest education level: Not on file  Occupational History   Not on file  Tobacco Use   Smoking status: Former    Packs/day: 1.00    Years: 4.00    Total pack years: 4.00    Types: Cigarettes    Quit date: 02/05/1996    Years since quitting: 26.9   Smokeless tobacco: Never  Vaping Use   Vaping Use: Never used  Substance and Sexual Activity   Alcohol use: No   Drug use: No   Sexual activity: Yes    Birth control/protection: None  Other Topics Concern   Not on file  Social History Narrative   Not on file   Social Determinants of Health   Financial Resource Strain: Not on file  Food Insecurity: Not on file  Transportation Needs: Not on file  Physical Activity: Not on file  Stress: Not on file  Social Connections: Not on file  Intimate Partner Violence: Not on file    Review of Systems: See HPI, otherwise negative ROS  Physical Exam: BP 114/61   Pulse 74   Temp 97.6 F (36.4 C) (Temporal)   Ht '5\' 4"'$  (1.626 m)   Wt 101.6 kg   LMP 05/09/2016 (LMP Unknown) Comment: D/C  SpO2 97%   BMI 38.45 kg/m  General:   Alert, cooperative in NAD Head:  Normocephalic and atraumatic. Respiratory:  Normal work  of breathing. Cardiovascular:  RRR  Impression/Plan: Maria Bentley is  here for cataract surgery.  Risks, benefits, limitations, and alternatives regarding cataract surgery have been reviewed with the patient.  Questions have been answered.  All parties agreeable.   Benay Pillow, MD  01/15/2023, 9:25 AM

## 2023-01-15 NOTE — Transfer of Care (Signed)
Immediate Anesthesia Transfer of Care Note  Patient: Maria Bentley  Procedure(s) Performed: CATARACT EXTRACTION PHACO AND INTRAOCULAR LENS PLACEMENT ([facility]) LEFT DIABETIC 8.99 00:49.5 (Left: Eye)  Patient Location: PACU  Anesthesia Type:MAC  Level of Consciousness: awake, alert , and oriented  Airway & Oxygen Therapy: Patient Spontanous Breathing  Post-op Assessment: Report given to RN  Post vital signs: Reviewed and stable  Last Vitals:  Vitals Value Taken Time  BP 111/69 01/15/23 0955  Temp 36.2 C 01/15/23 0955  Pulse 76 01/15/23 0955  Resp 15 01/15/23 0955  SpO2 97 % 01/15/23 0955  Vitals shown include unvalidated device data.  Last Pain:  Vitals:   01/15/23 0845  TempSrc: Temporal  PainSc: 0-No pain         Complications: No notable events documented.

## 2023-01-15 NOTE — Op Note (Signed)
OPERATIVE NOTE  Mohini Heathcock [id number] 01/15/2023   PREOPERATIVE DIAGNOSIS:  Nuclear sclerotic cataract left eye.  H25.12   POSTOPERATIVE DIAGNOSIS:    Nuclear sclerotic cataract left eye.     PROCEDURE:  Phacoemusification with posterior chamber intraocular lens placement of the left eye   LENS:   Implant Name Type Inv. Item Serial No. Manufacturer Lot No. LRB No. Used Action  LENS IOL TECNIS EYHANCE 14.0 - [id number] Intraocular Lens LENS IOL TECNIS EYHANCE 14.0 [id number] [employer]  Left 1 Implanted      Procedure(s) with comments: CATARACT EXTRACTION PHACO AND INTRAOCULAR LENS PLACEMENT ([facility]) LEFT DIABETIC 8.99 00:49.5 (Left) - Diabetic  DIB00 +14.0   ULTRASOUND TIME: 0 minutes 49 seconds.  CDE 8.99   SURGEON:  Benay Pillow, MD, MPH   ANESTHESIA:  Topical with tetracaine drops augmented with 1% preservative-free intracameral lidocaine.  ESTIMATED BLOOD LOSS: <1 mL   COMPLICATIONS:  None.   DESCRIPTION OF PROCEDURE:  The patient was identified in the holding room and transported to the operating room and placed in the supine position under the operating microscope.  The left eye was identified as the operative eye and it was prepped and draped in the usual sterile ophthalmic fashion.   A 1.0 millimeter clear-corneal paracentesis was made at the 5:00 position. 0.5 ml of preservative-free 1% lidocaine with epinephrine was injected into the anterior chamber.  The anterior chamber was filled with viscoelastic.  A 2.4 millimeter keratome was used to make a near-clear corneal incision at the 2:00 position.  A curvilinear capsulorrhexis was made with a cystotome and capsulorrhexis forceps.  Balanced salt solution was used to hydrodissect and hydrodelineate the nucleus.   Phacoemulsification was then used in stop and chop fashion to remove the lens nucleus and epinucleus.  The remaining cortex was then removed using the irrigation and aspiration handpiece. Viscoelastic was  then placed into the capsular bag to distend it for lens placement.  A lens was then injected into the capsular bag.  The remaining viscoelastic was aspirated.   Wounds were hydrated with balanced salt solution.  The anterior chamber was inflated to a physiologic pressure with balanced salt solution.  Intracameral vigamox 0.1 mL undiltued was injected into the eye and a drop placed onto the ocular surface.  No wound leaks were noted.  The patient was taken to the recovery room in stable condition without complications of anesthesia or surgery  Benay Pillow 01/15/2023, 9:53 AM

## 2023-01-16 ENCOUNTER — Encounter: Payer: Self-pay | Admitting: Ophthalmology

## 2023-01-20 ENCOUNTER — Other Ambulatory Visit: Payer: Self-pay | Admitting: Internal Medicine

## 2023-03-03 ENCOUNTER — Other Ambulatory Visit: Payer: Self-pay | Admitting: Gastroenterology

## 2023-03-03 ENCOUNTER — Other Ambulatory Visit: Payer: Self-pay | Admitting: Internal Medicine

## 2023-03-21 ENCOUNTER — Other Ambulatory Visit: Payer: Self-pay

## 2023-03-21 ENCOUNTER — Emergency Department: Payer: BC Managed Care – PPO

## 2023-03-21 ENCOUNTER — Emergency Department
Admission: EM | Admit: 2023-03-21 | Discharge: 2023-03-21 | Disposition: A | Discharge status: Home / Self Care | Payer: BC Managed Care – PPO | Attending: Emergency Medicine | Admitting: Emergency Medicine

## 2023-03-21 DIAGNOSIS — S8002XA Contusion of left knee, initial encounter: Secondary | ICD-10-CM | POA: Diagnosis not present

## 2023-03-21 DIAGNOSIS — Y9389 Activity, other specified: Secondary | ICD-10-CM | POA: Diagnosis not present

## 2023-03-21 DIAGNOSIS — W1830XA Fall on same level, unspecified, initial encounter: Secondary | ICD-10-CM | POA: Insufficient documentation

## 2023-03-21 DIAGNOSIS — M7042 Prepatellar bursitis, left knee: Secondary | ICD-10-CM | POA: Diagnosis not present

## 2023-03-21 DIAGNOSIS — E1122 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 disease: Secondary | ICD-10-CM | POA: Insufficient documentation

## 2023-03-21 DIAGNOSIS — N189 Chronic kidney disease, unspecified: Secondary | ICD-10-CM | POA: Insufficient documentation

## 2023-03-21 DIAGNOSIS — S8992XA Unspecified injury of left lower leg, initial encounter: Secondary | ICD-10-CM | POA: Diagnosis present

## 2023-03-21 MED ORDER — CYCLOBENZAPRINE HCL 5 MG PO TABS: 5.0000 mg | ORAL_TABLET | Freq: Three times a day (TID) | ORAL | 0 refills | Status: DC | PRN

## 2023-03-21 NOTE — ED Provider Notes (Signed)
[facility] Emergency Department Provider Note     Event Date/Time   First MD Initiated Contact with Patient 03/21/23 1723     (approximate)   History   Fall   HPI  Maria Bentley is a 52 y.o. female with diabetes, HLD, anxiety, CKD, presents to the ED for evaluation of injury sustained following mechanical fall.  Patient presents from home after a fall noting that her backpack got caught on something, causing her to fall landing on her knees.  Physical Exam   Triage Vital Signs: ED Triage Vitals  Enc Vitals Group     BP 03/21/23 1719 (!) 155/83     Pulse Rate 03/21/23 1719 90     Resp 03/21/23 1719 18     Temp 03/21/23 1718 98.3 F (36.8 C)     Temp src --      SpO2 03/21/23 1719 99 %     Weight 03/21/23 1720 220 lb (99.8 kg)     Height 03/21/23 1720 5\' 4"  (1.626 m)     Head Circumference --      Peak Flow --      Pain Score 03/21/23 1720 7     Pain Loc --      Pain Edu? --      Excl. in GC? --     Most recent vital signs: Vitals:   03/21/23 1718 03/21/23 1719  BP:  (!) 155/83  Pulse:  90  Resp:  18  Temp: 98.3 F (36.8 C)   SpO2:  99%    General Awake, no distress. NAD CV:  Good peripheral perfusion.  RESP:  Normal effort.  ABD:  No distention.  MSK:  Right knee with abrasion of the patella with accompanying prepatellar bursitis and surrounding joint swelling. Superficial ecchymosis noted. Normal AROM to the knee. No valgus or varus joint stress noted.  No calf or Achilles tenderness.  Of anterior/posterior drawer sign.  Left knee with a superficial abrasion overlying the patella noted.   ED Results / Procedures / Treatments   Labs (all labs ordered are listed, but only abnormal results are displayed) Labs Reviewed - No data to display   EKG   RADIOLOGY  I personally viewed and evaluated these images as part of my medical decision making, as well as reviewing the written report by the radiologist.  ED  Provider Interpretation: no acute bony injury  DG Knee Complete 4 Views Left  Result Date: 03/21/2023 CLINICAL DATA:  Injury EXAM: LEFT KNEE - COMPLETE 4 VIEW COMPARISON:  None Available. FINDINGS: No evidence of fracture, dislocation, or joint effusion. Mild tricompartmental degenerative changes. Marked soft tissue edema of the anterior knee. Vascular calcifications. IMPRESSION: No acute displaced fracture or dislocation. Marked soft tissue edema of the anterior knee. Electronically Signed   By: Allegra Lai M.D.   On: 03/21/2023 18:18     PROCEDURES:  Critical Care performed: No  Procedures   MEDICATIONS ORDERED IN ED: Medications - No data to display   IMPRESSION / MDM / ASSESSMENT AND PLAN / ED COURSE  I reviewed the triage vital signs and the nursing notes.                              Differential diagnosis includes, but is not limited to, knee sprain, knee contusion, patellar fracture, tibial fracture, internal derangement  Patient's presentation is most consistent with acute complicated illness / injury  requiring diagnostic workup.  Patient's diagnosis is consistent with knee contusion/abrasion with prepatellar bursitis and soft tissue swelling.  No evidence of bony fracture based on interpretation of images.  No evidence of trauma to range based on my exam findings.  Patient will be discharged home with prescriptions for cyclobenzaprine.  Patient with the ace wrap. Patient is to follow up with Dr. Signa Kell as needed or otherwise directed. Patient is given ED precautions to return to the ED for any worsening or new symptoms.  FINAL CLINICAL IMPRESSION(S) / ED DIAGNOSES   Final diagnoses:  Contusion of left knee, initial encounter  Prepatellar bursitis of left knee     Rx / DC Orders   ED Discharge Orders          Ordered    cyclobenzaprine (FLEXERIL) 5 MG tablet  3 times daily PRN        03/21/23 1817             Note:  This document was prepared  using Dragon voice recognition software and may include unintentional dictation errors.    Lissa Hoard, PA-C 03/21/23 1925    Corena Herter, MD 03/21/23 2318

## 2023-03-21 NOTE — ED Triage Notes (Signed)
Pt to ED for fall today, states back pack got caught on something and hit left knee. C/o swelling and pain to left knee.  Denies any other injuries from fall

## 2023-03-21 NOTE — Discharge Instructions (Signed)
Your x-ray is negative for any acute fracture or dislocation.  No evidence of internal derangement on your exam.  You do have a significant amount of swelling and hematoma to the skin as well as evidence of a prepatellar bursitis.  Rest with the leg elevated and apply ice to reduce swelling.  Take OTC acetaminophen along with prescription muscle relaxants as discussed.  Follow-up with orthopedics for any ongoing concerns.

## 2023-03-22 ENCOUNTER — Telehealth: Payer: Self-pay

## 2023-03-22 NOTE — Transitions of Care (Post Inpatient/ED Visit) (Signed)
   03/22/2023  Name: Maria Bentley MRN: [id number] DOB: 1971-04-14  Today's TOC FU Call Status: Today's TOC FU Call Status:: Unsuccessul Call (1st Attempt) Unsuccessful Call (1st Attempt) Date: 03/22/23  Attempted to reach the patient regarding the most recent Inpatient/ED visit.  Follow Up Plan: Additional outreach attempts will be made to reach the patient to complete the Transitions of Care (Post Inpatient/ED visit) call.   Signature Valentino Nose, RN

## 2023-03-23 NOTE — Transitions of Care (Post Inpatient/ED Visit) (Signed)
   03/23/2023  Name: Maria Bentley MRN: [id number] DOB: [birth year]  Today's TOC FU Call Status: Today's TOC FU Call Status:: Unsuccessful Call (2nd Attempt) Unsuccessful Call (1st Attempt) Date: 03/22/23 Unsuccessful Call (2nd Attempt) Date: 03/23/23  Attempted to reach the patient regarding the most recent Inpatient/ED visit.  Follow Up Plan: Additional outreach attempts will be made to reach the patient to complete the Transitions of Care (Post Inpatient/ED visit) call.   Signature Valentino Nose, RN

## 2023-03-26 NOTE — Transitions of Care (Post Inpatient/ED Visit) (Signed)
   03/26/2023  Name: Maria Bentley MRN: [id number] DOB: November 28, 1971  Today's TOC FU Call Status: Today's TOC FU Call Status:: Successful TOC FU Call Competed Unsuccessful Call (1st Attempt) Date: 03/22/23 Unsuccessful Call (2nd Attempt) Date: 03/23/23 [facility] FU Call Complete Date: 03/26/23  Transition Care Management Follow-up Telephone Call Date of Discharge: 03/21/23 Discharge Facility[facility] Type of Discharge: Emergency Department Reason for ED Visit: Orthopedic Conditions Orthopedic/Injury Diagnosis:  (Contusion of left knee) How have you been since you were released from the hospital?: Better Any questions or concerns?: No  Items Reviewed: Did you receive and understand the discharge instructions provided?: Yes Medications obtained and verified?: Yes (Medications Reviewed) Any new allergies since your discharge?: No Dietary orders reviewed?: NA Do you have support at home?: Yes People in Home: spouse Name of Support/Comfort Primary Source: [facility] and Equipment/Supplies: Were Home Health Services Ordered?: No Any new equipment or medical supplies ordered?: No  Functional Questionnaire: Do you need assistance with bathing/showering or dressing?: No Do you need assistance with meal preparation?: No Do you need assistance with eating?: No Do you have difficulty maintaining continence: No Do you need assistance with getting out of bed/getting out of a chair/moving?: No Do you have difficulty managing or taking your medications?: No  Follow up appointments reviewed: PCP Follow-up appointment confirmed?: NA Specialist Hospital Follow-up appointment confirmed?: NA Do you need transportation to your follow-up appointment?: No Do you understand care options if your condition(s) worsen?: Yes-patient verbalized understanding  Pt declined needing any follow up.  Reports that she is doing better since being in ED.   Anise Salvo, RN

## 2023-03-27 ENCOUNTER — Other Ambulatory Visit: Payer: Self-pay | Admitting: Gastroenterology

## 2023-04-12 ENCOUNTER — Other Ambulatory Visit: Payer: Self-pay

## 2023-04-12 ENCOUNTER — Ambulatory Visit: Payer: BC Managed Care – PPO | Admitting: Gastroenterology

## 2023-04-17 ENCOUNTER — Other Ambulatory Visit: Payer: Self-pay | Admitting: Gastroenterology

## 2023-04-20 ENCOUNTER — Other Ambulatory Visit: Payer: Self-pay | Admitting: Internal Medicine

## 2023-04-23 MED ORDER — NOVOLOG FLEXPEN 100 UNIT/ML ~~LOC~~ SOPN: 20.0000 [IU] | PEN_INJECTOR | Freq: Three times a day (TID) | SUBCUTANEOUS | 0 refills | Status: DC

## 2023-04-24 ENCOUNTER — Ambulatory Visit: Payer: BC Managed Care – PPO | Admitting: Physician Assistant

## 2023-04-24 ENCOUNTER — Encounter: Payer: Self-pay | Admitting: Physician Assistant

## 2023-05-03 ENCOUNTER — Other Ambulatory Visit: Payer: Self-pay | Admitting: Internal Medicine

## 2023-05-03 DIAGNOSIS — Z1231 Encounter for screening mammogram for malignant neoplasm of breast: Secondary | ICD-10-CM

## 2023-05-12 ENCOUNTER — Other Ambulatory Visit: Payer: Self-pay | Admitting: Gastroenterology

## 2023-05-14 ENCOUNTER — Other Ambulatory Visit: Payer: Self-pay

## 2023-05-14 ENCOUNTER — Encounter: Payer: Self-pay | Admitting: Physician Assistant

## 2023-05-14 ENCOUNTER — Ambulatory Visit (INDEPENDENT_AMBULATORY_CARE_PROVIDER_SITE_OTHER): Payer: BC Managed Care – PPO | Admitting: Physician Assistant

## 2023-05-14 VITALS — BP 155/82 | HR 72 | Temp 97.8°F | Ht 64.0 in | Wt 230.4 lb

## 2023-05-14 DIAGNOSIS — Z1211 Encounter for screening for malignant neoplasm of colon: Secondary | ICD-10-CM | POA: Diagnosis not present

## 2023-05-14 DIAGNOSIS — K5904 Chronic idiopathic constipation: Secondary | ICD-10-CM

## 2023-05-14 DIAGNOSIS — R110 Nausea: Secondary | ICD-10-CM | POA: Diagnosis not present

## 2023-05-14 DIAGNOSIS — K8689 Other specified diseases of pancreas: Secondary | ICD-10-CM

## 2023-05-14 MED ORDER — POLYETHYLENE GLYCOL 3350 17 GM/SCOOP PO POWD: ORAL | 3 refills | Status: AC

## 2023-05-14 MED ORDER — ONDANSETRON HCL 4 MG PO TABS: 4.0000 mg | ORAL_TABLET | Freq: Three times a day (TID) | ORAL | 0 refills | Status: AC | PRN

## 2023-05-14 MED ORDER — SUTAB 1479-225-188 MG PO TABS: 12.0000 | ORAL_TABLET | Freq: Two times a day (BID) | ORAL | 0 refills | Status: DC

## 2023-05-14 MED ORDER — PANCRELIPASE (LIP-PROT-AMYL) 36000-114000 UNITS PO CPEP: ORAL_CAPSULE | ORAL | 12 refills | Status: DC

## 2023-05-14 NOTE — Patient Instructions (Addendum)
Constipation, Adult Constipation is when a person has fewer than three bowel movements in a week, has difficulty having a bowel movement, or has stools (feces) that are dry, hard, or larger than normal. Constipation may be caused by an underlying condition. It may become worse with age if a person takes certain medicines and does not take in enough fluids. Follow these instructions at home: Eating and drinking  Eat foods that have a lot of fiber, such as beans, whole grains, and fresh fruits and vegetables. Limit foods that are low in fiber and high in fat and processed sugars, such as fried or sweet foods. These include french fries, hamburgers, cookies, candies, and soda. Drink enough fluid to keep your urine pale yellow. General instructions Exercise regularly or as told by your health care provider. Try to do 150 minutes of moderate exercise each week. Use the bathroom when you have the urge to go. Do not hold it in. Take over-the-counter and prescription medicines only as told by your health care provider. This includes any fiber supplements. During bowel movements: Practice deep breathing while relaxing the lower abdomen. Practice pelvic floor relaxation. Watch your condition for any changes. Let your health care provider know about them. Keep all follow-up visits as told by your health care provider. This is important. Contact a health care provider if: You have pain that gets worse. You have a fever. You do not have a bowel movement after 4 days. You vomit. You are not hungry or you lose weight. You are bleeding from the opening between the buttocks (anus). You have thin, pencil-like stools. Get help right away if: You have a fever and your symptoms suddenly get worse. You leak stool or have blood in your stool. Your abdomen is bloated. You have severe pain in your abdomen. You feel dizzy or you faint. Summary Constipation is when a person has fewer than three bowel movements  in a week, has difficulty having a bowel movement, or has stools (feces) that are dry, hard, or larger than normal. Eat foods that have a lot of fiber, such as beans, whole grains, and fresh fruits and vegetables. Drink enough fluid to keep your urine pale yellow. Take over-the-counter and prescription medicines only as told by your health care provider. This includes any fiber supplements. This information is not intended to replace advice given to you by your health care provider. Make sure you discuss any questions you have with your health care provider. Document Revised: 10/11/2022 Document Reviewed: 10/11/2022 Elsevier Patient Education  2024 [employer] For Constipation:  Recommend High Fiber diet with fruits, vegetables, and whole grains. Drink 64 ounces of Fluids Daily. Start Miralax Mix 1 capful in a drink daily.

## 2023-05-14 NOTE — Progress Notes (Signed)
Celso Amy, PA-C [address]  [address]  [place], [place] [address]  Main: [phone]  Fax: [phone]   Gastroenterology Consultation  Referring Provider:     Sherlene Shams, MD Primary Care Physician:  Sherlene Shams, MD Primary Gastroenterologist:  Celso Amy, PA-C / Dr. Midge Minium  Reason for Consultation:     Pancreatic insufficiency, refill Creon, schedule colonoscopy        HPI:   Maria Bentley is a 52 y.o. y/o female referred for consultation & management  by Sherlene Shams, MD.    Previous patient Dr. Maximino Greenland.  Patient last saw Dr. 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  Has been on Creon 36,000 lipase units 2 capsules with each meal and 1 with each snack which has controlled her diarrhea.  History of Roux-en-Y gastric bypass in 2010.  Colonoscopy 10/2020 showed poor prep.  She could not drink TriLyte prep due to N/V.  Repeat colonoscopy 09/2021 showed poor prep.  There was a benign colon mucosa 6 mm polyp removed from transverse colon.  Biopsies were negative for microscopic colitis.  Repeat colonoscopy was recommended in 3 to 6 months with 2-day liquid prep, due to poor prep.  She has chronic constipation.  No current treatment.  Denies rectal bleeding.  Past Medical History:  Diagnosis Date   Abscess of right buttock 04/30/2019   Anemia    Anxiety    Arthritis    CKD (chronic kidney disease), stage III (HCC)    COVID-19 virus infection    Depression 2010   Diabetes mellitus    DKA, type 2 (HCC) 10/13/2019   Dysrhythmia    Endometriosis    Family history of adverse reaction to anesthesia    Mother - heart stopped during several procedures using propofol   Fibromyalgia    Headache, common migraine, intractable    History of kidney stones    History of ovarian cyst    left sided, caused by her endometriosis (possibly an endometrioma), in her 28s.   Hyperlipidemia     Hypertension    Hypothyroidism    Kidney stones    left   Morbid obesity (HCC)    s/p gastric bypass   Motion sickness    long car rides   Personal hx of gastric bypass 2010   Rosacea    Vertigo     Past Surgical History:  Procedure Laterality Date   CARPAL TUNNEL RELEASE Right 11/26/2020   Procedure: CARPAL TUNNEL RELEASE;  Surgeon: Kennedy Bucker, MD;  Location: [facility];  Service: Orthopedics;  Laterality: Right;   CATARACT EXTRACTION W/PHACO Right 01/01/2023   Procedure: CATARACT EXTRACTION PHACO AND INTRAOCULAR LENS PLACEMENT (IOC) RIGHT DIABETIC;  Surgeon: Nevada Crane, MD;  Location: [facility];  Service: Ophthalmology;  Laterality: Right;  10.12 053.5   CATARACT EXTRACTION W/PHACO Left 01/15/2023   Procedure: CATARACT EXTRACTION PHACO AND INTRAOCULAR LENS PLACEMENT (IOC) LEFT DIABETIC 8.99 00:49.5;  Surgeon: Nevada Crane, MD;  Location: [facility];  Service: Ophthalmology;  Laterality: Left;  Diabetic   COLONOSCOPY WITH PROPOFOL N/A 10/21/2020   Procedure: COLONOSCOPY WITH PROPOFOL;  Surgeon: Pasty Spillers, MD;  Location: [facility];  Service: Endoscopy;  Laterality: N/A;   COLONOSCOPY WITH PROPOFOL N/A 09/15/2021   Procedure: COLONOSCOPY WITH PROPOFOL;  Surgeon: Pasty Spillers, MD;  Location: [facility];  Service: Endoscopy;  Laterality: N/A;   DILATION AND CURETTAGE OF UTERUS  2011   gastric bypass     OPEN REDUCTION INTERNAL FIXATION (ORIF) DISTAL RADIAL FRACTURE Right 11/26/2020   Procedure: Open reduction internal fixation, right distal radius;  Surgeon: Kennedy Bucker, MD;  Location: [facility];  Service: Orthopedics;  Laterality: Right;   OVARIAN CYST REMOVAL     ROTATOR CUFF REPAIR     right   SHOULDER ARTHROSCOPY WITH SUBACROMIAL DECOMPRESSION AND OPEN ROTATOR C Right 07/19/2020   Procedure: Right shoulder arthroscopic vs mini-open rotator cuff repair vs Regeneten patch application, subacomial dcompression, and biceps  tenodesis;  Surgeon: Signa Kell, MD;  Location: [facility];  Service: Orthopedics;  Laterality: Right;    Prior to Admission medications   Medication Sig Start Date End Date Taking? Authorizing Provider  albuterol (VENTOLIN HFA) 108 (90 Base) MCG/ACT inhaler Inhale 2 puffs into the lungs every 6 (six) hours as needed for wheezing or shortness of breath. 10/02/19  Yes Remus Loffler, PA-C  amitriptyline (ELAVIL) 25 MG tablet TAKE 1 TABLET BY MOUTH EVERYDAY AT BEDTIME 11/06/22  Yes Sherlene Shams, MD  aspirin-acetaminophen-caffeine (EXCEDRIN MIGRAINE) [phone] MG tablet Take 2 tablets by mouth every 6 (six) hours as needed for headache.   Yes [provider]  atorvastatin (LIPITOR) 20 MG tablet TAKE 1 TABLET BY MOUTH EVERY DAY 01/09/23  Yes Sherlene Shams, MD  Azelastine HCl 137 MCG/SPRAY SOLN  11/22/21  Yes [provider]  buPROPion (WELLBUTRIN XL) 150 MG 24 hr tablet Take 450 mg by mouth daily.  05/12/18  Yes [provider]  CLINPRO 5000 1.1 % PSTE Place 1 application onto teeth at bedtime.  06/15/20  Yes [provider]  Continuous Blood Gluc Sensor (FREESTYLE LIBRE 14 DAY SENSOR) MISC USE TO CHECK BLOOD SUGAR FOUR TIMES A DAY AS NEEDED 12/23/21  Yes Sherlene Shams, MD  cyclobenzaprine (FLEXERIL) 10 MG tablet Take 0.5-1 tablets (5-10 mg total) by mouth 3 (three) times daily as needed for muscle spasms. 07/07/21  Yes Sherlene Shams, MD  cyclobenzaprine (FLEXERIL) 5 MG tablet Take 1 tablet (5 mg total) by mouth 3 (three) times daily as needed. 03/21/23  Yes Menshew, Charlesetta Ivory, PA-C  diphenhydrAMINE (BENADRYL) 25 MG tablet Take 25 mg by mouth every 6 (six) hours as needed for allergies.   Yes [provider]  EPINEPHrine 0.3 mg/0.3 mL IJ SOAJ injection Inject 0.3 mg into the muscle as needed for anaphylaxis.  06/25/10  Yes [provider]  escitalopram (LEXAPRO) 20 MG tablet Take 20 mg by mouth daily.  02/27/17  Yes [provider]   fluticasone (FLONASE) 50 MCG/ACT nasal spray SPRAY 2 SPRAYS INTO EACH NOSTRIL EVERY DAY 04/21/21  Yes Sherlene Shams, MD  furosemide (LASIX) 20 MG tablet TAKE 1 TABLET (20 MG TOTAL) BY MOUTH DAILY. AS NEEDED FOR FLUID RETENTION 02/01/21  Yes Sherlene Shams, MD  HORIZANT 600 MG TBCR TAKE 1 TABLET BY MOUTH TWICE A DAY 11/17/22  Yes Sherlene Shams, MD  insulin aspart (NOVOLOG FLEXPEN) 100 UNIT/ML FlexPen Inject 20 Units into the skin 3 (three) times daily with meals. 04/23/23  Yes Sherlene Shams, MD  insulin glargine (LANTUS SOLOSTAR) 100 UNIT/ML Solostar Pen Inject 45 Units into the skin daily. 12/19/22  Yes Sherlene Shams, MD  Insulin Pen Needle (BD PEN NEEDLE NANO U/F) 32G X 4 MM MISC Please specify directions, refills and quantity 05/31/22  Yes Sherlene Shams, MD  lamoTRIgine (LAMICTAL) 150 MG tablet Take 150 mg by mouth daily.  Yes [provider]  levothyroxine (SYNTHROID) 50 MCG tablet TAKE 1 TABLET BY MOUTH EVERY DAY 03/05/23  Yes Sherlene Shams, MD  lipase/protease/amylase (CREON) 36000 UNITS CPEP capsule TAKE 2 CAPSULES BY MOUTH 3 (THREE) TIMES DAILY BEFORE MEALS AND 1 CAPSULE WITH SNACKS. 04/17/23  Yes Vanga, Loel Dubonnet, MD  LORazepam (ATIVAN) 0.5 MG tablet Take 0.5-1 mg by mouth daily as needed for anxiety.    Yes [provider]  losartan (COZAAR) 25 MG tablet Take 1 tablet (25 mg total) by mouth daily. 06/05/22 06/05/23 Yes Sherlene Shams, MD  meclizine (ANTIVERT) 25 MG tablet Take 1 tablet (25 mg total) by mouth 3 (three) times daily as needed for dizziness. 06/16/15  Yes Sherlene Shams, MD  Melatonin 10 MG TABS Take 10 mg by mouth at bedtime as needed (sleep).   Yes [provider]  metoprolol tartrate (LOPRESSOR) 25 MG tablet TAKE 0.5 TABLETS BY MOUTH 2 TIMES DAILY. 08/20/22  Yes Webb, Padonda B, FNP  NURTEC 75 MG TBDP Take 1 tablet by mouth daily as needed. 11/25/21  Yes [provider]  nystatin (MYCOSTATIN/NYSTOP) powder APPLY 1 APPLICATION TO  AFFECTED AREA TWICE A DAY 12/23/20  Yes Glori Luis, MD  OnabotulinumtoxinA (BOTOX IJ) Inject as directed every 3 (three) months. For migraine   Yes [provider]  ondansetron (ZOFRAN ODT) 4 MG disintegrating tablet Take 1 tablet (4 mg total) by mouth every 8 (eight) hours as needed for nausea or vomiting. 10/03/21  Yes Chesley Noon, MD  SYRINGE-NEEDLE, DISP, 3 ML (BD INTEGRA SYRINGE) 25G X 1" 3 ML MISC FOR USE WITH B12 INJECTIONS WEEKLY/MONTHLY 08/01/18  Yes Sherlene Shams, MD  Continuous Blood Gluc Receiver (FREESTYLE LIBRE 14 DAY READER) DEVI 1 applicator by Does not apply route 4 (four) times daily. 11/24/17   Sherlene Shams, MD  cyanocobalamin (,VITAMIN B-12,) 1000 MCG/ML injection INJECT 1 ML (1,000 MCG TOTAL) INTO THE MUSCLE ONCE A WEEK. FOR 4 WEEKS, THEN MONTHLY THEREAFTER Patient not taking: Reported on 12/26/2022 11/23/21   Sherlene Shams, MD  glipiZIDE (GLUCOTROL) 5 MG tablet Take 5 mg by mouth daily before breakfast.    [provider]  hydrochlorothiazide (MICROZIDE) 12.5 MG capsule Take 12.5 mg by mouth daily.    [provider]  liraglutide (VICTOZA) 18 MG/3ML SOPN Inject 0.6 mg into the skin.    [provider]  metFORMIN (GLUMETZA) 1000 MG (MOD) 24 hr tablet Take 1,000 mg by mouth daily with breakfast.    [provider]  zonisamide (ZONEGRAN) 100 MG capsule 100 mg daily.    [provider]    Family History  Problem Relation Age of Onset   Diabetes Mother    Hypertension Mother    Hypertension Father    Diabetes Father    Depression Other        strong hx of   Breast cancer Paternal Grandmother 52   Breast cancer Maternal Grandmother    Cancer Neg Hx      Social History   Tobacco Use   Smoking status: Former    Packs/day: 1.00    Years: 4.00    Additional pack years: 0.00    Total pack years: 4.00    Types: Cigarettes    Quit date: 02/05/1996    Years since quitting: 27.2   Smokeless tobacco:  Never  Vaping Use   Vaping Use: Never used  Substance Use Topics   Alcohol use: No   Drug use:  No    Allergies as of 05/14/2023 - Review Complete 05/14/2023  Allergen Reaction Noted   Amoxicillin Hives, Rash, Itching, and Swelling 02/05/2012   Avocado Swelling 05/29/2015   Citrullus vulgaris Hives and Swelling 04/02/2012   Clarithromycin Hives and Rash 02/05/2012   Clarithromycin Anaphylaxis, Hives, and Rash 02/05/2012   Honey Anaphylaxis 04/02/2012   Other Hives and Swelling 05/29/2015   Sulfa antibiotics Anaphylaxis 02/05/2012   Sulfa drugs cross reactors Anaphylaxis 02/05/2012   Watermelon concentrate Hives and Swelling 04/02/2012   Charentais melon (french melon)  04/02/2012   Insulin detemir Rash 06/11/2018   Silicone Itching and Rash 04/02/2012   Tape Itching and Rash 04/02/2012   Victoza [liraglutide] Nausea Only 06/11/2018    Review of Systems:    All systems reviewed and negative except where noted in HPI.   Physical Exam:  BP (!) 155/82   Pulse 72   Temp 97.8 F (36.6 C)   Ht 5\' 4"  (1.626 m)   Wt 230 lb 6.4 oz (104.5 kg)   LMP 05/09/2016 (LMP Unknown) Comment: D/C  BMI 39.55 kg/m  Patient's last menstrual period was 05/09/2016 (lmp unknown). Psych:  Alert and cooperative. Normal mood and affect. General:   Alert,  Well-developed, well-nourished, pleasant and cooperative in NAD Head:  Normocephalic and atraumatic. Eyes:  Sclera clear, no icterus.   Conjunctiva pink. Neck:  Supple; no masses or thyromegaly. Lungs:  Respirations even and unlabored.  Clear throughout to auscultation.   No wheezes, crackles, or rhonchi. No acute distress. Heart:  Regular rate and rhythm; no murmurs, clicks, rubs, or gallops. Abdomen:  Normal bowel sounds.  No bruits.  Soft, and obese without masses, hepatosplenomegaly or hernias noted.  No Tenderness.  No guarding or rebound tenderness.    Neurologic:  Alert and oriented x3;  grossly normal neurologically. Psych:  Alert and  cooperative. Normal mood and affect.  Imaging Studies: No results found.  Assessment and Plan:   Maria Bentley is a 52 y.o. y/o female for annual follow-up of pancreatic insufficiency.  She is doing well on Creon and I am refilling this medication.  Last colonoscopy 09/2021 showed a poor prep.  I am scheduling a repeat colonoscopy with 2-day liquid and tube day prep.  Gave Zofran if needed for nausea.  History of gastric bypass.  She cannot tolerate TriLyte.  Also treating underlying constipation.  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  Chronic constipation  Discussed constipation treatment. Recommend High Fiber diet with fruits, vegetables, and whole grains. Drink 64 ounces of Fluids Daily. Start Miralax Mix 1 capful in a drink daily.  3.   Nausea with Colon prep.  Tx Zofran 4mg  Q 6 hr prn, #30, No refills.  4.   Colon Cancer Screening  Scheduling Colonoscopy I discussed risks of colonoscopy with patient to include risk of bleeding, colon perforation, and risk of sedation.   Patient expressed understanding and agrees to proceed with colonoscopy.  **2 Day Liquids; Day 1 SuTab Prep (Rx & Coupon); Day 2 Clenpiq Prep (Sample).   Follow up in 1 year; also follow-up based on test results and GI symptoms.  Celso Amy, PA-C

## 2023-05-15 ENCOUNTER — Encounter: Payer: Self-pay | Admitting: Oncology

## 2023-05-16 ENCOUNTER — Ambulatory Visit: Payer: BC Managed Care – PPO | Admitting: Internal Medicine

## 2023-05-16 VITALS — BP 118/70 | HR 79 | Temp 98.0°F | Resp 17 | Ht 64.0 in | Wt 232.2 lb

## 2023-05-16 DIAGNOSIS — Z794 Long term (current) use of insulin: Secondary | ICD-10-CM | POA: Diagnosis not present

## 2023-05-16 DIAGNOSIS — E538 Deficiency of other specified B group vitamins: Secondary | ICD-10-CM | POA: Diagnosis not present

## 2023-05-16 DIAGNOSIS — J0100 Acute maxillary sinusitis, unspecified: Secondary | ICD-10-CM

## 2023-05-16 DIAGNOSIS — E039 Hypothyroidism, unspecified: Secondary | ICD-10-CM | POA: Diagnosis not present

## 2023-05-16 DIAGNOSIS — I10 Essential (primary) hypertension: Secondary | ICD-10-CM

## 2023-05-16 DIAGNOSIS — E559 Vitamin D deficiency, unspecified: Secondary | ICD-10-CM | POA: Diagnosis not present

## 2023-05-16 DIAGNOSIS — E1122 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 disease: Secondary | ICD-10-CM

## 2023-05-16 DIAGNOSIS — Z91199 Patient's noncompliance with other medical treatment and regimen due to unspecified reason: Secondary | ICD-10-CM

## 2023-05-16 DIAGNOSIS — U099 Post covid-19 condition, unspecified: Secondary | ICD-10-CM

## 2023-05-16 DIAGNOSIS — N182 Chronic kidney disease, stage 2 (mild): Secondary | ICD-10-CM | POA: Diagnosis not present

## 2023-05-16 DIAGNOSIS — F32A Depression, unspecified: Secondary | ICD-10-CM

## 2023-05-16 DIAGNOSIS — R41840 Attention and concentration deficit: Secondary | ICD-10-CM

## 2023-05-16 LAB — VITAMIN D 25 HYDROXY (VIT D DEFICIENCY, FRACTURES): VITD: 38.77 ng/mL (ref 30.00–100.00)

## 2023-05-16 LAB — B12 AND FOLATE PANEL
Folate: 23 ng/mL (ref >5.9)
Vitamin B-12: 182 pg/mL — ABNORMAL LOW (ref 211–911)

## 2023-05-16 LAB — COMPREHENSIVE METABOLIC PANEL
ALT: 15 U/L (ref 0–35)
AST: 17 U/L (ref 0–37)
Albumin: 3.4 g/dL — ABNORMAL LOW (ref 3.5–5.2)
Alkaline Phosphatase: 99 U/L (ref 39–117)
BUN: 18 mg/dL (ref 6–23)
CO2: 21 mEq/L (ref 19–32)
Calcium: 8.6 mg/dL (ref 8.4–10.5)
Chloride: 103 mEq/L (ref 96–112)
Creatinine, Ser: 1.03 mg/dL (ref 0.40–1.20)
GFR: 62.37 mL/min (ref >60.00)
Glucose, Bld: 159 mg/dL — ABNORMAL HIGH (ref 70–99)
Potassium: 4.1 mEq/L (ref 3.5–5.1)
Sodium: 137 mEq/L (ref 135–145)
Total Bilirubin: 0.3 mg/dL (ref 0.2–1.2)
Total Protein: 6.8 g/dL (ref 6.0–8.3)

## 2023-05-16 LAB — POCT GLYCOSYLATED HEMOGLOBIN (HGB A1C): Hemoglobin A1C: 9.5 % — AB (ref 4.0–5.6)

## 2023-05-16 LAB — TSH: TSH: 1.46 u[IU]/mL (ref 0.35–5.50)

## 2023-05-16 MED ORDER — LORAZEPAM 0.5 MG PO TABS: 0.5000 mg | ORAL_TABLET | Freq: Every day | ORAL | 0 refills | Status: DC | PRN

## 2023-05-16 MED ORDER — METOPROLOL TARTRATE 25 MG PO TABS: ORAL_TABLET | ORAL | 3 refills | Status: DC

## 2023-05-16 MED ORDER — FREESTYLE LIBRE 3 SENSOR MISC: 5 refills | Status: DC

## 2023-05-16 MED ORDER — LANTUS SOLOSTAR 100 UNIT/ML ~~LOC~~ SOPN: 45.0000 [IU] | PEN_INJECTOR | Freq: Every day | SUBCUTANEOUS | 99 refills | Status: DC

## 2023-05-16 MED ORDER — AMITRIPTYLINE HCL 50 MG PO TABS: 50.0000 mg | ORAL_TABLET | Freq: Every day | ORAL | 3 refills | Status: DC

## 2023-05-16 MED ORDER — LEVOFLOXACIN 500 MG PO TABS: 500.0000 mg | ORAL_TABLET | Freq: Every day | ORAL | 0 refills | Status: AC

## 2023-05-16 MED ORDER — NOVOLOG FLEXPEN 100 UNIT/ML ~~LOC~~ SOPN: 20.0000 [IU] | PEN_INJECTOR | Freq: Three times a day (TID) | SUBCUTANEOUS | 0 refills | Status: DC

## 2023-05-16 NOTE — Patient Instructions (Addendum)
I will refill thyroid , ATORVASTATIN AND LOSARTAN ONCE I REVIEW TODAY'S LABS   YOU HAVE TO SEE ME AT LEAST TWICE A YEAR FOR YOUR DIABETES . This is a MUST!!!!!  PLEASE SCHEDULE A 1 MONTH FOLLOW UP (VIRTUAL OK) TO REVIEW YOUR BLOOD SUGARS

## 2023-05-16 NOTE — Progress Notes (Signed)
Subjective:  Patient ID: Maria Bentley, female    DOB: 05-28-1971  Age: 52 y.o. MRN: [id number]  CC: The primary encounter diagnosis was Vitamin D deficiency. Diagnoses of B12 deficiency, Acquired hypothyroidism, Type 2 diabetes mellitus with stage 2 chronic kidney disease, without long-term current use of insulin (HCC), Encounter for long-term (current) use of insulin (HCC), COVID-19 long hauler manifesting chronic concentration deficit, Acute non-recurrent maxillary sinusitis, Depressive disorder, Primary hypertension, and Nonadherence to medical treatment were also pertinent to this visit.   HPI Maria Bentley presents for  Chief Complaint  Patient presents with   Medical Management of Chronic Issues    Diabetic f/u   Depression    PHQ9:10/GAD7-:9   Sinusitis    Tried OTC cold med with decongestant & Tylenol last week. Still c/o slight cough(getting better) PND, HA, blowing thick yellow mucous. No sore throat.   1) uncontrolled type 2 DM  last seen ONE YEAR AGO. Has been medically  noncompliant   Does not take her insulin regularly.  Prescribed 35 to 40 units of basaglar,  and 20 units of novolog qac with titration as needed.  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 However,  having moderate  pain in the frontal and maxillary sinuses on the right   3) DEPRESSION/anxeity : chronic,  aggravated by history of LONG COVID.   Worse since losing her beloved dachsund suddenly in January LAST PSYCH VISIT WAS ONE YEAR AGO.  OUT OF LORAZEPAM.  Seeing Jonna Clark  at [facility]. In July (former psychiatrist retired).  Taking lamictal ,  wellbutrin and lexapro  out of lexapro .  Has run out of lorazepam for anxiety .  Is also set up to see a therapist here in [place]   4) sciatica:  pain alternates  legs  plans to return to  [place]   5) traumatic effusion of left knee medial side following a fall onto  a carboard box  knee 6 weeks ago.    Effusion increased  to the size of an orange  ER evaluation included  x rays :  no fractures , DID NOT DRAIN IT   SWELLING  WAS MANAGED WITH ICE compression wrap   Outpatient Medications Prior to Visit  Medication Sig Dispense Refill   albuterol (VENTOLIN HFA) 108 (90 Base) MCG/ACT inhaler Inhale 2 puffs into the lungs every 6 (six) hours as needed for wheezing or shortness of breath. 18 g 0   aspirin-acetaminophen-caffeine (EXCEDRIN MIGRAINE) 250-250-65 MG tablet Take 2 tablets by mouth every 6 (six) hours as needed for headache.     Azelastine HCl 137 MCG/SPRAY SOLN      buPROPion (WELLBUTRIN XL) 150 MG 24 hr tablet Take 450 mg by mouth daily.   3   CLINPRO 5000 1.1 % PSTE Place 1 application onto teeth at bedtime.      cyclobenzaprine (FLEXERIL) 10 MG tablet Take 0.5-1 tablets (5-10 mg total) by mouth 3 (three) times daily as needed for muscle spasms. 30 tablet 12   cyclobenzaprine (FLEXERIL) 5 MG tablet Take 1 tablet (5 mg total) by mouth 3 (three) times daily as needed. 15 tablet 0   diphenhydrAMINE (BENADRYL) 25 MG tablet Take 25 mg by mouth every 6 (six) hours as needed for allergies.     EPINEPHrine 0.3  mg/0.3 mL IJ SOAJ injection Inject 0.3 mg into the muscle as needed for anaphylaxis.      escitalopram (LEXAPRO) 20 MG tablet Take 20 mg by mouth daily.      fluticasone (FLONASE) 50 MCG/ACT nasal spray SPRAY 2 SPRAYS INTO EACH NOSTRIL EVERY DAY 48 mL 1   furosemide (LASIX) 20 MG tablet TAKE 1 TABLET (20 MG TOTAL) BY MOUTH DAILY. AS NEEDED FOR FLUID RETENTION 90 tablet 1   HORIZANT 600 MG TBCR TAKE 1 TABLET BY MOUTH TWICE A DAY 60 tablet 5   Insulin Pen Needle (BD PEN NEEDLE NANO U/F) 32G X 4 MM MISC Please specify directions, refills and quantity 100 each 11   lamoTRIgine (LAMICTAL) 150 MG tablet Take 150 mg by mouth daily.     levothyroxine (SYNTHROID) 50 MCG tablet TAKE  1 TABLET BY MOUTH EVERY DAY 90 tablet 0   lipase/protease/amylase (CREON) 36000 UNITS CPEP capsule TAKE 2 CAPSULES BY MOUTH 3 (THREE) TIMES DAILY BEFORE MEALS AND 1 CAPSULE WITH SNACKS. 300 capsule 12   meclizine (ANTIVERT) 25 MG tablet Take 1 tablet (25 mg total) by mouth 3 (three) times daily as needed for dizziness. 90 tablet 6   Melatonin 10 MG TABS Take 10 mg by mouth at bedtime as needed (sleep).     NURTEC 75 MG TBDP Take 1 tablet by mouth daily as needed.     nystatin (MYCOSTATIN/NYSTOP) powder APPLY 1 APPLICATION TO AFFECTED AREA TWICE A DAY 60 g 0   OnabotulinumtoxinA (BOTOX IJ) Inject as directed every 3 (three) months. For migraine     ondansetron (ZOFRAN ODT) 4 MG disintegrating tablet Take 1 tablet (4 mg total) by mouth every 8 (eight) hours as needed for nausea or vomiting. 12 tablet 0   ondansetron (ZOFRAN) 4 MG tablet Take 1 tablet (4 mg total) by mouth every 8 (eight) hours as needed for nausea or vomiting. 30 tablet 0   polyethylene glycol powder (GLYCOLAX/MIRALAX) 17 GM/SCOOP powder Mix 1 capful in a drink once daily for Constipation. 255 g 3   Sodium Sulfate-Mag Sulfate-KCl (SUTAB) [phone] MG TABS Take 12 tablets by mouth in the morning and at bedtime. 24 tablet 0   SYRINGE-NEEDLE, DISP, 3 ML (BD INTEGRA SYRINGE) 25G X 1" 3 ML MISC FOR USE WITH B12 INJECTIONS WEEKLY/MONTHLY 25 each 0   amitriptyline (ELAVIL) 25 MG tablet TAKE 1 TABLET BY MOUTH EVERYDAY AT BEDTIME 90 tablet 1   atorvastatin (LIPITOR) 20 MG tablet TAKE 1 TABLET BY MOUTH EVERY DAY 90 tablet 1   Continuous Blood Gluc Sensor (FREESTYLE LIBRE 14 DAY SENSOR) MISC USE TO CHECK BLOOD SUGAR FOUR TIMES A DAY AS NEEDED 2 each 11   LORazepam (ATIVAN) 0.5 MG tablet Take 0.5 mg by mouth daily as needed for anxiety.     losartan (COZAAR) 25 MG tablet Take 1 tablet (25 mg total) by mouth daily. 90 tablet 1   Continuous Blood Gluc Receiver (FREESTYLE LIBRE 14 DAY READER) DEVI 1 applicator by Does not apply route 4 (four)  times daily. 1 Device 1   cyanocobalamin (,VITAMIN B-12,) 1000 MCG/ML injection INJECT 1 ML (1,000 MCG TOTAL) INTO THE MUSCLE ONCE A WEEK. FOR 4 WEEKS, THEN MONTHLY THEREAFTER (Patient not taking: Reported on 12/26/2022) 3 mL 0   glipiZIDE (GLUCOTROL) 5 MG tablet Take 5 mg by mouth daily before breakfast.     hydrochlorothiazide (MICROZIDE) 12.5 MG capsule Take 12.5 mg by mouth daily.     insulin aspart (NOVOLOG FLEXPEN) 100 UNIT/ML FlexPen  Inject 20 Units into the skin 3 (three) times daily with meals. 15 mL 0   insulin glargine (LANTUS SOLOSTAR) 100 UNIT/ML Solostar Pen Inject 45 Units into the skin daily. 15 mL PRN   liraglutide (VICTOZA) 18 MG/3ML SOPN Inject 0.6 mg into the skin.     metFORMIN (GLUMETZA) 1000 MG (MOD) 24 hr tablet Take 1,000 mg by mouth daily with breakfast.     metoprolol tartrate (LOPRESSOR) 25 MG tablet TAKE 0.5 TABLETS BY MOUTH 2 TIMES DAILY. 90 tablet 3   zonisamide (ZONEGRAN) 100 MG capsule 100 mg daily.     No facility-administered medications prior to visit.    Review of Systems;  Patient denies headache, fevers, malaise, unintentional weight loss, skin rash, eye pain, sinus congestion and sinus pain, sore throat, dysphagia,  hemoptysis , cough, dyspnea, wheezing, chest pain, palpitations, orthopnea, edema, abdominal pain, nausea, melena, diarrhea, constipation, flank pain, dysuria, hematuria, urinary  Frequency, nocturia, numbness, tingling, seizures,  Focal weakness, Loss of consciousness,  Tremor, insomnia, depression, anxiety, and suicidal ideation.      Objective:  BP 118/70   Pulse 79   Temp 98 F (36.7 C) (Oral)   Resp 17   Ht 5\' 4"  (1.626 m)   Wt 232 lb 4 oz (105.3 kg)   LMP 05/09/2016 (LMP Unknown) Comment: D/C  SpO2 98%   BMI 39.87 kg/m   BP Readings from Last 3 Encounters:  05/16/23 118/70  05/14/23 (!) 155/82  03/21/23 (!) 155/83    Wt Readings from Last 3 Encounters:  05/16/23 232 lb 4 oz (105.3 kg)  05/14/23 230 lb 6.4 oz (104.5 kg)   03/21/23 220 lb (99.8 kg)    Physical Exam  Lab Results  Component Value Date   HGBA1C 9.5 (A) 05/16/2023   HGBA1C 10.5 (H) 06/02/2022   HGBA1C 12.1 (A) 11/28/2021    Lab Results  Component Value Date   CREATININE 1.03 05/16/2023   CREATININE 1.14 06/02/2022   CREATININE 1.11 11/28/2021    Lab Results  Component Value Date   WBC 10.4 11/14/2021   HGB 10.8 (L) 11/14/2021   HCT 38.2 11/14/2021   PLT 454 (H) 11/14/2021   GLUCOSE 159 (H) 05/16/2023   CHOL 128 05/16/2023   TRIG 111 05/16/2023   HDL 45 (L) 05/16/2023   LDLDIRECT 92.0 02/21/2017   LDLCALC 64 05/16/2023   ALT 15 05/16/2023   AST 17 05/16/2023   NA 137 05/16/2023   K 4.1 05/16/2023   CL 103 05/16/2023   CREATININE 1.03 05/16/2023   BUN 18 05/16/2023   CO2 21 05/16/2023   TSH 1.46 05/16/2023   INR 0.9 10/25/2019   HGBA1C 9.5 (A) 05/16/2023   MICROALBUR 2.3 (H) 05/16/2023    DG Knee Complete 4 Views Left  Result Date: 03/21/2023 CLINICAL DATA:  Injury EXAM: LEFT KNEE - COMPLETE 4 VIEW COMPARISON:  None Available. FINDINGS: No evidence of fracture, dislocation, or joint effusion. Mild tricompartmental degenerative changes. Marked soft tissue edema of the anterior knee. Vascular calcifications. IMPRESSION: No acute displaced fracture or dislocation. Marked soft tissue edema of the anterior knee. Electronically Signed   By: Allegra Lai M.D.   On: 03/21/2023 18:18    Assessment & Plan:  .Vitamin D deficiency -     VITAMIN D 25 Hydroxy (Vit-D Deficiency, Fractures)  B12 deficiency -     B12 and Folate Panel -     Intrinsic Factor Antibodies  Acquired hypothyroidism Assessment & Plan: Thyroid function is WNL on 50 mcg  daily levothyroxine.  No current changes needed.    Lab Results  Component Value Date   TSH 1.46 05/16/2023     Orders: -     TSH  Type 2 diabetes mellitus with stage 2 chronic kidney disease, without long-term current use of insulin (HCC) Assessment & Plan: She remains  uncontrolled despite use of mealtime insulin,  basal insulin and intermittent use of a CBG .  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  She has lost her CBG reader.  We are upgrading her to the freestyle 3 and will have her follow up in one month to review data.   Orders: -     POCT glycosylated hemoglobin (Hb A1C)  Encounter for long-term (current) use of insulin (HCC) -     Comprehensive metabolic panel -     Lipid Panel w/reflex Direct LDL -     Microalbumin / creatinine urine ratio  COVID-19 long hauler manifesting chronic concentration deficit Assessment & Plan: She remains disabled due to  concentration deficits and chronic fatigue .    Acute non-recurrent maxillary sinusitis Assessment & Plan: Given chronicity of symptoms, development of facial pain and exam consistent with bacterial URI,  Will treat with empiric antibiotics, decongestants, and saline lavage.  Adding steroid nasal spray if not already taking.     Depressive disorder Assessment & Plan: Managed by psychiatry .  Aggravted by the death of her beloved dog in 2023-01-25 . Lorazepam refilled until she can meet with her newly assigned psychiatrist    Primary hypertension Assessment & Plan: Continue losartan 25 mg daily and metoprolol 25 mg bid    Nonadherence to medical treatment Assessment & Plan: Chronic, , has been occurring for years.  She is aware of the consequences but has been plagued with depression for years.     Other orders -     NovoLOG FlexPen; Inject 20 Units into the skin 3 (three) times daily with meals.  Dispense: 15 mL; Refill: 0 -     Lantus SoloStar; Inject 45 Units into the skin daily.  Dispense: 15 mL; Refill: PRN -     Metoprolol Tartrate; TAKE 0.5 TABLETS BY MOUTH 2 TIMES DAILY.  Dispense: 90 tablet; Refill: 3 -     LORazepam; Take 1 tablet (0.5 mg total) by  mouth daily as needed for anxiety.  Dispense: 30 tablet; Refill: 0 -     FreeStyle Libre 3 Sensor; Place 1 sensor on the skin every 14 days. Use to check glucose continuously  Dispense: 2 each; Refill: 5 -     levoFLOXacin; Take 1 tablet (500 mg total) by mouth daily for 7 days.  Dispense: 7 tablet; Refill: 0 -     Amitriptyline HCl; Take 1 tablet (50 mg total) by mouth at bedtime.  Dispense: 90 tablet; Refill: 3 -     Atorvastatin Calcium; Take 1 tablet (20 mg total) by mouth daily.  Dispense: 90 tablet; Refill: 1 -     Losartan Potassium; Take 1 tablet (25 mg total) by mouth daily.  Dispense: 90 tablet; Refill: 1     I provided 31 minutes of face-to-face time during this encounter reviewing patient's last visit with me, previous  labs and imaging studies, counseling on currently non adherence ,  and post visit ordering to diagnostics and therapeutics .   Follow-up: Return in about 4 weeks (around 06/13/2023) for  follow up diabetes.   Sherlene Shams, MD

## 2023-05-17 LAB — LIPID PANEL W/REFLEX DIRECT LDL: HDL: 45 mg/dL — ABNORMAL LOW (ref >=50)

## 2023-05-17 LAB — MICROALBUMIN / CREATININE URINE RATIO
Creatinine,U: 99.5 mg/dL
Microalb Creat Ratio: 2.3 mg/g (ref 0.0–30.0)
Microalb, Ur: 2.3 mg/dL — ABNORMAL HIGH (ref 0.0–1.9)

## 2023-05-18 ENCOUNTER — Ambulatory Visit: Payer: BC Managed Care – PPO | Admitting: Podiatry

## 2023-05-18 DIAGNOSIS — E538 Deficiency of other specified B group vitamins: Secondary | ICD-10-CM | POA: Insufficient documentation

## 2023-05-18 LAB — LIPID PANEL W/REFLEX DIRECT LDL
Cholesterol: 128 mg/dL (ref <200)
LDL Cholesterol (Calc): 64 mg/dL (calc)
Non-HDL Cholesterol (Calc): 83 mg/dL (calc) (ref <130)
Total CHOL/HDL Ratio: 2.8 (calc) (ref <5.0)
Triglycerides: 111 mg/dL (ref <150)

## 2023-05-18 LAB — INTRINSIC FACTOR ANTIBODIES: Intrinsic Factor: NEGATIVE

## 2023-05-18 MED ORDER — LOSARTAN POTASSIUM 25 MG PO TABS: 25.0000 mg | ORAL_TABLET | Freq: Every day | ORAL | 1 refills | Status: DC

## 2023-05-18 MED ORDER — ATORVASTATIN CALCIUM 20 MG PO TABS: 20.0000 mg | ORAL_TABLET | Freq: Every day | ORAL | 1 refills | Status: DC

## 2023-05-18 NOTE — Assessment & Plan Note (Signed)
Given chronicity of symptoms, development of facial pain and exam consistent with bacterial URI,  Will treat with empiric antibiotics, decongestants, and saline lavage.  Adding steroid nasal spray if not already taking.  °

## 2023-05-18 NOTE — Assessment & Plan Note (Signed)
Continue losartan 25 mg daily and metoprolol 25 mg bid  °

## 2023-05-18 NOTE — Assessment & Plan Note (Signed)
She remains disabled due to  concentration deficits and chronic fatigue .

## 2023-05-18 NOTE — Assessment & Plan Note (Signed)
Thyroid function is WNL on 50 mcg daily levothyroxine.  No current changes needed.    Lab Results  Component Value Date   TSH 1.46 05/16/2023

## 2023-05-18 NOTE — Assessment & Plan Note (Signed)
Managed by psychiatry .  Aggravted by the death of her beloved dog in January 14, 2023 . Lorazepam refilled until she can meet with her newly assigned psychiatrist

## 2023-05-18 NOTE — Assessment & Plan Note (Signed)
Recurrent  

## 2023-05-18 NOTE — Assessment & Plan Note (Signed)
Chronic, , has been occurring for years.  She is aware of the consequences but has been plagued with depression for years.

## 2023-05-18 NOTE — Assessment & Plan Note (Signed)
She remains uncontrolled despite use of mealtime insulin,  basal insulin and intermittent use of a CBG .  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  She has lost her CBG reader.  We are upgrading her to the freestyle 3 and will have her follow up in one month to review data.

## 2023-05-21 ENCOUNTER — Other Ambulatory Visit: Payer: Self-pay | Admitting: Internal Medicine

## 2023-05-21 ENCOUNTER — Encounter: Payer: Self-pay | Admitting: Internal Medicine

## 2023-05-22 NOTE — Telephone Encounter (Signed)
Is it okay to send in the rx for b12 and syringes?

## 2023-05-25 ENCOUNTER — Ambulatory Visit
Admission: RE | Admit: 2023-05-25 | Discharge: 2023-05-25 | Disposition: A | Discharge status: Home / Self Care | Payer: BC Managed Care – PPO | Source: Ambulatory Visit | Attending: Internal Medicine | Admitting: Internal Medicine

## 2023-05-25 DIAGNOSIS — Z1231 Encounter for screening mammogram for malignant neoplasm of breast: Secondary | ICD-10-CM

## 2023-05-25 MED ORDER — "BD INTEGRA SYRINGE 25G X 1"" 3 ML MISC": 0 refills | Status: DC

## 2023-05-25 MED ORDER — CYANOCOBALAMIN 1000 MCG/ML IJ SOLN: INTRAMUSCULAR | 1 refills | Status: DC

## 2023-06-05 ENCOUNTER — Encounter: Payer: Self-pay | Admitting: Gastroenterology

## 2023-06-05 MED ORDER — CYCLOBENZAPRINE HCL 10 MG PO TABS: 5.0000 mg | ORAL_TABLET | Freq: Three times a day (TID) | ORAL | 12 refills | Status: DC | PRN

## 2023-06-05 NOTE — Telephone Encounter (Signed)
Refilled: 07/07/2021 Last OV: 05/16/2023 Next OV: not scheduled

## 2023-06-05 NOTE — Addendum Note (Signed)
Addended by: Sandy Salaam on: 06/05/2023 08:58 AM   Modules accepted: Orders

## 2023-06-06 ENCOUNTER — Ambulatory Visit: Payer: BC Managed Care – PPO | Admitting: Certified Registered"

## 2023-06-06 ENCOUNTER — Encounter
Admission: RE | Disposition: A | Discharge status: Home / Self Care | Payer: Self-pay | Source: Home / Self Care | Attending: Gastroenterology

## 2023-06-06 ENCOUNTER — Encounter: Payer: Self-pay | Admitting: Gastroenterology

## 2023-06-06 ENCOUNTER — Ambulatory Visit
Admission: RE | Admit: 2023-06-06 | Discharge: 2023-06-06 | Disposition: A | Discharge status: Home / Self Care | Payer: BC Managed Care – PPO | Attending: Gastroenterology | Admitting: Gastroenterology

## 2023-06-06 DIAGNOSIS — I129 Hypertensive chronic kidney disease with stage 1 through stage 4 chronic kidney disease, or unspecified chronic kidney disease: Secondary | ICD-10-CM | POA: Diagnosis not present

## 2023-06-06 DIAGNOSIS — N183 Chronic kidney disease, stage 3 unspecified: Secondary | ICD-10-CM | POA: Diagnosis not present

## 2023-06-06 DIAGNOSIS — Z1211 Encounter for screening for malignant neoplasm of colon: Secondary | ICD-10-CM | POA: Insufficient documentation

## 2023-06-06 DIAGNOSIS — Z5982 Transportation insecurity: Secondary | ICD-10-CM | POA: Diagnosis not present

## 2023-06-06 DIAGNOSIS — Z9884 Bariatric surgery status: Secondary | ICD-10-CM | POA: Diagnosis not present

## 2023-06-06 DIAGNOSIS — E1122 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 disease: Secondary | ICD-10-CM | POA: Insufficient documentation

## 2023-06-06 DIAGNOSIS — K5140 Inflammatory polyps of colon without complications: Secondary | ICD-10-CM | POA: Insufficient documentation

## 2023-06-06 DIAGNOSIS — K635 Polyp of colon: Secondary | ICD-10-CM

## 2023-06-06 DIAGNOSIS — Z87891 Personal history of nicotine dependence: Secondary | ICD-10-CM | POA: Insufficient documentation

## 2023-06-06 HISTORY — PX: COLONOSCOPY WITH PROPOFOL: SHX5780

## 2023-06-06 HISTORY — PX: POLYPECTOMY: SHX5525

## 2023-06-06 LAB — GLUCOSE, CAPILLARY
Glucose-Capillary: 77 mg/dL (ref 70–99)
Glucose-Capillary: 80 mg/dL (ref 70–99)

## 2023-06-06 SURGERY — COLONOSCOPY WITH PROPOFOL
Anesthesia: General

## 2023-06-06 MED ORDER — PROPOFOL 1000 MG/100ML IV EMUL
INTRAVENOUS | Status: AC
  Filled 2023-06-06: qty 100

## 2023-06-06 MED ORDER — DEXTROSE 5 % IV SOLN: INTRAVENOUS | Status: DC

## 2023-06-06 MED ORDER — LIDOCAINE HCL (PF) 2 % IJ SOLN
INTRAMUSCULAR | Status: AC
  Filled 2023-06-06: qty 5

## 2023-06-06 MED ORDER — LIDOCAINE HCL (PF) 2 % IJ SOLN
INTRAMUSCULAR | Status: DC | PRN
  Administered 2023-06-06: 40 mg via INTRADERMAL

## 2023-06-06 MED ORDER — PROPOFOL 10 MG/ML IV BOLUS
INTRAVENOUS | Status: DC | PRN
  Administered 2023-06-06: 20 mg via INTRAVENOUS
  Administered 2023-06-06: 50 mg via INTRAVENOUS
  Administered 2023-06-06 (×6): 20 mg via INTRAVENOUS

## 2023-06-06 MED ORDER — DEXTROSE 5 % IV SOLN: INTRAVENOUS | Status: DC | PRN

## 2023-06-06 MED ORDER — SODIUM CHLORIDE 0.9 % IV SOLN
INTRAVENOUS | Status: DC
  Administered 2023-06-06: 1000 mL via INTRAVENOUS

## 2023-06-06 NOTE — Op Note (Signed)
[facility] Patient Name: Maria Bentley Procedure Date: 06/06/2023 9:14 AM MRN: [id number] Account #: [id number] Date of Birth: 1971-09-13 Admit Type: Outpatient Age: 52 Room: [facility] ENDO ROOM 3 Gender: Female Note Status: Finalized Instrument Name: Prentice Docker [id number] Procedure:             Colonoscopy Indications:           Screening for colorectal malignant neoplasm Providers:             Midge Minium MD, MD Referring MD:          Duncan Dull, MD (Referring MD) Medicines:             Propofol per Anesthesia Complications:         No immediate complications. Procedure:             Pre-Anesthesia Assessment:                        - Prior to the procedure, a History and Physical was                         performed, and patient medications and allergies were                         reviewed. The patient's tolerance of previous                         anesthesia was also reviewed. The risks and benefits                         of the procedure and the sedation options and risks                         were discussed with the patient. All questions were                         answered, and informed consent was obtained. Prior                         Anticoagulants: The patient has taken no anticoagulant                         or antiplatelet agents. ASA Grade Assessment: II - A                         patient with mild systemic disease. After reviewing                         the risks and benefits, the patient was deemed in                         satisfactory condition to undergo the procedure.                        After obtaining informed consent, the colonoscope was                         passed under direct vision. Throughout the procedure,  the patient's blood pressure, pulse, and oxygen                         saturations were monitored continuously. The                         Colonoscope was introduced through  the anus and                         advanced to the the cecum, identified by appendiceal                         orifice and ileocecal valve. The colonoscopy was                         performed without difficulty. The patient tolerated                         the procedure well. The quality of the bowel                         preparation was good. Findings:      The perianal and digital rectal examinations were normal.      A 4 mm polyp was found in the sigmoid colon. The polyp was sessile. The       polyp was removed with a cold snare. Resection and retrieval were       complete. Impression:            - One 4 mm polyp in the sigmoid colon, removed with a                         cold snare. Resected and retrieved. Recommendation:        - Discharge patient to home.                        - Resume previous diet.                        - Continue present medications.                        - Await pathology results.                        - If the pathology report reveals adenomatous tissue,                         then repeat the colonoscopy for surveillance in 7                         years. Procedure Code(s):     --- Professional ---                        [phone], Colonoscopy, flexible; with removal of                         tumor(s), polyp(s), or other lesion(s) by snare  technique Diagnosis Code(s):     --- Professional ---                        Z12.11, Encounter for screening for malignant neoplasm                         of colon                        D12.5, Benign neoplasm of sigmoid colon CPT copyright 2022 American Medical Association. All rights reserved. The codes documented in this report are preliminary and upon coder review may  be revised to meet current compliance requirements. Midge Minium MD, MD 06/06/2023 9:48:22 AM This report has been signed electronically. Number of Addenda: 0 Note Initiated On: 06/06/2023 9:14 AM Scope Withdrawal  Time: 0 hours 8 minutes 34 seconds  Total Procedure Duration: 0 hours 15 minutes 18 seconds  Estimated Blood Loss:  Estimated blood loss: none.      [facility]

## 2023-06-06 NOTE — Anesthesia Preprocedure Evaluation (Signed)
Anesthesia Evaluation  Patient identified by MRN, date of birth, ID band Patient awake    Reviewed: Allergy & Precautions, NPO status , Patient's Chart, lab work & pertinent test results, reviewed documented beta blocker date and time   History of Anesthesia Complications Negative for: history of anesthetic complications  Airway Mallampati: III  TM Distance: >3 FB Neck ROM: Full    Dental  (+) Missing, Teeth Intact,    Pulmonary shortness of breath and with exertion, neg COPD, neg recent URI, former smoker   Pulmonary exam normal breath sounds clear to auscultation       Cardiovascular Exercise Tolerance: Poor hypertension, Pt. on medications and Pt. on home beta blockers (-) angina + CAD  (-) Past MI and (-) Cardiac Stents Normal cardiovascular exam+ dysrhythmias (-) Valvular Problems/Murmurs Rhythm:Regular Rate:Normal  TTE 2020: 1. Left ventricular ejection fraction, by visual estimation, is 60 to  65%. The left ventricle has normal function. There is no left ventricular  hypertrophy.  2. Left ventricular diastolic parameters are consistent with Grade I  diastolic dysfunction (impaired relaxation).  3. Global right ventricle has mildly reduced systolic function.The right  ventricular size is normal.  4. Left atrial size was normal.  5. Right atrial size was normal.  6. The mitral valve is normal in structure. No evidence of mitral valve  regurgitation. No evidence of mitral stenosis.  7. The tricuspid valve is normal in structure. Tricuspid valve  regurgitation is trivial.  8. The aortic valve is tricuspid. Aortic valve regurgitation is not  visualized. No evidence of aortic valve sclerosis or stenosis.  9. The pulmonic valve was grossly normal. Pulmonic valve regurgitation is  trivial.  10. Normal LV systolic function; grade 1 diastolic dysfunction; mild RV  dysfunction.    Neuro/Psych  Headaches, neg Seizures  PSYCHIATRIC DISORDERS Anxiety Depression     Neuromuscular disease    GI/Hepatic Neg liver ROS,,,s/p gastric bypass   Endo/Other  diabetes, Poorly Controlled, Type 2, Insulin Dependent, Oral Hypoglycemic AgentsHypothyroidism  Morbid obesity  Renal/GU CRFRenal disease  negative genitourinary   Musculoskeletal  (+) Arthritis ,  Fibromyalgia -  Abdominal  (+) + obese  Peds negative pediatric ROS (+)  Hematology negative hematology ROS (+)   Anesthesia Other Findings .Marland KitchenPast Medical History: 04/30/2019: Abscess of right buttock No date: Anemia No date: Anxiety No date: COVID-19 virus infection 2010: Depression No date: Diabetes mellitus 10/13/2019: DKA, type 2 (HCC) No date: Endometriosis No date: Headache, common migraine, intractable No date: History of kidney stones No date: History of ovarian cyst     Comment:  left sided, caused by her endometriosis (possibly an               endometrioma), in her 48s. No date: Hyperlipidemia No date: Hypertension No date: Kidney stones     Comment:  left No date: Morbid obesity (HCC)     Comment:  s/p gastric bypass 2010: Personal hx of gastric bypass No date: Rosacea No date: Vertigo   Reproductive/Obstetrics negative OB ROS                             Anesthesia Physical Anesthesia Plan  ASA: III  Anesthesia Plan: General   Post-op Pain Management:    Induction: Intravenous  PONV Risk Score and Plan: 3 and TIVA and Treatment may vary due to age or medical condition  Airway Management Planned: Natural Airway and Nasal Cannula  Additional Equipment: None  Intra-op Plan:   Post-operative Plan:   Informed Consent: I have reviewed the patients History and Physical, chart, labs and discussed the procedure including the risks, benefits and alternatives for the proposed anesthesia with the patient or authorized representative who has indicated his/her understanding and acceptance.     Dental  advisory given  Plan Discussed with: CRNA, Anesthesiologist and Surgeon  Anesthesia Plan Comments: (.)        Anesthesia Quick Evaluation

## 2023-06-06 NOTE — H&P (Signed)
Maria Minium, MD [facility] [address], [address] [place], [place] [address] Phone: [phone] Fax : [phone]  Primary Care Physician:  Sherlene Shams, MD Primary Gastroenterologist:  Dr. Servando Snare  Pre-Procedure History & Physical: HPI:  Maria Bentley is a 52 y.o. female is here for a screening colonoscopy.   Past Medical History:  Diagnosis Date   Abscess of right buttock 04/30/2019   Anemia    Anxiety    Arthritis    Cataract    CKD (chronic kidney disease), stage III (HCC)    COVID-19 virus infection    Depression 2010   Diabetes mellitus    DKA, type 2 (HCC) 10/13/2019   Dysrhythmia    Endometriosis    Family history of adverse reaction to anesthesia    Mother - heart stopped during several procedures using propofol   Fibromyalgia    Headache, common migraine, intractable    History of kidney stones    History of ovarian cyst    left sided, caused by her endometriosis (possibly an endometrioma), in her 48s.   Hyperlipidemia    Hypertension    Hypothyroidism    Kidney stones    left   Morbid obesity (HCC)    s/p gastric bypass   Motion sickness    long car rides   Personal hx of gastric bypass 2010   Rosacea    Vertigo     Past Surgical History:  Procedure Laterality Date   CARPAL TUNNEL RELEASE Right 11/26/2020   Procedure: CARPAL TUNNEL RELEASE;  Surgeon: Kennedy Bucker, MD;  Location: [facility];  Service: Orthopedics;  Laterality: Right;   CATARACT EXTRACTION W/PHACO Right 01/01/2023   Procedure: CATARACT EXTRACTION PHACO AND INTRAOCULAR LENS PLACEMENT (IOC) RIGHT DIABETIC;  Surgeon: Nevada Crane, MD;  Location: [facility];  Service: Ophthalmology;  Laterality: Right;  10.12 053.5   CATARACT EXTRACTION W/PHACO Left 01/15/2023   Procedure: CATARACT EXTRACTION PHACO AND INTRAOCULAR LENS PLACEMENT (IOC) LEFT DIABETIC 8.99 00:49.5;  Surgeon: Nevada Crane, MD;  Location: [facility];  Service: Ophthalmology;  Laterality: Left;   Diabetic   COLONOSCOPY WITH PROPOFOL N/A 10/21/2020   Procedure: COLONOSCOPY WITH PROPOFOL;  Surgeon: Pasty Spillers, MD;  Location: [facility];  Service: Endoscopy;  Laterality: N/A;   COLONOSCOPY WITH PROPOFOL N/A 09/15/2021   Procedure: COLONOSCOPY WITH PROPOFOL;  Surgeon: Pasty Spillers, MD;  Location: [facility];  Service: Endoscopy;  Laterality: N/A;   DILATION AND CURETTAGE OF UTERUS  12/11/2009   EYE SURGERY     gastric bypass     OPEN REDUCTION INTERNAL FIXATION (ORIF) DISTAL RADIAL FRACTURE Right 11/26/2020   Procedure: Open reduction internal fixation, right distal radius;  Surgeon: Kennedy Bucker, MD;  Location: [facility];  Service: Orthopedics;  Laterality: Right;   OVARIAN CYST REMOVAL     ROTATOR CUFF REPAIR     right   SHOULDER ARTHROSCOPY WITH SUBACROMIAL DECOMPRESSION AND OPEN ROTATOR C Right 07/19/2020   Procedure: Right shoulder arthroscopic vs mini-open rotator cuff repair vs Regeneten patch application, subacomial dcompression, and biceps tenodesis;  Surgeon: Signa Kell, MD;  Location: [facility];  Service: Orthopedics;  Laterality: Right;    Prior to Admission medications   Medication Sig Start Date End Date Taking? Authorizing Provider  albuterol (VENTOLIN HFA) 108 (90 Base) MCG/ACT inhaler Inhale 2 puffs into the lungs every 6 (six) hours as needed for wheezing or shortness of breath. 10/02/19  Yes Remus Loffler, PA-C  amitriptyline (ELAVIL) 50 MG tablet  Take 1 tablet (50 mg total) by mouth at bedtime. 05/16/23  Yes Sherlene Shams, MD  aspirin-acetaminophen-caffeine (EXCEDRIN MIGRAINE) [phone] MG tablet Take 2 tablets by mouth every 6 (six) hours as needed for headache.   Yes [provider]  atorvastatin (LIPITOR) 20 MG tablet Take 1 tablet (20 mg total) by mouth daily. 05/18/23  Yes Sherlene Shams, MD  Azelastine HCl 137 MCG/SPRAY SOLN  11/22/21  Yes [provider]  buPROPion (WELLBUTRIN XL) 150 MG 24 hr tablet Take 450 mg by  mouth daily.  05/12/18  Yes [provider]  CLINPRO 5000 1.1 % PSTE Place 1 application onto teeth at bedtime.  06/15/20  Yes [provider]  Continuous Glucose Sensor (FREESTYLE LIBRE 3 SENSOR) MISC Place 1 sensor on the skin every 14 days. Use to check glucose continuously 05/16/23  Yes Sherlene Shams, MD  cyanocobalamin (VITAMIN B12) 1000 MCG/ML injection Inject 1 mL intramuscularly once a week for 4 weeks and then once a month thereafter. 05/25/23  Yes Sherlene Shams, MD  cyclobenzaprine (FLEXERIL) 10 MG tablet Take 0.5-1 tablets (5-10 mg total) by mouth 3 (three) times daily as needed for muscle spasms. 06/05/23  Yes Sherlene Shams, MD  cyclobenzaprine (FLEXERIL) 5 MG tablet Take 1 tablet (5 mg total) by mouth 3 (three) times daily as needed. 03/21/23  Yes Menshew, Charlesetta Ivory, PA-C  diphenhydrAMINE (BENADRYL) 25 MG tablet Take 25 mg by mouth every 6 (six) hours as needed for allergies.   Yes [provider]  EPINEPHrine 0.3 mg/0.3 mL IJ SOAJ injection Inject 0.3 mg into the muscle as needed for anaphylaxis.  06/25/10  Yes [provider]  escitalopram (LEXAPRO) 20 MG tablet Take 20 mg by mouth daily.  02/27/17  Yes [provider]  fluticasone (FLONASE) 50 MCG/ACT nasal spray SPRAY 2 SPRAYS INTO EACH NOSTRIL EVERY DAY 04/21/21  Yes Sherlene Shams, MD  furosemide (LASIX) 20 MG tablet TAKE 1 TABLET (20 MG TOTAL) BY MOUTH DAILY. AS NEEDED FOR FLUID RETENTION 02/01/21  Yes Sherlene Shams, MD  HORIZANT 600 MG TBCR TAKE 1 TABLET BY MOUTH TWICE A DAY 11/17/22  Yes Sherlene Shams, MD  insulin aspart (NOVOLOG FLEXPEN) 100 UNIT/ML FlexPen Inject 20 Units into the skin 3 (three) times daily with meals. 05/16/23  Yes Sherlene Shams, MD  insulin glargine (LANTUS SOLOSTAR) 100 UNIT/ML Solostar Pen Inject 45 Units into the skin daily. 05/16/23  Yes Sherlene Shams, MD  Insulin Pen Needle (BD PEN NEEDLE NANO U/F) 32G X 4 MM MISC Please specify directions, refills and  quantity 05/31/22  Yes Sherlene Shams, MD  lamoTRIgine (LAMICTAL) 150 MG tablet Take 150 mg by mouth daily.   Yes [provider]  levothyroxine (SYNTHROID) 50 MCG tablet TAKE 1 TABLET BY MOUTH EVERY DAY 05/21/23  Yes Sherlene Shams, MD  lipase/protease/amylase (CREON) 36000 UNITS CPEP capsule TAKE 2 CAPSULES BY MOUTH 3 (THREE) TIMES DAILY BEFORE MEALS AND 1 CAPSULE WITH SNACKS. 05/14/23  Yes Celso Amy, PA-C  LORazepam (ATIVAN) 0.5 MG tablet Take 1 tablet (0.5 mg total) by mouth daily as needed for anxiety. 05/16/23  Yes Sherlene Shams, MD  losartan (COZAAR) 25 MG tablet Take 1 tablet (25 mg total) by mouth daily. 05/18/23 05/17/24 Yes Sherlene Shams, MD  meclizine (ANTIVERT) 25 MG tablet Take 1 tablet (25 mg total) by mouth 3 (three) times daily as needed for dizziness. 06/16/15  Yes Sherlene Shams, MD  Melatonin 10 MG TABS Take 10 mg by mouth at bedtime as needed (sleep).   Yes [provider]  metoprolol tartrate (LOPRESSOR) 25 MG tablet TAKE 0.5 TABLETS BY MOUTH 2 TIMES DAILY. 05/16/23  Yes Sherlene Shams, MD  NURTEC 75 MG TBDP Take 1 tablet by mouth daily as needed. 11/25/21  Yes [provider]  nystatin (MYCOSTATIN/NYSTOP) powder APPLY 1 APPLICATION TO AFFECTED AREA TWICE A DAY 12/23/20  Yes Glori Luis, MD  OnabotulinumtoxinA (BOTOX IJ) Inject as directed every 3 (three) months. For migraine   Yes [provider]  ondansetron (ZOFRAN ODT) 4 MG disintegrating tablet Take 1 tablet (4 mg total) by mouth every 8 (eight) hours as needed for nausea or vomiting. 10/03/21  Yes Chesley Noon, MD  ondansetron (ZOFRAN) 4 MG tablet Take 1 tablet (4 mg total) by mouth every 8 (eight) hours as needed for nausea or vomiting. 05/14/23 08/12/23 Yes Celso Amy, PA-C  polyethylene glycol powder (GLYCOLAX/MIRALAX) 17 GM/SCOOP powder Mix 1 capful in a drink once daily for Constipation. 05/14/23  Yes Celso Amy, PA-C  Sodium Sulfate-Mag Sulfate-KCl (SUTAB) [phone] MG  TABS Take 12 tablets by mouth in the morning and at bedtime. 05/14/23  Yes Gerre Pebbles, Tina, PA-C  SYRINGE-NEEDLE, DISP, 3 ML (BD INTEGRA SYRINGE) 25G X 1" 3 ML MISC FOR USE WITH B12 INJECTIONS WEEKLY/MONTHLY 05/25/23  Yes Sherlene Shams, MD    Allergies as of 05/14/2023 - Review Complete 05/14/2023  Allergen Reaction Noted   Amoxicillin Hives, Rash, Itching, and Swelling 02/05/2012   Avocado Swelling 05/29/2015   Citrullus vulgaris Hives and Swelling 04/02/2012   Clarithromycin Hives and Rash 02/05/2012   Clarithromycin Anaphylaxis, Hives, and Rash 02/05/2012   Honey Anaphylaxis 04/02/2012   Other Hives and Swelling 05/29/2015   Sulfa antibiotics Anaphylaxis 02/05/2012   Sulfa drugs cross reactors Anaphylaxis 02/05/2012   Watermelon concentrate Hives and Swelling 04/02/2012   Charentais melon (french melon)  04/02/2012   Insulin detemir Rash 06/11/2018   Silicone Itching and Rash 04/02/2012   Tape Itching and Rash 04/02/2012   Victoza [liraglutide] Nausea Only 06/11/2018    Family History  Problem Relation Age of Onset   Diabetes Mother    Hypertension Mother    Hypertension Father    Diabetes Father    Depression Other        strong hx of   Breast cancer Paternal Grandmother 71   Breast cancer Maternal Grandmother    Cancer Neg Hx     Social History   Socioeconomic History   Marital status: Married    Spouse name: Not on file   Number of children: Not on file   Years of education: Not on file   Highest education level: Master's degree (e.g., MA, MS, MEng, MEd, MSW, MBA)  Occupational History   Not on file  Tobacco Use   Smoking status: Former    Packs/day: 1.00    Years: 4.00    Additional pack years: 0.00    Total pack years: 4.00    Types: Cigarettes    Quit date: 02/05/1996    Years since quitting: 27.3   Smokeless tobacco: Never  Vaping Use   Vaping Use: Never used  Substance and Sexual Activity   Alcohol use: No   Drug use: No   Sexual activity: Yes     Birth control/protection: None  Other Topics Concern   Not on file  Social History Narrative   Not on file   Social Determinants of Health  Financial Resource Strain: Low Risk  (05/16/2023)   Overall Financial Resource Strain (CARDIA)    Difficulty of Paying Living Expenses: Not very hard  Food Insecurity: No Food Insecurity (05/16/2023)   Hunger Vital Sign    Worried About Running Out of Food in the Last Year: Never true    Ran Out of Food in the Last Year: Never true  Transportation Needs: Unmet Transportation Needs (05/16/2023)   PRAPARE - Administrator, Civil Service (Medical): Yes    Lack of Transportation (Non-Medical): No  Physical Activity: Unknown (05/16/2023)   Exercise Vital Sign    Days of Exercise per Week: 0 days    Minutes of Exercise per Session: Not on file  Stress: Stress Concern Present (05/16/2023)   [employer] of Occupational Health - Occupational Stress Questionnaire    Feeling of Stress : Rather much  Social Connections: Unknown (05/16/2023)   Social Connection and Isolation Panel [NHANES]    Frequency of Communication with Friends and Family: Twice a week    Frequency of Social Gatherings with Friends and Family: Patient declined    Attends Religious Services: Never    Database administrator or Organizations: No    Attends Engineer, structural: Not on file    Marital Status: Married  Catering manager Violence: Not on file    Review of Systems: See HPI, otherwise negative ROS  Physical Exam: BP (!) 149/87   Pulse 99   Temp 97.7 F (36.5 C) (Temporal)   Resp 18   Ht 5\' 4"  (1.626 m)   Wt 105.7 kg   LMP 05/09/2016 (LMP Unknown) Comment: D/C  SpO2 95%   BMI 39.99 kg/m  General:   Alert,  pleasant and cooperative in NAD Head:  Normocephalic and atraumatic. Neck:  Supple; no masses or thyromegaly. Lungs:  Clear throughout to auscultation.    Heart:  Regular rate and rhythm. Abdomen:  Soft, nontender and nondistended. Normal  bowel sounds, without guarding, and without rebound.   Neurologic:  Alert and  oriented x4;  grossly normal neurologically.  Impression/Plan: Maria Bentley is now here to undergo a screening colonoscopy.  Risks, benefits, and alternatives regarding colonoscopy have been reviewed with the patient.  Questions have been answered.  All parties agreeable.

## 2023-06-06 NOTE — Transfer of Care (Signed)
Immediate Anesthesia Transfer of Care Note  Patient: Rosalee Tolley  Procedure(s) Performed: COLONOSCOPY WITH PROPOFOL  Patient Location: PACU and Endoscopy Unit  Anesthesia Type:MAC  Level of Consciousness: awake  Airway & Oxygen Therapy: Patient Spontanous Breathing and Patient connected to nasal cannula oxygen  Post-op Assessment: Report given to RN and Post -op Vital signs reviewed and stable  Post vital signs: Reviewed and stable  Last Vitals:  Vitals Value Taken Time  BP    Temp    Pulse 84 06/06/23 0951  Resp 16 06/06/23 0951  SpO2 96 % 06/06/23 0951  Vitals shown include unvalidated device data.  Last Pain:  Vitals:   06/06/23 0900  TempSrc: Temporal  PainSc: 8          Complications: No notable events documented.

## 2023-06-07 ENCOUNTER — Encounter: Payer: Self-pay | Admitting: Gastroenterology

## 2023-06-07 NOTE — Anesthesia Postprocedure Evaluation (Signed)
Anesthesia Post Note  Patient: Maria Bentley  Procedure(s) Performed: COLONOSCOPY WITH PROPOFOL POLYPECTOMY  Patient location during evaluation: PACU Anesthesia Type: General Level of consciousness: awake and alert Pain management: pain level controlled Vital Signs Assessment: post-procedure vital signs reviewed and stable Respiratory status: spontaneous breathing, nonlabored ventilation and respiratory function stable Cardiovascular status: blood pressure returned to baseline and stable Postop Assessment: no apparent nausea or vomiting Anesthetic complications: no   No notable events documented.   Last Vitals:  Vitals:   06/06/23 0952 06/06/23 1002  BP: 110/74 128/68  Pulse: 84 79  Resp: 16 18  Temp:    SpO2: 96% 93%    Last Pain:  Vitals:   06/06/23 1002  TempSrc:   PainSc: 0-No pain                 Foye Deer

## 2023-06-21 ENCOUNTER — Other Ambulatory Visit: Payer: Self-pay | Admitting: Internal Medicine

## 2023-06-22 ENCOUNTER — Other Ambulatory Visit: Payer: Self-pay | Admitting: Internal Medicine

## 2023-06-25 ENCOUNTER — Other Ambulatory Visit: Payer: Self-pay | Admitting: Internal Medicine

## 2023-07-06 ENCOUNTER — Other Ambulatory Visit: Payer: Self-pay | Admitting: Internal Medicine

## 2023-07-06 NOTE — Telephone Encounter (Signed)
Medication was refilled on 05/16/2023. Is it okay to refuse?

## 2023-07-13 ENCOUNTER — Other Ambulatory Visit: Payer: Self-pay | Admitting: Internal Medicine

## 2023-07-16 NOTE — Telephone Encounter (Signed)
LOV 05/16/2023 NOV no follow up scheduled.

## 2023-07-22 ENCOUNTER — Other Ambulatory Visit: Payer: Self-pay | Admitting: Internal Medicine

## 2023-07-23 NOTE — Telephone Encounter (Signed)
Refilled: 11/18/2023 Last OV: 05/16/2023 Next OV: not scheduled

## 2023-08-04 ENCOUNTER — Other Ambulatory Visit: Payer: Self-pay | Admitting: Internal Medicine

## 2023-08-19 ENCOUNTER — Other Ambulatory Visit: Payer: Self-pay | Admitting: Internal Medicine

## 2023-08-31 ENCOUNTER — Other Ambulatory Visit: Payer: Self-pay | Admitting: Internal Medicine

## 2023-09-09 ENCOUNTER — Other Ambulatory Visit: Payer: Self-pay | Admitting: Internal Medicine

## 2023-09-12 ENCOUNTER — Encounter: Payer: Self-pay | Admitting: Internal Medicine

## 2023-09-13 MED ORDER — NOVOLOG FLEXPEN 100 UNIT/ML ~~LOC~~ SOPN: 20.0000 [IU] | PEN_INJECTOR | Freq: Three times a day (TID) | SUBCUTANEOUS | 0 refills | Status: DC

## 2023-10-08 ENCOUNTER — Encounter: Payer: Self-pay | Admitting: Internal Medicine

## 2023-10-09 MED ORDER — NOVOLOG FLEXPEN 100 UNIT/ML ~~LOC~~ SOPN: 20.0000 [IU] | PEN_INJECTOR | Freq: Three times a day (TID) | SUBCUTANEOUS | 1 refills | Status: DC

## 2023-10-15 ENCOUNTER — Other Ambulatory Visit: Payer: Self-pay

## 2023-10-15 ENCOUNTER — Encounter: Payer: Self-pay | Admitting: Internal Medicine

## 2023-10-15 ENCOUNTER — Telehealth: Payer: Self-pay | Admitting: *Deleted

## 2023-10-15 ENCOUNTER — Ambulatory Visit: Payer: BC Managed Care – PPO | Admitting: Internal Medicine

## 2023-10-15 ENCOUNTER — Observation Stay
Admission: EM | Admit: 2023-10-15 | Discharge: 2023-10-18 | Disposition: A | Discharge status: Home / Self Care | Payer: BC Managed Care – PPO | Source: Ambulatory Visit | Attending: Internal Medicine | Admitting: Internal Medicine

## 2023-10-15 ENCOUNTER — Emergency Department: Payer: BC Managed Care – PPO

## 2023-10-15 VITALS — BP 142/68 | HR 130 | Ht 64.0 in | Wt 232.0 lb

## 2023-10-15 DIAGNOSIS — I444 Left anterior fascicular block: Secondary | ICD-10-CM | POA: Diagnosis present

## 2023-10-15 DIAGNOSIS — Z8616 Personal history of COVID-19: Secondary | ICD-10-CM

## 2023-10-15 DIAGNOSIS — E7800 Pure hypercholesterolemia, unspecified: Secondary | ICD-10-CM | POA: Diagnosis not present

## 2023-10-15 DIAGNOSIS — Z888 Allergy status to other drugs, medicaments and biological substances status: Secondary | ICD-10-CM

## 2023-10-15 DIAGNOSIS — D508 Other iron deficiency anemias: Secondary | ICD-10-CM | POA: Diagnosis not present

## 2023-10-15 DIAGNOSIS — Z87440 Personal history of urinary (tract) infections: Secondary | ICD-10-CM

## 2023-10-15 DIAGNOSIS — Z881 Allergy status to other antibiotic agents status: Secondary | ICD-10-CM

## 2023-10-15 DIAGNOSIS — Z833 Family history of diabetes mellitus: Secondary | ICD-10-CM

## 2023-10-15 DIAGNOSIS — N183 Chronic kidney disease, stage 3 unspecified: Secondary | ICD-10-CM | POA: Diagnosis not present

## 2023-10-15 DIAGNOSIS — N182 Chronic kidney disease, stage 2 (mild): Secondary | ICD-10-CM

## 2023-10-15 DIAGNOSIS — E1122 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 disease: Secondary | ICD-10-CM | POA: Diagnosis present

## 2023-10-15 DIAGNOSIS — D509 Iron deficiency anemia, unspecified: Principal | ICD-10-CM | POA: Insufficient documentation

## 2023-10-15 DIAGNOSIS — Z87442 Personal history of urinary calculi: Secondary | ICD-10-CM | POA: Diagnosis not present

## 2023-10-15 DIAGNOSIS — Z818 Family history of other mental and behavioral disorders: Secondary | ICD-10-CM

## 2023-10-15 DIAGNOSIS — D631 Anemia in chronic kidney disease: Secondary | ICD-10-CM | POA: Diagnosis not present

## 2023-10-15 DIAGNOSIS — I10 Essential (primary) hypertension: Secondary | ICD-10-CM | POA: Diagnosis not present

## 2023-10-15 DIAGNOSIS — N179 Acute kidney failure, unspecified: Secondary | ICD-10-CM | POA: Insufficient documentation

## 2023-10-15 DIAGNOSIS — Z6841 Body Mass Index (BMI) 40.0 and over, adult: Secondary | ICD-10-CM

## 2023-10-15 DIAGNOSIS — E785 Hyperlipidemia, unspecified: Secondary | ICD-10-CM | POA: Diagnosis present

## 2023-10-15 DIAGNOSIS — Z87891 Personal history of nicotine dependence: Secondary | ICD-10-CM | POA: Diagnosis not present

## 2023-10-15 DIAGNOSIS — Z8249 Family history of ischemic heart disease and other diseases of the circulatory system: Secondary | ICD-10-CM

## 2023-10-15 DIAGNOSIS — Z79899 Other long term (current) drug therapy: Secondary | ICD-10-CM | POA: Diagnosis not present

## 2023-10-15 DIAGNOSIS — I129 Hypertensive chronic kidney disease with stage 1 through stage 4 chronic kidney disease, or unspecified chronic kidney disease: Secondary | ICD-10-CM | POA: Diagnosis not present

## 2023-10-15 DIAGNOSIS — Z79890 Hormone replacement therapy (postmenopausal): Secondary | ICD-10-CM | POA: Diagnosis not present

## 2023-10-15 DIAGNOSIS — M797 Fibromyalgia: Secondary | ICD-10-CM | POA: Diagnosis present

## 2023-10-15 DIAGNOSIS — D500 Iron deficiency anemia secondary to blood loss (chronic): Secondary | ICD-10-CM | POA: Diagnosis present

## 2023-10-15 DIAGNOSIS — I2510 Atherosclerotic heart disease of native coronary artery without angina pectoris: Secondary | ICD-10-CM | POA: Diagnosis not present

## 2023-10-15 DIAGNOSIS — Z791 Long term (current) use of non-steroidal anti-inflammatories (NSAID): Secondary | ICD-10-CM | POA: Insufficient documentation

## 2023-10-15 DIAGNOSIS — D649 Anemia, unspecified: Secondary | ICD-10-CM | POA: Diagnosis present

## 2023-10-15 DIAGNOSIS — D75839 Thrombocytosis, unspecified: Secondary | ICD-10-CM | POA: Diagnosis present

## 2023-10-15 DIAGNOSIS — F32A Depression, unspecified: Secondary | ICD-10-CM | POA: Diagnosis present

## 2023-10-15 DIAGNOSIS — K909 Intestinal malabsorption, unspecified: Secondary | ICD-10-CM

## 2023-10-15 DIAGNOSIS — E66813 Obesity, class 3: Secondary | ICD-10-CM | POA: Diagnosis present

## 2023-10-15 DIAGNOSIS — F419 Anxiety disorder, unspecified: Secondary | ICD-10-CM | POA: Diagnosis not present

## 2023-10-15 DIAGNOSIS — M1990 Unspecified osteoarthritis, unspecified site: Secondary | ICD-10-CM | POA: Diagnosis not present

## 2023-10-15 DIAGNOSIS — K922 Gastrointestinal hemorrhage, unspecified: Secondary | ICD-10-CM | POA: Diagnosis not present

## 2023-10-15 DIAGNOSIS — Z803 Family history of malignant neoplasm of breast: Secondary | ICD-10-CM

## 2023-10-15 DIAGNOSIS — D72829 Elevated white blood cell count, unspecified: Secondary | ICD-10-CM | POA: Diagnosis present

## 2023-10-15 DIAGNOSIS — Z794 Long term (current) use of insulin: Secondary | ICD-10-CM

## 2023-10-15 DIAGNOSIS — Z7982 Long term (current) use of aspirin: Secondary | ICD-10-CM

## 2023-10-15 DIAGNOSIS — Z882 Allergy status to sulfonamides status: Secondary | ICD-10-CM

## 2023-10-15 DIAGNOSIS — E039 Hypothyroidism, unspecified: Secondary | ICD-10-CM | POA: Diagnosis not present

## 2023-10-15 DIAGNOSIS — Z9884 Bariatric surgery status: Secondary | ICD-10-CM

## 2023-10-15 DIAGNOSIS — E119 Type 2 diabetes mellitus without complications: Secondary | ICD-10-CM

## 2023-10-15 DIAGNOSIS — R197 Diarrhea, unspecified: Secondary | ICD-10-CM

## 2023-10-15 DIAGNOSIS — Z880 Allergy status to penicillin: Secondary | ICD-10-CM

## 2023-10-15 DIAGNOSIS — D62 Acute posthemorrhagic anemia: Secondary | ICD-10-CM | POA: Diagnosis not present

## 2023-10-15 DIAGNOSIS — I1310 Hypertensive heart and chronic kidney disease without heart failure, with stage 1 through stage 4 chronic kidney disease, or unspecified chronic kidney disease: Secondary | ICD-10-CM | POA: Diagnosis present

## 2023-10-15 DIAGNOSIS — N1831 Chronic kidney disease, stage 3a: Secondary | ICD-10-CM | POA: Diagnosis present

## 2023-10-15 DIAGNOSIS — Z860100 Personal history of colon polyps, unspecified: Secondary | ICD-10-CM

## 2023-10-15 DIAGNOSIS — E538 Deficiency of other specified B group vitamins: Secondary | ICD-10-CM

## 2023-10-15 LAB — COMPREHENSIVE METABOLIC PANEL
ALT: 15 U/L (ref 0–35)
AST: 15 U/L (ref 0–37)
Albumin: 3.5 g/dL (ref 3.5–5.2)
Alkaline Phosphatase: 87 U/L (ref 39–117)
BUN: 15 mg/dL (ref 6–23)
CO2: 27 meq/L (ref 19–32)
Calcium: 9.4 mg/dL (ref 8.4–10.5)
Chloride: 102 meq/L (ref 96–112)
Creatinine, Ser: 1.13 mg/dL (ref 0.40–1.20)
GFR: 55.65 mL/min — ABNORMAL LOW (ref >60.00)
Glucose, Bld: 176 mg/dL — ABNORMAL HIGH (ref 70–99)
Potassium: 4.5 meq/L (ref 3.5–5.1)
Sodium: 138 meq/L (ref 135–145)
Total Bilirubin: 0.3 mg/dL (ref 0.2–1.2)
Total Protein: 6.9 g/dL (ref 6.0–8.3)

## 2023-10-15 LAB — CBC WITH DIFFERENTIAL/PLATELET
Basophils Absolute: 0.1 10*3/uL (ref 0.0–0.1)
Basophils Relative: 1 % (ref 0.0–3.0)
Eosinophils Absolute: 0.2 10*3/uL (ref 0.0–0.7)
Eosinophils Relative: 2.4 % (ref 0.0–5.0)
HCT: 27.1 % — ABNORMAL LOW (ref 36.0–46.0)
Hemoglobin: 7.1 g/dL — CL (ref 12.0–15.0)
Lymphocytes Relative: 25 % (ref 12.0–46.0)
Lymphs Abs: 2.2 10*3/uL (ref 0.7–4.0)
MCHC: 25.4 g/dL — ABNORMAL LOW (ref 30.0–36.0)
MCV: 71.4 fL — ABNORMAL LOW (ref 78.0–100.0)
Monocytes Absolute: 0.5 10*3/uL (ref 0.1–1.0)
Monocytes Relative: 5.7 % (ref 3.0–12.0)
Neutro Abs: 5.7 10*3/uL (ref 1.4–7.7)
Neutrophils Relative %: 65.9 % (ref 43.0–77.0)
Platelets: 527 10*3/uL — ABNORMAL HIGH (ref 150.0–400.0)
RBC: 3.79 Mil/uL — ABNORMAL LOW (ref 3.87–5.11)
RDW: 22.6 % — ABNORMAL HIGH (ref 11.5–15.5)
WBC: 8.6 10*3/uL (ref 4.0–10.5)

## 2023-10-15 LAB — BASIC METABOLIC PANEL
Anion gap: 10 (ref 5–15)
BUN: 18 mg/dL (ref 6–20)
CO2: 25 mmol/L (ref 22–32)
Calcium: 9 mg/dL (ref 8.9–10.3)
Chloride: 104 mmol/L (ref 98–111)
Creatinine, Ser: 1.26 mg/dL — ABNORMAL HIGH (ref 0.44–1.00)
GFR, Estimated: 51 mL/min — ABNORMAL LOW (ref >60)
Glucose, Bld: 87 mg/dL (ref 70–99)
Potassium: 4.5 mmol/L (ref 3.5–5.1)
Sodium: 139 mmol/L (ref 135–145)

## 2023-10-15 LAB — PREPARE RBC (CROSSMATCH)

## 2023-10-15 LAB — LDL CHOLESTEROL, DIRECT: Direct LDL: 53 mg/dL

## 2023-10-15 LAB — POCT GLYCOSYLATED HEMOGLOBIN (HGB A1C): Hemoglobin A1C: 7.2 % — AB (ref 4.0–5.6)

## 2023-10-15 LAB — LIPID PANEL
Cholesterol: 108 mg/dL (ref 0–200)
HDL: 42.4 mg/dL (ref >39.00)
LDL Cholesterol: 45 mg/dL (ref 0–99)
NonHDL: 65.2
Total CHOL/HDL Ratio: 3
Triglycerides: 99 mg/dL (ref 0.0–149.0)
VLDL: 19.8 mg/dL (ref 0.0–40.0)

## 2023-10-15 LAB — CBC
HCT: 28.8 % — ABNORMAL LOW (ref 36.0–46.0)
Hemoglobin: 7.1 g/dL — ABNORMAL LOW (ref 12.0–15.0)
MCH: 18.9 pg — ABNORMAL LOW (ref 26.0–34.0)
MCHC: 24.7 g/dL — ABNORMAL LOW (ref 30.0–36.0)
MCV: 76.8 fL — ABNORMAL LOW (ref 80.0–100.0)
Platelets: 535 10*3/uL — ABNORMAL HIGH (ref 150–400)
RBC: 3.75 MIL/uL — ABNORMAL LOW (ref 3.87–5.11)
RDW: 21.5 % — ABNORMAL HIGH (ref 11.5–15.5)
WBC: 11.1 10*3/uL — ABNORMAL HIGH (ref 4.0–10.5)
nRBC: 0.3 % — ABNORMAL HIGH (ref 0.0–0.2)

## 2023-10-15 MED ORDER — PANTOPRAZOLE 80MG IVPB - SIMPLE MED: 80.0000 mg | Freq: Once | INTRAVENOUS | Status: DC

## 2023-10-15 MED ORDER — ATORVASTATIN CALCIUM 20 MG PO TABS: 20.0000 mg | ORAL_TABLET | Freq: Every day | ORAL | 1 refills | Status: DC

## 2023-10-15 MED ORDER — CYANOCOBALAMIN 1000 MCG/ML IJ SOLN: 1000.0000 ug | INTRAMUSCULAR | 1 refills | Status: DC

## 2023-10-15 MED ORDER — PANTOPRAZOLE SODIUM 40 MG IV SOLR
40.0000 mg | Freq: Two times a day (BID) | INTRAVENOUS | Status: DC
  Administered 2023-10-16 – 2023-10-18 (×5): 40 mg via INTRAVENOUS
  Filled 2023-10-15 (×5): qty 10

## 2023-10-15 MED ORDER — LEVOTHYROXINE SODIUM 50 MCG PO TABS: 50.0000 ug | ORAL_TABLET | Freq: Every day | ORAL | 0 refills | Status: DC

## 2023-10-15 MED ORDER — PANTOPRAZOLE INFUSION (NEW) - SIMPLE MED: 8.0000 mg/h | INTRAVENOUS | Status: DC

## 2023-10-15 MED ORDER — PANTOPRAZOLE SODIUM 40 MG IV SOLR
40.0000 mg | INTRAVENOUS | Status: AC
  Administered 2023-10-16 (×2): 40 mg via INTRAVENOUS
  Filled 2023-10-15 (×2): qty 10

## 2023-10-15 MED ORDER — METOPROLOL TARTRATE 25 MG PO TABS: ORAL_TABLET | ORAL | 3 refills | Status: DC

## 2023-10-15 MED ORDER — SODIUM CHLORIDE 0.9% FLUSH
10.0000 mL | Freq: Two times a day (BID) | INTRAVENOUS | Status: DC
  Administered 2023-10-16 – 2023-10-18 (×6): 10 mL via INTRAVENOUS

## 2023-10-15 NOTE — Progress Notes (Signed)
Subjective:  Patient ID: Maria Bentley, female    DOB: Dec 01, 1971  Age: 52 y.o. MRN: [id number]  CC: The primary encounter diagnosis was Primary hypertension. Diagnoses of Type 2 diabetes mellitus with stage 2 chronic kidney disease, without long-term current use of insulin (HCC), Pure hypercholesterolemia, Other iron deficiency anemia, B12 deficiency, and Diarrhea due to malabsorption were also pertinent to this visit.   HPI Maria Bentley presents for  Chief Complaint  Patient presents with  . Medical Management of Chronic Issues   1) uncontrolled type 2 DM:  last seen in June.  Freestyle libre 3  given and asked to return in one month .   Returns today . Has been taking 40 units of basal insulin and using 2 a sliding scale dose of novolog per meal,   I have downloaded and reviewed the data from patient's continuous blood glucose monitor. Patient's  sugars have been  IN RANGE  63   % OF THE TIME,   BELOW RANGE 1   % of the time.  And ABOVE RANGE 27 % OF THE TIME .  Medication changes were made based on this review as follows:  2) Back Pain:  was referred for PT  by NP Tyler Deis but has not had any sessions yet due to personal schedule conflicts.  Trying on her own to exercise but walking aggravates her sciatica   3)  anemia:  last known hgb was 10 . 17 Nov 2021.  Colonoscopy done Oct 2022 was difficult due to inadequate prep. Repeat in 4 months was recommended but not done. Path report was negative for microscopic colitis .  She has b12 deficiency managed with parental supplementation   Lab Results  Component Value Date   WBC 11.1 (H) 10/15/2023   HGB 7.1 (L) 10/15/2023   HCT 28.8 (L) 10/15/2023   MCV 76.8 (L) 10/15/2023   PLT 535 (H) 10/15/2023     Outpatient Medications Prior to Visit  Medication Sig Dispense Refill  . albuterol (VENTOLIN HFA) 108 (90 Base) MCG/ACT inhaler Inhale 2 puffs into the lungs every 6 (six) hours as needed for wheezing or shortness of breath.  18 g 0  . amitriptyline (ELAVIL) 25 MG tablet TAKE 1 TABLET BY MOUTH EVERYDAY AT BEDTIME 90 tablet 1  . amitriptyline (ELAVIL) 50 MG tablet TAKE 1 TABLET BY MOUTH EVERYDAY AT BEDTIME 90 tablet 3  . aspirin-acetaminophen-caffeine (EXCEDRIN MIGRAINE) 250-250-65 MG tablet Take 2 tablets by mouth every 6 (six) hours as needed for headache.    . Azelastine HCl 137 MCG/SPRAY SOLN     . buPROPion (WELLBUTRIN XL) 150 MG 24 hr tablet Take 450 mg by mouth daily.   3  . CLINPRO 5000 1.1 % PSTE Place 1 application onto teeth at bedtime.     . Continuous Glucose Sensor (FREESTYLE LIBRE 3 SENSOR) MISC Place 1 sensor on the skin every 14 days. Use to check glucose continuously 2 each 5  . cyclobenzaprine (FLEXERIL) 10 MG tablet Take 0.5-1 tablets (5-10 mg total) by mouth 3 (three) times daily as needed for muscle spasms. 30 tablet 12  . cyclobenzaprine (FLEXERIL) 5 MG tablet Take 1 tablet (5 mg total) by mouth 3 (three) times daily as needed. 15 tablet 0  . diphenhydrAMINE (BENADRYL) 25 MG tablet Take 25 mg by mouth every 6 (six) hours as needed for allergies.    Marland Kitchen EPINEPHrine 0.3 mg/0.3 mL IJ SOAJ injection Inject 0.3 mg into the muscle as needed for anaphylaxis.     Marland Kitchen  escitalopram (LEXAPRO) 20 MG tablet Take 20 mg by mouth daily.     . fluticasone (FLONASE) 50 MCG/ACT nasal spray SPRAY 2 SPRAYS INTO EACH NOSTRIL EVERY DAY 48 mL 1  . furosemide (LASIX) 20 MG tablet TAKE 1 TABLET (20 MG TOTAL) BY MOUTH DAILY. AS NEEDED FOR FLUID RETENTION 90 tablet 1  . HORIZANT 600 MG TBCR TAKE 1 TABLET BY MOUTH TWICE A DAY 60 tablet 5  . insulin aspart (NOVOLOG FLEXPEN) 100 UNIT/ML FlexPen Inject 20 Units into the skin 3 (three) times daily with meals. INJECT 20 UNITS INTO THE SKIN 3 (THREE) TIMES DAILY WITH MEALS. 30 mL 1  . insulin glargine (LANTUS SOLOSTAR) 100 UNIT/ML Solostar Pen Inject 45 Units into the skin daily. 15 mL PRN  . Insulin Pen Needle (BD PEN NEEDLE NANO U/F) 32G X 4 MM MISC Please specify directions, refills  and quantity 100 each 11  . lamoTRIgine (LAMICTAL) 100 MG tablet Take 100 mg by mouth 2 (two) times daily.    . lipase/protease/amylase (CREON) 36000 UNITS CPEP capsule TAKE 2 CAPSULES BY MOUTH 3 (THREE) TIMES DAILY BEFORE MEALS AND 1 CAPSULE WITH SNACKS. 300 capsule 12  . LORazepam (ATIVAN) 0.5 MG tablet Take 1 tablet (0.5 mg total) by mouth daily as needed for anxiety. 30 tablet 0  . losartan (COZAAR) 25 MG tablet TAKE 1 TABLET (25 MG TOTAL) BY MOUTH DAILY. 90 tablet 1  . meclizine (ANTIVERT) 25 MG tablet Take 1 tablet (25 mg total) by mouth 3 (three) times daily as needed for dizziness. 90 tablet 6  . Melatonin 10 MG TABS Take 10 mg by mouth at bedtime as needed (sleep).    . NURTEC 75 MG TBDP Take 1 tablet by mouth daily as needed.    . nystatin (MYCOSTATIN/NYSTOP) powder APPLY 1 APPLICATION TO AFFECTED AREA TWICE A DAY 60 g 0  . OnabotulinumtoxinA (BOTOX IJ) Inject as directed every 3 (three) months. For migraine    . ondansetron (ZOFRAN ODT) 4 MG disintegrating tablet Take 1 tablet (4 mg total) by mouth every 8 (eight) hours as needed for nausea or vomiting. 12 tablet 0  . polyethylene glycol powder (GLYCOLAX/MIRALAX) 17 GM/SCOOP powder Mix 1 capful in a drink once daily for Constipation. 255 g 3  . SYRINGE-NEEDLE, DISP, 3 ML (BD INTEGRA SYRINGE) 25G X 1" 3 ML MISC FOR USE WITH B12 INJECTIONS WEEKLY/MONTHLY 25 each 0  . atorvastatin (LIPITOR) 20 MG tablet Take 1 tablet (20 mg total) by mouth daily. 90 tablet 1  . cyanocobalamin (VITAMIN B12) 1000 MCG/ML injection Inject 1 mL intramuscularly once a week for 4 weeks and then once a month thereafter. 10 mL 1  . levothyroxine (SYNTHROID) 50 MCG tablet TAKE 1 TABLET BY MOUTH EVERY DAY 90 tablet 0  . metoprolol tartrate (LOPRESSOR) 25 MG tablet TAKE 0.5 TABLETS BY MOUTH 2 TIMES DAILY. 90 tablet 3  . lamoTRIgine (LAMICTAL) 150 MG tablet Take 150 mg by mouth daily.    . Sodium Sulfate-Mag Sulfate-KCl (SUTAB) [phone] MG TABS Take 12 tablets by  mouth in the morning and at bedtime. 24 tablet 0   No facility-administered medications prior to visit.    Review of Systems;  Patient denies headache, fevers, malaise, unintentional weight loss, skin rash, eye pain, sinus congestion and sinus pain, sore throat, dysphagia,  hemoptysis , cough, dyspnea, wheezing, chest pain, palpitations, orthopnea, edema, abdominal pain, nausea, melena, diarrhea, constipation, flank pain, dysuria, hematuria, urinary  Frequency, nocturia, numbness, tingling, seizures,  Focal weakness, Loss of  consciousness,  Tremor, insomnia, depression, anxiety, and suicidal ideation.      Objective:  BP (!) 142/68   Pulse (!) 130   Ht 5\' 4"  (1.626 m)   Wt 232 lb (105.2 kg)   LMP 05/09/2016 (LMP Unknown) Comment: D/C  SpO2 95%   BMI 39.82 kg/m   BP Readings from Last 3 Encounters:  10/15/23 (!) 156/79  10/15/23 (!) 142/68  06/06/23 128/68    Wt Readings from Last 3 Encounters:  10/15/23 232 lb (105.2 kg)  10/15/23 232 lb (105.2 kg)  06/06/23 233 lb (105.7 kg)    Physical Exam Vitals reviewed.  Constitutional:      General: She is not in acute distress.    Appearance: Normal appearance. She is normal weight. She is not ill-appearing, toxic-appearing or diaphoretic.  HENT:     Head: Normocephalic.  Eyes:     General: No scleral icterus.       Right eye: No discharge.        Left eye: No discharge.     Conjunctiva/sclera: Conjunctivae normal.  Cardiovascular:     Rate and Rhythm: Normal rate and regular rhythm.     Heart sounds: Normal heart sounds.  Pulmonary:     Effort: Pulmonary effort is normal. No respiratory distress.     Breath sounds: Normal breath sounds.  Musculoskeletal:        General: Normal range of motion.  Skin:    General: Skin is warm and dry.  Neurological:     General: No focal deficit present.     Mental Status: She is alert and oriented to person, place, and time. Mental status is at baseline.  Psychiatric:        Mood  and Affect: Mood normal.        Behavior: Behavior normal.        Thought Content: Thought content normal.        Judgment: Judgment normal.   Lab Results  Component Value Date   HGBA1C 7.2 (A) 10/15/2023   HGBA1C 9.5 (A) 05/16/2023   HGBA1C 10.5 (H) 06/02/2022    Lab Results  Component Value Date   CREATININE 1.26 (H) 10/15/2023   CREATININE 1.13 10/15/2023   CREATININE 1.03 05/16/2023    Lab Results  Component Value Date   WBC 11.1 (H) 10/15/2023   HGB 7.1 (L) 10/15/2023   HCT 28.8 (L) 10/15/2023   PLT 535 (H) 10/15/2023   GLUCOSE 87 10/15/2023   CHOL 108 10/15/2023   TRIG 99.0 10/15/2023   HDL 42.40 10/15/2023   LDLDIRECT 53.0 10/15/2023   LDLCALC 45 10/15/2023   ALT 15 10/15/2023   AST 15 10/15/2023   NA 139 10/15/2023   K 4.5 10/15/2023   CL 104 10/15/2023   CREATININE 1.26 (H) 10/15/2023   BUN 18 10/15/2023   CO2 25 10/15/2023   TSH 1.46 05/16/2023   INR 0.9 10/25/2019   HGBA1C 7.2 (A) 10/15/2023   MICROALBUR 2.3 (H) 05/16/2023    No results found.  Assessment & Plan:  .Primary hypertension -     Comprehensive metabolic panel  Type 2 diabetes mellitus with stage 2 chronic kidney disease, without long-term current use of insulin (HCC) Assessment & Plan: Improved control with use of CBG for frequent monitoring. I have downloaded and reviewed the data from patient's continuous blood glucose monitor. Patient's  sugars have been  IN RANGE   63  % OF THE TIME,   BELOW RANGE  1  % of  the time.  And ABOVE RANGE   % OF THE TIME .  Medication changes were made based on this review as follows: reduced novolog mealtime dose to 25 units  maximum dose .  Continue 40 units of Tresiba daily and  return in one month   Orders: -     POCT glycosylated hemoglobin (Hb A1C) -     Comprehensive metabolic panel  Pure hypercholesterolemia -     Lipid panel -     LDL cholesterol, direct  Other iron deficiency anemia Assessment & Plan: Recurrent ,presumed based on today's  microcytic anemia. Unclear how rapid the drop has been due to lack of follow up.  Patient directed to ER for urgent assessment of need for transfusion   Lab Results  Component Value Date   WBC 11.1 (H) 10/15/2023   HGB 7.1 (L) 10/15/2023   HCT 28.8 (L) 10/15/2023   MCV 76.8 (L) 10/15/2023   PLT 535 (H) 10/15/2023     Orders: -     CBC with Differential/Platelet  B12 deficiency Assessment & Plan: Noted in June 2024,  IF AB negative. Managed with IM injections since diagnosis   Lab Results  Component Value Date   VITAMINB12 182 (L) 05/16/2023      Diarrhea due to malabsorption Assessment & Plan: Secondary to pancreatic insufficiency.    Other orders -     Atorvastatin Calcium; Take 1 tablet (20 mg total) by mouth daily.  Dispense: 90 tablet; Refill: 1 -     Cyanocobalamin; Inject 1 mL (1,000 mcg total) into the muscle every 30 (thirty) days. Inject 1 mL intramuscularly once a week for 4 weeks and then once a month thereafter.  Dispense: 10 mL; Refill: 1 -     Levothyroxine Sodium; Take 1 tablet (50 mcg total) by mouth daily.  Dispense: 90 tablet; Refill: 0 -     Metoprolol Tartrate; TAKE 0.5 TABLETS BY MOUTH 2 TIMES DAILY.  Dispense: 90 tablet; Refill: 3     I provided 30 minutes of face-to-face time during this encounter reviewing patient's last visit with me, patient's  most recent visit with cardiology,  nephrology,  and neurology,  recent surgical and non surgical procedures, previous  labs and imaging studies, counseling on currently addressed issues,  and post visit ordering to diagnostics and therapeutics .   Follow-up: No follow-ups on file.   Sherlene Shams, MD

## 2023-10-15 NOTE — Telephone Encounter (Signed)
Pt is aware and and stated that she will pick her husband up from work and go to the ED. Triage nurse at [facility] has been notified.

## 2023-10-15 NOTE — Assessment & Plan Note (Addendum)
Recurrent ,presumed based on today's microcytic anemia. Unclear how rapid the drop has been due to lack of follow up.  Patient directed to ER for urgent assessment of need for transfusion   Lab Results  Component Value Date   WBC 11.1 (H) 10/15/2023   HGB 7.1 (L) 10/15/2023   HCT 28.8 (L) 10/15/2023   MCV 76.8 (L) 10/15/2023   PLT 535 (H) 10/15/2023

## 2023-10-15 NOTE — ED Provider Notes (Incomplete)
[facility] Provider Note    Event Date/Time   First MD Initiated Contact with Patient 10/15/23 2322     (approximate)   History   Abnormal Lab   HPI {Remember to add pertinent medical, surgical, social, and/or OB history to HPI:1} Maria Bentley is a 52 y.o. female  ***       Past Medical History   Past Medical History:  Diagnosis Date   Abscess of right buttock 04/30/2019   Anemia    Anxiety    Arthritis    Cataract    CKD (chronic kidney disease), stage III (HCC)    COVID-19 virus infection    Depression 2010   Diabetes mellitus    DKA, type 2 (HCC) 10/13/2019   Dysrhythmia    Endometriosis    Family history of adverse reaction to anesthesia    Mother - heart stopped during several procedures using propofol   Fibromyalgia    Headache, common migraine, intractable    History of kidney stones    History of ovarian cyst    left sided, caused by her endometriosis (possibly an endometrioma), in her 22s.   Hyperlipidemia    Hypertension    Hypothyroidism    Kidney stones    left   Morbid obesity (HCC)    s/p gastric bypass   Motion sickness    long car rides   Personal hx of gastric bypass 2010   Rosacea    Vertigo      Active Problem List   Patient Active Problem List   Diagnosis Date Noted   Polyp of sigmoid colon 06/06/2023   Colon cancer screening 06/06/2023   B12 deficiency 05/18/2023   Knee pain 12/23/2022   Other symptoms and signs involving cognitive functions and awareness 09/14/2022   Radiculitis, cervical 11/28/2021   COVID-19 long hauler manifesting chronic fatigue 11/28/2021   History of UTI 11/28/2021   Polyp of transverse colon    COVID-19 long hauler manifesting chronic concentration deficit 07/09/2021   Encounter for screening colonoscopy    Pancreatic insufficiency 09/04/2020   Diarrhea 09/04/2020   Hematuria 08/25/2020   Stage 3a chronic kidney disease (HCC) 08/25/2020   Analgesic overuse  headache 08/24/2020   Atypical facial pain 08/24/2020   Bruxism (teeth grinding) 08/24/2020   Cervical syndrome 08/24/2020   Disorder of autonomic nervous system 08/24/2020   Fibromyositis 08/24/2020   Temporomandibular joint disorder 08/24/2020   Hypoglycemic reaction 08/21/2020   Coronary atherosclerosis due to calcified coronary lesion 08/21/2020   Renal mass, left 07/06/2020   Chronic right shoulder pain 02/09/2020   Recurrent headache 01/13/2020   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 12/30/2019   Elevated alkaline phosphatase level 11/21/2019   Hospital discharge follow-up 11/04/2019   Cervical spondylosis without myelopathy 10/29/2019   Pressure injury of skin 10/16/2019   History of gastric bypass 10/13/2019   COVID-19 virus infection 10/06/2019   Preoperative evaluation of a medical condition to rule out surgical contraindications (TAR required) 09/21/2019   Morbid obesity with BMI of 40.0-44.9, adult (HCC) 06/24/2019   History of bariatric surgery 04/15/2019   Depressive disorder 11/12/2018   Fibromyalgia 06/05/2018   Iron deficiency anemia 06/05/2018   Hypertension 11/24/2017   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 disease (HCC) 05/15/2017   Cervico-occipital neuralgia 05/02/2017   Low back pain 02/02/2017   Hip pain, chronic 12/28/2015   Pain in joint, ankle and foot 12/28/2015   Insomnia due to psychological stress  09/09/2015   Nonadherence to medical treatment 06/08/2015   Right hip pain 05/30/2014   Vitamin D deficiency 05/30/2014   Tachycardia 08/15/2013   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/2013   Sinusitis, acute 03/20/2013   Morbid obesity (HCC) 06/09/2012   Anxiety    Headache, common migraine, intractable    Hyperlipidemia    Hypothyroid    OSA (obstructive sleep apnea) 02/05/2012     Past Surgical History   Past Surgical History:  Procedure Laterality Date   CARPAL TUNNEL RELEASE Right 11/26/2020    Procedure: CARPAL TUNNEL RELEASE;  Surgeon: Kennedy Bucker, MD;  Location: [facility];  Service: Orthopedics;  Laterality: Right;   CATARACT EXTRACTION W/PHACO Right 01/01/2023   Procedure: CATARACT EXTRACTION PHACO AND INTRAOCULAR LENS PLACEMENT (IOC) RIGHT DIABETIC;  Surgeon: Nevada Crane, MD;  Location: [facility];  Service: Ophthalmology;  Laterality: Right;  10.12 053.5   CATARACT EXTRACTION W/PHACO Left 01/15/2023   Procedure: CATARACT EXTRACTION PHACO AND INTRAOCULAR LENS PLACEMENT (IOC) LEFT DIABETIC 8.99 00:49.5;  Surgeon: Nevada Crane, MD;  Location: [facility];  Service: Ophthalmology;  Laterality: Left;  Diabetic   COLONOSCOPY WITH PROPOFOL N/A 10/21/2020   Procedure: COLONOSCOPY WITH PROPOFOL;  Surgeon: Pasty Spillers, MD;  Location: [facility];  Service: Endoscopy;  Laterality: N/A;   COLONOSCOPY WITH PROPOFOL N/A 09/15/2021   Procedure: COLONOSCOPY WITH PROPOFOL;  Surgeon: Pasty Spillers, MD;  Location: [facility];  Service: Endoscopy;  Laterality: N/A;   COLONOSCOPY WITH PROPOFOL N/A 06/06/2023   Procedure: COLONOSCOPY WITH PROPOFOL;  Surgeon: Midge Minium, MD;  Location: [facility] END[facility];  Service: Endoscopy;  Laterality: N/A;   DILATION AND CURETTAGE OF UTERUS  12/11/2009   EYE SURGERY     gastric bypass     OPEN REDUCTION INTERNAL FIXATION (ORIF) DISTAL RADIAL FRACTURE Right 11/26/2020   Procedure: Open reduction internal fixation, right distal radius;  Surgeon: Kennedy Bucker, MD;  Location: [facility];  Service: Orthopedics;  Laterality: Right;   OVARIAN CYST REMOVAL     POLYPECTOMY  06/06/2023   Procedure: POLYPECTOMY;  Surgeon: Midge Minium, MD;  Location: [facility] END[facility];  Service: Endoscopy;;   ROTATOR CUFF REPAIR     right   SHOULDER ARTHROSCOPY WITH SUBACROMIAL DECOMPRESSION AND OPEN ROTATOR C Right 07/19/2020   Procedure: Right shoulder arthroscopic vs mini-open rotator cuff repair vs Regeneten patch application, subacomial  dcompression, and biceps tenodesis;  Surgeon: Signa Kell, MD;  Location: [facility];  Service: Orthopedics;  Laterality: Right;     Home Medications   Prior to Admission medications   Medication Sig Start Date End Date Taking? Authorizing Provider  albuterol (VENTOLIN HFA) 108 (90 Base) MCG/ACT inhaler Inhale 2 puffs into the lungs every 6 (six) hours as needed for wheezing or shortness of breath. 10/02/19   Remus Loffler, PA-C  amitriptyline (ELAVIL) 25 MG tablet TAKE 1 TABLET BY MOUTH EVERYDAY AT BEDTIME 07/10/23   Sherlene Shams, MD  amitriptyline (ELAVIL) 50 MG tablet TAKE 1 TABLET BY MOUTH EVERYDAY AT BEDTIME 09/10/23   Sherlene Shams, MD  aspirin-acetaminophen-caffeine (EXCEDRIN MIGRAINE) [phone] MG tablet Take 2 tablets by mouth every 6 (six) hours as needed for headache.    [provider]  atorvastatin (LIPITOR) 20 MG tablet Take 1 tablet (20 mg total) by mouth daily. 10/15/23   Sherlene Shams, MD  Azelastine HCl 137 MCG/SPRAY SOLN  11/22/21   [provider]  buPROPion (WELLBUTRIN XL) 150 MG 24 hr tablet Take 450 mg by mouth daily.  05/12/18   [provider]  CLINPRO 5000 1.1 % PSTE Place 1 application onto teeth at bedtime.  06/15/20   [provider]  Continuous Glucose Sensor (FREESTYLE LIBRE 3 SENSOR) MISC Place 1 sensor on the skin every 14 days. Use to check glucose continuously 05/16/23   Sherlene Shams, MD  cyanocobalamin (VITAMIN B12) 1000 MCG/ML injection Inject 1 mL (1,000 mcg total) into the muscle every 30 (thirty) days. Inject 1 mL intramuscularly once a week for 4 weeks and then once a month thereafter. 10/15/23   Sherlene Shams, MD  cyclobenzaprine (FLEXERIL) 10 MG tablet Take 0.5-1 tablets (5-10 mg total) by mouth 3 (three) times daily as needed for muscle spasms. 06/05/23   Sherlene Shams, MD  cyclobenzaprine (FLEXERIL) 5 MG tablet Take 1 tablet (5 mg total) by mouth 3 (three) times daily as needed. 03/21/23   Menshew, Charlesetta Ivory,  PA-C  diphenhydrAMINE (BENADRYL) 25 MG tablet Take 25 mg by mouth every 6 (six) hours as needed for allergies.    [provider]  EPINEPHrine 0.3 mg/0.3 mL IJ SOAJ injection Inject 0.3 mg into the muscle as needed for anaphylaxis.  06/25/10   [provider]  escitalopram (LEXAPRO) 20 MG tablet Take 20 mg by mouth daily.  02/27/17   [provider]  fluticasone (FLONASE) 50 MCG/ACT nasal spray SPRAY 2 SPRAYS INTO EACH NOSTRIL EVERY DAY 04/21/21   Sherlene Shams, MD  furosemide (LASIX) 20 MG tablet TAKE 1 TABLET (20 MG TOTAL) BY MOUTH DAILY. AS NEEDED FOR FLUID RETENTION 02/01/21   Sherlene Shams, MD  HORIZANT 600 MG TBCR TAKE 1 TABLET BY MOUTH TWICE A DAY 07/24/23   Sherlene Shams, MD  insulin aspart (NOVOLOG FLEXPEN) 100 UNIT/ML FlexPen Inject 20 Units into the skin 3 (three) times daily with meals. INJECT 20 UNITS INTO THE SKIN 3 (THREE) TIMES DAILY WITH MEALS. 10/09/23   Sherlene Shams, MD  insulin glargine (LANTUS SOLOSTAR) 100 UNIT/ML Solostar Pen Inject 45 Units into the skin daily. 05/16/23   Sherlene Shams, MD  Insulin Pen Needle (BD PEN NEEDLE NANO U/F) 32G X 4 MM MISC Please specify directions, refills and quantity 05/31/22   Sherlene Shams, MD  lamoTRIgine (LAMICTAL) 100 MG tablet Take 100 mg by mouth 2 (two) times daily. 09/12/23   [provider]  levothyroxine (SYNTHROID) 50 MCG tablet Take 1 tablet (50 mcg total) by mouth daily. 10/15/23   Sherlene Shams, MD  lipase/protease/amylase (CREON) 36000 UNITS CPEP capsule TAKE 2 CAPSULES BY MOUTH 3 (THREE) TIMES DAILY BEFORE MEALS AND 1 CAPSULE WITH SNACKS. 05/14/23   Celso Amy, PA-C  LORazepam (ATIVAN) 0.5 MG tablet Take 1 tablet (0.5 mg total) by mouth daily as needed for anxiety. 05/16/23   Sherlene Shams, MD  losartan (COZAAR) 25 MG tablet TAKE 1 TABLET (25 MG TOTAL) BY MOUTH DAILY. 09/05/23 09/04/24  Sherlene Shams, MD  meclizine (ANTIVERT) 25 MG tablet Take 1 tablet (25 mg total) by mouth 3 (three) times  daily as needed for dizziness. 06/16/15   Sherlene Shams, MD  Melatonin 10 MG TABS Take 10 mg by mouth at bedtime as needed (sleep).    [provider]  metoprolol tartrate (LOPRESSOR) 25 MG tablet TAKE 0.5 TABLETS BY MOUTH 2 TIMES DAILY. 10/15/23   Sherlene Shams, MD  NURTEC 75 MG TBDP Take 1 tablet by mouth daily as needed. 11/25/21   [provider]  nystatin (MYCOSTATIN/NYSTOP)  powder APPLY 1 APPLICATION TO AFFECTED AREA TWICE A DAY 12/23/20   Glori Luis, MD  OnabotulinumtoxinA (BOTOX IJ) Inject as directed every 3 (three) months. For migraine    [provider]  ondansetron (ZOFRAN ODT) 4 MG disintegrating tablet Take 1 tablet (4 mg total) by mouth every 8 (eight) hours as needed for nausea or vomiting. 10/03/21   Chesley Noon, MD  polyethylene glycol powder (GLYCOLAX/MIRALAX) 17 GM/SCOOP powder Mix 1 capful in a drink once daily for Constipation. 05/14/23   Celso Amy, PA-C  SYRINGE-NEEDLE, DISP, 3 ML (BD INTEGRA SYRINGE) 25G X 1" 3 ML MISC FOR USE WITH B12 INJECTIONS WEEKLY/MONTHLY 05/25/23   Sherlene Shams, MD     Allergies  Amoxicillin, Avocado, Citrullus vulgaris, Clarithromycin, Clarithromycin, Honey, Other, Sulfa antibiotics, Sulfa drugs cross reactors, Watermelon concentrate, Charentais melon (french melon), Insulin detemir, Silicone, Tape, and Victoza [liraglutide]   Family History   Family History  Problem Relation Age of Onset   Diabetes Mother    Hypertension Mother    Hypertension Father    Diabetes Father    Depression Other        strong hx of   Breast cancer Paternal Grandmother 76   Breast cancer Maternal Grandmother    Cancer Neg Hx      Physical Exam  Triage Vital Signs: ED Triage Vitals  Encounter Vitals Group     BP 10/15/23 1647 (!) 157/95     Systolic BP Percentile --      Diastolic BP Percentile --      Pulse Rate 10/15/23 1647 (!) 127     Resp 10/15/23 1647 20     Temp 10/15/23 1647 98.4 F (36.9 C)     Temp  Source 10/15/23 1647 Oral     SpO2 10/15/23 1647 96 %     Weight 10/15/23 1646 232 lb (105.2 kg)     Height 10/15/23 1646 5\' 4"  (1.626 m)     Head Circumference --      Peak Flow --      Pain Score 10/15/23 1646 0     Pain Loc --      Pain Education --      Exclude from Growth Chart --     Updated Vital Signs: BP (!) 142/76   Pulse (!) 107   Temp 98.5 F (36.9 C) (Oral)   Resp 16   Ht 5\' 4"  (1.626 m)   Wt 105.2 kg   LMP 05/09/2016 (LMP Unknown) Comment: D/C  SpO2 94%   BMI 39.82 kg/m   {Only need to document appropriate and relevant physical exam:1} General: Awake, no distress. *** CV:  Good peripheral perfusion. *** Resp:  Normal effort. *** Abd:  No distention. *** Other:  ***   ED Results / Procedures / Treatments  Labs (all labs ordered are listed, but only abnormal results are displayed) Labs Reviewed  BASIC METABOLIC PANEL - Abnormal; Notable for the following components:      Result Value   Creatinine, Ser 1.26 (*)    GFR, Estimated 51 (*)    All other components within normal limits  CBC - Abnormal; Notable for the following components:   WBC 11.1 (*)    RBC 3.75 (*)    Hemoglobin 7.1 (*)    HCT 28.8 (*)    MCV 76.8 (*)    MCH 18.9 (*)    MCHC 24.7 (*)    RDW 21.5 (*)    Platelets 535 (*)  nRBC 0.3 (*)    All other components within normal limits  TYPE AND SCREEN     EKG  ***   RADIOLOGY *** {You MUST document your own interpretation of imaging, as well as the fact that you reviewed the radiologist's report!:1}  Official radiology report(s): No results found.   PROCEDURES:  Critical Care performed: {CriticalCareYesNo:19197::"Yes, see critical care procedure note(s)","No"}  Procedures   MEDICATIONS ORDERED IN ED: Medications - No data to display   IMPRESSION / MDM / ASSESSMENT AND PLAN / ED COURSE  I reviewed the triage vital signs and the nursing notes.                              Differential diagnosis includes, but is  not limited to, ***  Patient's presentation is most consistent with {EM COPA:27473}  {If the patient is on the monitor, remove the brackets and asterisks on the sentence below and remember to document it as a Procedure as well. Otherwise delete the sentence below:1} {**The patient is on the cardiac monitor to evaluate for evidence of arrhythmia and/or significant heart rate changes.**}  {Remember to include, when applicable, any/all of the following data: independent review of imaging independent review of labs (comment specifically on pertinent positives and negatives) review of specific prior hospitalizations, PCP/specialist notes, etc. discuss meds given and prescribed document any discussion with consultants (including hospitalists) any clinical decision tools you used and why (PECARN, NEXUS, etc.) did you consider admitting the patient? document social determinants of health affecting patient's care (homelessness, inability to follow up in a timely fashion, etc) document any pre-existing conditions increasing risk on current visit (e.g. diabetes and HTN increasing danger of high-risk chest pain/ACS) describes what meds you gave (especially parenteral) and why any other interventions?:1}      FINAL CLINICAL IMPRESSION(S) / ED DIAGNOSES   Final diagnoses:  None     Rx / DC Orders   ED Discharge Orders     None        Note:  This document was prepared using Dragon voice recognition software and may include unintentional dictation errors.

## 2023-10-15 NOTE — H&P (Incomplete)
[place]   PATIENT NAME: Maria Bentley    MR#:  [id number]  DATE OF BIRTH:  1971/09/12  DATE OF ADMISSION:  10/15/2023  PRIMARY CARE PHYSICIAN: Sherlene Shams, MD   Patient is coming from: Home  REQUESTING/REFERRING PHYSICIAN: Chiquita Loth, MD  CHIEF COMPLAINT:   Chief Complaint  Patient presents with   Abnormal Lab    HISTORY OF PRESENT ILLNESS:  Maria Bentley is a 52 y.o. female with medical history significant for anxiety, osteoarthritis, stage III CKD, type diabetes mellitus, fibromyalgia, hypertension and hypothyroidism, presented to the emergency room with acute onset of abnormal blood work with anemia with hemoglobin 7.1 that was done on an outpatient lab yesterday and she was called for it at 4 PM.  She admits to recent generalized fatigue and tightness as well as dyspnea worsening on exertion.  No chest pain or palpitations or cough or wheezing or hemoptysis.  No nausea or vomiting or abdominal pain or heartburn.  No fever or chills.  No dysuria, oliguria or hematuria or flank pain.  No other bleeding diathesis.  She admitted to taking 2 Excedrin Migraine every day.  ED Course: When she came to the ER, BP was 142/68 and later 157/95 with heart rate of 130 and later 127 and otherwise normal vital signs.  Labs revealed creatinine 1.26.  Hemoglobin was 7.1 hematocrit 27.1 earlier and this afternoon 7.1 and 28.8 with microcytosis and thrombocytosis as well as mild leukocytosis of 11.1.  Blood glucose was 1 7687. EKG as reviewed by me : It showed sinus tachycardia with rate 122 with left anterior fascicular block and LVH with repolarization abnormality and mild ST segment elevation inferiorly with ST segment depression laterally. Imaging: For which x-ray showed no acute cardiopulmonary disease.  The patient was given 40 mg of IV Protonix and 1 L bolus of IV normal saline.  She will be admitted to a progressive unit bed for further evaluation and management.   PAST  MEDICAL HISTORY:   Past Medical History:  Diagnosis Date   Abscess of right buttock 04/30/2019   Anemia    Anxiety    Arthritis    Cataract    CKD (chronic kidney disease), stage III (HCC)    COVID-19 virus infection    Depression 2010   Diabetes mellitus    DKA, type 2 (HCC) 10/13/2019   Dysrhythmia    Endometriosis    Family history of adverse reaction to anesthesia    Mother - heart stopped during several procedures using propofol   Fibromyalgia    Headache, common migraine, intractable    History of kidney stones    History of ovarian cyst    left sided, caused by her endometriosis (possibly an endometrioma), in her 68s.   Hyperlipidemia    Hypertension    Hypothyroidism    Kidney stones    left   Morbid obesity (HCC)    s/p gastric bypass   Motion sickness    long car rides   Personal hx of gastric bypass 2010   Rosacea    Vertigo     PAST SURGICAL HISTORY:   Past Surgical History:  Procedure Laterality Date   CARPAL TUNNEL RELEASE Right 11/26/2020   Procedure: CARPAL TUNNEL RELEASE;  Surgeon: Kennedy Bucker, MD;  Location: [facility];  Service: Orthopedics;  Laterality: Right;   CATARACT EXTRACTION W/PHACO Right 01/01/2023   Procedure: CATARACT EXTRACTION PHACO AND INTRAOCULAR LENS PLACEMENT (IOC) RIGHT DIABETIC;  Surgeon: Willey Blade  Loraine Leriche, MD;  Location: [facility];  Service: Ophthalmology;  Laterality: Right;  10.12 053.5   CATARACT EXTRACTION W/PHACO Left 01/15/2023   Procedure: CATARACT EXTRACTION PHACO AND INTRAOCULAR LENS PLACEMENT (IOC) LEFT DIABETIC 8.99 00:49.5;  Surgeon: Nevada Crane, MD;  Location: [facility];  Service: Ophthalmology;  Laterality: Left;  Diabetic   COLONOSCOPY WITH PROPOFOL N/A 10/21/2020   Procedure: COLONOSCOPY WITH PROPOFOL;  Surgeon: Pasty Spillers, MD;  Location: [facility];  Service: Endoscopy;  Laterality: N/A;   COLONOSCOPY WITH PROPOFOL N/A 09/15/2021   Procedure: COLONOSCOPY WITH PROPOFOL;   Surgeon: Pasty Spillers, MD;  Location: [facility];  Service: Endoscopy;  Laterality: N/A;   COLONOSCOPY WITH PROPOFOL N/A 06/06/2023   Procedure: COLONOSCOPY WITH PROPOFOL;  Surgeon: Midge Minium, MD;  Location: [facility];  Service: Endoscopy;  Laterality: N/A;   DILATION AND CURETTAGE OF UTERUS  12/11/2009   EYE SURGERY     gastric bypass     OPEN REDUCTION INTERNAL FIXATION (ORIF) DISTAL RADIAL FRACTURE Right 11/26/2020   Procedure: Open reduction internal fixation, right distal radius;  Surgeon: Kennedy Bucker, MD;  Location: [facility];  Service: Orthopedics;  Laterality: Right;   OVARIAN CYST REMOVAL     POLYPECTOMY  06/06/2023   Procedure: POLYPECTOMY;  Surgeon: Midge Minium, MD;  Location: [facility] END[facility];  Service: Endoscopy;;   ROTATOR CUFF REPAIR     right   SHOULDER ARTHROSCOPY WITH SUBACROMIAL DECOMPRESSION AND OPEN ROTATOR C Right 07/19/2020   Procedure: Right shoulder arthroscopic vs mini-open rotator cuff repair vs Regeneten patch application, subacomial dcompression, and biceps tenodesis;  Surgeon: Signa Kell, MD;  Location: [facility];  Service: Orthopedics;  Laterality: Right;    SOCIAL HISTORY:   Social History   Tobacco Use   Smoking status: Former    Current packs/day: 0.00    Average packs/day: 1 pack/day for 4.0 years (4.0 ttl pk-yrs)    Types: Cigarettes    Start date: 02/05/1992    Quit date: 02/05/1996    Years since quitting: 27.7   Smokeless tobacco: Never  Substance Use Topics   Alcohol use: No    FAMILY HISTORY:   Family History  Problem Relation Age of Onset   Diabetes Mother    Hypertension Mother    Hypertension Father    Diabetes Father    Depression Other        strong hx of   Breast cancer Paternal Grandmother 63   Breast cancer Maternal Grandmother    Cancer Neg Hx     DRUG ALLERGIES:   Allergies  Allergen Reactions   Amoxicillin Hives, Rash, Itching and Swelling   Avocado Swelling   Citrullus Vulgaris Hives and  Swelling    Oral swelling, ears swell shut   Clarithromycin Hives and Rash   Clarithromycin Anaphylaxis, Hives and Rash   Honey Anaphylaxis   Other Hives and Swelling    All melon   Sulfa Antibiotics Anaphylaxis   Sulfa Drugs Cross Reactors Anaphylaxis   Watermelon Concentrate Hives and Swelling    Oral swelling, ears swell shut   Charentais Melon (French Melon)     Other reaction(s): Other (See Comments)   Insulin Detemir Rash    Red/swollen injection site reactions with residual lumps   Silicone Itching and Rash    Skin breakdown   Tape Itching and Rash    Skin breakdown   Victoza [Liraglutide] Nausea Only    REVIEW OF SYSTEMS:   ROS As per history of present illness. All  pertinent systems were reviewed above. Constitutional, HEENT, cardiovascular, respiratory, GI, GU, musculoskeletal, neuro, psychiatric, endocrine, integumentary and hematologic systems were reviewed and are otherwise negative/unremarkable except for positive findings mentioned above in the HPI.   MEDICATIONS AT HOME:   Prior to Admission medications   Medication Sig Start Date End Date Taking? Authorizing Provider  albuterol (VENTOLIN HFA) 108 (90 Base) MCG/ACT inhaler Inhale 2 puffs into the lungs every 6 (six) hours as needed for wheezing or shortness of breath. 10/02/19   Remus Loffler, PA-C  amitriptyline (ELAVIL) 25 MG tablet TAKE 1 TABLET BY MOUTH EVERYDAY AT BEDTIME 07/10/23   Sherlene Shams, MD  amitriptyline (ELAVIL) 50 MG tablet TAKE 1 TABLET BY MOUTH EVERYDAY AT BEDTIME 09/10/23   Sherlene Shams, MD  aspirin-acetaminophen-caffeine (EXCEDRIN MIGRAINE) [phone] MG tablet Take 2 tablets by mouth every 6 (six) hours as needed for headache.    [provider]  atorvastatin (LIPITOR) 20 MG tablet Take 1 tablet (20 mg total) by mouth daily. 10/15/23   Sherlene Shams, MD  Azelastine HCl 137 MCG/SPRAY SOLN  11/22/21   [provider]  buPROPion (WELLBUTRIN XL) 150 MG 24 hr tablet Take  450 mg by mouth daily.  05/12/18   [provider]  CLINPRO 5000 1.1 % PSTE Place 1 application onto teeth at bedtime.  06/15/20   [provider]  Continuous Glucose Sensor (FREESTYLE LIBRE 3 SENSOR) MISC Place 1 sensor on the skin every 14 days. Use to check glucose continuously 05/16/23   Sherlene Shams, MD  cyanocobalamin (VITAMIN B12) 1000 MCG/ML injection Inject 1 mL (1,000 mcg total) into the muscle every 30 (thirty) days. Inject 1 mL intramuscularly once a week for 4 weeks and then once a month thereafter. 10/15/23   Sherlene Shams, MD  cyclobenzaprine (FLEXERIL) 10 MG tablet Take 0.5-1 tablets (5-10 mg total) by mouth 3 (three) times daily as needed for muscle spasms. 06/05/23   Sherlene Shams, MD  cyclobenzaprine (FLEXERIL) 5 MG tablet Take 1 tablet (5 mg total) by mouth 3 (three) times daily as needed. 03/21/23   Menshew, Charlesetta Ivory, PA-C  diphenhydrAMINE (BENADRYL) 25 MG tablet Take 25 mg by mouth every 6 (six) hours as needed for allergies.    [provider]  EPINEPHrine 0.3 mg/0.3 mL IJ SOAJ injection Inject 0.3 mg into the muscle as needed for anaphylaxis.  06/25/10   [provider]  escitalopram (LEXAPRO) 20 MG tablet Take 20 mg by mouth daily.  02/27/17   [provider]  fluticasone (FLONASE) 50 MCG/ACT nasal spray SPRAY 2 SPRAYS INTO EACH NOSTRIL EVERY DAY 04/21/21   Sherlene Shams, MD  furosemide (LASIX) 20 MG tablet TAKE 1 TABLET (20 MG TOTAL) BY MOUTH DAILY. AS NEEDED FOR FLUID RETENTION 02/01/21   Sherlene Shams, MD  HORIZANT 600 MG TBCR TAKE 1 TABLET BY MOUTH TWICE A DAY 07/24/23   Sherlene Shams, MD  insulin aspart (NOVOLOG FLEXPEN) 100 UNIT/ML FlexPen Inject 20 Units into the skin 3 (three) times daily with meals. INJECT 20 UNITS INTO THE SKIN 3 (THREE) TIMES DAILY WITH MEALS. 10/09/23   Sherlene Shams, MD  insulin glargine (LANTUS SOLOSTAR) 100 UNIT/ML Solostar Pen Inject 45 Units into the skin daily. 05/16/23   Sherlene Shams, MD   Insulin Pen Needle (BD PEN NEEDLE NANO U/F) 32G X 4 MM MISC Please specify directions, refills and quantity 05/31/22   Sherlene Shams, MD  lamoTRIgine (LAMICTAL) 100 MG  tablet Take 100 mg by mouth 2 (two) times daily. 09/12/23   [provider]  levothyroxine (SYNTHROID) 50 MCG tablet Take 1 tablet (50 mcg total) by mouth daily. 10/15/23   Sherlene Shams, MD  lipase/protease/amylase (CREON) 36000 UNITS CPEP capsule TAKE 2 CAPSULES BY MOUTH 3 (THREE) TIMES DAILY BEFORE MEALS AND 1 CAPSULE WITH SNACKS. 05/14/23   Celso Amy, PA-C  LORazepam (ATIVAN) 0.5 MG tablet Take 1 tablet (0.5 mg total) by mouth daily as needed for anxiety. 05/16/23   Sherlene Shams, MD  losartan (COZAAR) 25 MG tablet TAKE 1 TABLET (25 MG TOTAL) BY MOUTH DAILY. 09/05/23 09/04/24  Sherlene Shams, MD  meclizine (ANTIVERT) 25 MG tablet Take 1 tablet (25 mg total) by mouth 3 (three) times daily as needed for dizziness. 06/16/15   Sherlene Shams, MD  Melatonin 10 MG TABS Take 10 mg by mouth at bedtime as needed (sleep).    [provider]  metoprolol tartrate (LOPRESSOR) 25 MG tablet TAKE 0.5 TABLETS BY MOUTH 2 TIMES DAILY. 10/15/23   Sherlene Shams, MD  NURTEC 75 MG TBDP Take 1 tablet by mouth daily as needed. 11/25/21   [provider]  nystatin (MYCOSTATIN/NYSTOP) powder APPLY 1 APPLICATION TO AFFECTED AREA TWICE A DAY 12/23/20   Glori Luis, MD  OnabotulinumtoxinA (BOTOX IJ) Inject as directed every 3 (three) months. For migraine    [provider]  ondansetron (ZOFRAN ODT) 4 MG disintegrating tablet Take 1 tablet (4 mg total) by mouth every 8 (eight) hours as needed for nausea or vomiting. 10/03/21   Chesley Noon, MD  polyethylene glycol powder (GLYCOLAX/MIRALAX) 17 GM/SCOOP powder Mix 1 capful in a drink once daily for Constipation. 05/14/23   Celso Amy, PA-C  SYRINGE-NEEDLE, DISP, 3 ML (BD INTEGRA SYRINGE) 25G X 1" 3 ML MISC FOR USE WITH B12 INJECTIONS WEEKLY/MONTHLY 05/25/23   Sherlene Shams, MD      VITAL SIGNS:  Blood pressure (!) 143/70, pulse 99, temperature 98 F (36.7 C), temperature source Oral, resp. rate 14, height 5\' 4"  (1.626 m), weight 105.2 kg, last menstrual period 05/09/2016, SpO2 96%.  PHYSICAL EXAMINATION:  Physical Exam  GENERAL:  52 y.o.-year-old patient lying in the bed with no acute distress.  EYES: Pupils equal, round, reactive to light and accommodation. No scleral icterus. Extraocular muscles intact.  HEENT: Head atraumatic, normocephalic. Oropharynx and nasopharynx clear.  NECK:  Supple, no jugular venous distention. No thyroid enlargement, no tenderness.  LUNGS: Normal breath sounds bilaterally, no wheezing, rales,rhonchi or crepitation. No use of accessory muscles of respiration.  CARDIOVASCULAR: Regular rate and rhythm, S1, S2 normal. No murmurs, rubs, or gallops.  ABDOMEN: Soft, nondistended, nontender. Bowel sounds present. No organomegaly or mass.  EXTREMITIES: No pedal edema, cyanosis, or clubbing.  NEUROLOGIC: Cranial nerves II through XII are intact. Muscle strength 5/5 in all extremities. Sensation intact. Gait not checked.  PSYCHIATRIC: The patient is alert and oriented x 3.  Normal affect and good eye contact. SKIN: No obvious rash, lesion, or ulcer.   LABORATORY PANEL:   CBC Recent Labs  Lab 10/15/23 1649  WBC 11.1*  HGB 7.1*  HCT 28.8*  PLT 535*   ------------------------------------------------------------------------------------------------------------------  Chemistries  Recent Labs  Lab 10/15/23 1204 10/15/23 1649  NA 138 139  K 4.5 4.5  CL 102 104  CO2 27 25  GLUCOSE 176* 87  BUN 15 18  CREATININE 1.13 1.26*  CALCIUM 9.4 9.0  AST 15  --  ALT 15  --   ALKPHOS 87  --   BILITOT 0.3  --    ------------------------------------------------------------------------------------------------------------------  Cardiac Enzymes No results for input(s): "TROPONINI" in the last 168  hours. ------------------------------------------------------------------------------------------------------------------  RADIOLOGY:  DG Chest Port 1 View  Result Date: 10/15/2023 CLINICAL DATA:  Shortness of breath EXAM: PORTABLE CHEST 1 VIEW COMPARISON:  07/22/2020 FINDINGS: Lungs are clear.  No pleural effusion or pneumothorax. Eventration of the right hemidiaphragm, chronic. The heart is normal in size. IMPRESSION: No acute cardiopulmonary disease. Electronically Signed   By: Charline Bills M.D.   On: 10/15/2023 23:56      IMPRESSION AND PLAN:  Assessment and Plan: * GI bleeding - The patient be admitted to a progressive unit bed. - We will follow serial hemoglobins and hematocrits. - This is likely secondary to aspirin.  I counseled her to stop Excedrin Migraine. - The patient was typed and crossmatched and transfused 1 unit of packed red blood cells. - Will follow posttransfusion H&H. - We will continue the patient on IV PPI therapy. - GI consult will be obtained. - Dr. Mia Creek was notified about the patient.  Hypothyroidism - We will continue Synthroid.  Essential hypertension - We will continue antihypertensive therapy.  Type 2 diabetes mellitus without complication, with long-term current use of insulin (HCC) - The patient will be placed on supplement coverage with NovoLog. - We will continue basal coverage.  Dyslipidemia - Continue statin therapy.       DVT prophylaxis: SCDs Advanced Care Planning:  Code Status: full code. Family Communication:  The plan of care was discussed in details with the patient (and family). I answered all questions. The patient agreed to proceed with the above mentioned plan. Further management will depend upon hospital course. Disposition Plan: Back to previous home environment Consults called: GI. All the records are reviewed and case discussed with ED provider.  Status is: Inpatient  At the time of the admission, it appears  that the appropriate admission status for this patient is inpatient.  This is judged to be reasonable and necessary in order to provide the required intensity of service to ensure the patient's safety given the presenting symptoms, physical exam findings and initial radiographic and laboratory data in the context of comorbid conditions.  The patient requires inpatient status due to high intensity of service, high risk of further deterioration and high frequency of surveillance required.  I certify that at the time of admission, it is my clinical judgment that the patient will require inpatient hospital care extending more than 2 midnights.                            Dispo: The patient is from: Home              Anticipated d/c is to: Home              Patient currently is not medically stable to d/c.              Difficult to place patient: No  Hannah Beat M.D on 10/16/2023 at 5:56 AM  [facility]   From 7 PM-7 AM, contact night-coverage [email or web address]  CC: Primary care physician; Sherlene Shams, MD

## 2023-10-15 NOTE — ED Provider Notes (Signed)
[facility] Provider Note    Event Date/Time   First MD Initiated Contact with Patient 10/15/23 2322     (approximate)   History   Abnormal Lab   HPI  Maria Bentley is a 52 y.o. female referred to the ED from her PCP with a chief complaint of anemia.  Patient has had a 6-week history of feeling more short of breath than usual, dyspnea on exertion associated with chest pain.  Had routine blood work with her PCP and found to be anemic with hemoglobin 7.  Referred to the ED for further evaluation.  Denies recent fever/chills, chest pain, shortness of breath, abdominal pain, nausea, vomiting or dizziness.  Denies anticoagulant use.  Denies black or bloody stools.  Does states she takes 2 Excedrin every day for chronic migraine headaches.  Recent colonoscopy over the summer with single polypectomy.     Past Medical History   Past Medical History:  Diagnosis Date   Abscess of right buttock 04/30/2019   Anemia    Anxiety    Arthritis    Cataract    CKD (chronic kidney disease), stage III (HCC)    COVID-19 virus infection    Depression 2010   Diabetes mellitus    DKA, type 2 (HCC) 10/13/2019   Dysrhythmia    Endometriosis    Family history of adverse reaction to anesthesia    Mother - heart stopped during several procedures using propofol   Fibromyalgia    Headache, common migraine, intractable    History of kidney stones    History of ovarian cyst    left sided, caused by her endometriosis (possibly an endometrioma), in her 39s.   Hyperlipidemia    Hypertension    Hypothyroidism    Kidney stones    left   Morbid obesity (HCC)    s/p gastric bypass   Motion sickness    long car rides   Personal hx of gastric bypass 2010   Rosacea    Vertigo      Active Problem List   Patient Active Problem List   Diagnosis Date Noted   Polyp of sigmoid colon 06/06/2023   Colon cancer screening 06/06/2023   B12 deficiency 05/18/2023   Knee pain  12/23/2022   Other symptoms and signs involving cognitive functions and awareness 09/14/2022   Radiculitis, cervical 11/28/2021   COVID-19 long hauler manifesting chronic fatigue 11/28/2021   History of UTI 11/28/2021   Polyp of transverse colon    COVID-19 long hauler manifesting chronic concentration deficit 07/09/2021   Encounter for screening colonoscopy    Pancreatic insufficiency 09/04/2020   Diarrhea 09/04/2020   Hematuria 08/25/2020   Stage 3a chronic kidney disease (HCC) 08/25/2020   Analgesic overuse headache 08/24/2020   Atypical facial pain 08/24/2020   Bruxism (teeth grinding) 08/24/2020   Cervical syndrome 08/24/2020   Disorder of autonomic nervous system 08/24/2020   Fibromyositis 08/24/2020   Temporomandibular joint disorder 08/24/2020   Hypoglycemic reaction 08/21/2020   Coronary atherosclerosis due to calcified coronary lesion 08/21/2020   Renal mass, left 07/06/2020   Chronic right shoulder pain 02/09/2020   Recurrent headache 01/13/2020   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 12/30/2019   Elevated alkaline phosphatase level 11/21/2019   Hospital discharge follow-up 11/04/2019   Cervical spondylosis without myelopathy 10/29/2019   Pressure injury of skin 10/16/2019   History of gastric bypass 10/13/2019   COVID-19 virus infection 10/06/2019   Preoperative evaluation of a medical condition  to rule out surgical contraindications (TAR required) 09/21/2019   Morbid obesity with BMI of 40.0-44.9, adult (HCC) 06/24/2019   History of bariatric surgery 04/15/2019   Depressive disorder 11/12/2018   Fibromyalgia 06/05/2018   Iron deficiency anemia 06/05/2018   Hypertension 11/24/2017   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 disease (HCC) 05/15/2017   Cervico-occipital neuralgia 05/02/2017   Low back pain 02/02/2017   Hip pain, chronic 12/28/2015   Pain in joint, ankle and foot 12/28/2015   Insomnia due to psychological stress 09/09/2015    Nonadherence to medical treatment 06/08/2015   Right hip pain 05/30/2014   Vitamin D deficiency 05/30/2014   Tachycardia 08/15/2013   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/2013   Sinusitis, acute 03/20/2013   Morbid obesity (HCC) 06/09/2012   Anxiety    Headache, common migraine, intractable    Hyperlipidemia    Hypothyroid    OSA (obstructive sleep apnea) 02/05/2012     Past Surgical History   Past Surgical History:  Procedure Laterality Date   CARPAL TUNNEL RELEASE Right 11/26/2020   Procedure: CARPAL TUNNEL RELEASE;  Surgeon: Kennedy Bucker, MD;  Location: [facility];  Service: Orthopedics;  Laterality: Right;   CATARACT EXTRACTION W/PHACO Right 01/01/2023   Procedure: CATARACT EXTRACTION PHACO AND INTRAOCULAR LENS PLACEMENT (IOC) RIGHT DIABETIC;  Surgeon: Nevada Crane, MD;  Location: [facility];  Service: Ophthalmology;  Laterality: Right;  10.12 053.5   CATARACT EXTRACTION W/PHACO Left 01/15/2023   Procedure: CATARACT EXTRACTION PHACO AND INTRAOCULAR LENS PLACEMENT (IOC) LEFT DIABETIC 8.99 00:49.5;  Surgeon: Nevada Crane, MD;  Location: [facility];  Service: Ophthalmology;  Laterality: Left;  Diabetic   COLONOSCOPY WITH PROPOFOL N/A 10/21/2020   Procedure: COLONOSCOPY WITH PROPOFOL;  Surgeon: Pasty Spillers, MD;  Location: [facility];  Service: Endoscopy;  Laterality: N/A;   COLONOSCOPY WITH PROPOFOL N/A 09/15/2021   Procedure: COLONOSCOPY WITH PROPOFOL;  Surgeon: Pasty Spillers, MD;  Location: [facility];  Service: Endoscopy;  Laterality: N/A;   COLONOSCOPY WITH PROPOFOL N/A 06/06/2023   Procedure: COLONOSCOPY WITH PROPOFOL;  Surgeon: Midge Minium, MD;  Location: [facility];  Service: Endoscopy;  Laterality: N/A;   DILATION AND CURETTAGE OF UTERUS  12/11/2009   EYE SURGERY     gastric bypass     OPEN REDUCTION INTERNAL FIXATION (ORIF) DISTAL RADIAL FRACTURE Right 11/26/2020   Procedure: Open reduction internal  fixation, right distal radius;  Surgeon: Kennedy Bucker, MD;  Location: [facility];  Service: Orthopedics;  Laterality: Right;   OVARIAN CYST REMOVAL     POLYPECTOMY  06/06/2023   Procedure: POLYPECTOMY;  Surgeon: Midge Minium, MD;  Location: [facility];  Service: Endoscopy;;   ROTATOR CUFF REPAIR     right   SHOULDER ARTHROSCOPY WITH SUBACROMIAL DECOMPRESSION AND OPEN ROTATOR C Right 07/19/2020   Procedure: Right shoulder arthroscopic vs mini-open rotator cuff repair vs Regeneten patch application, subacomial dcompression, and biceps tenodesis;  Surgeon: Signa Kell, MD;  Location: [facility];  Service: Orthopedics;  Laterality: Right;     Home Medications   Prior to Admission medications   Medication Sig Start Date End Date Taking? Authorizing Provider  albuterol (VENTOLIN HFA) 108 (90 Base) MCG/ACT inhaler Inhale 2 puffs into the lungs every 6 (six) hours as needed for wheezing or shortness of breath. 10/02/19   Remus Loffler, PA-C  amitriptyline (ELAVIL) 25 MG tablet TAKE 1 TABLET BY MOUTH EVERYDAY AT BEDTIME 07/10/23   Sherlene Shams, MD  amitriptyline (ELAVIL) 50 MG tablet  TAKE 1 TABLET BY MOUTH EVERYDAY AT BEDTIME 09/10/23   Sherlene Shams, MD  aspirin-acetaminophen-caffeine (EXCEDRIN MIGRAINE) [phone] MG tablet Take 2 tablets by mouth every 6 (six) hours as needed for headache.    [provider]  atorvastatin (LIPITOR) 20 MG tablet Take 1 tablet (20 mg total) by mouth daily. 10/15/23   Sherlene Shams, MD  Azelastine HCl 137 MCG/SPRAY SOLN  11/22/21   [provider]  buPROPion (WELLBUTRIN XL) 150 MG 24 hr tablet Take 450 mg by mouth daily.  05/12/18   [provider]  CLINPRO 5000 1.1 % PSTE Place 1 application onto teeth at bedtime.  06/15/20   [provider]  Continuous Glucose Sensor (FREESTYLE LIBRE 3 SENSOR) MISC Place 1 sensor on the skin every 14 days. Use to check glucose continuously 05/16/23   Sherlene Shams, MD  cyanocobalamin (VITAMIN  B12) 1000 MCG/ML injection Inject 1 mL (1,000 mcg total) into the muscle every 30 (thirty) days. Inject 1 mL intramuscularly once a week for 4 weeks and then once a month thereafter. 10/15/23   Sherlene Shams, MD  cyclobenzaprine (FLEXERIL) 10 MG tablet Take 0.5-1 tablets (5-10 mg total) by mouth 3 (three) times daily as needed for muscle spasms. 06/05/23   Sherlene Shams, MD  cyclobenzaprine (FLEXERIL) 5 MG tablet Take 1 tablet (5 mg total) by mouth 3 (three) times daily as needed. 03/21/23   Menshew, Charlesetta Ivory, PA-C  diphenhydrAMINE (BENADRYL) 25 MG tablet Take 25 mg by mouth every 6 (six) hours as needed for allergies.    [provider]  EPINEPHrine 0.3 mg/0.3 mL IJ SOAJ injection Inject 0.3 mg into the muscle as needed for anaphylaxis.  06/25/10   [provider]  escitalopram (LEXAPRO) 20 MG tablet Take 20 mg by mouth daily.  02/27/17   [provider]  fluticasone (FLONASE) 50 MCG/ACT nasal spray SPRAY 2 SPRAYS INTO EACH NOSTRIL EVERY DAY 04/21/21   Sherlene Shams, MD  furosemide (LASIX) 20 MG tablet TAKE 1 TABLET (20 MG TOTAL) BY MOUTH DAILY. AS NEEDED FOR FLUID RETENTION 02/01/21   Sherlene Shams, MD  HORIZANT 600 MG TBCR TAKE 1 TABLET BY MOUTH TWICE A DAY 07/24/23   Sherlene Shams, MD  insulin aspart (NOVOLOG FLEXPEN) 100 UNIT/ML FlexPen Inject 20 Units into the skin 3 (three) times daily with meals. INJECT 20 UNITS INTO THE SKIN 3 (THREE) TIMES DAILY WITH MEALS. 10/09/23   Sherlene Shams, MD  insulin glargine (LANTUS SOLOSTAR) 100 UNIT/ML Solostar Pen Inject 45 Units into the skin daily. 05/16/23   Sherlene Shams, MD  Insulin Pen Needle (BD PEN NEEDLE NANO U/F) 32G X 4 MM MISC Please specify directions, refills and quantity 05/31/22   Sherlene Shams, MD  lamoTRIgine (LAMICTAL) 100 MG tablet Take 100 mg by mouth 2 (two) times daily. 09/12/23   [provider]  levothyroxine (SYNTHROID) 50 MCG tablet Take 1 tablet (50 mcg total) by mouth daily. 10/15/23    Sherlene Shams, MD  lipase/protease/amylase (CREON) 36000 UNITS CPEP capsule TAKE 2 CAPSULES BY MOUTH 3 (THREE) TIMES DAILY BEFORE MEALS AND 1 CAPSULE WITH SNACKS. 05/14/23   Celso Amy, PA-C  LORazepam (ATIVAN) 0.5 MG tablet Take 1 tablet (0.5 mg total) by mouth daily as needed for anxiety. 05/16/23   Sherlene Shams, MD  losartan (COZAAR) 25 MG tablet TAKE 1 TABLET (25 MG TOTAL) BY MOUTH DAILY. 09/05/23 09/04/24  Sherlene Shams, MD  meclizine (  ANTIVERT) 25 MG tablet Take 1 tablet (25 mg total) by mouth 3 (three) times daily as needed for dizziness. 06/16/15   Sherlene Shams, MD  Melatonin 10 MG TABS Take 10 mg by mouth at bedtime as needed (sleep).    [provider]  metoprolol tartrate (LOPRESSOR) 25 MG tablet TAKE 0.5 TABLETS BY MOUTH 2 TIMES DAILY. 10/15/23   Sherlene Shams, MD  NURTEC 75 MG TBDP Take 1 tablet by mouth daily as needed. 11/25/21   [provider]  nystatin (MYCOSTATIN/NYSTOP) powder APPLY 1 APPLICATION TO AFFECTED AREA TWICE A DAY 12/23/20   Glori Luis, MD  OnabotulinumtoxinA (BOTOX IJ) Inject as directed every 3 (three) months. For migraine    [provider]  ondansetron (ZOFRAN ODT) 4 MG disintegrating tablet Take 1 tablet (4 mg total) by mouth every 8 (eight) hours as needed for nausea or vomiting. 10/03/21   Chesley Noon, MD  polyethylene glycol powder (GLYCOLAX/MIRALAX) 17 GM/SCOOP powder Mix 1 capful in a drink once daily for Constipation. 05/14/23   Celso Amy, PA-C  SYRINGE-NEEDLE, DISP, 3 ML (BD INTEGRA SYRINGE) 25G X 1" 3 ML MISC FOR USE WITH B12 INJECTIONS WEEKLY/MONTHLY 05/25/23   Sherlene Shams, MD     Allergies  Amoxicillin, Avocado, Citrullus vulgaris, Clarithromycin, Clarithromycin, Honey, Other, Sulfa antibiotics, Sulfa drugs cross reactors, Watermelon concentrate, Charentais melon (french melon), Insulin detemir, Silicone, Tape, and Victoza [liraglutide]   Family History   Family History  Problem Relation Age of Onset    Diabetes Mother    Hypertension Mother    Hypertension Father    Diabetes Father    Depression Other        strong hx of   Breast cancer Paternal Grandmother 40   Breast cancer Maternal Grandmother    Cancer Neg Hx      Physical Exam  Triage Vital Signs: ED Triage Vitals  Encounter Vitals Group     BP 10/15/23 1647 (!) 157/95     Systolic BP Percentile --      Diastolic BP Percentile --      Pulse Rate 10/15/23 1647 (!) 127     Resp 10/15/23 1647 20     Temp 10/15/23 1647 98.4 F (36.9 C)     Temp Source 10/15/23 1647 Oral     SpO2 10/15/23 1647 96 %     Weight 10/15/23 1646 232 lb (105.2 kg)     Height 10/15/23 1646 5\' 4"  (1.626 m)     Head Circumference --      Peak Flow --      Pain Score 10/15/23 1646 0     Pain Loc --      Pain Education --      Exclude from Growth Chart --     Updated Vital Signs: BP (!) 142/76   Pulse (!) 107   Temp 98.5 F (36.9 C) (Oral)   Resp 16   Ht 5\' 4"  (1.626 m)   Wt 105.2 kg   LMP 05/09/2016 (LMP Unknown) Comment: D/C  SpO2 94%   BMI 39.82 kg/m    General: Awake, no distress.  CV:  Tachycardic.  Good peripheral perfusion.  Resp:  Normal effort.  CTAB. Abd:  Nontender.  No distention.  Other:  Pale.   ED Results / Procedures / Treatments  Labs (all labs ordered are listed, but only abnormal results are displayed) Labs Reviewed  BASIC METABOLIC PANEL - Abnormal; Notable for the following components:  Result Value   Creatinine, Ser 1.26 (*)    GFR, Estimated 51 (*)    All other components within normal limits  CBC - Abnormal; Notable for the following components:   WBC 11.1 (*)    RBC 3.75 (*)    Hemoglobin 7.1 (*)    HCT 28.8 (*)    MCV 76.8 (*)    MCH 18.9 (*)    MCHC 24.7 (*)    RDW 21.5 (*)    Platelets 535 (*)    nRBC 0.3 (*)    All other components within normal limits  TYPE AND SCREEN  PREPARE RBC (CROSSMATCH)     EKG  ED ECG REPORT I, Kjirsten Bloodgood J, the attending physician, personally  viewed and interpreted this ECG.   Date: 10/15/2023  EKG Time: 1821  Rate: 122  Rhythm: normal sinus rhythm  Axis: LAD  Intervals:left anterior fascicular block  ST&T Change: Nonspecific    RADIOLOGY I have independently visualized and interpreted patient's imaging study as well as noted the radiology interpretation:  {You MUST document your own interpretation of imaging, as well as the fact that you reviewed the radiologist's report!:1}  Official radiology report(s): No results found.   PROCEDURES:  Rectal exam: External exam normal without hemorrhoids or masses.  Brown stool on gloved finger which is heme positive.  Critical Care performed: Yes, see critical care procedure note(s)  CRITICAL CARE Performed by: Irean Hong   Total critical care time: 30 minutes  Critical care time was exclusive of separately billable procedures and treating other patients.  Critical care was necessary to treat or prevent imminent or life-threatening deterioration.  Critical care was time spent personally by me on the following activities: development of treatment plan with patient and/or surrogate as well as nursing, discussions with consultants, evaluation of patient's response to treatment, examination of patient, obtaining history from patient or surrogate, ordering and performing treatments and interventions, ordering and review of laboratory studies, ordering and review of radiographic studies, pulse oximetry and re-evaluation of patient's condition.   Marland Kitchen1-3 Lead EKG Interpretation  Performed by: Irean Hong, MD Authorized by: Irean Hong, MD     Interpretation: abnormal     ECG rate:  110   ECG rate assessment: tachycardic     Rhythm: sinus tachycardia     Ectopy: none     Conduction: normal   Comments:     Patient placed on cardiac monitor to evaluate for arrhythmias    MEDICATIONS ORDERED IN ED: Medications  sodium chloride flush (NS) 0.9 % injection 10 mL (has no  administration in time range)  pantoprazole (PROTONIX) 80 mg /NS 100 mL IVPB (has no administration in time range)  pantoprozole (PROTONIX) 80 mg /NS 100 mL infusion (has no administration in time range)     IMPRESSION / MDM / ASSESSMENT AND PLAN / ED COURSE  I reviewed the triage vital signs and the nursing notes.                             52 year old female sent from PCPs office for low hemoglobin. Differential diagnosis includes, but is not limited to, ovarian cyst, ovarian torsion, acute appendicitis, diverticulitis, urinary tract infection/pyelonephritis, endometriosis, bowel obstruction, colitis, renal colic, gastroenteritis, hernia, fibroids, etc. I have personally reviewed patient's records and note her PCP office visit from earlier today as well as her colonoscopy results from 06/06/2023.  Patient's presentation is most consistent with acute presentation  with potential threat to life or bodily function.  The patient is on the cardiac monitor to evaluate for evidence of arrhythmia and/or significant heart rate changes.  Laboratory results confirmatory for hemoglobin 7.1, mild AKI with creatinine 1.26.  Discussed risk/benefits of blood transfusion with patient who agrees with transfusion.  Will cover with Protonix bolus and drip.  Will consult hospitalist services for evaluation and admission.      FINAL CLINICAL IMPRESSION(S) / ED DIAGNOSES   Final diagnoses:  Symptomatic anemia  Gastrointestinal hemorrhage, unspecified gastrointestinal hemorrhage type  AKI (acute kidney injury) (HCC)     Rx / DC Orders   ED Discharge Orders     None        Note:  This document was prepared using Dragon voice recognition software and may include unintentional dictation errors.

## 2023-10-15 NOTE — ED Triage Notes (Signed)
Patient states she had routine blood work at PCP today, sent over for hgb 7.1. Reports feeling weak and having shortness of breath for 6 weeks.

## 2023-10-15 NOTE — Telephone Encounter (Signed)
CRITICAL VALUE STICKER  CRITICAL VALUE: Hemoglobin-7.1  RECEIVER (on-site recipient of call): Silvestre Moment, CMA  DATE & TIME NOTIFIED: 10/15/23 @ 3:25pm  MESSENGER (representative from lab): Clydie Braun  MD NOTIFIED: Dr. Darrick Huntsman  TIME OF NOTIFICATION: 3:27pm  RESPONSE:

## 2023-10-15 NOTE — Assessment & Plan Note (Signed)
Noted in June 2024,  IF AB negative. Managed with IM injections since diagnosis   Lab Results  Component Value Date   VITAMINB12 182 (L) 05/16/2023

## 2023-10-15 NOTE — Assessment & Plan Note (Signed)
Secondary to pancreatic insufficiency.

## 2023-10-15 NOTE — Patient Instructions (Addendum)
Great work on getting your diabetes under control!    1 recommend that you Limit the rapid acting insulin  dose to 25 units, and take  the dose 30 minutes before eating so that it's working as the sugar starts to rise   Use 20 units of Novolog if the meal is low carb (salad, veggies  and protein);  only  use 25 if it contains a starch serving (rice, potato, bread,  pasta)   Use 10 units of short acting at breakfast even if not eating breakfast  and just having a glucerna   Look at the 2 hour post prandial readings to modify future doses.

## 2023-10-15 NOTE — Assessment & Plan Note (Signed)
Improved control with use of CBG for frequent monitoring. I have downloaded and reviewed the data from patient's continuous blood glucose monitor. Patient's  sugars have been  IN RANGE   63  % OF THE TIME,   BELOW RANGE  1  % of the time.  And ABOVE RANGE 23  % OF THE TIME .  Medication changes were made based on this review as follows: reduced novolog mealtime dose to 25 units  maximum dose .  Continue 40 units of Tresiba daily and  return in one month

## 2023-10-16 ENCOUNTER — Encounter: Payer: Self-pay | Admitting: Family Medicine

## 2023-10-16 DIAGNOSIS — D649 Anemia, unspecified: Secondary | ICD-10-CM | POA: Diagnosis not present

## 2023-10-16 DIAGNOSIS — E785 Hyperlipidemia, unspecified: Secondary | ICD-10-CM | POA: Diagnosis present

## 2023-10-16 DIAGNOSIS — I10 Essential (primary) hypertension: Secondary | ICD-10-CM | POA: Diagnosis present

## 2023-10-16 DIAGNOSIS — E039 Hypothyroidism, unspecified: Secondary | ICD-10-CM | POA: Diagnosis present

## 2023-10-16 DIAGNOSIS — E119 Type 2 diabetes mellitus without complications: Secondary | ICD-10-CM

## 2023-10-16 LAB — HEMOGLOBIN A1C
Hgb A1c MFr Bld: 6.7 % — ABNORMAL HIGH (ref 4.8–5.6)
Mean Plasma Glucose: 145.59 mg/dL

## 2023-10-16 LAB — HEMOGLOBIN AND HEMATOCRIT, BLOOD
HCT: 32.6 % — ABNORMAL LOW (ref 36.0–46.0)
Hemoglobin: 9.2 g/dL — ABNORMAL LOW (ref 12.0–15.0)

## 2023-10-16 LAB — BASIC METABOLIC PANEL
Anion gap: 10 (ref 5–15)
BUN: 18 mg/dL (ref 6–20)
CO2: 23 mmol/L (ref 22–32)
Calcium: 8.3 mg/dL — ABNORMAL LOW (ref 8.9–10.3)
Chloride: 103 mmol/L (ref 98–111)
Creatinine, Ser: 1.05 mg/dL — ABNORMAL HIGH (ref 0.44–1.00)
GFR, Estimated: >60 mL/min (ref >60)
Glucose, Bld: 207 mg/dL — ABNORMAL HIGH (ref 70–99)
Potassium: 3.9 mmol/L (ref 3.5–5.1)
Sodium: 136 mmol/L (ref 135–145)

## 2023-10-16 LAB — FERRITIN: Ferritin: 6 ng/mL — ABNORMAL LOW (ref 11–307)

## 2023-10-16 LAB — CBC
HCT: 33.2 % — ABNORMAL LOW (ref 36.0–46.0)
Hemoglobin: 9.4 g/dL — ABNORMAL LOW (ref 12.0–15.0)
MCH: 22.1 pg — ABNORMAL LOW (ref 26.0–34.0)
MCHC: 28.3 g/dL — ABNORMAL LOW (ref 30.0–36.0)
MCV: 78.1 fL — ABNORMAL LOW (ref 80.0–100.0)
Platelets: 437 10*3/uL — ABNORMAL HIGH (ref 150–400)
RBC: 4.25 MIL/uL (ref 3.87–5.11)
RDW: 21.2 % — ABNORMAL HIGH (ref 11.5–15.5)
WBC: 8.8 10*3/uL (ref 4.0–10.5)
nRBC: 0.2 % (ref 0.0–0.2)

## 2023-10-16 LAB — IRON AND TIBC
Iron: 60 ug/dL (ref 28–170)
Saturation Ratios: 12 % (ref 10.4–31.8)
TIBC: 521 ug/dL — ABNORMAL HIGH (ref 250–450)
UIBC: 461 ug/dL

## 2023-10-16 LAB — CBG MONITORING, ED
Glucose-Capillary: 193 mg/dL — ABNORMAL HIGH (ref 70–99)
Glucose-Capillary: 266 mg/dL — ABNORMAL HIGH (ref 70–99)
Glucose-Capillary: 169 mg/dL — ABNORMAL HIGH (ref 70–99)
Glucose-Capillary: 216 mg/dL — ABNORMAL HIGH (ref 70–99)

## 2023-10-16 LAB — FOLATE: Folate: 21 ng/mL (ref >5.9)

## 2023-10-16 LAB — GLUCOSE, CAPILLARY: Glucose-Capillary: 218 mg/dL — ABNORMAL HIGH (ref 70–99)

## 2023-10-16 LAB — HIV ANTIBODY (ROUTINE TESTING W REFLEX): HIV Screen 4th Generation wRfx: NONREACTIVE

## 2023-10-16 MED ORDER — ACETAMINOPHEN 325 MG PO TABS
650.0000 mg | ORAL_TABLET | Freq: Four times a day (QID) | ORAL | Status: DC | PRN
  Administered 2023-10-16 – 2023-10-18 (×3): 650 mg via ORAL
  Filled 2023-10-16 (×3): qty 2

## 2023-10-16 MED ORDER — FUROSEMIDE 20 MG PO TABS
20.0000 mg | ORAL_TABLET | Freq: Every day | ORAL | Status: DC
  Administered 2023-10-16: 20 mg via ORAL
  Filled 2023-10-16 (×2): qty 1

## 2023-10-16 MED ORDER — METOPROLOL TARTRATE 25 MG PO TABS
25.0000 mg | ORAL_TABLET | Freq: Two times a day (BID) | ORAL | Status: DC
  Administered 2023-10-16 – 2023-10-18 (×5): 25 mg via ORAL
  Filled 2023-10-16 (×5): qty 1

## 2023-10-16 MED ORDER — LOSARTAN POTASSIUM 25 MG PO TABS
25.0000 mg | ORAL_TABLET | Freq: Every day | ORAL | Status: DC
  Administered 2023-10-16 – 2023-10-18 (×3): 25 mg via ORAL
  Filled 2023-10-16 (×3): qty 1

## 2023-10-16 MED ORDER — CYCLOBENZAPRINE HCL 10 MG PO TABS
5.0000 mg | ORAL_TABLET | Freq: Three times a day (TID) | ORAL | Status: DC | PRN
  Administered 2023-10-18: 5 mg via ORAL
  Filled 2023-10-16: qty 1

## 2023-10-16 MED ORDER — ACETAMINOPHEN 650 MG RE SUPP: 650.0000 mg | Freq: Four times a day (QID) | RECTAL | Status: DC | PRN

## 2023-10-16 MED ORDER — CYANOCOBALAMIN 1000 MCG/ML IJ SOLN
1000.0000 ug | INTRAMUSCULAR | Status: DC
Start: 2023-10-16 — End: 2023-10-18
  Administered 2023-10-16: 1000 ug via INTRAMUSCULAR
  Filled 2023-10-16: qty 1

## 2023-10-16 MED ORDER — PANCRELIPASE (LIP-PROT-AMYL) 36000-114000 UNITS PO CPEP
36000.0000 [IU] | ORAL_CAPSULE | Freq: Three times a day (TID) | ORAL | Status: DC
  Administered 2023-10-16: 36000 [IU] via ORAL
  Filled 2023-10-16 (×2): qty 1

## 2023-10-16 MED ORDER — LEVOTHYROXINE SODIUM 50 MCG PO TABS
50.0000 ug | ORAL_TABLET | Freq: Every day | ORAL | Status: DC
  Administered 2023-10-16 – 2023-10-18 (×3): 50 ug via ORAL
  Filled 2023-10-16 (×3): qty 1

## 2023-10-16 MED ORDER — PANCRELIPASE (LIP-PROT-AMYL) 12000-38000 UNITS PO CPEP: 36000.0000 [IU] | ORAL_CAPSULE | ORAL | Status: DC | PRN

## 2023-10-16 MED ORDER — SODIUM CHLORIDE 0.9 % IV SOLN: INTRAVENOUS | Status: AC

## 2023-10-16 MED ORDER — POLYETHYLENE GLYCOL 3350 17 G PO PACK
17.0000 g | PACK | Freq: Every day | ORAL | Status: DC
Start: 2023-10-16 — End: 2023-10-18
  Filled 2023-10-16 (×2): qty 1

## 2023-10-16 MED ORDER — MECLIZINE HCL 25 MG PO TABS: 12.5000 mg | ORAL_TABLET | Freq: Three times a day (TID) | ORAL | Status: DC | PRN

## 2023-10-16 MED ORDER — TRAZODONE HCL 50 MG PO TABS
25.0000 mg | ORAL_TABLET | Freq: Every evening | ORAL | Status: DC | PRN
  Administered 2023-10-18: 25 mg via ORAL
  Filled 2023-10-16: qty 1

## 2023-10-16 MED ORDER — ATORVASTATIN CALCIUM 20 MG PO TABS
20.0000 mg | ORAL_TABLET | Freq: Every day | ORAL | Status: DC
  Administered 2023-10-16 – 2023-10-18 (×3): 20 mg via ORAL
  Filled 2023-10-16 (×3): qty 1

## 2023-10-16 MED ORDER — INSULIN GLARGINE-YFGN 100 UNIT/ML ~~LOC~~ SOLN
45.0000 [IU] | Freq: Every day | SUBCUTANEOUS | Status: DC
  Administered 2023-10-16 – 2023-10-18 (×3): 45 [IU] via SUBCUTANEOUS
  Filled 2023-10-16 (×3): qty 0.45

## 2023-10-16 MED ORDER — MELATONIN 5 MG PO TABS: 10.0000 mg | ORAL_TABLET | Freq: Every evening | ORAL | Status: DC | PRN

## 2023-10-16 MED ORDER — EPINEPHRINE 0.3 MG/0.3ML IJ SOAJ: 0.3000 mg | INTRAMUSCULAR | Status: DC | PRN

## 2023-10-16 MED ORDER — ALBUTEROL SULFATE (2.5 MG/3ML) 0.083% IN NEBU: 2.5000 mg | INHALATION_SOLUTION | Freq: Four times a day (QID) | RESPIRATORY_TRACT | Status: DC | PRN

## 2023-10-16 MED ORDER — INSULIN ASPART 100 UNIT/ML IJ SOLN
0.0000 [IU] | INTRAMUSCULAR | Status: DC
  Administered 2023-10-16: 3 [IU] via SUBCUTANEOUS
  Administered 2023-10-16: 5 [IU] via SUBCUTANEOUS
  Administered 2023-10-16: 3 [IU] via SUBCUTANEOUS
  Administered 2023-10-16 (×2): 2 [IU] via SUBCUTANEOUS
  Administered 2023-10-17: 7 [IU] via SUBCUTANEOUS
  Administered 2023-10-17 (×2): 2 [IU] via SUBCUTANEOUS
  Administered 2023-10-18: 5 [IU] via SUBCUTANEOUS
  Administered 2023-10-18: 2 [IU] via SUBCUTANEOUS
  Filled 2023-10-16 (×10): qty 1

## 2023-10-16 MED ORDER — FLUTICASONE PROPIONATE 50 MCG/ACT NA SUSP
2.0000 | Freq: Every day | NASAL | Status: DC
  Administered 2023-10-16: 2 via NASAL
  Filled 2023-10-16: qty 16

## 2023-10-16 MED ORDER — DIPHENHYDRAMINE HCL 25 MG PO CAPS: 25.0000 mg | ORAL_CAPSULE | Freq: Four times a day (QID) | ORAL | Status: DC | PRN

## 2023-10-16 MED ORDER — PANCRELIPASE (LIP-PROT-AMYL) 12000-38000 UNITS PO CPEP
72000.0000 [IU] | ORAL_CAPSULE | Freq: Three times a day (TID) | ORAL | Status: DC
  Administered 2023-10-16 – 2023-10-18 (×4): 72000 [IU] via ORAL
  Filled 2023-10-16 (×3): qty 2
  Filled 2023-10-16 (×4): qty 6

## 2023-10-16 MED ORDER — ESCITALOPRAM OXALATE 10 MG PO TABS
20.0000 mg | ORAL_TABLET | Freq: Every day | ORAL | Status: DC
  Administered 2023-10-16 – 2023-10-18 (×3): 20 mg via ORAL
  Filled 2023-10-16 (×3): qty 2

## 2023-10-16 MED ORDER — LAMOTRIGINE 100 MG PO TABS
100.0000 mg | ORAL_TABLET | Freq: Two times a day (BID) | ORAL | Status: DC
  Administered 2023-10-16 – 2023-10-18 (×6): 100 mg via ORAL
  Filled 2023-10-16 (×6): qty 1

## 2023-10-16 MED ORDER — BUPROPION HCL ER (XL) 150 MG PO TB24
450.0000 mg | ORAL_TABLET | Freq: Every day | ORAL | Status: DC
  Administered 2023-10-16 – 2023-10-18 (×3): 450 mg via ORAL
  Filled 2023-10-16 (×3): qty 3

## 2023-10-16 NOTE — ED Notes (Signed)
Patient ambulated to restroom with no difficulty; provided gown for patient to change.

## 2023-10-16 NOTE — Assessment & Plan Note (Addendum)
Sliding scale NovoLog. Continue basal insulin. Adjust regimen as needed.

## 2023-10-16 NOTE — Assessment & Plan Note (Addendum)
Continue Synthroid °

## 2023-10-16 NOTE — ED Notes (Signed)
Blood transfusion stopped at 0700 by [facility], RN. This RN will collect morning labs at 0900.

## 2023-10-16 NOTE — Assessment & Plan Note (Addendum)
Hemodynamically stable.  By history, pt denies any signs of bleeding at home, but developed progressive anemia over past several months.  Prior history of iron deficiency in past required iron infusions. --GI is following --For EGD tomorrow --Diet today, NPO at midnight --See Symptomatic Anemia --Avoid NSAID's --IV PPI BID for now

## 2023-10-16 NOTE — Plan of Care (Signed)
  Problem: Coping: Goal: Ability to adjust to condition or change in health will improve Outcome: Progressing   Problem: Metabolic: Goal: Ability to maintain appropriate glucose levels will improve Outcome: Progressing   Problem: Nutritional: Goal: Maintenance of adequate nutrition will improve Outcome: Progressing Goal: Progress toward achieving an optimal weight will improve Outcome: Progressing   Problem: Education: Goal: Knowledge of General Education information will improve Description: Including pain rating scale, medication(s)/side effects and non-pharmacologic comfort measures Outcome: Progressing   Problem: Nutrition: Goal: Adequate nutrition will be maintained Outcome: Progressing   Problem: Coping: Goal: Level of anxiety will decrease Outcome: Progressing   Problem: Pain Management: Goal: General experience of comfort will improve Outcome: Progressing

## 2023-10-16 NOTE — ED Notes (Signed)
Patient to ED 34 from ED 17; no needs at this time.

## 2023-10-16 NOTE — Assessment & Plan Note (Addendum)
Continue statin. 

## 2023-10-16 NOTE — Assessment & Plan Note (Signed)
Hbg 7.1 on admission, transfused 1 unit pRBC's on admission. Hbg 7.1 >> 9.4 post-transfusion --Repeat H&H this afternoon --CBC in AM --Low suspicious for active bleeding, no need for frequent blood draws if stable --GI work up as outlined --Check iron studies, B12, folate, haptoglobin --Transfuse RBCs to keep Hbg > 7

## 2023-10-16 NOTE — ED Notes (Signed)
Maria Bentley NT II took over tele Monitoring

## 2023-10-16 NOTE — Assessment & Plan Note (Addendum)
Continue antihypertensives. ?

## 2023-10-16 NOTE — Progress Notes (Signed)
Progress Note   Patient: Maria Bentley WGN:[id number] DOB: [birth year] DOA: 10/15/2023     1 DOS: the patient was seen and examined on 10/16/2023   Brief hospital course: HPI on admission 10/15/23 PM: "Maria Bentley is a 52 y.o. female with medical history significant for anxiety, osteoarthritis, stage III CKD, type diabetes mellitus, fibromyalgia, hypertension and hypothyroidism, presented to the emergency room with acute onset of abnormal blood work with anemia with hemoglobin 7.1 that was done on an outpatient lab yesterday and she was called for it at 4 PM.  She admits to recent generalized fatigue and tightness as well as dyspnea worsening on exertion.  ..." See H&P for full HPI on admission.  Further hospital course and management as outlined below.    Assessment and Plan: * Symptomatic anemia Hbg 7.1 on admission, transfused 1 unit pRBC's on admission. Hbg 7.1 >> 9.4 post-transfusion --Repeat H&H this afternoon --CBC in AM --Low suspicious for active bleeding, no need for frequent blood draws if stable --GI work up as outlined --Check iron studies, B12, folate, haptoglobin --Transfuse RBCs to keep Hbg > 7  GI bleeding Hemodynamically stable.  By history, pt denies any signs of bleeding at home, but developed progressive anemia over past several months.  Prior history of iron deficiency in past required iron infusions. --GI is following --For EGD tomorrow --Diet today, NPO at midnight --See Symptomatic Anemia --Avoid NSAID's --IV PPI BID for now  Essential hypertension Continue antihypertensives  Type 2 diabetes mellitus without complication, with long-term current use of insulin (HCC) Sliding scale NovoLog. Continue basal insulin. Adjust regimen as needed.  Hypothyroidism Continue Synthroid  Dyslipidemia Continue statin         Subjective: Pt seen in ED, holding for a bed today.  GI in to see pt.  Despite NPO orders, kitchen brought pt a  breakfast tray and she ate a little bit.  EGD will be tomorrow.  She denies any signs of bleeding at home including bloody or dark stools, hematuria, hemetemesis, or any abdominal pain.    Physical Exam: Vitals:   10/16/23 0800 10/16/23 1130 10/16/23 1139 10/16/23 1200  BP: (!) 157/72 133/69  (!) 145/67  Pulse: 98 71  70  Resp: (!) 21 16  14   Temp:   98.1 F (36.7 C)   TempSrc:   Oral   SpO2: 94% 95%  92%  Weight:      Height:       General exam: awake, alert, no acute distress HEENT: clear conjunctiva, anicteric sclera, moist mucus membranes, hearing grossly normal  Respiratory system: CTAB, no wheezes, rales or rhonchi, normal respiratory effort. Cardiovascular system: normal S1/S2, RRR, no JVD, murmurs, rubs, gallops,  no pedal edema.   Gastrointestinal system: soft, NT, ND, no HSM felt, +bowel sounds. Central nervous system: A&O x3. no gross focal neurologic deficits, normal speech Extremities: moves all , no edema, normal tone Skin: dry, intact, normal temperature, normal color, No rashes, lesions or ulcers Psychiatry: normal mood, congruent affect, judgement and insight appear normal   Data Reviewed:  Notable labs Hbg 7.1 >> 9.4 CBG's 193 >> 266,  Cr 1.05 Ca 8.3  Iron 60 / TIBC 521 / ferritin 6 / folate 21   B12 pending  Family Communication: None present. Pt able to update.  Disposition: Status is: Inpatient Remains inpatient appropriate because: Ongoing GI evaluation for symptomatic anemia. EGD tomorrow.   Planned Discharge Destination: Home    Time spent: 45 minutes  Author: Alphonsus Sias  Denton Lank, DO 10/16/2023 2:33 PM  For on call review [email or web address].

## 2023-10-16 NOTE — Consult Note (Signed)
Midge Minium, MD [facility]  [address], [address] [place], [place] [address] Phone: [phone] Fax : [phone]  Consultation  Referring Provider:     Dr. Arville Care Primary Care Physician:  Sherlene Shams, MD Primary Gastroenterologist:  Dr. Servando Snare         Reason for Consultation:     Symptomatic anemia  Date of Admission:  10/15/2023 Date of Consultation:  10/16/2023         HPI:   Maria Bentley is a 52 y.o. female who has a history of gastric bypass and has had anemia in the past requiring frequent transfusions of iron.  The patient had recently seen me for colonoscopy and was found to have polyps.  The colonoscopy was done in June 2024.  The patient had a previous colonoscopy in October 2022 with a poor prep.  The patient had iron studies checked this morning that showed a ferritin of 6 with the rest of the iron studies being normal except for a high TIBC.   Component     Latest Ref Rng 10/03/2021 11/14/2021 10/15/2023 10/16/2023  Hemoglobin     12.0 - 15.0 g/dL 40.1 (L)  02.7 (L)  7.1 (L)  9.4 (L)   Hemoglobin        7.1 Repeated and verified X2. (LL)    HCT     36.0 - 46.0 % 38.8  38.2  28.8 (L)  33.2 (L)   HCT        27.1 Repeated and verified X2. (L)    MCV     80.0 - 100.0 fL 86.2  85.1  76.8 (L)  78.1 (L)   MCV        71.4 (L)     The patient has a history of diabetes chronic kidney disease, fibromyalgia with hypertension and hypothyroidism.  She denies seeing any blood in her stools or any dark stools.  She does report having fatigue and dyspnea on exertion. The patient's BUN was not elevated on admission.  I am now being asked to see the patient for anemia.  Past Medical History:  Diagnosis Date   Abscess of right buttock 04/30/2019   Anemia    Anxiety    Arthritis    Cataract    CKD (chronic kidney disease), stage III (HCC)    COVID-19 virus infection    Depression 2010   Diabetes mellitus    DKA, type 2 (HCC) 10/13/2019   Dysrhythmia     Endometriosis    Family history of adverse reaction to anesthesia    Mother - heart stopped during several procedures using propofol   Fibromyalgia    Headache, common migraine, intractable    History of kidney stones    History of ovarian cyst    left sided, caused by her endometriosis (possibly an endometrioma), in her 5s.   Hyperlipidemia    Hypertension    Hypothyroidism    Kidney stones    left   Morbid obesity (HCC)    s/p gastric bypass   Motion sickness    long car rides   Personal hx of gastric bypass 2010   Rosacea    Vertigo     Past Surgical History:  Procedure Laterality Date   CARPAL TUNNEL RELEASE Right 11/26/2020   Procedure: CARPAL TUNNEL RELEASE;  Surgeon: Kennedy Bucker, MD;  Location: [facility];  Service: Orthopedics;  Laterality: Right;   CATARACT EXTRACTION W/PHACO Right 01/01/2023   Procedure: CATARACT EXTRACTION  PHACO AND INTRAOCULAR LENS PLACEMENT (IOC) RIGHT DIABETIC;  Surgeon: Nevada Crane, MD;  Location: [facility];  Service: Ophthalmology;  Laterality: Right;  10.12 053.5   CATARACT EXTRACTION W/PHACO Left 01/15/2023   Procedure: CATARACT EXTRACTION PHACO AND INTRAOCULAR LENS PLACEMENT (IOC) LEFT DIABETIC 8.99 00:49.5;  Surgeon: Nevada Crane, MD;  Location: [facility];  Service: Ophthalmology;  Laterality: Left;  Diabetic   COLONOSCOPY WITH PROPOFOL N/A 10/21/2020   Procedure: COLONOSCOPY WITH PROPOFOL;  Surgeon: Pasty Spillers, MD;  Location: [facility];  Service: Endoscopy;  Laterality: N/A;   COLONOSCOPY WITH PROPOFOL N/A 09/15/2021   Procedure: COLONOSCOPY WITH PROPOFOL;  Surgeon: Pasty Spillers, MD;  Location: [facility];  Service: Endoscopy;  Laterality: N/A;   COLONOSCOPY WITH PROPOFOL N/A 06/06/2023   Procedure: COLONOSCOPY WITH PROPOFOL;  Surgeon: Midge Minium, MD;  Location: [facility];  Service: Endoscopy;  Laterality: N/A;   DILATION AND CURETTAGE OF UTERUS  12/11/2009   EYE SURGERY      gastric bypass     OPEN REDUCTION INTERNAL FIXATION (ORIF) DISTAL RADIAL FRACTURE Right 11/26/2020   Procedure: Open reduction internal fixation, right distal radius;  Surgeon: Kennedy Bucker, MD;  Location: [facility];  Service: Orthopedics;  Laterality: Right;   OVARIAN CYST REMOVAL     POLYPECTOMY  06/06/2023   Procedure: POLYPECTOMY;  Surgeon: Midge Minium, MD;  Location: [facility] END[facility];  Service: Endoscopy;;   ROTATOR CUFF REPAIR     right   SHOULDER ARTHROSCOPY WITH SUBACROMIAL DECOMPRESSION AND OPEN ROTATOR C Right 07/19/2020   Procedure: Right shoulder arthroscopic vs mini-open rotator cuff repair vs Regeneten patch application, subacomial dcompression, and biceps tenodesis;  Surgeon: Signa Kell, MD;  Location: [facility];  Service: Orthopedics;  Laterality: Right;    Prior to Admission medications   Medication Sig Start Date End Date Taking? Authorizing Provider  amitriptyline (ELAVIL) 50 MG tablet TAKE 1 TABLET BY MOUTH EVERYDAY AT BEDTIME Patient taking differently: Take 50 mg by mouth at bedtime. 09/10/23  Yes Sherlene Shams, MD  aspirin-acetaminophen-caffeine (EXCEDRIN MIGRAINE) [phone] MG tablet Take 2 tablets by mouth every 6 (six) hours as needed for headache.   Yes [provider]  atorvastatin (LIPITOR) 20 MG tablet Take 1 tablet (20 mg total) by mouth daily. 10/15/23  Yes Sherlene Shams, MD  buPROPion (WELLBUTRIN XL) 150 MG 24 hr tablet Take 450 mg by mouth daily.  05/12/18  Yes [provider]  calcium carbonate (OSCAL) 1500 (600 Ca) MG TABS tablet Take 600 mg of elemental calcium by mouth daily with breakfast.   Yes [provider]  cholecalciferol (VITAMIN D3) 25 MCG (1000 UNIT) tablet Take 5,000 Units by mouth daily.   Yes [provider]  CLINPRO 5000 1.1 % PSTE Place 1 application onto teeth at bedtime.  06/15/20  Yes [provider]  cyanocobalamin (VITAMIN B12) 1000 MCG/ML injection Inject 1 mL (1,000 mcg total) into the muscle  every 30 (thirty) days. Inject 1 mL intramuscularly once a week for 4 weeks and then once a month thereafter. 10/15/23  Yes Sherlene Shams, MD  cyclobenzaprine (FLEXERIL) 10 MG tablet Take 0.5-1 tablets (5-10 mg total) by mouth 3 (three) times daily as needed for muscle spasms. 06/05/23  Yes Sherlene Shams, MD  diphenhydrAMINE (BENADRYL) 25 MG tablet Take 25 mg by mouth every 6 (six) hours as needed for allergies.   Yes [provider]  EPINEPHrine 0.3 mg/0.3 mL IJ SOAJ injection Inject 0.3 mg into the muscle  as needed for anaphylaxis.  06/25/10  Yes [provider]  escitalopram (LEXAPRO) 20 MG tablet Take 20 mg by mouth daily.  02/27/17  Yes [provider]  fluticasone (FLONASE) 50 MCG/ACT nasal spray SPRAY 2 SPRAYS INTO EACH NOSTRIL EVERY DAY 04/21/21  Yes Sherlene Shams, MD  HORIZANT 600 MG TBCR TAKE 1 TABLET BY MOUTH TWICE A DAY 07/24/23  Yes Sherlene Shams, MD  insulin aspart (NOVOLOG FLEXPEN) 100 UNIT/ML FlexPen Inject 20 Units into the skin 3 (three) times daily with meals. INJECT 20 UNITS INTO THE SKIN 3 (THREE) TIMES DAILY WITH MEALS. Patient taking differently: Inject 20 Units into the skin 3 (three) times daily with meals. INJECT 20 UNITS INTO THE SKIN 3 (THREE) TIMES DAILY WITH MEALS. (Plus a sliding scale) 10/09/23  Yes Sherlene Shams, MD  insulin glargine (LANTUS SOLOSTAR) 100 UNIT/ML Solostar Pen Inject 45 Units into the skin daily. Patient taking differently: Inject 40 Units into the skin daily. 05/16/23  Yes Sherlene Shams, MD  lamoTRIgine (LAMICTAL) 100 MG tablet Take 100 mg by mouth 2 (two) times daily. 09/12/23  Yes [provider]  levothyroxine (SYNTHROID) 50 MCG tablet Take 1 tablet (50 mcg total) by mouth daily. 10/15/23  Yes Sherlene Shams, MD  lipase/protease/amylase (CREON) 36000 UNITS CPEP capsule TAKE 2 CAPSULES BY MOUTH 3 (THREE) TIMES DAILY BEFORE MEALS AND 1 CAPSULE WITH SNACKS. 05/14/23  Yes Celso Amy, PA-C  LORazepam (ATIVAN) 0.5 MG  tablet Take 1 tablet (0.5 mg total) by mouth daily as needed for anxiety. 05/16/23  Yes Sherlene Shams, MD  losartan (COZAAR) 25 MG tablet TAKE 1 TABLET (25 MG TOTAL) BY MOUTH DAILY. 09/05/23 09/04/24 Yes Sherlene Shams, MD  meclizine (ANTIVERT) 25 MG tablet Take 1 tablet (25 mg total) by mouth 3 (three) times daily as needed for dizziness. 06/16/15  Yes Sherlene Shams, MD  Melatonin 10 MG TABS Take 10 mg by mouth at bedtime as needed (sleep).   Yes [provider]  metoprolol tartrate (LOPRESSOR) 25 MG tablet TAKE 0.5 TABLETS BY MOUTH 2 TIMES DAILY. Patient taking differently: Take 25 mg by mouth 2 (two) times daily. 10/15/23  Yes Sherlene Shams, MD  nystatin (MYCOSTATIN/NYSTOP) powder APPLY 1 APPLICATION TO AFFECTED AREA TWICE A DAY 12/23/20  Yes Glori Luis, MD  albuterol (VENTOLIN HFA) 108 (90 Base) MCG/ACT inhaler Inhale 2 puffs into the lungs every 6 (six) hours as needed for wheezing or shortness of breath. Patient not taking: Reported on 10/16/2023 10/02/19   Remus Loffler, PA-C  amitriptyline (ELAVIL) 25 MG tablet TAKE 1 TABLET BY MOUTH EVERYDAY AT BEDTIME Patient not taking: Reported on 10/16/2023 07/10/23   Sherlene Shams, MD  Azelastine HCl 137 MCG/SPRAY SOLN  11/22/21   [provider]  Continuous Glucose Sensor (FREESTYLE LIBRE 3 SENSOR) MISC Place 1 sensor on the skin every 14 days. Use to check glucose continuously 05/16/23   Sherlene Shams, MD  cyclobenzaprine (FLEXERIL) 5 MG tablet Take 1 tablet (5 mg total) by mouth 3 (three) times daily as needed. Patient not taking: Reported on 10/16/2023 03/21/23   Menshew, Charlesetta Ivory, PA-C  furosemide (LASIX) 20 MG tablet TAKE 1 TABLET (20 MG TOTAL) BY MOUTH DAILY. AS NEEDED FOR FLUID RETENTION Patient not taking: Reported on 10/16/2023 02/01/21   Sherlene Shams, MD  Insulin Pen Needle (BD PEN NEEDLE NANO U/F) 32G X 4 MM MISC Please specify directions, refills and quantity 05/31/22  Sherlene Shams, MD  NURTEC 75 MG TBDP  Take 1 tablet by mouth daily as needed. Patient not taking: Reported on 10/16/2023 11/25/21   [provider]  OnabotulinumtoxinA (BOTOX IJ) Inject as directed every 3 (three) months. For migraine Patient not taking: Reported on 10/16/2023    [provider]  ondansetron (ZOFRAN ODT) 4 MG disintegrating tablet Take 1 tablet (4 mg total) by mouth every 8 (eight) hours as needed for nausea or vomiting. Patient not taking: Reported on 10/16/2023 10/03/21   Chesley Noon, MD  polyethylene glycol powder (GLYCOLAX/MIRALAX) 17 GM/SCOOP powder Mix 1 capful in a drink once daily for Constipation. Patient not taking: Reported on 10/16/2023 05/14/23   Celso Amy, PA-C  SYRINGE-NEEDLE, DISP, 3 ML (BD INTEGRA SYRINGE) 25G X 1" 3 ML MISC FOR USE WITH B12 INJECTIONS WEEKLY/MONTHLY 05/25/23   Sherlene Shams, MD    Family History  Problem Relation Age of Onset   Diabetes Mother    Hypertension Mother    Hypertension Father    Diabetes Father    Depression Other        strong hx of   Breast cancer Paternal Grandmother 44   Breast cancer Maternal Grandmother    Cancer Neg Hx      Social History   Tobacco Use   Smoking status: Former    Current packs/day: 0.00    Average packs/day: 1 pack/day for 4.0 years (4.0 ttl pk-yrs)    Types: Cigarettes    Start date: 02/05/1992    Quit date: 02/05/1996    Years since quitting: 27.7   Smokeless tobacco: Never  Vaping Use   Vaping status: Never Used  Substance Use Topics   Alcohol use: No   Drug use: No    Allergies as of 10/15/2023 - Review Complete 10/15/2023  Allergen Reaction Noted   Amoxicillin Hives, Rash, Itching, and Swelling 02/05/2012   Avocado Swelling 05/29/2015   Citrullus vulgaris Hives and Swelling 04/02/2012   Clarithromycin Hives and Rash 02/05/2012   Clarithromycin Anaphylaxis, Hives, and Rash 02/05/2012   Honey Anaphylaxis 04/02/2012   Other Hives and Swelling 05/29/2015   Sulfa antibiotics Anaphylaxis 02/05/2012    Sulfa drugs cross reactors Anaphylaxis 02/05/2012   Watermelon concentrate Hives and Swelling 04/02/2012   Charentais melon (french melon)  04/02/2012   Insulin detemir Rash 06/11/2018   Silicone Itching and Rash 04/02/2012   Tape Itching and Rash 04/02/2012   Victoza [liraglutide] Nausea Only 06/11/2018    Review of Systems:    All systems reviewed and negative except where noted in HPI.   Physical Exam:  Vital signs in last 24 hours: Temp:  [98 F (36.7 C)-98.5 F (36.9 C)] 98.2 F (36.8 C) (11/05 0733) Pulse Rate:  [91-127] 98 (11/05 0800) Resp:  [11-22] 21 (11/05 0800) BP: (135-157)/(59-95) 157/72 (11/05 0800) SpO2:  [92 %-99 %] 94 % (11/05 0800) Weight:  [105.2 kg] 105.2 kg (11/04 1646) Last BM Date : 10/14/23 General:   Pleasant, cooperative in NAD Head:  Normocephalic and atraumatic. Eyes:   No icterus.   Conjunctiva pink. PERRLA. Ears:  Normal auditory acuity. Lungs: Respirations even and unlabored. Lungs clear to auscultation bilaterally.   No wheezes, crackles, or rhonchi.  Heart:  Regular rate and rhythm;  Without murmur, clicks, rubs or gallops Abdomen:  Soft, nondistended, nontender. Normal bowel sounds. No appreciable masses or hepatomegaly.  No rebound or guarding.  Rectal:  Not performed. Msk:  Symmetrical without gross deformities.   Extremities:  Without  edema, cyanosis or clubbing. Neurologic:  Alert and oriented x3;  grossly normal neurologically. Skin:  Intact without significant lesions or rashes. Psych:  Alert and cooperative. Normal affect.  LAB RESULTS: Recent Labs    10/15/23 1204 10/15/23 1649 10/16/23 0821  WBC 8.6 11.1* 8.8  HGB 7.1 Repeated and verified X2.* 7.1* 9.4*  HCT 27.1 Repeated and verified X2.* 28.8* 33.2*  PLT 527.0* 535* 437*   BMET Recent Labs    10/15/23 1204 10/15/23 1649 10/16/23 0821  NA 138 139 136  K 4.5 4.5 3.9  CL 102 104 103  CO2 27 25 23   GLUCOSE 176* 87 207*  BUN 15 18 18   CREATININE 1.13 1.26* 1.05*   CALCIUM 9.4 9.0 8.3*   LFT Recent Labs    10/15/23 1204  PROT 6.9  ALBUMIN 3.5  AST 15  ALT 15  ALKPHOS 87  BILITOT 0.3   PT/INR No results for input(s): "LABPROT", "INR" in the last 72 hours.  STUDIES: DG Chest Port 1 View  Result Date: 10/15/2023 CLINICAL DATA:  Shortness of breath EXAM: PORTABLE CHEST 1 VIEW COMPARISON:  07/22/2020 FINDINGS: Lungs are clear.  No pleural effusion or pneumothorax. Eventration of the right hemidiaphragm, chronic. The heart is normal in size. IMPRESSION: No acute cardiopulmonary disease. Electronically Signed   By: Charline Bills M.D.   On: 10/15/2023 23:56      Impression / Plan:   Assessment: Principal Problem:   GI bleeding Active Problems:   Dyslipidemia   Type 2 diabetes mellitus without complication, with long-term current use of insulin (HCC)   Essential hypertension   Hypothyroidism   Maria Bentley is a 52 y.o. y/o female with anemia with elevated TIBC and low ferritin.  The patient was admitted with symptomatic anemia.  The patient had a colonoscopy earlier this year with some polyps removed.  She is not reporting any sign of any GI blood loss.  She reports that she has had symptoms like this in the past without a extensive GI workup and was treated with IV infusions of iron.  Plan:  This patient has recurrent anemia with symptoms associated with the anemia.  The patient has had a colonoscopy and does not need to repeat the colonoscopy at this time.  The patient had been fed this morning despite being n.p.o. and is unable to undergo any procedures today.  The patient has been told that the next thing to investigate would be an upper endoscopy and if the upper endoscopy is negative then a capsule endoscopy to fully evaluate her GI tract for possible blood loss.  The patient has also been told that if these are negative she may need to go on to regular iron infusions that she had in the past.  The patient has been explained  the plan and agrees with it.  Thank you for involving me in the care of this patient.      LOS: 1 day   Midge Minium, MD, [facility] 10/16/2023, 11:29 AM,  Pager [phone] 7am-5pm  Check AMION for 5pm -7am coverage and on weekends   Note: This dictation was prepared with Dragon dictation along with smaller phrase technology. Any transcriptional errors that result from this process are unintentional.

## 2023-10-16 NOTE — Inpatient Diabetes Management (Signed)
Inpatient Diabetes Program Recommendations  AACE/ADA: New Consensus Statement on Inpatient Glycemic Control (2015)  Target Ranges:  Prepandial:   less than 140 mg/dL      Peak postprandial:   less than 180 mg/dL (1-2 hours)      Critically ill patients:  140 - 180 mg/dL   Lab Results  Component Value Date   GLUCAP 266 (H) 10/16/2023   HGBA1C 7.2 (A) 10/15/2023    Latest Reference Range & Units 10/16/23 08:36 10/16/23 11:29  Glucose-Capillary 70 - 99 mg/dL 086 (H) 578 (H)  (H): Data is abnormally high  Review of Glycemic Control  Diabetes history: DM2 Outpatient Diabetes medications: Lantus 40 units daily, Novolog 20 units tid meal coverage Current orders for Inpatient glycemic control: Semglee 45 units daily, Novolog 0-9 q 4 hrs.  Inpatient Diabetes Program Recommendations:   May consider:' -Decrease Semglee to 20 units daily if fasting CBG <90  Thank you, Billy Fischer. Yechiel Erny, RN, MSN, CDCES  Diabetes Coordinator Inpatient Glycemic Control Team Team Pager [phone] (8am-5pm) 10/16/2023 12:18 PM

## 2023-10-17 ENCOUNTER — Inpatient Hospital Stay: Payer: BC Managed Care – PPO | Admitting: Anesthesiology

## 2023-10-17 ENCOUNTER — Encounter
Admission: EM | Disposition: A | Discharge status: Home / Self Care | Payer: Self-pay | Source: Ambulatory Visit | Attending: Emergency Medicine

## 2023-10-17 ENCOUNTER — Telehealth: Payer: Self-pay

## 2023-10-17 ENCOUNTER — Encounter: Payer: Self-pay | Admitting: Internal Medicine

## 2023-10-17 DIAGNOSIS — D649 Anemia, unspecified: Secondary | ICD-10-CM | POA: Diagnosis not present

## 2023-10-17 HISTORY — PX: FOREIGN BODY REMOVAL: SHX962

## 2023-10-17 HISTORY — PX: ESOPHAGOGASTRODUODENOSCOPY (EGD) WITH PROPOFOL: SHX5813

## 2023-10-17 LAB — BPAM RBC
Blood Product Expiration Date: 202411262359
Blood Product Expiration Date: 202411272359
ISSUE DATE / TIME: 202411042354
ISSUE DATE / TIME: 202411050345
Unit Type and Rh: 202411272359
Unit Type and Rh: 6200

## 2023-10-17 LAB — TYPE AND SCREEN
ABO/RH(D): A POS
Antibody Screen: NEGATIVE
Unit division: 0
Unit division: 0

## 2023-10-17 LAB — BASIC METABOLIC PANEL
Anion gap: 7 (ref 5–15)
BUN: 24 mg/dL — ABNORMAL HIGH (ref 6–20)
CO2: 30 mmol/L (ref 22–32)
Calcium: 8.5 mg/dL — ABNORMAL LOW (ref 8.9–10.3)
Chloride: 107 mmol/L (ref 98–111)
Creatinine, Ser: 1.14 mg/dL — ABNORMAL HIGH (ref 0.44–1.00)
GFR, Estimated: 58 mL/min — ABNORMAL LOW (ref >60)
Glucose, Bld: 93 mg/dL (ref 70–99)
Potassium: 4.1 mmol/L (ref 3.5–5.1)
Sodium: 140 mmol/L (ref 135–145)

## 2023-10-17 LAB — CBC
HCT: 34 % — ABNORMAL LOW (ref 36.0–46.0)
Hemoglobin: 9.5 g/dL — ABNORMAL LOW (ref 12.0–15.0)
MCH: 21.9 pg — ABNORMAL LOW (ref 26.0–34.0)
MCHC: 27.9 g/dL — ABNORMAL LOW (ref 30.0–36.0)
MCV: 78.5 fL — ABNORMAL LOW (ref 80.0–100.0)
Platelets: 511 10*3/uL — ABNORMAL HIGH (ref 150–400)
RBC: 4.33 MIL/uL (ref 3.87–5.11)
RDW: 21.5 % — ABNORMAL HIGH (ref 11.5–15.5)
WBC: 10.5 10*3/uL (ref 4.0–10.5)
nRBC: 0.2 % (ref 0.0–0.2)

## 2023-10-17 LAB — GLUCOSE, CAPILLARY
Glucose-Capillary: 92 mg/dL (ref 70–99)
Glucose-Capillary: 82 mg/dL (ref 70–99)
Glucose-Capillary: 142 mg/dL — ABNORMAL HIGH (ref 70–99)
Glucose-Capillary: 175 mg/dL — ABNORMAL HIGH (ref 70–99)
Glucose-Capillary: 310 mg/dL — ABNORMAL HIGH (ref 70–99)
Glucose-Capillary: 261 mg/dL — ABNORMAL HIGH (ref 70–99)
Glucose-Capillary: 75 mg/dL (ref 70–99)

## 2023-10-17 LAB — VITAMIN B12: Vitamin B-12: >7500 pg/mL — ABNORMAL HIGH (ref 180–914)

## 2023-10-17 SURGERY — ESOPHAGOGASTRODUODENOSCOPY (EGD) WITH PROPOFOL
Anesthesia: General

## 2023-10-17 MED ORDER — SODIUM CHLORIDE 0.9 % IV SOLN: INTRAVENOUS | Status: DC | PRN

## 2023-10-17 MED ORDER — DEXTROSE 50 % IV SOLN: 12.5000 g | INTRAVENOUS | Status: AC

## 2023-10-17 MED ORDER — PROPOFOL 500 MG/50ML IV EMUL
INTRAVENOUS | Status: DC | PRN
  Administered 2023-10-17: 125 ug/kg/min via INTRAVENOUS

## 2023-10-17 MED ORDER — PROPOFOL 10 MG/ML IV BOLUS
INTRAVENOUS | Status: DC | PRN
  Administered 2023-10-17: 80 mg via INTRAVENOUS

## 2023-10-17 MED ORDER — LIDOCAINE HCL (CARDIAC) PF 100 MG/5ML IV SOSY
PREFILLED_SYRINGE | INTRAVENOUS | Status: DC | PRN
  Administered 2023-10-17: 40 mg via INTRAVENOUS

## 2023-10-17 MED ORDER — DEXTROSE 50 % IV SOLN
INTRAVENOUS | Status: AC
  Administered 2023-10-17: 12.5 g via INTRAVENOUS
  Filled 2023-10-17: qty 50

## 2023-10-17 NOTE — Transfer of Care (Signed)
Immediate Anesthesia Transfer of Care Note  Patient: [name]  Procedure(s) Performed: Procedure(s): ESOPHAGOGASTRODUODENOSCOPY (EGD) WITH PROPOFOL (N/A)  Patient Location: PACU and Endoscopy Unit  Anesthesia Type:General  Level of Consciousness: sedated  Airway & Oxygen Therapy: Patient Spontanous Breathing and Patient connected to nasal cannula oxygen  Post-op Assessment: Report given to RN and Post -op Vital signs reviewed and stable  Post vital signs: Reviewed and stable  Last Vitals:  Vitals:   10/17/23 0858 10/17/23 1058  BP: (!) 153/73 (!) 141/47  Pulse: 78 81  Resp: 16 (!) 23  Temp: (!) 36.3 C   SpO2: (!) 88% (!) 87%    Complications: No apparent anesthesia complications

## 2023-10-17 NOTE — Plan of Care (Signed)
  Problem: Tissue Perfusion: Goal: Adequacy of tissue perfusion will improve Outcome: Progressing   Problem: Elimination: Goal: Will not experience complications related to bowel motility Outcome: Progressing

## 2023-10-17 NOTE — Progress Notes (Signed)
CBG was 69. Pt felt her sugar drop. Pt is NPO. IV dextrose administered.  Repeat CBG was 139. CBG checked again after 15 min was 92.  0348 CBG: 82.  Previous CBG's had been 216 and 218, each requiring 3 units Novolog.

## 2023-10-17 NOTE — Anesthesia Procedure Notes (Signed)
Date/Time: 10/17/2023 10:45 AM  Performed by: Stormy Fabian, CRNAPre-anesthesia Checklist: Patient identified, Emergency Drugs available, Suction available and Patient being monitored Patient Re-evaluated:Patient Re-evaluated prior to induction Oxygen Delivery Method: Nasal cannula Induction Type: IV induction Dental Injury: Teeth and Oropharynx as per pre-operative assessment  Comments: Nasal cannula with etCO2 monitoring

## 2023-10-17 NOTE — Progress Notes (Signed)
Progress Note   Patient: Maria Bentley ZOX:[id number] DOB: 1971/02/05 DOA: 10/15/2023     2 DOS: the patient was seen and examined on 10/17/2023     Brief hospital course: HPI on admission 10/15/23 PM: "Maria Bentley is a 52 y.o. female with medical history significant for anxiety, osteoarthritis, stage III CKD, type diabetes mellitus, fibromyalgia, hypertension and hypothyroidism, presented to the emergency room with acute onset of abnormal blood work with anemia with hemoglobin 7.1 that was done on an outpatient lab yesterday and she was called for it at 4 PM.  She admits to recent generalized fatigue and tightness as well as dyspnea worsening on exertion.  ..." See H&P for full HPI on admission.   Further hospital course and management as outlined below.       Assessment and Plan: * Symptomatic anemia Hbg 7.1 on admission, transfused 1 unit pRBC's on admission. Hbg 7.1 >> 9.4 post-transfusion --Repeat H&H this afternoon -- Monitor CBC tomorrow morning --Low suspicious for active bleeding, no need for frequent blood draws if stable --GI work up as outlined --Check iron studies, B12, folate, haptoglobin --Transfuse RBCs to keep Hbg > 7   GI bleeding Hemodynamically stable.  By history, pt denies any signs of bleeding at home, but developed progressive anemia over past several months.  Prior history of iron deficiency in past required iron infusions. --GI is following -- For EGD today by gastroenterologist -- Advancing diet after EGD --See Symptomatic Anemia --Avoid NSAID's --IV PPI BID for now   Essential hypertension Continue antihypertensives   Type 2 diabetes mellitus without complication, with long-term current use of insulin (HCC) Sliding scale NovoLog. Continue basal insulin therapy Adjust regimen as needed.   Hypothyroidism Continue Synthroid   Dyslipidemia Continue statin therapy    Subjective:  Seen and examined at bedside this  morning Patient is planned for EGD today by gastroenterologist Denies nausea vomiting abdominal pain chest pain   Physical Exam: General exam: awake, alert, no acute distress HEENT: clear conjunctiva, anicteric sclera, moist mucus membranes, hearing grossly normal  Respiratory system: CTAB, no wheezes, rales or rhonchi, normal respiratory effort. Cardiovascular system: normal S1/S2, RRR, no JVD, murmurs, rubs, gallops,  no pedal edema.   Gastrointestinal system: soft, NT, ND, no HSM felt, +bowel sounds. Central nervous system: A&O x3. no gross focal neurologic deficits, normal speech Extremities: moves all , no edema, normal tone Skin: dry, intact, normal temperature, normal color, No rashes, lesions or ulcers Psychiatry: normal mood, congruent affect, judgement and insight appear normal     Data Reviewed:      Latest Ref Rng & Units 10/17/2023    4:56 AM 10/16/2023    6:34 PM 10/16/2023    8:21 AM  CBC  WBC 4.0 - 10.5 K/uL 10.5   8.8   Hemoglobin 12.0 - 15.0 g/dL 9.5  9.2  9.4   Hematocrit 36.0 - 46.0 % 34.0  32.6  33.2   Platelets 150 - 400 K/uL 511   437          Latest Ref Rng & Units 10/17/2023    4:56 AM 10/16/2023    8:21 AM 10/15/2023    4:49 PM  BMP  Glucose 70 - 99 mg/dL 93  811  87   BUN 6 - 20 mg/dL 24  18  18    Creatinine 0.44 - 1.00 mg/dL 9.14  7.82  9.56   Sodium 135 - 145 mmol/L 140  136  139   Potassium 3.5 -  5.1 mmol/L 4.1  3.9  4.5   Chloride 98 - 111 mmol/L 107  103  104   CO2 22 - 32 mmol/L 30  23  25    Calcium 8.9 - 10.3 mg/dL 8.5  8.3  9.0     Iron 60 / TIBC 521 / ferritin 6 / folate 21    B12 pending   Family Communication: None present. Pt able to update.   Disposition: Status is: Inpatient Remains inpatient appropriate because: Ongoing GI evaluation for symptomatic anemia. EGD today    Planned Discharge Destination: Home       Time spent: 40 minutes   Vitals:   10/17/23 0858 10/17/23 1058 10/17/23 1107 10/17/23 1130  BP: (!) 153/73  (!) 141/47 (!) 122/54 136/79  Pulse: 78 81 67 70  Resp: 16 (!) 23 (!) 24 18  Temp: (!) 97.4 F (36.3 C)   98 F (36.7 C)  TempSrc: Oral   Oral  SpO2: (!) 88% (!) 87% 97% 100%  Weight:      Height:         Author: Loyce Dys, MD 10/17/2023 3:50 PM  For on call review [email or web address].

## 2023-10-17 NOTE — Op Note (Signed)
[facility] Gastroenterology Patient Name: Maria Bentley Procedure Date: 10/17/2023 10:30 AM MRN: [id number] Account #: [id number] Date of Birth: 11-Jan-1971 Admit Type: Outpatient Age: 52 Room: [facility] ENDO ROOM 4 Gender: Female Note Status: Finalized Instrument Name: Upper Endoscope [id number] Procedure:             Upper GI endoscopy Indications:           Iron deficiency anemia Providers:             Midge Minium MD, MD Referring MD:          Duncan Dull, MD (Referring MD) Medicines:             Propofol per Anesthesia Complications:         No immediate complications. Procedure:             Pre-Anesthesia Assessment:                        - Prior to the procedure, a History and Physical was                         performed, and patient medications and allergies were                         reviewed. The patient's tolerance of previous                         anesthesia was also reviewed. The risks and benefits                         of the procedure and the sedation options and risks                         were discussed with the patient. All questions were                         answered, and informed consent was obtained. Prior                         Anticoagulants: The patient has taken no anticoagulant                         or antiplatelet agents. ASA Grade Assessment: II - A                         patient with mild systemic disease. After reviewing                         the risks and benefits, the patient was deemed in                         satisfactory condition to undergo the procedure.                        After obtaining informed consent, the endoscope was                         passed under direct vision. Throughout the procedure,  the patient's blood pressure, pulse, and oxygen                         saturations were monitored continuously. The Endoscope                         was introduced through the mouth, and  advanced to the                         jejunum. The upper GI endoscopy was accomplished                         without difficulty. The patient tolerated the                         procedure well. Findings:      The examined esophagus was normal.      Evidence of a gastric bypass was found. A gastric pouch with a normal       size was found. The gastrojejunal anastomosis was characterized by an       intact staple line. Removal of staple was accomplished with a regular       forceps.      The examined jejunum was normal. Impression:            - Normal esophagus.                        - Gastric bypass with a normal-sized pouch.                         Gastrojejunal anastomosis characterized by an intact                         staple line.                        - Normal examined jejunum. Recommendation:        - Return patient to hospital ward for ongoing care.                        - Resume previous diet.                        - Continue present medications.                        - Perform an upper endoscopy with biopsies.                        - Follow with hemotology for iron infusions Procedure Code(s):     --- Professional ---                        [phone], Esophagogastroduodenoscopy, flexible,                         transoral; with removal of foreign body(s) Diagnosis Code(s):     --- Professional ---                        D50.9, Iron deficiency anemia, unspecified CPT copyright  2022 American Medical Association. All rights reserved. The codes documented in this report are preliminary and upon coder review may  be revised to meet current compliance requirements. Midge Minium MD, MD 10/17/2023 10:54:22 AM This report has been signed electronically. Number of Addenda: 0 Note Initiated On: 10/17/2023 10:30 AM Estimated Blood Loss:  Estimated blood loss: none.      [facility]

## 2023-10-17 NOTE — Anesthesia Postprocedure Evaluation (Signed)
Anesthesia Post Note  Patient: Maria Bentley  Procedure(s) Performed: ESOPHAGOGASTRODUODENOSCOPY (EGD) WITH PROPOFOL  Patient location during evaluation: PACU Anesthesia Type: General Level of consciousness: awake and alert, oriented and patient cooperative Pain management: pain level controlled Vital Signs Assessment: post-procedure vital signs reviewed and stable Respiratory status: spontaneous breathing, nonlabored ventilation and respiratory function stable Cardiovascular status: blood pressure returned to baseline and stable Postop Assessment: adequate PO intake Anesthetic complications: no   No notable events documented.   Last Vitals:  Vitals:   10/17/23 1107 10/17/23 1130  BP: (!) 122/54 136/79  Pulse: 67 70  Resp: (!) 24 18  Temp:  36.7 C  SpO2: 97% 100%    Last Pain:  Vitals:   10/17/23 1130  TempSrc: Oral  PainSc: 0-No pain                 Reed Breech

## 2023-10-17 NOTE — Telephone Encounter (Signed)
capsule endoscopy for anemia

## 2023-10-17 NOTE — Anesthesia Preprocedure Evaluation (Addendum)
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), LAFB, LVH with repol abnormality   Neuro/Psych  Headaches PSYCHIATRIC DISORDERS Anxiety Depression       GI/Hepatic S/p gastric bypass 2010   Endo/Other  diabetes, Type 2, Insulin DependentHypothyroidism  Class 3 obesity  Renal/GU Renal disease (stage II CKD)     Musculoskeletal  (+) Arthritis ,  Fibromyalgia -  Abdominal   Peds  Hematology  (+) Blood dyscrasia, anemia   Anesthesia Other Findings   Reproductive/Obstetrics                             Anesthesia Physical Anesthesia Plan  ASA: 3  Anesthesia Plan: General   Post-op Pain Management:    Induction: Intravenous  PONV Risk Score and Plan: 3 and Propofol infusion, TIVA and Treatment may vary due to age or medical condition  Airway Management Planned: Natural Airway  Additional Equipment:   Intra-op Plan:   Post-operative Plan:   Informed Consent: I have reviewed the patients History and Physical, chart, labs and discussed the procedure including the risks, benefits and alternatives for the proposed anesthesia with the patient or authorized representative who has indicated his/her understanding and acceptance.       Plan Discussed with: CRNA  Anesthesia Plan Comments: (LMA/GETA backup discussed.  Patient consented for risks of anesthesia including but  not limited to:  - adverse reactions to medications - damage to eyes, teeth, lips or other oral mucosa - nerve damage due to positioning  - sore throat or hoarseness - damage to heart, brain, nerves, lungs, other parts of body or loss of life  Informed patient about role of CRNA in peri- and intra-operative care.  Patient voiced understanding.)        Anesthesia Quick Evaluation

## 2023-10-18 ENCOUNTER — Encounter: Payer: Self-pay | Admitting: Gastroenterology

## 2023-10-18 DIAGNOSIS — D649 Anemia, unspecified: Secondary | ICD-10-CM | POA: Diagnosis not present

## 2023-10-18 LAB — CBC WITH DIFFERENTIAL/PLATELET
Abs Immature Granulocytes: 0.03 10*3/uL (ref 0.00–0.07)
Basophils Absolute: 0.1 10*3/uL (ref 0.0–0.1)
Basophils Relative: 1 %
Eosinophils Absolute: 0.4 10*3/uL (ref 0.0–0.5)
Eosinophils Relative: 4 %
HCT: 33.5 % — ABNORMAL LOW (ref 36.0–46.0)
Hemoglobin: 9 g/dL — ABNORMAL LOW (ref 12.0–15.0)
Immature Granulocytes: 0 %
Lymphocytes Relative: 26 %
Lymphs Abs: 2.7 10*3/uL (ref 0.7–4.0)
MCH: 21.8 pg — ABNORMAL LOW (ref 26.0–34.0)
MCHC: 26.9 g/dL — ABNORMAL LOW (ref 30.0–36.0)
MCV: 81.3 fL (ref 80.0–100.0)
Monocytes Absolute: 0.7 10*3/uL (ref 0.1–1.0)
Monocytes Relative: 7 %
Neutro Abs: 6.2 10*3/uL (ref 1.7–7.7)
Neutrophils Relative %: 62 %
Platelets: 477 10*3/uL — ABNORMAL HIGH (ref 150–400)
RBC: 4.12 MIL/uL (ref 3.87–5.11)
RDW: 22.1 % — ABNORMAL HIGH (ref 11.5–15.5)
Smear Review: NORMAL
WBC: 10.1 10*3/uL (ref 4.0–10.5)
nRBC: 0 % (ref 0.0–0.2)

## 2023-10-18 LAB — GLUCOSE, CAPILLARY
Glucose-Capillary: 211 mg/dL — ABNORMAL HIGH (ref 70–99)
Glucose-Capillary: 178 mg/dL — ABNORMAL HIGH (ref 70–99)
Glucose-Capillary: 270 mg/dL — ABNORMAL HIGH (ref 70–99)

## 2023-10-18 LAB — HAPTOGLOBIN: Haptoglobin: 219 mg/dL (ref 33–346)

## 2023-10-18 MED ORDER — FERROUS SULFATE 325 (65 FE) MG PO TBEC: 325.0000 mg | DELAYED_RELEASE_TABLET | Freq: Two times a day (BID) | ORAL | 3 refills | Status: DC

## 2023-10-18 MED ORDER — ACETAMINOPHEN 325 MG PO TABS: 650.0000 mg | ORAL_TABLET | Freq: Four times a day (QID) | ORAL | 0 refills | Status: AC | PRN

## 2023-10-18 MED ORDER — METOPROLOL TARTRATE 25 MG PO TABS: 25.0000 mg | ORAL_TABLET | Freq: Two times a day (BID) | ORAL | 0 refills | Status: DC

## 2023-10-18 NOTE — Plan of Care (Signed)
  Problem: Health Behavior/Discharge Planning: Goal: Ability to identify and utilize available resources and services will improve Outcome: Progressing   Problem: Nutritional: Goal: Maintenance of adequate nutrition will improve Outcome: Progressing   Problem: Tissue Perfusion: Goal: Adequacy of tissue perfusion will improve Outcome: Progressing   Problem: Clinical Measurements: Goal: Diagnostic test results will improve Outcome: Progressing Goal: Cardiovascular complication will be avoided Outcome: Progressing   Problem: Activity: Goal: Risk for activity intolerance will decrease Outcome: Progressing   Problem: Elimination: Goal: Will not experience complications related to bowel motility Outcome: Progressing   Problem: Pain Management: Goal: General experience of comfort will improve Outcome: Progressing   Problem: Safety: Goal: Ability to remain free from injury will improve Outcome: Progressing

## 2023-10-18 NOTE — Progress Notes (Signed)
Maria Bentley to be discharged Home per MD order. Discussed prescriptions and follow up appointments with the patient. Prescriptions given to patient, medication list explained in detail. Patient verbalized understanding.  Allergies as of 10/18/2023       Reactions   Amoxicillin Hives, Rash, Itching, Swelling   Avocado Swelling   Citrullus Vulgaris Hives, Swelling   Oral swelling, ears swell shut   Clarithromycin Hives, Rash   Clarithromycin Anaphylaxis, Hives, Rash   Honey Anaphylaxis   Other Hives, Swelling   All melon   Sulfa Antibiotics Anaphylaxis   Sulfa Drugs Cross Reactors Anaphylaxis   Watermelon Concentrate Hives, Swelling   Oral swelling, ears swell shut   Charentais Melon (french Melon)    Other reaction(s): Other (See Comments)   Insulin Detemir Rash   Red/swollen injection site reactions with residual lumps   Silicone Itching, Rash   Skin breakdown   Tape Itching, Rash   Skin breakdown   Victoza [liraglutide] Nausea Only        Medication List     STOP taking these medications    Nurtec 75 MG Tbdp Generic drug: Rimegepant Sulfate       TAKE these medications    acetaminophen 325 MG tablet Commonly known as: TYLENOL Take 2 tablets (650 mg total) by mouth every 6 (six) hours as needed for mild pain (pain score 1-3) (or Fever >/= 101).   albuterol 108 (90 Base) MCG/ACT inhaler Commonly known as: VENTOLIN HFA Inhale 2 puffs into the lungs every 6 (six) hours as needed for wheezing or shortness of breath.   amitriptyline 50 MG tablet Commonly known as: ELAVIL TAKE 1 TABLET BY MOUTH EVERYDAY AT BEDTIME What changed:  See the new instructions. Another medication with the same name was removed. Continue taking this medication, and follow the directions you see here.   aspirin-acetaminophen-caffeine 250-250-65 MG tablet Commonly known as: EXCEDRIN MIGRAINE Take 2 tablets by mouth every 6 (six) hours as needed for headache.   atorvastatin 20  MG tablet Commonly known as: LIPITOR Take 1 tablet (20 mg total) by mouth daily.   Azelastine HCl 137 MCG/SPRAY Soln   BD Integra Syringe 25G X 1" 3 ML Misc Generic drug: SYRINGE-NEEDLE (DISP) 3 ML FOR USE WITH B12 INJECTIONS WEEKLY/MONTHLY   BD Pen Needle Nano U/F 32G X 4 MM Misc Generic drug: Insulin Pen Needle Please specify directions, refills and quantity   BOTOX IJ Inject as directed every 3 (three) months. For migraine   buPROPion 150 MG 24 hr tablet Commonly known as: WELLBUTRIN XL Take 450 mg by mouth daily.   calcium carbonate 1500 (600 Ca) MG Tabs tablet Commonly known as: OSCAL Take 600 mg of elemental calcium by mouth daily with breakfast.   cholecalciferol 25 MCG (1000 UNIT) tablet Commonly known as: VITAMIN D3 Take 5,000 Units by mouth daily.   Clinpro 5000 1.1 % Pste Generic drug: Sodium Fluoride Place 1 application onto teeth at bedtime.   cyanocobalamin 1000 MCG/ML injection Commonly known as: VITAMIN B12 Inject 1 mL (1,000 mcg total) into the muscle every 30 (thirty) days. Inject 1 mL intramuscularly once a week for 4 weeks and then once a month thereafter.   cyclobenzaprine 10 MG tablet Commonly known as: FLEXERIL Take 0.5-1 tablets (5-10 mg total) by mouth 3 (three) times daily as needed for muscle spasms. What changed: Another medication with the same name was removed. Continue taking this medication, and follow the directions you see here.   diphenhydrAMINE 25 MG tablet  Commonly known as: BENADRYL Take 25 mg by mouth every 6 (six) hours as needed for allergies.   EPINEPHrine 0.3 mg/0.3 mL Soaj injection Commonly known as: EPI-PEN Inject 0.3 mg into the muscle as needed for anaphylaxis.   escitalopram 20 MG tablet Commonly known as: LEXAPRO Take 20 mg by mouth daily.   ferrous sulfate 325 (65 FE) MG EC tablet Take 1 tablet (325 mg total) by mouth 2 (two) times daily.   fluticasone 50 MCG/ACT nasal spray Commonly known as: FLONASE SPRAY  2 SPRAYS INTO EACH NOSTRIL EVERY DAY   FreeStyle Libre 3 Sensor Misc Place 1 sensor on the skin every 14 days. Use to check glucose continuously   furosemide 20 MG tablet Commonly known as: LASIX TAKE 1 TABLET (20 MG TOTAL) BY MOUTH DAILY. AS NEEDED FOR FLUID RETENTION   Horizant 600 MG Tbcr Generic drug: Gabapentin Enacarbil TAKE 1 TABLET BY MOUTH TWICE A DAY   lamoTRIgine 100 MG tablet Commonly known as: LAMICTAL Take 100 mg by mouth 2 (two) times daily.   Lantus SoloStar 100 UNIT/ML Solostar Pen Generic drug: insulin glargine Inject 45 Units into the skin daily. What changed: how much to take   levothyroxine 50 MCG tablet Commonly known as: SYNTHROID Take 1 tablet (50 mcg total) by mouth daily.   lipase/protease/amylase 16109 UNITS Cpep capsule Commonly known as: Creon TAKE 2 CAPSULES BY MOUTH 3 (THREE) TIMES DAILY BEFORE MEALS AND 1 CAPSULE WITH SNACKS.   LORazepam 0.5 MG tablet Commonly known as: ATIVAN Take 1 tablet (0.5 mg total) by mouth daily as needed for anxiety.   losartan 25 MG tablet Commonly known as: COZAAR TAKE 1 TABLET (25 MG TOTAL) BY MOUTH DAILY.   meclizine 25 MG tablet Commonly known as: ANTIVERT Take 1 tablet (25 mg total) by mouth 3 (three) times daily as needed for dizziness.   Melatonin 10 MG Tabs Take 10 mg by mouth at bedtime as needed (sleep).   metoprolol tartrate 25 MG tablet Commonly known as: LOPRESSOR Take 1 tablet (25 mg total) by mouth 2 (two) times daily.   NovoLOG FlexPen 100 UNIT/ML FlexPen Generic drug: insulin aspart Inject 20 Units into the skin 3 (three) times daily with meals. INJECT 20 UNITS INTO THE SKIN 3 (THREE) TIMES DAILY WITH MEALS. What changed: additional instructions   nystatin powder Commonly known as: MYCOSTATIN/NYSTOP APPLY 1 APPLICATION TO AFFECTED AREA TWICE A DAY   ondansetron 4 MG disintegrating tablet Commonly known as: Zofran ODT Take 1 tablet (4 mg total) by mouth every 8 (eight) hours as  needed for nausea or vomiting.   polyethylene glycol powder 17 GM/SCOOP powder Commonly known as: GLYCOLAX/MIRALAX Mix 1 capful in a drink once daily for Constipation.        Vitals:   10/18/23 0340 10/18/23 0820  BP: 133/81 129/62  Pulse: 72 69  Resp: 18 18  Temp: 98 F (36.7 C) 97.9 F (36.6 C)  SpO2: 95% 95%    Skin clean, dry and intact without evidence of skin break down and or skin tears. IV catheter discontinued intact. Site without signs and symptoms of complications. Dressing and pressure applied. Patient denies pain at this time. No complaints noted.  An After Visit Summary was printed and given to the patient. Patient escorted via wheelchair and discharged Home home via private auto.  Madie Reno, RN

## 2023-10-18 NOTE — Discharge Summary (Signed)
Physician Discharge Summary   Patient: Maria Bentley MRN: [id number] DOB: [birth year]  Admit date:     10/15/2023  Discharge date: 10/18/23  Discharge Physician: Loyce Dys   PCP: Sherlene Shams, MD   Recommendations at discharge:    Discharge Diagnoses: Symptomatic anemia secondary to iron deficiency anemia GI bleeding Essential hypertension Type 2 diabetes mellitus without complication, with long-term current use of insulin (HCC) Hypothyroidism Dyslipidemia   Hospital Course: Maria Bentley is a 52 y.o. female with medical history significant for anxiety, osteoarthritis, stage III CKD, type diabetes mellitus, fibromyalgia, hypertension and hypothyroidism, presented to the emergency room with acute onset of abnormal blood work with anemia with hemoglobin 7.1 that was done on an outpatient lab yesterday and she was called for it at 4 PM.  She admits to recent generalized fatigue and tightness as well as dyspnea worsening on exertion.  For iron infusion patient underwent EGD that was unremarkable.  GI has recommended hematology follow-up for iron infusion.  Will globin have remained stable and so patient is being discharged   Consultants: Gastroenterologist Procedures performed: EGD Disposition: Home Diet recommendation:  Cardiac diet DISCHARGE MEDICATION: Allergies as of 10/18/2023       Reactions   Amoxicillin Hives, Rash, Itching, Swelling   Avocado Swelling   Citrullus Vulgaris Hives, Swelling   Oral swelling, ears swell shut   Clarithromycin Hives, Rash   Clarithromycin Anaphylaxis, Hives, Rash   Honey Anaphylaxis   Other Hives, Swelling   All melon   Sulfa Antibiotics Anaphylaxis   Sulfa Drugs Cross Reactors Anaphylaxis   Watermelon Concentrate Hives, Swelling   Oral swelling, ears swell shut   Charentais Melon (french Melon)    Other reaction(s): Other (See Comments)   Insulin Detemir Rash   Red/swollen injection site reactions with residual  lumps   Silicone Itching, Rash   Skin breakdown   Tape Itching, Rash   Skin breakdown   Victoza [liraglutide] Nausea Only        Medication List     STOP taking these medications    Nurtec 75 MG Tbdp Generic drug: Rimegepant Sulfate       TAKE these medications    acetaminophen 325 MG tablet Commonly known as: TYLENOL Take 2 tablets (650 mg total) by mouth every 6 (six) hours as needed for mild pain (pain score 1-3) (or Fever >/= 101).   albuterol 108 (90 Base) MCG/ACT inhaler Commonly known as: VENTOLIN HFA Inhale 2 puffs into the lungs every 6 (six) hours as needed for wheezing or shortness of breath.   amitriptyline 50 MG tablet Commonly known as: ELAVIL TAKE 1 TABLET BY MOUTH EVERYDAY AT BEDTIME What changed:  See the new instructions. Another medication with the same name was removed. Continue taking this medication, and follow the directions you see here.   aspirin-acetaminophen-caffeine 250-250-65 MG tablet Commonly known as: EXCEDRIN MIGRAINE Take 2 tablets by mouth every 6 (six) hours as needed for headache.   atorvastatin 20 MG tablet Commonly known as: LIPITOR Take 1 tablet (20 mg total) by mouth daily.   Azelastine HCl 137 MCG/SPRAY Soln   BD Integra Syringe 25G X 1" 3 ML Misc Generic drug: SYRINGE-NEEDLE (DISP) 3 ML FOR USE WITH B12 INJECTIONS WEEKLY/MONTHLY   BD Pen Needle Nano U/F 32G X 4 MM Misc Generic drug: Insulin Pen Needle Please specify directions, refills and quantity   BOTOX IJ Inject as directed every 3 (three) months. For migraine   buPROPion 150 MG 24 hr  tablet Commonly known as: WELLBUTRIN XL Take 450 mg by mouth daily.   calcium carbonate 1500 (600 Ca) MG Tabs tablet Commonly known as: OSCAL Take 600 mg of elemental calcium by mouth daily with breakfast.   cholecalciferol 25 MCG (1000 UNIT) tablet Commonly known as: VITAMIN D3 Take 5,000 Units by mouth daily.   Clinpro 5000 1.1 % Pste Generic drug: Sodium  Fluoride Place 1 application onto teeth at bedtime.   cyanocobalamin 1000 MCG/ML injection Commonly known as: VITAMIN B12 Inject 1 mL (1,000 mcg total) into the muscle every 30 (thirty) days. Inject 1 mL intramuscularly once a week for 4 weeks and then once a month thereafter.   cyclobenzaprine 10 MG tablet Commonly known as: FLEXERIL Take 0.5-1 tablets (5-10 mg total) by mouth 3 (three) times daily as needed for muscle spasms. What changed: Another medication with the same name was removed. Continue taking this medication, and follow the directions you see here.   diphenhydrAMINE 25 MG tablet Commonly known as: BENADRYL Take 25 mg by mouth every 6 (six) hours as needed for allergies.   EPINEPHrine 0.3 mg/0.3 mL Soaj injection Commonly known as: EPI-PEN Inject 0.3 mg into the muscle as needed for anaphylaxis.   escitalopram 20 MG tablet Commonly known as: LEXAPRO Take 20 mg by mouth daily.   ferrous sulfate 325 (65 FE) MG EC tablet Take 1 tablet (325 mg total) by mouth 2 (two) times daily.   fluticasone 50 MCG/ACT nasal spray Commonly known as: FLONASE SPRAY 2 SPRAYS INTO EACH NOSTRIL EVERY DAY   FreeStyle Libre 3 Sensor Misc Place 1 sensor on the skin every 14 days. Use to check glucose continuously   furosemide 20 MG tablet Commonly known as: LASIX TAKE 1 TABLET (20 MG TOTAL) BY MOUTH DAILY. AS NEEDED FOR FLUID RETENTION   Horizant 600 MG Tbcr Generic drug: Gabapentin Enacarbil TAKE 1 TABLET BY MOUTH TWICE A DAY   lamoTRIgine 100 MG tablet Commonly known as: LAMICTAL Take 100 mg by mouth 2 (two) times daily.   Lantus SoloStar 100 UNIT/ML Solostar Pen Generic drug: insulin glargine Inject 45 Units into the skin daily. What changed: how much to take   levothyroxine 50 MCG tablet Commonly known as: SYNTHROID Take 1 tablet (50 mcg total) by mouth daily.   lipase/protease/amylase 95621 UNITS Cpep capsule Commonly known as: Creon TAKE 2 CAPSULES BY MOUTH 3 (THREE)  TIMES DAILY BEFORE MEALS AND 1 CAPSULE WITH SNACKS.   LORazepam 0.5 MG tablet Commonly known as: ATIVAN Take 1 tablet (0.5 mg total) by mouth daily as needed for anxiety.   losartan 25 MG tablet Commonly known as: COZAAR TAKE 1 TABLET (25 MG TOTAL) BY MOUTH DAILY.   meclizine 25 MG tablet Commonly known as: ANTIVERT Take 1 tablet (25 mg total) by mouth 3 (three) times daily as needed for dizziness.   Melatonin 10 MG Tabs Take 10 mg by mouth at bedtime as needed (sleep).   metoprolol tartrate 25 MG tablet Commonly known as: LOPRESSOR Take 1 tablet (25 mg total) by mouth 2 (two) times daily.   NovoLOG FlexPen 100 UNIT/ML FlexPen Generic drug: insulin aspart Inject 20 Units into the skin 3 (three) times daily with meals. INJECT 20 UNITS INTO THE SKIN 3 (THREE) TIMES DAILY WITH MEALS. What changed: additional instructions   nystatin powder Commonly known as: MYCOSTATIN/NYSTOP APPLY 1 APPLICATION TO AFFECTED AREA TWICE A DAY   ondansetron 4 MG disintegrating tablet Commonly known as: Zofran ODT Take 1 tablet (4 mg total)  by mouth every 8 (eight) hours as needed for nausea or vomiting.   polyethylene glycol powder 17 GM/SCOOP powder Commonly known as: GLYCOLAX/MIRALAX Mix 1 capful in a drink once daily for Constipation.        Follow-up Information     Earna Coder, MD. Schedule an appointment as soon as possible for a visit.   Specialties: Internal Medicine, Oncology Contact information: [address] [place] [address] [phone]                Discharge Exam: Ceasar Mons Weights   10/15/23 1646 10/16/23 2118  Weight: 105.2 kg 105.2 kg   General exam: awake, alert, no acute distress HEENT: clear conjunctiva, anicteric sclera, moist mucus membranes, hearing grossly normal  Respiratory system: CTAB, no wheezes, rales or rhonchi, normal respiratory effort. Cardiovascular system: normal S1/S2, RRR, no JVD, murmurs, rubs, gallops,  no pedal  edema.   Gastrointestinal system: soft, NT, ND, no HSM felt, +bowel sounds. Central nervous system: A&O x3. no gross focal neurologic deficits, normal speech Extremities: moves all , no edema, normal tone Skin: dry, intact, normal temperature, normal color, No rashes, lesions or ulcers Psychiatry: normal mood, congruent affect, judgement and insight appear normal    Condition at discharge: good  The results of significant diagnostics from this hospitalization (including imaging, microbiology, ancillary and laboratory) are listed below for reference.   Imaging Studies: DG Chest Port 1 View  Result Date: 10/15/2023 CLINICAL DATA:  Shortness of breath EXAM: PORTABLE CHEST 1 VIEW COMPARISON:  07/22/2020 FINDINGS: Lungs are clear.  No pleural effusion or pneumothorax. Eventration of the right hemidiaphragm, chronic. The heart is normal in size. IMPRESSION: No acute cardiopulmonary disease. Electronically Signed   By: Charline Bills M.D.   On: 10/15/2023 23:56    Microbiology: Results for orders placed or performed during the hospital encounter of 05/06/22  Urine Culture     Status: Abnormal   Collection Time: 05/06/22  9:41 AM   Specimen: Urine, Clean Catch  Result Value Ref Range Status   Specimen Description URINE, CLEAN CATCH  Final   Special Requests   Final    NONE Performed at [facility], [address], [place], [place] [address]    Culture >=100,000 COLONIES/mL ESCHERICHIA COLI (A)  Final   Report Status 05/09/2022 FINAL  Final   Organism ID, Bacteria ESCHERICHIA COLI (A)  Final      Susceptibility   Escherichia coli - MIC*    AMPICILLIN >=32 RESISTANT Resistant     CEFAZOLIN <=4 SENSITIVE Sensitive     CEFEPIME <=0.12 SENSITIVE Sensitive     CEFTRIAXONE <=0.25 SENSITIVE Sensitive     CIPROFLOXACIN <=0.25 SENSITIVE Sensitive     GENTAMICIN <=1 SENSITIVE Sensitive     IMIPENEM <=0.25 SENSITIVE Sensitive     NITROFURANTOIN <=16 SENSITIVE Sensitive      TRIMETH/SULFA >=320 RESISTANT Resistant     AMPICILLIN/SULBACTAM 16 INTERMEDIATE Intermediate     PIP/TAZO <=4 SENSITIVE Sensitive     * >=100,000 COLONIES/mL ESCHERICHIA COLI   *Note: Due to a large number of results and/or encounters for the requested time period, some results have not been displayed. A complete set of results can be found in Results Review.    Labs: CBC: Recent Labs  Lab 10/15/23 1204 10/15/23 1649 10/16/23 0821 10/16/23 1834 10/17/23 0456 10/18/23 0351  WBC 8.6 11.1* 8.8  --  10.5 10.1  NEUTROABS 5.7  --   --   --   --  6.2  HGB 7.1 Repeated and verified X2.* 7.1* 9.4* 9.2* 9.5* 9.0*  HCT 27.1 Repeated and verified X2.* 28.8* 33.2* 32.6* 34.0* 33.5*  MCV 71.4* 76.8* 78.1*  --  78.5* 81.3  PLT 527.0* 535* 437*  --  511* 477*   Basic Metabolic Panel: Recent Labs  Lab 10/15/23 1204 10/15/23 1649 10/16/23 0821 10/17/23 0456  NA 138 139 136 140  K 4.5 4.5 3.9 4.1  CL 102 104 103 107  CO2 27 25 23 30   GLUCOSE 176* 87 207* 93  BUN 15 18 18  24*  CREATININE 1.13 1.26* 1.05* 1.14*  CALCIUM 9.4 9.0 8.3* 8.5*   Liver Function Tests: Recent Labs  Lab 10/15/23 1204  AST 15  ALT 15  ALKPHOS 87  BILITOT 0.3  PROT 6.9  ALBUMIN 3.5   CBG: Recent Labs  Lab 10/17/23 1735 10/17/23 1942 10/17/23 2315 10/18/23 0338 10/18/23 0837  GLUCAP 310* 261* 75 211* 178*    Discharge time spent:  34 minutes.  Signed: Loyce Dys, MD [facility] 10/18/2023

## 2023-10-23 ENCOUNTER — Encounter: Payer: Self-pay | Admitting: Internal Medicine

## 2023-11-14 NOTE — Telephone Encounter (Signed)
Called pt, no answer and VM not set up 

## 2023-11-26 ENCOUNTER — Other Ambulatory Visit: Payer: Self-pay | Admitting: Internal Medicine

## 2023-11-28 ENCOUNTER — Encounter: Payer: Self-pay | Admitting: Internal Medicine

## 2023-11-28 ENCOUNTER — Other Ambulatory Visit: Payer: Self-pay | Admitting: Internal Medicine

## 2023-11-29 ENCOUNTER — Other Ambulatory Visit: Payer: Self-pay | Admitting: Internal Medicine

## 2023-11-29 MED ORDER — FREESTYLE LIBRE 3 PLUS SENSOR MISC: 2 refills | Status: DC

## 2023-11-30 ENCOUNTER — Other Ambulatory Visit: Payer: Self-pay

## 2023-11-30 ENCOUNTER — Other Ambulatory Visit: Payer: Self-pay | Admitting: Internal Medicine

## 2023-11-30 NOTE — Telephone Encounter (Signed)
Pt stated that insurance is not covering the [place] any more but will cover the dexcom. I have pended the order for approval.

## 2023-11-30 NOTE — Telephone Encounter (Signed)
Copied from CRM [id number]. Topic: Clinical - Medication Refill >> Nov 30, 2023  4:46 PM Sim Boast F wrote: Most Recent Primary Care Visit:  Provider: Sherlene Shams  Department: [facility]  Visit Type: OFFICE VISIT  Date: 10/15/2023  Medication: Dexcom G7 (replacement medication)  Has the patient contacted their pharmacy? Yes, they told her they no longer cover previous med and this med will replace it   (Agent: If no, request that the patient contact the pharmacy for the refill. If patient does not wish to contact the pharmacy document the reason why and proceed with request.) (Agent: If yes, when and what did the pharmacy advise?)  Is this the correct pharmacy for this prescription? Yes If no, delete pharmacy and type the correct one.  This is the patient's preferred pharmacy:    [id number] - [address] [place] [address] Phone: [phone] Fax: [phone]   Has the prescription been filled recently? No  Is the patient out of the medication? Yes, she has 2 hours worth of medication left. Would like to pick up medication tomorrow 12/21 since she is going out of town Sunday  Has the patient been seen for an appointment in the last year OR does the patient have an upcoming appointment? Yes  Can we respond through MyChart? No  Agent: Please be advised that Rx refills may take up to 3 business days. We ask that you follow-up with your pharmacy.

## 2023-12-01 MED ORDER — DEXCOM G7 SENSOR MISC: 1 refills | Status: DC

## 2023-12-03 ENCOUNTER — Telehealth: Payer: Self-pay | Admitting: Pharmacy Technician

## 2023-12-03 ENCOUNTER — Encounter: Payer: Self-pay | Admitting: Oncology

## 2023-12-03 ENCOUNTER — Other Ambulatory Visit (HOSPITAL_COMMUNITY): Payer: Self-pay

## 2023-12-03 NOTE — Telephone Encounter (Signed)
Pt is aware and gave a verbal understanding.  

## 2023-12-03 NOTE — Telephone Encounter (Signed)
Pharmacy Patient Advocate Encounter   Received notification from CoverMyMeds that prior authorization for Dexcom G7 Sensor is required/requested.   Insurance verification completed.   The patient is insured through [facility] [facility] .  (Key: [id number])   Received notification from [facility] that Prior Authorization for Dexcom G7 Sensor has been APPROVED from 12/03/2023 to 12/01/2024   PA #/Case ID/Reference #: PA Case ID #: [id number]   **See uploaded approval letter under Media**

## 2023-12-10 ENCOUNTER — Encounter: Payer: Self-pay | Admitting: Oncology

## 2023-12-12 ENCOUNTER — Other Ambulatory Visit: Payer: Self-pay | Admitting: Internal Medicine

## 2023-12-17 ENCOUNTER — Ambulatory Visit: Payer: BC Managed Care – PPO | Admitting: Internal Medicine

## 2024-01-07 ENCOUNTER — Encounter: Payer: Self-pay | Admitting: Internal Medicine

## 2024-01-07 MED ORDER — NOVOLOG FLEXPEN 100 UNIT/ML ~~LOC~~ SOPN: 20.0000 [IU] | PEN_INJECTOR | Freq: Three times a day (TID) | SUBCUTANEOUS | 1 refills | Status: DC

## 2024-01-09 ENCOUNTER — Encounter: Payer: Self-pay | Admitting: Internal Medicine

## 2024-02-02 ENCOUNTER — Other Ambulatory Visit: Payer: Self-pay | Admitting: Internal Medicine

## 2024-02-13 ENCOUNTER — Ambulatory Visit: Payer: BC Managed Care – PPO | Admitting: Internal Medicine

## 2024-02-13 DIAGNOSIS — N182 Chronic kidney disease, stage 2 (mild): Secondary | ICD-10-CM

## 2024-02-13 DIAGNOSIS — E7800 Pure hypercholesterolemia, unspecified: Secondary | ICD-10-CM

## 2024-02-13 DIAGNOSIS — E039 Hypothyroidism, unspecified: Secondary | ICD-10-CM

## 2024-02-13 DIAGNOSIS — I10 Essential (primary) hypertension: Secondary | ICD-10-CM

## 2024-02-27 ENCOUNTER — Encounter: Payer: Self-pay | Admitting: Internal Medicine

## 2024-02-27 MED ORDER — NOVOLOG FLEXPEN 100 UNIT/ML ~~LOC~~ SOPN: 20.0000 [IU] | PEN_INJECTOR | Freq: Three times a day (TID) | SUBCUTANEOUS | 1 refills | Status: DC

## 2024-02-27 MED ORDER — LOSARTAN POTASSIUM 25 MG PO TABS: 25.0000 mg | ORAL_TABLET | Freq: Every day | ORAL | 1 refills | Status: DC

## 2024-03-05 ENCOUNTER — Ambulatory Visit: Admitting: Internal Medicine

## 2024-03-12 ENCOUNTER — Encounter: Payer: Self-pay | Admitting: Oncology

## 2024-03-12 ENCOUNTER — Other Ambulatory Visit: Payer: Self-pay

## 2024-03-12 ENCOUNTER — Encounter: Payer: Self-pay | Admitting: Internal Medicine

## 2024-03-12 ENCOUNTER — Ambulatory Visit
Admission: RE | Admit: 2024-03-12 | Discharge: 2024-03-12 | Disposition: A | Discharge status: Home / Self Care | Source: Ambulatory Visit | Attending: Emergency Medicine | Admitting: Emergency Medicine

## 2024-03-12 VITALS — BP 171/92 | HR 95 | Temp 98.3°F | Resp 22

## 2024-03-12 DIAGNOSIS — N3000 Acute cystitis without hematuria: Secondary | ICD-10-CM | POA: Insufficient documentation

## 2024-03-12 LAB — POCT URINALYSIS DIP (MANUAL ENTRY)
Bilirubin, UA: NEGATIVE
Glucose, UA: NEGATIVE mg/dL
Ketones, POC UA: NEGATIVE mg/dL
Nitrite, UA: POSITIVE — AB
Protein Ur, POC: 30 mg/dL — AB
Spec Grav, UA: 1.02
Urobilinogen, UA: 1 U/dL
pH, UA: 5.5

## 2024-03-12 MED ORDER — NITROFURANTOIN MONOHYD MACRO 100 MG PO CAPS: 100.0000 mg | ORAL_CAPSULE | Freq: Two times a day (BID) | ORAL | 0 refills | Status: DC

## 2024-03-12 NOTE — Discharge Instructions (Signed)
Your urinalysis shows Maria Bentley blood cells and nitrates which are indicative of infection, your urine will be sent to the lab to determine exactly which bacteria is present, if any changes need to be made to your medications you will be notified  Begin use of Macrobid twice daily for 5 days   You may use over-the-counter Azo to help minimize your symptoms until antibiotic removes bacteria, this medication will turn your urine orange  Increase your fluid intake through use of water  As always practice good hygiene, wiping front to back and avoidance of scented vaginal products to prevent further irritation  If symptoms continue to persist after use of medication or recur please follow-up with urgent care or your primary doctor as needed  

## 2024-03-12 NOTE — ED Provider Notes (Signed)
 Maria Bentley    CSN: [id number] Arrival date & time: 03/12/24  1339      History   Chief Complaint Chief Complaint  Patient presents with   Urinary Frequency    Suspect uti. - Entered by patient   Dysuria         HPI Maria Bentley is a 53 y.o. female.   Patient presents for evaluation of urinary frequency and dysuria present for 3 days.  Associated lower abdominal pressure and worsening back pain primarily to the right side.  Has attempted use of Azo.  History of reoccurring UTI.  Denies fever, hematuria or vaginal symptoms.  Past Medical History:  Diagnosis Date   Abscess of right buttock 04/30/2019   Anemia    Anxiety    Arthritis    Cataract    CKD (chronic kidney disease), stage III (HCC)    COVID-19 virus infection    Depression 2010   Diabetes mellitus    DKA, type 2 (HCC) 10/13/2019   Dysrhythmia    Endometriosis    Family history of adverse reaction to anesthesia    Mother - heart stopped during several procedures using propofol   Fibromyalgia    Headache, common migraine, intractable    History of kidney stones    History of ovarian cyst    left sided, caused by her endometriosis (possibly an endometrioma), in her 58s.   Hyperlipidemia    Hypertension    Hypothyroidism    Kidney stones    left   Morbid obesity (HCC)    s/p gastric bypass   Motion sickness    long car rides   Personal hx of gastric bypass 2010   Rosacea    Vertigo     Patient Active Problem List   Diagnosis Date Noted   Dyslipidemia 10/16/2023   Type 2 diabetes mellitus without complication, with long-term current use of insulin (HCC) 10/16/2023   Essential hypertension 10/16/2023   Hypothyroidism 10/16/2023   Symptomatic anemia 10/16/2023   GI bleeding 10/15/2023   Polyp of sigmoid colon 06/06/2023   Colon cancer screening 06/06/2023   B12 deficiency 05/18/2023   Knee pain 12/23/2022   Other symptoms and signs involving cognitive functions and awareness  09/14/2022   Radiculitis, cervical 11/28/2021   COVID-19 long hauler manifesting chronic fatigue 11/28/2021   History of UTI 11/28/2021   Polyp of transverse colon    COVID-19 long hauler manifesting chronic concentration deficit 07/09/2021   Encounter for screening colonoscopy    Pancreatic insufficiency 09/04/2020   Diarrhea 09/04/2020   Hematuria 08/25/2020   Stage 3a chronic kidney disease (HCC) 08/25/2020   Analgesic overuse headache 08/24/2020   Atypical facial pain 08/24/2020   Bruxism (teeth grinding) 08/24/2020   Cervical syndrome 08/24/2020   Disorder of autonomic nervous system 08/24/2020   Fibromyositis 08/24/2020   Temporomandibular joint disorder 08/24/2020   Hypoglycemic reaction 08/21/2020   Coronary atherosclerosis due to calcified coronary lesion 08/21/2020   Renal mass, left 07/06/2020   Chronic right shoulder pain 02/09/2020   Recurrent headache 01/13/2020   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 12/30/2019   Elevated alkaline phosphatase level 11/21/2019   Hospital discharge follow-up 11/04/2019   Cervical spondylosis without myelopathy 10/29/2019   Pressure injury of skin 10/16/2019   History of gastric bypass 10/13/2019   COVID-19 virus infection 10/06/2019   Preoperative evaluation of a medical condition to rule out surgical contraindications (TAR required) 09/21/2019   Morbid obesity with BMI of 40.0-44.9,  adult (HCC) 06/24/2019   History of bariatric surgery 04/15/2019   Depressive disorder 11/12/2018   Fibromyalgia 06/05/2018   Iron deficiency anemia 06/05/2018   Hypertension 11/24/2017   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 disease (HCC) 05/15/2017   Cervico-occipital neuralgia 05/02/2017   Low back pain 02/02/2017   Hip pain, chronic 12/28/2015   Pain in joint, ankle and foot 12/28/2015   Insomnia due to psychological stress 09/09/2015   Nonadherence to medical treatment 06/08/2015   Right hip pain 05/30/2014    Vitamin D deficiency 05/30/2014   Tachycardia 08/15/2013   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/2013   Sinusitis, acute 03/20/2013   Morbid obesity (HCC) 06/09/2012   Anxiety    Headache, common migraine, intractable    Hyperlipidemia    Hypothyroid    OSA (obstructive sleep apnea) 02/05/2012    Past Surgical History:  Procedure Laterality Date   CARPAL TUNNEL RELEASE Right 11/26/2020   Procedure: CARPAL TUNNEL RELEASE;  Surgeon: Kennedy Bucker, MD;  Location: [facility];  Service: Orthopedics;  Laterality: Right;   CATARACT EXTRACTION W/PHACO Right 01/01/2023   Procedure: CATARACT EXTRACTION PHACO AND INTRAOCULAR LENS PLACEMENT (IOC) RIGHT DIABETIC;  Surgeon: Nevada Crane, MD;  Location: [facility] [place] [facility];  Service: Ophthalmology;  Laterality: Right;  10.12 053.5   CATARACT EXTRACTION W/PHACO Left 01/15/2023   Procedure: CATARACT EXTRACTION PHACO AND INTRAOCULAR LENS PLACEMENT (IOC) LEFT DIABETIC 8.99 00:49.5;  Surgeon: Nevada Crane, MD;  Location: [facility];  Service: Ophthalmology;  Laterality: Left;  Diabetic   COLONOSCOPY WITH PROPOFOL N/A 10/21/2020   Procedure: COLONOSCOPY WITH PROPOFOL;  Surgeon: Pasty Spillers, MD;  Location: [facility];  Service: Endoscopy;  Laterality: N/A;   COLONOSCOPY WITH PROPOFOL N/A 09/15/2021   Procedure: COLONOSCOPY WITH PROPOFOL;  Surgeon: Pasty Spillers, MD;  Location: [facility];  Service: Endoscopy;  Laterality: N/A;   COLONOSCOPY WITH PROPOFOL N/A 06/06/2023   Procedure: COLONOSCOPY WITH PROPOFOL;  Surgeon: Midge Minium, MD;  Location: [facility];  Service: Endoscopy;  Laterality: N/A;   DILATION AND CURETTAGE OF UTERUS  12/11/2009   ESOPHAGOGASTRODUODENOSCOPY (EGD) WITH PROPOFOL N/A 10/17/2023   Procedure: ESOPHAGOGASTRODUODENOSCOPY (EGD) WITH PROPOFOL;  Surgeon: Midge Minium, MD;  Location: [facility];  Service: Endoscopy;  Laterality: N/A;   EYE SURGERY     FOREIGN BODY REMOVAL  10/17/2023    Procedure: FOREIGN BODY REMOVAL;  Surgeon: Midge Minium, MD;  Location: [facility];  Service: Endoscopy;;   gastric bypass     OPEN REDUCTION INTERNAL FIXATION (ORIF) DISTAL RADIAL FRACTURE Right 11/26/2020   Procedure: Open reduction internal fixation, right distal radius;  Surgeon: Kennedy Bucker, MD;  Location: [facility];  Service: Orthopedics;  Laterality: Right;   OVARIAN CYST REMOVAL     POLYPECTOMY  06/06/2023   Procedure: POLYPECTOMY;  Surgeon: Midge Minium, MD;  Location: [facility];  Service: Endoscopy;;   ROTATOR CUFF REPAIR     right   SHOULDER ARTHROSCOPY WITH SUBACROMIAL DECOMPRESSION AND OPEN ROTATOR C Right 07/19/2020   Procedure: Right shoulder arthroscopic vs mini-open rotator cuff repair vs Regeneten patch application, subacomial dcompression, and biceps tenodesis;  Surgeon: Signa Kell, MD;  Location: [facility];  Service: Orthopedics;  Laterality: Right;    OB History     Gravida  0   Para  0   Term  0   Preterm  0   AB  0   Living  0      SAB  0   IAB  0  Ectopic  0   Multiple  0   Live Births  0            Home Medications    Prior to Admission medications   Medication Sig Start Date End Date Taking? Authorizing Provider  nitrofurantoin, macrocrystal-monohydrate, (MACROBID) 100 MG capsule Take 1 capsule (100 mg total) by mouth 2 (two) times daily. 03/12/24  Yes Zoie Sarin, Elita Boone, NP  acetaminophen (TYLENOL) 325 MG tablet Take 2 tablets (650 mg total) by mouth every 6 (six) hours as needed for mild pain (pain score 1-3) (or Fever >/= 101). 10/18/23   Loyce Dys, MD  albuterol (VENTOLIN HFA) 108 (90 Base) MCG/ACT inhaler Inhale 2 puffs into the lungs every 6 (six) hours as needed for wheezing or shortness of breath. Patient not taking: Reported on 10/16/2023 10/02/19   Remus Loffler, PA-C  amitriptyline (ELAVIL) 50 MG tablet TAKE 1 TABLET BY MOUTH EVERYDAY AT BEDTIME Patient taking differently: Take 50 mg by mouth at bedtime. 09/10/23    Sherlene Shams, MD  aspirin-acetaminophen-caffeine (EXCEDRIN MIGRAINE) [phone] MG tablet Take 2 tablets by mouth every 6 (six) hours as needed for headache.    [provider]  atorvastatin (LIPITOR) 20 MG tablet Take 1 tablet (20 mg total) by mouth daily. 10/15/23   Sherlene Shams, MD  Azelastine HCl 137 MCG/SPRAY SOLN  11/22/21   [provider]  BD INTEGRA SYRINGE 25G X 1" 3 ML MISC FOR USE WITH B12 INJECTIONS WEEKLY/MONTHLY 11/30/23   Sherlene Shams, MD  buPROPion (WELLBUTRIN XL) 150 MG 24 hr tablet Take 450 mg by mouth daily.  05/12/18   [provider]  calcium carbonate (OSCAL) 1500 (600 Ca) MG TABS tablet Take 600 mg of elemental calcium by mouth daily with breakfast.    [provider]  cholecalciferol (VITAMIN D3) 25 MCG (1000 UNIT) tablet Take 5,000 Units by mouth daily.    [provider]  CLINPRO 5000 1.1 % PSTE Place 1 application onto teeth at bedtime.  06/15/20   [provider]  Continuous Glucose Sensor (DEXCOM G7 SENSOR) MISC APPLY 1 SENSOR EVERY 10 DAYS 02/04/24   Sherlene Shams, MD  Continuous Glucose Sensor (FREESTYLE LIBRE 3 PLUS SENSOR) MISC Change sensor every 15 days. 11/29/23   Sherlene Shams, MD  Continuous Glucose Sensor (FREESTYLE LIBRE 3 SENSOR) MISC PLACE 1 SENSOR ON THE SKIN EVERY 14 DAYS. USE TO CHECK GLUCOSE CONTINUOUSLY 11/30/23   Sherlene Shams, MD  cyanocobalamin (VITAMIN B12) 1000 MCG/ML injection Inject 1 mL (1,000 mcg total) into the muscle every 30 (thirty) days. Inject 1 mL intramuscularly once a week for 4 weeks and then once a month thereafter. 10/15/23   Sherlene Shams, MD  cyclobenzaprine (FLEXERIL) 10 MG tablet Take 0.5-1 tablets (5-10 mg total) by mouth 3 (three) times daily as needed for muscle spasms. 06/05/23   Sherlene Shams, MD  diphenhydrAMINE (BENADRYL) 25 MG tablet Take 25 mg by mouth every 6 (six) hours as needed for allergies.    [provider]  EPINEPHrine 0.3 mg/0.3 mL IJ  SOAJ injection Inject 0.3 mg into the muscle as needed for anaphylaxis.  06/25/10   [provider]  escitalopram (LEXAPRO) 20 MG tablet Take 20 mg by mouth daily.  02/27/17   [provider]  ferrous sulfate 325 (65 FE) MG EC tablet Take 1 tablet (325 mg total) by mouth 2 (two) times daily. 10/18/23 10/17/24  Loyce Dys, MD  fluticasone (  FLONASE) 50 MCG/ACT nasal spray SPRAY 2 SPRAYS INTO EACH NOSTRIL EVERY DAY 04/21/21   Sherlene Shams, MD  furosemide (LASIX) 20 MG tablet TAKE 1 TABLET (20 MG TOTAL) BY MOUTH DAILY. AS NEEDED FOR FLUID RETENTION Patient not taking: Reported on 10/16/2023 02/01/21   Sherlene Shams, MD  HORIZANT 600 MG TBCR TAKE 1 TABLET BY MOUTH TWICE A DAY 07/24/23   Sherlene Shams, MD  insulin aspart (NOVOLOG FLEXPEN) 100 UNIT/ML FlexPen Inject 20 Units into the skin 3 (three) times daily with meals. INJECT 20 UNITS INTO THE SKIN 3 (THREE) TIMES DAILY WITH MEALS. 02/27/24   Sherlene Shams, MD  insulin glargine (LANTUS SOLOSTAR) 100 UNIT/ML Solostar Pen Inject 45 Units into the skin daily. Patient taking differently: Inject 40 Units into the skin daily. 05/16/23   Sherlene Shams, MD  Insulin Pen Needle (BD PEN NEEDLE NANO U/F) 32G X 4 MM MISC Please specify directions, refills and quantity 05/31/22   Sherlene Shams, MD  lamoTRIgine (LAMICTAL) 100 MG tablet Take 100 mg by mouth 2 (two) times daily. 09/12/23   [provider]  levothyroxine (SYNTHROID) 50 MCG tablet TAKE 1 TABLET BY MOUTH EVERY DAY 11/26/23   Sherlene Shams, MD  lipase/protease/amylase (CREON) 36000 UNITS CPEP capsule TAKE 2 CAPSULES BY MOUTH 3 (THREE) TIMES DAILY BEFORE MEALS AND 1 CAPSULE WITH SNACKS. 05/14/23   Celso Amy, PA-C  LORazepam (ATIVAN) 0.5 MG tablet Take 1 tablet (0.5 mg total) by mouth daily as needed for anxiety. 05/16/23   Sherlene Shams, MD  losartan (COZAAR) 25 MG tablet Take 1 tablet (25 mg total) by mouth daily. 02/27/24 02/26/25  Sherlene Shams, MD  meclizine (ANTIVERT)  25 MG tablet Take 1 tablet (25 mg total) by mouth 3 (three) times daily as needed for dizziness. 06/16/15   Sherlene Shams, MD  Melatonin 10 MG TABS Take 10 mg by mouth at bedtime as needed (sleep).    [provider]  metoprolol tartrate (LOPRESSOR) 25 MG tablet Take 1 tablet (25 mg total) by mouth 2 (two) times daily. 10/18/23   Loyce Dys, MD  nystatin (MYCOSTATIN/NYSTOP) powder APPLY 1 APPLICATION TO AFFECTED AREA TWICE A DAY 12/23/20   Glori Luis, MD  OnabotulinumtoxinA (BOTOX IJ) Inject as directed every 3 (three) months. For migraine Patient not taking: Reported on 10/16/2023    [provider]  ondansetron (ZOFRAN ODT) 4 MG disintegrating tablet Take 1 tablet (4 mg total) by mouth every 8 (eight) hours as needed for nausea or vomiting. Patient not taking: Reported on 10/16/2023 10/03/21   Chesley Noon, MD  polyethylene glycol powder (GLYCOLAX/MIRALAX) 17 GM/SCOOP powder Mix 1 capful in a drink once daily for Constipation. Patient not taking: Reported on 10/16/2023 05/14/23   Celso Amy, PA-C    Family History Family History  Problem Relation Age of Onset   Diabetes Mother    Hypertension Mother    Hypertension Father    Diabetes Father    Depression Other        strong hx of   Breast cancer Paternal Grandmother 35   Breast cancer Maternal Grandmother    Cancer Neg Hx     Social History Social History   Tobacco Use   Smoking status: Former    Current packs/day: 0.00    Average packs/day: 1 pack/day for 4.0 years (4.0 ttl pk-yrs)    Types: Cigarettes    Start date: 02/05/1992    Quit date: 02/05/1996  Years since quitting: 28.1   Smokeless tobacco: Never  Vaping Use   Vaping status: Never Used  Substance Use Topics   Alcohol use: No   Drug use: No     Allergies   Amoxicillin, Avocado, Citrullus vulgaris, Clarithromycin, Clarithromycin, Honey, Other, Sulfa antibiotics, Sulfa drugs cross reactors, Watermelon concentrate, Charentais melon  (french melon), Insulin detemir, Silicone, Tape, and Victoza [liraglutide]   Review of Systems Review of Systems  Genitourinary:  Positive for dysuria and frequency.     Physical Exam Triage Vital Signs ED Triage Vitals  Encounter Vitals Group     BP 03/12/24 1403 (!) 171/92     Systolic BP Percentile --      Diastolic BP Percentile --      Pulse Rate 03/12/24 1403 95     Resp 03/12/24 1403 (!) 22     Temp 03/12/24 1403 98.3 F (36.8 C)     Temp Source 03/12/24 1403 Oral     SpO2 03/12/24 1403 91 %     Weight --      Height --      Head Circumference --      Peak Flow --      Pain Score 03/12/24 1404 3     Pain Loc --      Pain Education --      Exclude from Growth Chart --    No data found.  Updated Vital Signs BP (!) 171/92 (BP Location: Left Arm)   Pulse 95   Temp 98.3 F (36.8 C) (Oral)   Resp (!) 22   LMP 05/09/2016 (LMP Unknown) Comment: D/C  SpO2 91%   Visual Acuity Right Eye Distance:   Left Eye Distance:   Bilateral Distance:    Right Eye Near:   Left Eye Near:    Bilateral Near:     Physical Exam Constitutional:      Appearance: Normal appearance.  Eyes:     Extraocular Movements: Extraocular movements intact.  Pulmonary:     Effort: Pulmonary effort is normal.  Abdominal:     General: Abdomen is flat. Bowel sounds are normal. There is no distension.     Palpations: Abdomen is soft.     Tenderness: There is no abdominal tenderness. There is right CVA tenderness. There is no left CVA tenderness or guarding.  Neurological:     Mental Status: She is alert and oriented to person, place, and time. Mental status is at baseline.      UC Treatments / Results  Labs (all labs ordered are listed, but only abnormal results are displayed) Labs Reviewed  POCT URINALYSIS DIP (MANUAL ENTRY) - Abnormal; Notable for the following components:      Result Value   Color, UA orange (*)    Blood, UA small (*)    Protein Ur, POC =30 (*)    Nitrite, UA  Positive (*)    Leukocytes, UA Small (1+) (*)    All other components within normal limits    EKG   Radiology No results found.  Procedures Procedures (including critical care time)  Medications Ordered in UC Medications - No data to display  Initial Impression / Assessment and Plan / UC Course  I have reviewed the triage vital signs and the nursing notes.  Pertinent labs & imaging results that were available during my care of the patient were reviewed by me and considered in my medical decision making (see chart for details).  Acute cystitis without hematuria  Urinalysis  positive for nitrates, sent for culture, prescribed Macrobid, has had success with in the past per patient, recommended supportive care and advised follow-up as needed, has PCP appointment on this upcoming Monday, may touch bases at that time as well Final Clinical Impressions(s) / UC Diagnoses   Final diagnoses:  Acute cystitis without hematuria     Discharge Instructions      Your urinalysis shows Jeren Dufrane blood cells and nitrates which are indicative of infection, your urine will be sent to the lab to determine exactly which bacteria is present, if any changes need to be made to your medications you will be notified  Begin use of Macrobid twice daily for 5 days  You may use over-the-counter Azo to help minimize your symptoms until antibiotic removes bacteria, this medication will turn your urine orange  Increase your fluid intake through use of water  As always practice good hygiene, wiping front to back and avoidance of scented vaginal products to prevent further irritation  If symptoms continue to persist after use of medication or recur please follow-up with urgent care or your primary doctor as needed    ED Prescriptions     Medication Sig Dispense Auth. Provider   nitrofurantoin, macrocrystal-monohydrate, (MACROBID) 100 MG capsule Take 1 capsule (100 mg total) by mouth 2 (two) times daily. 10  capsule Valinda Hoar, NP      PDMP not reviewed this encounter.   Valinda Hoar, NP 03/12/24 1407

## 2024-03-12 NOTE — ED Triage Notes (Signed)
 Patient presents to [facility] for evaluation of dysuria x a few weeks.  She was having some where her sugars were high when she was struggling with a URI a few weeks ago and symptoms improved.  She then began to have burning with urination, frequency, urgency, difficulty emptying her bladder, increase in her normal back pain, difficulty keeping her CBG up, and feeling clammy.  Says she drank a whole bottle of juice prior to coming to get her CBG above 100.

## 2024-03-13 ENCOUNTER — Other Ambulatory Visit: Payer: Self-pay

## 2024-03-13 ENCOUNTER — Emergency Department

## 2024-03-13 ENCOUNTER — Other Ambulatory Visit: Payer: Self-pay | Admitting: Internal Medicine

## 2024-03-13 ENCOUNTER — Emergency Department
Admission: EM | Admit: 2024-03-13 | Discharge: 2024-03-13 | Disposition: A | Discharge status: Home / Self Care | Attending: Emergency Medicine | Admitting: Emergency Medicine

## 2024-03-13 DIAGNOSIS — D649 Anemia, unspecified: Secondary | ICD-10-CM

## 2024-03-13 DIAGNOSIS — R0602 Shortness of breath: Secondary | ICD-10-CM | POA: Diagnosis present

## 2024-03-13 DIAGNOSIS — N1831 Chronic kidney disease, stage 3a: Secondary | ICD-10-CM

## 2024-03-13 DIAGNOSIS — E039 Hypothyroidism, unspecified: Secondary | ICD-10-CM

## 2024-03-13 DIAGNOSIS — E782 Mixed hyperlipidemia: Secondary | ICD-10-CM

## 2024-03-13 DIAGNOSIS — Z794 Long term (current) use of insulin: Secondary | ICD-10-CM

## 2024-03-13 DIAGNOSIS — E559 Vitamin D deficiency, unspecified: Secondary | ICD-10-CM

## 2024-03-13 LAB — CBC
HCT: 36.8 % (ref 36.0–46.0)
Hemoglobin: 10.4 g/dL — ABNORMAL LOW (ref 12.0–15.0)
MCH: 25.8 pg — ABNORMAL LOW (ref 26.0–34.0)
MCHC: 28.3 g/dL — ABNORMAL LOW (ref 30.0–36.0)
MCV: 91.3 fL (ref 80.0–100.0)
Platelets: 442 10*3/uL — ABNORMAL HIGH (ref 150–400)
RBC: 4.03 MIL/uL (ref 3.87–5.11)
RDW: 19.6 % — ABNORMAL HIGH (ref 11.5–15.5)
WBC: 8.2 10*3/uL (ref 4.0–10.5)
nRBC: 0.4 % — ABNORMAL HIGH (ref 0.0–0.2)

## 2024-03-13 LAB — TROPONIN I (HIGH SENSITIVITY): Troponin I (High Sensitivity): 12 ng/L (ref <18)

## 2024-03-13 LAB — BASIC METABOLIC PANEL WITH GFR
Anion gap: 7 (ref 5–15)
BUN: 24 mg/dL — ABNORMAL HIGH (ref 6–20)
CO2: 25 mmol/L (ref 22–32)
Calcium: 8.9 mg/dL (ref 8.9–10.3)
Chloride: 107 mmol/L (ref 98–111)
Creatinine, Ser: 1.15 mg/dL — ABNORMAL HIGH (ref 0.44–1.00)
GFR, Estimated: 57 mL/min — ABNORMAL LOW (ref >60)
Glucose, Bld: 137 mg/dL — ABNORMAL HIGH (ref 70–99)
Potassium: 4.7 mmol/L (ref 3.5–5.1)
Sodium: 139 mmol/L (ref 135–145)

## 2024-03-13 LAB — RESP PANEL BY RT-PCR (RSV, FLU A&B, COVID)  RVPGX2
Influenza A by PCR: NEGATIVE
Influenza B by PCR: NEGATIVE
Resp Syncytial Virus by PCR: NEGATIVE
SARS Coronavirus 2 by RT PCR: NEGATIVE

## 2024-03-13 LAB — BRAIN NATRIURETIC PEPTIDE: B Natriuretic Peptide: 45.6 pg/mL (ref 0.0–100.0)

## 2024-03-13 LAB — D-DIMER, QUANTITATIVE: D-Dimer, Quant: 0.31 ug{FEU}/mL (ref 0.00–0.50)

## 2024-03-13 MED ORDER — ALBUTEROL SULFATE HFA 108 (90 BASE) MCG/ACT IN AERS: 2.0000 | INHALATION_SPRAY | Freq: Four times a day (QID) | RESPIRATORY_TRACT | 0 refills | Status: DC | PRN

## 2024-03-13 MED ORDER — IPRATROPIUM-ALBUTEROL 0.5-2.5 (3) MG/3ML IN SOLN
3.0000 mL | Freq: Once | RESPIRATORY_TRACT | Status: AC
  Administered 2024-03-13: 3 mL via RESPIRATORY_TRACT
  Filled 2024-03-13: qty 3

## 2024-03-13 NOTE — ED Provider Notes (Signed)
 [facility] Provider Note    Event Date/Time   First MD Initiated Contact with Patient 03/13/24 2044     (approximate)   History   Shortness of Breath   HPI  Maria Bentley is a 53 y.o. female who presents to the emergency department today because of concerns for shortness of breath.  This has been gradually getting worse over the past few days.  Patient states that she has a history of anemia requiring transfusions in the past and was worried because she felt similar.  The patient denies any chest pain with this.  Denies any significant cough.  Patient denies any fevers or chills.  States that she was seen at urgent care yesterday and diagnosed with a urinary tract infection. While there she states that her oxygen level was going down into the 80s.      Physical Exam   Triage Vital Signs: ED Triage Vitals  Encounter Vitals Group     BP 03/13/24 1908 136/89     Systolic BP Percentile --      Diastolic BP Percentile --      Pulse Rate 03/13/24 1908 77     Resp 03/13/24 1908 20     Temp 03/13/24 1908 98.3 F (36.8 C)     Temp Source 03/13/24 1908 Oral     SpO2 03/13/24 1908 94 %     Weight 03/13/24 1907 230 lb (104.3 kg)     Height 03/13/24 1907 5\' 4"  (1.626 m)     Head Circumference --      Peak Flow --      Pain Score 03/13/24 1907 0     Pain Loc --      Pain Education --      Exclude from Growth Chart --     Most recent vital signs: Vitals:   03/13/24 1908  BP: 136/89  Pulse: 77  Resp: 20  Temp: 98.3 F (36.8 C)  SpO2: 94%   General: Awake, alert, oriented. CV:  Good peripheral perfusion. Regular rate and rhythm. Resp:  Normal effort. Lungs clear. Abd:  No distention.  Other:  Bilateral lower extremity pitting edema, left greater than right.    ED Results / Procedures / Treatments   Labs (all labs ordered are listed, but only abnormal results are displayed) Labs Reviewed  BASIC METABOLIC PANEL WITH GFR - Abnormal;  Notable for the following components:      Result Value   Glucose, Bld 137 (*)    BUN 24 (*)    Creatinine, Ser 1.15 (*)    GFR, Estimated 57 (*)    All other components within normal limits  CBC - Abnormal; Notable for the following components:   Hemoglobin 10.4 (*)    MCH 25.8 (*)    MCHC 28.3 (*)    RDW 19.6 (*)    Platelets 442 (*)    nRBC 0.4 (*)    All other components within normal limits  RESP PANEL BY RT-PCR (RSV, FLU A&B, COVID)  [id number]  BRAIN NATRIURETIC PEPTIDE  D-DIMER, QUANTITATIVE  TROPONIN I (HIGH SENSITIVITY)     EKG  I, Phineas Semen, attending physician, personally viewed and interpreted this EKG  EKG Time: 1910 Rate: 73 Rhythm: normal sinus rhythm Axis: left axis deviation Intervals: qtc 420 QRS: LVH ST changes: no st elevation Impression: abnormal ekg   RADIOLOGY I independently interpreted and visualized the CXR. My interpretation: No pneumonia Radiology interpretation: IMPRESSION:  No active cardiopulmonary  disease.      PROCEDURES:  Critical Care performed: No   MEDICATIONS ORDERED IN ED: Medications - No data to display   IMPRESSION / MDM / ASSESSMENT AND PLAN / ED COURSE  I reviewed the triage vital signs and the nursing notes.                              Differential diagnosis includes, but is not limited to, pneumonia, anemia, viral URI, ACS, CHF, COPD, PE  Patient's presentation is most consistent with acute presentation with potential threat to life or bodily function.   The patient is on the cardiac monitor to evaluate for evidence of arrhythmia and/or significant heart rate changes.  Patient presented to the emergency department today because of concerns for shortness of breath.  On exam patient's lungs are clear.  Patient is not hypoxic here in the emergency department.  Blood work shows hemoglobin of 10.4.  BNP normal.  Will add on troponin and D-dimer.  Will give DuoNeb treatment.  D-dimer and troponin negative.   Patient did feel better after breathing treatment.  At this time I think is reasonable for patient be discharged home.  Will give patient prescription for albuterol inhaler.      FINAL CLINICAL IMPRESSION(S) / ED DIAGNOSES   Final diagnoses:  Shortness of breath     Note:  This document was prepared using Dragon voice recognition software and may include unintentional dictation errors.    Phineas Semen, MD 03/13/24 [phone]

## 2024-03-13 NOTE — ED Triage Notes (Addendum)
 Pt reports shortness of breath xfew days and feeling lightheaded. Pt reports in the past she has been anemic when feeling like this and needed a blood transfusion. Pt states she was seen at [facility]  yesterday and dx with UTI. Pt states she has also noticed some bilateral leg swelling

## 2024-03-14 ENCOUNTER — Telehealth (HOSPITAL_COMMUNITY): Payer: Self-pay

## 2024-03-14 LAB — URINE CULTURE: Culture: >=100000 — AB

## 2024-03-14 MED ORDER — CIPROFLOXACIN HCL 500 MG PO TABS: 500.0000 mg | ORAL_TABLET | Freq: Two times a day (BID) | ORAL | 0 refills | Status: DC

## 2024-03-14 NOTE — Telephone Encounter (Signed)
 Per S. Rodriguez-Southworth, PA-C, "cipro is fine. 500 mg PO bid x 5 days." Reviewed with patient, verified pharmacy, prescription sent

## 2024-03-17 ENCOUNTER — Ambulatory Visit: Admitting: Internal Medicine

## 2024-03-17 ENCOUNTER — Encounter: Payer: Self-pay | Admitting: Internal Medicine

## 2024-03-17 VITALS — BP 128/72 | HR 84 | Ht 64.0 in | Wt 257.2 lb

## 2024-03-17 DIAGNOSIS — R0689 Other abnormalities of breathing: Secondary | ICD-10-CM

## 2024-03-17 DIAGNOSIS — R0600 Dyspnea, unspecified: Secondary | ICD-10-CM | POA: Diagnosis not present

## 2024-03-17 DIAGNOSIS — E782 Mixed hyperlipidemia: Secondary | ICD-10-CM

## 2024-03-17 DIAGNOSIS — D649 Anemia, unspecified: Secondary | ICD-10-CM

## 2024-03-17 DIAGNOSIS — E1122 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 disease: Secondary | ICD-10-CM | POA: Diagnosis not present

## 2024-03-17 DIAGNOSIS — Z794 Long term (current) use of insulin: Secondary | ICD-10-CM | POA: Diagnosis not present

## 2024-03-17 DIAGNOSIS — E559 Vitamin D deficiency, unspecified: Secondary | ICD-10-CM | POA: Diagnosis not present

## 2024-03-17 DIAGNOSIS — N1831 Chronic kidney disease, stage 3a: Secondary | ICD-10-CM | POA: Diagnosis not present

## 2024-03-17 DIAGNOSIS — E039 Hypothyroidism, unspecified: Secondary | ICD-10-CM | POA: Diagnosis not present

## 2024-03-17 DIAGNOSIS — E119 Type 2 diabetes mellitus without complications: Secondary | ICD-10-CM

## 2024-03-17 DIAGNOSIS — D508 Other iron deficiency anemias: Secondary | ICD-10-CM

## 2024-03-17 DIAGNOSIS — G4733 Obstructive sleep apnea (adult) (pediatric): Secondary | ICD-10-CM

## 2024-03-17 DIAGNOSIS — R609 Edema, unspecified: Secondary | ICD-10-CM

## 2024-03-17 MED ORDER — LEVALBUTEROL TARTRATE 45 MCG/ACT IN AERO: 1.0000 | INHALATION_SPRAY | Freq: Four times a day (QID) | RESPIRATORY_TRACT | 12 refills | Status: AC | PRN

## 2024-03-17 MED ORDER — CYANOCOBALAMIN 1000 MCG/ML IJ SOLN: 1000.0000 ug | INTRAMUSCULAR | 1 refills | Status: DC

## 2024-03-17 MED ORDER — ATORVASTATIN CALCIUM 20 MG PO TABS
20.0000 mg | ORAL_TABLET | Freq: Every day | ORAL | 1 refills | Status: DC
Start: 2024-03-17 — End: 2024-10-21

## 2024-03-17 MED ORDER — FUROSEMIDE 20 MG PO TABS: 20.0000 mg | ORAL_TABLET | Freq: Every day | ORAL | 1 refills | Status: DC

## 2024-03-17 NOTE — Patient Instructions (Addendum)
 Continue 40 units lantus  and drop the mealtime insulin g to 10 units ,  15 units if a starch meal.   PFT's ordered to evaluate the shortness of  breath  ECHO to evaluate the left ventricular function   Sending furosemide to pharmacy.  Will add potassium if your potassium is < 4.0 on today's labs   Return in one month

## 2024-03-17 NOTE — Progress Notes (Unsigned)
 Marland Kitchen

## 2024-03-17 NOTE — Progress Notes (Unsigned)
 Subjective:  Patient ID: Maria Bentley, female    DOB: Dec 11, 1971  Age: 53 y.o. MRN: [id number]  CC: The primary encounter diagnosis was Dyspnea and respiratory abnormalities. Diagnoses of Acquired hypothyroidism, Type 2 diabetes mellitus with stage 3a chronic kidney disease, with long-term current use of insulin (HCC), Stage 3a chronic kidney disease (HCC), Mixed hyperlipidemia, Vitamin D deficiency, Symptomatic anemia, Pitting edema, Type 2 diabetes mellitus without complication, with long-term current use of insulin (HCC), OSA (obstructive sleep apnea), and Other iron deficiency anemia were also pertinent to this visit.   HPI Maria Bentley presents for  Chief Complaint  Patient presents with   Medical Management of Chronic Issues    Follow up on diabetes   Has not been feeling well for several weeks;  bloating ,  fatigued,  tread fo rUTI on April 2 . Noted to be dyspneic and hypoxic during OV   Treated in ER on April 3 for dyspnea ,  BNP/D dimer, troponin,  chest x ray,  EKG  negative  for ACS/PE/CHF      RSV/flu/COVID negative . Given albuterol. For RAD  with  improvement in 02 sats   History of normal  ECHO in 2020 during hospitalization for [facility]DRF  WEIGHT GAIN OF 25 LBS SINCE NOVEMBER.  FEELS SHE HAS FLUID RETENTION.   BUT ALSO HAS BEEN DOING A LOT OF NIGHTTIME SNACKING  ON COOKIES   DIABETES : she is using a CBG monitor and taking 20 UNITS OF NOVOLOG PRE MEAL TID UNLESS  the meal is ultr low carb  GLUCERNA,  INCREASES TO 25 FOR STARCHY MEAL. Lantus 40 units at bedtime.  Having recurrent lows during the night and during the day after initial elevations   Lab Results  Component Value Date   HGBA1C 6.7 (H) 10/16/2023       Outpatient Medications Prior to Visit  Medication Sig Dispense Refill   acetaminophen (TYLENOL) 325 MG tablet Take 2 tablets (650 mg total) by mouth every 6 (six) hours as needed for mild pain (pain score 1-3) (or Fever >/= 101). 20 tablet 0    albuterol (VENTOLIN HFA) 108 (90 Base) MCG/ACT inhaler Inhale 2 puffs into the lungs every 6 (six) hours as needed for wheezing or shortness of breath. 18 g 0   albuterol (VENTOLIN HFA) 108 (90 Base) MCG/ACT inhaler Inhale 2 puffs into the lungs every 6 (six) hours as needed for wheezing or shortness of breath. 8 g 0   amitriptyline (ELAVIL) 50 MG tablet TAKE 1 TABLET BY MOUTH EVERYDAY AT BEDTIME (Patient taking differently: Take 50 mg by mouth at bedtime.) 90 tablet 3   aspirin-acetaminophen-caffeine (EXCEDRIN MIGRAINE) 250-250-65 MG tablet Take 2 tablets by mouth every 6 (six) hours as needed for headache.     Azelastine HCl 137 MCG/SPRAY SOLN      BD INTEGRA SYRINGE 25G X 1" 3 ML MISC FOR USE WITH B12 INJECTIONS WEEKLY/MONTHLY 12 each 2   buPROPion (WELLBUTRIN XL) 150 MG 24 hr tablet Take 450 mg by mouth daily.   3   calcium carbonate (OSCAL) 1500 (600 Ca) MG TABS tablet Take 600 mg of elemental calcium by mouth daily with breakfast.     cholecalciferol (VITAMIN D3) 25 MCG (1000 UNIT) tablet Take 5,000 Units by mouth daily.     ciprofloxacin (CIPRO) 500 MG tablet Take 1 tablet (500 mg total) by mouth every 12 (twelve) hours. 10 tablet 0   CLINPRO 5000 1.1 % PSTE Place 1 application onto teeth  at bedtime.      Continuous Glucose Sensor (DEXCOM G7 SENSOR) MISC APPLY 1 SENSOR EVERY 10 DAYS 3 each 1   cyclobenzaprine (FLEXERIL) 10 MG tablet Take 0.5-1 tablets (5-10 mg total) by mouth 3 (three) times daily as needed for muscle spasms. 30 tablet 12   diphenhydrAMINE (BENADRYL) 25 MG tablet Take 25 mg by mouth every 6 (six) hours as needed for allergies.     EPINEPHrine 0.3 mg/0.3 mL IJ SOAJ injection Inject 0.3 mg into the muscle as needed for anaphylaxis.      escitalopram (LEXAPRO) 20 MG tablet Take 20 mg by mouth daily.      ferrous sulfate 325 (65 FE) MG EC tablet Take 1 tablet (325 mg total) by mouth 2 (two) times daily. 60 tablet 3   fluticasone (FLONASE) 50 MCG/ACT nasal spray SPRAY 2 SPRAYS  INTO EACH NOSTRIL EVERY DAY 48 mL 1   HORIZANT 600 MG TBCR TAKE 1 TABLET BY MOUTH TWICE A DAY 60 tablet 5   insulin aspart (NOVOLOG FLEXPEN) 100 UNIT/ML FlexPen Inject 20 Units into the skin 3 (three) times daily with meals. INJECT 20 UNITS INTO THE SKIN 3 (THREE) TIMES DAILY WITH MEALS. 30 mL 1   insulin glargine (LANTUS SOLOSTAR) 100 UNIT/ML Solostar Pen Inject 45 Units into the skin daily. (Patient taking differently: Inject 40 Units into the skin daily.) 15 mL PRN   Insulin Pen Needle (BD PEN NEEDLE NANO U/F) 32G X 4 MM MISC Please specify directions, refills and quantity 100 each 11   lamoTRIgine (LAMICTAL) 100 MG tablet Take 100 mg by mouth 2 (two) times daily.     levothyroxine (SYNTHROID) 50 MCG tablet TAKE 1 TABLET BY MOUTH EVERY DAY 90 tablet 1   lipase/protease/amylase (CREON) 36000 UNITS CPEP capsule TAKE 2 CAPSULES BY MOUTH 3 (THREE) TIMES DAILY BEFORE MEALS AND 1 CAPSULE WITH SNACKS. 300 capsule 12   LORazepam (ATIVAN) 0.5 MG tablet Take 1 tablet (0.5 mg total) by mouth daily as needed for anxiety. 30 tablet 0   losartan (COZAAR) 25 MG tablet Take 1 tablet (25 mg total) by mouth daily. 90 tablet 1   meclizine (ANTIVERT) 25 MG tablet Take 1 tablet (25 mg total) by mouth 3 (three) times daily as needed for dizziness. 90 tablet 6   Melatonin 10 MG TABS Take 10 mg by mouth at bedtime as needed (sleep).     metoprolol tartrate (LOPRESSOR) 25 MG tablet Take 1 tablet (25 mg total) by mouth 2 (two) times daily. 60 tablet 0   nitrofurantoin, macrocrystal-monohydrate, (MACROBID) 100 MG capsule Take 1 capsule (100 mg total) by mouth 2 (two) times daily. 10 capsule 0   nystatin (MYCOSTATIN/NYSTOP) powder APPLY 1 APPLICATION TO AFFECTED AREA TWICE A DAY 60 g 0   OnabotulinumtoxinA (BOTOX IJ) Inject as directed every 3 (three) months. For migraine     ondansetron (ZOFRAN ODT) 4 MG disintegrating tablet Take 1 tablet (4 mg total) by mouth every 8 (eight) hours as needed for nausea or vomiting. 12  tablet 0   polyethylene glycol powder (GLYCOLAX/MIRALAX) 17 GM/SCOOP powder Mix 1 capful in a drink once daily for Constipation. 255 g 3   atorvastatin (LIPITOR) 20 MG tablet Take 1 tablet (20 mg total) by mouth daily. 90 tablet 1   cyanocobalamin (VITAMIN B12) 1000 MCG/ML injection Inject 1 mL (1,000 mcg total) into the muscle every 30 (thirty) days. Inject 1 mL intramuscularly once a week for 4 weeks and then once a month thereafter. 10 mL  1   furosemide (LASIX) 20 MG tablet TAKE 1 TABLET (20 MG TOTAL) BY MOUTH DAILY. AS NEEDED FOR FLUID RETENTION 90 tablet 1   Continuous Glucose Sensor (FREESTYLE LIBRE 3 PLUS SENSOR) MISC Change sensor every 15 days. (Patient not taking: Reported on 03/17/2024) 2 each 2   Continuous Glucose Sensor (FREESTYLE LIBRE 3 SENSOR) MISC PLACE 1 SENSOR ON THE SKIN EVERY 14 DAYS. USE TO CHECK GLUCOSE CONTINUOUSLY (Patient not taking: Reported on 03/17/2024) 2 each 5   No facility-administered medications prior to visit.    Review of Systems;  Patient denies headache, fevers, malaise, unintentional weight loss, skin rash, eye pain, sinus congestion and sinus pain, sore throat, dysphagia,  hemoptysis , cough, dyspnea, wheezing, chest pain, palpitations, orthopnea, edema, abdominal pain, nausea, melena, diarrhea, constipation, flank pain, dysuria, hematuria, urinary  Frequency, nocturia, numbness, tingling, seizures,  Focal weakness, Loss of consciousness,  Tremor, insomnia, depression, anxiety, and suicidal ideation.      Objective:  BP 128/72   Pulse 84   Ht 5\' 4"  (1.626 m)   Wt 257 lb 3.2 oz (116.7 kg)   LMP 05/09/2016 (LMP Unknown) Comment: D/C  SpO2 94%   BMI 44.15 kg/m   BP Readings from Last 3 Encounters:  03/17/24 128/72  03/13/24 139/60  03/12/24 (!) 171/92    Wt Readings from Last 3 Encounters:  03/17/24 257 lb 3.2 oz (116.7 kg)  03/13/24 230 lb (104.3 kg)  10/16/23 232 lb (105.2 kg)    Physical Exam Vitals reviewed.  Constitutional:       General: She is not in acute distress.    Appearance: Normal appearance. She is obese. She is not ill-appearing, toxic-appearing or diaphoretic.  HENT:     Head: Normocephalic.  Eyes:     General: No scleral icterus.       Right eye: No discharge.        Left eye: No discharge.     Conjunctiva/sclera: Conjunctivae normal.  Cardiovascular:     Rate and Rhythm: Normal rate and regular rhythm.     Heart sounds: Normal heart sounds.  Pulmonary:     Effort: Pulmonary effort is normal. No respiratory distress.     Breath sounds: Normal breath sounds.  Musculoskeletal:        General: Normal range of motion.  Skin:    General: Skin is warm and dry.  Neurological:     General: No focal deficit present.     Mental Status: She is alert and oriented to person, place, and time. Mental status is at baseline.  Psychiatric:        Mood and Affect: Mood normal.        Behavior: Behavior normal.        Thought Content: Thought content normal.        Judgment: Judgment normal.     Lab Results  Component Value Date   HGBA1C 6.7 (H) 10/16/2023   HGBA1C 7.2 (A) 10/15/2023   HGBA1C 9.5 (A) 05/16/2023    Lab Results  Component Value Date   CREATININE 1.15 (H) 03/13/2024   CREATININE 1.14 (H) 10/17/2023   CREATININE 1.05 (H) 10/16/2023    Lab Results  Component Value Date   WBC 8.2 03/13/2024   HGB 10.4 (L) 03/13/2024   HCT 36.8 03/13/2024   PLT 442 (H) 03/13/2024   GLUCOSE 137 (H) 03/13/2024   CHOL 120 03/17/2024   TRIG 111 03/17/2024   HDL 40 (L) 03/17/2024   LDLDIRECT 53.0 10/15/2023  LDLCALC 61 03/17/2024   ALT 15 10/15/2023   AST 15 10/15/2023   NA 139 03/13/2024   K 4.7 03/13/2024   CL 107 03/13/2024   CREATININE 1.15 (H) 03/13/2024   BUN 24 (H) 03/13/2024   CO2 25 03/13/2024   TSH 1.46 05/16/2023   INR 0.9 10/25/2019   HGBA1C 6.7 (H) 10/16/2023   MICROALBUR 2.3 (H) 05/16/2023    DG Chest 2 View Result Date: 03/13/2024 CLINICAL DATA:  Shortness of breath EXAM:  CHEST - 2 VIEW COMPARISON:  Chest x-ray 10/15/2023 FINDINGS: The heart size and mediastinal contours are within normal limits. Both lungs are clear. The visualized skeletal structures are unremarkable. IMPRESSION: No active cardiopulmonary disease. Electronically Signed   By: Darliss Cheney M.D.   On: 03/13/2024 19:24    Assessment & Plan:  .Dyspnea and respiratory abnormalities -     Levalbuterol Tartrate; Inhale 1-2 puffs into the lungs every 6 (six) hours as needed for wheezing.  Dispense: 15 g; Refill: 12 -     Pulmonary Function Test [facility] Only; Future -     ECHOCARDIOGRAM COMPLETE; Future  Acquired hypothyroidism -     TSH  Type 2 diabetes mellitus with stage 3a chronic kidney disease, with long-term current use of insulin (HCC) Assessment & Plan: Improved control with use of CBG for frequent monitoring. Recommend reducing her mealtime insulin and continuining 40 units of lantus .  Continue A$B and statin .  Consider SGLT 2 inhibitor if uacr is > 30   Lab Results  Component Value Date   HGBA1C 6.7 (H) 10/16/2023   Lab Results  Component Value Date   MICROALBUR 2.3 (H) 05/16/2023   MICROALBUR 1.1 11/28/2021       Orders: -     Microalbumin / creatinine urine ratio -     Hemoglobin A1c  Stage 3a chronic kidney disease (HCC) Assessment & Plan: She remains anemic with normal iron stores.  She is above the threshold for aranesp.  Refer to nephrology  Lab Results  Component Value Date   CREATININE 1.15 (H) 03/13/2024     Orders: -     PTH, intact and calcium -     Comprehensive metabolic panel with GFR  Mixed hyperlipidemia -     Lipid Panel w/reflex Direct LDL -     Atorvastatin Calcium; Take 1 tablet (20 mg total) by mouth daily.  Dispense: 90 tablet; Refill: 1  Vitamin D deficiency -     VITAMIN D 25 Hydroxy (Vit-D Deficiency, Fractures)  Symptomatic anemia -     B12 and Folate Panel -     Iron, TIBC and Ferritin Panel -     CBC with Differential/Platelet -      Cyanocobalamin; Inject 1 mL (1,000 mcg total) into the muscle every 30 (thirty) days. Inject 1 mL intramuscularly once a week for 4 weeks and then once a month thereafter.  Dispense: 10 mL; Refill: 1  Pitting edema -     ECHOCARDIOGRAM COMPLETE; Future  Type 2 diabetes mellitus without complication, with long-term current use of insulin (HCC) Assessment & Plan: Reviewed CBG monitor for the last 2 weeks.  Advised to continue 40 units of lantus and reduce the mealtime insulin doses to 10-15 units rtc 3 months. Repeat A1c pending   Lab Results  Component Value Date   HGBA1C 6.7 (H) 10/16/2023      OSA (obstructive sleep apnea) Assessment & Plan: diagnosed with prior sleep study but treatment has been deferred  by patient due to patient intolerance of all masks. .  Discussed the long term history of OSA , the risks of long term damage to heart and the signs and symptoms attributable to OSA.  Advised patient to consider  significant weight loss and/or use of CPAP.  She is now having significant fluid retention.  Furosemide , PFTs and ECHO ordered    Other iron deficiency anemia Assessment & Plan: Current iron stores are normal but she remains anemic   Lab Results  Component Value Date   VITAMINB12 >7,500 (H) 10/16/2023     Lab Results  Component Value Date   IRON 110 03/17/2024   TIBC 403 03/17/2024   FERRITIN 10 (L) 03/17/2024    Lab Results  Component Value Date   WBC 8.2 03/13/2024   HGB 10.4 (L) 03/13/2024   HCT 36.8 03/13/2024   MCV 91.3 03/13/2024   PLT 442 (H) 03/13/2024      Other orders -     Furosemide; Take 1 tablet (20 mg total) by mouth daily. As needed for fluid retention  Dispense: 90 tablet; Refill: 1     I spent 40 minutes on the day of this face to face encounter reviewing patient's  most recent  ER visit , last sleep study, last hospitalization including  ECHO and follow up with  cardiology,   and neurology,  prior relevant surgical and non  surgical procedures, recent  labs and imaging studies, counseling on weight management,  reviewing the assessment and plan with patient, and post visit ordering and reviewing of  diagnostics and therapeutics with patient  .   Follow-up: Return in about 4 weeks (around 04/14/2024).   Sherlene Shams, MD

## 2024-03-18 LAB — COMPREHENSIVE METABOLIC PANEL WITH GFR
ALT: 29 U/L (ref 0–35)
AST: 22 U/L (ref 0–37)
Albumin: 3.2 g/dL — ABNORMAL LOW (ref 3.5–5.2)
Alkaline Phosphatase: 109 U/L (ref 39–117)
BUN: 20 mg/dL (ref 6–23)
CO2: 26 meq/L (ref 19–32)
Calcium: 8.7 mg/dL (ref 8.4–10.5)
Chloride: 108 meq/L (ref 96–112)
Creatinine, Ser: 1.26 mg/dL — ABNORMAL HIGH (ref 0.40–1.20)
GFR: 48.69 mL/min — ABNORMAL LOW (ref >60.00)
Glucose, Bld: 115 mg/dL — ABNORMAL HIGH (ref 70–99)
Potassium: 4.8 meq/L (ref 3.5–5.1)
Sodium: 142 meq/L (ref 135–145)
Total Bilirubin: 0.4 mg/dL (ref 0.2–1.2)
Total Protein: 6.2 g/dL (ref 6.0–8.3)

## 2024-03-18 LAB — LIPID PANEL W/REFLEX DIRECT LDL
Cholesterol: 120 mg/dL (ref <200)
HDL: 40 mg/dL — ABNORMAL LOW (ref >=50)
LDL Cholesterol (Calc): 61 mg/dL
Non-HDL Cholesterol (Calc): 80 mg/dL (ref <130)
Total CHOL/HDL Ratio: 3 (calc) (ref <5.0)
Triglycerides: 111 mg/dL (ref <150)

## 2024-03-18 LAB — MICROALBUMIN / CREATININE URINE RATIO
Creatinine,U: 62.6 mg/dL
Microalb Creat Ratio: 28 mg/g (ref 0.0–30.0)
Microalb, Ur: 1.8 mg/dL (ref 0.0–1.9)

## 2024-03-18 LAB — VITAMIN D 25 HYDROXY (VIT D DEFICIENCY, FRACTURES): VITD: 49.81 ng/mL (ref 30.00–100.00)

## 2024-03-18 LAB — B12 AND FOLATE PANEL
Folate: 21 ng/mL (ref >5.9)
Vitamin B-12: 1484 pg/mL — ABNORMAL HIGH (ref 211–911)

## 2024-03-18 LAB — CBC WITH DIFFERENTIAL/PLATELET
Basophils Absolute: 0.1 10*3/uL (ref 0.0–0.1)
Basophils Relative: 0.8 % (ref 0.0–3.0)
Eosinophils Absolute: 0.1 10*3/uL (ref 0.0–0.7)
Eosinophils Relative: 1.5 % (ref 0.0–5.0)
HCT: 35.3 % — ABNORMAL LOW (ref 36.0–46.0)
Hemoglobin: 10.6 g/dL — ABNORMAL LOW (ref 12.0–15.0)
Lymphocytes Relative: 22.9 % (ref 12.0–46.0)
Lymphs Abs: 2.1 10*3/uL (ref 0.7–4.0)
MCHC: 29.9 g/dL — ABNORMAL LOW (ref 30.0–36.0)
MCV: 89.8 fl (ref 78.0–100.0)
Monocytes Absolute: 0.5 10*3/uL (ref 0.1–1.0)
Monocytes Relative: 5.5 % (ref 3.0–12.0)
Neutro Abs: 6.5 10*3/uL (ref 1.4–7.7)
Neutrophils Relative %: 69.3 % (ref 43.0–77.0)
Platelets: 534 10*3/uL — ABNORMAL HIGH (ref 150.0–400.0)
RBC: 3.93 Mil/uL (ref 3.87–5.11)
RDW: 20 % — ABNORMAL HIGH (ref 11.5–15.5)
WBC: 9.4 10*3/uL (ref 4.0–10.5)

## 2024-03-18 LAB — TSH: TSH: 1.55 u[IU]/mL (ref 0.35–5.50)

## 2024-03-18 LAB — IRON,TIBC AND FERRITIN PANEL
%SAT: 27 % (ref 16–45)
Ferritin: 10 ng/mL — ABNORMAL LOW (ref 16–232)
Iron: 110 ug/dL (ref 45–160)
TIBC: 403 ug/dL (ref 250–450)

## 2024-03-18 LAB — PTH, INTACT AND CALCIUM
Calcium: 8.8 mg/dL (ref 8.6–10.4)
PTH: 106 pg/mL — ABNORMAL HIGH (ref 16–77)

## 2024-03-18 LAB — HEMOGLOBIN A1C: Hgb A1c MFr Bld: 7.9 % — ABNORMAL HIGH (ref 4.6–6.5)

## 2024-03-18 NOTE — Telephone Encounter (Signed)
 It does look like pt is due for the shingles vaccine. At what age do pt start having the bone density scan done?   As for the Metoprolol pt stated that she has increased her Metoprolol to 1 tablet BID instead of the 1/2 tablet BID and it seems to be working better for her. Is it okay to send in a new rx for 1 tablet BID?

## 2024-03-18 NOTE — Assessment & Plan Note (Signed)
 diagnosed with prior sleep study but treatment has been deferred  by patient due to patient intolerance of all masks. .  Discussed the long term history of OSA , the risks of long term damage to heart and the signs and symptoms attributable to OSA.  Advised patient to consider  significant weight loss and/or use of CPAP.  She is now having significant fluid retention.  Furosemide , PFTs and ECHO ordered

## 2024-03-18 NOTE — Assessment & Plan Note (Signed)
 Current iron stores are normal but she remains anemic   Lab Results  Component Value Date   VITAMINB12 >7,500 (H) 10/16/2023     Lab Results  Component Value Date   IRON 110 03/17/2024   TIBC 403 03/17/2024   FERRITIN 10 (L) 03/17/2024    Lab Results  Component Value Date   WBC 8.2 03/13/2024   HGB 10.4 (L) 03/13/2024   HCT 36.8 03/13/2024   MCV 91.3 03/13/2024   PLT 442 (H) 03/13/2024

## 2024-03-18 NOTE — Assessment & Plan Note (Addendum)
 Improved control with use of CBG for frequent monitoring. Recommend reducing her mealtime insulin and continuining 40 units of lantus .  Continue A$B and statin .  Consider SGLT 2 inhibitor if uacr is > 30 . Has not seen nephrology in > 2 years   Lab Results  Component Value Date   HGBA1C 6.7 (H) 10/16/2023   Lab Results  Component Value Date   MICROALBUR 2.3 (H) 05/16/2023   MICROALBUR 1.1 11/28/2021

## 2024-03-18 NOTE — Assessment & Plan Note (Signed)
 Reviewed CBG monitor for the last 2 weeks.  Advised to continue 40 units of lantus and reduce the mealtime insulin doses to 10-15 units rtc 3 months. Repeat A1c pending   Lab Results  Component Value Date   HGBA1C 6.7 (H) 10/16/2023

## 2024-03-18 NOTE — Assessment & Plan Note (Signed)
 She remains anemic with normal iron stores.  She is above the threshold for aranesp.  Refer to nephrology  Lab Results  Component Value Date   CREATININE 1.15 (H) 03/13/2024

## 2024-03-19 ENCOUNTER — Encounter: Payer: Self-pay | Admitting: Internal Medicine

## 2024-03-19 DIAGNOSIS — D75839 Thrombocytosis, unspecified: Secondary | ICD-10-CM | POA: Insufficient documentation

## 2024-03-20 MED ORDER — METOPROLOL TARTRATE 25 MG PO TABS: 25.0000 mg | ORAL_TABLET | Freq: Two times a day (BID) | ORAL | 1 refills | Status: DC

## 2024-03-20 NOTE — Addendum Note (Signed)
 Addended by: Sandy Salaam on: 03/20/2024 04:00 PM   Modules accepted: Orders

## 2024-03-21 ENCOUNTER — Encounter: Payer: Self-pay | Admitting: Internal Medicine

## 2024-04-02 ENCOUNTER — Ambulatory Visit: Payer: Self-pay | Admitting: Internal Medicine

## 2024-04-08 ENCOUNTER — Other Ambulatory Visit: Payer: Self-pay | Admitting: Internal Medicine

## 2024-04-08 DIAGNOSIS — D649 Anemia, unspecified: Secondary | ICD-10-CM

## 2024-04-26 ENCOUNTER — Other Ambulatory Visit: Payer: Self-pay | Admitting: Internal Medicine

## 2024-04-28 ENCOUNTER — Ambulatory Visit: Admitting: Internal Medicine

## 2024-05-06 LAB — HM DIABETES EYE EXAM

## 2024-06-01 ENCOUNTER — Other Ambulatory Visit: Payer: Self-pay | Admitting: Internal Medicine

## 2024-06-02 ENCOUNTER — Ambulatory Visit
Admission: RE | Admit: 2024-06-02 | Discharge: 2024-06-02 | Disposition: A | Discharge status: Home / Self Care | Source: Ambulatory Visit | Attending: Emergency Medicine | Admitting: Emergency Medicine

## 2024-06-02 VITALS — BP 137/89 | HR 85 | Temp 98.7°F | Resp 18

## 2024-06-02 DIAGNOSIS — N390 Urinary tract infection, site not specified: Secondary | ICD-10-CM | POA: Insufficient documentation

## 2024-06-02 LAB — POCT URINALYSIS DIP (MANUAL ENTRY)
Blood, UA: NEGATIVE
Glucose, UA: 100 mg/dL — AB
Nitrite, UA: POSITIVE — AB
Protein Ur, POC: 100 mg/dL — AB
Spec Grav, UA: 1.01 (ref 1.010–1.025)
Urobilinogen, UA: 2 U/dL — AB
pH, UA: 5 (ref 5.0–8.0)

## 2024-06-02 MED ORDER — CEPHALEXIN 500 MG PO CAPS: 500.0000 mg | ORAL_CAPSULE | Freq: Three times a day (TID) | ORAL | 0 refills | Status: AC

## 2024-06-02 NOTE — Discharge Instructions (Addendum)
Take the antibiotic as directed.  The urine culture is pending.  We will call you if it shows the need to change or discontinue your antibiotic.    Follow up with your primary care provider tomorrow.  Go to the emergency department if you have worsening symptoms.

## 2024-06-02 NOTE — ED Triage Notes (Addendum)
 Patient to Urgent Care with complaints of dysuria/ itching/ urinary frequency/ lower abdominal pressure/ difficulty urinating.   Symptoms started worsening three days ago (reports several weeks of symptoms).  Taking pyridium .

## 2024-06-02 NOTE — ED Provider Notes (Signed)
 Maria Bentley    CSN: [id number] Arrival date & time: 06/02/24  1745      History   Chief Complaint Chief Complaint  Patient presents with   URI    Burning, frequency, unpleasant feeling in lower abdomen, urine retention - Entered by patient    HPI Maria Bentley is a 53 y.o. female.  Patient presents with dysuria, urinary frequency, urinary hesitancy, bladder pressure for several weeks but worse x 3 days..  She has been treating her symptoms with Pyridium .  No fever, chills, hematuria, flank pain, abdominal pain, vaginal discharge, pelvic pain.  The history is provided by the patient and medical records.    Past Medical History:  Diagnosis Date   Abscess of right buttock 04/30/2019   Anemia    Anxiety    Arthritis    Cataract    CKD (chronic kidney disease), stage III (HCC)    COVID-19 virus infection    Depression 2010   Diabetes mellitus    DKA, type 2 (HCC) 10/13/2019   Dysrhythmia    Endometriosis    Family history of adverse reaction to anesthesia    Mother - heart stopped during several procedures using propofol    Fibromyalgia    Headache, common migraine, intractable    History of kidney stones    History of ovarian cyst    left sided, caused by her endometriosis (possibly an endometrioma), in her 86s.   Hyperlipidemia    Hypertension    Hypothyroidism    Kidney stones    left   Morbid obesity (HCC)    s/p gastric bypass   Motion sickness    long car rides   Personal hx of gastric bypass 2010   Rosacea    Vertigo     Patient Active Problem List   Diagnosis Date Noted   Thrombocytosis 03/19/2024   Dyslipidemia 10/16/2023   Type 2 diabetes mellitus without complication, with long-term current use of insulin  (HCC) 10/16/2023   Essential hypertension 10/16/2023   Hypothyroidism 10/16/2023   Symptomatic anemia 10/16/2023   GI bleeding 10/15/2023   Polyp of sigmoid colon 06/06/2023   Colon cancer screening 06/06/2023   B12  deficiency 05/18/2023   Knee pain 12/23/2022   Other symptoms and signs involving cognitive functions and awareness 09/14/2022   Radiculitis, cervical 11/28/2021   COVID-19 long hauler manifesting chronic fatigue 11/28/2021   History of UTI 11/28/2021   Polyp of transverse colon    COVID-19 long hauler manifesting chronic concentration deficit 07/09/2021   Encounter for screening colonoscopy    Pancreatic insufficiency 09/04/2020   Diarrhea 09/04/2020   Hematuria 08/25/2020   Stage 3a chronic kidney disease (HCC) 08/25/2020   Analgesic overuse headache 08/24/2020   Atypical facial pain 08/24/2020   Bruxism (teeth grinding) 08/24/2020   Cervical syndrome 08/24/2020   Disorder of autonomic nervous system 08/24/2020   Fibromyositis 08/24/2020   Temporomandibular joint disorder 08/24/2020   Hypoglycemic reaction 08/21/2020   Coronary atherosclerosis due to calcified coronary lesion 08/21/2020   Renal mass, left 07/06/2020   Chronic right shoulder pain 02/09/2020   Recurrent headache 01/13/2020   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 12/30/2019   Elevated alkaline phosphatase level 11/21/2019   Hospital discharge follow-up 11/04/2019   Cervical spondylosis without myelopathy 10/29/2019   Pressure injury of skin 10/16/2019   History of gastric bypass 10/13/2019   COVID-19 virus infection 10/06/2019   Preoperative evaluation of a medical condition to rule out surgical contraindications (TAR required)  09/21/2019   Morbid obesity with BMI of 40.0-44.9, adult (HCC) 06/24/2019   History of bariatric surgery 04/15/2019   Depressive disorder 11/12/2018   Fibromyalgia 06/05/2018   Iron  deficiency anemia 06/05/2018   Hypertension 11/24/2017   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 disease (HCC) 05/15/2017   Cervico-occipital neuralgia 05/02/2017   Low back pain 02/02/2017   Hip pain, chronic 12/28/2015   Pain in joint, ankle and foot 12/28/2015   Insomnia due  to psychological stress 09/09/2015   Nonadherence to medical treatment 06/08/2015   Right hip pain 05/30/2014   Vitamin D  deficiency 05/30/2014   Tachycardia 08/15/2013   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/2013   Sinusitis, acute 03/20/2013   Morbid obesity (HCC) 06/09/2012   Anxiety    Headache, common migraine, intractable    Hyperlipidemia    Hypothyroid    OSA (obstructive sleep apnea) 02/05/2012    Past Surgical History:  Procedure Laterality Date   CARPAL TUNNEL RELEASE Right 11/26/2020   Procedure: CARPAL TUNNEL RELEASE;  Surgeon: Kathlynn Sharper, MD;  Location: [facility];  Service: Orthopedics;  Laterality: Right;   CATARACT EXTRACTION W/PHACO Right 01/01/2023   Procedure: CATARACT EXTRACTION PHACO AND INTRAOCULAR LENS PLACEMENT (IOC) RIGHT DIABETIC;  Surgeon: Myrna Adine Anes, MD;  Location: [facility];  Service: Ophthalmology;  Laterality: Right;  10.12 053.5   CATARACT EXTRACTION W/PHACO Left 01/15/2023   Procedure: CATARACT EXTRACTION PHACO AND INTRAOCULAR LENS PLACEMENT (IOC) LEFT DIABETIC 8.99 00:49.5;  Surgeon: Myrna Adine Anes, MD;  Location: [facility];  Service: Ophthalmology;  Laterality: Left;  Diabetic   COLONOSCOPY WITH PROPOFOL  N/A 10/21/2020   Procedure: COLONOSCOPY WITH PROPOFOL ;  Surgeon: Janalyn Keene NOVAK, MD;  Location: [facility];  Service: Endoscopy;  Laterality: N/A;   COLONOSCOPY WITH PROPOFOL  N/A 09/15/2021   Procedure: COLONOSCOPY WITH PROPOFOL ;  Surgeon: Janalyn Keene NOVAK, MD;  Location: [facility];  Service: Endoscopy;  Laterality: N/A;   COLONOSCOPY WITH PROPOFOL  N/A 06/06/2023   Procedure: COLONOSCOPY WITH PROPOFOL ;  Surgeon: Jinny Carmine, MD;  Location: [facility];  Service: Endoscopy;  Laterality: N/A;   DILATION AND CURETTAGE OF UTERUS  12/11/2009   ESOPHAGOGASTRODUODENOSCOPY (EGD) WITH PROPOFOL  N/A 10/17/2023   Procedure: ESOPHAGOGASTRODUODENOSCOPY (EGD) WITH PROPOFOL ;  Surgeon: Jinny Carmine, MD;   Location: [facility];  Service: Endoscopy;  Laterality: N/A;   EYE SURGERY     FOREIGN BODY REMOVAL  10/17/2023   Procedure: FOREIGN BODY REMOVAL;  Surgeon: Jinny Carmine, MD;  Location: [facility];  Service: Endoscopy;;   gastric bypass     OPEN REDUCTION INTERNAL FIXATION (ORIF) DISTAL RADIAL FRACTURE Right 11/26/2020   Procedure: Open reduction internal fixation, right distal radius;  Surgeon: Kathlynn Sharper, MD;  Location: [facility];  Service: Orthopedics;  Laterality: Right;   OVARIAN CYST REMOVAL     POLYPECTOMY  06/06/2023   Procedure: POLYPECTOMY;  Surgeon: Jinny Carmine, MD;  Location: [facility];  Service: Endoscopy;;   ROTATOR CUFF REPAIR     right   SHOULDER ARTHROSCOPY WITH SUBACROMIAL DECOMPRESSION AND OPEN ROTATOR C Right 07/19/2020   Procedure: Right shoulder arthroscopic vs mini-open rotator cuff repair vs Regeneten patch application, subacomial dcompression, and biceps tenodesis;  Surgeon: Tobie Priest, MD;  Location: [facility];  Service: Orthopedics;  Laterality: Right;    OB History     Gravida  0   Para  0   Term  0   Preterm  0   AB  0   Living  0  SAB  0   IAB  0   Ectopic  0   Multiple  0   Live Births  0            Home Medications    Prior to Admission medications   Medication Sig Start Date End Date Taking? Authorizing Provider  cephALEXin  (KEFLEX ) 500 MG capsule Take 1 capsule (500 mg total) by mouth 3 (three) times daily for 5 days. 06/02/24 06/07/24 Yes Corlis Burnard DEL, NP  acetaminophen  (TYLENOL ) 325 MG tablet Take 2 tablets (650 mg total) by mouth every 6 (six) hours as needed for mild pain (pain score 1-3) (or Fever >/= 101). 10/18/23   Dorinda Drue DASEN, MD  albuterol  (VENTOLIN  HFA) 108 (90 Base) MCG/ACT inhaler Inhale 2 puffs into the lungs every 6 (six) hours as needed for wheezing or shortness of breath. 10/02/19   Joshua Clayborne RAMAN, PA-C  albuterol  (VENTOLIN  HFA) 108 (90 Base) MCG/ACT inhaler Inhale 2 puffs into the lungs  every 6 (six) hours as needed for wheezing or shortness of breath. 03/13/24   Floy Roberts, MD  amitriptyline  (ELAVIL ) 50 MG tablet TAKE 1 TABLET BY MOUTH EVERYDAY AT BEDTIME Patient taking differently: Take 50 mg by mouth at bedtime. 09/10/23   Marylynn Verneita CROME, MD  aspirin -acetaminophen -caffeine  (EXCEDRIN  MIGRAINE) 250-250-65 MG tablet Take 2 tablets by mouth every 6 (six) hours as needed for headache.    [provider]  atorvastatin  (LIPITOR) 20 MG tablet Take 1 tablet (20 mg total) by mouth daily. 03/17/24   Marylynn Verneita CROME, MD  Azelastine  HCl 137 MCG/SPRAY SOLN  11/22/21   [provider]  BD INTEGRA SYRINGE 25G X 1 3 ML MISC FOR USE WITH B12 INJECTIONS WEEKLY/MONTHLY 11/30/23   Marylynn Verneita CROME, MD  buPROPion  (WELLBUTRIN  XL) 150 MG 24 hr tablet Take 450 mg by mouth daily.  05/12/18   [provider]  calcium  carbonate (OSCAL) 1500 (600 Ca) MG TABS tablet Take 600 mg of elemental calcium  by mouth daily with breakfast.    [provider]  cholecalciferol (VITAMIN D3) 25 MCG (1000 UNIT) tablet Take 5,000 Units by mouth daily.    [provider]  CLINPRO 5000 1.1 % PSTE Place 1 application onto teeth at bedtime.  06/15/20   [provider]  Continuous Glucose Sensor (DEXCOM G7 SENSOR) MISC APPLY 1 SENSOR EVERY 10 DAYS 04/28/24   Marylynn Verneita CROME, MD  cyanocobalamin  (VITAMIN B12) 1000 MCG/ML injection INJECT 1 ML INTO THE MUSCLE EVERY 30 DAYS. INJECT 1 ML INTRAMUSCULARLY ONCE A WEEK FOR 4 WEEKS AND THEN ONCE A MONTH THEREAFTER. 04/11/24   Marylynn Verneita CROME, MD  cyclobenzaprine  (FLEXERIL ) 10 MG tablet TAKE 0.5-1 TABLETS BY MOUTH 3 TIMES DAILY AS NEEDED FOR MUSCLE SPASMS. 04/28/24   Marylynn Verneita CROME, MD  diphenhydrAMINE  (BENADRYL ) 25 MG tablet Take 25 mg by mouth every 6 (six) hours as needed for allergies.    [provider]  EPINEPHrine  0.3 mg/0.3 mL IJ SOAJ injection Inject 0.3 mg into the muscle as needed for anaphylaxis.  06/25/10   [provider]  escitalopram  (LEXAPRO ) 20 MG tablet Take 20 mg by mouth daily.  02/27/17   [provider]  ferrous sulfate  325 (65 FE) MG EC tablet Take 1 tablet (325 mg total) by mouth 2 (two) times daily. 10/18/23 10/17/24  Dorinda Drue DASEN, MD  fluticasone  (FLONASE ) 50 MCG/ACT nasal spray SPRAY 2 SPRAYS INTO EACH NOSTRIL EVERY DAY 04/21/21   Marylynn Verneita CROME, MD  furosemide  (LASIX ) 20 MG tablet Take 1 tablet (20 mg total) by mouth daily. As needed for fluid retention 03/17/24   Marylynn Verneita CROME, MD  HORIZANT  600 MG TBCR TAKE 1 TABLET BY MOUTH TWICE A DAY 04/28/24   Marylynn Verneita CROME, MD  insulin  aspart (NOVOLOG  FLEXPEN) 100 UNIT/ML FlexPen Inject 20 Units into the skin 3 (three) times daily with meals. INJECT 20 UNITS INTO THE SKIN 3 (THREE) TIMES DAILY WITH MEALS. 02/27/24   Marylynn Verneita CROME, MD  insulin  glargine (LANTUS  SOLOSTAR) 100 UNIT/ML Solostar Pen INJECT 45 UNITS INTO THE SKIN DAILY 06/02/24   Marylynn Verneita CROME, MD  Insulin  Pen Needle (BD PEN NEEDLE NANO U/F) 32G X 4 MM MISC Please specify directions, refills and quantity 05/31/22   Marylynn Verneita CROME, MD  lamoTRIgine  (LAMICTAL ) 100 MG tablet Take 100 mg by mouth 2 (two) times daily. 09/12/23   [provider]  levalbuterol  (XOPENEX  HFA) 45 MCG/ACT inhaler Inhale 1-2 puffs into the lungs every 6 (six) hours as needed for wheezing. 03/17/24   Marylynn Verneita CROME, MD  levothyroxine  (SYNTHROID ) 50 MCG tablet TAKE 1 TABLET BY MOUTH EVERY DAY 11/26/23   Marylynn Verneita CROME, MD  lipase/protease/amylase (CREON ) 36000 UNITS CPEP capsule TAKE 2 CAPSULES BY MOUTH 3 (THREE) TIMES DAILY BEFORE MEALS AND 1 CAPSULE WITH SNACKS. 05/14/23   [place], PA-C  LORazepam  (ATIVAN ) 0.5 MG tablet Take 1 tablet (0.5 mg total) by mouth daily as needed for anxiety. 05/16/23   Marylynn Verneita CROME, MD  losartan  (COZAAR ) 25 MG tablet Take 1 tablet (25 mg total) by mouth daily. 02/27/24 02/26/25  Marylynn Verneita CROME, MD  meclizine  (ANTIVERT ) 25 MG tablet Take 1 tablet (25 mg total) by mouth 3  (three) times daily as needed for dizziness. 06/16/15   Tullo, Teresa L, MD  Melatonin 10 MG TABS Take 10 mg by mouth at bedtime as needed (sleep).    [provider]  metoprolol  tartrate (LOPRESSOR ) 25 MG tablet Take 1 tablet (25 mg total) by mouth 2 (two) times daily. 03/20/24   Marylynn Verneita CROME, MD  nystatin  (MYCOSTATIN /NYSTOP ) powder APPLY 1 APPLICATION TO AFFECTED AREA TWICE A DAY 12/23/20   Maribeth Camellia MATSU, MD  OnabotulinumtoxinA (BOTOX IJ) Inject as directed every 3 (three) months. For migraine    [provider]  ondansetron  (ZOFRAN  ODT) 4 MG disintegrating tablet Take 1 tablet (4 mg total) by mouth every 8 (eight) hours as needed for nausea or vomiting. 10/03/21   Jessup, Charles, MD  polyethylene glycol powder (GLYCOLAX /MIRALAX ) 17 GM/SCOOP powder Mix 1 capful in a drink once daily for Constipation. 05/14/23   [place], PA-C    Family History Family History  Problem Relation Age of Onset   Diabetes Mother    Hypertension Mother    Hypertension Father    Diabetes Father    Depression Other        strong hx of   Breast cancer Paternal Grandmother 58   Breast cancer Maternal Grandmother    Cancer Neg Hx     Social History Social History   Tobacco Use   Smoking status: Former    Current packs/day: 0.00    Average packs/day: 1 pack/day for 4.0 years (4.0 ttl pk-yrs)    Types: Cigarettes    Start date: 02/05/1992    Quit date: 02/05/1996    Years since quitting: 28.3   Smokeless tobacco: Never  Vaping Use   Vaping status: Never Used  Substance Use Topics   Alcohol  use: No   Drug use: No     Allergies   Amoxicillin, Avocado, Citrullus vulgaris, Clarithromycin, Clarithromycin, Honey, Other, Sulfa  antibiotics, Sulfa  drugs cross reactors, Watermelon concentrate, Charentais melon (french melon), Insulin  detemir, Silicone, Tape, and Victoza  [liraglutide ]   Review of Systems Review of Systems  Constitutional:  Negative for chills and fever.   Gastrointestinal:  Negative for abdominal pain.  Genitourinary:  Positive for dysuria and frequency. Negative for flank pain, hematuria, pelvic pain and vaginal discharge.     Physical Exam Triage Vital Signs ED Triage Vitals  Encounter Vitals Group     BP      Girls Systolic BP Percentile      Girls Diastolic BP Percentile      Boys Systolic BP Percentile      Boys Diastolic BP Percentile      Pulse      Resp      Temp      Temp src      SpO2      Weight      Height      Head Circumference      Peak Flow      Pain Score      Pain Loc      Pain Education      Exclude from Growth Chart    No data found.  Updated Vital Signs BP 137/89   Pulse 85   Temp 98.7 F (37.1 C)   Resp 18   LMP 05/09/2016 (LMP Unknown) Comment: D/C  SpO2 95%   Visual Acuity Right Eye Distance:   Left Eye Distance:   Bilateral Distance:    Right Eye Near:   Left Eye Near:    Bilateral Near:     Physical Exam Constitutional:      General: She is not in acute distress. HENT:     Mouth/Throat:     Mouth: Mucous membranes are moist.   Cardiovascular:     Rate and Rhythm: Normal rate and regular rhythm.  Pulmonary:     Effort: Pulmonary effort is normal. No respiratory distress.  Abdominal:     Palpations: Abdomen is soft.     Tenderness: There is no abdominal tenderness. There is no right CVA tenderness, left CVA tenderness, guarding or rebound.   Neurological:     Mental Status: She is alert.      UC Treatments / Results  Labs (all labs ordered are listed, but only abnormal results are displayed) Labs Reviewed  POCT URINALYSIS DIP (MANUAL ENTRY) - Abnormal; Notable for the following components:      Result Value   Color, UA orange (*)    Clarity, UA cloudy (*)    Glucose, UA =100 (*)    Bilirubin, UA small (*)    Ketones, POC UA trace (5) (*)    Protein Ur, POC =100 (*)    Urobilinogen, UA 2.0 (*)    Nitrite, UA Positive (*)    Leukocytes, UA Large (3+) (*)    All  other components within normal limits  URINE CULTURE    EKG   Radiology No results found.  Procedures Procedures (including critical care time)  Medications Ordered in UC Medications - No data to display  Initial Impression / Assessment and Plan / UC Course  I have reviewed the triage vital signs and the nursing notes.  Pertinent labs & imaging results that were available during my care of the patient were reviewed by me and considered in my  medical decision making (see chart for details).    UTI without hematuria.  Treating with Keflex  which patient reports she has taken in the past without difficulty. Urine culture pending. Discussed with patient that we will call her if the urine culture shows the need to change or discontinue the antibiotic.  Caution patient to discontinue pyridium  due to her CKD.  Instructed her to follow-up with her PCP.  ED precautions given.  Patient agrees to plan of care.     Final Clinical Impressions(s) / UC Diagnoses   Final diagnoses:  Urinary tract infection without hematuria, site unspecified     Discharge Instructions      Take the antibiotic as directed.  The urine culture is pending.  We will call you if it shows the need to change or discontinue your antibiotic.    Follow up with your primary care provider tomorrow.  Go to the emergency department if you have worsening symptoms.        ED Prescriptions     Medication Sig Dispense Auth. Provider   cephALEXin  (KEFLEX ) 500 MG capsule Take 1 capsule (500 mg total) by mouth 3 (three) times daily for 5 days. 15 capsule Corlis Burnard DEL, NP      PDMP not reviewed this encounter.   Corlis Burnard DEL, NP 06/02/24 [phone]

## 2024-06-04 ENCOUNTER — Ambulatory Visit (HOSPITAL_COMMUNITY): Payer: Self-pay

## 2024-06-04 LAB — URINE CULTURE: Culture: >=100000 — AB

## 2024-06-09 ENCOUNTER — Ambulatory Visit (INDEPENDENT_AMBULATORY_CARE_PROVIDER_SITE_OTHER): Admitting: Internal Medicine

## 2024-06-09 ENCOUNTER — Encounter: Payer: Self-pay | Admitting: Internal Medicine

## 2024-06-09 VITALS — BP 130/70 | HR 88 | Ht 64.0 in | Wt 246.2 lb

## 2024-06-09 DIAGNOSIS — Z780 Asymptomatic menopausal state: Secondary | ICD-10-CM

## 2024-06-09 DIAGNOSIS — Z23 Encounter for immunization: Secondary | ICD-10-CM | POA: Diagnosis not present

## 2024-06-09 DIAGNOSIS — Z794 Long term (current) use of insulin: Secondary | ICD-10-CM

## 2024-06-09 DIAGNOSIS — E1122 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 disease: Secondary | ICD-10-CM

## 2024-06-09 DIAGNOSIS — M545 Low back pain, unspecified: Secondary | ICD-10-CM

## 2024-06-09 DIAGNOSIS — R300 Dysuria: Secondary | ICD-10-CM

## 2024-06-09 DIAGNOSIS — I10 Essential (primary) hypertension: Secondary | ICD-10-CM

## 2024-06-09 DIAGNOSIS — E782 Mixed hyperlipidemia: Secondary | ICD-10-CM | POA: Diagnosis not present

## 2024-06-09 DIAGNOSIS — M47812 Spondylosis without myelopathy or radiculopathy, cervical region: Secondary | ICD-10-CM

## 2024-06-09 DIAGNOSIS — N182 Chronic kidney disease, stage 2 (mild): Secondary | ICD-10-CM

## 2024-06-09 LAB — POCT URINALYSIS DIPSTICK
Glucose, UA: NEGATIVE
Ketones, UA: NEGATIVE
Leukocytes, UA: NEGATIVE
Nitrite, UA: NEGATIVE
Protein, UA: POSITIVE — AB
Spec Grav, UA: 1.025 (ref 1.010–1.025)
Urobilinogen, UA: 1 U/dL
pH, UA: 5.5 (ref 5.0–8.0)

## 2024-06-09 MED ORDER — LOSARTAN POTASSIUM 25 MG PO TABS: 25.0000 mg | ORAL_TABLET | Freq: Every day | ORAL | 1 refills | Status: DC

## 2024-06-09 MED ORDER — LEVOTHYROXINE SODIUM 50 MCG PO TABS: 50.0000 ug | ORAL_TABLET | Freq: Every day | ORAL | 1 refills | Status: AC

## 2024-06-09 MED ORDER — TRAMADOL HCL 50 MG PO TABS: 50.0000 mg | ORAL_TABLET | Freq: Four times a day (QID) | ORAL | 0 refills | Status: DC | PRN

## 2024-06-09 NOTE — Patient Instructions (Addendum)
  You can TAKE  up to 3000 mg of acetominophen (tylenol ) every day safely  In divided doses   LIMIT YOUR EXCEDRIN  TO ONCE DAILY BECAUSE EACH DOSE HAS 250 MG ASPIRIN   I HAVE ADDED TRAMADOL  FOR PAIN CONTROL.  REFILLS ARE ALLOWED IF YOU WILL LET ME KNOW HOW OFTEN YOU NEED TO TAKE A DOSE  MRI LUMBAR SPINE ORDERED    REMIND ME TO REVIEW BLOOD SUGARS NEXT WEEK (POST UTI)

## 2024-06-09 NOTE — Progress Notes (Unsigned)
 Subjective:  Patient ID: Maria Bentley, female    DOB: [birth year]  Age: 53 y.o. MRN: [id number]  CC: The primary encounter diagnosis was Primary hypertension. Diagnoses of Type 2 diabetes mellitus with stage 2 chronic kidney disease, without long-term current use of insulin  (HCC), Mixed hyperlipidemia, and Dysuria were also pertinent to this visit.   HPI Maria Bentley presents for  Chief Complaint  Patient presents with   Medical Management of Chronic Issues    SEEN VIRTUALLY IN APRIL AFTER ER VISIT:    PFT's ordered to evaluate the shortness of  breath  NOT DONE    ECHO to evaluate the left ventricular function   NOTE DONE    CHRONIC NECK AND BACK PAIN ,  NO LONGER GETTING MASSAGE THERAPY. SEEING A CHIROPRACTOR.   HAS PAIN RADIATING TO BOTH LEGS  RIGHT  >>. LEG,  AND RIGHT SHOULDER  HEATH.  She has noted decreased grip decreases strength in right arm. Has not had falls in the last year  but stumbles frequently. Taking tylenol  and excedrin     1) DIABETES CONTROL HAD SLIPPED  . Taking  40 units lantus   and using mealtime insulin   dose to 15 units ,  20 units if a starch meal.  Using dexcom cbg monitor.  I have downloaded and reviewed the data from patient's continuous blood glucose monitor  for  the period  of    Jun 17 to Jun 30  Patient's  sugars have been  IN RANGE   42  % OF THE TIME,   ABOVE RANGE  58  % of the time.  And  BELOW  RANGE  0 % OF THE TIME .  Readings were taken during UTI>  Patient reports compliance with medication approximately  80  % of the time.   Exam notable for right foot weakness  and decreased reflexes.  MRI LUMBAR SPINE DISCUSSED . USING TYLENOL  AND EXCEDRIN  MIGRAINE    Lab Results  Component Value Date   HGBA1C 7.9 (H) 03/17/2024   2) TREATED FOR UTI BY URGENT CARE  COMPLETED ABX 48 HOURS AGO.  STILL SYMPTOMATIC       Outpatient Medications Prior to Visit  Medication Sig Dispense Refill   acetaminophen  (TYLENOL ) 325 MG tablet Take  2 tablets (650 mg total) by mouth every 6 (six) hours as needed for mild pain (pain score 1-3) (or Fever >/= 101). 20 tablet 0   amitriptyline  (ELAVIL ) 50 MG tablet TAKE 1 TABLET BY MOUTH EVERYDAY AT BEDTIME (Patient taking differently: Take 50 mg by mouth at bedtime.) 90 tablet 3   aspirin -acetaminophen -caffeine  (EXCEDRIN  MIGRAINE) 250-250-65 MG tablet Take 2 tablets by mouth every 6 (six) hours as needed for headache.     atorvastatin  (LIPITOR) 20 MG tablet Take 1 tablet (20 mg total) by mouth daily. 90 tablet 1   Azelastine  HCl 137 MCG/SPRAY SOLN      BD INTEGRA SYRINGE 25G X 1 3 ML MISC FOR USE WITH B12 INJECTIONS WEEKLY/MONTHLY 12 each 2   buPROPion  (WELLBUTRIN  XL) 150 MG 24 hr tablet Take 450 mg by mouth daily.   3   calcium  carbonate (OSCAL) 1500 (600 Ca) MG TABS tablet Take 600 mg of elemental calcium  by mouth daily with breakfast.     cholecalciferol (VITAMIN D3) 25 MCG (1000 UNIT) tablet Take 5,000 Units by mouth daily.     CLINPRO 5000 1.1 % PSTE Place 1 application onto teeth at bedtime.      Continuous Glucose  Sensor (DEXCOM G7 SENSOR) MISC APPLY 1 SENSOR EVERY 10 DAYS 3 each 1   cyanocobalamin  (VITAMIN B12) 1000 MCG/ML injection INJECT 1 ML INTO THE MUSCLE EVERY 30 DAYS. INJECT 1 ML INTRAMUSCULARLY ONCE A WEEK FOR 4 WEEKS AND THEN ONCE A MONTH THEREAFTER. 12 mL 1   cyclobenzaprine  (FLEXERIL ) 10 MG tablet TAKE 0.5-1 TABLETS BY MOUTH 3 TIMES DAILY AS NEEDED FOR MUSCLE SPASMS. 30 tablet 12   diphenhydrAMINE  (BENADRYL ) 25 MG tablet Take 25 mg by mouth every 6 (six) hours as needed for allergies.     EPINEPHrine  0.3 mg/0.3 mL IJ SOAJ injection Inject 0.3 mg into the muscle as needed for anaphylaxis.      escitalopram  (LEXAPRO ) 20 MG tablet Take 20 mg by mouth daily.      fluticasone  (FLONASE ) 50 MCG/ACT nasal spray SPRAY 2 SPRAYS INTO EACH NOSTRIL EVERY DAY 48 mL 1   furosemide  (LASIX ) 20 MG tablet Take 1 tablet (20 mg total) by mouth daily. As needed for fluid retention 90 tablet 1    HORIZANT  600 MG TBCR TAKE 1 TABLET BY MOUTH TWICE A DAY 60 tablet 5   insulin  aspart (NOVOLOG  FLEXPEN) 100 UNIT/ML FlexPen Inject 20 Units into the skin 3 (three) times daily with meals. INJECT 20 UNITS INTO THE SKIN 3 (THREE) TIMES DAILY WITH MEALS. 30 mL 1   insulin  glargine (LANTUS  SOLOSTAR) 100 UNIT/ML Solostar Pen INJECT 45 UNITS INTO THE SKIN DAILY 15 mL 3   insulin  glargine-yfgn (SEMGLEE ) 100 UNIT/ML Pen 45 Unit(s) SUB-Q Daily     Insulin  Pen Needle (BD PEN NEEDLE NANO U/F) 32G X 4 MM MISC Please specify directions, refills and quantity 100 each 11   lamoTRIgine  (LAMICTAL ) 100 MG tablet Take 100 mg by mouth 2 (two) times daily.     levalbuterol  (XOPENEX  HFA) 45 MCG/ACT inhaler Inhale 1-2 puffs into the lungs every 6 (six) hours as needed for wheezing. 15 g 12   levothyroxine  (SYNTHROID ) 50 MCG tablet TAKE 1 TABLET BY MOUTH EVERY DAY 90 tablet 1   lipase/protease/amylase (CREON ) 36000 UNITS CPEP capsule TAKE 2 CAPSULES BY MOUTH 3 (THREE) TIMES DAILY BEFORE MEALS AND 1 CAPSULE WITH SNACKS. 300 capsule 12   LORazepam  (ATIVAN ) 0.5 MG tablet Take 1 tablet (0.5 mg total) by mouth daily as needed for anxiety. 30 tablet 0   losartan  (COZAAR ) 25 MG tablet Take 1 tablet (25 mg total) by mouth daily. 90 tablet 1   meclizine  (ANTIVERT ) 25 MG tablet Take 1 tablet (25 mg total) by mouth 3 (three) times daily as needed for dizziness. 90 tablet 6   Melatonin 10 MG TABS Take 10 mg by mouth at bedtime as needed (sleep).     metoprolol  tartrate (LOPRESSOR ) 25 MG tablet Take 1 tablet (25 mg total) by mouth 2 (two) times daily. 180 tablet 1   nystatin  (MYCOSTATIN /NYSTOP ) powder APPLY 1 APPLICATION TO AFFECTED AREA TWICE A DAY 60 g 0   OnabotulinumtoxinA (BOTOX IJ) Inject as directed every 3 (three) months. For migraine     polyethylene glycol powder (GLYCOLAX /MIRALAX ) 17 GM/SCOOP powder Mix 1 capful in a drink once daily for Constipation. 255 g 3   albuterol  (VENTOLIN  HFA) 108 (90 Base) MCG/ACT inhaler Inhale 2  puffs into the lungs every 6 (six) hours as needed for wheezing or shortness of breath. 18 g 0   albuterol  (VENTOLIN  HFA) 108 (90 Base) MCG/ACT inhaler Inhale 2 puffs into the lungs every 6 (six) hours as needed for wheezing or shortness of breath. 8  g 0   ferrous sulfate  325 (65 FE) MG EC tablet Take 1 tablet (325 mg total) by mouth 2 (two) times daily. 60 tablet 3   ondansetron  (ZOFRAN  ODT) 4 MG disintegrating tablet Take 1 tablet (4 mg total) by mouth every 8 (eight) hours as needed for nausea or vomiting. 12 tablet 0   No facility-administered medications prior to visit.    Review of Systems;  Patient denies headache, fevers, malaise, unintentional weight loss, skin rash, eye pain, sinus congestion and sinus pain, sore throat, dysphagia,  hemoptysis , cough, dyspnea, wheezing, chest pain, palpitations, orthopnea, edema, abdominal pain, nausea, melena, diarrhea, constipation, flank pain, dysuria, hematuria, urinary  Frequency, nocturia, numbness, tingling, seizures,  Focal weakness, Loss of consciousness,  Tremor, insomnia, depression, anxiety, and suicidal ideation.      Objective:  BP 132/70   Pulse 88   Ht 5' 4 (1.626 m)   Wt 246 lb 3.2 oz (111.7 kg)   LMP 05/09/2016 (LMP Unknown) Comment: D/C  SpO2 94%   BMI 42.26 kg/m   BP Readings from Last 3 Encounters:  06/09/24 132/70  06/02/24 137/89  03/17/24 128/72    Wt Readings from Last 3 Encounters:  06/09/24 246 lb 3.2 oz (111.7 kg)  03/17/24 257 lb 3.2 oz (116.7 kg)  03/13/24 230 lb (104.3 kg)    Physical Exam  Lab Results  Component Value Date   HGBA1C 7.9 (H) 03/17/2024   HGBA1C 6.7 (H) 10/16/2023   HGBA1C 7.2 (A) 10/15/2023    Lab Results  Component Value Date   CREATININE 1.26 (H) 03/17/2024   CREATININE 1.15 (H) 03/13/2024   CREATININE 1.14 (H) 10/17/2023    Lab Results  Component Value Date   WBC 9.4 03/17/2024   HGB 10.6 (L) 03/17/2024   HCT 35.3 (L) 03/17/2024   PLT 534.0 (H) 03/17/2024   GLUCOSE  115 (H) 03/17/2024   CHOL 120 03/17/2024   TRIG 111 03/17/2024   HDL 40 (L) 03/17/2024   LDLDIRECT 53.0 10/15/2023   LDLCALC 61 03/17/2024   ALT 29 03/17/2024   AST 22 03/17/2024   NA 142 03/17/2024   K 4.8 03/17/2024   CL 108 03/17/2024   CREATININE 1.26 (H) 03/17/2024   BUN 20 03/17/2024   CO2 26 03/17/2024   TSH 1.55 03/17/2024   INR 0.9 10/25/2019   HGBA1C 7.9 (H) 03/17/2024   MICROALBUR 1.8 03/17/2024    No results found.  Assessment & Plan:  .Primary hypertension  Type 2 diabetes mellitus with stage 2 chronic kidney disease, without long-term current use of insulin  (HCC)  Mixed hyperlipidemia  Dysuria     I spent 34 minutes on the day of this face to face encounter reviewing patient's  most recent visit with cardiology,  nephrology,  and neurology,  prior relevant surgical and non surgical procedures, recent  labs and imaging studies, counseling on weight management,  reviewing the assessment and plan with patient, and post visit ordering and reviewing of  diagnostics and therapeutics with patient  .   Follow-up: No follow-ups on file.   Verneita LITTIE Kettering, MD

## 2024-06-10 ENCOUNTER — Ambulatory Visit: Payer: Self-pay | Admitting: Internal Medicine

## 2024-06-10 LAB — URINE CULTURE
MICRO NUMBER:: 16640676
Result:: NO GROWTH
SPECIMEN QUALITY:: ADEQUATE

## 2024-06-10 LAB — URINALYSIS, MICROSCOPIC ONLY

## 2024-06-10 NOTE — Assessment & Plan Note (Signed)
 Last 2 weeks of BS are elevated and attributed to UTI which went untreated for several weeks.  No changes to current regimen   will repeat assessment in 2 weeks with download of Dexcom data

## 2024-06-10 NOTE — Assessment & Plan Note (Signed)
 Exam concerning for right leg weakness suggesting myelopathy.  MRI LUMBAR SPINE DISCUSSED . USING TYLENOL  AND EXCEDRIN  MIGRAINE

## 2024-06-10 NOTE — Assessment & Plan Note (Signed)
  Exam notable for right foot weakness  and decreased reflexes but normal upper body strength .

## 2024-06-23 ENCOUNTER — Other Ambulatory Visit: Payer: Self-pay | Admitting: Physician Assistant

## 2024-06-24 ENCOUNTER — Telehealth: Payer: Self-pay

## 2024-06-24 NOTE — Telephone Encounter (Signed)
 I called the pt to schedule a follow up visit before refilling her Creon  due to her last visit being 05/14/23. The last note stated that she needs to follow up in year but she does not have a follow up appointment. Patient did not answer and a voicemail was let asking her to return my call.

## 2024-06-26 ENCOUNTER — Encounter: Payer: Self-pay | Admitting: Internal Medicine

## 2024-06-27 ENCOUNTER — Encounter: Payer: Self-pay | Admitting: Internal Medicine

## 2024-06-28 ENCOUNTER — Other Ambulatory Visit: Payer: Self-pay | Admitting: Internal Medicine

## 2024-06-30 ENCOUNTER — Other Ambulatory Visit: Payer: Self-pay | Admitting: Internal Medicine

## 2024-06-30 DIAGNOSIS — M797 Fibromyalgia: Secondary | ICD-10-CM

## 2024-06-30 DIAGNOSIS — G8929 Other chronic pain: Secondary | ICD-10-CM | POA: Insufficient documentation

## 2024-06-30 DIAGNOSIS — M25551 Pain in right hip: Secondary | ICD-10-CM

## 2024-06-30 DIAGNOSIS — M5412 Radiculopathy, cervical region: Secondary | ICD-10-CM

## 2024-06-30 MED ORDER — TRAMADOL HCL 50 MG PO TABS
50.0000 mg | ORAL_TABLET | Freq: Two times a day (BID) | ORAL | 2 refills | Status: DC | PRN
Start: 2024-06-30 — End: 2024-07-15

## 2024-06-30 NOTE — Assessment & Plan Note (Signed)
 Trial of tramadol  was helpful in relieving her FM pain as well as her neck and hip pain and allowed to rest better at night.  Will refill tramadol  monthly. #60 tablets

## 2024-06-30 NOTE — Assessment & Plan Note (Signed)
 Secondary to multiple orthopedic issues as well as FM.  Continue use of tramadol  twice daily for pain management

## 2024-07-13 ENCOUNTER — Encounter: Payer: Self-pay | Admitting: Internal Medicine

## 2024-07-15 ENCOUNTER — Other Ambulatory Visit: Payer: Self-pay | Admitting: Internal Medicine

## 2024-07-15 DIAGNOSIS — M5412 Radiculopathy, cervical region: Secondary | ICD-10-CM

## 2024-07-15 DIAGNOSIS — M797 Fibromyalgia: Secondary | ICD-10-CM

## 2024-07-15 DIAGNOSIS — M25551 Pain in right hip: Secondary | ICD-10-CM

## 2024-07-15 MED ORDER — TRAMADOL HCL 50 MG PO TABS: 50.0000 mg | ORAL_TABLET | Freq: Two times a day (BID) | ORAL | 2 refills | Status: DC | PRN

## 2024-07-15 MED ORDER — ESTRADIOL 0.1 MG/GM VA CREA: 1.0000 | TOPICAL_CREAM | Freq: Every day | VAGINAL | 0 refills | Status: AC

## 2024-07-15 NOTE — Telephone Encounter (Signed)
 Copied from CRM #[id number]. Topic: Clinical - Medication Question >> Jul 15, 2024  3:55 PM Henretta I wrote: Reason for CRM: Patient has been sending in updates on medication via MyChart starting over a week ago and has gotten no response. Please have Nurse or CMA to give patient a callback asap to discuss.

## 2024-07-16 ENCOUNTER — Other Ambulatory Visit (HOSPITAL_COMMUNITY): Payer: Self-pay

## 2024-07-16 ENCOUNTER — Encounter: Payer: Self-pay | Admitting: Oncology

## 2024-07-16 ENCOUNTER — Telehealth: Payer: Self-pay

## 2024-07-16 NOTE — Telephone Encounter (Signed)
 Pharmacy Patient Advocate Encounter  Received notification from [facility] that Prior Authorization for Tramadol  50 has been APPROVED from 07/16/24 to 07/16/25. Unable to obtain price due to refill too soon rejection, last fill date 07/16/24 next available fill date8/30/25   PA #/Case ID/Reference #: [id number]

## 2024-07-16 NOTE — Telephone Encounter (Signed)
 Pharmacy Patient Advocate Encounter   Received notification from Onbase that prior authorization for Ultram  50 is required/requested.   Insurance verification completed.   The patient is insured through [facility] .   Per test claim: unable to run test claim, rx is DAW 1 and [facility] unable to run like [facility]. PA request submitted, CMM key:[id number]

## 2024-08-05 ENCOUNTER — Encounter: Admitting: Urology

## 2024-08-05 ENCOUNTER — Other Ambulatory Visit: Payer: Self-pay

## 2024-08-05 DIAGNOSIS — Z87440 Personal history of urinary (tract) infections: Secondary | ICD-10-CM

## 2024-08-08 ENCOUNTER — Encounter: Payer: Self-pay | Admitting: Urology

## 2024-08-21 ENCOUNTER — Other Ambulatory Visit: Payer: Self-pay | Admitting: Internal Medicine

## 2024-08-26 ENCOUNTER — Ambulatory Visit: Admitting: Internal Medicine

## 2024-08-29 ENCOUNTER — Other Ambulatory Visit: Payer: Self-pay | Admitting: Internal Medicine

## 2024-08-30 ENCOUNTER — Other Ambulatory Visit: Payer: Self-pay | Admitting: Internal Medicine

## 2024-09-04 ENCOUNTER — Other Ambulatory Visit: Payer: Self-pay

## 2024-09-04 DIAGNOSIS — R319 Hematuria, unspecified: Secondary | ICD-10-CM

## 2024-09-05 ENCOUNTER — Encounter: Admitting: Urology

## 2024-09-05 DIAGNOSIS — R3912 Poor urinary stream: Secondary | ICD-10-CM

## 2024-09-11 ENCOUNTER — Other Ambulatory Visit: Payer: Self-pay | Admitting: Medical Genetics

## 2024-09-12 ENCOUNTER — Other Ambulatory Visit: Payer: Self-pay | Admitting: Internal Medicine

## 2024-09-18 ENCOUNTER — Encounter: Admitting: Urology

## 2024-09-18 DIAGNOSIS — R3912 Poor urinary stream: Secondary | ICD-10-CM

## 2024-09-22 ENCOUNTER — Ambulatory Visit: Admitting: Internal Medicine

## 2024-09-25 ENCOUNTER — Encounter: Payer: Self-pay | Admitting: Internal Medicine

## 2024-09-26 MED ORDER — NOVOLOG FLEXPEN 100 UNIT/ML ~~LOC~~ SOPN: 25.0000 [IU] | PEN_INJECTOR | Freq: Three times a day (TID) | SUBCUTANEOUS | 1 refills | Status: AC

## 2024-09-26 NOTE — Telephone Encounter (Signed)
**Note De-identified  [place] Obfuscation** Please advise 

## 2024-09-26 NOTE — Telephone Encounter (Signed)
 Quantity increased to provide 90 day coverage  at max dose of 25 units three times daily, that means she needs 7.5 pens per month or 22.5 /90 days

## 2024-09-29 ENCOUNTER — Other Ambulatory Visit

## 2024-09-30 ENCOUNTER — Encounter: Admitting: Urology

## 2024-10-01 ENCOUNTER — Other Ambulatory Visit

## 2024-10-02 ENCOUNTER — Ambulatory Visit: Admitting: Nurse Practitioner

## 2024-10-09 ENCOUNTER — Ambulatory Visit: Admitting: Urology

## 2024-10-09 ENCOUNTER — Encounter: Payer: Self-pay | Admitting: Urology

## 2024-10-16 ENCOUNTER — Other Ambulatory Visit

## 2024-10-17 ENCOUNTER — Other Ambulatory Visit: Payer: Self-pay | Admitting: Internal Medicine

## 2024-10-19 ENCOUNTER — Other Ambulatory Visit: Payer: Self-pay | Admitting: Internal Medicine

## 2024-10-19 DIAGNOSIS — E782 Mixed hyperlipidemia: Secondary | ICD-10-CM

## 2024-10-20 ENCOUNTER — Other Ambulatory Visit

## 2024-10-22 ENCOUNTER — Other Ambulatory Visit

## 2024-11-04 ENCOUNTER — Ambulatory Visit: Admitting: Internal Medicine

## 2024-11-10 ENCOUNTER — Other Ambulatory Visit: Payer: Self-pay | Admitting: Internal Medicine

## 2024-11-13 ENCOUNTER — Ambulatory Visit: Admitting: Internal Medicine

## 2024-11-27 ENCOUNTER — Other Ambulatory Visit: Payer: Self-pay | Admitting: Internal Medicine

## 2024-11-30 ENCOUNTER — Other Ambulatory Visit: Payer: Self-pay | Admitting: Internal Medicine

## 2024-11-30 DIAGNOSIS — M5412 Radiculopathy, cervical region: Secondary | ICD-10-CM

## 2024-11-30 DIAGNOSIS — M797 Fibromyalgia: Secondary | ICD-10-CM

## 2024-11-30 DIAGNOSIS — M25551 Pain in right hip: Secondary | ICD-10-CM

## 2024-12-01 NOTE — Telephone Encounter (Signed)
 Refilled: 07/15/2024 Last OV: 06/09/2024 Next OV: 01/01/2025  Spoke with pt and she stated that she has enough medication to  get her through until Dr. Marylynn returns to the office Monday.

## 2024-12-16 ENCOUNTER — Encounter: Admitting: Urology

## 2024-12-22 ENCOUNTER — Ambulatory Visit: Admitting: Internal Medicine

## 2024-12-24 ENCOUNTER — Other Ambulatory Visit: Payer: Self-pay | Admitting: Internal Medicine

## 2024-12-25 ENCOUNTER — Other Ambulatory Visit: Payer: Self-pay | Admitting: Internal Medicine

## 2024-12-25 ENCOUNTER — Encounter: Payer: Self-pay | Admitting: Internal Medicine

## 2024-12-26 ENCOUNTER — Encounter: Payer: Self-pay | Admitting: Oncology

## 2024-12-26 ENCOUNTER — Other Ambulatory Visit (HOSPITAL_COMMUNITY): Payer: Self-pay

## 2024-12-31 ENCOUNTER — Encounter: Payer: Self-pay | Admitting: Internal Medicine

## 2025-01-01 ENCOUNTER — Ambulatory Visit: Admitting: Internal Medicine

## 2025-01-01 ENCOUNTER — Telehealth: Payer: Self-pay | Admitting: Internal Medicine

## 2025-01-01 ENCOUNTER — Encounter: Payer: Self-pay | Admitting: Internal Medicine

## 2025-01-01 ENCOUNTER — Encounter: Payer: Self-pay | Admitting: Oncology

## 2025-01-01 NOTE — Telephone Encounter (Signed)
 We find it necessary to inform you that [facility] and [facility]/Providers will no longer be able to provide medical care to you. We believe that our patient/provider relationship has been compromised and thus would request that you seek care elsewhere.  Repeated No Shows 01/01/25

## 2025-01-01 NOTE — Telephone Encounter (Signed)
 The provider has requested dismissal of the patient because of repeated no-shows. A dismissal letter was sent via MyChart, and a copy will be sent to HIM for mailing to the patient.

## 2025-01-02 ENCOUNTER — Telehealth: Payer: Self-pay

## 2025-01-02 ENCOUNTER — Other Ambulatory Visit (HOSPITAL_COMMUNITY): Payer: Self-pay

## 2025-01-02 NOTE — Telephone Encounter (Signed)
 PA canceled. Patient dismissed from clinic.

## 2025-01-06 ENCOUNTER — Other Ambulatory Visit: Payer: Self-pay | Admitting: Medical Genetics

## 2025-01-06 DIAGNOSIS — Z006 Encounter for examination for normal comparison and control in clinical research program: Secondary | ICD-10-CM

## 2025-01-08 ENCOUNTER — Other Ambulatory Visit: Payer: Self-pay | Admitting: Internal Medicine

## 2026-01-08 ENCOUNTER — Encounter: Admitting: Internal Medicine
